# Patient Record
Sex: Female | Born: 1940 | Race: White | Hispanic: No | Marital: Married | Smoking: Former smoker
Health system: Southern US, Community
[De-identification: ages and names within clinical notes are randomized; demographics above are authoritative.]

## PROBLEM LIST (undated history)

## (undated) DIAGNOSIS — K5730 Diverticulosis of large intestine without perforation or abscess without bleeding: Secondary | ICD-10-CM

## (undated) DIAGNOSIS — K449 Diaphragmatic hernia without obstruction or gangrene: Secondary | ICD-10-CM

## (undated) DIAGNOSIS — M879 Osteonecrosis, unspecified: Secondary | ICD-10-CM

## (undated) DIAGNOSIS — R911 Solitary pulmonary nodule: Secondary | ICD-10-CM

## (undated) DIAGNOSIS — C3490 Malignant neoplasm of unspecified part of unspecified bronchus or lung: Secondary | ICD-10-CM

## (undated) DIAGNOSIS — A310 Pulmonary mycobacterial infection: Secondary | ICD-10-CM

## (undated) DIAGNOSIS — M797 Fibromyalgia: Secondary | ICD-10-CM

## (undated) DIAGNOSIS — Z8719 Personal history of other diseases of the digestive system: Secondary | ICD-10-CM

## (undated) DIAGNOSIS — K2970 Gastritis, unspecified, without bleeding: Secondary | ICD-10-CM

## (undated) DIAGNOSIS — K56609 Unspecified intestinal obstruction, unspecified as to partial versus complete obstruction: Secondary | ICD-10-CM

## (undated) DIAGNOSIS — Z800 Family history of malignant neoplasm of digestive organs: Secondary | ICD-10-CM

## (undated) DIAGNOSIS — F419 Anxiety disorder, unspecified: Secondary | ICD-10-CM

## (undated) DIAGNOSIS — M1990 Unspecified osteoarthritis, unspecified site: Secondary | ICD-10-CM

## (undated) DIAGNOSIS — I639 Cerebral infarction, unspecified: Secondary | ICD-10-CM

## (undated) DIAGNOSIS — F5104 Psychophysiologic insomnia: Secondary | ICD-10-CM

## (undated) DIAGNOSIS — K5792 Diverticulitis of intestine, part unspecified, without perforation or abscess without bleeding: Secondary | ICD-10-CM

## (undated) DIAGNOSIS — F32A Depression, unspecified: Secondary | ICD-10-CM

## (undated) DIAGNOSIS — B009 Herpesviral infection, unspecified: Secondary | ICD-10-CM

## (undated) DIAGNOSIS — K648 Other hemorrhoids: Secondary | ICD-10-CM

## (undated) DIAGNOSIS — K219 Gastro-esophageal reflux disease without esophagitis: Secondary | ICD-10-CM

## (undated) DIAGNOSIS — R002 Palpitations: Secondary | ICD-10-CM

## (undated) DIAGNOSIS — Q613 Polycystic kidney, unspecified: Secondary | ICD-10-CM

## (undated) DIAGNOSIS — M8580 Other specified disorders of bone density and structure, unspecified site: Secondary | ICD-10-CM

## (undated) DIAGNOSIS — N189 Chronic kidney disease, unspecified: Secondary | ICD-10-CM

## (undated) DIAGNOSIS — T07XXXA Unspecified multiple injuries, initial encounter: Secondary | ICD-10-CM

## (undated) DIAGNOSIS — E785 Hyperlipidemia, unspecified: Secondary | ICD-10-CM

## (undated) DIAGNOSIS — K589 Irritable bowel syndrome without diarrhea: Secondary | ICD-10-CM

## (undated) DIAGNOSIS — N3010 Interstitial cystitis (chronic) without hematuria: Secondary | ICD-10-CM

## (undated) DIAGNOSIS — M8700 Idiopathic aseptic necrosis of unspecified bone: Secondary | ICD-10-CM

## (undated) DIAGNOSIS — I10 Essential (primary) hypertension: Secondary | ICD-10-CM

## (undated) DIAGNOSIS — M810 Age-related osteoporosis without current pathological fracture: Secondary | ICD-10-CM

## (undated) HISTORY — DX: Irritable bowel syndrome, unspecified: K58.9

## (undated) HISTORY — DX: Age-related osteoporosis without current pathological fracture: M81.0

## (undated) HISTORY — DX: Fibromyalgia: M79.7

## (undated) HISTORY — DX: Interstitial cystitis (chronic) without hematuria: N30.10

## (undated) HISTORY — DX: Family history of malignant neoplasm of digestive organs: Z80.0

## (undated) HISTORY — DX: Essential (primary) hypertension: I10

## (undated) HISTORY — DX: Gastro-esophageal reflux disease without esophagitis: K21.9

## (undated) HISTORY — DX: Malignant neoplasm of unspecified part of unspecified bronchus or lung: C34.90

## (undated) HISTORY — PX: TOTAL HIP ARTHROPLASTY: SHX124

## (undated) HISTORY — DX: Unspecified intestinal obstruction, unspecified as to partial versus complete obstruction: K56.609

## (undated) HISTORY — DX: Hyperlipidemia, unspecified: E78.5

## (undated) HISTORY — DX: Other hemorrhoids: K64.8

## (undated) HISTORY — DX: Osteonecrosis, unspecified: M87.9

## (undated) HISTORY — DX: Personal history of other diseases of the digestive system: Z87.19

## (undated) HISTORY — DX: Cerebral infarction, unspecified: I63.9

## (undated) HISTORY — DX: Gastritis, unspecified, without bleeding: K29.70

## (undated) HISTORY — DX: Unspecified multiple injuries, initial encounter: T07.XXXA

## (undated) HISTORY — DX: Psychophysiologic insomnia: F51.04

## (undated) HISTORY — DX: Herpesviral infection, unspecified: B00.9

## (undated) HISTORY — DX: Diaphragmatic hernia without obstruction or gangrene: K44.9

## (undated) HISTORY — DX: Diverticulosis of large intestine without perforation or abscess without bleeding: K57.30

## (undated) HISTORY — PX: PELVIC LAPAROSCOPY: SHX162

## (undated) HISTORY — DX: Other specified disorders of bone density and structure, unspecified site: M85.80

## (undated) HISTORY — DX: Palpitations: R00.2

## (undated) HISTORY — DX: Polycystic kidney, unspecified: Q61.3

## (undated) HISTORY — PX: OTHER SURGICAL HISTORY: SHX169

## (undated) HISTORY — DX: Solitary pulmonary nodule: R91.1

## (undated) HISTORY — PX: CHOLECYSTECTOMY: SHX55

---

## 1992-08-01 HISTORY — PX: ABDOMINAL HYSTERECTOMY: SHX81

## 1992-08-01 HISTORY — PX: OOPHORECTOMY: SHX86

## 1998-12-21 ENCOUNTER — Encounter: Payer: Self-pay | Admitting: Gastroenterology

## 1998-12-21 ENCOUNTER — Other Ambulatory Visit: Admission: RE | Admit: 1998-12-21 | Discharge: 1998-12-21 | Payer: Self-pay | Admitting: Gastroenterology

## 1999-10-06 ENCOUNTER — Encounter (INDEPENDENT_AMBULATORY_CARE_PROVIDER_SITE_OTHER): Payer: Self-pay | Admitting: Specialist

## 1999-10-06 ENCOUNTER — Encounter: Payer: Self-pay | Admitting: Gastroenterology

## 1999-10-06 ENCOUNTER — Other Ambulatory Visit: Admission: RE | Admit: 1999-10-06 | Discharge: 1999-10-06 | Payer: Self-pay | Admitting: Gastroenterology

## 2004-12-01 ENCOUNTER — Ambulatory Visit: Payer: Self-pay | Admitting: Internal Medicine

## 2004-12-02 ENCOUNTER — Ambulatory Visit (HOSPITAL_COMMUNITY): Admission: RE | Admit: 2004-12-02 | Discharge: 2004-12-02 | Payer: Self-pay | Admitting: Internal Medicine

## 2005-04-12 ENCOUNTER — Ambulatory Visit: Payer: Self-pay | Admitting: Internal Medicine

## 2005-08-31 ENCOUNTER — Encounter: Payer: Self-pay | Admitting: Internal Medicine

## 2005-08-31 LAB — CONVERTED CEMR LAB

## 2005-09-29 ENCOUNTER — Ambulatory Visit: Payer: Self-pay | Admitting: Internal Medicine

## 2005-10-18 ENCOUNTER — Ambulatory Visit: Payer: Self-pay | Admitting: Internal Medicine

## 2005-10-28 ENCOUNTER — Ambulatory Visit: Payer: Self-pay | Admitting: Internal Medicine

## 2005-11-02 ENCOUNTER — Ambulatory Visit: Payer: Self-pay | Admitting: Internal Medicine

## 2005-11-26 ENCOUNTER — Ambulatory Visit: Payer: Self-pay | Admitting: Family Medicine

## 2005-12-15 ENCOUNTER — Ambulatory Visit: Payer: Self-pay | Admitting: Internal Medicine

## 2005-12-29 ENCOUNTER — Ambulatory Visit: Payer: Self-pay | Admitting: Internal Medicine

## 2005-12-29 ENCOUNTER — Encounter: Payer: Self-pay | Admitting: Gastroenterology

## 2006-01-02 ENCOUNTER — Ambulatory Visit: Payer: Self-pay | Admitting: Internal Medicine

## 2006-01-02 ENCOUNTER — Ambulatory Visit (HOSPITAL_COMMUNITY): Admission: RE | Admit: 2006-01-02 | Discharge: 2006-01-02 | Payer: Self-pay | Admitting: Internal Medicine

## 2006-02-06 ENCOUNTER — Ambulatory Visit: Payer: Self-pay | Admitting: Internal Medicine

## 2006-05-26 ENCOUNTER — Ambulatory Visit: Payer: Self-pay | Admitting: Internal Medicine

## 2006-05-26 LAB — CONVERTED CEMR LAB
ALT: 17 units/L (ref 0–40)
AST: 18 units/L (ref 0–37)
Albumin: 3.6 g/dL (ref 3.5–5.2)
Alkaline Phosphatase: 60 units/L (ref 39–117)
Bilirubin, Direct: 0.1 mg/dL (ref 0.0–0.3)
Chol/HDL Ratio, serum: 4.7
Cholesterol: 209 mg/dL (ref 0–200)
HDL: 44.8 mg/dL (ref >39.0)
LDL DIRECT: 139.9 mg/dL
Total Bilirubin: 0.8 mg/dL (ref 0.3–1.2)
Total Protein: 7 g/dL (ref 6.0–8.3)
Triglyceride fasting, serum: 103 mg/dL (ref 0–149)
VLDL: 21 mg/dL (ref 0–40)

## 2006-06-02 ENCOUNTER — Other Ambulatory Visit: Admission: RE | Admit: 2006-06-02 | Discharge: 2006-06-02 | Payer: Self-pay | Admitting: Obstetrics and Gynecology

## 2006-06-05 ENCOUNTER — Ambulatory Visit: Payer: Self-pay | Admitting: Internal Medicine

## 2006-06-11 ENCOUNTER — Ambulatory Visit: Admission: RE | Admit: 2006-06-11 | Discharge: 2006-06-11 | Payer: Self-pay | Admitting: Internal Medicine

## 2006-06-16 ENCOUNTER — Ambulatory Visit: Payer: Self-pay | Admitting: Internal Medicine

## 2006-08-01 HISTORY — PX: JOINT REPLACEMENT: SHX530

## 2006-09-26 ENCOUNTER — Ambulatory Visit: Payer: Self-pay | Admitting: Internal Medicine

## 2006-09-26 LAB — CONVERTED CEMR LAB
BUN: 15 mg/dL (ref 6–23)
CO2: 34 meq/L — ABNORMAL HIGH (ref 19–32)
Calcium: 9.5 mg/dL (ref 8.4–10.5)
Chloride: 106 meq/L (ref 96–112)
Cholesterol: 231 mg/dL (ref 0–200)
Creatinine, Ser: 1.1 mg/dL (ref 0.4–1.2)
Direct LDL: 171.2 mg/dL
GFR calc Af Amer: 64 mL/min
GFR calc non Af Amer: 53 mL/min
Glucose, Bld: 92 mg/dL (ref 70–99)
HDL: 41.4 mg/dL (ref >39.0)
Potassium: 4.4 meq/L (ref 3.5–5.1)
Sodium: 145 meq/L (ref 135–145)
Total CHOL/HDL Ratio: 5.6
Triglycerides: 123 mg/dL (ref 0–149)
VLDL: 25 mg/dL (ref 0–40)

## 2006-09-28 ENCOUNTER — Ambulatory Visit: Payer: Self-pay | Admitting: Internal Medicine

## 2006-11-03 ENCOUNTER — Ambulatory Visit: Payer: Self-pay | Admitting: Internal Medicine

## 2006-12-11 ENCOUNTER — Ambulatory Visit: Payer: Self-pay | Admitting: Internal Medicine

## 2006-12-11 LAB — CONVERTED CEMR LAB
ALT: 18 units/L (ref 0–40)
AST: 19 units/L (ref 0–37)
Albumin: 3.6 g/dL (ref 3.5–5.2)
Alkaline Phosphatase: 48 units/L (ref 39–117)
BUN: 17 mg/dL (ref 6–23)
Bilirubin, Direct: 0.1 mg/dL (ref 0.0–0.3)
CO2: 32 meq/L (ref 19–32)
Calcium: 9.1 mg/dL (ref 8.4–10.5)
Chloride: 108 meq/L (ref 96–112)
Cholesterol: 146 mg/dL (ref 0–200)
Creatinine, Ser: 1.1 mg/dL (ref 0.4–1.2)
Free T4: 0.7 ng/dL (ref 0.6–1.6)
GFR calc Af Amer: 64 mL/min
GFR calc non Af Amer: 53 mL/min
Glucose, Bld: 94 mg/dL (ref 70–99)
HDL: 41.6 mg/dL (ref >39.0)
LDL Cholesterol: 79 mg/dL (ref 0–99)
Potassium: 3.9 meq/L (ref 3.5–5.1)
Sodium: 144 meq/L (ref 135–145)
T3, Free: 3 pg/mL (ref 2.3–4.2)
TSH: 3.27 microintl units/mL
TSH: 3.27 microintl units/mL (ref 0.35–5.50)
Total Bilirubin: 0.6 mg/dL (ref 0.3–1.2)
Total CHOL/HDL Ratio: 3.5
Total Protein: 6.5 g/dL (ref 6.0–8.3)
Triglycerides: 125 mg/dL (ref 0–149)
VLDL: 25 mg/dL (ref 0–40)

## 2006-12-14 ENCOUNTER — Ambulatory Visit: Payer: Self-pay | Admitting: Internal Medicine

## 2007-02-19 ENCOUNTER — Emergency Department (HOSPITAL_COMMUNITY): Admission: EM | Admit: 2007-02-19 | Discharge: 2007-02-19 | Payer: Self-pay | Admitting: Emergency Medicine

## 2007-02-21 ENCOUNTER — Inpatient Hospital Stay (HOSPITAL_COMMUNITY): Admission: AD | Admit: 2007-02-21 | Discharge: 2007-03-01 | Payer: Self-pay | Admitting: Orthopedic Surgery

## 2007-02-22 ENCOUNTER — Encounter (INDEPENDENT_AMBULATORY_CARE_PROVIDER_SITE_OTHER): Payer: Self-pay | Admitting: Orthopedic Surgery

## 2007-02-23 ENCOUNTER — Ambulatory Visit: Payer: Self-pay | Admitting: Physical Medicine & Rehabilitation

## 2007-03-13 ENCOUNTER — Ambulatory Visit: Payer: Self-pay | Admitting: Cardiology

## 2007-03-19 ENCOUNTER — Ambulatory Visit: Payer: Self-pay | Admitting: Internal Medicine

## 2007-03-19 LAB — CONVERTED CEMR LAB
Basophils Absolute: 0 10*3/uL (ref 0.0–0.1)
Basophils Relative: 0.3 % (ref 0.0–1.0)
Eosinophils Absolute: 0.1 10*3/uL (ref 0.0–0.6)
Eosinophils Relative: 1.1 % (ref 0.0–5.0)
HCT: 35.5 % — ABNORMAL LOW (ref 36.0–46.0)
Hemoglobin: 12.1 g/dL (ref 12.0–15.0)
Lymphocytes Relative: 25.3 % (ref 12.0–46.0)
MCHC: 34.1 g/dL (ref 30.0–36.0)
MCV: 87.3 fL (ref 78.0–100.0)
Monocytes Absolute: 0.4 10*3/uL (ref 0.2–0.7)
Monocytes Relative: 6.1 % (ref 3.0–11.0)
Neutro Abs: 4.7 10*3/uL (ref 1.4–7.7)
Neutrophils Relative %: 67.2 % (ref 43.0–77.0)
Platelets: 283 10*3/uL (ref 150–400)
RBC: 4.06 M/uL (ref 3.87–5.11)
RDW: 13.8 % (ref 11.5–14.6)
WBC: 6.9 10*3/uL (ref 4.5–10.5)

## 2007-03-21 ENCOUNTER — Ambulatory Visit: Payer: Self-pay | Admitting: Internal Medicine

## 2007-03-27 ENCOUNTER — Ambulatory Visit: Payer: Self-pay | Admitting: Cardiology

## 2007-04-07 ENCOUNTER — Encounter: Payer: Self-pay | Admitting: Internal Medicine

## 2007-05-25 ENCOUNTER — Ambulatory Visit: Payer: Self-pay | Admitting: Internal Medicine

## 2007-05-25 LAB — CONVERTED CEMR LAB
Cholesterol: 154 mg/dL (ref 0–200)
HDL: 59.8 mg/dL (ref >39.0)
LDL Cholesterol: 78 mg/dL (ref 0–99)
Total CHOL/HDL Ratio: 2.6
Triglycerides: 81 mg/dL (ref 0–149)
VLDL: 16 mg/dL (ref 0–40)
Vit D, 1,25-Dihydroxy: 46 (ref 30–89)

## 2007-05-28 ENCOUNTER — Encounter: Payer: Self-pay | Admitting: Internal Medicine

## 2007-05-28 ENCOUNTER — Ambulatory Visit: Payer: Self-pay | Admitting: Internal Medicine

## 2007-05-28 DIAGNOSIS — I10 Essential (primary) hypertension: Secondary | ICD-10-CM | POA: Insufficient documentation

## 2007-05-28 DIAGNOSIS — E782 Mixed hyperlipidemia: Secondary | ICD-10-CM | POA: Insufficient documentation

## 2007-06-12 ENCOUNTER — Telehealth: Payer: Self-pay | Admitting: Internal Medicine

## 2007-07-02 DIAGNOSIS — R911 Solitary pulmonary nodule: Secondary | ICD-10-CM

## 2007-07-02 HISTORY — DX: Solitary pulmonary nodule: R91.1

## 2007-07-30 ENCOUNTER — Encounter: Payer: Self-pay | Admitting: Internal Medicine

## 2007-07-30 ENCOUNTER — Encounter: Admission: RE | Admit: 2007-07-30 | Discharge: 2007-07-30 | Payer: Self-pay | Admitting: Orthopedic Surgery

## 2007-08-06 ENCOUNTER — Encounter: Payer: Self-pay | Admitting: Internal Medicine

## 2007-08-07 ENCOUNTER — Telehealth: Payer: Self-pay | Admitting: Internal Medicine

## 2007-08-09 ENCOUNTER — Ambulatory Visit: Payer: Self-pay | Admitting: Internal Medicine

## 2007-08-09 DIAGNOSIS — R918 Other nonspecific abnormal finding of lung field: Secondary | ICD-10-CM | POA: Insufficient documentation

## 2007-08-13 ENCOUNTER — Telehealth: Payer: Self-pay | Admitting: Internal Medicine

## 2007-08-20 LAB — CONVERTED CEMR LAB: Pap Smear: NORMAL

## 2007-08-30 ENCOUNTER — Other Ambulatory Visit: Admission: RE | Admit: 2007-08-30 | Discharge: 2007-08-30 | Payer: Self-pay | Admitting: Obstetrics and Gynecology

## 2007-09-13 ENCOUNTER — Ambulatory Visit: Payer: Self-pay | Admitting: Internal Medicine

## 2007-09-13 DIAGNOSIS — K219 Gastro-esophageal reflux disease without esophagitis: Secondary | ICD-10-CM | POA: Insufficient documentation

## 2007-09-14 ENCOUNTER — Telehealth: Payer: Self-pay | Admitting: Internal Medicine

## 2007-10-11 ENCOUNTER — Encounter: Payer: Self-pay | Admitting: Internal Medicine

## 2007-10-29 ENCOUNTER — Ambulatory Visit (HOSPITAL_BASED_OUTPATIENT_CLINIC_OR_DEPARTMENT_OTHER): Admission: RE | Admit: 2007-10-29 | Discharge: 2007-10-30 | Payer: Self-pay | Admitting: Orthopedic Surgery

## 2007-11-26 ENCOUNTER — Encounter: Payer: Self-pay | Admitting: Internal Medicine

## 2007-11-28 ENCOUNTER — Encounter: Payer: Self-pay | Admitting: Internal Medicine

## 2007-11-29 ENCOUNTER — Telehealth: Payer: Self-pay | Admitting: Internal Medicine

## 2007-11-29 DIAGNOSIS — R5381 Other malaise: Secondary | ICD-10-CM | POA: Insufficient documentation

## 2007-11-29 DIAGNOSIS — R5383 Other fatigue: Secondary | ICD-10-CM

## 2007-12-04 ENCOUNTER — Ambulatory Visit: Payer: Self-pay | Admitting: Internal Medicine

## 2007-12-04 LAB — CONVERTED CEMR LAB
ALT: 16 units/L (ref 0–35)
AST: 16 units/L (ref 0–37)
BUN: 25 mg/dL — ABNORMAL HIGH (ref 6–23)
Basophils Absolute: 0.1 10*3/uL (ref 0.0–0.1)
Basophils Relative: 0.8 % (ref 0.0–1.0)
CO2: 29 meq/L (ref 19–32)
Calcium: 9 mg/dL (ref 8.4–10.5)
Chloride: 108 meq/L (ref 96–112)
Cholesterol: 135 mg/dL (ref 0–200)
Creatinine, Ser: 1.1 mg/dL (ref 0.4–1.2)
Eosinophils Absolute: 0.1 10*3/uL (ref 0.0–0.7)
Eosinophils Relative: 1.8 % (ref 0.0–5.0)
GFR calc Af Amer: 64 mL/min
GFR calc non Af Amer: 53 mL/min
Glucose, Bld: 95 mg/dL (ref 70–99)
HCT: 39.1 % (ref 36.0–46.0)
HDL: 44.7 mg/dL (ref >39.0)
Hemoglobin: 13 g/dL (ref 12.0–15.0)
LDL Cholesterol: 72 mg/dL (ref 0–99)
Lymphocytes Relative: 30.7 % (ref 12.0–46.0)
MCHC: 33.2 g/dL (ref 30.0–36.0)
MCV: 90 fL (ref 78.0–100.0)
Monocytes Absolute: 0.5 10*3/uL (ref 0.1–1.0)
Monocytes Relative: 7.3 % (ref 3.0–12.0)
Neutro Abs: 3.7 10*3/uL (ref 1.4–7.7)
Neutrophils Relative %: 59.4 % (ref 43.0–77.0)
Platelets: 186 10*3/uL (ref 150–400)
Potassium: 4.4 meq/L (ref 3.5–5.1)
RBC: 4.34 M/uL (ref 3.87–5.11)
RDW: 12.9 % (ref 11.5–14.6)
Sodium: 144 meq/L (ref 135–145)
TSH: 1.81 microintl units/mL (ref 0.35–5.50)
Total CHOL/HDL Ratio: 3
Triglycerides: 93 mg/dL (ref 0–149)
VLDL: 19 mg/dL (ref 0–40)
WBC: 6.3 10*3/uL (ref 4.5–10.5)

## 2007-12-05 ENCOUNTER — Ambulatory Visit: Payer: Self-pay | Admitting: Internal Medicine

## 2007-12-05 DIAGNOSIS — M797 Fibromyalgia: Secondary | ICD-10-CM | POA: Insufficient documentation

## 2008-01-16 ENCOUNTER — Ambulatory Visit: Payer: Self-pay | Admitting: Internal Medicine

## 2008-01-16 ENCOUNTER — Telehealth: Payer: Self-pay | Admitting: Internal Medicine

## 2008-01-16 DIAGNOSIS — R350 Frequency of micturition: Secondary | ICD-10-CM | POA: Insufficient documentation

## 2008-01-16 LAB — CONVERTED CEMR LAB
Bacteria, UA: NEGATIVE
Bilirubin Urine: NEGATIVE
Crystals: NEGATIVE
Hemoglobin, Urine: NEGATIVE
Ketones, ur: NEGATIVE mg/dL
Nitrite: NEGATIVE
RBC / HPF: NONE SEEN
Sed Rate: 9 mm/hr (ref 0–22)
Specific Gravity, Urine: 1.01 (ref 1.000–1.03)
Total CK: 61 units/L (ref 7–177)
Total Protein, Urine: NEGATIVE mg/dL
Urine Glucose: NEGATIVE mg/dL
Urobilinogen, UA: 0.2 (ref 0.0–1.0)
pH: 7.5 (ref 5.0–8.0)

## 2008-01-18 ENCOUNTER — Ambulatory Visit: Payer: Self-pay | Admitting: Internal Medicine

## 2008-01-20 ENCOUNTER — Telehealth: Payer: Self-pay | Admitting: Internal Medicine

## 2008-01-22 ENCOUNTER — Telehealth: Payer: Self-pay | Admitting: Internal Medicine

## 2008-01-23 ENCOUNTER — Telehealth: Payer: Self-pay | Admitting: Internal Medicine

## 2008-02-08 ENCOUNTER — Ambulatory Visit: Payer: Self-pay | Admitting: Internal Medicine

## 2008-02-08 DIAGNOSIS — F32A Depression, unspecified: Secondary | ICD-10-CM | POA: Insufficient documentation

## 2008-02-08 DIAGNOSIS — F329 Major depressive disorder, single episode, unspecified: Secondary | ICD-10-CM

## 2008-02-13 ENCOUNTER — Telehealth: Payer: Self-pay | Admitting: Internal Medicine

## 2008-02-22 ENCOUNTER — Telehealth (INDEPENDENT_AMBULATORY_CARE_PROVIDER_SITE_OTHER): Payer: Self-pay | Admitting: Emergency Medicine

## 2008-02-25 ENCOUNTER — Ambulatory Visit: Payer: Self-pay | Admitting: Internal Medicine

## 2008-02-25 DIAGNOSIS — R002 Palpitations: Secondary | ICD-10-CM | POA: Insufficient documentation

## 2008-03-26 ENCOUNTER — Ambulatory Visit: Payer: Self-pay | Admitting: Internal Medicine

## 2008-03-26 LAB — CONVERTED CEMR LAB
BUN: 17 mg/dL (ref 6–23)
CO2: 33 meq/L — ABNORMAL HIGH (ref 19–32)
Calcium: 9.3 mg/dL (ref 8.4–10.5)
Chloride: 110 meq/L (ref 96–112)
Creatinine, Ser: 1.2 mg/dL (ref 0.4–1.2)
GFR calc Af Amer: 58 mL/min
GFR calc non Af Amer: 48 mL/min
Glucose, Bld: 89 mg/dL (ref 70–99)
Potassium: 4.9 meq/L (ref 3.5–5.1)
Sodium: 144 meq/L (ref 135–145)
T3 Uptake Ratio: 38.1 % — ABNORMAL HIGH (ref 22.5–37.0)
T3, Free: 2.8 pg/mL (ref 2.3–4.2)
T4, Total: 6.5 ug/dL (ref 5.0–12.5)

## 2008-03-27 ENCOUNTER — Ambulatory Visit: Payer: Self-pay | Admitting: Internal Medicine

## 2008-03-27 DIAGNOSIS — J310 Chronic rhinitis: Secondary | ICD-10-CM | POA: Insufficient documentation

## 2008-03-27 LAB — CONVERTED CEMR LAB
Bilirubin Urine: NEGATIVE
Glucose, Urine, Semiquant: NEGATIVE
Ketones, urine, test strip: NEGATIVE
Nitrite: NEGATIVE
Specific Gravity, Urine: 1.015
Urobilinogen, UA: 0.2
pH: 6

## 2008-05-14 ENCOUNTER — Encounter: Payer: Self-pay | Admitting: Internal Medicine

## 2008-05-21 ENCOUNTER — Ambulatory Visit: Payer: Self-pay | Admitting: Internal Medicine

## 2008-05-21 ENCOUNTER — Encounter: Payer: Self-pay | Admitting: Internal Medicine

## 2008-05-22 ENCOUNTER — Ambulatory Visit: Payer: Self-pay

## 2008-05-22 ENCOUNTER — Encounter: Payer: Self-pay | Admitting: Internal Medicine

## 2008-07-02 ENCOUNTER — Ambulatory Visit: Payer: Self-pay | Admitting: *Deleted

## 2008-07-02 DIAGNOSIS — N3000 Acute cystitis without hematuria: Secondary | ICD-10-CM | POA: Insufficient documentation

## 2008-07-02 DIAGNOSIS — K589 Irritable bowel syndrome without diarrhea: Secondary | ICD-10-CM | POA: Insufficient documentation

## 2008-07-02 LAB — CONVERTED CEMR LAB
Bilirubin Urine: NEGATIVE
Glucose, Urine, Semiquant: NEGATIVE
Ketones, urine, test strip: NEGATIVE
Nitrite: NEGATIVE
Protein, U semiquant: NEGATIVE
Specific Gravity, Urine: 1.01
Urobilinogen, UA: 0.2
pH: 5

## 2008-07-04 ENCOUNTER — Ambulatory Visit (HOSPITAL_BASED_OUTPATIENT_CLINIC_OR_DEPARTMENT_OTHER): Admission: RE | Admit: 2008-07-04 | Discharge: 2008-07-04 | Payer: Self-pay | Admitting: *Deleted

## 2008-07-04 ENCOUNTER — Ambulatory Visit: Payer: Self-pay | Admitting: Diagnostic Radiology

## 2008-07-04 DIAGNOSIS — R319 Hematuria, unspecified: Secondary | ICD-10-CM | POA: Insufficient documentation

## 2008-07-04 DIAGNOSIS — R109 Unspecified abdominal pain: Secondary | ICD-10-CM | POA: Insufficient documentation

## 2008-07-07 ENCOUNTER — Ambulatory Visit: Payer: Self-pay | Admitting: *Deleted

## 2008-07-08 ENCOUNTER — Ambulatory Visit: Payer: Self-pay | Admitting: Cardiology

## 2008-07-09 DIAGNOSIS — D134 Benign neoplasm of liver: Secondary | ICD-10-CM | POA: Insufficient documentation

## 2008-07-09 DIAGNOSIS — N281 Cyst of kidney, acquired: Secondary | ICD-10-CM | POA: Insufficient documentation

## 2008-07-09 DIAGNOSIS — D135 Benign neoplasm of extrahepatic bile ducts: Secondary | ICD-10-CM

## 2008-07-09 LAB — CONVERTED CEMR LAB
ALT: 26 units/L (ref 0–35)
AST: 27 units/L (ref 0–37)
Albumin: 3.7 g/dL (ref 3.5–5.2)
Alkaline Phosphatase: 53 units/L (ref 39–117)
Amylase: 143 units/L — ABNORMAL HIGH (ref 27–131)
BUN: 21 mg/dL (ref 6–23)
Basophils Absolute: 0 10*3/uL (ref 0.0–0.1)
Basophils Relative: 0.8 % (ref 0.0–3.0)
CO2: 31 meq/L (ref 19–32)
Calcium: 10 mg/dL (ref 8.4–10.5)
Chloride: 104 meq/L (ref 96–112)
Creatinine, Ser: 1.2 mg/dL (ref 0.4–1.2)
Eosinophils Absolute: 0.1 10*3/uL (ref 0.0–0.7)
Eosinophils Relative: 1 % (ref 0.0–5.0)
GFR calc Af Amer: 58 mL/min
GFR calc non Af Amer: 48 mL/min
Glucose, Bld: 86 mg/dL (ref 70–99)
HCT: 38.5 % (ref 36.0–46.0)
Hemoglobin: 13.1 g/dL (ref 12.0–15.0)
Lipase: 33 units/L (ref 11.0–59.0)
Lymphocytes Relative: 28.3 % (ref 12.0–46.0)
MCHC: 33.9 g/dL (ref 30.0–36.0)
MCV: 90.1 fL (ref 78.0–100.0)
Monocytes Absolute: 0.4 10*3/uL (ref 0.1–1.0)
Monocytes Relative: 6.1 % (ref 3.0–12.0)
Neutro Abs: 3.7 10*3/uL (ref 1.4–7.7)
Neutrophils Relative %: 63.8 % (ref 43.0–77.0)
Platelets: 219 10*3/uL (ref 150–400)
Potassium: 5 meq/L (ref 3.5–5.1)
RBC: 4.27 M/uL (ref 3.87–5.11)
RDW: 12 % (ref 11.5–14.6)
Sodium: 141 meq/L (ref 135–145)
Total Bilirubin: 0.6 mg/dL (ref 0.3–1.2)
Total Protein: 7.1 g/dL (ref 6.0–8.3)
WBC: 5.9 10*3/uL (ref 4.5–10.5)

## 2008-08-15 ENCOUNTER — Encounter: Payer: Self-pay | Admitting: Internal Medicine

## 2008-08-15 ENCOUNTER — Encounter: Payer: Self-pay | Admitting: Gastroenterology

## 2008-08-16 ENCOUNTER — Encounter: Admission: RE | Admit: 2008-08-16 | Discharge: 2008-08-16 | Payer: Self-pay | Admitting: Nephrology

## 2008-09-11 ENCOUNTER — Encounter (INDEPENDENT_AMBULATORY_CARE_PROVIDER_SITE_OTHER): Payer: Self-pay | Admitting: *Deleted

## 2008-09-16 ENCOUNTER — Ambulatory Visit: Payer: Self-pay | Admitting: Internal Medicine

## 2008-09-16 DIAGNOSIS — Q613 Polycystic kidney, unspecified: Secondary | ICD-10-CM | POA: Insufficient documentation

## 2008-09-16 DIAGNOSIS — R51 Headache: Secondary | ICD-10-CM | POA: Insufficient documentation

## 2008-09-16 DIAGNOSIS — R519 Headache, unspecified: Secondary | ICD-10-CM | POA: Insufficient documentation

## 2008-09-19 ENCOUNTER — Encounter: Admission: RE | Admit: 2008-09-19 | Discharge: 2008-09-19 | Payer: Self-pay | Admitting: Internal Medicine

## 2008-09-22 ENCOUNTER — Telehealth: Payer: Self-pay | Admitting: Internal Medicine

## 2008-09-29 ENCOUNTER — Emergency Department (HOSPITAL_COMMUNITY): Admission: EM | Admit: 2008-09-29 | Discharge: 2008-09-29 | Payer: Self-pay | Admitting: Family Medicine

## 2008-10-08 DIAGNOSIS — G4700 Insomnia, unspecified: Secondary | ICD-10-CM | POA: Insufficient documentation

## 2008-10-08 DIAGNOSIS — K449 Diaphragmatic hernia without obstruction or gangrene: Secondary | ICD-10-CM

## 2008-10-08 DIAGNOSIS — D649 Anemia, unspecified: Secondary | ICD-10-CM | POA: Insufficient documentation

## 2008-10-08 DIAGNOSIS — K649 Unspecified hemorrhoids: Secondary | ICD-10-CM | POA: Insufficient documentation

## 2008-10-08 DIAGNOSIS — N3090 Cystitis, unspecified without hematuria: Secondary | ICD-10-CM | POA: Insufficient documentation

## 2008-10-08 DIAGNOSIS — K5730 Diverticulosis of large intestine without perforation or abscess without bleeding: Secondary | ICD-10-CM | POA: Insufficient documentation

## 2008-10-08 HISTORY — DX: Diaphragmatic hernia without obstruction or gangrene: K44.9

## 2008-10-09 ENCOUNTER — Ambulatory Visit: Payer: Self-pay | Admitting: Gastroenterology

## 2008-11-13 ENCOUNTER — Telehealth: Payer: Self-pay | Admitting: Internal Medicine

## 2008-11-21 ENCOUNTER — Ambulatory Visit: Payer: Self-pay | Admitting: Gastroenterology

## 2008-12-05 ENCOUNTER — Other Ambulatory Visit: Admission: RE | Admit: 2008-12-05 | Discharge: 2008-12-05 | Payer: Self-pay | Admitting: Obstetrics and Gynecology

## 2008-12-05 ENCOUNTER — Encounter: Payer: Self-pay | Admitting: Gastroenterology

## 2008-12-05 ENCOUNTER — Ambulatory Visit: Payer: Self-pay | Admitting: Gastroenterology

## 2008-12-05 ENCOUNTER — Encounter: Payer: Self-pay | Admitting: Women's Health

## 2008-12-05 ENCOUNTER — Ambulatory Visit: Payer: Self-pay | Admitting: Women's Health

## 2008-12-05 DIAGNOSIS — K2970 Gastritis, unspecified, without bleeding: Secondary | ICD-10-CM | POA: Insufficient documentation

## 2008-12-05 DIAGNOSIS — K2990 Gastroduodenitis, unspecified, without bleeding: Secondary | ICD-10-CM

## 2008-12-05 LAB — CONVERTED CEMR LAB: UREASE: NEGATIVE

## 2008-12-09 ENCOUNTER — Encounter: Payer: Self-pay | Admitting: Gastroenterology

## 2008-12-12 ENCOUNTER — Telehealth: Payer: Self-pay | Admitting: Gastroenterology

## 2008-12-23 ENCOUNTER — Ambulatory Visit: Payer: Self-pay | Admitting: Obstetrics and Gynecology

## 2008-12-23 ENCOUNTER — Encounter: Payer: Self-pay | Admitting: Internal Medicine

## 2009-01-20 LAB — CONVERTED CEMR LAB: Pap Smear: NORMAL

## 2009-02-06 ENCOUNTER — Telehealth: Payer: Self-pay | Admitting: Internal Medicine

## 2009-03-05 ENCOUNTER — Telehealth: Payer: Self-pay | Admitting: Internal Medicine

## 2009-03-10 ENCOUNTER — Ambulatory Visit: Payer: Self-pay | Admitting: Internal Medicine

## 2009-03-10 DIAGNOSIS — D696 Thrombocytopenia, unspecified: Secondary | ICD-10-CM | POA: Insufficient documentation

## 2009-03-10 LAB — CONVERTED CEMR LAB
ALT: 21 units/L (ref 0–35)
AST: 19 units/L (ref 0–37)
Albumin: 3.9 g/dL (ref 3.5–5.2)
Alkaline Phosphatase: 51 units/L (ref 39–117)
BUN: 24 mg/dL — ABNORMAL HIGH (ref 6–23)
Basophils Absolute: 0 10*3/uL (ref 0.0–0.1)
Basophils Relative: 0.7 % (ref 0.0–3.0)
Bilirubin Urine: NEGATIVE
Bilirubin, Direct: 0.1 mg/dL (ref 0.0–0.3)
CO2: 33 meq/L — ABNORMAL HIGH (ref 19–32)
Calcium: 9.5 mg/dL (ref 8.4–10.5)
Chloride: 107 meq/L (ref 96–112)
Cholesterol: 145 mg/dL (ref 0–200)
Creatinine, Ser: 1.3 mg/dL — ABNORMAL HIGH (ref 0.4–1.2)
Eosinophils Absolute: 0.1 10*3/uL (ref 0.0–0.7)
Eosinophils Relative: 1.4 % (ref 0.0–5.0)
GFR calc non Af Amer: 43.44 mL/min (ref >60)
Glucose, Bld: 89 mg/dL (ref 70–99)
HCT: 37 % (ref 36.0–46.0)
HDL: 47.6 mg/dL (ref >39.00)
Hemoglobin: 13 g/dL (ref 12.0–15.0)
Ketones, ur: NEGATIVE mg/dL
LDL Cholesterol: 76 mg/dL (ref 0–99)
Lymphocytes Relative: 31.6 % (ref 12.0–46.0)
Lymphs Abs: 1.6 10*3/uL (ref 0.7–4.0)
MCHC: 35 g/dL (ref 30.0–36.0)
MCV: 87.1 fL (ref 78.0–100.0)
Magnesium: 2.3 mg/dL (ref 1.5–2.5)
Monocytes Absolute: 0.3 10*3/uL (ref 0.1–1.0)
Monocytes Relative: 5.5 % (ref 3.0–12.0)
Neutro Abs: 3.1 10*3/uL (ref 1.4–7.7)
Neutrophils Relative %: 60.8 % (ref 43.0–77.0)
Nitrite: NEGATIVE
Phosphorus: 3.9 mg/dL (ref 2.3–4.6)
Platelets: 141 10*3/uL — ABNORMAL LOW (ref 150.0–400.0)
Potassium: 4.3 meq/L (ref 3.5–5.1)
RBC: 4.25 M/uL (ref 3.87–5.11)
RDW: 13.6 % (ref 11.5–14.6)
Sodium: 144 meq/L (ref 135–145)
Specific Gravity, Urine: 1.02 (ref 1.000–1.030)
TSH: 2.09 microintl units/mL (ref 0.35–5.50)
Total Bilirubin: 0.7 mg/dL (ref 0.3–1.2)
Total CHOL/HDL Ratio: 3
Total Protein, Urine: NEGATIVE mg/dL
Total Protein: 7.2 g/dL (ref 6.0–8.3)
Triglycerides: 107 mg/dL (ref 0.0–149.0)
Urine Glucose: NEGATIVE mg/dL
Urobilinogen, UA: 0.2 (ref 0.0–1.0)
VLDL: 21.4 mg/dL (ref 0.0–40.0)
Vit D, 1,25-Dihydroxy: 37 (ref 30–89)
Vit D, 25-Hydroxy: 42 ng/mL (ref 30–89)
WBC: 5.1 10*3/uL (ref 4.5–10.5)
pH: 6 (ref 5.0–8.0)

## 2009-03-18 ENCOUNTER — Ambulatory Visit (HOSPITAL_COMMUNITY): Admission: RE | Admit: 2009-03-18 | Discharge: 2009-03-18 | Payer: Self-pay | Admitting: Internal Medicine

## 2009-03-20 ENCOUNTER — Encounter: Payer: Self-pay | Admitting: Internal Medicine

## 2009-03-23 ENCOUNTER — Encounter: Payer: Self-pay | Admitting: Internal Medicine

## 2009-04-02 ENCOUNTER — Telehealth: Payer: Self-pay | Admitting: Internal Medicine

## 2009-04-02 ENCOUNTER — Ambulatory Visit: Payer: Self-pay | Admitting: Internal Medicine

## 2009-04-02 ENCOUNTER — Ambulatory Visit: Payer: Self-pay | Admitting: Diagnostic Radiology

## 2009-04-02 ENCOUNTER — Ambulatory Visit (HOSPITAL_BASED_OUTPATIENT_CLINIC_OR_DEPARTMENT_OTHER): Admission: RE | Admit: 2009-04-02 | Discharge: 2009-04-02 | Payer: Self-pay | Admitting: Internal Medicine

## 2009-04-02 DIAGNOSIS — M25579 Pain in unspecified ankle and joints of unspecified foot: Secondary | ICD-10-CM | POA: Insufficient documentation

## 2009-04-10 ENCOUNTER — Encounter: Payer: Self-pay | Admitting: Internal Medicine

## 2009-04-22 ENCOUNTER — Ambulatory Visit: Payer: Self-pay | Admitting: Internal Medicine

## 2009-04-22 DIAGNOSIS — N951 Menopausal and female climacteric states: Secondary | ICD-10-CM | POA: Insufficient documentation

## 2009-05-13 ENCOUNTER — Encounter: Payer: Self-pay | Admitting: Internal Medicine

## 2009-06-12 ENCOUNTER — Telehealth: Payer: Self-pay | Admitting: Internal Medicine

## 2009-08-01 HISTORY — PX: GASTROPLASTY: SHX192

## 2009-08-01 HISTORY — PX: ABDOMINAL SURGERY: SHX537

## 2009-08-01 HISTORY — PX: OTHER SURGICAL HISTORY: SHX169

## 2009-08-10 ENCOUNTER — Ambulatory Visit: Payer: Self-pay | Admitting: Internal Medicine

## 2009-08-10 DIAGNOSIS — J018 Other acute sinusitis: Secondary | ICD-10-CM | POA: Insufficient documentation

## 2009-08-12 ENCOUNTER — Telehealth: Payer: Self-pay | Admitting: Internal Medicine

## 2009-10-13 ENCOUNTER — Telehealth (INDEPENDENT_AMBULATORY_CARE_PROVIDER_SITE_OTHER): Payer: Self-pay | Admitting: *Deleted

## 2009-11-16 ENCOUNTER — Ambulatory Visit: Payer: Self-pay | Admitting: Internal Medicine

## 2009-11-16 LAB — CONVERTED CEMR LAB
BUN: 26 mg/dL — ABNORMAL HIGH (ref 6–23)
Basophils Absolute: 0 10*3/uL (ref 0.0–0.1)
Basophils Relative: 1 % (ref 0–1)
CO2: 25 meq/L (ref 19–32)
Calcium: 10.1 mg/dL (ref 8.4–10.5)
Chloride: 104 meq/L (ref 96–112)
Creatinine, Ser: 1.24 mg/dL — ABNORMAL HIGH (ref 0.40–1.20)
Eosinophils Absolute: 0.1 10*3/uL (ref 0.0–0.7)
Eosinophils Relative: 2 % (ref 0–5)
Glucose, Bld: 87 mg/dL (ref 70–99)
HCT: 40.6 % (ref 36.0–46.0)
Hemoglobin: 12.8 g/dL (ref 12.0–15.0)
Lymphocytes Relative: 38 % (ref 12–46)
Lymphs Abs: 2.4 10*3/uL (ref 0.7–4.0)
MCHC: 31.5 g/dL (ref 30.0–36.0)
MCV: 88.5 fL (ref 78.0–100.0)
Monocytes Absolute: 0.3 10*3/uL (ref 0.1–1.0)
Monocytes Relative: 5 % (ref 3–12)
Neutro Abs: 3.5 10*3/uL (ref 1.7–7.7)
Neutrophils Relative %: 55 % (ref 43–77)
Platelets: 216 10*3/uL (ref 150–400)
Potassium: 5.2 meq/L (ref 3.5–5.3)
RBC: 4.59 M/uL (ref 3.87–5.11)
RDW: 15.7 % — ABNORMAL HIGH (ref 11.5–15.5)
Sodium: 141 meq/L (ref 135–145)
WBC: 6.4 10*3/uL (ref 4.0–10.5)

## 2009-11-17 ENCOUNTER — Encounter: Payer: Self-pay | Admitting: Internal Medicine

## 2009-11-26 ENCOUNTER — Telehealth: Payer: Self-pay | Admitting: Internal Medicine

## 2009-12-21 ENCOUNTER — Ambulatory Visit: Payer: Self-pay | Admitting: Internal Medicine

## 2009-12-21 DIAGNOSIS — M810 Age-related osteoporosis without current pathological fracture: Secondary | ICD-10-CM | POA: Insufficient documentation

## 2009-12-22 LAB — CONVERTED CEMR LAB
Calcium, Total (PTH): 10.3 mg/dL (ref 8.4–10.5)
PTH: 15.5 pg/mL (ref 14.0–72.0)

## 2009-12-29 ENCOUNTER — Encounter: Payer: Self-pay | Admitting: Internal Medicine

## 2009-12-31 ENCOUNTER — Telehealth: Payer: Self-pay | Admitting: Internal Medicine

## 2010-02-10 ENCOUNTER — Encounter: Payer: Self-pay | Admitting: Internal Medicine

## 2010-02-11 ENCOUNTER — Telehealth: Payer: Self-pay | Admitting: Internal Medicine

## 2010-02-11 DIAGNOSIS — R059 Cough, unspecified: Secondary | ICD-10-CM | POA: Insufficient documentation

## 2010-02-11 DIAGNOSIS — R05 Cough: Secondary | ICD-10-CM

## 2010-02-12 ENCOUNTER — Ambulatory Visit: Payer: Self-pay | Admitting: Endocrinology

## 2010-02-12 LAB — CONVERTED CEMR LAB: TSH: 2.14 microintl units/mL (ref 0.35–5.50)

## 2010-02-24 ENCOUNTER — Telehealth: Payer: Self-pay | Admitting: Internal Medicine

## 2010-03-04 ENCOUNTER — Ambulatory Visit: Payer: Self-pay | Admitting: Internal Medicine

## 2010-03-04 DIAGNOSIS — R930 Abnormal findings on diagnostic imaging of skull and head, not elsewhere classified: Secondary | ICD-10-CM | POA: Insufficient documentation

## 2010-03-04 DIAGNOSIS — R0609 Other forms of dyspnea: Secondary | ICD-10-CM | POA: Insufficient documentation

## 2010-03-04 DIAGNOSIS — R0989 Other specified symptoms and signs involving the circulatory and respiratory systems: Secondary | ICD-10-CM | POA: Insufficient documentation

## 2010-03-05 LAB — CONVERTED CEMR LAB
BUN: 24 mg/dL — ABNORMAL HIGH (ref 6–23)
Basophils Absolute: 0 10*3/uL (ref 0.0–0.1)
Basophils Relative: 0.6 % (ref 0.0–3.0)
CO2: 32 meq/L (ref 19–32)
Calcium: 9.5 mg/dL (ref 8.4–10.5)
Chloride: 107 meq/L (ref 96–112)
Creatinine, Ser: 1.2 mg/dL (ref 0.4–1.2)
Eosinophils Absolute: 0.2 10*3/uL (ref 0.0–0.7)
Eosinophils Relative: 3.7 % (ref 0.0–5.0)
GFR calc non Af Amer: 46.61 mL/min (ref >60)
Glucose, Bld: 88 mg/dL (ref 70–99)
HCT: 35.8 % — ABNORMAL LOW (ref 36.0–46.0)
Hemoglobin: 12.2 g/dL (ref 12.0–15.0)
Lymphocytes Relative: 21.2 % (ref 12.0–46.0)
Lymphs Abs: 1.1 10*3/uL (ref 0.7–4.0)
MCHC: 34 g/dL (ref 30.0–36.0)
MCV: 88.8 fL (ref 78.0–100.0)
Monocytes Absolute: 0.4 10*3/uL (ref 0.1–1.0)
Monocytes Relative: 7.8 % (ref 3.0–12.0)
Neutro Abs: 3.5 10*3/uL (ref 1.4–7.7)
Neutrophils Relative %: 66.7 % (ref 43.0–77.0)
Platelets: 178 10*3/uL (ref 150.0–400.0)
Potassium: 4.2 meq/L (ref 3.5–5.1)
Pro B Natriuretic peptide (BNP): 40.1 pg/mL (ref 0.0–100.0)
RBC: 4.03 M/uL (ref 3.87–5.11)
RDW: 14.7 % — ABNORMAL HIGH (ref 11.5–14.6)
Sed Rate: 16 mm/hr (ref 0–22)
Sodium: 144 meq/L (ref 135–145)
WBC: 5.3 10*3/uL (ref 4.5–10.5)

## 2010-03-19 ENCOUNTER — Telehealth: Payer: Self-pay | Admitting: Internal Medicine

## 2010-03-23 ENCOUNTER — Encounter: Payer: Self-pay | Admitting: Internal Medicine

## 2010-03-24 ENCOUNTER — Encounter: Payer: Self-pay | Admitting: Internal Medicine

## 2010-03-26 ENCOUNTER — Telehealth: Payer: Self-pay | Admitting: Internal Medicine

## 2010-03-26 ENCOUNTER — Encounter: Payer: Self-pay | Admitting: Internal Medicine

## 2010-04-06 ENCOUNTER — Encounter: Payer: Self-pay | Admitting: Internal Medicine

## 2010-04-12 ENCOUNTER — Telehealth: Payer: Self-pay | Admitting: Internal Medicine

## 2010-04-15 ENCOUNTER — Ambulatory Visit: Payer: Self-pay | Admitting: Internal Medicine

## 2010-04-15 ENCOUNTER — Ambulatory Visit: Payer: Self-pay | Admitting: Cardiology

## 2010-04-15 ENCOUNTER — Encounter: Payer: Self-pay | Admitting: Internal Medicine

## 2010-04-30 ENCOUNTER — Ambulatory Visit: Payer: Self-pay | Admitting: Women's Health

## 2010-04-30 ENCOUNTER — Other Ambulatory Visit: Admission: RE | Admit: 2010-04-30 | Discharge: 2010-04-30 | Payer: Self-pay | Admitting: Obstetrics and Gynecology

## 2010-05-18 ENCOUNTER — Telehealth: Payer: Self-pay | Admitting: Internal Medicine

## 2010-05-20 ENCOUNTER — Ambulatory Visit: Payer: Self-pay | Admitting: Internal Medicine

## 2010-05-24 LAB — CONVERTED CEMR LAB
Basophils Absolute: 0 10*3/uL (ref 0.0–0.1)
Basophils Relative: 0.4 % (ref 0.0–3.0)
Eosinophils Absolute: 0.1 10*3/uL (ref 0.0–0.7)
Eosinophils Relative: 1 % (ref 0.0–5.0)
HCT: 39.8 % (ref 36.0–46.0)
Hemoglobin: 13.4 g/dL (ref 12.0–15.0)
IgE (Immunoglobulin E), Serum: 5.9 intl units/mL (ref 0.0–180.0)
Lymphocytes Relative: 28.4 % (ref 12.0–46.0)
Lymphs Abs: 1.7 10*3/uL (ref 0.7–4.0)
MCHC: 33.6 g/dL (ref 30.0–36.0)
MCV: 88.2 fL (ref 78.0–100.0)
Monocytes Absolute: 0.4 10*3/uL (ref 0.1–1.0)
Monocytes Relative: 6.6 % (ref 3.0–12.0)
Neutro Abs: 3.7 10*3/uL (ref 1.4–7.7)
Neutrophils Relative %: 63.6 % (ref 43.0–77.0)
Platelets: 176 10*3/uL (ref 150.0–400.0)
RBC: 4.51 M/uL (ref 3.87–5.11)
RDW: 14.3 % (ref 11.5–14.6)
WBC: 5.8 10*3/uL (ref 4.5–10.5)

## 2010-05-27 ENCOUNTER — Ambulatory Visit: Payer: Self-pay | Admitting: Internal Medicine

## 2010-06-09 ENCOUNTER — Ambulatory Visit: Payer: Self-pay | Admitting: Internal Medicine

## 2010-06-09 ENCOUNTER — Ambulatory Visit: Payer: Self-pay | Admitting: Gastroenterology

## 2010-06-09 ENCOUNTER — Inpatient Hospital Stay (HOSPITAL_COMMUNITY)
Admission: EM | Admit: 2010-06-09 | Discharge: 2010-06-20 | Payer: Self-pay | Source: Home / Self Care | Admitting: Emergency Medicine

## 2010-06-14 ENCOUNTER — Encounter: Payer: Self-pay | Admitting: Gastroenterology

## 2010-07-05 ENCOUNTER — Ambulatory Visit: Payer: Self-pay | Admitting: Internal Medicine

## 2010-07-05 LAB — CONVERTED CEMR LAB
ALT: 24 units/L (ref 0–35)
AST: 24 units/L (ref 0–37)
Albumin: 4 g/dL (ref 3.5–5.2)
Alkaline Phosphatase: 53 units/L (ref 39–117)
BUN: 21 mg/dL (ref 6–23)
Basophils Absolute: 0 10*3/uL (ref 0.0–0.1)
Basophils Relative: 0.8 % (ref 0.0–3.0)
Bilirubin Urine: NEGATIVE
Bilirubin, Direct: 0.1 mg/dL (ref 0.0–0.3)
CO2: 30 meq/L (ref 19–32)
Calcium: 9.3 mg/dL (ref 8.4–10.5)
Chloride: 104 meq/L (ref 96–112)
Cholesterol: 161 mg/dL (ref 0–200)
Creatinine, Ser: 1.2 mg/dL (ref 0.4–1.2)
Eosinophils Absolute: 0.1 10*3/uL (ref 0.0–0.7)
Eosinophils Relative: 1.5 % (ref 0.0–5.0)
GFR calc non Af Amer: 46.13 mL/min (ref >60)
Glucose, Bld: 88 mg/dL (ref 70–99)
HCT: 36.6 % (ref 36.0–46.0)
HDL: 46.7 mg/dL (ref >39.00)
Hemoglobin, Urine: NEGATIVE
Hemoglobin: 12.1 g/dL (ref 12.0–15.0)
Ketones, ur: NEGATIVE mg/dL
LDL Cholesterol: 91 mg/dL (ref 0–99)
Leukocytes, UA: NEGATIVE
Lymphocytes Relative: 34.8 % (ref 12.0–46.0)
Lymphs Abs: 1.9 10*3/uL (ref 0.7–4.0)
MCHC: 33.2 g/dL (ref 30.0–36.0)
MCV: 88.1 fL (ref 78.0–100.0)
Monocytes Absolute: 0.4 10*3/uL (ref 0.1–1.0)
Monocytes Relative: 7.6 % (ref 3.0–12.0)
Neutro Abs: 3 10*3/uL (ref 1.4–7.7)
Neutrophils Relative %: 55.3 % (ref 43.0–77.0)
Nitrite: NEGATIVE
Platelets: 232 10*3/uL (ref 150.0–400.0)
Potassium: 4.5 meq/L (ref 3.5–5.1)
RBC: 4.16 M/uL (ref 3.87–5.11)
RDW: 16 % — ABNORMAL HIGH (ref 11.5–14.6)
Sodium: 141 meq/L (ref 135–145)
Specific Gravity, Urine: 1.015 (ref 1.000–1.030)
TSH: 1.77 microintl units/mL (ref 0.35–5.50)
Total Bilirubin: 0.7 mg/dL (ref 0.3–1.2)
Total CHOL/HDL Ratio: 3
Total Protein, Urine: NEGATIVE mg/dL
Total Protein: 6.8 g/dL (ref 6.0–8.3)
Triglycerides: 119 mg/dL (ref 0.0–149.0)
Urine Glucose: NEGATIVE mg/dL
Urobilinogen, UA: 0.2 (ref 0.0–1.0)
VLDL: 23.8 mg/dL (ref 0.0–40.0)
WBC: 5.4 10*3/uL (ref 4.5–10.5)
pH: 7 (ref 5.0–8.0)

## 2010-07-07 ENCOUNTER — Ambulatory Visit: Payer: Self-pay | Admitting: Internal Medicine

## 2010-08-22 ENCOUNTER — Encounter: Payer: Self-pay | Admitting: Orthopedic Surgery

## 2010-08-22 ENCOUNTER — Encounter: Payer: Self-pay | Admitting: Internal Medicine

## 2010-08-29 LAB — CONVERTED CEMR LAB
Bilirubin Urine: NEGATIVE
Glucose, Urine, Semiquant: NEGATIVE
Ketones, urine, test strip: NEGATIVE
Nitrite: NEGATIVE
Protein, U semiquant: NEGATIVE
Specific Gravity, Urine: 1.015
Urobilinogen, UA: 0.2
pH: 5

## 2010-08-31 ENCOUNTER — Telehealth: Payer: Self-pay | Admitting: Internal Medicine

## 2010-09-01 ENCOUNTER — Ambulatory Visit: Payer: Self-pay | Admitting: Internal Medicine

## 2010-09-01 ENCOUNTER — Ambulatory Visit: Admit: 2010-09-01 | Payer: Self-pay | Admitting: Internal Medicine

## 2010-09-02 NOTE — Progress Notes (Signed)
Summary: LABS  Phone Note Call from Patient   Summary of Call: At last office visit pt was advised to come back in may. Pt wants to know if she needs labs prior. Pt c/o fatigue & also knows that she will be having a CT chest scheduled by Dr Artist Pais soon.  Initial call taken by: Lamar Sprinkles,  November 29, 2007 11:35 AM  Follow-up for Phone Call        yes,  arrange following labs: CBCD, TSH:   780.79 BMET 401.9 AST, ALT, FLP:  272.4  CT of chest to be scheduled July, 2009 Follow-up by: D. Thomos Lemons DO,  November 29, 2007 11:42 AM  Additional Follow-up for Phone Call Additional follow up Details #1::        Pt's husband informed Additional Follow-up by: Lamar Sprinkles,  Nov 30, 2007 2:15 PM  New Problems: FATIGUE (ICD-780.79)   New Problems: FATIGUE (ICD-780.79)

## 2010-09-02 NOTE — Assessment & Plan Note (Signed)
Summary: Pulmonary/ ext f/u cough with abn cxr, await cxr   Copy to:  Dr. Thomos Lemons Primary Provider/Referring Provider:  D. Thomos Lemons DO  CC:  6 wk followup with PFT's.  Pt states that her cough is the same- no better or worse.  She states that "there is always something in throat". Her breathing is the same- no better or worse. Marland Kitchen  History of Present Illness: 50 yowf quit smoking in 1977 with no resp problems chronically limited by right hip problems not sob.  March 04, 2010 cc paroxysms of sob not necessarily related to ex but happening nightly x 1 year indolent onset initially, not progressive, usually around 2 am minimal assoc dry cough and no diaphoreisis or  cp. No fluctuation.      has taken macrobid in past for uti's.    rec No macrobid  Zantac 150 mg one with bfast and one at bedtime Increaese cozaar to 50 mg in am  April 15, 2010 6 wk followup with PFT's.  Pt states that her cough is the same- no better or worse.  She states that "there is always something in throat". Her breathing is the same- no better or worse. constant sensation of globus, occ noct awakening with cough, remains mostly dry.  mild doe only. Pt denies any significant sore throat, dysphagia, itching, sneezing,  nasal congestion or excess secretions,  fever, chills, sweats, unintended wt loss, pleuritic or exertional cp, hempoptysis, change in activity tolerance  orthopnea pnd or leg swelling. Pt also denies any obvious fluctuation in symptoms with weather or environmental change or other alleviating or aggravating factors.       Current Medications (verified): 1)  Crestor 20 Mg Tabs (Rosuvastatin Calcium) .... One By Mouth Once Daily 2)  Oscal 500/200 D-3 500-200 Mg-Unit  Tabs (Calcium-Vitamin D) .... By Mouth Once Daily 3)  Sertraline Hcl 100 Mg Tabs (Sertraline Hcl) .... One By Mouth Qd 4)  Multivitamins   Tabs (Multiple Vitamin) .Marland Kitchen.. 1 By Mouth Once Daily 5)  Zolpidem Tartrate 10 Mg Tabs (Zolpidem  Tartrate) .... One By Mouth At Bedtime Prn 6)  Otc Acid Reducer (? Strength) .... One With Bfast and One Bedtime 7)  Gabapentin 100 Mg Caps (Gabapentin) .... One To Two Tabs By Mouth At Bedtime As Directed 8)  Cozaar 50 Mg  Tabs (Losartan Potassium) .... One Tablet By Mouth Daily 9)  Fluticasone Propionate 50 Mcg/act Susp (Fluticasone Propionate) .... 2 Sprays Each Nostril Once Daily As Needed  Allergies (verified): 1)  ! Sulfa 2)  ! Celebrex 3)  ! Hydrocodone  Past History:  Past Medical History: IBS GERD Anemia  Depression     Hyperlipiidemia Osteopenia S/P fracture of right shoulder & hip  - Dr Thurston Hole, with post fx right shoulder adhesive capsultiis hx of mild left shoulder adhesive capsulitis 2009 - no surgury Hypertension Cystitis Fibromyalgia Chronic Insomnia Pulmonary nodule - 5 mm anterior RUL - 12/08       Repeat CT of Chest 06/09 - no change         Repeat CT RUL 3 mm Triad 02/19/10 Palpitations Exertional dyspnea     - No sign desats x 185 x3 March 04, 2010  Fatigue Polycystic kidney - followed by Dr. 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 Chronic cough ...........................................................................Marland KitchenWert    - PFT's nl x for truncation of fv loop April 15, 2010     -  Sinus ct rec April 15, 2010  > nl   Vital Signs:  Patient profile:   70 year old female Weight:      141 pounds O2 Sat:      95 % on Room air Temp:     98.5 degrees F oral Pulse rate:   101 / minute BP sitting:   114 / 72  (left arm)  Vitals Entered By: Vernie Murders (April 15, 2010 10:04 AM)  O2 Flow:  Room air  Physical Exam  Additional Exam:   wt 139 > 141 March 04, 2010 > 141 April 15, 2010  amb anxious hoarse wf nad HEENT mild turbinate edema.  Oropharynx no thrush or excess pnd or cobblestoning.  No JVD or cervical adenopathy. Mild accessory  muscle hypertrophy. Trachea midline, nl thryroid. Chest was hyperinflated by percussion with diminished breath sounds and moderate increased exp time without wheeze. Hoover sign positive at mid inspiration. Regular rate and rhythm without murmur gallop or rub or increase P2 or edema.  Abd: no hsm, nl excursion. Ext warm without cyanosis or clubbing.     CXR  Procedure date:  04/15/2010  Findings:      COPD.  No active disease.  Impression & Recommendations:  Problem # 1:  COUGH, CHRONIC (ICD-786.2)   Classic Upper airway cough syndrome, so named because it's frequently impossible to sort out how much is  CR/sinusitis with freq throat clearing (which can be related to primary GERD)   vs  causing  secondary extra esophageal GERD from wide swings in gastric pressure that occur with throat clearing, promoting self use of mint and menthol lozenges that reduce the lower esophageal sphincter tone and exacerbate the problem further . These are the same pts who not infrequently have failed to tolerate ace inhibitors,  dry powder inhalers or biphosphonates or report having reflux symptoms that don't respond to standard doses of PPI   Sinus Ct is neg so most likely mech is a cyclical cough.  Of the three most common causes of chronic cough, only one (GERD) can actually cause the other two(pnds, asthma)  and perpetuate the cylce of cough inducing airway trauma, inflammation, heightened sensitivity to reflux which is prompted by the cough itself via a cyclical mechanism.  This may partially respond to steroids and look like asthma and post nasal drainage but never erradicated completely unless the cough and the secondary reflux are eliminated, preferably both at the same time.   See instructions for specific recommendations      Problem # 2:  ABNORMAL LUNG XRAY (ICD-793.1) Reviewed actual cxr on imagecast  - copd only - strongly doubt the  3 mm nodule on CT will ever be relevant or viz by plain cxr but  would do yearly cxr's only in this setting given the cost radiation exposure involved in being more aggressive  Medications Added to Medication List This Visit: 1)  Otc Acid Reducer (? Strength)  .... One with bfast and one bedtime 2)  Fluticasone Propionate 50 Mcg/act Susp (Fluticasone propionate) .... 2 sprays each nostril once daily as needed 3)  Pepcid 20 Mg Tabs (Famotidine) .... Take one by mouth at bedtime 4)  Nexium 40 Mg Cpdr (Esomeprazole magnesium) .... By mouth daily. take one half hour before eating. 5)  Tramadol Hcl 50 Mg Tabs (Tramadol hcl) .... One to two by mouth every 4-6 hours 6)  Chlor-trimeton 4 Mg Tabs (Chlorpheniramine maleate) .... One every 6 hours if needed 7)  Prednisone  10 Mg Tabs (Prednisone) .... 4 each am x 2days, 2x2days, 1x2days and stop  Other Orders: Radiology Referral (Radiology) Est. Patient Level IV (04540) T-2 View CXR (71020TC)  Patient Instructions: 1)  See Patient Care Coordinator before leaving for sinus ct 2)  Nexium 40 Take  one 30-60 min before first meal of the day  along with pepcid  20 mg at bedtime 3)  Prednisone 10 4 each am x 2days, 2x2days, 1x2days and stop 4)  for sensation of throat tickle or drainage use chlortrimeton 4 mg one  at bedtime and as needed during the day 6 hours   5)  Take delsym two tsp every 12 hours and add tramadol 50 mg up to 1-2 every 4 hours to suppress the urge to cough. Swallowing water or using ice chips/non mint and menthol containing candies (such as lifesavers or sugarless jolly ranchers) are also effective 6)  GERD (REFLUX)  is a common cause of respiratory symptoms. It commonly presents without heartburn and can be treated with medication, but also with lifestyle changes including avoidance of late meals, excessive alcohol, smoking cessation, and avoid fatty foods, chocolate, peppermint, colas, red wine, and acidic juices such as orange juice. NO MINT OR MENTHOL PRODUCTS SO NO COUGH DROPS  7)  USE SUGARLESS  CANDY INSTEAD (jolley ranchers)  8)  NO OIL BASED VITAMINS   9)  Please schedule a follow-up appointment in 4 weeks, sooner if needed  Prescriptions: PREDNISONE 10 MG  TABS (PREDNISONE) 4 each am x 2days, 2x2days, 1x2days and stop  #14 x 0   Entered and Authorized by:   Nyoka Cowden MD   Signed by:   Nyoka Cowden MD on 04/15/2010   Method used:   Electronically to        [facility]* (retail)       [address]       [place], [place]  [address]       Ph: [phone]       Fax: [phone]   RxID:   [id number] TRAMADOL HCL 50 MG  TABS (TRAMADOL HCL) One to two by mouth every 4-6 hours  #40 x 0   Entered and Authorized by:   Nyoka Cowden MD   Signed by:   Nyoka Cowden MD on 04/15/2010   Method used:   Electronically to        [facility]* (retail)       [address]       [place], [place]  [address]       Ph: [phone]       Fax: [phone]   RxID:   [id number] NEXIUM 40 MG  CPDR (ESOMEPRAZOLE MAGNESIUM) By mouth daily. Take one half hour before eating.  #34 x 3   Entered and Authorized by:   Nyoka Cowden MD   Signed by:   Nyoka Cowden MD on 04/15/2010   Method used:   Electronically to        [facility]* (retail)       [address]       [place], [place]  [address]       Ph: [phone]       Fax: [phone]   RxID:   [id number]

## 2010-09-02 NOTE — Miscellaneous (Signed)
Summary: clotest  Clinical Lists Changes  Problems: Added new problem of GASTRITIS (ICD-535.50) Orders: Added new Test order of TLB-H Pylori Screen Gastric Biopsy (83013-CLOTEST) - Signed 

## 2010-09-02 NOTE — Assessment & Plan Note (Signed)
Summary: CONSULTION/HEA   Vital Signs:  Patient Profile:   70 Years Old Female Height:     66 inches Weight:      138.50 pounds BMI:     22.44 Temp:     98.0 degrees F oral Pulse rate:   76 / minute Pulse rhythm:   regular Resp:     16 per minute BP sitting:   110 / 70  (right arm) Cuff size:   regular  Vitals Entered By: Glendell Docker CMA (September 16, 2008 9:11 AM)             Is Patient Diabetic? No     PCP:  Dondra Spry DO  Chief Complaint:  Consultation.  History of Present Illness: 70 year old white female for follow-up.  Patient is seen by Dr. Andrey Campanile for abdominal pain.  Her workup included CT scan of abdomen and pelvis.  It showed no acute process with incidental renal cysts and benign-appearing liver lesion.  There was concern for polycystic kidney disease especially with her history of microscopic hematuria.  Patient was referred to a nephrologist Dr. Hyman Hopes.  Patient has history of intermittent headaches.  Dr. Hyman Hopes concerned about possibility of brain aneurysm in polycystic kidney patient.  MRI of the brain was negative however MRA of the brain not yet obtained.  Depression/anxiety-patient's husband has a history of prostate cancer.  He was recently diagnosed with advanced metastatic prostate cancer.  She reports increase stress and anxiety.  Abdominal pain she has a history of IBS.  Her last colonoscopy was approximately 5 years ago.  She reports bloating sensation.  She has loose stools 2  to 3 times per day.    Current Allergies (reviewed today): ! SULFA ! CELEBREX ! HYDROCODONE  Past Medical History:    IBS    GERD    Anemia    Depression    Hyperlipiidemia    Osteopenia    S/P fracture of right shoulder & hip    Hypertension    Cystitis    Fibromyalgia    Chronic Insomnia    Pulmonary nodule - 5 mm anterior RUL - 12/08          Repeat CT of Chest 06/09 - no change     Past Surgical History:    EGD    Total Abdominal Hysterectomy W/ BSO  Cholecystectomy     Social History:    Occupation:  works for Restaurant manager, [employer]    Married    Former Smoker quit 35 years ago (5 pack year history)    Alcohol use-no           Risk Factors:  Caffeine use:  2 drinks per day Exercise:  no  Mammogram History:     Date of Last Mammogram:  01/15/2008    Results:  normal     Physical Exam  General:     alert, well-developed, and well-nourished.   Head:     normocephalic and atraumatic.   Eyes:     vision grossly intact, pupils equal, pupils round, and pupils reactive to light.   Neck:     supple and no masses.   Lungs:     normal respiratory effort and normal breath sounds.   Heart:     normal rate, regular rhythm, and no gallop.   Abdomen:     soft.  mild diffuse tenderness Extremities:     No lower extremity edema  Neurologic:     cranial nerves II-XII intact  and gait normal.   Psych:     good eye contact, tearful, and slightly anxious.      Impression & Recommendations:  Problem # 1:  HEADACHE (ICD-784.0) 70 y/o white female found to have bilateral small renal cysts.   Pt seen by Dr. Hyman Hopes.   She has mild polycystic kidneys.   She has hx of intermittent headaches.  I agree an MRA of brain would be helpful to r/u aneurysm.   MRI of brain 09/11/08 - negative for acute process.   Her updated medication list for this problem includes:    Bystolic 5 Mg Tabs (Nebivolol hcl) ..... One by mouth once daily  Orders: Radiology Referral (Radiology)   Problem # 2:  ABDOMINAL PAIN (ICD-789.00) Improved.   She reports bloating sensation.  She has hx of IBS.   I suggest fiber supplement and probiotic.   She can also try peppermint oil as long as it does not exacerbate her GERD.   Problem # 3:  DEPRESSION (ICD-311) Pt's husband has hx of prostate ca.   He recently found out he has metastatic disease.  She reports increased anxiety.   Increase sertraline to 100 mg.  Her updated medication list for this problem includes:     Sertraline Hcl 100 Mg Tabs (Sertraline hcl) ..... One by mouth qd   Complete Medication List: 1)  Crestor 20 Mg Tabs (Rosuvastatin calcium) .... One by mouth once daily 2)  Oscal 500/200 D-3 500-200 Mg-unit Tabs (Calcium-vitamin d) .... By mouth once daily 3)  Vivelle-dot 0.025 Mg/24hr Pttw (Estradiol) .... One twice weekly 4)  Fosamax 70 Mg Tabs (Alendronate sodium) .... One tablet by mouth once a week 5)  Cozaar 25 Mg Tabs (Losartan potassium) .... One by mouth once daily 6)  Bystolic 5 Mg Tabs (Nebivolol hcl) .... One by mouth once daily 7)  Sertraline Hcl 100 Mg Tabs (Sertraline hcl) .... One by mouth qd 8)  Fluticasone Propionate 50 Mcg/act Susp (Fluticasone propionate) .... 2 sprays each nostril once daily 9)  Zolpidem Tartrate 10 Mg Tabs (Zolpidem tartrate) .... One by mouth at bedtime prn   Patient Instructions: 1)  Please schedule a follow-up appointment in 2 months. 2)  Our office will contact you re:  MRA of Brain. 3)  Try taking metamucil or citrucelle once daily 4)  Take probiotic supplement daily (Align, Culturelle, Accuflora)   Prescriptions: SERTRALINE HCL 100 MG TABS (SERTRALINE HCL) one by mouth qd  #30 x 3   Entered and Authorized by:   D. Thomos Lemons DO   Signed by:   D. Thomos Lemons DO on 09/16/2008   Method used:   Electronically to        [facility]* (retail)       [address]       [place], [place]  [address]       Ph: [phone]       Fax: [phone]   RxID:   [id number]   Current Allergies (reviewed today): ! SULFA ! CELEBREX ! HYDROCODONE   Preventive Care Screening  Mammogram:    Date:  01/15/2008    Results:  normal

## 2010-09-02 NOTE — Progress Notes (Signed)
Summary: Test Results  Phone Note Outgoing Call   Summary of Call: call pt - MRA negative for aneurysm Initial call taken by: D. Thomos Lemons DO,  September 22, 2008 6:53 PM  Follow-up for Phone Call        Patient's husband gave me a number to call later on in the day. [phone]. Follow-up by: Darra Lis RMA,  September 23, 2008 8:11 AM  Additional Follow-up for Phone Call Additional follow up Details #1::        patients husband adivsed per Dr Artist Pais instructions.  Additional Follow-up by: Glendell Docker CMA,  September 23, 2008 8:24 AM

## 2010-09-02 NOTE — Procedures (Signed)
Summary: Spirometry / [facility]  Spirometry / [facility]   Imported By: Lennie Odor 04/20/2010 15:13:37  _____________________________________________________________________  External Attachment:    Type:   Image     Comment:   External Document

## 2010-09-02 NOTE — Assessment & Plan Note (Signed)
Summary: LOWER STOMACH PAIN W/FREQUENCY-$50-STC   Vital Signs:  Patient Profile:   70 Years Old Female Height:     66 inches Weight:      135.25 pounds BMI:     21.91 Temp:     96.8 degrees F oral Pulse rate:   77 / minute BP sitting:   136 / 87  (right arm)  Vitals Entered By: Glendell Docker (January 16, 2008 8:36 AM)                 Chief Complaint:  Multiple medical problems or concerns.  History of Present Illness: patient has mutliple concerns she has had a full sharp ache in her lower abdomen with urinary urgency and frequency for the past week, denies pain with voiding, she also complains of unresolved productive cough yellow in color, with a wheeze that she has had for the past 2 months after trying to get over a cold, chronic fatigue and sob mostly at night and the Remus Loffler is not keeping her asleep at night    Current Allergies (reviewed today): ! SULFA ! CELEBREX ! HYDROCODONE  Past Medical History:    Reviewed history from 12/05/2007 and no changes required:       GERD       Anemia       Depression       Hyperlipiidemia       Osteopenia       S/P fracture of right shoulder & hip       Hypertension       Cystitis       Fibromyalgia       Chronic Insomnia       Pulmonary nodule - 5 mm anterior RUL - 12/08   Social History:    Reviewed history from 08/09/2007 and no changes required:       Occupation:  works for [employer]       Married       Former Smoker quit 35 years ago (5 pack year history)       Alcohol use-no   Risk Factors:  PAP Smear History:     Date of Last PAP Smear:  08/20/2007    Results:  normal    Review of Systems       She has chronic aches and pains.  She also has chronic fatigue.   Physical Exam  General:     alert, well-developed, and well-nourished.   Head:     normocephalic and atraumatic.   Eyes:     vision grossly intact, pupils equal, pupils round, and pupils reactive to light.   Ears:     R ear normal  and L ear normal.   Mouth:     pharynx pink and moist and postnasal drip.   Neck:     supple and no masses.   Lungs:     normal respiratory effort, normal breath sounds, and no wheezes.   Heart:     normal rate, regular rhythm, and no gallop.   Abdomen:     soft.  mild suprapubic tenderness.  no flank tenderness    Impression & Recommendations:  Problem # 1:  URINARY FREQUENCY (ICD-788.41) If u/a +, cipro 250 mg by mouth two times a day x 5 days.  Orders: TLB-Udip w/ Micro (81001-URINE)   Problem # 2:  FIBROMYALGIA (ICD-729.1) Trial of Savella.  4 wk titration pack provided.  Pt advised to taper Zoloft within next 2 -4 wks.  Orders: TLB-CK Total Only(Creatine Kinase/CPK) (82550-CK) TLB-Sedimentation Rate (ESR) (85651-ESR)   Problem # 3:  INSOMNIA-SLEEP DISORDER-UNSPEC (ICD-307.40) Pt getting up in the middle of the night despite Ambien Cr.    Trial of Lunesta 2 mg by mouth qhs  Problem # 4:  PULMONARY NODULE, RIGHT UPPER LOBE (ICD-518.89) Repeat CT of chest to ensure stablility.  Pt has been worried about pulm nodule. Orders: Radiology Referral (Radiology) Orders: Radiology Referral (Radiology)   Problem # 5:  COUGH (ICD-786.2) I suspect symptoms related to GERD.  Omeprazole 20 mg by mouth two times a day.  Pt to raise head of bed.  Complete Medication List: 1)  Cozaar 25 Mg Tabs (Losartan potassium) .... Take 1 tablet by mouth every morning 2)  Crestor 20 Mg Tabs (Rosuvastatin calcium) .... One by mouth once daily 3)  Norvasc 2.5 Mg Tabs (Amlodipine besylate) .... Take 1 tablet by mouth once a day 4)  Oscal 500/200 D-3 500-200 Mg-unit Tabs (Calcium-vitamin d) .... By mouth once daily 5)  Vivelle-dot 0.025 Mg/24hr Pttw (Estradiol) .... One twice weekly 6)  Zoloft 50 Mg Tabs (Sertraline hcl) .... Take 1 tablet by mouth once a day 7)  Lunesta 2 Mg Tabs (Eszopiclone) .... One by mouth qhs 8)  Fosamax 70 Mg Tabs (Alendronate sodium) .... One tablet by mouth once a  week 9)  Omeprazole 20 Mg Tbec (Omeprazole) .... One by mouth two times a day ac 10)  Savella Titration Pack 12.5 & 25 & 50 Mg Misc (Milnacipran hcl) .... Take as directed 11)  Cipro 250 Mg Tabs (Ciprofloxacin hcl) .... One by mouth two times a day   Patient Instructions: 1)  Please schedule a follow-up appointment in 3 1/2  weeks.   Prescriptions: OMEPRAZOLE 20 MG  TBEC (OMEPRAZOLE) one by mouth two times a day ac  #60 x 5   Entered and Authorized by:   D. Thomos Lemons DO   Signed by:   D. Thomos Lemons DO on 01/16/2008   Method used:   Electronically sent to ...       [facility]*       [address]       [place], [place]  [address]       Ph: [phone]       Fax: [phone]   RxID:   [id number] LUNESTA 2 MG  TABS (ESZOPICLONE) one by mouth qhs  #30 x 5   Entered and Authorized by:   D. Thomos Lemons DO   Signed by:   D. Thomos Lemons DO on 01/16/2008   Method used:   Faxed to ...       [facility]*       [address]       [place], [place]  [address]       Ph: [phone]       Fax: [phone]   RxID:   [id number]  ]  Preventive Care Screening  Pap Smear:    Date:  08/20/2007    Results:  normal    Current Allergies (reviewed today): ! SULFA ! CELEBREX ! HYDROCODONE

## 2010-09-02 NOTE — Progress Notes (Signed)
Summary: Prolia  Phone Note Call from Patient   Caller: Patient Summary of Call: pt called left vm yesterday asking about Prolia.  I tried to call her today to get further details.  No answer........Marland Kitchenleft mess for pt to call back  Initial call taken by: Lanier Prude, [facility]),  May 18, 2010 12:20 PM  Follow-up for Phone Call        pt will sched inj appt after 05-25-10.  Will have Prolia ordered Follow-up by: Lanier Prude, [facility]),  May 18, 2010 4:42 PM  Additional Follow-up for Phone Call Additional follow up Details #1::        noted Additional Follow-up by: Corwin Levins MD,  May 18, 2010 5:14 PM

## 2010-09-02 NOTE — Progress Notes (Signed)
Summary: Omeprazole  Phone Note Call from Patient Call back at Work Phone [phone]   Caller: Patient Reason for Call: Refill Medication Summary of Call: patient called and left voice message requesting a refill on omeprazole. Call was returned to patient, patient was not available to speak with. Message was left to return call  Initial call taken by: Glendell Docker CMA,  March 05, 2009 4:59 PM  Follow-up for Phone Call        Per Dr Artist Pais medication for Omeprazole ok to refill patient will need to return for office visit for additional refills Follow-up by: Glendell Docker CMA,  March 05, 2009 5:01 PM    New/Updated Medications: OMEPRAZOLE 20 MG CPDR (OMEPRAZOLE) Take 1 capsule by mouth two times a day Prescriptions: OMEPRAZOLE 20 MG CPDR (OMEPRAZOLE) Take 1 capsule by mouth two times a day  #60 x 1   Entered by:   Glendell Docker CMA   Authorized by:   D. Thomos Lemons DO   Signed by:   Glendell Docker CMA on 03/05/2009   Method used:   Electronically to        [facility]* (retail)       [address]       [place], [place]  [address]       Ph: [phone]       Fax: [phone]   RxID:   [id number]

## 2010-09-02 NOTE — Assessment & Plan Note (Signed)
Summary: F/U   BP /hea   Vital Signs:  Patient profile:   70 year old female Height:      66 inches Weight:      140.75 pounds BMI:     22.80 O2 Sat:      98 % on Room air Temp:     98.0 degrees F oral Pulse rate:   80 / minute Pulse rhythm:   regular Resp:     16 per minute BP sitting:   112 / 80  (right arm) Cuff size:   regular  Vitals Entered By: Glendell Docker CMA (November 16, 2009 1:07 PM)  O2 Flow:  Room air CC: Rm 2- Follow up disease  management   Primary Care Provider:  Dondra Spry DO  CC:  Rm 2- Follow up disease  management.  History of Present Illness:  Hypertension Follow-Up      This is a 70 year old woman who presents for Hypertension follow-up.  The patient denies lightheadedness.  The patient denies the following associated symptoms: chest pain.  Compliance with medications (by patient report) has been near 100%.  The patient reports that dietary compliance has been fair.    husband - passed away from end stage prostate ca  fibromyalgia - acting up,  assoc with emotional stress family has been very supportive she is going back to work no morbid thoughts  hx of pulm nodule - no pulm symptoms  chronic insomnia - good sleep quality with ambien and gabapentin  Preventive Screening-Counseling & Management  Alcohol-Tobacco     Smoking Status: quit  Allergies: 1)  ! Sulfa 2)  ! Celebrex 3)  ! Hydrocodone  Past History:  Past Medical History: IBS GERD Anemia  Depression     Hyperlipiidemia Osteopenia S/P fracture of right shoulder & hip Hypertension Cystitis Fibromyalgia Chronic Insomnia Pulmonary nodule - 5 mm anterior RUL - 12/08       Repeat CT of Chest 06/09 - no change    Palpitations Exertional dyspnea Fatigue Polycystic kidney - followed by Dr. Hyman Hopes  Family History: Mother had MI at age  5 Father - COPD Siblings with history of nephritis Family History of Colon Cancer: grandmother      Review of Systems       more  neck and shoulder aches and pains occ notices a sweet odor,  no sinus congestion or other sinus symptoms  Physical Exam  General:  alert, well-developed, and well-nourished.   Neck:  supple and no masses.   Lungs:  normal respiratory effort and normal breath sounds.   Heart:  normal rate, regular rhythm, and no gallop.   Neurologic:  cranial nerves II-XII intact and gait normal.   Psych:  normally interactive, good eye contact, not anxious appearing, and not depressed appearing.     Impression & Recommendations:  Problem # 1:  ESSENTIAL HYPERTENSION (ICD-401.9) stable.  occ elevated readings assoc with emotional stress.  pt advised to monitor bp at home  Her updated medication list for this problem includes:    Cozaar 25 Mg Tabs (Losartan potassium) ..... One by mouth once daily  Orders: T-Basic Metabolic Panel [id number])  BP today: 112/80 Prior BP: 126/80 (08/10/2009)  Labs Reviewed: K+: 4.3 (03/10/2009) Creat: : 1.3 (03/10/2009)   Chol: 145 (03/10/2009)   HDL: 47.60 (03/10/2009)   LDL: 76 (03/10/2009)   TG: 107.0 (03/10/2009)  Problem # 2:  THROMBOCYTOPENIA (ICD-287.5) Pt with mild thrombocytopenia.  likely medication effect.  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 02/09/2010  Problem # 4:  FIBROMYALGIA (ICD-729.1) Assessment: Deteriorated worse with stress from passing of her husband.  increase dose of gabapentin at bedtime  Problem # 5:  HYPERLIPIDEMIA (ICD-272.2) she has not taken crestor x 2 months.  pt advised to restart at 1/2 dose.  Her updated medication list for this problem includes:    Crestor 20 Mg Tabs (Rosuvastatin calcium) ..... One by mouth once daily  Labs Reviewed: SGOT: 19 (03/10/2009)   SGPT: 21 (03/10/2009)   HDL:47.60 (03/10/2009), 44.7 (12/04/2007)  LDL:76 (03/10/2009), 72  (62/13/0865)  Chol:145 (03/10/2009), 135 (12/04/2007)  Trig:107.0 (03/10/2009), 93 (12/04/2007)  Complete Medication List: 1)  Crestor 20 Mg Tabs (Rosuvastatin calcium) .... One by mouth once daily 2)  Oscal 500/200 D-3 500-200 Mg-unit Tabs (Calcium-vitamin d) .... By mouth once daily 3)  Cozaar 25 Mg Tabs (Losartan potassium) .... One by mouth once daily 4)  Sertraline Hcl 100 Mg Tabs (Sertraline hcl) .... One by mouth qd 5)  Multivitamins Tabs (Multiple vitamin) .Marland Kitchen.. 1 by mouth once daily 6)  Zolpidem Tartrate 10 Mg Tabs (Zolpidem tartrate) .... One by mouth at bedtime prn 7)  Zantac 150 Mg Tabs (Ranitidine hcl) .... Take 1 tablet by mouth once a day 8)  Gabapentin 100 Mg Caps (Gabapentin) .... One to two tabs by mouth at bedtime as directed 9)  Voltaren 1 % Gel (Diclofenac sodium) .... Apply three times a day 10)  Cefuroxime Axetil 500 Mg Tabs (Cefuroxime axetil) .... One by mouth two times a day  Patient Instructions: 1)  Please schedule a follow-up appointment in 6 months. Prescriptions: ZOLPIDEM TARTRATE 10 MG TABS (ZOLPIDEM TARTRATE) one by mouth at bedtime prn  #90 x 1   Entered and Authorized by:   D. Thomos Lemons DO   Signed by:   D. Thomos Lemons DO on 11/16/2009   Method used:   Print then Give to Patient   RxID:   [id number] GABAPENTIN 100 MG CAPS (GABAPENTIN) one to two tabs by mouth at bedtime as directed  #60 x 5   Entered and Authorized by:   D. Thomos Lemons DO   Signed by:   D. Thomos Lemons DO on 11/16/2009   Method used:   Electronically to        [facility]* (retail)       [address]       [place], [place]  [address]       Ph: [phone]       Fax: [phone]   RxID:   [id number] SERTRALINE HCL 100 MG TABS (SERTRALINE HCL) one by mouth qd  #30 Each x 5   Entered and Authorized by:   D. Thomos Lemons DO   Signed by:   D. Thomos Lemons DO on 11/16/2009   Method used:   Electronically to        [facility]* (retail)       [address]       [place], [place]  [address]       Ph: [phone]       Fax: [phone]   RxID:   [id number] COZAAR 25 MG TABS (LOSARTAN POTASSIUM) one by mouth once daily  #30 x 5   Entered and Authorized by:   D. Thomos Lemons DO   Signed by:   D.  Thomos Lemons DO on 11/16/2009   Method used:   Electronically to        [facility]* (retail)       [address]       [place], [place]  [address]       Ph: [phone]       Fax: [phone]   RxID:   [id number] CRESTOR 20 MG TABS (ROSUVASTATIN CALCIUM) one by mouth once daily  #30 x 5   Entered and Authorized by:   D. Thomos Lemons DO   Signed by:   D. Thomos Lemons DO on 11/16/2009   Method used:   Electronically to        [facility]* (retail)       [address]       [place], [place]  [id number]       Ph: [phone]       Fax: [phone]   RxID:   [id number]

## 2010-09-02 NOTE — Miscellaneous (Signed)
Summary: Zolpidem  Clinical Lists Changes  Medications: Changed medication from AMBIEN CR 12.5 MG  TBCR (ZOLPIDEM TARTRATE) one by mouth at bedtime prn to AMBIEN CR 12.5 MG  TBCR (ZOLPIDEM TARTRATE) one by mouth at bedtime prn - Signed Rx of AMBIEN CR 12.5 MG  TBCR (ZOLPIDEM TARTRATE) one by mouth at bedtime prn;  #30 x 5;  Signed;  Entered by: Glendell Docker;  Authorized by: D. Thomos Lemons DO;  Method used: Telephoned to [facility]*, [address], [place], [place], [place]  [address], Ph: [phone] or [phone], Fax: [phone]    Prescriptions: AMBIEN CR 12.5 MG  TBCR (ZOLPIDEM TARTRATE) one by mouth at bedtime prn  #30 x 5   Entered by:   Glendell Docker   Authorized by:   D. Thomos Lemons DO   Signed by:   Glendell Docker on 11/26/2007   Method used:   Telephoned to ...       [facility]*       [address]       [place], [place]  [address]       Ph: [phone] or [phone]       Fax: [phone]   RxID:   [id number]

## 2010-09-02 NOTE — Consult Note (Signed)
Summary: [facility]   Imported By: Sherian Rein 06/17/2010 08:46:03  _____________________________________________________________________  External Attachment:    Type:   Image     Comment:   External Document

## 2010-09-02 NOTE — Assessment & Plan Note (Signed)
Summary: Left Ankle Pain/MHF   Vital Signs:  Patient profile:   70 year old female Weight:      143.25 pounds BMI:     23.20 Temp:     98.0 degrees F oral Pulse rate:   80 / minute Pulse rhythm:   regular Resp:     16 per minute BP sitting:   100 / 70  (left arm) Cuff size:   regular  Vitals Entered By: Glendell Docker CMA (April 02, 2009 11:22 AM)  Primary Care Provider:  Dondra Spry DO  CC:  left foot pain.  History of Present Illness: 70 y/o c/o left foot pain.  She has noticed mild swelling.   Lateral aspect and top of ankle tender.   Pain with turning foot inward.  She remembers spraining her ankle 6 months ago.   It felt better, then flared up 2-3 months ago.  She felt something pop while walking on shag carpet.  Chronic insomnia - much better with gabapentin.  She take 100mg  of gabapentin and 5 mg of ambien and sleeps through the night.     Allergies: 1)  ! Sulfa 2)  ! Celebrex 3)  ! Hydrocodone  Past History:  Past Medical History: IBS GERD Anemia Depression   Hyperlipiidemia Osteopenia S/P fracture of right shoulder & hip Hypertension Cystitis Fibromyalgia Chronic Insomnia Pulmonary nodule - 5 mm anterior RUL - 12/08       Repeat CT of Chest 06/09 - no change    Palpitations Exertional dyspnea Fatigue  Family History: Mother had MI at age  92 Father - COPD Siblings with history of nephritis Family History of Colon Cancer: grandmother   Social History: Occupation:  works for Restaurant manager, fast food union Married Former Smoker quit 35 years ago (5 pack year history) Alcohol use-no    Illicit Drug Use - no   Physical Exam  General:  alert, well-developed, and well-nourished.   Lungs:  Clear throughout to auscultation. Heart:  Regular rate and rhythm; no murmurs, rubs,  or bruits.   Foot/Ankle Exam  Ankle Exam:    Left:    Inspection:  Normal    Palpation:  Normal    Stability:  stable    Tenderness:  yes    Swelling:  yes  Erythema:  no    slight laxity of lateral collaterol ligaments.  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   Refer to Dr. Victorino Dike for further eval and treatment.  Use voltaren gel three times a day for now.  Avoid strenuous activity.  Wear supportive shoes.   Orders: Orthopedic Referral (Ortho) T-Ankle Comp Left Min 3 Views (73610TC)  Problem # 2:  INSOMNIA, CHRONIC (ICD-307.42) Assessment: Improved Good reponse to gabapentin which is indicated for fibromyalgia.  Pt will try tapering off ambien.  Complete Medication List: 1)  Crestor 20 Mg Tabs (Rosuvastatin calcium) .... One by mouth once daily 2)  Oscal 500/200 D-3 500-200 Mg-unit Tabs (Calcium-vitamin d) .... By mouth once daily 3)  Cozaar 25 Mg Tabs (Losartan potassium) .... One by mouth once daily 4)  Bystolic 5 Mg Tabs (Nebivolol hcl) .... One by mouth once daily 5)  Sertraline Hcl 100 Mg Tabs (Sertraline hcl) .... One by mouth qd 6)  Multivitamins Tabs (Multiple vitamin) .Marland Kitchen.. 1 by mouth once daily 7)  Zolpidem Tartrate 10 Mg Tabs (Zolpidem tartrate) .... One by mouth at bedtime prn 8)  Omeprazole 20 Mg Cpdr (Omeprazole) .... Take 1 capsule by mouth two times a day 9)  Gabapentin 100 Mg Caps (Gabapentin) .... One by mouth qhs 10)  Voltaren 1 % Gel (Diclofenac sodium) .... Apply three times a day  Patient Instructions: 1)  Please schedule a follow-up appointment in 4 months. Prescriptions: GABAPENTIN 100 MG CAPS (GABAPENTIN) one by mouth qhs  #30 x 5   Entered and Authorized by:   D. Thomos Lemons DO   Signed by:   D. Thomos Lemons DO on 04/02/2009   Method used:   Electronically to        [facility]* (retail)       [address]       [place], [place]  [address]       Ph: [phone]       Fax: [phone]   RxID:   [id number]   Current Allergies (reviewed today): ! SULFA ! CELEBREX ! HYDROCODONE

## 2010-09-02 NOTE — Miscellaneous (Signed)
Summary: Mammogram  Clinical Lists Changes  Observations: Added new observation of MAMMOGRAM: normal (03/20/2009 17:06)      Preventive Care Screening  Mammogram:    Date:  03/20/2009    Results:  normal

## 2010-09-02 NOTE — Assessment & Plan Note (Signed)
Summary: congestion in head and chest x 2 weeks/mhf   Vital Signs:  Patient profile:   70 year old female Height:      66 inches Weight:      145 pounds BMI:     23.49 O2 Sat:      98 % on Room air Temp:     97.7 degrees F oral Pulse rate:   68 / minute Pulse rhythm:   regular Resp:     18 per minute BP sitting:   126 / 80  (right arm) Cuff size:   regular  Vitals Entered By: Glendell Docker CMA (August 10, 2009 11:32 AM)  O2 Flow:  Room air  Primary Care Provider:  D. Thomos Lemons DO  CC:  URI symptoms.  History of Present Illness:  URI Symptoms      This is a 70 year old woman who presents with URI symptoms for the past 10 days.  The patient reports nasal congestion, clear nasal discharge, sore throat, dry cough, productive cough, earache, and sick contacts.  The patient denies fever, dyspnea, wheezing, and vomiting.  The patient also reports itchy watery eyes, itchy throat, sneezing, and headache.  The patient denies muscle aches and severe fatigue.   Alka Seltzer Plus over the counter with some relief.  Preventive Screening-Counseling & Management  Alcohol-Tobacco     Smoking Status: quit  Allergies: 1)  ! Sulfa 2)  ! Celebrex 3)  ! Hydrocodone  Past History:  Past Medical History: IBS GERD Anemia  Depression    Hyperlipiidemia Osteopenia S/P fracture of right shoulder & hip Hypertension Cystitis Fibromyalgia Chronic Insomnia Pulmonary nodule - 5 mm anterior RUL - 12/08       Repeat CT of Chest 06/09 - no change    Palpitations Exertional dyspnea Fatigue Polycystic kidney - followed by Dr. Hyman Hopes  Past Surgical History: Total Abdominal Hysterectomy W/ BSO Cholecystectomy       Family History: Mother had MI at age  36 Father - COPD Siblings with history of nephritis Family History of Colon Cancer: grandmother      Social History: Occupation:  works for Restaurant manager, fast food union Married Former Smoker quit 35 years ago (5 pack year history) Alcohol  use-no     Illicit Drug Use - no     Physical Exam  General:  alert, well-developed, and well-nourished.   Ears:  R ear normal and L ear normal.   Mouth:  pharyngeal erythema and postnasal drip.   Neck:  supple and no masses.   Lungs:  normal respiratory effort, normal breath sounds, and no wheezes.   Heart:  normal rate, regular rhythm, and no gallop.   Extremities:  No lower extremity edema    Impression & Recommendations:  Problem # 1:  RHINOSINUSITIS, ACUTE (ICD-461.8) 10 days of cough and right maxillary sinus pain/pressure.   use ceftin as directed along with nasal sinus irrigation.  Patient advised to call office if symptoms persist or worsen.  Her updated medication list for this problem includes:    Cefuroxime Axetil 500 Mg Tabs (Cefuroxime axetil) ..... One by mouth two times a day  Complete Medication List: 1)  Crestor 20 Mg Tabs (Rosuvastatin calcium) .... One by mouth once daily 2)  Oscal 500/200 D-3 500-200 Mg-unit Tabs (Calcium-vitamin d) .... By mouth once daily 3)  Cozaar 25 Mg Tabs (Losartan potassium) .... One by mouth once daily 4)  Sertraline Hcl 100 Mg Tabs (Sertraline hcl) .... One by mouth qd 5)  Multivitamins Tabs (Multiple vitamin) .Marland Kitchen.. 1 by mouth once daily 6)  Zolpidem Tartrate 10 Mg Tabs (Zolpidem tartrate) .... One by mouth at bedtime prn 7)  Zantac 150 Mg Tabs (Ranitidine hcl) .... Take 1 tablet by mouth once a day 8)  Gabapentin 100 Mg Caps (Gabapentin) .... One by mouth qhs 9)  Voltaren 1 % Gel (Diclofenac sodium) .... Apply three times a day 10)  Cefuroxime Axetil 500 Mg Tabs (Cefuroxime axetil) .... One by mouth two times a day  Patient Instructions: 1)  Use Lloyd Huger Med sinus rinse over the counter 2)  Call our office if your symptoms do not  improve or gets worse. Prescriptions: CEFUROXIME AXETIL 500 MG TABS (CEFUROXIME AXETIL) one by mouth two times a day  #20 x 0   Entered and Authorized by:   D. Thomos Lemons DO   Signed by:   D. Thomos Lemons  DO on 08/10/2009   Method used:   Electronically to        [facility]* (retail)       [address]       [place], [place]  [address]       Ph: [phone]       Fax: [phone]   RxID:   [id number]   Current Allergies (reviewed today): ! SULFA ! CELEBREX ! HYDROCODONE

## 2010-09-02 NOTE — Letter (Signed)
   [facility] [address] [place], [place]  [address]  [place] Phone: [phone]      November 17, 2009   Sandra Craig [address] [place], [place] [address]  RE:  LAB RESULTS  Dear  Ms. WHITFORD,  The following is an interpretation of your most recent lab tests.  Please take note of any instructions provided or changes to medications that have resulted from your lab work.  ELECTROLYTES:  Good - no changes needed  KIDNEY FUNCTION TESTS:  Good - no changes needed    CBC:  Good - no changes needed       Sincerely Yours,    Dr. Thomos Lemons

## 2010-09-02 NOTE — Progress Notes (Signed)
Summary: call request--lab result  Phone Note Call from Patient   Caller: Patient Summary of Call: Pt LMOM requesting a CB. I called back and LMOM for Pt to CB. Initial call taken by: Payton Spark CMA,  November 26, 2009 3:44 PM  Follow-up for Phone Call        Left message on machine to call back to office. Follow-up by: Lucious Groves,  November 27, 2009 1:58 PM  Additional Follow-up for Phone Call Additional follow up Details #1::        Spoke to pt. Pt requested to know if last platelet count was still low.  Advised pt. per 11/16/09 labs, platelet count was in normal range.  Pt requested copy of lab be mailed to her. Copy mailed.  Mervin Kung CMA  Dec 01, 2009 4:02 PM

## 2010-09-02 NOTE — Letter (Signed)
Summary: Patient Notice-Endo Biopsy Results  [facility]  [address]   [place], [place] [address]   Phone: [phone]  Fax:         Dec 09, 2008 MRN: [id number]    Sandra Craig [address] [place], [place]  [address]    Dear Ms. Laural Benes,  I am pleased to inform you that the biopsies taken during your recent endoscopic examination did not show any evidence of cancer upon pathologic examination.  Additional information/recommendations:  __No further action is needed at this time.  Please follow-up with      your primary care physician for your other healthcare needs.  __ Please call [phone] to schedule a return visit to review      your condition.  xx__ Continue with the treatment plan as outlined on the day of your      exam.  __ You should have a repeat endoscopic examination for this problem              in _ months/years.   Please call us if you are having persistent problems or have questions about your condition that have not been fully answered at this time.  Sincerely,  Mardella Layman MD [facility]  This letter has been electronically signed by your physician.  Appended Document: Patient Notice-Endo Biopsy Results Header on letter was changed to [facility] header and mailed to patient.

## 2010-09-02 NOTE — Consult Note (Signed)
Summary: [facility]  [facility]   Imported By: Lanelle Bal 05/05/2009 11:34:11  _____________________________________________________________________  External Attachment:    Type:   Image     Comment:   External Document

## 2010-09-02 NOTE — Assessment & Plan Note (Signed)
Summary: BLOOD PRESSURE TOO LOW/MHF   Vital Signs:  Patient profile:   70 year old female Weight:      142.50 pounds BMI:     23.08 Temp:     97.4 degrees F oral Pulse rate:   68 / minute Pulse rhythm:   regular Resp:     16 per minute BP sitting:   100 / 60  (right arm) Cuff size:   regular  Vitals Entered By: Glendell Docker CMA (April 22, 2009 11:22 AM)  Primary Care Provider:  Dondra Spry DO  CC:  Low Blood Pressure.  History of Present Illness: She was out Sunday and  was stooped down looking at something on a shelf and when she stood up, she almost fainted, but did not pass out. She rested for the remainder of the day. On Monday she started checking her blood pressure and did not take her blood pressure medication. Her pressure Monday night was 104/66, yesterday morning 93/66- she felt woozy and out of it at  work. This Morning 118/70, she did take her medication today after holding it for 2 days.   Allergies: 1)  ! Sulfa 2)  ! Celebrex 3)  ! Hydrocodone  Past History:  Past Medical History: IBS GERD Anemia Depression    Hyperlipiidemia Osteopenia S/P fracture of right shoulder & hip Hypertension Cystitis Fibromyalgia Chronic Insomnia Pulmonary nodule - 5 mm anterior RUL - 12/08       Repeat CT of Chest 06/09 - no change    Palpitations Exertional dyspnea Fatigue Polycystic kidney - followed by Dr. Hyman Hopes  Past Surgical History: Total Abdominal Hysterectomy W/ BSO Cholecystectomy      Family History: Mother had MI at age  77 Father - COPD Siblings with history of nephritis Family History of Colon Cancer: grandmother    Social History: Occupation:  works for Restaurant manager, fast food union Married Former Smoker quit 35 years ago (5 pack year history) Alcohol use-no     Illicit Drug Use - no   Physical Exam  General:  alert, well-developed, and well-nourished.   Neck:  Supple; no masses or thyromegaly. Lungs:  normal respiratory effort and normal  breath sounds.   Heart:  normal rate, regular rhythm, and no gallop.   Extremities:  No lower extremity edema    Impression & Recommendations:  Problem # 1:  ESSENTIAL HYPERTENSION (ICD-401.9) Pt having symptomatic low BP readings.  Taper off Bystolic.  Monitor BP at home.  The following medications were removed from the medication list:    Bystolic 5 Mg Tabs (Nebivolol hcl) ..... One by mouth once daily Her updated medication list for this problem includes:    Cozaar 25 Mg Tabs (Losartan potassium) ..... One by mouth once daily  BP today: 100/60 Prior BP: 100/70 (04/02/2009)  Labs Reviewed: K+: 4.3 (03/10/2009) Creat: : 1.3 (03/10/2009)   Chol: 145 (03/10/2009)   HDL: 47.60 (03/10/2009)   LDL: 76 (03/10/2009)   TG: 107.0 (03/10/2009)  Problem # 2:  HOT FLASHES (ICD-627.2) She is having occ hot flashes.  No debilitating.  I advised pt stay of HRT if possible.  Complete Medication List: 1)  Crestor 20 Mg Tabs (Rosuvastatin calcium) .... One by mouth once daily 2)  Oscal 500/200 D-3 500-200 Mg-unit Tabs (Calcium-vitamin d) .... By mouth once daily 3)  Cozaar 25 Mg Tabs (Losartan potassium) .... One by mouth once daily 4)  Sertraline Hcl 100 Mg Tabs (Sertraline hcl) .... One by mouth qd 5)  Multivitamins  Tabs (Multiple vitamin) .Marland Kitchen.. 1 by mouth once daily 6)  Zolpidem Tartrate 10 Mg Tabs (Zolpidem tartrate) .... One by mouth at bedtime prn 7)  Zantac 150 Mg Tabs (Ranitidine hcl) .... Take 1 tablet by mouth once a day 8)  Gabapentin 100 Mg Caps (Gabapentin) .... One by mouth qhs 9)  Voltaren 1 % Gel (Diclofenac sodium) .... Apply three times a day  Other Orders: Influenza Vaccine NON MCR ([address]) Admin of Therapeutic Inj (IM or [place]) ([address])  Patient Instructions: 1)  Taper off bystolic - take 1/2 x 1 week then stop. 2)  Monitor BP at home - once daily and keep log. 3)  Please schedule a follow-up appointment in 2 months.  Current Allergies (reviewed today): ! SULFA ! CELEBREX  ! HYDROCODONE   Immunizations Administered:  Influenza Vaccine # 1:    Vaccine Type: Fluvax Non-MCR    Site: left deltoid    Mfr: [employer]oSmith[employer]    Dose: 0.5 ml    Route: IM    Given by: Glendell Docker CMA    Exp. Date: 01/28/2010    Lot #: [id number]    VIS given: 03/10/2009  Flu Vaccine Consent Questions:    Do you have a history of severe allergic reactions to this vaccine? no    Any prior history of allergic reactions to egg and/or gelatin? no    Do you have a sensitivity to the preservative Thimersol? no    Do you have a past history of Guillan-Barre Syndrome? no    Do you currently have an acute febrile illness? no    Have you ever had a severe reaction to latex? no    Vaccine information given and explained to patient? yes    Are you currently pregnant? no

## 2010-09-02 NOTE — Assessment & Plan Note (Signed)
Summary: BP CHECKS, IRREGULAR HEART BEAT-CH   Vital Signs:  Patient Profile:   70 Years Old Female Height:     66 inches Weight:      135.25 pounds BMI:     21.91 Temp:     98.4 degrees F oral Pulse rate:   100 / minute Pulse rhythm:   regular Resp:     18 per minute BP sitting:   144 / 100  (right arm) Cuff size:   regular  Vitals Entered By: Glendell Docker CMA (February 25, 2008 12:19 PM)                 Chief Complaint:  Blood Pressure & Irregular Heart beat evaluation.  History of Present Illness: 70 year old white female for follow-up.  She was recently seen by her orthopedic physicians regarding shoulder and hip pain.  Orthopedic physician and also physician assistant noted irregular heart beat. Patient was advised to follow-up with her primary care physician.  Patient was asymptomatic.  She has monitored her blood pressure at home and notes higher blood pressure readings.  Patient has history of intermittent unexplained dizziness in the past.  Pt recently started on Savella for fibromyalgia.  Pt tapered off zoloft.  She has not experienced any withdrawal symptoms.  Pt also complains of pressure sensation over her bladder.  No fever or flank pain.    Current Allergies (reviewed today): ! SULFA ! CELEBREX ! HYDROCODONE  Past Medical History:    GERD    Anemia    Depression    Hyperlipiidemia    Osteopenia    S/P fracture of right shoulder & hip    Hypertension    Cystitis    Fibromyalgia    Chronic Insomnia    Pulmonary nodule - 5 mm anterior RUL - 12/08          Repeat CT of Chest 06/09 - no change    Social History:    Occupation:  works for Restaurant manager, fast food [employer]    Married    Former Smoker quit 35 years ago (5 pack year history)    Alcohol use-no      Review of Systems      See HPI   Physical Exam  General:     alert, well-developed, and well-nourished.   Eyes:     vision grossly intact, pupils equal, pupils round, and pupils reactive to light.    Neck:     supple and no masses.   Lungs:     normal respiratory effort and normal breath sounds.   Heart:     normal rate, regular rhythm, no murmur, and no gallop.   Abdomen:     soft and non-tender.   Extremities:     No lower extremity edema     Impression & Recommendations:  Problem # 1:  PALPITATIONS (ICD-785.1) Pt recently seen by her orthopedic physician.  They noted irregular heart beat.  She was asymptomatic but she has had elevated intermittent dizziness in the past.  Arrange cardiology follow up.  Recent TSH 05/09 normal.  Obtain Free T3 and T4.  Unclear whether palpitations related to start of Savella for fibromyalgia.  EKG shows NSR at 78 bpm.  No ST changes.  Orders: Cardiology Referral (Cardiology) EKG w/ Interpretation (93000)  Her updated medication list for this problem includes:    Bystolic 5 Mg Tabs (Nebivolol hcl) ..... One by mouth once daily   Problem # 2:  ESSENTIAL HYPERTENSION (ICD-401.9) Assessment: Improved BP worse.  Increase cozaar to 50 mg.  DC norvasc.  Add bystolic considering complaints of palpitations.  The following medications were removed from the medication list:    Cozaar 25 Mg Tabs (Losartan potassium) .Marland Kitchen... Take 1 tablet by mouth every morning    Norvasc 2.5 Mg Tabs (Amlodipine besylate) .Marland Kitchen... Take 1 tablet by mouth once a day  Her updated medication list for this problem includes:    Cozaar 50 Mg Tabs (Losartan potassium) ..... One by mouth once daily    Bystolic 5 Mg Tabs (Nebivolol hcl) ..... One by mouth once daily  BP today: 144/100 Prior BP: 118/82 (02/08/2008)  Labs Reviewed: Creat: 1.1 (12/04/2007) Chol: 135 (12/04/2007)   HDL: 44.7 (12/04/2007)   LDL: 72 (12/04/2007)   TG: 93 (12/04/2007)   Problem # 3:  URINARY FREQUENCY (ICD-788.41) Patient advised to call office if symptoms persist or worsen.  Her updated medication list for this problem includes:    Macrobid 100 Mg Caps (Nitrofurantoin monohyd macro) ..... One  by mouth bid  Orders: UA Dipstick w/o Micro (manual) (59563)   Complete Medication List: 1)  Crestor 20 Mg Tabs (Rosuvastatin calcium) .... One by mouth once daily 2)  Oscal 500/200 D-3 500-200 Mg-unit Tabs (Calcium-vitamin d) .... By mouth once daily 3)  Vivelle-dot 0.025 Mg/24hr Pttw (Estradiol) .... One twice weekly 4)  Fosamax 70 Mg Tabs (Alendronate sodium) .... One tablet by mouth once a week 5)  Omeprazole 20 Mg Tbec (Omeprazole) .... One by mouth two times a day ac 6)  Savella 50 Mg Tabs (Milnacipran hcl) .... One by mouth bid 7)  Cozaar 50 Mg Tabs (Losartan potassium) .... One by mouth once daily 8)  Bystolic 5 Mg Tabs (Nebivolol hcl) .... One by mouth once daily 9)  Macrobid 100 Mg Caps (Nitrofurantoin monohyd macro) .... One by mouth bid   Patient Instructions: 1)  Please schedule a follow-up appointment in 1 month. 2)  BMP prior to visit, ICD-9: 401.9 3)  T4, T3:  785.1   Prescriptions: MACROBID 100 MG  CAPS (NITROFURANTOIN MONOHYD MACRO) one by mouth bid  #10 x 0   Entered and Authorized by:   D. Thomos Lemons DO   Signed by:   D. Thomos Lemons DO on 02/25/2008   Method used:   Electronically sent to ...       [facility]*       [address]       [place], [place]  [id number]       Ph: [phone]       Fax: [phone]   RxID:   [id number] COZAAR 50 MG  TABS (LOSARTAN POTASSIUM) one by mouth once daily  #30 x 2   Entered and Authorized by:   D. Thomos Lemons DO   Signed by:   Glendell Docker CMA on 02/25/2008   Method used:   Electronically sent to ...       [facility]*       [address]       [place], [place]  [address]       Ph: [phone]       Fax: [phone]   RxID:   [id number]  ] Current Allergies (reviewed today): ! SULFA ! CELEBREX ! HYDROCODONE Laboratory Results   Urine Tests    Routine Urinalysis   Color: yellow Appearance: Clear Glucose: negative   (Normal Range: Negative) Bilirubin:  negative   (Normal Range: Negative) Ketone: negative   (Normal Range: Negative) Spec. Gravity:  1.015   (Normal Range: 1.003-1.035) Blood: small   (Normal Range: Negative) pH: 5.0   (Normal Range: 5.0-8.0) Protein: negative   (Normal Range: Negative) Urobilinogen: 0.2   (Normal Range: 0-1) Nitrite: negative   (Normal Range: Negative) Leukocyte Esterace: small   (Normal Range: Negative)

## 2010-09-02 NOTE — Progress Notes (Signed)
Summary: Lunesta side effects  Phone Note Call from Patient Call back at [facility] Phone [phone]   Caller: Patient Complaint: Headache Summary of Call: Patient called and states she has started the Lunesta last night and she got a terrible headache and no sleep.  She would like to know if there is something else she could take,or  will her side effects improve. She also stated that she will be leaving on vacation tommorrow. Initial call taken by: Glendell Docker,  February 13, 2008 11:39 AM  Follow-up for Phone Call        it may not improve.  DC lunesta.  Resume ambien for now.  Ok to refill previous [place] rx Follow-up by: D. Thomos Lemons DO,  February 13, 2008 12:24 PM  Additional Follow-up for Phone Call Additional follow up Details #1::        patient not available spoke with husband provided instructions per Dr Artist Pais. Phone number left to call if needed Additional Follow-up by: Glendell Docker,  February 13, 2008 5:42 PM

## 2010-09-02 NOTE — Miscellaneous (Signed)
Summary: Flu Vaccine/[facility]  Flu Vaccine/[facility]   Imported By: Lanelle Bal 05/31/2008 10:08:23  _____________________________________________________________________  External Attachment:    Type:   Image     Comment:   External Document

## 2010-09-02 NOTE — Progress Notes (Signed)
Summary: RESULTS   Phone Note Call from Patient Call back at Work Phone [phone]   Caller: Patient Call For: Missael Ferrari Reason for Call: Talk to Nurse Summary of Call: would like  biopsy results-nothing in mail today  Initial call taken by: Guadlupe [facility],  Dec 12, 2008 12:06 PM  Follow-up for Phone Call        pt aware of labs. Follow-up by: Harlow Mares CMA,  Dec 12, 2008 4:14 PM

## 2010-09-02 NOTE — Progress Notes (Signed)
Summary: Ambien CR  Phone Note Call from Patient Call back at Home Phone [phone] Call back at Work Phone [phone]   Summary of Call: Pt says that she did not get the rx for ambien CR, ok to call in to [place]? Initial call taken by: Lamar Sprinkles,  August 13, 2007 5:31 PM  Follow-up for Phone Call        yes  Follow-up by: D Thomos Lemons DO,  August 14, 2007 12:48 PM  Additional Follow-up for Phone Call Additional follow up Details #1::        rx called in  Additional Follow-up by: Lamar Sprinkles,  August 14, 2007 1:21 PM

## 2010-09-02 NOTE — Consult Note (Signed)
Summary: [facility]  [facility]   Imported By: Lanelle Bal 08/26/2008 13:53:36  _____________________________________________________________________  External Attachment:    Type:   Image     Comment:   External Document

## 2010-09-02 NOTE — Assessment & Plan Note (Signed)
Summary: 3 1/2 [facility] ROV /NWS $50   Vital Signs:  Patient Profile:   70 Years Old Female Height:     66 inches Weight:      132.50 pounds BMI:     21.46 Temp:     98.4 degrees F oral Pulse rate:   82 / minute Pulse rhythm:   regular Resp:     18 per minute BP sitting:   118 / 82  (left arm)  Vitals Entered By: Glendell Docker (February 08, 2008 9:06 AM)                 Chief Complaint:  3 WEEK FOLLOW UP .  History of Present Illness: 70 year old white female for follow-up regarding fibromyalgia.  On previous visit the patient was started on Savella titration pack.  Patient has noticed significant improvement in overall musculoskeletal pain.  Patient reports it took approximately 1 week for her to notice a difference.  She is currently taking 50 mg b.i.d.  Patient denies dizziness, nausea or changes in sleep pattern.  She has not monitored her BP at home.  We reviewed results of recent CT scan of her chest.  There was no change in small right upper lobe pulmonary nodule.    Current Allergies (reviewed today): ! SULFA ! CELEBREX ! 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   Head:     normocephalic and atraumatic.   Neck:     supple and no masses.   Lungs:     normal respiratory effort and normal breath sounds.   Heart:     normal rate, regular rhythm, and no gallop.   Abdomen:     soft and non-tender.   Extremities:     No lower extremity edema     Impression & Recommendations:  Problem # 1:  FIBROMYALGIA (ICD-729.1) Assessment: Improved Musculoskeletal complaints improved with Savella.  I advised patient to monitor her blood pressure readings 2 to 3 times per week.  I am encouraged by her good response.   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  Radiologist recommends follow-up scan and 24 months.  Problem # 3:  ESSENTIAL HYPERTENSION (ICD-401.9) Assessment: Unchanged BP stable.  BP with manual cuff 138/82.  Monitor BP at home. Pt will call office if SBP consistently < 140.  Her updated medication list for this problem includes:    Cozaar 25 Mg Tabs (Losartan potassium) .Marland Kitchen... Take 1 tablet by mouth every morning    Norvasc 2.5 Mg Tabs (Amlodipine besylate) .Marland Kitchen... Take 1 tablet by mouth once  a day  BP today: 118/82 Prior BP: 136/87 (01/16/2008)  Labs Reviewed: Creat: 1.1 (12/04/2007) Chol: 135 (12/04/2007)   HDL: 44.7 (12/04/2007)   LDL: 72 (12/04/2007)   TG: 93 (12/04/2007)   Problem # 4:  DEPRESSION (ICD-311) Pt will try tapering sertraline considering use of savella.  If any mood change, pt advised to restart sertraline.  Her updated medication list for this problem includes:    Zoloft 50 Mg Tabs (Sertraline hcl) .Marland Kitchen... Take 1 tablet by mouth once a day   Complete Medication List: 1)  Cozaar 25 Mg Tabs (Losartan potassium) .... Take 1 tablet by mouth every morning 2)  Crestor 20 Mg Tabs (Rosuvastatin calcium) .... One by mouth once daily 3)  Norvasc 2.5 Mg Tabs (Amlodipine besylate) .... Take 1 tablet by mouth once a day 4)  Oscal 500/200 D-3 500-200 Mg-unit Tabs (Calcium-vitamin d) .... By mouth once daily 5)  Vivelle-dot 0.025 Mg/24hr Pttw (Estradiol) .... One twice weekly 6)  Zoloft 50 Mg Tabs (Sertraline hcl) .... Take 1 tablet by mouth once a day 7)  Lunesta 2 Mg  Tabs (Eszopiclone) .... One by mouth qhs 8)  Fosamax 70 Mg Tabs (Alendronate sodium) .... One tablet by mouth once a week 9)  Omeprazole 20 Mg Tbec (Omeprazole) .... One by mouth two times a day ac 10)  Savella 50 Mg Tabs (Milnacipran hcl) .... One by mouth bid   Patient Instructions: 1)  Please schedule a follow-up appointment in 3 months.   Prescriptions: SAVELLA 50 MG  TABS (MILNACIPRAN HCL) one by mouth bid  #60 x 5   Entered and Authorized by:   D. Thomos Lemons DO   Signed by:   D. Thomos Lemons DO on 02/08/2008   Method used:   Electronically sent to ...       [facility]*       [address]       [place], [place]  [id number]       Ph: [phone]       Fax: [phone]   RxID:   [id number]  ] Current Allergies (reviewed today): ! SULFA ! CELEBREX ! HYDROCODONE

## 2010-09-02 NOTE — Miscellaneous (Signed)
Summary: Medication Refill  Clinical Lists Changes  Medications: Changed medication from ZOLOFT 50 MG  TABS (SERTRALINE HCL) by mouth once daily to ZOLOFT 50 MG  TABS (SERTRALINE HCL) Take 1 tablet by mouth once a day - Signed Changed medication from NORVASC 2.5 MG  TABS (AMLODIPINE BESYLATE) by mouth once daily to NORVASC 2.5 MG  TABS (AMLODIPINE BESYLATE) Take 1 tablet by mouth once a day - Signed Changed medication from COZAAR 25 MG  TABS (LOSARTAN POTASSIUM) by mouth once daily to COZAAR 25 MG  TABS (LOSARTAN POTASSIUM) Take 1 tablet by mouth every morning - Signed Rx of ZOLOFT 50 MG  TABS (SERTRALINE HCL) Take 1 tablet by mouth once a day;  #30 x 6;  Signed;  Entered by: Glendell Docker;  Authorized by: D. Thomos Lemons DO;  Method used: Electronic Rx of NORVASC 2.5 MG  TABS (AMLODIPINE BESYLATE) Take 1 tablet by mouth once a day;  #30 x 6;  Signed;  Entered by: Glendell Docker;  Authorized by: D. Thomos Lemons DO;  Method used: Electronic Rx of COZAAR 25 MG  TABS (LOSARTAN POTASSIUM) Take 1 tablet by mouth every morning;  #30 x 6;  Signed;  Entered by: Glendell Docker;  Authorized by: D. Thomos Lemons DO;  Method used: Electronic    Prescriptions: COZAAR 25 MG  TABS (LOSARTAN POTASSIUM) Take 1 tablet by mouth every morning  #30 x 6   Entered by:   Glendell Docker   Authorized by:   D. Thomos Lemons DO   Signed by:   Glendell Docker on 11/28/2007   Method used:   Electronically sent to ...       [facility]*       [address]       [place], [place]  [address]       Ph: [phone] or [phone]       Fax: [phone]   RxID:   [id number] NORVASC 2.5 MG  TABS (AMLODIPINE BESYLATE) Take 1 tablet by mouth once a day  #30 x 6   Entered by:   Glendell Docker   Authorized by:   D. Thomos Lemons DO   Signed by:   Glendell Docker on 11/28/2007   Method used:   Electronically sent to ...       [facility]*       [address]       [place], [place]  [address]       Ph: [phone] or [phone]       Fax: [phone]   RxID:   [id number] ZOLOFT 50 MG  TABS (SERTRALINE HCL) Take 1 tablet by mouth once a day  #30 x 6   Entered by:   Glendell Docker   Authorized by:   D. Thomos Lemons DO   Signed by:   Glendell Docker on 11/28/2007   Method used:   Electronically sent to ...       [facility]*       [address]       [place], [place]  [address]       Ph: [phone] or [phone]       Fax: [phone]   RxID:   [id number]

## 2010-09-02 NOTE — Progress Notes (Signed)
Summary: Xray Results  Phone Note Outgoing Call   Summary of Call: call pt - x ray of ankle negative Initial call taken by: D. Thomos Lemons DO,  April 02, 2009 5:13 PM  Follow-up for Phone Call        attempted to contact patient at [phone], no answer, voice message left advising patient per Dr Artist Pais instructions Follow-up by: Glendell Docker CMA,  April 03, 2009 8:17 AM

## 2010-09-02 NOTE — Assessment & Plan Note (Signed)
Summary: dr Artist Pais pt-stomach pain x 1week-ch   Vital Signs:  Patient Profile:   70 Years Old Female Height:     66 inches Weight:      134 pounds BMI:     21.71 O2 treatment:    Room Air Temp:     98.1 degrees F oral Pulse rate:   78 / minute Pulse rhythm:   regular Resp:     20 per minute BP sitting:   124 / 78  (right arm) Cuff size:   regular  Vitals Entered By: Darra Lis RMA (July 02, 2008 3:17 PM)                 Visit Type:  acute PCP:  Dondra Spry DO  Chief Complaint:  lower abd pain/fever and Abdominal pain.  History of Present Illness: Patient c/o lower abd pressure/pain with diarrhea and nausea.  She has had urinary symptoms  intermittently for months. Patient c/o urgency and frequency - including going to the bathroom 2 to 3 times a night.  Patient has had a low grade fever Monday evening.    She states she was given antibiotics in 01/2008 for urinary symptoms by Dr. Artist Pais - but she did not take them.  She returned 03/2008 with similar symptoms and was again Rx'd antibiotics - she took them and felt better, but symptoms never totally resolved.  Now, the symptoms have worsened for the past 3 weeks with increased frequency, urgency and lower abdominal pressure/pain.  The diarrhea and nausea has only been the last few days and she only had a low grade temp on Monday - no fever any other time.    The patient denies vomiting, constipation, melena, hematochezia, anorexia, and hematemesis.  The patient denies the following symptoms: weight loss, chest pain, jaundice, dark urine, and vaginal bleeding.  Nothing seems to make the symptoms better or worse - except the antibiotics mentioned above that did help, but not totally relieve the symptoms.    She also gives a history of IBS which could be contributing to her symptoms of diarrhea and stomach cramps.    Updated Prior Medication List: CRESTOR 20 MG TABS (ROSUVASTATIN CALCIUM) one by mouth once daily OSCAL 500/200  D-3 500-200 MG-UNIT  TABS (CALCIUM-VITAMIN D) by mouth once daily VIVELLE-DOT 0.025 MG/24HR  PTTW (ESTRADIOL) one twice weekly FOSAMAX 70 MG  TABS (ALENDRONATE SODIUM) one tablet by mouth once a week COZAAR 25 MG TABS (LOSARTAN POTASSIUM) one by mouth once daily BYSTOLIC 5 MG  TABS (NEBIVOLOL HCL) one by mouth once daily SERTRALINE HCL 50 MG TABS (SERTRALINE HCL) 1/2 by mouth once daily x 1 wk, then one by mouth qd FLUTICASONE PROPIONATE 50 MCG/ACT SUSP (FLUTICASONE PROPIONATE) 2 sprays each nostril once daily ZOLPIDEM TARTRATE 10 MG TABS (ZOLPIDEM TARTRATE) one by mouth at bedtime prn  Current Allergies (reviewed today): ! SULFA ! CELEBREX ! HYDROCODONE  Past Medical History:    Reviewed history from 03/27/2008 and no changes required:       IBS       GERD       Anemia       Depression       Hyperlipiidemia       Osteopenia       S/P fracture of right shoulder & hip       Hypertension       Cystitis       Fibromyalgia       Chronic Insomnia  Pulmonary nodule - 5 mm anterior RUL - 12/08             Repeat CT of Chest 06/09 - no change    Past Surgical History:    Reviewed history from 03/27/2008 and no changes required:       EGD       Total Abdominal Hysterectomy W/ BSO       Cholecystectomy    Family History:    Reviewed history from 08/09/2007 and no changes required:       Mother had MI at age  16       Father - COPD       Siblings with history of nephritis  Social History:    Reviewed history from 03/27/2008 and no changes required:       Occupation:  works for [employer]       Married       Former Smoker quit 35 years ago (5 pack year history)       Alcohol use-no              Review of Systems  The patient denies weight loss, weight gain, chest pain, dyspnea on exertion, and headaches.     Physical Exam  General:     alert, well-developed, and well-nourished.  no acute distress Head:     normocephalic and atraumatic.   Eyes:      vision grossly intact, conjuctiva clear Lungs:     normal respiratory effort and normal breath sounds.  no crackles and no wheezes.   Heart:     normal rate, regular rhythm, and no gallop.   Abdomen:     soft, no guarding, no rebound, some diffuse tenderness in the lower abdomen - nonfocal.  normal bowel sounds, no distention, no masses, no abdominal hernia, no hepatomegaly, and no splenomegaly.   Extremities:     No lower extremity edema  Psych:     normally interactive, good eye contact, not anxious appearing, and not depressed appearing.      Impression & Recommendations:  Problem # 1:  ACUTE CYSTITIS (ICD-595.0) Urine with trace leuk and moderate blood.  The history sounds like she has had a partially treated cystitis with intermittent symptoms for several months.  At this point, will start cipro and get a urine cx.  I have also reviewed with the patient that there are other possible etiologies for her symptoms that would require immediate medical intervention.  Reviewed "red flag" symptoms and patient agrees to seek immediate medical attention if they occur.  Patient to call if symptoms increase / change / persist or any concerns.  She is to follow up with Dr. Artist Pais next week to make sure she has full resolution of her symptoms this time. Orders: T-Urine Culture (Spectrum Order) [id number]) Specimen Handling (09811)  Her updated medication list for this problem includes:    Ciprofloxacin Hcl 250 Mg Tabs (Ciprofloxacin hcl) ..... One tab by mouth two times a day   Problem # 2:  IRRITABLE BOWEL SYNDROME (ICD-564.1) patient also gives a history of IBS which could be contributing to her overall symptoms with the diarrhea and stomach cramps.  Complete Medication List: 1)  Crestor 20 Mg Tabs (Rosuvastatin calcium) .... One by mouth once daily 2)  Oscal 500/200 D-3 500-200 Mg-unit Tabs (Calcium-vitamin d) .... By mouth once daily 3)  Vivelle-dot 0.025 Mg/24hr Pttw (Estradiol) .... One  twice weekly 4)  Fosamax 70 Mg Tabs (Alendronate sodium) .... One  tablet by mouth once a week 5)  Cozaar 25 Mg Tabs (Losartan potassium) .... One by mouth once daily 6)  Bystolic 5 Mg Tabs (Nebivolol hcl) .... One by mouth once daily 7)  Sertraline Hcl 50 Mg Tabs (Sertraline hcl) .... 1/2 by mouth once daily x 1 wk, then one by mouth qd 8)  Fluticasone Propionate 50 Mcg/act Susp (Fluticasone propionate) .... 2 sprays each nostril once daily 9)  Zolpidem Tartrate 10 Mg Tabs (Zolpidem tartrate) .... One by mouth at bedtime prn 10)  Ciprofloxacin Hcl 250 Mg Tabs (Ciprofloxacin hcl) .... One tab by mouth two times a day  Other Orders: UA Dipstick w/o Micro (manual) (16109)   Patient Instructions: 1)  Please schedule a follow-up appointment in 1 weeks with Dr. Artist Pais   Prescriptions: CIPROFLOXACIN HCL 250 MG TABS (CIPROFLOXACIN HCL) one tab by mouth two times a day  #14 x 0   Entered and Authorized by:   Paulo Fruit MD   Signed by:   Paulo Fruit MD on 07/02/2008   Method used:   Electronically to        [facility]* (retail)       [address]       [place], [place]  [id number]       Ph: [phone]       Fax: [phone]   RxID:   [id number]  ] Laboratory Results   Urine Tests  Date/Time Received: July 02, 2008 3:28 PM  Date/Time Reported: July 02, 2008 3:28 PM   Routine Urinalysis   Color: yellow Appearance: Clear Glucose: negative   (Normal Range: Negative) Bilirubin: negative   (Normal Range: Negative) Ketone: negative   (Normal Range: Negative) Spec. Gravity: 1.010   (Normal Range: 1.003-1.035) Blood: moderate   (Normal Range: Negative) pH: 5.0   (Normal Range: 5.0-8.0) Protein: negative   (Normal Range: Negative) Urobilinogen: 0.2   (Normal Range: 0-1) Nitrite: negative   (Normal Range: Negative) Leukocyte Esterace: trace   (Normal Range: Negative)    Comments: Darra Lis RMA  July 02, 2008 3:28 PM

## 2010-09-02 NOTE — Progress Notes (Signed)
Summary: Crestor Refill  Phone Note Refill Request Message from:  Fax from Pharmacy on March 19, 2010 9:38 AM  Refills Requested: Medication #1:  CRESTOR 20 MG TABS one by mouth once daily   Dosage confirmed as above?Dosage Confirmed   Brand Name Necessary? No   Supply Requested: 1 month  Method Requested: Electronic Next Appointment Scheduled: Dr Jonny Ruiz 06/14/10 Initial call taken by: Glendell Docker CMA,  March 19, 2010 9:39 AM  Follow-up for Phone Call        ok to refill x 3 I suggest she schedule FLP and LFTs before her next OV with Dr. Jonny Ruiz Follow-up by: D. Thomos Lemons DO,  March 19, 2010 1:20 PM  Additional Follow-up for Phone Call Additional follow up Details #1::        call placed to patient at [phone], no answer, detailed voice message left informing patient per Dr Artist Pais instructions Additional Follow-up by: Glendell Docker CMA,  March 22, 2010 8:11 AM    Prescriptions: CRESTOR 20 MG TABS (ROSUVASTATIN CALCIUM) one by mouth once daily  #30 x 3   Entered by:   Glendell Docker CMA   Authorized by:   D. Thomos Lemons DO   Signed by:   Glendell Docker CMA on 03/19/2010   Method used:   Electronically to        [facility]* (retail)       [address]       [place], [place]  [id number]       Ph: [phone]       Fax: [phone]   RxID:   [id number]

## 2010-09-02 NOTE — Assessment & Plan Note (Signed)
Summary: FU-DISCUSS HAVING SURGERY /NWS/STC   Vital Signs:  Patient Profile:   70 Years Old Female Height:     66 inches Weight:      136 pounds BMI:     22.03 Temp:     97.1 degrees F oral Pulse rate:   90 / minute BP sitting:   119 / 76  (right arm)  Vitals Entered By: Glendell Docker (August 09, 2007 2:57 PM)                 Chief Complaint:  Follow up appt to discuss test results and mediaction.  History of Present Illness:  Follow-Up Visit      This is a 70 year old woman who presents for Follow-up visit.  The patient denies chest pain and SOB.  Since the last visit the patient notes being seen by a specialist.  The patient reports taking meds as prescribed.  Her orthopedist performed MRI of shoulder which showed possbile lung nodule.  CT of chest was ordered which showed RUL 5 mm nodule.  It did not show any adenopathy.  She is scheduled for elective rotator cuff surgery.    Current Allergies (reviewed today): ! SULFA ! CELEBREX ! HYDROCODONE  Past Medical History:    Reviewed history from 04/07/2007 and no changes required:       GERD       Anemia       Depression       Hyperlipiidemia       Osteopenia       S/P fracture of right shoulder & hip       Hypertension       Cystitis       Fibromyalgia   Family History:    Mother had MI at age  32    Father - COPD    Siblings with history of nephritis  Social History:    Occupation:  works for Restaurant manager, [employer]    Married    Former Smoker quit 35 years ago (5 pack year history)    Alcohol use-no   Risk Factors:  Tobacco use:  quit Alcohol use:  no   Review of Systems      See HPI   Physical Exam  General:     alert, well-developed, and well-nourished.   Neck:     No deformities, masses, or tenderness noted. Lungs:     Normal respiratory effort, chest expands symmetrically. Lungs are clear to auscultation, no crackles or wheezes. Heart:     normal rate, regular rhythm, and no murmur.    Abdomen:     soft, non-tender, no hepatomegaly, and no splenomegaly.   Extremities:     No lower extremity edema  Cervical Nodes:     no anterior cervical adenopathy and no posterior cervical adenopathy.      Impression & Recommendations:  Problem # 1:  PULMONARY NODULE, RIGHT UPPER LOBE (ICD-518.89) Incidental RUL 5mm nodule.  She has remote history of tobacco use.  She quit 35 years ago.  (5 pack year history) She is asymptomatic.  No PET scan considering current size. I recommend repeat CT of Chest in 6 months.   Problem # 2:  INSOMNIA-SLEEP DISORDER-UNSPEC (ICD-307.40) She has been using generic ambien 10 mg by mouth as needed but awakens within 2-3 hours. Trial of Ambien CR 12.5 mg by mouth at bedtime as needed.  Complete Medication List: 1)  Cozaar 25 Mg Tabs (Losartan potassium) .... By mouth once  daily 2)  Crestor 20 Mg Tabs (Rosuvastatin calcium) .... One by mouth once daily 3)  Nexium 40 Mg Cpdr (Esomeprazole magnesium) .... By mouth once daily 4)  Norvasc 2.5 Mg Tabs (Amlodipine besylate) .... By mouth once daily 5)  Oscal 500/200 D-3 500-200 Mg-unit Tabs (Calcium-vitamin d) .... By mouth once daily 6)  Vivelle-dot 0.025 Mg/24hr Pttw (Estradiol) .... One twice weekly 7)  Zoloft 50 Mg Tabs (Sertraline hcl) .... By mouth once daily 8)  Colace 100 Mg Caps (Docusate sodium) .... Take 1 tablet by mouth two times a day as needed 9)  Ambien Cr 12.5 Mg Tbcr (Zolpidem tartrate) .... One by mouth at bedtime prn 10)  Eql Omeprazole 20 Mg Tbec (Omeprazole) .... 2 by mouth once daily ac     Prescriptions: AMBIEN CR 12.5 MG  TBCR (ZOLPIDEM TARTRATE) one by mouth at bedtime prn  #30 x 0   Entered and Authorized by:   Dondra Spry DO   Signed by:   Dondra Spry DO on 08/09/2007   Method used:   Print then Give to Patient   RxID:   [id number] EQL OMEPRAZOLE 20 MG  TBEC (OMEPRAZOLE) 2 by mouth once daily ac  #60 x 5   Entered and Authorized by:   Dondra Spry DO   Signed  by:   Dondra Spry DO on 08/09/2007   Method used:   Electronically sent to ...       [facility]*       [address]       [place], [place]  [address]       Ph: [phone] or [phone]       Fax: [phone]   RxID:   [id number]  ]  Preventive Care Screening  Bone Density:    Date:  08/31/2005    Results:  Done std dev

## 2010-09-02 NOTE — Assessment & Plan Note (Signed)
Summary: Pulmonary cough 98% better off nasal steroids, ok to wean H2hs   Copy to:  Dr. Thomos Lemons Primary Provider/Referring Provider:  Dr Excell Seltzer  CC:  Cough- resolved.  History of Present Illness: 2  yowf quit smoking in 1977 with no resp problems chronically limited by right hip problems not sob.  March 04, 2010 cc paroxysms of sob not necessarily related to ex but happening nightly x 1 year indolent onset initially, not progressive, usually around 2 am minimal assoc dry cough and no diaphoreisis or  cp. No fluctuation.      has taken macrobid in past for uti's.    rec No macrobid  Zantac 150 mg one with bfast and one at bedtime Increaese cozaar to 50 mg in am  April 15, 2010 6 wk followup with PFT's.  Pt states that her cough is the same- no better or worse.  She states that "there is always something in throat". Her breathing is the same- no better or worse. constant sensation of globus, occ noct awakening with cough, remains mostly dry.  mild doe only. See Patient Care Coordinator before leaving for sinus ct Nexium 40 Take  one 30-60 min before first meal of the day  along with pepcid  20 mg at bedtime Prednisone 10 4 each am x 2days, 2x2days, 1x2days and stop for sensation of throat tickle or drainage use chlortrimeton 4 mg one  at bedtime and as needed during the day 6 hours   (did not really give this a fair try) Take delsym two tsp every 12 hours and add tramadol 50 mg up to 1-2 every 4 hours  May 20, 2010  cc .cough improved p rx with prednisone but then worse, better while on fluticasone also and now  pt c/o chest congestion assoc with mild nasal congestion.  Nexium 40 Take  one 30-60 min before first meal of the day  along with pepcid  20 mg at bedtime for sensation of throat tickle or drainage use chlortrimeton 4 mg one  at bedtime and as needed during the day 6 hours   Fluticasone  plus afrin  July 07, 2010 ov  cc cough 98% better off flonase now and no sob,   sore throat, dysphagia, itching, sneezing,  nasal congestion or excess secretions,  fever, chills, sweats, unintended wt loss, pleuritic or exertional cp, hempoptysis, change in activity tolerance  orthopnea pnd or leg swelling       Current Medications (verified): 1)  Crestor 20 Mg Tabs (Rosuvastatin Calcium) .... One By Mouth Once Daily 2)  Oscal 500/200 D-3 500-200 Mg-Unit  Tabs (Calcium-Vitamin D) .... By Mouth Once Daily 3)  Sertraline Hcl 100 Mg Tabs (Sertraline Hcl) .... One By Mouth Once Daily 4)  Multivitamins   Tabs (Multiple Vitamin) .Marland Kitchen.. 1 By Mouth Once Daily 5)  Zolpidem Tartrate 10 Mg Tabs (Zolpidem Tartrate) .... One By Mouth At Bedtime Prn 6)  Gabapentin 100 Mg Caps (Gabapentin) .... One To Two Tabs By Mouth At Bedtime As Directed 7)  Cozaar 50 Mg  Tabs (Losartan Potassium) .... One Tablet By Mouth Daily 8)  Nexium 40 Mg  Cpdr (Esomeprazole Magnesium) .... By Mouth Daily. Take One Half Hour Before Eating. 9)  Tramadol Hcl 50 Mg  Tabs (Tramadol Hcl) .... One To Two By Mouth Every 4-6 Hours 10)  Chlor-Trimeton 4 Mg Tabs (Chlorpheniramine Maleate) .... One Every 6 Hours If Needed  Allergies (verified): 1)  ! Sulfa 2)  ! Celebrex  3)  ! Hydrocodone  Past History:  Past Medical History: IBS GERD Anemia  Depression     Hyperlipiidemia Osteopenia S/P fracture of right shoulder & hip  - Dr Thurston Hole, with post fx right shoulder adhesive capsultiis hx of mild left shoulder adhesive capsulitis 2009 - no surgury Hypertension Cystitis Fibromyalgia Chronic Insomnia Pulmonary nodule - 5 mm anterior RUL - 12/08       Repeat CT of Chest 06/09 - no change         Repeat CT RUL 3 mm Triad 02/19/10 Palpitations Exertional dyspnea     - No sign desats x 185 x3 March 04, 2010  Fatigue Polycystic kidney - followed by Dr. 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 Chronic cough  ...........................................................................Marland KitchenWert    - PFT's nl x for truncation of fv loop April 15, 2010     - Sinus ct rec April 15, 2010  > nl     - Allergy profile sent May 20, 2010> nl     - Trial of Nasal steroids as maint rx May 21, 2010 >>> resolved, stopped flonase s flare  Past Surgical History: Total Abdominal Hysterectomy W/ BSO - due to dysfunctional uterine bleeding and endometriosis Cholecystectomy      s/p right shoulder rotater cuff tear/adhesive capsulitis - 2002 hx of right humerus fracture 2002 s/p right hip sugury  2002  stress test neg oct 2009 SB0 > lap 06/16/10 for adhesions and small internal hernia   Vital Signs:  Patient profile:   70 year old female Weight:      131 pounds O2 Sat:      97 % on Room air Temp:     97.7 degrees F oral Pulse rate:   80 / minute BP sitting:   130 / 66  (left arm)  Vitals Entered By: Vernie Murders (July 07, 2010 3:56 PM)  O2 Flow:  Room air  Physical Exam  Additional Exam:   wt 139 > 141 March 04, 2010 > 141 April 15, 2010 > 137 May 20, 2010 > 131 July 07, 2010  amb anxious hoarse wf nad HEENT mild turbinate edema.  Oropharynx no thrush or excess pnd or cobblestoning.  No JVD or cervical adenopathy. Mild accessory muscle hypertrophy. Trachea midline, nl thryroid. Chest was hyperinflated by percussion with diminished breath sounds and moderate increased exp time without wheeze. Hoover sign positive at mid inspiration. Regular rate and rhythm without murmur gallop or rub or increase P2 or edema.  Abd: no hsm, nl excursion. Ext warm without cyanosis or clubbing.     Impression & Recommendations:  Problem # 1:  COUGH, CHRONIC (ICD-786.2)  Classic Upper airway cough syndrome, so named because it's frequently impossible to sort out how much is  CR/sinusitis with freq throat clearing (which can be related to primary GERD)   vs  causing  secondary extra esophageal  GERD from wide swings in gastric pressure that occur with throat clearing, promoting self use of mint and menthol lozenges that reduce the lower esophageal sphincter tone and exacerbate the problem further . These are the same pts who not infrequently have failed to tolerate ace inhibitors,  dry powder inhalers or biphosphonates or report having reflux symptoms that don't respond to standard doses of PPI   Sinus Ct is neg so most likely mech is post nasal drip from chronic rhinitis, perhaps exac by GERD which is  secondary to cough via a cyclical mechanism ( gerd is to cough what oxygen is to fire)    Each maintenance medication was reviewed in detail including most importantly the difference between maintenance and as needed and under what circumstances the prns are to be used. See instructions for specific recommendations      Orders: Est. Patient Level IV (32440)  Problem # 2:  ABNORMAL LUNG XRAY (ICD-793.1) Ct Chest 06/11/10 just shows mild atx in post r base with minimal effusion typical of a pt with acute abdominal issues (ultimately required lap) and now asymptomatic so no f/u studies needed  Medications Added to Medication List This Visit: 1)  Sertraline Hcl 100 Mg Tabs (Sertraline hcl) .... One by mouth once daily 2)  Pepcid 20 Mg Tabs (Famotidine) .... Take one by mouth at bedtime  Patient Instructions: 1)  Upper airway cough syndrome  (IRWIN)  2)  Try off pepcid after the holidays 3)  Please schedule a follow-up appointment in 8  weeks, sooner if needed

## 2010-09-02 NOTE — Progress Notes (Signed)
Summary: Prolia  Phone Note Other Incoming   Summary of Call: Prolia has been approved with $0 out of pocket for the patient. Called to notfiy, left message on machine to call back to office. Initial call taken by: Lucious Groves,  December 31, 2009 11:50 AM  Follow-up for Phone Call        Patient notified and will call back one week prior to when she wants to receive injection. Follow-up by: Lucious Groves,  December 31, 2009 1:19 PM

## 2010-09-02 NOTE — Progress Notes (Signed)
Summary: Results  Phone Note Call from Patient Call back at Home Phone [phone] Call back at Work Phone [phone]   Caller: Patient Summary of Call: Requesting results of U/S. Initial call taken by: Zackery Barefoot CMA,  January 22, 2008 9:30 AM  Follow-up for Phone Call        Patient aware meesage left Follow-up by: Glendell Docker,  January 23, 2008 8:41 AM

## 2010-09-02 NOTE — Assessment & Plan Note (Signed)
Summary: f/u-ch   Vital Signs:  Patient profile:   70 year old female Weight:      143.50 pounds BMI:     23.25 Temp:     98.1 degrees F oral Pulse rate:   80 / minute Resp:     18 per minute BP sitting:   112 / 72  (left arm) Cuff size:   regular  Vitals Entered By: Glendell Docker CMA (March 10, 2009 3:44 PM)  Primary Care Provider:  Dondra Spry DO   History of Present Illness: 70 y/o white female w hx of fibromyalgia, chronic insomnia and htn for follow up.  She has not been sleeping well.  Increased stress at work.  Husband has significant health issues.    GERD - fosamax irritates her stomach.  She also misses doses.    Preventive Screening-Counseling & Management  Alcohol-Tobacco     Alcohol drinks/day: 0     Alcohol Counseling: not indicated; patient does not drink     Smoking Status: quit     Year Quit: 1976     Tobacco Counseling: not to resume use of tobacco products  Caffeine-Diet-Exercise     Caffeine use/day: 1 cup coffe daily     Caffeine Counseling: not indicated; caffeine use is not excessive or problematic     Does Patient Exercise: no  Allergies: 1)  ! Sulfa 2)  ! Celebrex 3)  ! Hydrocodone  Past History:  Past Medical History: IBS GERD Anemia Depression  Hyperlipiidemia Osteopenia S/P fracture of right shoulder & hip Hypertension Cystitis Fibromyalgia Chronic Insomnia Pulmonary nodule - 5 mm anterior RUL - 12/08       Repeat CT of Chest 06/09 - no change    Palpitations Exertional dyspnea Fatigue  Social History: Caffeine use/day:  1 cup coffe daily  Physical Exam  General:  alert, well-developed, and well-nourished.   Head:  normocephalic and atraumatic.   Neck:  Supple; no masses or thyromegaly. Lungs:  Clear throughout to auscultation. Heart:  Regular rate and rhythm; no murmurs, rubs,  or bruits. Extremities:  No clubbing, cyanosis, edema or deformities noted. Neurologic:  cranial nerves II-XII intact and gait  normal.   Psych:  normally interactive, good eye contact, not anxious appearing, and not depressed appearing.     Impression & Recommendations:  Problem # 1:  INSOMNIA, CHRONIC (ICD-307.42) Pt has developed tolerance to zolpidem.  She occ takes two 10 mg tabs.   I advised against taking higher than 10 mg.   She has hx of fibromyalgia.  Trial of gabapentin at bedtime.  Problem # 2:  OSTEOPOROSIS (ICD-733.00) Pt has hx of GERD.  Fosamax irritating her GI tract.  I suggest ReClast infusions.  The following medications were removed from the medication list:    Fosamax 70 Mg Tabs (Alendronate sodium) ..... One tablet by mouth once a week  Orders: Misc. Referral (Misc. 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  Asymptomatic.  Monitor for now.  Complete Medication List: 1)  Crestor 20 Mg Tabs (Rosuvastatin calcium) .... One by mouth once daily 2)  Oscal 500/200 D-3 500-200 Mg-unit Tabs (Calcium-vitamin d) .... By mouth once daily 3)  Cozaar 25 Mg Tabs (Losartan potassium) .... One by mouth once daily 4)  Bystolic 5 Mg Tabs (Nebivolol hcl) .... One by mouth once daily 5)  Sertraline Hcl 100 Mg Tabs (Sertraline hcl) .... One by mouth qd 6)  Multivitamins Tabs (Multiple vitamin) .Marland KitchenMarland KitchenMarland Kitchen 1  by mouth once daily 7)  Zolpidem Tartrate 10 Mg Tabs (Zolpidem tartrate) .... One by mouth at bedtime prn 8)  Omeprazole 20 Mg Cpdr (Omeprazole) .... Take 1 capsule by mouth two times a day 9)  Gabapentin 100 Mg Caps (Gabapentin) .... One by mouth qhs  Patient Instructions: 1)  Please schedule a follow-up appointment in 3 months. 2)  CBC w/ Diff prior to visit, ICD-9:  287.5 3)  Please return for lab work one (1) week before your next appointment.  Prescriptions: COZAAR 25 MG TABS (LOSARTAN POTASSIUM) one by mouth once daily  #30 x 5   Entered and Authorized by:   D. Thomos Lemons DO   Signed by:   D. Thomos Lemons DO on 03/10/2009   Method used:   Electronically  to        [facility]* (retail)       [address] [place], [place]  [address]       Ph: [phone]       Fax: [phone]   RxID:   [id number] CRESTOR 20 MG TABS (ROSUVASTATIN CALCIUM) one by mouth once daily  #30 Each x 5   Entered and Authorized by:   D. Thomos Lemons DO   Signed by:   D. Thomos Lemons DO on 03/10/2009   Method used:   Electronically to        [facility]* (retail)       [address] [place], [place]  [address]       Ph: [phone]       Fax: [phone]   RxID:   [id number] ZOLPIDEM TARTRATE 10 MG TABS (ZOLPIDEM TARTRATE) one by mouth at bedtime prn  #90 x 1   Entered and Authorized by:   D. Thomos Lemons DO   Signed by:   D. Thomos Lemons DO on 03/10/2009   Method used:   Print then Give to Patient   RxID:   [id number] GABAPENTIN 100 MG CAPS (GABAPENTIN) one by mouth qhs  #30 x 2   Entered and Authorized by:   D. Thomos Lemons DO   Signed by:   D. Thomos Lemons DO on 03/10/2009   Method used:   Electronically to        [facility]* (retail)       [address]       [place], [place]  [address]       Ph: [phone]       Fax: [phone]   RxID:   [id number]    Preventive Care Screening  Pap Smear:    Date:  01/20/2009    Results:  normal    Current Allergies (reviewed today): ! SULFA ! CELEBREX ! HYDROCODONE

## 2010-09-02 NOTE — Assessment & Plan Note (Signed)
Summary: Pulmonary/ new pt eval for sob >> cough   Visit Type:  Initial Consult Copy to:  Dr. Thomos Lemons Primary Provider/Referring Provider:  D. Thomos Lemons DO  CC:  SOB.  History of Present Illness: 16 yowf quit smoking in 1977 with no resp problems chronically limited by right hip problems not sob.  March 04, 2010 cc paroxysms of sob not necessarily related to ex but happening nightly x 1 year indolent onset initially, not progressive, usually around 2 am minimal assoc dry cough and no diaphoreisis or  cp. No fluctuation.    Pt denies any significant  dysphagia, itching, sneezing,  nasal congestion or excess secretions,  fever, chills, sweats, unintended wt loss, pleuritic or exertional cp, hempoptysis, change in activity tolerance  related to sob,  or leg swelling. Pt also denies any obvious fluctuation in symptoms with weather or environmental change or other alleviating or aggravating factors.     has taken macrobid in past for uti's.  Current Medications (verified): 1)  Crestor 20 Mg Tabs (Rosuvastatin Calcium) .... One By Mouth Once Daily 2)  Oscal 500/200 D-3 500-200 Mg-Unit  Tabs (Calcium-Vitamin D) .... By Mouth Once Daily 3)  Cozaar 25 Mg Tabs (Losartan Potassium) .... One By Mouth Once Daily 4)  Sertraline Hcl 100 Mg Tabs (Sertraline Hcl) .... One By Mouth Qd 5)  Multivitamins   Tabs (Multiple Vitamin) .Marland Kitchen.. 1 By Mouth Once Daily 6)  Zolpidem Tartrate 10 Mg Tabs (Zolpidem Tartrate) .... One By Mouth At Bedtime Prn 7)  Ranitidine Hcl 300 Mg Caps (Ranitidine Hcl) .Marland Kitchen.. 1po Once Daily 8)  Gabapentin 100 Mg Caps (Gabapentin) .... One To Two Tabs By Mouth At Bedtime As Directed  Allergies (verified): 1)  ! Sulfa 2)  ! Celebrex 3)  ! Hydrocodone  Past History:  Past Medical History: IBS GERD Anemia  Depression     Hyperlipiidemia Osteopenia S/P fracture of right shoulder & hip  - Dr Thurston Hole, with post fx right shoulder adhesive capsultiis hx of mild left shoulder  adhesive capsulitis 2009 - no surgury Hypertension Cystitis Fibromyalgia Chronic Insomnia Pulmonary nodule - 5 mm anterior RUL - 12/08       Repeat CT of Chest 06/09 - no change         Repeat CT RUL 3 mm Triad 02/19/10 Palpitations Exertional dyspnea     - No sign desats x 185 x3 March 04, 2010  Fatigue Polycystic kidney - followed by Dr. 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  Family History: Mother had MI at age  44 Father - COPD Siblings with history of nephritis Family History of Colon Cancer: grandmother  sister has hypothyroidism mother had thyroidectomy (benign)    Emphysema- Mother Negative for asthma  Social History: Occupation:  works for [employer] Former Smoker quit 1977 (5 pack year history) Alcohol use-no   Illicit Drug Use - no  husband died - met prostate ca 2011/04/07Widow/Widower  Review of Systems       The patient complains of shortness of breath with activity, shortness of breath at rest, non-productive cough, chest pain, irregular heartbeats, acid heartburn, abdominal pain, sore throat, tooth/dental problems, headaches, hand/feet swelling, and joint stiffness or pain.  The patient denies productive cough, coughing up blood, indigestion, loss of appetite, weight change, difficulty swallowing, nasal congestion/difficulty breathing through nose, sneezing, itching, ear ache, anxiety, depression, rash, change in color of  mucus, and fever.    Vital Signs:  Patient profile:   70 year old female Weight:      141 pounds O2 Sat:      96 % on Room air Temp:     97.9 degrees F oral Pulse rate:   72 / minute BP sitting:   132 / 78  (left arm)  Vitals Entered By: Vernie Murders (March 04, 2010 8:38 AM)  O2 Flow:  Room air  Serial Vital Signs/Assessments:  Comments: 9:02 AM Ambulatory Pulse Oximetry  Resting; HR___85__    02 Sat__94% on room  air___  Lap1 (185 feet)   HR__96___   02 Sat__90% on room air___ Lap2 (185 feet)   HR__113___   02 Sat_89% on room air____    Lap3 (185 feet)   HR__99___   02 Sat_90% on room air____  _x__Test Completed without Difficulty ___Test Stopped due to:  By: Michel Bickers CMA    Physical Exam  Additional Exam:   wt 139 > 141 March 04, 2010  HEENT mild turbinate edema.  Oropharynx no thrush or excess pnd or cobblestoning.  No JVD or cervical adenopathy. Mild accessory muscle hypertrophy. Trachea midline, nl thryroid. Chest was hyperinflated by percussion with diminished breath sounds and moderate increased exp time without wheeze. Hoover sign positive at mid inspiration. Regular rate and rhythm without murmur gallop or rub or increase P2 or edema.  Abd: no hsm, nl excursion. Ext warm without cyanosis or clubbing.     Sodium                    144 mEq/L                   135-145   Potassium                 4.2 mEq/L                   3.5-5.1   Chloride                  107 mEq/L                   96-112   Carbon Dioxide            32 mEq/L                    19-32   Glucose                   88 mg/dL                    16-10   BUN                  [H]  24 mg/dL                    9-60   Creatinine                1.2 mg/dL                   4.5-4.0   Calcium                   9.5 mg/dL                   9.8-11.9   GFR  46.61 mL/min                >60  Tests: (2) CBC Platelet w/Diff (CBCD)   White Cell Count          5.3 K/uL                    4.5-10.5   Red Cell Count            4.03 Mil/uL                 3.87-5.11   Hemoglobin                12.2 g/dL                   11.9-14.7   Hematocrit           [L]  35.8 %                      36.0-46.0   MCV                       88.8 fl                     78.0-100.0   MCHC                      34.0 g/dL                   82.9-56.2   RDW                  [H]  14.7 %                      11.5-14.6   Platelet Count             178.0 K/uL                  150.0-400.0   Neutrophil %              66.7 %                      43.0-77.0   Lymphocyte %              21.2 %                      12.0-46.0   Monocyte %                7.8 %                       3.0-12.0   Eosinophils%              3.7 %                       0.0-5.0   Basophils %               0.6 %                       0.0-3.0   Neutrophill Absolute      3.5 K/uL                    1.4-7.7   Lymphocyte Absolute  1.1 K/uL                    0.7-4.0   Monocyte Absolute         0.4 K/uL                    0.1-1.0  Eosinophils, Absolute                             0.2 K/uL                    0.0-0.7   Basophils Absolute        0.0 K/uL                    0.0-0.1  Tests: (3) B-Type Natiuretic Peptide (BNPR)  B-Type Natriuetic Peptide                             40.1 pg/mL                  0.0-100.0  Tests: (4) Sed Rate (ESR)   Sed Rate                  16 mm/hr                    0-22  Impression & Recommendations:  Problem # 1:  DYSPNEA (ICD-786.09)  DDX of  difficult airways managment all start with A plus one B and and one C:    Adherence, Ace Inhibitors, Acid Reflux, Active Sinus Disease, Alpha 1 Antitripsin deficiency, Anxiety masquerading as Airways dz,  ABPA,  allergy(esp in young), Aspiration (esp in elderly), Adverse effects of DPI,  Active smokers, plus one B  = Beta blocker use.. or occult CHF  ? Acid reflux :  diet, max h2 first  ? Anxiety related to overuse chronically of benzo  ? Adverse drug effect, esp macrodantin > avoid in future  ? CHF related to BP - increase cozaar to 50 mg daily  See instructions for specific recommendations   Problem # 2:  HYPERTENSION (ICD-401.9) No evidence sign chf by bnp  The following medications were removed from the medication list:    Cozaar 25 Mg Tabs (Losartan potassium) ..... One by mouth once daily Her updated medication list for this problem includes:    Cozaar 50 Mg Tabs (Losartan  potassium) ..... One tablet by mouth daily  Problem # 3:  ABNORMAL LUNG XRAY (ICD-793.1) not sure what to make of abn ct 02/19/10 as not available on imagecast and would not follow with serial ct based on Radiation exposure unless symptoms warrant, esp with nl esr and bnp  Discussed in detail all the  indications, usual  risks and alternatives  relative to the benefits with patient who agrees to proceed with conservative f/u with cxr and pft's 6 weeks.   Medications Added to Medication List This Visit: 1)  Ranitidine Hcl 300 Mg Caps (Ranitidine hcl) .... One with bfast and one bedtime 2)  Cozaar 50 Mg Tabs (Losartan potassium) .... One tablet by mouth daily  Other Orders: TLB-BMP (Basic Metabolic Panel-BMET) (80048-METABOL) TLB-CBC Platelet - w/Differential (85025-CBCD) TLB-BNP (B-Natriuretic Peptide) (83880-BNPR) TLB-Sedimentation Rate (ESR) (85652-ESR) New Patient Level V ([address]) Pulse Oximetry, Ambulatory ([address])  Patient Instructions: 1)  No macrobid 2)  Please schedule a follow-up appointment in 6  weeks, sooner if needed for PFT's and CXR  3)  GERD (REFLUX)  is a common cause of respiratory symptoms. It commonly presents without heartburn and can be treated with medication, but also with lifestyle changes including avoidance of late meals, excessive alcohol, smoking cessation, and avoid fatty foods, chocolate, peppermint, colas, red wine, and acidic juices such as orange juice. NO MINT OR MENTHOL PRODUCTS SO NO COUGH DROPS  4)  USE SUGARLESS CANDY INSTEAD (jolley ranchers)  5)  NO OIL BASED VITAMINS  6)  Zantac 150 mg one with bfast and one at bedtime 7)  Increaese cozaar to 50 mg in am Prescriptions: COZAAR 50 MG  TABS (LOSARTAN POTASSIUM) One tablet by mouth daily  #34 x 11   Entered and Authorized by:   Nyoka Cowden MD   Signed by:   Nyoka Cowden MD on 03/04/2010   Method used:   Electronically to        [facility]* (retail)       [address]        [place], [place]  [address]       Ph: [phone]       Fax: [phone]   RxID:   [id number]

## 2010-09-02 NOTE — Procedures (Signed)
Summary: EGD   EGD  Procedure date:  12/05/2008  Findings:      Location: [facility]    ENDOSCOPY PROCEDURE REPORT  PATIENT:  Sandra Craig, Sandra Craig  MR#:  [id number] BIRTHDATE:   1941-05-08, 67 yrs. old   GENDER:   female  ENDOSCOPIST:   Vania Rea. Jarold Motto, MD, [facility] Referred by:   PROCEDURE DATE:  12/05/2008 PROCEDURE:  EGD with biopsy ASA CLASS:   Class II INDICATIONS: dyspepsia   MEDICATIONS:    Versed 2 mg IV, glycopyrrolate (Robinal) 0.2 IV TOPICAL ANESTHETIC:   Exactacain Spray  DESCRIPTION OF PROCEDURE:   After the risks benefits and alternatives of the procedure were thoroughly explained, informed consent was obtained.  The LB [id number]-[id number] endoscope was introduced through the mouth and advanced to the second portion of the duodenum, without limitations.  The instrument was slowly withdrawn as the mucosa was fully examined. <<PROCEDUREIMAGES>>      <<OLD IMAGES>>  Moderate gastritis was found in the body and the antrum of the stomach. CLO AND REGULAR BIOPSIES FOR H.PYLORI DONE.  Nodular mucosa was found in the bulb of the duodenum. MILD BRUNNER'S GLAND HYPERPLASUA NOTED.  other findings. SOME MOCOSAL NODULARITY IN BODY.    Retroflexed views revealed no abnormalities.    The scope was then withdrawn from the patient and the procedure completed.  COMPLICATIONS:   None  ENDOSCOPIC IMPRESSION:  1) Moderate gastritis in the body and the antrum of the stomach  2) Nodular mucosa in the bulb of duodenum  R/O H.PYLORI. RECOMMENDATIONS:  1) await biopsy results  2) continue current medications  REPEAT EXAM:   No   _______________________________ Vania Rea. Jarold Motto, MD, [facility]    CC: Thomos Lemons, DO        REPORT OF SURGICAL PATHOLOGY   Case #: [id number] Patient Name: [name], [name]. Office Chart Number:  [id number]   MRN: [id number] Pathologist: Beulah Gandy. Luisa Hart, MD DOB/Age  12-23-1940 (Age: 60)    Gender: F Date Taken:  12/05/2008 Date  Received: 12/08/2008   FINAL DIAGNOSIS   ***MICROSCOPIC EXAMINATION AND DIAGNOSIS***   STOMACH:  MILD CHRONIC GASTRITIS.  NO HELICOBACTER PYLORI, DYSPLASIA OR EVIDENCE OF MALIGNANCY IDENTIFIED.   COMMENT A Warthin-Starry stain is performed to determine the possibility of the presence of Helicobacter pylori. The Warthin-Starry stain is negative for organisms of Helicobacter pylori. The control(s) stained appropriately. (JDP:mj 12/09/08)   mj Date Reported:  12/09/2008     Beulah Gandy. Luisa Hart, MD *** Electronically Signed Out By JDP ***    Dec 09, 2008 MRN: [id number]    [facility] [address] [place], [place]  [address]    Dear Ms. Laural Benes,  I am pleased to inform you that the biopsies taken during your recent endoscopic examination did not show any evidence of cancer upon pathologic examination.  Additional information/recommendations:  __No further action is needed at this time.  Please follow-up with      your primary care physician for your other healthcare needs.  __ Please call [phone] to schedule a return visit to review      your condition.  xx__ Continue with the treatment plan as outlined on the day of your      exam.  __ You should have a repeat endoscopic examination for this problem              in _ months/years.   Please call us if you are having persistent problems or have questions about your condition  that have not been fully answered at this time.  Sincerely,  Mardella Layman MD [facility]  This letter has been electronically signed by your physician.   This report was created from the original endoscopy report, which was reviewed and signed by the above listed endoscopist.

## 2010-09-02 NOTE — Progress Notes (Signed)
Summary: Ambien  Medications Added AMBIEN 10 MG TABS (ZOLPIDEM TARTRATE) Take 1 tablet by mouth at bedtime AMLODIPINE BESYLATE 2.5 MG TABS (AMLODIPINE BESYLATE) Take 1 tablet by mouth once a day CALCARB 600 1500 MG TABS (CALCIUM CARBONATE) Take 1 tablet once a day CRESTOR 10 MG TABS (ROSUVASTATIN CALCIUM) Take 1 tablet by mouth at bedtime DIFLUCAN 150 MG TABS (FLUCONAZOLE) Take 1 tablet by mouth DOXYCYCLINE HYCLATE 100 MG CAPS (DOXYCYCLINE HYCLATE) Take 1 capsule by mouth twice a day LYRICA 100 MG CAPS (PREGABALIN) Take 1 capsule by mouth every night OMEPRAZOLE 20 MG CPDR (OMEPRAZOLE) Take 1 capsule by mouth once a day PREDNISONE 10 MG TABS (PREDNISONE) Take by mouth as directed VIVELLE-DOT 0.0375 MG/24HR PTTW (ESTRADIOL) Apply 1 patch twice a week       Phone Note Call from Patient   Summary of Call: Patient is requesting Generic Ambien called into [facility]. It was electronically [facility] but [facility] is unable to send that way. Ok to call in? Initial call taken by: Lamar Sprinkles,  June 12, 2007 1:14 PM  Follow-up for Phone Call        yes, OK to call in zolpidem Follow-up by: D Thomos Lemons DO,  June 12, 2007 1:48 PM  Additional Follow-up for Phone Call Additional follow up Details #1::        Rx Called In Additional Follow-up by: Lamar Sprinkles,  June 12, 2007 6:18 PM    New/Updated Medications: AMBIEN 10 MG TABS (ZOLPIDEM TARTRATE) Take 1 tablet by mouth at bedtime AMLODIPINE BESYLATE 2.5 MG TABS (AMLODIPINE BESYLATE) Take 1 tablet by mouth once a day CALCARB 600 1500 MG TABS (CALCIUM CARBONATE) Take 1 tablet once a day CRESTOR 10 MG TABS (ROSUVASTATIN CALCIUM) Take 1 tablet by mouth at bedtime DIFLUCAN 150 MG TABS (FLUCONAZOLE) Take 1 tablet by mouth DOXYCYCLINE HYCLATE 100 MG CAPS (DOXYCYCLINE HYCLATE) Take 1 capsule by mouth twice a day LYRICA 100 MG CAPS (PREGABALIN) Take 1 capsule by mouth every night OMEPRAZOLE 20 MG CPDR (OMEPRAZOLE) Take 1 capsule  by mouth once a day PREDNISONE 10 MG TABS (PREDNISONE) Take by mouth as directed VIVELLE-DOT 0.0375 MG/24HR PTTW (ESTRADIOL) Apply 1 patch twice a week

## 2010-09-02 NOTE — Progress Notes (Signed)
Summary: Results of scan  Phone Note Call from Patient Call back at Work Phone [phone]   Caller: Patient Summary of Call: Pt would like to know if results are avail of 7.13.11 scan, she will be at work # til 5:30 Initial call taken by: Lannette Donath,  February 11, 2010 11:51 AM  Follow-up for Phone Call        reviewed CT scan of chest with pt no change in 3mm right upper nodule we discussed hazy appearance of left upper lobe she notes chronic intermittent cough  I suggest pulm referral.  see orders Follow-up by: D. Thomos Lemons DO,  February 11, 2010 6:12 PM  Additional Follow-up for Phone Call Additional follow up Details #1::        Appt   Dr  Shelle Iron   August  4th   Additional Follow-up by: Darral Dash,  February 12, 2010 11:37 AM  New Problems: COUGH, CHRONIC (ICD-786.2)   New Problems: COUGH, CHRONIC (ICD-786.2)

## 2010-09-02 NOTE — Progress Notes (Signed)
Summary: Medication Refill  Phone Note Refill Request Call back at Home Phone [phone] Call back at [phone] Message from:  Patient on November 13, 2008 4:48 PM  Patient is requesting a refill on Zolpidem. She is requesting the Rx be sent to [facility] on [address]. Patient attempted refill, but states pharmacist informed her she is in need a  new rx   Method Requested: Telephone to Pharmacy Next Appointment Scheduled: No Future appointments on file Initial call taken by: Glendell Docker CMA,  November 13, 2008 4:50 PM  Follow-up for Phone Call        ok to refill x 5 Follow-up by: D. Thomos Lemons DO,  November 13, 2008 5:27 PM  Additional Follow-up for Phone Call Additional follow up Details #1::        Rx called to pharmacy Additional Follow-up by: Glendell Docker CMA,  November 14, 2008 8:58 AM    New/Updated Medications: AMBIEN 10 MG TABS (ZOLPIDEM TARTRATE) Take 1 tab by mouth at bedtime as needed   Prescriptions: AMBIEN 10 MG TABS (ZOLPIDEM TARTRATE) Take 1 tab by mouth at bedtime as needed  #30 x 5   Entered by:   Glendell Docker CMA   Authorized by:   D. Thomos Lemons DO   Signed by:   Glendell Docker CMA on 11/14/2008   Method used:   Telephoned to ...       [facility] [phone]* (retail)       [address] [place], [place]  [address]       Ph: [phone]       Fax: [phone]   RxID:   [id number]

## 2010-09-02 NOTE — Progress Notes (Signed)
Summary: REFILL REQUEST  Phone Note Refill Request Message from:  Fax from Pharmacy on October 13, 2009 8:27 AM  Refills Requested: Medication #1:  GABAPENTIN 100 MG CAPS one by mouth qhs   Dosage confirmed as above?Dosage Confirmed   Brand Name Necessary? No   Supply Requested: 1 month   Last Refilled: 09/15/2009 [facility] [address] [place] [phone] [phone]   Method Requested: Electronic Next Appointment Scheduled: NONE Initial call taken by: Roselle Locus,  October 13, 2009 8:29 AM  Follow-up for Phone Call        Refill sent to [facility]. Follow-up by: Mervin Kung CMA,  October 13, 2009 9:05 AM    Prescriptions: GABAPENTIN 100 MG CAPS (GABAPENTIN) one by mouth qhs  #30 x 0   Entered by:   Mervin Kung CMA   Authorized by:   D. Thomos Lemons DO   Signed by:   Mervin Kung CMA on 10/13/2009   Method used:   Electronically to        [facility]* (retail)       [address]       [place], [place]  [address]       Ph: [phone]       Fax: [phone]   RxID:   [id number]

## 2010-09-02 NOTE — Progress Notes (Signed)
Summary: results  Phone Note Call from Patient Call back at [phone]   Caller: Patient Call For: Fawn Desrocher Summary of Call: wants a copy of her last labs mailed to her Initial call taken by: Lacinda Axon,  March 26, 2010 11:23 AM  Follow-up for Phone Call        Called and confirmed pt's address. Labs sent in the mail to pt. Zackery Barefoot CMA  March 26, 2010 11:45 AM

## 2010-09-02 NOTE — Progress Notes (Signed)
Summary: Cozaar Refill  Phone Note Refill Request Message from:  Fax from Pharmacy on August 12, 2009 9:12 AM  Refills Requested: Medication #1:  COZAAR 25 MG TABS one by mouth once daily   Dosage confirmed as above?Dosage Confirmed   Brand Name Necessary? No   Supply Requested: 1 month   Last Refilled: 07/09/2009  Method Requested: Electronic Next Appointment Scheduled: No future appointment on file Initial call taken by: Glendell Docker CMA,  August 12, 2009 9:15 AM    Prescriptions: COZAAR 25 MG TABS (LOSARTAN POTASSIUM) one by mouth once daily  #30 x 3   Entered by:   Glendell Docker CMA   Authorized by:   D. Thomos Lemons DO   Signed by:   Glendell Docker CMA on 08/12/2009   Method used:   Electronically to        [facility]* (retail)       [address]       [place], [place]  [id number]       Ph: [phone]       Fax: [phone]   RxID:   [id number]

## 2010-09-02 NOTE — Letter (Signed)
Summary: [facility]  [facility]   Imported By: Maryln Gottron 05/19/2009 15:03:11  _____________________________________________________________________  External Attachment:    Type:   Image     Comment:   External Document

## 2010-09-02 NOTE — Procedures (Signed)
Summary: Gastroenterology - EGD  Gastroenterology - EGD   Imported By: Harlow Mares CMA 09/15/2008 12:45:35  _____________________________________________________________________  External Attachment:    Type:   Image     Comment:   External Document

## 2010-09-02 NOTE — Progress Notes (Signed)
Summary: Prolia status  Phone Note Outgoing Call Call back at [facility] Phone [phone]   Call placed by: Lucious Groves CMA,  February 24, 2010 11:05 AM Call placed to: Patient Summary of Call: Patient was previously notified that she was approved for Prolia inj. Due to patient not having the injection thus far, called to check on status. Left message on machine to call back to office.  Lucious Groves CMA,  February 24, 2010 11:04 AM  Follow-up for Phone Call        No return call from patient,  called again, machine beeps and was unable to leave message. Lucious Groves CMA  March 08, 2010 1:43 PM   Called patient, left message on machine to call back to office. Lucious Groves CMA  March 09, 2010 10:17 AM   Additional Follow-up for Phone Call Additional follow up Details #1::        left message on machine to call back to office. Lucious Groves CMA  March 12, 2010 4:45 PM   Still no return call from patient. Lucious Groves CMA  March 24, 2010 5:23 PM     Additional Follow-up for Phone Call Additional follow up Details #2::    Still no response from patient. Please advise. Lucious Groves CMA  March 19, 2010 2:44 PM   Additional Follow-up for Phone Call Additional follow up Details #3:: Details for Additional Follow-up Action Taken: noted Additional Follow-up by: Corwin Levins MD,  March 23, 2010 10:53 AM

## 2010-09-02 NOTE — Progress Notes (Signed)
Summary: Crestor Refill  Phone Note Refill Request Message from:  Fax from Pharmacy on June 12, 2009 8:24 AM  Refills Requested: Medication #1:  CRESTOR 20 MG TABS one by mouth once daily   Dosage confirmed as above?Dosage Confirmed   Brand Name Necessary? No   Supply Requested: 1 month   Last Refilled: 05/15/2009  Method Requested: Electronic Next Appointment Scheduled: 11-17-2:15 Dr Artist Pais  Initial call taken by: Roselle Locus,  June 12, 2009 8:24 AM    Prescriptions: CRESTOR 20 MG TABS (ROSUVASTATIN CALCIUM) one by mouth once daily  #30 x 5   Entered by:   Glendell Docker CMA   Authorized by:   D. Thomos Lemons DO   Signed by:   Glendell Docker CMA on 06/12/2009   Method used:   Electronically to        [facility]* (retail)       [address]       [place], [place]  [address]       Ph: [phone]       Fax: [phone]   RxID:   [id number]

## 2010-09-02 NOTE — Miscellaneous (Signed)
Summary: Mammogram  Clinical Lists Changes  Observations: Added new observation of MAMMOGRAM: normal (03/23/2010 10:02)      Preventive Care Screening  Mammogram:    Date:  03/23/2010    Results:  normal

## 2010-09-02 NOTE — Procedures (Signed)
Summary: Colonoscopy   Colonoscopy  Procedure date:  12/05/2008  Findings:      Location:  [facility].    Procedures Next Due Date:    Colonoscopy: 11/2018  COLONOSCOPY PROCEDURE REPORT  PATIENT:  Sandra Craig, Sandra Craig  MR#:  [id number] BIRTHDATE:   10-Oct-1940, 67 yrs. old   GENDER:   female  ENDOSCOPIST:   Vania Rea. Jarold Motto, MD, [facility] Referred by:   PROCEDURE DATE:  12/05/2008 PROCEDURE:  Colonoscopy, diagnostic ASA CLASS:   Class II INDICATIONS: abdominal pain, screening   MEDICATIONS:    Fentanyl 75 mcg IV, Versed 8 mg IV  DESCRIPTION OF PROCEDURE:   After the risks benefits and alternatives of the procedure were thoroughly explained, informed consent was obtained.  Digital rectal exam was performed and revealed no abnormalities.   The LB [id number] endoscope was introduced through the anus and advanced to the cecum, which was identified by both the appendix and ileocecal valve, without limitations.  The quality of the prep was excellent, using MoviPrep.  The instrument was then slowly withdrawn as the colon was fully examined. <<PROCEDUREIMAGES>>      <<OLD IMAGES>>  FINDINGS:  Severe diverticulosis was found sigmoid to descending  This was otherwise a normal examination of the colon. sigmoid area fixed from adhesions.   Retroflexed views in the rectum revealed no abnormalities.    The scope was then withdrawn from the patient and the procedure completed.  COMPLICATIONS:   None  ENDOSCOPIC IMPRESSION:  1) Severe diverticulosis in the sigmoid to descending  2) Otherwise normal examination RECOMMENDATIONS:  1) high fiber diet  2) You should continue follow current colorectal cancer screening guidelines for "routine risk" patients with a repeat colonoscopy in 10 years. I do not recommend other colon cancer screening prior to then (including stool tests for microscopic blood) unless new symptoms arise.      REPEAT EXAM:   No    _______________________________ Vania Rea. Jarold Motto, MD, [facility]  CC: Thomos Lemons, DO

## 2010-09-02 NOTE — Consult Note (Signed)
Summary: NP Consultation/[facility]  NP Consultation/[facility]   Imported By: Sherian Rein 10/17/2008 08:18:33  _____________________________________________________________________  External Attachment:    Type:   Image     Comment:   External Document

## 2010-09-02 NOTE — Assessment & Plan Note (Signed)
Summary: new endo/bcbs/#/thyroid/cd   Vital Signs:  Patient profile:   70 year old female Height:      66 inches (167.64 cm) Weight:      139.75 pounds (63.52 kg) BMI:     22.64 O2 Sat:      98 % on Room air Temp:     98.0 degrees F (36.67 degrees C) oral Pulse rate:   69 / minute Pulse (ortho):   142 / minute Pulse rhythm:   82 BP sitting:   142 / 82  (left arm) Cuff size:   regular  Vitals Entered By: Brenton Grills MA (February 12, 2010 3:39 PM)  O2 Flow:  Room air CC: New Endo Thyroid/pt is no longer using the Voltaren gel/aj   Primary Provider:  Dondra Spry DO  CC:  New Endo Thyroid/pt is no longer using the Voltaren gel/aj.  History of Present Illness: pt says she was dx'ed with fibromyalgia in the 1980's.  she reports many years of moderate diffuse pain, worst at the right hip, and associated fatigue.   she sees dr Thurston Hole for her hip pain.    Current Medications (verified): 1)  Crestor 20 Mg Tabs (Rosuvastatin Calcium) .... One By Mouth Once Daily 2)  Oscal 500/200 D-3 500-200 Mg-Unit  Tabs (Calcium-Vitamin D) .... By Mouth Once Daily 3)  Cozaar 25 Mg Tabs (Losartan Potassium) .... One By Mouth Once Daily 4)  Sertraline Hcl 100 Mg Tabs (Sertraline Hcl) .... One By Mouth Qd 5)  Multivitamins   Tabs (Multiple Vitamin) .Marland Kitchen.. 1 By Mouth Once Daily 6)  Zolpidem Tartrate 10 Mg Tabs (Zolpidem Tartrate) .... One By Mouth At Bedtime Prn 7)  Ranitidine Hcl 300 Mg Caps (Ranitidine Hcl) .Marland Kitchen.. 1po Once Daily 8)  Gabapentin 100 Mg Caps (Gabapentin) .... One To Two Tabs By Mouth At Bedtime As Directed 9)  Voltaren 1 % Gel (Diclofenac Sodium) .... Apply Three Times A Day  Allergies (verified): 1)  ! Sulfa 2)  ! Celebrex 3)  ! Hydrocodone  Past History:  Past Medical History: Last updated: 12/21/2009 IBS GERD Anemia  Depression     Hyperlipiidemia Osteopenia S/P fracture of right shoulder & hip  - Dr Thurston Hole, with post fx right shoulder adhesive capsultiis hx of mild left  shoulder adhesive capsulitis 2009 - no surgury Hypertension Cystitis Fibromyalgia Chronic Insomnia Pulmonary nodule - 5 mm anterior RUL - 12/08       Repeat CT of Chest 06/09 - no change    Palpitations Exertional dyspnea Fatigue Polycystic kidney - followed by Dr. 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 Mother had MI at age  43 Father - COPD Siblings with history of nephritis Family History of Colon Cancer: grandmother  sister has hypothyroidism mother had thyroidectomy (benign)     Social History: Reviewed history from 12/21/2009 and no changes required. Occupation:  works for [employer] Former Smoker quit 35 years ago (5 pack year history) Alcohol use-no   Illicit Drug Use - no  husband died - met prostate ca 13-Oct-2009 Widow/Widower  Review of Systems  The patient denies fever.         she reports insomnia, easy bruising, hoarseness, diffuse muscle weakness, nocturnal leg cramps, irritability, swelling of the periorbital areas, visual loss, dizziness, dry cough, dry fingernails, and anxiety.  she has lost a few  lbs.  denies n/v, but she has alternating constipation and diarrhea.    Physical Exam  General:  normal appearance.   Head:  head: no deformity eyes: no periorbital swelling, no proptosis external nose and ears are normal mouth: no lesion seen Neck:  Supple without thyroid enlargement or tenderness.  Lungs:  Clear to auscultation bilaterally. Normal respiratory effort.  Heart:  Regular rate and rhythm without murmurs or gallops noted. Normal S1,S2.   Msk:  muscle bulk is grossly normal.  gait is normal and steady  Extremities:  no edema no deformity there is mild cracking of the fingernails Neurologic:  cn 2-12 grossly intact.   readily moves all 4's.   sensation is intact to  touch on the feet  Skin:  normal texture and temp.  no rash.  not diaphoretic  Cervical Nodes:  No significant adenopathy.  Psych:  Alert and cooperative; normal mood and affect; normal attention span and concentration.   Additional Exam:  FastTSH                   2.14 uIU/mL    Impression & Recommendations:  Problem # 1:  FIBROMYALGIA (ICD-729.1) Assessment Unchanged  Problem # 2:  FATIGUE (ICD-780.79) Assessment: Unchanged  Problem # 3:  DEPRESSION (ICD-311) Assessment: Unchanged  Other Orders: TLB-TSH (Thyroid Stimulating Hormone) (84443-TSH) Est. Patient Level IV (04540)  Patient Instructions: 1)  blood tests are being ordered for you today.  please call [phone] to hear your test results. 2)  (update: i left message on phone-tree:  tsh is normal.  ret as needed.)

## 2010-09-02 NOTE — Progress Notes (Signed)
Summary: Rhinitis  Phone Note Call from Patient Call back at Home Phone [phone]   Caller: Patient Call For: D. Thomos Lemons DO Reason for Call: Refill Medication Summary of Call: patient called and left voice message stating she has nasal drip and would like to know if Dr Artist Pais would provide a rx to [facility]- location was not provided by patient. She states Dr Artist Pais has treated her in the past for rhinitis. Initial call taken by: Glendell Docker CMA,  April 12, 2010 4:58 PM  Follow-up for Phone Call        she can try otc fexofenadine 180 mg once daily. if she has use intranasal steroids before and they have been helpful, we can call in generic flonase 2 sprays each nostril once daily Follow-up by: D. Thomos Lemons DO,  April 13, 2010 9:19 AM  Additional Follow-up for Phone Call Additional follow up Details #1::        call returned to patient at [phone], no answer. A voice message was left for patient to return call Additional Follow-up by: Glendell Docker CMA,  April 13, 2010 10:04 AM    Additional Follow-up for Phone Call Additional follow up Details #2::    Advised pt per Dr. Olegario Messier instruction.  Pt states she had used Flonase in the past and it worked well. Would like rx sent to [facility] on [address]. Nicki Guadalajara Fergerson CMA Duncan Dull)  April 13, 2010 11:48 AM   New/Updated Medications: FLUTICASONE PROPIONATE 50 MCG/ACT SUSP (FLUTICASONE PROPIONATE) 2 sprays each nostril once daily Prescriptions: FLUTICASONE PROPIONATE 50 MCG/ACT SUSP (FLUTICASONE PROPIONATE) 2 sprays each nostril once daily  #1 x 2   Entered and Authorized by:   D. Thomos Lemons DO   Signed by:   D. Thomos Lemons DO on 04/13/2010   Method used:   Electronically to        [facility]* (retail)       [address]       [place], [place]  [address]       Ph: [phone]       Fax: [phone]   RxID:   [id number]   Appended Document: Rhinitis    Clinical Lists  Changes  Medications: Rx of FLUTICASONE PROPIONATE 50 MCG/ACT SUSP (FLUTICASONE PROPIONATE) 2 sprays each nostril once daily;  #1 x 2;  Signed;  Entered by: Mervin Kung CMA (AAMA);  Authorized by: D. Thomos Lemons DO;  Method used: Electronically to [facility]. [place] [phone]*, [address] [place], [place], [place]  [address], Ph: [phone] or [phone], Fax: [phone]    Prescriptions: FLUTICASONE PROPIONATE 50 MCG/ACT SUSP (FLUTICASONE PROPIONATE) 2 sprays each nostril once daily  #1 x 2   Entered by:   Mervin Kung CMA (AAMA)   Authorized by:   D. Thomos Lemons DO   Signed by:   Mervin Kung CMA (AAMA) on 04/13/2010   Method used:   Electronically to        [facility]. [address] [phone]* (retail)       [address]       [place], [place]  [address]       Ph: [phone] or [phone]       Fax: [phone]   RxID:   [id number]  Rx sent to [facility] was cancelled per Clydie Braun. Left message for pt to return my call. Nicki Guadalajara Fergerson CMA Duncan Dull)  April 13, 2010 1:42 PM

## 2010-09-02 NOTE — Assessment & Plan Note (Signed)
Summary: FATIGUE AND PAIN /NWS $50   Vital Signs:  Patient Profile:   70 Years Old Female Height:     66 inches Weight:      1354 pounds BMI:     219.33 Temp:     97.0 degrees F oral Pulse rate:   91 / minute BP sitting:   131 / 78  (right arm)  Vitals Entered By: Glendell Docker (Dec 05, 2007 9:13 AM)                 Chief Complaint:  Multiple medical problems or concerns.  History of Present Illness: 70 y/o white female for follow up.  She complains of intermittent fatigue.  She denies wt loss or anorexia.  She reports history of fibromyalgia.  She uses [place] daily but still has occ sleep disturbance.  We reviewed recent lab results.    Current Allergies (reviewed today): ! SULFA ! CELEBREX ! HYDROCODONE  Past Medical History:    GERD    Anemia    Depression    Hyperlipiidemia    Osteopenia    S/P fracture of right shoulder & hip    Hypertension    Cystitis    Fibromyalgia    Chronic Insomnia    Pulmonary nodule - 5 mm anterior RUL - 12/08   Social History:    Reviewed history from 08/09/2007 and no changes required:       Occupation:  works for [employer]       Married       Former Smoker quit 35 years ago (5 pack year history)       Alcohol use-no    Review of Systems      See HPI   Physical Exam  General:     alert, well-developed, and well-nourished.   Head:     normocephalic and atraumatic.   Neck:     supple and no masses.   Lungs:     normal respiratory effort and normal breath sounds.   Heart:     normal rate, regular rhythm, and no gallop.   Abdomen:     soft and non-tender.   Extremities:     No lower extremity edema  Neurologic:     alert & oriented X3 and cranial nerves II-XII intact.   Psych:     normally interactive, good eye contact, not anxious appearing, and not depressed appearing.      Impression & Recommendations:  Problem # 1:  FATIGUE (ICD-780.79) Pt with intermittent fatigue.  Feels washed out.   Reviewed labs.  Pt is not anemic and  thyroid function is normal.  Pt reports history of fibromyalgia.  She has been taking Ambien for years.  She still occasionally exp sleep disturbance.  I doubt her pulm nodule is playing a role.  Change zoloft to lexapro ( pt has samples).  Problem # 2:  HYPERLIPIDEMIA (ICD-272.2) Lipids are well controlled.  Maintain current medication regimen.  Her updated medication list for this problem includes:    Crestor 20 Mg Tabs (Rosuvastatin calcium) ..... One by mouth once daily  Labs Reviewed: Chol: 135 (12/04/2007)   HDL: 44.7 (12/04/2007)   LDL: 72 (12/04/2007)   TG: 93 (12/04/2007) SGOT: 16 (12/04/2007)   SGPT: 16 (12/04/2007)   Problem # 3:  GERD (ICD-530.81) Pt reports improvement with anti reflux measures.  She has tapered of Nexium.   I advised zantac 150 mg by mouth two times a day as needed.  The following medications were removed from the medication list:    Nexium 40 Mg Cpdr (Esomeprazole magnesium) .Marland Kitchen... Take 1 tab each morning  Her updated medication list for this problem includes:    Ranitidine Hcl 150 Mg Tabs (Ranitidine hcl) ..... One by mouth bid   Complete Medication List: 1)  Cozaar 25 Mg Tabs (Losartan potassium) .... Take 1 tablet by mouth every morning 2)  Crestor 20 Mg Tabs (Rosuvastatin calcium) .... One by mouth once daily 3)  Norvasc 2.5 Mg Tabs (Amlodipine besylate) .... Take 1 tablet by mouth once a day 4)  Oscal 500/200 D-3 500-200 Mg-unit Tabs (Calcium-vitamin d) .... By mouth once daily 5)  Vivelle-dot 0.025 Mg/24hr Pttw (Estradiol) .... One twice weekly 6)  Zoloft 50 Mg Tabs (Sertraline hcl) .... Take 1 tablet by mouth once a day 7)  Ambien Cr 12.5 Mg Tbcr (Zolpidem tartrate) .... One by mouth at bedtime prn 8)  Fosamax 70 Mg Tabs (Alendronate sodium) .... One tablet by mouth once a week 9)  Ranitidine Hcl 150 Mg Tabs (Ranitidine hcl) .... One by mouth bid   Patient Instructions: 1)  Please schedule a follow-up  appointment in 4 months.   ] Current Allergies (reviewed today): ! SULFA ! CELEBREX ! HYDROCODONE

## 2010-09-02 NOTE — Miscellaneous (Signed)
Summary: PV; Moviprep  Clinical Lists Changes  Medications: Added new medication of MOVIPREP 100 GM  SOLR (PEG-KCL-NACL-NASULF-NA ASC-C) As per prep instructions. - Signed Rx of MOVIPREP 100 GM  SOLR (PEG-KCL-NACL-NASULF-NA ASC-C) As per prep instructions.;  #1 x 0;  Signed;  Entered by: Kyra Searles RN II;  Authorized by: Mardella Layman MD [facility];  Method used: Electronically to [facility]*, [address] [place], [place]  [id number], Ph: [phone], Fax: [phone] Observations: Added new observation of ALLERGY REV: Done (11/21/2008 14:32)    Prescriptions: MOVIPREP 100 GM  SOLR (PEG-KCL-NACL-NASULF-NA ASC-C) As per prep instructions.  #1 x 0   Entered by:   Kyra Searles RN II   Authorized by:   Mardella Layman MD [facility]   Signed by:   Kyra Searles RN II on 11/21/2008   Method used:   Electronically to        [facility]* (retail)       [address]       [place], [place]  [address]       Ph: [phone]       Fax: [phone]   RxID:   [id number]

## 2010-09-02 NOTE — Assessment & Plan Note (Signed)
Summary: Pulmonary/ ext ov better p prednisone > try fluticasone bid    Copy to:  Dr. Thomos Lemons Primary Provider/Referring Provider:  D. Thomos Lemons DO  CC:  4 week follow up.cough improved. pt c/o chest congestion. Sandra Craig  History of Present Illness: 50 yowf quit smoking in 1977 with no resp problems chronically limited by right hip problems not sob.  March 04, 2010 cc paroxysms of sob not necessarily related to ex but happening nightly x 1 year indolent onset initially, not progressive, usually around 2 am minimal assoc dry cough and no diaphoreisis or  cp. No fluctuation.      has taken macrobid in past for uti's.    rec No macrobid  Zantac 150 mg one with bfast and one at bedtime Increaese cozaar to 50 mg in am  April 15, 2010 6 wk followup with PFT's.  Pt states that her cough is the same- no better or worse.  She states that "there is always something in throat". Her breathing is the same- no better or worse. constant sensation of globus, occ noct awakening with cough, remains mostly dry.  mild doe only. See Patient Care Coordinator before leaving for sinus ct Nexium 40 Take  one 30-60 min before first meal of the day  along with pepcid  20 mg at bedtime Prednisone 10 4 each am x 2days, 2x2days, 1x2days and stop for sensation of throat tickle or drainage use chlortrimeton 4 mg one  at bedtime and as needed during the day 6 hours   (did not really give this a fair try) Take delsym two tsp every 12 hours and add tramadol 50 mg up to 1-2 every 4 hours  May 20, 2010  cc .cough improved p rx with prednisone but then worse, better while on fluticasone also and now  pt c/o chest congestion assoc with mild nasal congestion.   Pt denies any significant sore throat, dysphagia, itching, sneezing, excess or purulent nasal secretions,  fever, chills, sweats, unintended wt loss, pleuritic or exertional cp, hempoptysis, change in activity tolerance  orthopnea pnd or leg swelling, Pt also denies  any obvious fluctuation in symptoms with weather or environmental change or other alleviating or aggravating factors.         Current Medications (verified): 1)  Crestor 20 Mg Tabs (Rosuvastatin Calcium) .... One By Mouth Once Daily 2)  Oscal 500/200 D-3 500-200 Mg-Unit  Tabs (Calcium-Vitamin D) .... By Mouth Once Daily 3)  Sertraline Hcl 100 Mg Tabs (Sertraline Hcl) .... One By Mouth Qd 4)  Multivitamins   Tabs (Multiple Vitamin) .Sandra Craig.. 1 By Mouth Once Daily 5)  Zolpidem Tartrate 10 Mg Tabs (Zolpidem Tartrate) .... One By Mouth At Bedtime Prn 6)  Gabapentin 100 Mg Caps (Gabapentin) .... One To Two Tabs By Mouth At Bedtime As Directed 7)  Cozaar 50 Mg  Tabs (Losartan Potassium) .... One Tablet By Mouth Daily 8)  Fluticasone Propionate 50 Mcg/act Susp (Fluticasone Propionate) .... 2 Sprays Each Nostril Once Daily As Needed 9)  Nexium 40 Mg  Cpdr (Esomeprazole Magnesium) .... By Mouth Daily. Take One Half Hour Before Eating. 10)  Tramadol Hcl 50 Mg  Tabs (Tramadol Hcl) .... One To Two By Mouth Every 4-6 Hours 11)  Chlor-Trimeton 4 Mg Tabs (Chlorpheniramine Maleate) .... One Every 6 Hours If Needed  Allergies (verified): 1)  ! Sulfa 2)  ! Celebrex 3)  ! Hydrocodone  Past History:  Past Medical History: IBS GERD Anemia  Depression  Hyperlipiidemia Osteopenia S/P fracture of right shoulder & hip  - Dr Thurston Hole, with post fx right shoulder adhesive capsultiis hx of mild left shoulder adhesive capsulitis 2009 - no surgury Hypertension Cystitis Fibromyalgia Chronic Insomnia Pulmonary nodule - 5 mm anterior RUL - 12/08       Repeat CT of Chest 06/09 - no change         Repeat CT RUL 3 mm Triad 02/19/10 Palpitations Exertional dyspnea     - No sign desats x 185 x3 March 04, 2010  Fatigue Polycystic kidney - followed by Dr. 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 Chronic cough  ...........................................................................Sandra KitchenWert    - PFT's nl x for truncation of fv loop April 15, 2010     - Sinus ct rec April 15, 2010  > nl     - Allergy profile sent May 20, 2010    - Trial of Nasal steroids as maint rx May 21, 2010 >>>  Vital Signs:  Patient profile:   70 year old female Height:      66 inches Weight:      137.13 pounds BMI:     22.21 O2 Sat:      96 % on Room air Temp:     97.9 degrees F oral Pulse rate:   75 / minute BP sitting:   112 / 76  (left arm) Cuff size:   regular  Vitals Entered By: Carver Fila (May 20, 2010 10:01 AM)  O2 Flow:  Room air CC: 4 week follow up.cough improved. pt c/o chest congestion.  Comments meds and allergies updated Phone number updated Carver Fila  May 20, 2010 10:02 AM    Physical Exam  Additional Exam:   wt 139 > 141 March 04, 2010 > 141 April 15, 2010 > 137 May 20, 2010  amb anxious hoarse wf nad HEENT mild turbinate edema.  Oropharynx no thrush or excess pnd or cobblestoning.  No JVD or cervical adenopathy. Mild accessory muscle hypertrophy. Trachea midline, nl thryroid. Chest was hyperinflated by percussion with diminished breath sounds and moderate increased exp time without wheeze. Hoover sign positive at mid inspiration. Regular rate and rhythm without murmur gallop or rub or increase P2 or edema.  Abd: no hsm, nl excursion. Ext warm without cyanosis or clubbing.     Impression & Recommendations:  Problem # 1:  COUGH, CHRONIC (ICD-786.2)    Classic Upper airway cough syndrome, so named because it's frequently impossible to sort out how much is  CR/sinusitis with freq throat clearing (which can be related to primary GERD)   vs  causing  secondary extra esophageal GERD from wide swings in gastric pressure that occur with throat clearing, promoting self use of mint and menthol lozenges that reduce the lower esophageal sphincter tone and exacerbate the  problem further . These are the same pts who not infrequently have failed to tolerate ace inhibitors,  dry powder inhalers or biphosphonates or report having reflux symptoms that don't respond to standard doses of PPI   Sinus Ct is neg so most likely mech is post nasal drip from chronic rhinitis, perhaps exac by GERD which is secondary to cough. rec trial of nasal steroids and add noct H1 and H2 per guidelines.  The standardized cough guidelines recently published in Chest are a 14 step process, not a single office visit,  and are intended  to address this problem  logically,  with an alogrithm dependent on response to each progressive step  to determine a specific diagnosis with  minimal addtional testing needed. Therefore if compliance is an issue this empiric standardized approach simply won't work.   Orders: Est. Patient Level IV ([address])  Medications Added to Medication List This Visit: 1)  Fluticasone Propionate 50 Mcg/act Susp (Fluticasone propionate) .... One to two puffs twice daily  Other Orders: TLB-CBC Platelet - w/Differential (85025-CBCD) T-Allergy Profile [place], [place], [place], , [place] [phone])  Patient Instructions: 1)  Nexium 40 Take  one 30-60 min before first meal of the day  along with pepcid  20 mg at bedtime 2)  for sensation of throat tickle or drainage use chlortrimeton 4 mg one  at bedtime and as needed during the day 6 hours   3)  Fluticasone  no immediate benefit in terms of improving symptoms.  To help them reached the target tissue, the patient should use Afrin two puffs every 12 hours applied one min before using the nasal steroids.  Afrin should be stopped after no more than 5 days.  If the symptoms worsen, Afrin can be restarted after 5 days off of therapy to prevent rebound congestion from overuse of Afrin.  I also emphasized that in no way are nasal steroids a concern in terms of "addiction". 4)   Please schedule a follow-up appointment in 6 weeks, sooner if needed    Clinical Reports Reviewed:  CXR:  04/15/2010: CXR Results:  COPD.  No active disease.    Immunization History:  Influenza Immunization History:    Influenza:  historical (05/01/2010)

## 2010-09-02 NOTE — Assessment & Plan Note (Signed)
Summary: BREATHING PROBLEMS/NML   Vital Signs:  Patient Profile:   70 Years Old Female Height:     66 inches Weight:      136.25 pounds BMI:     22.07 Temp:     97.6 degrees F oral Pulse rate:   76 / minute BP sitting:   127 / 80  (right arm)  Vitals Entered By: Glendell Docker (September 13, 2007 11:19 AM)                 Chief Complaint:  C/O INCREASE IN ACID REFLUX.  History of Present Illness: 70 year old tried omeprazole in lieu of Nexium.  She complains of worsening reflux with nocturnal symptoms.    Current Allergies (reviewed today): ! SULFA ! CELEBREX ! HYDROCODONE  Past Medical History:    GERD    Anemia    Depression    Hyperlipiidemia    Osteopenia    S/P fracture of right shoulder & hip    Hypertension    Cystitis    Fibromyalgia    Chronic Insomnia    Pulmonary nodule   Social History:    Reviewed history from 08/09/2007 and no changes required:       Occupation:  works for [employer]       Married       Former Smoker quit 35 years ago (5 pack year history)       Alcohol use-no     Physical Exam  General:     alert, well-developed, and well-nourished.   Lungs:     normal respiratory effort and normal breath sounds.   Heart:     normal rate, regular rhythm, and no murmur.   Abdomen:     soft and non-tender.   Extremities:     No lower extremity edema     Impression & Recommendations:  Problem # 1:  GASTROESOPHAGEAL REFLUX DISEASE, SEVERE (ICD-530.81) Trial of Zegerid.  Antireflux measures discussed.  Her updated medication list for this problem includes:    Zegerid 40-1100 Mg Caps (Omeprazole-sodium bicarbonate) ..... One by mouth qhs  The following medications were removed from the medication list:    Nexium 40 Mg Cpdr (Esomeprazole magnesium) ..... By mouth once daily    Eql Omeprazole 20 Mg Tbec (Omeprazole) .Marland Kitchen... 2 by mouth once daily ac  Her updated medication list for this problem includes:    Zegerid  40-1100 Mg Caps (Omeprazole-sodium bicarbonate) ..... One by mouth qhs   Complete Medication List: 1)  Cozaar 25 Mg Tabs (Losartan potassium) .... By mouth once daily 2)  Crestor 20 Mg Tabs (Rosuvastatin calcium) .... One by mouth once daily 3)  Norvasc 2.5 Mg Tabs (Amlodipine besylate) .... By mouth once daily 4)  Oscal 500/200 D-3 500-200 Mg-unit Tabs (Calcium-vitamin d) .... By mouth once daily 5)  Vivelle-dot 0.025 Mg/24hr Pttw (Estradiol) .... One twice weekly 6)  Zoloft 50 Mg Tabs (Sertraline hcl) .... By mouth once daily 7)  Ambien Cr 12.5 Mg Tbcr (Zolpidem tartrate) .... One by mouth at bedtime prn 8)  Fosamax 70 Mg Tabs (Alendronate sodium) .... One tablet by mouth once a week 9)  Zegerid 40-1100 Mg Caps (Omeprazole-sodium bicarbonate) .... One by mouth qhs   Patient Instructions: 1)  Please schedule a follow-up appointment in 4 months.     Prescriptions: ZEGERID 40-1100 MG  CAPS (OMEPRAZOLE-SODIUM BICARBONATE) one by mouth qhs  #30 x 5   Entered and Authorized by:   D. Thomos Lemons DO  Signed by:   D. Thomos Lemons DO on 09/13/2007   Method used:   Electronically sent to ...       [facility]*       [address]       [place], [place]  [address]       Ph: [phone] or [phone]       Fax: [phone]   RxID:   [id number]  ] Current Allergies (reviewed today): ! SULFA ! CELEBREX ! HYDROCODONE

## 2010-09-02 NOTE — Assessment & Plan Note (Signed)
Summary: PER STACEY PROLIA-- ZERO OUT OF POCKET-- PER LA DOUBLEBOOK-AV...   Nurse Visit   Allergies: 1)  ! Sulfa 2)  ! Celebrex 3)  ! Hydrocodone  Medication Administration  Injection # 1:    Medication: Prolia 60mg     Diagnosis: OSTEOPOROSIS (ICD-733.00)    Route: SQ    Site: L deltoid    Exp Date: 07/02/2011    Lot #: [id number]    Mfr: [employer]    Patient tolerated injection without complications    Given by: patty berrocal, sma  Orders Added: 1)  Prolia 60mg  [J3590] 2)  Admin of Therapeutic Inj  intramuscular or subcutaneous [08657]

## 2010-09-02 NOTE — Medication Information (Signed)
Summary: Paperwork/ProliaPlus  Paperwork/ProliaPlus   Imported By: Lester Carbon 01/05/2010 09:47:05  _____________________________________________________________________  External Attachment:    Type:   Image     Comment:   External Document

## 2010-09-02 NOTE — Progress Notes (Signed)
  Phone Note Outgoing Call   Summary of Call: call pt - u/a with slight infection.  I will call in Cipro.  Take for 5 days Initial call taken by: D. Thomos Lemons DO,  January 16, 2008 11:58 AM  Follow-up for Phone Call        Patient informed per Dr Artist Pais instructions Follow-up by: Glendell Docker,  January 16, 2008 1:19 PM    New/Updated Medications: CIPRO 250 MG  TABS (CIPROFLOXACIN HCL) one by mouth two times a day   Prescriptions: CIPRO 250 MG  TABS (CIPROFLOXACIN HCL) one by mouth two times a day  #10 x 0   Entered and Authorized by:   D. Thomos Lemons DO   Signed by:   D. Thomos Lemons DO on 01/16/2008   Method used:   Electronically sent to ...       [facility]*       [address]       [place], [place]  [id number]       Ph: [phone]       Fax: [phone]   RxID:   [id number]

## 2010-09-02 NOTE — Miscellaneous (Signed)
Summary: Orders Update pft charges  Clinical Lists Changes  Orders: Added new Service order of Carbon Monoxide diffusing w/capacity (94720) - Signed Added new Service order of Lung Volumes (94240) - Signed Added new Service order of Spirometry (Pre & Post) (94060) - Signed 

## 2010-09-02 NOTE — Progress Notes (Signed)
Summary: Zegerid prior auth  Phone Note From Pharmacy   Caller: [facility]* Call For: Prior Network engineer of Call: Per pharmacy, insurance will not cover Zegerid without prior auth> Preferred alt are omeprazole,pantoprazole, and nexium. Please advise  Initial call taken by: Rock Nephew CMA,  September 14, 2007 3:16 PM  Follow-up for Phone Call        call pt - she should go back to nexium first. Follow-up by: D. Thomos Lemons DO,  September 14, 2007 3:21 PM  Additional Follow-up for Phone Call Additional follow up Details #1::        Pt informed  Additional Follow-up by: Lamar Sprinkles,  September 14, 2007 5:05 PM    New/Updated Medications: NEXIUM 40 MG CPDR (ESOMEPRAZOLE MAGNESIUM) Take 1 tab each morning   Prescriptions: NEXIUM 40 MG CPDR (ESOMEPRAZOLE MAGNESIUM) Take 1 tab each morning  #30 x 6   Entered by:   Lamar Sprinkles   Authorized by:   D. Thomos Lemons DO   Signed by:   Lamar Sprinkles on 09/14/2007   Method used:   Electronically sent to ...       [facility]*       [address]       [place], [place]  [address]       Ph: [phone] or [phone]       Fax: [phone]   RxID:   [id number]

## 2010-09-02 NOTE — Assessment & Plan Note (Signed)
Summary: F/U 70MO/$50/JK   Vital Signs:  Patient Profile:   70 Years Old Female Height:     66 inches Weight:      134.75 pounds BMI:     21.83 Temp:     97.8 degrees F oral Pulse rate:   83 / minute Resp:     18 per minute BP sitting:   130 / 80  (left arm) Cuff size:   regular  Vitals Entered By: Glendell Docker CMA (March 27, 2008 9:15 AM)                 Chief Complaint:  Multiple medical problems or concerns and URI symptoms.  History of Present Illness: URI Symptoms      This is a 70 year old woman who presents with URI symptoms.  The patient reports nasal congestion and clear nasal discharge.  The patient denies fever.  The patient also reports sneezing and seasonal symptoms.  The patient denies the following risk factors for Strep sinusitis: unilateral facial pain and unilateral nasal discharge.    Since stopping Zoloft pt has noticed progressive increase in irritability and anxiety.  Patient notes persistent improvement since starting Savella.  Chronic fatigue and muscle aches have resolved.  Hypertension Follow-Up      The patient also presents for Hypertension follow-up.  The patient reports lightheadedness and fatigue.  The patient denies the following associated symptoms: chest pain.  Pt has been cutting cozaar in half.  Pt notes some increase in fatigue with Bystolic but she is getting used to medication. Her palpitations have resolved.    Current Allergies (reviewed today): ! SULFA ! CELEBREX ! HYDROCODONE  Past Medical History:    GERD    Anemia    Depression    Hyperlipiidemia    Osteopenia    S/P fracture of right shoulder & hip    Hypertension    Cystitis    Fibromyalgia    Chronic Insomnia    Pulmonary nodule - 5 mm anterior RUL - 12/08          Repeat CT of Chest 06/09 - no change    Past Surgical History:    EGD    Total Abdominal Hysterectomy W/ BSO    Cholecystectomy    Social History:    Occupation:  works for [employer]  Married    Former Smoker quit 35 years ago (5 pack year history)    Alcohol use-no           Review of Systems      See HPI   Physical Exam  General:     alert, well-developed, and well-nourished.   Head:     normocephalic and atraumatic.   Ears:     R ear normal and L ear normal.   Mouth:     pharynx pink and moist and postnasal drip.   Neck:     supple and no masses.   Lungs:     normal respiratory effort and normal breath sounds.   Heart:     normal rate, regular rhythm, and no gallop.   Abdomen:     soft and non-tender.   Extremities:     No lower extremity edema  Neurologic:     alert & oriented X3 and cranial nerves II-XII intact.      Impression & Recommendations:  Problem # 1:  DEPRESSION (ICD-311) Pt has noticed increase in irritability and anxiety since stopping zoloft.  Restart sertraline.  Her updated medication list for this problem includes:    Sertraline Hcl 50 Mg Tabs (Sertraline hcl) .Marland Kitchen... 1/2 by mouth once daily x 1 wk, then one by mouth qd   Problem # 2:  CHRONIC RHINITIS (ICD-472.0) Pt likely has allergic rhinitis.  Trial of flonase.   Problem # 3:  PALPITATIONS (ICD-785.1) Assessment: Improved Improved with B blocker.  Maintain current medication regimen.  Her updated medication list for this problem includes:    Bystolic 5 Mg Tabs (Nebivolol hcl) ..... One by mouth once daily   Problem # 4:  URINARY FREQUENCY (ICD-788.41) Pt never used previous macrobid for UTI.  I advised pt finish abx. The following medications were removed from the medication list:    Macrobid 100 Mg Caps (Nitrofurantoin monohyd macro) ..... One by mouth bid  Orders: UA Dipstick w/o Micro (manual) (56213)   Problem # 5:  ESSENTIAL HYPERTENSION (ICD-401.9) Assessment: Improved BP improved.  Maintain current medication regimen.    Her updated medication list for this problem includes:    Cozaar 25 Mg Tabs (Losartan potassium) ..... One by mouth once  daily    Bystolic 5 Mg Tabs (Nebivolol hcl) ..... One by mouth once daily  BP today: 130/80 Prior BP: 144/100 (02/25/2008)  Labs Reviewed: Creat: 1.1 (12/04/2007) Chol: 135 (12/04/2007)   HDL: 44.7 (12/04/2007)   LDL: 72 (12/04/2007)   TG: 93 (12/04/2007)   Complete Medication List: 1)  Crestor 20 Mg Tabs (Rosuvastatin calcium) .... One by mouth once daily 2)  Oscal 500/200 D-3 500-200 Mg-unit Tabs (Calcium-vitamin d) .... By mouth once daily 3)  Vivelle-dot 0.025 Mg/24hr Pttw (Estradiol) .... One twice weekly 4)  Fosamax 70 Mg Tabs (Alendronate sodium) .... One tablet by mouth once a week 5)  Savella 50 Mg Tabs (Milnacipran hcl) .... One by mouth bid 6)  Cozaar 25 Mg Tabs (Losartan potassium) .... One by mouth once daily 7)  Bystolic 5 Mg Tabs (Nebivolol hcl) .... One by mouth once daily 8)  Sertraline Hcl 50 Mg Tabs (Sertraline hcl) .... 1/2 by mouth once daily x 1 wk, then one by mouth qd 9)  Fluticasone Propionate 50 Mcg/act Susp (Fluticasone propionate) .... 2 sprays each nostril once daily 10)  Zolpidem Tartrate 10 Mg Tabs (Zolpidem tartrate) .... One by mouth at bedtime prn   Patient Instructions: 1)  Please schedule a follow-up appointment in 2 months.   Prescriptions: ZOLPIDEM TARTRATE 10 MG TABS (ZOLPIDEM TARTRATE) one by mouth at bedtime prn  #90 x 3   Entered and Authorized by:   D. Thomos Lemons DO   Signed by:   D. Thomos Lemons DO on 03/27/2008   Method used:   Print then Give to Patient   RxID:   [id number] SERTRALINE HCL 50 MG TABS (SERTRALINE HCL) 1/2 by mouth once daily x 1 wk, then one by mouth qd  #90 x 3   Entered and Authorized by:   D. Thomos Lemons DO   Signed by:   D. Thomos Lemons DO on 03/27/2008   Method used:   Electronically to        [facility]* (mail-order)             ,          Ph: [phone]       Fax: [phone]   RxID:   [id number] BYSTOLIC 5 MG  TABS (NEBIVOLOL HCL) one by mouth once daily  #90 x 3   Entered and Authorized by:  Dondra Spry DO   Signed by:   D. Thomos Lemons DO on 03/27/2008   Method used:   Electronically to        [facility]* (mail-order)             ,          Ph: [phone]       Fax: [phone]   RxID:   [id number] COZAAR 25 MG TABS (LOSARTAN POTASSIUM) one by mouth once daily  #90 x 3   Entered and Authorized by:   D. Thomos Lemons DO   Signed by:   D. Thomos Lemons DO on 03/27/2008   Method used:   Electronically to        [facility]* (mail-order)             ,          Ph: [phone]       Fax: [phone]   RxID:   [id number] SAVELLA 50 MG  TABS (MILNACIPRAN HCL) one by mouth bid  #180 x 3   Entered and Authorized by:   D. Thomos Lemons DO   Signed by:   D. Thomos Lemons DO on 03/27/2008   Method used:   Electronically to        [facility]* (mail-order)             ,          Ph: [phone]       Fax: [phone]   RxID:   [id number] FOSAMAX 70 MG  TABS (ALENDRONATE SODIUM) one tablet by mouth once a week  #12 x 3   Entered and Authorized by:   D. Thomos Lemons DO   Signed by:   D. Thomos Lemons DO on 03/27/2008   Method used:   Electronically to        [facility]* (mail-order)             ,          Ph: [phone]       Fax: [phone]   RxID:   [id number] CRESTOR 20 MG TABS (ROSUVASTATIN CALCIUM) one by mouth once daily  #90 x 3   Entered and Authorized by:   D. Thomos Lemons DO   Signed by:   D. Thomos Lemons DO on 03/27/2008   Method used:   Electronically to        [facility]* (mail-order)             ,          Ph: [phone]       Fax: [phone]   RxID:   [id number] BYSTOLIC 5 MG  TABS (NEBIVOLOL HCL) one by mouth once daily  #30 x 5   Entered and Authorized by:   D. Thomos Lemons DO   Signed by:   D. Thomos Lemons DO on 03/27/2008   Method used:   Electronically to        [facility]* (retail)       [address]       [place], [place]  [id number]       Ph: [phone]       Fax: [phone]   RxID:    [id number] FLUTICASONE PROPIONATE 50 MCG/ACT SUSP (FLUTICASONE PROPIONATE) 2 sprays each nostril once daily  #1 bottle x 2   Entered and Authorized by:   D. Thomos Lemons DO   Signed by:   D. Molly Maduro  Artist Pais DO on 03/27/2008   Method used:   Electronically to        [facility]* (retail)       [address]       [place], [place]  [id number]       Ph: [phone]       Fax: [phone]   RxID:   [id number] COZAAR 25 MG TABS (LOSARTAN POTASSIUM) one by mouth once daily  #30 x 5   Entered and Authorized by:   D. Thomos Lemons DO   Signed by:   D. Thomos Lemons DO on 03/27/2008   Method used:   Electronically to        [facility]* (retail)       [address]       [place], [place]  [address]       Ph: [phone]       Fax: [phone]   RxID:   [id number] SERTRALINE HCL 50 MG TABS (SERTRALINE HCL) 1/2 by mouth once daily x 1 wk, then one by mouth qd  #30 x 5   Entered and Authorized by:   D. Thomos Lemons DO   Signed by:   D. Thomos Lemons DO on 03/27/2008   Method used:   Electronically to        [facility]* (retail)       [address]       [place], [place]  [address]       Ph: [phone]       Fax: [phone]   RxID:   [id number]  ] Current Allergies (reviewed today): ! SULFA ! CELEBREX ! HYDROCODONE   Laboratory Results   Urine Tests    Routine Urinalysis   Color: lt. yellow Appearance: Clear Glucose: negative   (Normal Range: Negative) Bilirubin: negative   (Normal Range: Negative) Ketone: negative   (Normal Range: Negative) Spec. Gravity: 1.015   (Normal Range: 1.003-1.035) Blood: trace-intact   (Normal Range: Negative) pH: 6.0   (Normal Range: 5.0-8.0) Protein: trace   (Normal Range: Negative) Urobilinogen: 0.2   (Normal Range: 0-1) Nitrite: negative   (Normal Range: Negative) Leukocyte Esterace: trace   (Normal Range: Negative)

## 2010-09-02 NOTE — Assessment & Plan Note (Signed)
Summary: new pt /bcbs/#/lb   Vital Signs:  Patient profile:   70 year old female Height:      66 inches Weight:      137.50 pounds BMI:     22.27 O2 Sat:      94 % on Room air Temp:     98.2 degrees F oral Pulse rate:   78 / minute BP sitting:   102 / 66  (left arm) Cuff size:   regular  Vitals Entered ByZella Ball Ewing (Dec 21, 2009 9:40 AM)  O2 Flow:  Room air  CC: New Pt. BCBS/RE   Primary Care Provider:  Dondra Spry DO  CC:  New Pt. BCBS/RE.  History of Present Illness: here as new pt, no complaints except for mild breakthrough reflux symptoms without dysphagia, n/v, abd pain, bowel change or blood.  Pt denies CP, sob, doe, wheezing, orthopnea, pnd, worsening LE edema, palps, dizziness or syncope   Pt denies new neuro symptoms such as headache, facial or extremity weakness   Hx signfiicant for severe dual bone break (right arm and hip) at low level (walking with fall)- no prior PTH level check per pt, and recent dxa with osteopenia.    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) 18)  Acquired Cyst of Kidney  (ICD-593.2) 19)  Encounter For Long-term Use of Other Medications  (ICD-V58.69) 20)  Abdominal Pain  (ICD-789.00) 21)  Pelvic Pain  (ICD-789.09) 22)  Hematuria Unspecified  (ICD-599.70) 23)  Irritable Bowel Syndrome  (ICD-564.1) 24)  Acute Cystitis  (ICD-595.0) 25)  Chronic Rhinitis   (ICD-472.0) 26)  Palpitations  (ICD-785.1) 27)  Depression  (ICD-311) 28)  Urinary Frequency  (ICD-788.41) 29)  Fibromyalgia  (ICD-729.1) 30)  Fatigue  (ICD-780.79) 31)  Gerd  (ICD-530.81) 32)  Pulmonary Nodule, Right Upper Lobe  (ICD-518.89) 33)  Insomnia-sleep Disorder-unspec  (ICD-307.40) 34)  Osteoporosis  (ICD-733.00) 35)  Essential Hypertension  (ICD-401.9) 36)  Hyperlipidemia  (ICD-272.2)  Medications Prior to Update: 1)  Crestor 20 Mg Tabs (Rosuvastatin Calcium) .... One By Mouth Once Daily 2)  Oscal 500/200 D-3 500-200 Mg-Unit  Tabs (Calcium-Vitamin D) .... By Mouth Once Daily 3)  Cozaar 25 Mg Tabs (Losartan Potassium) .... One By Mouth Once Daily 4)  Sertraline Hcl 100 Mg Tabs (Sertraline Hcl) .... One By Mouth Qd 5)  Multivitamins   Tabs (Multiple Vitamin) .Marland Kitchen.. 1 By Mouth Once Daily 6)  Zolpidem Tartrate 10 Mg Tabs (Zolpidem Tartrate) .... One By Mouth At Bedtime Prn 7)  Zantac 150 Mg Tabs (Ranitidine Hcl) .... Take 1 Tablet By Mouth Once A Day 8)  Gabapentin 100 Mg Caps (Gabapentin) .... One To Two Tabs By Mouth At Bedtime As Directed 9)  Voltaren 1 % Gel (Diclofenac Sodium) .... Apply Three Times A Day 10)  Cefuroxime Axetil 500 Mg Tabs (Cefuroxime Axetil) .... One By Mouth Two Times A Day  Current Medications (verified): 1)  Crestor 20 Mg Tabs (Rosuvastatin Calcium) .... One By Mouth Once Daily 2)  Oscal 500/200 D-3 500-200 Mg-Unit  Tabs (Calcium-Vitamin D) .... By Mouth Once Daily 3)  Cozaar 25 Mg Tabs (Losartan Potassium) .... One By Mouth Once Daily 4)  Sertraline Hcl 100 Mg Tabs (Sertraline Hcl) .... One By Mouth Qd 5)  Multivitamins   Tabs (Multiple Vitamin) .Marland Kitchen.. 1 By Mouth Once Daily 6)  Zolpidem Tartrate 10 Mg Tabs (Zolpidem Tartrate) .... One By Mouth At Bedtime Prn 7)  Ranitidine Hcl 300 Mg Caps (Ranitidine Hcl) .Marland Kitchen.. 1po Once Daily 8)  Gabapentin 100 Mg Caps (Gabapentin) .... One To Two Tabs By Mouth At Bedtime As Directed 9)  Voltaren 1 % Gel (Diclofenac  Sodium) .... Apply Three Times A Day  Allergies (verified): 1)  ! Sulfa 2)  ! Celebrex 3)  ! Hydrocodone  Past History:  Family History: Last updated: 11/16/2009 Mother had MI at age  25 Father - COPD Siblings with history of nephritis Family History of Colon Cancer: grandmother      Social History: Last updated: 12/21/2009 Occupation:  works for [employer] Former Smoker quit 35 years ago (5 pack year history) Alcohol use-no   Illicit Drug Use - no  husband died - met prostate ca 11/04/2009 Widow/Widower  Risk Factors: Alcohol Use: 0 (03/10/2009) Caffeine Use: 1 cup coffe daily (03/10/2009) Exercise: no (03/10/2009)  Risk Factors: Smoking Status: quit (11/16/2009)  Past Medical History: IBS GERD Anemia  Depression     Hyperlipiidemia Osteopenia S/P fracture of right shoulder & hip  - Dr Thurston Hole, with post fx right shoulder adhesive capsultiis hx of mild left shoulder adhesive capsulitis 2009 - no surgury Hypertension Cystitis Fibromyalgia Chronic Insomnia Pulmonary nodule - 5 mm anterior RUL - 12/08       Repeat CT of Chest 06/09 - no change    Palpitations Exertional dyspnea Fatigue Polycystic kidney - followed by Dr. 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 Mother had MI at age  77 Father - COPD Siblings with history of nephritis Family History of Colon Cancer: grandmother      Social History: Reviewed history from 08/10/2009 and no changes required. Occupation:  works for [employer] Former Smoker quit 35 years ago (5 pack year history) Alcohol use-no   Illicit Drug Use - no  husband died - met prostate ca 11-04-2009 Widow/Widower  Review of Systems       all otherwise negative per pt -    Physical Exam  General:  alert and well-developed.   Head:  normocephalic and atraumatic.   Eyes:  vision grossly intact, pupils equal, and pupils round.   Ears:  R ear normal and L ear normal.   Nose:  no external deformity and no nasal discharge.   Mouth:  no gingival abnormalities and pharynx pink and moist.   Neck:  supple and no masses.   Lungs:  normal respiratory effort and normal breath sounds.   Heart:  normal rate and regular rhythm.   Extremities:  no edema, no erythema    Impression & Recommendations:  Problem # 1:  HYPERTENSION (ICD-401.9)  Her updated medication list for this problem includes:    Cozaar 25 Mg Tabs (Losartan potassium) ..... One by mouth once daily  BP today: 102/66 Prior BP: 112/80 (11/16/2009)  Labs Reviewed: K+: 5.2 (11/16/2009) Creat: : 1.24 (11/16/2009)   Chol: 145 (03/10/2009)   HDL: 47.60 (03/10/2009)   LDL: 76 (03/10/2009)   TG: 107.0 (03/10/2009) stable overall by hx and exam, ok to continue meds/tx as is   Problem # 2:  OSTEOPOROSIS (ICD-733.00) actually has osteopenia but with hx of low level fall and mutiple bone fx; to check on prolia copay - if ok , should tx with prolia; also to check PTH, ca level  Problem # 3:  ANEMIA (ICD-285.9) recent cbc normal, ok to follow  Problem # 4:  GERD (ICD-530.81)  Her updated medication list for this problem includes:    Ranitidine Hcl 300 Mg Caps (Ranitidine hcl) .Marland Kitchen... 1po once daily treat as above, f/u any worsening signs or symptoms   Complete Medication List: 1)  Crestor 20 Mg Tabs (Rosuvastatin calcium) .... One by mouth once daily 2)  Oscal 500/200 D-3 500-200 Mg-unit Tabs (Calcium-vitamin d) .... By mouth once daily 3)  Cozaar 25 Mg Tabs (Losartan potassium) ....  One by mouth once daily 4)  Sertraline Hcl 100 Mg Tabs (Sertraline hcl) .... One by mouth qd 5)  Multivitamins Tabs (Multiple vitamin) .Marland Kitchen.. 1 by mouth once daily 6)  Zolpidem Tartrate 10 Mg Tabs (Zolpidem tartrate) .... One by mouth at bedtime prn 7)  Ranitidine Hcl 300 Mg Caps (Ranitidine hcl) .Marland Kitchen.. 1po once daily 8)  Gabapentin 100 Mg Caps (Gabapentin) .... One to two tabs by mouth at bedtime as directed 9)  Voltaren 1 % Gel (Diclofenac sodium) .... Apply three times a day  Other Orders: T-Parathyroid Hormone, Intact w/ Calcium ([address]) TD Toxoids IM 7 YR + (09811) Admin 1st Vaccine (91478)  Patient Instructions: 1)  you had the tetanus shot today 2)  Please go to the Lab in the basement for your blood  tests today 3)  call the number on the blue card for the PTH level result 4)  increase the zantac to 300 mg due to the hoarseness 5)  you should be called soon about the prolia copay cost 6)  Continue all previous medications as before this visit  7)  Please schedule a follow-up appointment in 6 months with CPX labs Prescriptions: RANITIDINE HCL 300 MG CAPS (RANITIDINE HCL) 1po once daily  #90 x 3   Entered and Authorized by:   Corwin Levins MD   Signed by:   Corwin Levins MD on 12/21/2009   Method used:   Print then Give to Patient   RxID:   [id number] ZANTAC 150 MG TABS (RANITIDINE HCL) Take 1 tablet by mouth once a day  #90 x 3   Entered and Authorized by:   Corwin Levins MD   Signed by:   Corwin Levins MD on 12/21/2009   Method used:   Print then Give to Patient   RxID:   [id number]    Immunizations Administered:  Tetanus Vaccine:    Vaccine Type: Td    Site: left deltoid    Mfr: Sanofi Pasteur    Dose: 0.5 ml    Route: IM    Given by: Zella Ball Ewing    Exp. Date: 01/13    Lot #: [id number]  VIS given: 06/19/07 version given Dec 21, 2009.

## 2010-09-02 NOTE — Assessment & Plan Note (Signed)
Summary: FU / SEEING DR Sherene Sires AT 3:45 /NWS   Vital Signs:  Patient profile:   70 year old female Height:      66 inches Weight:      130.50 pounds BMI:     21.14 O2 Sat:      98 % on Room air Temp:     97.7 degrees F oral Pulse rate:   80 / minute BP sitting:   132 / 80  (left arm) Cuff size:   regular  Vitals Entered By: Zella Ball Ewing CMA Duncan Dull) (July 07, 2010 1:55 PM)  O2 Flow:  Room air  CC: Followup/RE   Primary Care Provider:  Dondra Spry DO  CC:  Followup/RE.  History of Present Illness: here for wellness and f/iu - BP has been elevated in the AM with DBP in  90 - 105 so she takes the cozaar at night and seems to help; Pt denies CP, worsening sob, doe, wheezing, orthopnea, pnd, worsening LE edema, palps, dizziness or syncope  Pt denies new neuro symptoms such as headache, facial or extremity weakness  Pt denies polydipsia, polyuria,  Overall good compliance with meds, trying to follow low chol diet, wt stable, little excercise however .  No fever, wt loss, night sweats, loss of appetite or other constitutional symptoms  Denies worsening depressive symptoms, suicidal ideation, or panic.   Overall good compliance with meds, and good tolerability.  Pt states good ability with ADL's, low fall risk, home safety reviewed and adequate, no significant change in hearing or vision, trying to follow lower chol diet, and occasionally active only with regular excercise.   Problems Prior to Update: 1)  Abnormal Lung Xray  (ICD-793.1) 2)  Dyspnea  (ICD-786.09) 3)  Cough, Chronic  (ICD-786.2) 4)  Hypertension  (ICD-401.9) 5)  Osteopenia  (ICD-733.90) 6)  Rhinosinusitis, Acute  (ICD-461.8) 7)  Hot Flashes  (ICD-627.2) 8)  Ankle Pain, Left  (ICD-719.47) 9)  Thrombocytopenia  (ICD-287.5) 10)  Gastritis  (ICD-535.50) 11)  Insomnia, Chronic  (ICD-307.42) 12)  Cystitis  (ICD-595.9) 13)  Anemia  (ICD-285.9) 14)  Adenocarcinoma, Colon, Family Hx  (ICD-V16.0) 15)  Hemorrhoids   (ICD-455.6) 16)  Diverticulosis, Colon  (ICD-562.10) 17)  Hiatal Hernia  (ICD-553.3) 18)  Headache  (ICD-784.0) 19)  Polycystic Kidney Disease  (ICD-753.12) 20)  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) 21)  Acquired Cyst of Kidney  (ICD-593.2) 22)  Encounter For Long-term Use of Other Medications  (ICD-V58.69) 23)  Abdominal Pain  (ICD-789.00) 24)  Pelvic Pain  (ICD-789.09) 25)  Hematuria Unspecified  (ICD-599.70) 26)  Irritable Bowel Syndrome  (ICD-564.1) 27)  Acute Cystitis  (ICD-595.0) 28)  Chronic Rhinitis  (ICD-472.0) 29)  Palpitations  (ICD-785.1) 30)  Depression  (ICD-311) 31)  Urinary Frequency  (ICD-788.41) 32)  Fibromyalgia  (ICD-729.1) 33)  Fatigue  (ICD-780.79) 34)  Gerd  (ICD-530.81) 35)  Pulmonary Nodule, Right Upper Lobe  (ICD-518.89) 36)  Insomnia-sleep Disorder-unspec  (ICD-307.40) 37)  Osteoporosis  (ICD-733.00) 38)  Essential Hypertension  (ICD-401.9) 39)  Hyperlipidemia  (ICD-272.2)  Medications Prior to Update: 1)  Crestor 20 Mg Tabs (Rosuvastatin Calcium) .... One By Mouth Once Daily 2)  Oscal 500/200 D-3 500-200 Mg-Unit  Tabs (Calcium-Vitamin D) .... By Mouth Once Daily 3)  Sertraline Hcl 100 Mg Tabs (Sertraline Hcl) .... One By Mouth Qd 4)  Multivitamins   Tabs (Multiple Vitamin) .Marland Kitchen.. 1 By Mouth Once Daily 5)  Zolpidem Tartrate 10 Mg Tabs (Zolpidem Tartrate) .... One  By Mouth At Bedtime Prn 6)  Gabapentin 100 Mg Caps (Gabapentin) .... One To Two Tabs By Mouth At Bedtime As Directed 7)  Cozaar 50 Mg  Tabs (Losartan Potassium) .... One Tablet By Mouth Daily 8)  Fluticasone Propionate 50 Mcg/act Susp (Fluticasone Propionate) .... One To Two Puffs Twice Daily 9)  Nexium 40 Mg  Cpdr (Esomeprazole Magnesium) .... By Mouth Daily. Take One Half Hour Before Eating. 10)  Tramadol Hcl 50 Mg  Tabs (Tramadol Hcl) .... One To Two By Mouth Every 4-6 Hours 11)  Chlor-Trimeton 4 Mg Tabs (Chlorpheniramine Maleate) .... One Every 6 Hours If  Needed  Current Medications (verified): 1)  Crestor 20 Mg Tabs (Rosuvastatin Calcium) .... One By Mouth Once Daily 2)  Oscal 500/200 D-3 500-200 Mg-Unit  Tabs (Calcium-Vitamin D) .... By Mouth Once Daily 3)  Sertraline Hcl 100 Mg Tabs (Sertraline Hcl) .... One By Mouth Qd 4)  Multivitamins   Tabs (Multiple Vitamin) .Marland Kitchen.. 1 By Mouth Once Daily 5)  Zolpidem Tartrate 10 Mg Tabs (Zolpidem Tartrate) .... One By Mouth At Bedtime Prn 6)  Gabapentin 100 Mg Caps (Gabapentin) .... One To Two Tabs By Mouth At Bedtime As Directed 7)  Cozaar 50 Mg  Tabs (Losartan Potassium) .... One Tablet By Mouth Daily 8)  Fluticasone Propionate 50 Mcg/act Susp (Fluticasone Propionate) .... One To Two Puffs Twice Daily 9)  Nexium 40 Mg  Cpdr (Esomeprazole Magnesium) .... By Mouth Daily. Take One Half Hour Before Eating. 10)  Tramadol Hcl 50 Mg  Tabs (Tramadol Hcl) .... One To Two By Mouth Every 4-6 Hours 11)  Chlor-Trimeton 4 Mg Tabs (Chlorpheniramine Maleate) .... One Every 6 Hours If Needed  Allergies (verified): 1)  ! Sulfa 2)  ! Celebrex 3)  ! Hydrocodone  Past History:  Family History: Last updated: 03/04/2010 Mother had MI at age  104 Father - COPD Siblings with history of nephritis Family History of Colon Cancer: grandmother  sister has hypothyroidism mother had thyroidectomy (benign)    Emphysema- Mother Negative for asthma  Social History: Last updated: 07/07/2010 Occupation:  works for [employer] - retired Former Smoker quit 1977 (5 pack year history) Alcohol use-no   Illicit Drug Use - no  husband died - met prostate ca 2011-03-21Widow/Widower  Risk Factors: Alcohol Use: 0 (03/10/2009) Caffeine Use: 1 cup coffe daily (03/10/2009) Exercise: no (03/10/2009)  Risk Factors: Smoking Status: quit (11/16/2009)  Past Medical History: Reviewed history from 05/20/2010 and no changes required. IBS GERD Anemia  Depression     Hyperlipiidemia Osteopenia S/P fracture of right  shoulder & hip  - Dr Thurston Hole, with post fx right shoulder adhesive capsultiis hx of mild left shoulder adhesive capsulitis 2009 - no surgury Hypertension Cystitis Fibromyalgia Chronic Insomnia Pulmonary nodule - 5 mm anterior RUL - 12/08       Repeat CT of Chest 06/09 - no change         Repeat CT RUL 3 mm Triad 02/19/10 Palpitations Exertional dyspnea     - No sign desats x 185 x3 March 04, 2010  Fatigue Polycystic kidney - followed by Dr. 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 Chronic cough ...........................................................................Marland KitchenWert    - PFT's nl x for truncation of fv loop April 15, 2010     - Sinus ct rec April 15, 2010  > nl     - Allergy profile sent May 20, 2010    -  Trial of Nasal steroids as maint rx May 21, 2010 >>>  Past Surgical History: Total Abdominal Hysterectomy W/ BSO - due to dysfunctional uterine bleeding and endometriosis Cholecystectomy      s/p right shoulder rotater cuff tear/adhesive capsulitis - 2002 hx of right humerus fracture 2002 s/p right hip sugury  2002  stress test neg oct 2009 s/p adhesiolysis Nov 2011  Social History: Reviewed history from 03/04/2010 and no changes required. Occupation:  works for [employer] - retired Former Smoker quit 1977 (5 pack year history) Alcohol use-no   Illicit Drug Use - no  husband died - met prostate ca 03/22/11Widow/Widower  Review of Systems  The patient denies anorexia, fever, vision loss, decreased hearing, hoarseness, chest pain, syncope, dyspnea on exertion, peripheral edema, prolonged cough, headaches, hemoptysis, abdominal pain, melena, hematochezia, severe indigestion/heartburn, hematuria, muscle weakness, suspicious skin lesions, transient blindness, difficulty walking, depression, unusual weight change, abnormal bleeding, enlarged lymph nodes,  and angioedema.         all otherwise negative per pt -    Physical Exam  General:  alert and well-developed.   Head:  normocephalic and atraumatic.   Eyes:  vision grossly intact, pupils equal, and pupils round.   Ears:  R ear normal and L ear normal.   Nose:  no external deformity and no nasal discharge.   Mouth:  no gingival abnormalities and pharynx pink and moist.   Neck:  supple and no masses.   Lungs:  normal respiratory effort and normal breath sounds.   Heart:  normal rate and regular rhythm.   Abdomen:  soft, non-tender, and normal bowel sounds.   Msk:  no joint tenderness and no joint swelling.   Extremities:  no edema, no erythema  Neurologic:  strength normal in all extremities, sensation intact to light touch, and gait normal.   Skin:  color normal and no rashes.   Psych:  not depressed appearing and slightly anxious.     Impression & Recommendations:  Problem # 1:  Preventive Health Care (ICD-V70.0) Overall doing well, age appropriate education and counseling updated, referral for preventive services and immunizations addressed, dietary counseling and smoking status adressed , most recent labs reviewed I have personally reviewed and have noted 1.The patient's medical and social history 2.Their use of alcohol, tobacco or illicit drugs 3.Their current medications and supplements 4. Functional ability including ADL's, fall risk, home safety risk, hearing & visual impairment  5.Diet and physical activities 6.Evidence for depression or mood disorders The patients weight, height, BMI  have been recorded in the chart I have made referrals, counseling and provided education to the patient based review of the above   Problem # 2:  HYPERTENSION (ICD-401.9)  Her updated medication list for this problem includes:    Cozaar 50 Mg Tabs (Losartan potassium) ..... One tablet by mouth daily  BP today: 132/80 Prior BP: 112/76 (05/20/2010)  Labs Reviewed: K+: 4.5  (07/05/2010) Creat: : 1.2 (07/05/2010)   Chol: 161 (07/05/2010)   HDL: 46.70 (07/05/2010)   LDL: 91 (07/05/2010)   TG: 119.0 (07/05/2010) stable overall by hx and exam, ok to continue meds/tx as is   Problem # 3:  HYPERLIPIDEMIA (ICD-272.2)  Her updated medication list for this problem includes:    Crestor 20 Mg Tabs (Rosuvastatin calcium) ..... One by mouth once daily  Labs Reviewed: SGOT: 24 (07/05/2010)   SGPT: 24 (07/05/2010)   HDL:46.70 (07/05/2010), 47.60 (03/10/2009)  LDL:91 (07/05/2010), 76 (03/10/2009)  Chol:161 (07/05/2010), 145 (  03/10/2009)  Trig:119.0 (07/05/2010), 107.0 (03/10/2009) stable overall by hx and exam, ok to continue meds/tx as is   Complete Medication List: 1)  Crestor 20 Mg Tabs (Rosuvastatin calcium) .... One by mouth once daily 2)  Oscal 500/200 D-3 500-200 Mg-unit Tabs (Calcium-vitamin d) .... By mouth once daily 3)  Sertraline Hcl 100 Mg Tabs (Sertraline hcl) .... One by mouth qd 4)  Multivitamins Tabs (Multiple vitamin) .Marland Kitchen.. 1 by mouth once daily 5)  Zolpidem Tartrate 10 Mg Tabs (Zolpidem tartrate) .... One by mouth at bedtime prn 6)  Gabapentin 100 Mg Caps (Gabapentin) .... One to two tabs by mouth at bedtime as directed 7)  Cozaar 50 Mg Tabs (Losartan potassium) .... One tablet by mouth daily 8)  Fluticasone Propionate 50 Mcg/act Susp (Fluticasone propionate) .... One to two puffs twice daily 9)  Nexium 40 Mg Cpdr (Esomeprazole magnesium) .... By mouth daily. take one half hour before eating. 10)  Tramadol Hcl 50 Mg Tabs (Tramadol hcl) .... One to two by mouth every 4-6 hours 11)  Chlor-trimeton 4 Mg Tabs (Chlorpheniramine maleate) .... One every 6 hours if needed  Patient Instructions: 1)  Continue all previous medications as before this visit  2)  Please schedule a follow-up appointment in 1 year., or sooner if needed Prescriptions: CRESTOR 20 MG TABS (ROSUVASTATIN CALCIUM) one by mouth once daily  #90 x 3   Entered and Authorized by:   Corwin Levins  MD   Signed by:   Corwin Levins MD on 07/07/2010   Method used:   Electronically to        [facility] [address] [phone]* (retail)       [address]       [place], [place]  [address]       Ph: [phone] or [phone]       Fax: [phone]   RxID:   [id number]    Orders Added: 1)  Est. Patient 65& > [[id number]]

## 2010-09-02 NOTE — Progress Notes (Signed)
Summary: CT SCAN RESULTS  Phone Note Call from Patient   Summary of Call: PT IS REQUESTING CT SCAN RESULTS--PER PT THIS IS THIRD TIME REQUESTING.  PLEASE CALL PT @ HER OFFICE:  [phone] Initial call taken by: Ivar Bury,  January 23, 2008 11:09 AM  Follow-up for Phone Call        patient informed per Dr Artist Pais instruction. She states previous messages was not received Follow-up by: Glendell Docker,  January 23, 2008 12:11 PM

## 2010-09-03 NOTE — Consult Note (Signed)
Summary: [facility]  [facility]   Imported By: Lanelle Bal 04/29/2010 12:41:24  _____________________________________________________________________  External Attachment:    Type:   Image     Comment:   External Document

## 2010-09-03 NOTE — Consult Note (Signed)
Summary: [facility]  [facility]   Imported By: Esmeralda Links D'jimraou 10/23/2007 13:34:43  _____________________________________________________________________  External Attachment:    Type:   Image     Comment:   External Document

## 2010-09-08 NOTE — Progress Notes (Signed)
Summary: Zolpidem Refill  Phone Note Refill Request Message from:  Fax from Pharmacy on August 31, 2010 3:09 PM  Refills Requested: Medication #1:  ZOLPIDEM TARTRATE 10 MG TABS one by mouth at bedtime prn   Dosage confirmed as above?Dosage Confirmed   Brand Name Necessary? No   Supply Requested: 3 months   Last Refilled: 03/26/2010 [facility] [address] [place], [place] [address] fax [phone]   Method Requested: Electronic Next Appointment Scheduled: none Initial call taken by: Elba Barman,  August 31, 2010 3:12 PM  Follow-up for Phone Call        done hardcopy to LIM side B - dahlia  Follow-up by: Corwin Levins MD,  August 31, 2010 6:00 PM  Additional Follow-up for Phone Call Additional follow up Details #1::        Rx faxed to pharmacy Additional Follow-up by: Margaret Pyle, CMA,  September 01, 2010 8:31 AM    New/Updated Medications: ZOLPIDEM TARTRATE 10 MG TABS (ZOLPIDEM TARTRATE) one by mouth at bedtime as needed Prescriptions: ZOLPIDEM TARTRATE 10 MG TABS (ZOLPIDEM TARTRATE) one by mouth at bedtime as needed  #90 x 1   Entered and Authorized by:   Corwin Levins MD   Signed by:   Corwin Levins MD on 08/31/2010   Method used:   Print then Give to Patient   RxID:   [id number]

## 2010-10-12 LAB — BLOOD GAS, ARTERIAL
Acid-Base Excess: 0.8 mmol/L (ref 0.0–2.0)
Acid-Base Excess: 1.6 mmol/L (ref 0.0–2.0)
Bicarbonate: 27.1 mEq/L — ABNORMAL HIGH (ref 20.0–24.0)
Bicarbonate: 27.5 mEq/L — ABNORMAL HIGH (ref 20.0–24.0)
Drawn by: 307971
FIO2: 0.21 %
O2 Saturation: 84.5 %
O2 Saturation: 89.5 %
Patient temperature: 98.6
Patient temperature: 98.6
TCO2: 24.9 mmol/L (ref 0–100)
TCO2: 25 mmol/L (ref 0–100)
pCO2 arterial: 51.2 mmHg — ABNORMAL HIGH (ref 35.0–45.0)
pCO2 arterial: 53.6 mmHg — ABNORMAL HIGH (ref 35.0–45.0)
pH, Arterial: 7.325 — ABNORMAL LOW (ref 7.350–7.400)
pH, Arterial: 7.349 — ABNORMAL LOW (ref 7.350–7.400)
pO2, Arterial: 49.1 mmHg — ABNORMAL LOW (ref 80.0–100.0)
pO2, Arterial: 56.7 mmHg — ABNORMAL LOW (ref 80.0–100.0)

## 2010-10-12 LAB — RENAL FUNCTION PANEL
Albumin: 3.1 g/dL — ABNORMAL LOW (ref 3.5–5.2)
BUN: 11 mg/dL (ref 6–23)
CO2: 26 mEq/L (ref 19–32)
Calcium: 8.4 mg/dL (ref 8.4–10.5)
Chloride: 104 mEq/L (ref 96–112)
Creatinine, Ser: 1.17 mg/dL (ref 0.4–1.2)
GFR calc Af Amer: 56 mL/min — ABNORMAL LOW (ref >60)
GFR calc non Af Amer: 46 mL/min — ABNORMAL LOW (ref >60)
Glucose, Bld: 81 mg/dL (ref 70–99)
Phosphorus: 2.2 mg/dL — ABNORMAL LOW (ref 2.3–4.6)
Potassium: 3.5 mEq/L (ref 3.5–5.1)
Sodium: 142 mEq/L (ref 135–145)

## 2010-10-12 LAB — COMPREHENSIVE METABOLIC PANEL
ALT: 13 U/L (ref 0–35)
ALT: 13 U/L (ref 0–35)
ALT: 14 U/L (ref 0–35)
ALT: 18 U/L (ref 0–35)
ALT: 20 U/L (ref 0–35)
ALT: 21 U/L (ref 0–35)
AST: 14 U/L (ref 0–37)
AST: 15 U/L (ref 0–37)
AST: 17 U/L (ref 0–37)
AST: 17 U/L (ref 0–37)
AST: 18 U/L (ref 0–37)
AST: 19 U/L (ref 0–37)
Albumin: 2.2 g/dL — ABNORMAL LOW (ref 3.5–5.2)
Albumin: 2.3 g/dL — ABNORMAL LOW (ref 3.5–5.2)
Albumin: 2.9 g/dL — ABNORMAL LOW (ref 3.5–5.2)
Albumin: 3.6 g/dL (ref 3.5–5.2)
Albumin: 3.6 g/dL (ref 3.5–5.2)
Albumin: 3.7 g/dL (ref 3.5–5.2)
Alkaline Phosphatase: 28 U/L — ABNORMAL LOW (ref 39–117)
Alkaline Phosphatase: 32 U/L — ABNORMAL LOW (ref 39–117)
Alkaline Phosphatase: 37 U/L — ABNORMAL LOW (ref 39–117)
Alkaline Phosphatase: 46 U/L (ref 39–117)
Alkaline Phosphatase: 51 U/L (ref 39–117)
Alkaline Phosphatase: 51 U/L (ref 39–117)
BUN: 1 mg/dL — ABNORMAL LOW (ref 6–23)
BUN: 10 mg/dL (ref 6–23)
BUN: 13 mg/dL (ref 6–23)
BUN: 2 mg/dL — ABNORMAL LOW (ref 6–23)
BUN: 21 mg/dL (ref 6–23)
BUN: 8 mg/dL (ref 6–23)
CO2: 26 mEq/L (ref 19–32)
CO2: 28 mEq/L (ref 19–32)
CO2: 29 mEq/L (ref 19–32)
CO2: 30 mEq/L (ref 19–32)
CO2: 30 mEq/L (ref 19–32)
CO2: 33 mEq/L — ABNORMAL HIGH (ref 19–32)
Calcium: 7.4 mg/dL — ABNORMAL LOW (ref 8.4–10.5)
Calcium: 7.7 mg/dL — ABNORMAL LOW (ref 8.4–10.5)
Calcium: 8.5 mg/dL (ref 8.4–10.5)
Calcium: 9.2 mg/dL (ref 8.4–10.5)
Calcium: 9.2 mg/dL (ref 8.4–10.5)
Calcium: 9.3 mg/dL (ref 8.4–10.5)
Chloride: 104 mEq/L (ref 96–112)
Chloride: 105 mEq/L (ref 96–112)
Chloride: 105 mEq/L (ref 96–112)
Chloride: 106 mEq/L (ref 96–112)
Chloride: 108 mEq/L (ref 96–112)
Chloride: 113 mEq/L — ABNORMAL HIGH (ref 96–112)
Creatinine, Ser: 0.89 mg/dL (ref 0.4–1.2)
Creatinine, Ser: 0.89 mg/dL (ref 0.4–1.2)
Creatinine, Ser: 1.28 mg/dL — ABNORMAL HIGH (ref 0.4–1.2)
Creatinine, Ser: 1.33 mg/dL — ABNORMAL HIGH (ref 0.4–1.2)
Creatinine, Ser: 1.35 mg/dL — ABNORMAL HIGH (ref 0.4–1.2)
Creatinine, Ser: 1.46 mg/dL — ABNORMAL HIGH (ref 0.4–1.2)
GFR calc Af Amer: 43 mL/min — ABNORMAL LOW (ref >60)
GFR calc Af Amer: 47 mL/min — ABNORMAL LOW (ref >60)
GFR calc Af Amer: 48 mL/min — ABNORMAL LOW (ref >60)
GFR calc Af Amer: 50 mL/min — ABNORMAL LOW (ref >60)
GFR calc Af Amer: >60 mL/min (ref >60)
GFR calc Af Amer: >60 mL/min (ref >60)
GFR calc non Af Amer: 36 mL/min — ABNORMAL LOW (ref >60)
GFR calc non Af Amer: 39 mL/min — ABNORMAL LOW (ref >60)
GFR calc non Af Amer: 40 mL/min — ABNORMAL LOW (ref >60)
GFR calc non Af Amer: 41 mL/min — ABNORMAL LOW (ref >60)
GFR calc non Af Amer: >60 mL/min (ref >60)
GFR calc non Af Amer: >60 mL/min (ref >60)
Glucose, Bld: 101 mg/dL — ABNORMAL HIGH (ref 70–99)
Glucose, Bld: 107 mg/dL — ABNORMAL HIGH (ref 70–99)
Glucose, Bld: 118 mg/dL — ABNORMAL HIGH (ref 70–99)
Glucose, Bld: 121 mg/dL — ABNORMAL HIGH (ref 70–99)
Glucose, Bld: 128 mg/dL — ABNORMAL HIGH (ref 70–99)
Glucose, Bld: 97 mg/dL (ref 70–99)
Potassium: 3.3 mEq/L — ABNORMAL LOW (ref 3.5–5.1)
Potassium: 3.3 mEq/L — ABNORMAL LOW (ref 3.5–5.1)
Potassium: 3.9 mEq/L (ref 3.5–5.1)
Potassium: 3.9 mEq/L (ref 3.5–5.1)
Potassium: 4.5 mEq/L (ref 3.5–5.1)
Potassium: 4.8 mEq/L (ref 3.5–5.1)
Sodium: 137 mEq/L (ref 135–145)
Sodium: 142 mEq/L (ref 135–145)
Sodium: 143 mEq/L (ref 135–145)
Sodium: 144 mEq/L (ref 135–145)
Sodium: 145 mEq/L (ref 135–145)
Sodium: 145 mEq/L (ref 135–145)
Total Bilirubin: 0.2 mg/dL — ABNORMAL LOW (ref 0.3–1.2)
Total Bilirubin: 0.3 mg/dL (ref 0.3–1.2)
Total Bilirubin: 0.4 mg/dL (ref 0.3–1.2)
Total Bilirubin: 0.4 mg/dL (ref 0.3–1.2)
Total Bilirubin: 0.4 mg/dL (ref 0.3–1.2)
Total Bilirubin: 0.6 mg/dL (ref 0.3–1.2)
Total Protein: 4.4 g/dL — ABNORMAL LOW (ref 6.0–8.3)
Total Protein: 4.5 g/dL — ABNORMAL LOW (ref 6.0–8.3)
Total Protein: 5.1 g/dL — ABNORMAL LOW (ref 6.0–8.3)
Total Protein: 6.2 g/dL (ref 6.0–8.3)
Total Protein: 6.7 g/dL (ref 6.0–8.3)
Total Protein: 6.8 g/dL (ref 6.0–8.3)

## 2010-10-12 LAB — BASIC METABOLIC PANEL
BUN: 12 mg/dL (ref 6–23)
BUN: 4 mg/dL — ABNORMAL LOW (ref 6–23)
BUN: 6 mg/dL (ref 6–23)
CO2: 26 mEq/L (ref 19–32)
CO2: 28 mEq/L (ref 19–32)
CO2: 34 mEq/L — ABNORMAL HIGH (ref 19–32)
Calcium: 7.2 mg/dL — ABNORMAL LOW (ref 8.4–10.5)
Calcium: 8.4 mg/dL (ref 8.4–10.5)
Calcium: 8.8 mg/dL (ref 8.4–10.5)
Chloride: 103 mEq/L (ref 96–112)
Chloride: 106 mEq/L (ref 96–112)
Chloride: 109 mEq/L (ref 96–112)
Creatinine, Ser: 0.92 mg/dL (ref 0.4–1.2)
Creatinine, Ser: 1.17 mg/dL (ref 0.4–1.2)
Creatinine, Ser: 1.35 mg/dL — ABNORMAL HIGH (ref 0.4–1.2)
GFR calc Af Amer: 47 mL/min — ABNORMAL LOW (ref >60)
GFR calc Af Amer: 56 mL/min — ABNORMAL LOW (ref >60)
GFR calc Af Amer: >60 mL/min (ref >60)
GFR calc non Af Amer: 39 mL/min — ABNORMAL LOW (ref >60)
GFR calc non Af Amer: 46 mL/min — ABNORMAL LOW (ref >60)
GFR calc non Af Amer: >60 mL/min (ref >60)
Glucose, Bld: 119 mg/dL — ABNORMAL HIGH (ref 70–99)
Glucose, Bld: 137 mg/dL — ABNORMAL HIGH (ref 70–99)
Glucose, Bld: 83 mg/dL (ref 70–99)
Potassium: 3.6 mEq/L (ref 3.5–5.1)
Potassium: 3.6 mEq/L (ref 3.5–5.1)
Potassium: 4.2 mEq/L (ref 3.5–5.1)
Sodium: 141 mEq/L (ref 135–145)
Sodium: 142 mEq/L (ref 135–145)
Sodium: 144 mEq/L (ref 135–145)

## 2010-10-12 LAB — DIFFERENTIAL
Basophils Absolute: 0 10*3/uL (ref 0.0–0.1)
Basophils Relative: 0 % (ref 0–1)
Eosinophils Absolute: 0 10*3/uL (ref 0.0–0.7)
Eosinophils Relative: 0 % (ref 0–5)
Lymphocytes Relative: 16 % (ref 12–46)
Lymphs Abs: 1.3 10*3/uL (ref 0.7–4.0)
Monocytes Absolute: 0.4 10*3/uL (ref 0.1–1.0)
Monocytes Relative: 5 % (ref 3–12)
Neutro Abs: 6.2 10*3/uL (ref 1.7–7.7)
Neutrophils Relative %: 79 % — ABNORMAL HIGH (ref 43–77)

## 2010-10-12 LAB — URINALYSIS, ROUTINE W REFLEX MICROSCOPIC
Bilirubin Urine: NEGATIVE
Glucose, UA: NEGATIVE mg/dL
Hgb urine dipstick: NEGATIVE
Ketones, ur: NEGATIVE mg/dL
Nitrite: NEGATIVE
Protein, ur: NEGATIVE mg/dL
Specific Gravity, Urine: 1.019 (ref 1.005–1.030)
Urobilinogen, UA: 0.2 mg/dL (ref 0.0–1.0)
pH: 7 (ref 5.0–8.0)

## 2010-10-12 LAB — CBC
HCT: 28.1 % — ABNORMAL LOW (ref 36.0–46.0)
HCT: 30.1 % — ABNORMAL LOW (ref 36.0–46.0)
HCT: 32.5 % — ABNORMAL LOW (ref 36.0–46.0)
HCT: 34.1 % — ABNORMAL LOW (ref 36.0–46.0)
HCT: 35.7 % — ABNORMAL LOW (ref 36.0–46.0)
HCT: 37.1 % (ref 36.0–46.0)
HCT: 38.1 % (ref 36.0–46.0)
Hemoglobin: 10 g/dL — ABNORMAL LOW (ref 12.0–15.0)
Hemoglobin: 11.1 g/dL — ABNORMAL LOW (ref 12.0–15.0)
Hemoglobin: 11.5 g/dL — ABNORMAL LOW (ref 12.0–15.0)
Hemoglobin: 12.1 g/dL (ref 12.0–15.0)
Hemoglobin: 12.4 g/dL (ref 12.0–15.0)
Hemoglobin: 12.7 g/dL (ref 12.0–15.0)
Hemoglobin: 9.5 g/dL — ABNORMAL LOW (ref 12.0–15.0)
MCH: 29.4 pg (ref 26.0–34.0)
MCH: 29.5 pg (ref 26.0–34.0)
MCH: 29.5 pg (ref 26.0–34.0)
MCH: 29.6 pg (ref 26.0–34.0)
MCH: 29.7 pg (ref 26.0–34.0)
MCH: 29.7 pg (ref 26.0–34.0)
MCH: 29.7 pg (ref 26.0–34.0)
MCHC: 33.2 g/dL (ref 30.0–36.0)
MCHC: 33.4 g/dL (ref 30.0–36.0)
MCHC: 33.5 g/dL (ref 30.0–36.0)
MCHC: 33.7 g/dL (ref 30.0–36.0)
MCHC: 33.9 g/dL (ref 30.0–36.0)
MCHC: 34 g/dL (ref 30.0–36.0)
MCHC: 34 g/dL (ref 30.0–36.0)
MCV: 87.1 fL (ref 78.0–100.0)
MCV: 87.1 fL (ref 78.0–100.0)
MCV: 87.5 fL (ref 78.0–100.0)
MCV: 87.8 fL (ref 78.0–100.0)
MCV: 88.2 fL (ref 78.0–100.0)
MCV: 88.6 fL (ref 78.0–100.0)
MCV: 89 fL (ref 78.0–100.0)
Platelets: 138 10*3/uL — ABNORMAL LOW (ref 150–400)
Platelets: 139 10*3/uL — ABNORMAL LOW (ref 150–400)
Platelets: 157 10*3/uL (ref 150–400)
Platelets: 162 10*3/uL (ref 150–400)
Platelets: 163 10*3/uL (ref 150–400)
Platelets: 164 10*3/uL (ref 150–400)
Platelets: 175 10*3/uL (ref 150–400)
RBC: 3.22 MIL/uL — ABNORMAL LOW (ref 3.87–5.11)
RBC: 3.39 MIL/uL — ABNORMAL LOW (ref 3.87–5.11)
RBC: 3.72 MIL/uL — ABNORMAL LOW (ref 3.87–5.11)
RBC: 3.86 MIL/uL — ABNORMAL LOW (ref 3.87–5.11)
RBC: 4.1 MIL/uL (ref 3.87–5.11)
RBC: 4.23 MIL/uL (ref 3.87–5.11)
RBC: 4.28 MIL/uL (ref 3.87–5.11)
RDW: 14.7 % (ref 11.5–15.5)
RDW: 14.8 % (ref 11.5–15.5)
RDW: 14.9 % (ref 11.5–15.5)
RDW: 14.9 % (ref 11.5–15.5)
RDW: 15 % (ref 11.5–15.5)
RDW: 15.3 % (ref 11.5–15.5)
RDW: 15.4 % (ref 11.5–15.5)
WBC: 4.6 10*3/uL (ref 4.0–10.5)
WBC: 5.4 10*3/uL (ref 4.0–10.5)
WBC: 5.7 10*3/uL (ref 4.0–10.5)
WBC: 5.9 10*3/uL (ref 4.0–10.5)
WBC: 6.5 10*3/uL (ref 4.0–10.5)
WBC: 7.8 10*3/uL (ref 4.0–10.5)
WBC: 8 10*3/uL (ref 4.0–10.5)

## 2010-10-12 LAB — LIPASE, BLOOD
Lipase: 28 U/L (ref 11–59)
Lipase: 41 U/L (ref 11–59)

## 2010-10-12 LAB — D-DIMER, QUANTITATIVE (NOT AT ARMC): D-Dimer, Quant: 0.37 ug/mL-FEU (ref 0.00–0.48)

## 2010-10-12 LAB — MAGNESIUM: Magnesium: 1.7 mg/dL (ref 1.5–2.5)

## 2010-10-12 LAB — SEDIMENTATION RATE: Sed Rate: 11 mm/hr (ref 0–22)

## 2010-11-26 ENCOUNTER — Telehealth: Payer: Self-pay | Admitting: *Deleted

## 2010-11-26 NOTE — Telephone Encounter (Signed)
rec summary of benefits from [employer]... Pt's OOP is $0. Pt was due today 11-26-10. I advised pt of this and she needs to check her schedule and will call back to schedule nurse visit sometime within the next 2-3 wks.

## 2010-12-02 ENCOUNTER — Inpatient Hospital Stay (INDEPENDENT_AMBULATORY_CARE_PROVIDER_SITE_OTHER)
Admission: RE | Admit: 2010-12-02 | Discharge: 2010-12-02 | Disposition: A | Discharge status: Home / Self Care | Payer: Medicare Other | Source: Ambulatory Visit | Attending: Emergency Medicine | Admitting: Emergency Medicine

## 2010-12-02 DIAGNOSIS — L02419 Cutaneous abscess of limb, unspecified: Secondary | ICD-10-CM

## 2010-12-02 DIAGNOSIS — L03119 Cellulitis of unspecified part of limb: Secondary | ICD-10-CM

## 2010-12-07 ENCOUNTER — Ambulatory Visit (INDEPENDENT_AMBULATORY_CARE_PROVIDER_SITE_OTHER): Payer: Medicare Other

## 2010-12-07 DIAGNOSIS — M810 Age-related osteoporosis without current pathological fracture: Secondary | ICD-10-CM

## 2010-12-07 MED ORDER — DENOSUMAB 60 MG/ML ~~LOC~~ SOLN
60.0000 mg | Freq: Once | SUBCUTANEOUS | Status: AC
  Administered 2010-12-07: 60 mg via SUBCUTANEOUS

## 2010-12-14 NOTE — Discharge Summary (Signed)
NAMEKEONDA, DOW              ACCOUNT NO.:  [id number]   MEDICAL RECORD NO.:  [id number]          PATIENT TYPE:  INP   LOCATION:  5017                         FACILITY:  [facility]   PHYSICIAN:  Elana Alm. Thurston Hole, M.D. DATE OF BIRTH:  [birth year]   DATE OF ADMISSION:  02/21/2007  DATE OF DISCHARGE:  03/01/2007                               DISCHARGE SUMMARY   ADMISSION DIAGNOSES:  1. Right hip femoral neck fracture.  2. Right shoulder proximal humerus fracture.  3. Hypertension.  4. High cholesterol.  5. Fibromyalgia.  6. Depression.   DISCHARGE DIAGNOSES:  1. Right hip femoral neck fracture, status post right hip      hemiarthroplasty.  2. Right shoulder proximal humerus fracture.  3. Hypertension.  4. High cholesterol.  5. Fibromyalgia.  6. Depression.  7. Postop blood loss anemia.  8. Hypokalemia.  9. Long term use of anticoagulants for deep vein thrombosis      prophylaxis.   HISTORY OF PRESENT ILLNESS:  The patient is a 70 year old white female  who fell in our office on Monday, February 19, 2007, injuring her right hip  and right shoulder.  She was seen at [facility] emergency room on Monday  night and was noted to have a right proximal humerus fracture.  No x-  rays of her pelvis or hip were done at that time.  She continued to have  persistent pain and difficulty with ambulation.  She presented to our  office on February 21, 2007, and was noted to have a displaced right hip  femoral neck fracture as well as a proximal humerus fracture.  She was  admitted emergently for surgical intervention.   HOSPITAL COURSE:  On February 22, 2007, the patient underwent a right hip  hemiarthroplasty press fit by Dr. Thurston Hole.  She tolerated the procedure  well and postoperatively was placed on Lovenox and Coumadin for DVT  prophylaxis.  Weight bearing was limited to 20%.  Physical therapy was  ordered.  Occupational therapy was ordered.  Rehab consult was ordered.  Social work consult was  ordered.   Postop day one, hemoglobin was 9.3.  T-max was 100.2.  PCA was  discontinued.  She was placed on p.o. pain medicine.   On postop day two, T-max was 100.4 with hemoglobin 8.3 and INR 1.5.  She  was metabolically stable.  Rehab consult felt that she would be an  excellent candidate for inpatient rehab as she was very compliant with  physical therapy.  She was given a Dulcolax suppository for  constipation.   Postop day three, suppository was ineffective.  She still had difficulty  with constipation.  Her hemoglobin was 8.5.  She continued to  participate well with physical therapy.   Postop day four, the patient had significant constipation relief with a  Fleet Phospho-Soda.  However, she is now hypokalemic, and her hemoglobin  is down to 8.  She is still asymptomatic with hemoglobin at 8.  Her  potassium is replenished.  She will continue on Coumadin and Lovenox.   Postop day five, the patient continues to be very compliant with  physical therapy.  She was very good at maintaining 20% weight bearing  on her right lower extremity.  She ambulated slowly with a four-prong  cane.  Her hemoglobin was 7.9.  Pulse was 106.  She was hypotensive at  94/58.  Due to her tachycardiac and hypotensive as well as hemoglobin  being 7.9, she was transfused 2 units of packed red blood cells for  symptomatic postop blood loss anemia.  She tolerated the transfusion  well.  We are awaiting rehab bed on her.  She is very compliant with  physical therapy with both upper and lower extremities.  However, she is  not safe to go home as her husband is disabled and his primary mode of  mobility is a walker or a wheelchair.   Postop day six, her pulse was 79.  Her hemoglobin was up to 11.2.  She  is metabolically stable.  Blood pressure improved to 128/76 post  transfusion.  She is now medically stable, and we are awaiting a rehab  bed for her as it is not safe for her to go home.  Her INR is   therapeutic at 2.3, so Lovenox has been discontinued.   DISCHARGE MEDICATIONS:  1. Coumadin per pharmacy protocol.  Her Coumadin dose while she had      been in the hospital on February 22, 2007 was 5 mg.  On February 23, 2007,      she received 5 mg.  On February 24, 2007, she received 5 mg.  On February 25, 2007, she received 7.5 mg.  On February 26, 2007, she received 7.5      mg.  On February 27, 2007, she received 1 mg.  On February 28, 2007, she      received 5 mg.  2. Colace 100 mg 1 tablet twice daily.  3. Crestor 10 mg 1 tablet once daily.  4. Norvasc 2.5 mg 1 tablet once daily.  5. Cozaar 25 mg 1 tablet once daily.  6. Multivitamin 1 tablet once daily.  7. Os-Cal 500 mg twice daily.  8. Nexium 40 mg daily.  9. Zoloft 50 mg daily.  10.Vivelle patch 0.025 twice weekly.  11.Dilaudid 2 mg 1-2 tablets q.4 h. p.r.n. pain.  12.Ambien 10 mg p.o. nightly p.r.n. sleep.  13.Methocarbamol 500 mg p.o. q.6 h. p.r.n. muscle spasm.  14.Tylenol 650 mg q.4 h. p.r.n. fever greater than 101 or mild pain.   DISCHARGE INSTRUCTIONS:  Per physical therapy.  She is non-weight  bearing on her right upper extremity.  She is allowed full active and  passive range of motion of her right elbow, wrist and hand.  She is 20%  weight bearing on her right lower extremity with posterior total hip  precautions.  She is allowed full range of motion actively and passively  of her knee, foot and ankle.  She has been wearing TED hose during the  day for DVT prophylaxis.  These are to be removed every night for skin  checks.  She has a slight bit of an area of sensitivity with redness on  her right heel.  We recommend that her held be floated daily while in  bed.  We recommend that she sleep with a pillow between her legs.  She  will need to follow up in Dr. Sherene Sires office in one week which is  March 08, 2007, for x-rays and staple removal.  She is on a regular diet.  She is  to call our office at [phone] to schedule an  appointment.  She is to  also call our office at [phone] if she has any drainage from her right  hip wound if she has any redness, swelling, increased pain or a  temperature greater than 101.      Kirstin Shepperson, P.A.      Robert A. Thurston Hole, M.D.  Electronically Signed    KS/MEDQ  D:  03/01/2007  T:  03/01/2007  Job:  [id number]

## 2010-12-14 NOTE — Op Note (Signed)
Sandra Craig, Sandra Craig              ACCOUNT NO.:  [id number]   MEDICAL RECORD NO.:  [id number]          PATIENT TYPE:  INP   LOCATION:  5017                         FACILITY:  [facility]   PHYSICIAN:  Elana Alm. Thurston Hole, M.D. DATE OF BIRTH:  28-Oct-1940   DATE OF PROCEDURE:  02/22/2007  DATE OF DISCHARGE:                               OPERATIVE REPORT   PREOPERATIVE DIAGNOSIS:  Right hip femoral neck fracture.   POSTOPERATIVE DIAGNOSIS:  Right hip femoral neck fracture.   PROCEDURE:  Right hip bipolar prosthesis for fracture using S-ROM Press-  Fit bipolar system with 16 x 11 mm stem, with 84F small sleeve, with 28  +3 hip ball and 48 mm bipolar head.   SURGEON:  Elana Alm. Thurston Hole, M.D.   ASSISTANT:  Julien Girt, P.A.-C.   ANESTHESIA:  General.   OPERATIVE TIME:  1 hour.   COMPLICATIONS:  None.   DESCRIPTION OF PROCEDURE:  Sandra Craig was brought to the operating room  on February 22, 2007, and placed on the operating table in a supine  position.  She received Ancef 1 gram IV preoperatively for prophylaxis.  After being placed under general anesthesia, she was turned to the right  lateral decubitus position and secured on the bed with a Mark frame.  Her right hip and leg was prepped using sterile DuraPrep and draped  using sterile technique.  Originally, through a 10 cm posterolateral  greater trochanteric incision, the initial exposure was made.  The  underlying subcutaneous tissues were incised along with the skin  incision.  The iliotibial band and gluteus maximus fascia was incised  longitudinally revealing the underlying short external rotators of the  hip and hip capsule.  The sciatic nerve was carefully protected while  the short external rotators of the hip and hip capsule were released off  their femoral neck insertion intact.  The femoral neck fracture was  easily identified.  The femoral head was removed from the acetabulum.  There were minimal degenerative changes.   The femoral head was sent for  pathologic review.  Minimal degenerative changes noted in the acetabulum  as well.   The femoral head was measured for diameter.  48 mm was found to be the  correct diameter and the 48 mm trial head was placed in the acetabulum  and found to give an excellent fit.  It was then removed.  The proximal  femur was then exposed.  Sequential reamers were used to ream up to an  11.5 mm distal reaming size and then reaming to a 16 mm proximal size.  The calcar reamer for the sleeve was then reamed to a 84F small.  The  trial stem and trial sleeve was then placed in with a 28 +3 hip ball and  48 mm bipolar trial.  The hip was reduced, taken through a full range of  motion, found to be stable and leg lengths equal.  After this was done,  the trial components were removed.   At this point then, the 16 small sleeve was hammered into position with  an excellent press fit and then  the 16 x 11 femoral stem was hammered in  through the sleeve in the appropriate manner anteversion, again with an  excellent press fit, and then the 28 +3 hip ball and the 48 mm bipolar  head was placed on the femoral neck with an excellent Morse taper fit.  The hip was reduced, again taken through a full range of motion and  found to be stable, and leg lengths equal.  At this point, it was felt  that all components were of excellent size and stability.  The wound was  further irrigated and the short external rotators of the hip and hip  capsule were reattached to the femoral neck through two drill holes in  the greater trochanter.  The iliotibial band and gluteus maximus fascia  was closed with #1 Ethilon suture.  The subcutaneous tissues were closed  with 0 and 2-0 Vicryl, the  subcuticular layer closed with 4-0 Monocryl.  Sterile dressings and a long leg splint were then applied.  The patient  turned supine, checked for leg lengths equal, rotation equal, pulses 2+  and symmetric.  She was  then awakened, extubated, and taken to recovery  in stable condition.  Needle and sponge count was correct x2 at the end  of the case.      Robert A. Thurston Hole, M.D.  Electronically Signed     RAW/MEDQ  D:  02/23/2007  T:  02/24/2007  Job:  [id number]

## 2010-12-14 NOTE — Assessment & Plan Note (Signed)
[facility]                            CARDIOLOGY OFFICE NOTE   NAME:JOHNSONSharone, Picchi                     MRN:          [id number]  DATE:05/21/2008                            DOB:          [birth year]    REFERRING PHYSICIAN:  Barbette Hair. Artist Pais, DO   REASON FOR CONSULTATION:  Shortness of breath and irregular heartbeat.   HISTORY OF PRESENT ILLNESS:  Ms. Stum is a delightful 70 year old  woman with a history of fibromyalgia as well as hypertension.  Last  year, she fell and broke her hip and shoulder.  She underwent surgical  repair.  She has been having some problems with the healing.  She went  to see her orthopedist a few months back and noticed that she has  somewhat of an irregular pulse.  She is referred back to Dr. Artist Pais who in  turn referred her to [place].  She says occasionally she feels an extra beat  at night and then feels like a rush of adrenaline.  She has had no  sustained palpitations.  No syncope or pre syncope.  She is fairly  active despite her hip problem, walks her dogs for about 15-20 minutes  twice a day, goes up and down hills without problems.  However, she does  note that when she gets up at night to go to the bathroom, she often  feels short of breath and somewhat winded.  It is hard for her to get a  deep breath.  She also notes severe fatigue which has been chronic for  her.   REVIEW OF SYSTEMS:  She denies any orthopnea or PND.  No lower extremity  edema.  No chest pressure.  Remainder review of systems is negative  expect for HPI and problem list.   PROBLEM LIST:  1. Fibromyalgia.  2. High blood pressure.  3. History of right hip and shoulder fracture secondary to a fall.  4. Hyperlipidemia.   CURRENT MEDICATIONS:  1. Multivitamin.  2. Os-Cal.  3. Vivelle.  4. Zoloft.  5. Systolic 5 a day.  6. Alendronate.  7. Crestor 20 a day.  8. Cozaar 50 a day.   ALLERGIES:  SULFA, CELEBREX and HYDROCODONE.   SOCIAL HISTORY:   She is married.  No children.  She has a history of  tobacco but quit in 1978.  Does not drink alcohol.   FAMILY HISTORY:  Mother died of myocardial infarction at age 66.   PHYSICAL EXAMINATION:  GENERAL:  She is no acute distress, ambulates  around the clinic without respiratory difficulty.  VITAL SIGNS:  Blood pressure is 114/70, heart rate 62, and weights is  136.  HEENT:  Normal.  NECK:  Supple.  No JVD.  Carotids are 2+ bilaterally without bruits.  There is no lymphadenopathy or thyromegaly.  CARDIAC:  PMI is nondisplaced.  Regular rate and rhythm.  No murmurs,  rubs, or gallops.  No clicks.  LUNGS:  Clear.  ABDOMEN:  Soft, nontender, and nondistended.  No hepatosplenomegaly.  No  bruits.  No masses.  Good bowel sounds.  EXTREMITIES:  Warm with no  cyanosis, clubbing, or edema.  No rash.  NEUROLOGIC:  Alert and oriented x3.  Cranial nerves II through XII are  intact.  Moves all 4 extremities without difficulty.  Affect is  pleasant.   EKG shows normal sinus rhythm with a rate of 62.  No ST-T wave  abnormalities.  Axis and intervals are normal.  There is no  preexcitation.   ASSESSMENT/PLAN:  1. Palpitations.  I suspect that these are PAC's or PVCs.  I will      continue to follow.  If these get worse, she can consider a      monitor.  2. Exertional dyspnea.  This is intermittent.  She has a strong family      history of coronary artery disease.  I think it is reasonable to      pursue echocardiogram and stress test.  She is unable to walk well      due to her hip for stress testing.  I will get an adenosine stress      test with low-level exercise.  3. Fatigue.  I suspect this is multifactorial.  We will see the      results of her stress test and echo show.  She denies any symptoms      consistent with sleep apnea.   DISPOSITION:  Pending the results of her studies.     Bevelyn Buckles. Bensimhon, MD  Electronically Signed    DRB/MedQ  DD: 05/21/2008  DT: 05/22/2008  Job  #: [id number]   cc:   Barbette Hair. Artist Pais, DO

## 2010-12-14 NOTE — H&P (Signed)
NAMESUHANA, WILNER              ACCOUNT NO.:  [id number]   MEDICAL RECORD NO.:  [id number]          PATIENT TYPE:  EMS   LOCATION:  MAJO                         FACILITY:  [facility]   PHYSICIAN:  Elana Alm. Thurston Hole, M.D. DATE OF BIRTH:  Dec 22, 1940   DATE OF ADMISSION:  02/19/2007  DATE OF DISCHARGE:  02/19/2007                              HISTORY & PHYSICAL   ADMITTING DIAGNOSES:  1. Right hip femoral neck fracture.  2. Right shoulder proximal humerus fracture.  3. Hypertension.  4. High cholesterol.  5. Fibromyalgia.  6. Depression.   HISTORY OF PRESENT ILLNESS:  Patient is a 70 year old white female who  fell in her office on Monday, February 19, 2007, was suffering a injury to  her right hip and right shoulder.  She was seen in the [facility]  Emergency Room on Monday night, noted to have a right proximal humerus  fracture.  No x-rays of her pelvis or hip were done at that time.  She  had persistent pain with ambulation, was unable to ambulate and followup  in our office for her persistent right hip pain, as well as her proximal  humerus fracture.  X-rays today noted that she had a right femoral neck  fracture, as well as a right proximal humerus fracture.  She is being  admitted for surgical intervention.   ALLERGIES:  1. SULFA.  2. HYDROCODONE.   CURRENT MEDICATIONS:  Cozaar, Norvasc, Crestor, multivitamin, Os-Cal,  Zoloft and Ambien.   PAST MEDICAL HISTORY:  Significant for:  1. Hypertension.  2. High cholesterol.  3. Fibromyalgia.  4. Depression.   PAST SURGICAL HISTORY:  Significant for:  1. Hysterectomy.  2. Cholecystectomy.  3. Laparoscopy.   SURGICAL COMPLICATIONS:  Hypotension during surgery.   FAMILY HISTORY:  Significant for hypertension.   REVIEW OF SYSTEMS:  Significant for a visual deficit, corrective  eyeglasses.  Negative for recent episodes of shortness of breath, chest  pain.  Negative for nausea, vomiting, diarrhea, constipation.  Negative  for  skin rashes, abrasions or lesions.   PHYSICAL EXAMINATION:  VITAL SIGNS:  Temperature is 97.7.  Pulse is 93.  Respirations are 20.  Blood pressure is 149/83.  GENERAL:  She is a well-nourished, well-developed 70 year old white  female in moderate distress.  HEENT:  She is normocephalic, atraumatic.  Extraocular movements are  intact.  Pupils are equally round and reactive to light and  accommodation.  NECK:  Supple.  CHEST:  Clear.  HEART:  Normal S1, S2 with a regular rate and rhythm without murmurs,  rubs or gallops.  ABDOMEN:  Soft, nontender, nondistended with positive bowel sounds.  EXTREMITIES:  The right leg is shortened and externally rotated with 2+  dorsalis pedis pulses.  Right arm is held in a guarded position with  proximal humerus pain.  She has 2+ radial pulses and a normal sensation.  SKIN:  Warm and dry.  She has no rashes, abrasions or opened fractures.   X-rays of her AP pelvis and lateral right hip do show a displaced  femoral neck fracture.  A proximal humerus fracture is noted on her  shoulder film.   IMPRESSION:  1. Right hip femoral neck fracture.  2. Right proximal humerus fracture.  3. Hypertension.  4. High cholesterol.  5. Fibromyalgia.  6. Depression.   PLAN:  At this point in time, she is being directly admitted to [facility] to undergo a right hip hemiarthroplasty by Dr. Thurston Hole.  Right arm will be placed in a sling for comfort over her proximal  humerus fracture.  Risks, benefits and possible complications of  surgical intervention have been discussed with the patient and her  family.  They are without question.      Kirstin Shepperson, P.A.      Robert A. Thurston Hole, M.D.  Electronically Signed    KS/MEDQ  D:  02/21/2007  T:  02/21/2007  Job:  [id number]

## 2010-12-14 NOTE — Op Note (Signed)
Sandra Craig, Sandra Craig              ACCOUNT NO.:  [id number]   MEDICAL RECORD NO.:  [id number]          PATIENT TYPE:  AMB   LOCATION:  DSC                          FACILITY:  [facility]   PHYSICIAN:  Robert A. Thurston Hole, M.D. 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  Status post right proximal humerus fracture.   POSTOPERATIVE DIAGNOSIS:  1. Right shoulder partial rotator cuff tear with partial labrum tear.  2. Right shoulder adhesive capsulitis.  3. Right shoulder impingement.  4. Status post right proximal humerus fracture.   PROCEDURE:  1. Right shoulder EUA followed by manipulation.  2. Right shoulder arthroscopic lysis of adhesions.  3. Right shoulder debridement partial rotator cuff tear and partial      labrum tear.  4. Right shoulder subacromial decompression.   SURGEON:  Elana Alm. Thurston Hole, M.D.   ASSISTANT:  Julien Girt, P.A.   ANESTHESIA:  General anesthesia.   OPERATIVE TIME:  45 minutes.   COMPLICATIONS:  None.   INDICATIONS FOR PROCEDURE:  The patient is a 70 year old woman who has  sustained a right shoulder proximal humerus fracture in a work related  accident approximately 8 months ago.  She has had persistent pain  despite the fracture healing with examination and MRI documenting  partial versus complete rotator cuff tear with adhesive capsulitis.  She  has failed conservative care and is now to undergo EUA with manipulation  and arthroscopy.   DESCRIPTION OF PROCEDURE:  The patient is brought to the operating room  on October 29, 2007, after an interscalene block was placed in the holding  room by anesthesia.  She was placed on the operating table in the supine  position.  After being placed under general anesthesia, her right  shoulder was examined.  Initial range of motion showed  forward flexion  to 150, abduction to 140, internal rotation of 50, and external rotation  of 60.  A gentle manipulation was carried out breaking up soft  adhesions, improving forward flexion to 170 and abduction to 160,  internal rotation of 70, and external rotation of 85 degrees.  The  shoulder remained stable to ligamentous examination.  She received Ancef  1 gram IV preoperatively for prophylaxis.  After being placed in the  beach chair position, her shoulder and arm was draped using sterile  DuraPrep and draped in usual sterile fashion.  Originally the  arthroscopy was performed through a posterior arthroscopic portal.  The  arthroscope with the pump attached was placed and through an anterior  portal an arthroscopic probe was placed.  On initial inspection the  articular cartilage in the glenohumeral joint was intact.  She had  partial tearing of the anterior, superior, and posterior labrum 25-30%  which was debrided.  The anterior inferior labrum and anterior inferior  glenohumeral ligament complex was intact.  Biceps tendon anchor and  biceps tendon were intact.  There were adhesions around the biceps  tendon and these were  thoroughly released.  Moderate posterior adhesions  were released as well.  The rotator cuff showed partial tearing 25-30%  of the supraspinatus, but it was otherwise well attached and this was  partial debrided.  The rest of the rotator cuff was intact.  Inferior  capsular recess showed moderate erythema consistent with adhesive  capsulitis.  This was partially debrided arthroscopically and small  bleeders cauterized.  Axillary nerve carefully protected.  Subacromial  space was entered and a lateral arthroscopic portal was made.  A large  amount of adhesions and bursitis in the subacromial space was thoroughly  resected.  The rotator cuff on the bursal surface was intact.  Impingement was noted and a subacromial decompression was carried out  revealing 6 mm  of the undersurface of the anterior, anterolateral, and  anterior medial acromion.  CA ligament release carried out as well.  The  [facility] joint was not impinging on motion, was not pathologic, and thus was  not resected.  At this point, his shoulder could be brought through a  satisfactory range of motion with no impingement on the rotator cuff.  At this point it was felt that all pathology had been satisfactorily  addressed.  The instruments were removed.  Portals were closed with 3-0  nylon sutures.  Sterile dressings were applied.  The patient was then  awakened, extubated, and taken to the recovery room in stable condition.  Needle, sponge, and instrument counts correct x2 at the end of the case.  She was taken to the operating room with her arm up over her head and  this will be performed intermittently over the next 24 hours as well as  her supraclavicular block was in place.   FOLLOW-UP CARE:  The patient will be followed overnight for observation  for frequent neurovascular checks and to work on range of motion as her  interscalene block is in effect.  She will discharged tomorrow on  Percocet, Robaxin, and Mobic with early aggressive physical therapy  required.  She will be in a sling for 1-2 weeks.  We will see her back  in the office in a week for sutures out and follow-up.      Robert A. Thurston Hole, M.D.  Electronically Signed     RAW/MEDQ  D:  10/29/2007  T:  10/29/2007  Job:  [id number]   cc:   Worker's Comp carrier

## 2011-01-21 ENCOUNTER — Telehealth: Payer: Self-pay | Admitting: Internal Medicine

## 2011-01-21 MED ORDER — SERTRALINE HCL 100 MG PO TABS: 100.0000 mg | ORAL_TABLET | Freq: Every day | ORAL | Status: DC

## 2011-01-21 NOTE — Telephone Encounter (Signed)
Refill- sertraline hcl 100mg  tab. Take one tablet by mouth daily. Qty 30. Last fill 1.30.12  Pharmacist remark: rx has expired-unused refills remain.

## 2011-04-06 ENCOUNTER — Other Ambulatory Visit: Payer: Self-pay | Admitting: Dermatology

## 2011-04-25 LAB — BASIC METABOLIC PANEL
BUN: 16
CO2: 30
Calcium: 9.1
Chloride: 105
Creatinine, Ser: 1.04
GFR calc Af Amer: >60
GFR calc non Af Amer: 53 — ABNORMAL LOW
Glucose, Bld: 96
Potassium: 4.6
Sodium: 140

## 2011-04-25 LAB — POCT HEMOGLOBIN-HEMACUE: Hemoglobin: 13.1

## 2011-05-11 ENCOUNTER — Other Ambulatory Visit: Payer: Self-pay | Admitting: Internal Medicine

## 2011-05-13 ENCOUNTER — Encounter: Payer: Self-pay | Admitting: Obstetrics and Gynecology

## 2011-05-13 ENCOUNTER — Telehealth: Payer: Self-pay | Admitting: Internal Medicine

## 2011-05-13 MED ORDER — LOSARTAN POTASSIUM 50 MG PO TABS: 50.0000 mg | ORAL_TABLET | Freq: Every day | ORAL | Status: DC

## 2011-05-13 NOTE — Telephone Encounter (Signed)
Pt is on her way out of town & would like her BP meds called into [facility] in [place] [phone].  Pt's cell number [phone]. Sandra Craig

## 2011-05-13 NOTE — Telephone Encounter (Signed)
Pt aware we will fill Losartan for #30 this time only. She will need an appt if further refills are needed from this office. She would not schedule a follow-up today because she was driving  But promised to call back to do so. RX sent.

## 2011-05-16 ENCOUNTER — Telehealth: Payer: Self-pay | Admitting: *Deleted

## 2011-05-16 LAB — CBC
HCT: 23.6 — ABNORMAL LOW
HCT: 23.7 — ABNORMAL LOW
HCT: 24.5 — ABNORMAL LOW
HCT: 25.6 — ABNORMAL LOW
HCT: 28 — ABNORMAL LOW
HCT: 31.9 — ABNORMAL LOW
HCT: 33.1 — ABNORMAL LOW
Hemoglobin: 10.7 — ABNORMAL LOW
Hemoglobin: 11.2 — ABNORMAL LOW
Hemoglobin: 7.9 — CL
Hemoglobin: 8 — ABNORMAL LOW
Hemoglobin: 8.3 — ABNORMAL LOW
Hemoglobin: 8.5 — ABNORMAL LOW
Hemoglobin: 9.3 — ABNORMAL LOW
MCHC: 33.1
MCHC: 33.1
MCHC: 33.3
MCHC: 33.6
MCHC: 33.7
MCHC: 33.8
MCHC: 34
MCV: 86.4
MCV: 86.8
MCV: 87.3
MCV: 87.6
MCV: 88.1
MCV: 88.5
MCV: 89.6
Platelets: 132 — ABNORMAL LOW
Platelets: 164
Platelets: 173
Platelets: 185
Platelets: 194
Platelets: 254
Platelets: 302
RBC: 2.69 — ABNORMAL LOW
RBC: 2.72 — ABNORMAL LOW
RBC: 2.8 — ABNORMAL LOW
RBC: 2.86 — ABNORMAL LOW
RBC: 3.16 — ABNORMAL LOW
RBC: 3.65 — ABNORMAL LOW
RBC: 3.83 — ABNORMAL LOW
RDW: 13.8
RDW: 14
RDW: 14.1 — ABNORMAL HIGH
RDW: 14.1 — ABNORMAL HIGH
RDW: 14.1 — ABNORMAL HIGH
RDW: 14.2 — ABNORMAL HIGH
RDW: 14.3 — ABNORMAL HIGH
WBC: 5.4
WBC: 5.9
WBC: 6
WBC: 6.8
WBC: 7.1
WBC: 7.4
WBC: 9

## 2011-05-16 LAB — POCT I-STAT EG7
Acid-base deficit: 3 — ABNORMAL HIGH
Bicarbonate: 22.2
Calcium, Ion: 1.19
HCT: 29 — ABNORMAL LOW
Hemoglobin: 9.9 — ABNORMAL LOW
O2 Saturation: 64
Operator id: 123971
Patient temperature: 36
Potassium: 4.1
Sodium: 142
TCO2: 23
pCO2, Ven: 39.5 — ABNORMAL LOW
pH, Ven: 7.354 — ABNORMAL HIGH
pO2, Ven: 33

## 2011-05-16 LAB — CROSSMATCH
ABO/RH(D): O NEG
ABO/RH(D): O NEG
Antibody Screen: NEGATIVE
Antibody Screen: NEGATIVE

## 2011-05-16 LAB — BASIC METABOLIC PANEL
BUN: 10
BUN: 7
BUN: 9
BUN: 9
BUN: 9
BUN: 9
CO2: 28
CO2: 30
CO2: 31
CO2: 32
CO2: 33 — ABNORMAL HIGH
CO2: 34 — ABNORMAL HIGH
Calcium: 8.2 — ABNORMAL LOW
Calcium: 8.2 — ABNORMAL LOW
Calcium: 8.3 — ABNORMAL LOW
Calcium: 8.5
Calcium: 8.8
Calcium: 8.9
Chloride: 100
Chloride: 102
Chloride: 104
Chloride: 104
Chloride: 104
Chloride: 104
Creatinine, Ser: 0.81
Creatinine, Ser: 0.89
Creatinine, Ser: 0.95
Creatinine, Ser: 0.98
Creatinine, Ser: 1.03
Creatinine, Ser: 1.04
GFR calc Af Amer: >60
GFR calc Af Amer: >60
GFR calc Af Amer: >60
GFR calc Af Amer: >60
GFR calc Af Amer: >60
GFR calc Af Amer: >60
GFR calc non Af Amer: 53 — ABNORMAL LOW
GFR calc non Af Amer: 54 — ABNORMAL LOW
GFR calc non Af Amer: 57 — ABNORMAL LOW
GFR calc non Af Amer: 59 — ABNORMAL LOW
GFR calc non Af Amer: >60
GFR calc non Af Amer: >60
Glucose, Bld: 103 — ABNORMAL HIGH
Glucose, Bld: 104 — ABNORMAL HIGH
Glucose, Bld: 104 — ABNORMAL HIGH
Glucose, Bld: 113 — ABNORMAL HIGH
Glucose, Bld: 115 — ABNORMAL HIGH
Glucose, Bld: 121 — ABNORMAL HIGH
Potassium: 2.8 — ABNORMAL LOW
Potassium: 3.9
Potassium: 4.3
Potassium: 4.3
Potassium: 4.5
Potassium: 4.7
Sodium: 137
Sodium: 139
Sodium: 139
Sodium: 139
Sodium: 141
Sodium: 142

## 2011-05-16 LAB — URINE MICROSCOPIC-ADD ON

## 2011-05-16 LAB — COMPREHENSIVE METABOLIC PANEL
ALT: 13
AST: 19
Albumin: 3.3 — ABNORMAL LOW
Alkaline Phosphatase: 52
BUN: 17
CO2: 28
Calcium: 8.7
Chloride: 105
Creatinine, Ser: 1.09
GFR calc Af Amer: >60
GFR calc non Af Amer: 51 — ABNORMAL LOW
Glucose, Bld: 100 — ABNORMAL HIGH
Potassium: 3.8
Sodium: 140
Total Bilirubin: 0.9
Total Protein: 6.2

## 2011-05-16 LAB — PROTIME-INR
INR: 1.1
INR: 1.2
INR: 1.5
INR: 1.5
INR: 1.7 — ABNORMAL HIGH
INR: 2.3 — ABNORMAL HIGH
INR: 2.4 — ABNORMAL HIGH
INR: 2.5 — ABNORMAL HIGH
Prothrombin Time: 14.4
Prothrombin Time: 15
Prothrombin Time: 18.3 — ABNORMAL HIGH
Prothrombin Time: 18.4 — ABNORMAL HIGH
Prothrombin Time: 21 — ABNORMAL HIGH
Prothrombin Time: 26.3 — ABNORMAL HIGH
Prothrombin Time: 27 — ABNORMAL HIGH
Prothrombin Time: 28 — ABNORMAL HIGH

## 2011-05-16 LAB — URINALYSIS, ROUTINE W REFLEX MICROSCOPIC
Bilirubin Urine: NEGATIVE
Glucose, UA: NEGATIVE
Ketones, ur: >80 — AB
Leukocytes, UA: NEGATIVE
Nitrite: NEGATIVE
Protein, ur: NEGATIVE
Specific Gravity, Urine: 1.018
Urobilinogen, UA: 0.2
pH: 5.5

## 2011-05-16 LAB — URINE CULTURE
Colony Count: NO GROWTH
Culture: NO GROWTH
Special Requests: NEGATIVE

## 2011-05-16 LAB — ABO/RH: ABO/RH(D): O NEG

## 2011-05-16 LAB — APTT: aPTT: 36

## 2011-05-16 NOTE — Telephone Encounter (Signed)
Left message - Sandra Craig name and number was given for counseling. Call if further questions.

## 2011-05-16 NOTE — Telephone Encounter (Signed)
Patient said if you recall she lost her husband about 18 months ago.  She has found another companion and she was wanting your recommendation on a marriage counselor with discussion on sexual relations for them.  She said her annual is coming up, you guys can talk then or if you had someone in mind we could tell her now. Thanks

## 2011-05-20 DIAGNOSIS — N3010 Interstitial cystitis (chronic) without hematuria: Secondary | ICD-10-CM | POA: Insufficient documentation

## 2011-05-20 DIAGNOSIS — E785 Hyperlipidemia, unspecified: Secondary | ICD-10-CM | POA: Insufficient documentation

## 2011-05-20 DIAGNOSIS — K589 Irritable bowel syndrome without diarrhea: Secondary | ICD-10-CM | POA: Insufficient documentation

## 2011-05-20 DIAGNOSIS — I10 Essential (primary) hypertension: Secondary | ICD-10-CM | POA: Insufficient documentation

## 2011-05-20 DIAGNOSIS — N809 Endometriosis, unspecified: Secondary | ICD-10-CM | POA: Insufficient documentation

## 2011-05-26 ENCOUNTER — Ambulatory Visit (INDEPENDENT_AMBULATORY_CARE_PROVIDER_SITE_OTHER): Payer: Medicare Other | Admitting: Women's Health

## 2011-05-26 ENCOUNTER — Encounter: Payer: Self-pay | Admitting: Women's Health

## 2011-05-26 VITALS — BP 120/70 | Ht 66.0 in | Wt 149.0 lb

## 2011-05-26 DIAGNOSIS — N952 Postmenopausal atrophic vaginitis: Secondary | ICD-10-CM

## 2011-05-26 DIAGNOSIS — M810 Age-related osteoporosis without current pathological fracture: Secondary | ICD-10-CM

## 2011-05-26 DIAGNOSIS — Q613 Polycystic kidney, unspecified: Secondary | ICD-10-CM | POA: Insufficient documentation

## 2011-05-26 NOTE — Progress Notes (Addendum)
Sandra Craig March 28, 1941 [id number]    History:    The patient presents for discussion about DEXA, numerous changes that have occurred this past year. Has been a 30 years died a year and a half ago, father died last year, which off about a year ago, and is now dating a boyfriend prior to her marriage.   Past medical history, past surgical history, family history and social history were all reviewed and documented in the EPIC chart.   ROS:  A  ROS was performed and pertinent positives and negatives are included in the history.  Exam:  Filed Vitals:   05/26/11 1044  BP: 120/70    General appearance:  Normal Head/Neck:  Normal, without cervical or supraclavicular adenopathy. Thyroid:  Symmetrical, normal in size, without palpable masses or nodularity. Respiratory  Effort:  Normal  Auscultation:  Clear without wheezing or rhonchi Cardiovascular  Auscultation:  Regular rate, without rubs, murmurs or gallops  Edema/varicosities:  Not grossly evident Abdominal  Soft,nontender, without masses, guarding or rebound.  Liver/spleen:  No organomegaly noted  Hernia:  None appreciated  Skin  Inspection:  Grossly normal  Palpation:  Grossly normal Neurologic/psychiatric  Orientation:  Normal with appropriate conversation.  Mood/affect:  Normal  Genitourinary    Breasts: Examined lying and sitting.     Right: Without masses, retractions, discharge or axillary adenopathy.     Left: Without masses, retractions, discharge or axillary adenopathy.   Inguinal/mons:  Normal without inguinal adenopathy  External genitalia:  Normal  BUS/Urethra/Skene's glands:  Normal  Bladder:  Normal  Vagina:  Normal  Cervix:   Uterus:  Adnexa/parametria:     Rt: Without masses or tenderness.   Lt: Without masses or tenderness.  Anus and perineum: Normal  Digital rectal exam: Normal sphincter tone without palpated masses or tenderness  Assessment/Plan:  70 y.o. WWF G1P0 for vaginal dryness,  osteoporosis. Hysterectomy in 89 for endometriosis. Had a small intestine blockage in November of 2011.  Had a negative colonoscopy 3 years ago, does see her primary care for cholesterol,  Hypertension, osteoporosis medication. She has been on reclast and prolia for her osteoporosis with her primary care.  (Reclast x1 dose, Prolia x2 doses)  Had a normal mammogram last week. Has had both zostovac and Pneumovax vaccine.  History of a hip and shoulder fracture in 08 from a fall.  Postmenopausal no ERT Osteoporosis/medications managed primary care Increased cholesterol/hypertension-meds or labs at primary care  Plan: DEXA will schedule, continue [facility] annual mammogram, fall prevention and home safety reviewed. Calcium rich diet and vitamin D encouraged. Encouraged increasing exercise, counseling has had many changes that occurred in the last 2 years. She is beginning to date, encouraged vaginal lubricants and condoms if becomes sexually active. Had DEXA here in the past, will schedule. Flu vaccine today   Harrington Challenger [facility], 11:53 AM 05/26/2011

## 2011-06-01 ENCOUNTER — Telehealth: Payer: Self-pay | Admitting: Internal Medicine

## 2011-06-01 NOTE — Telephone Encounter (Signed)
Ok with me 

## 2011-06-01 NOTE — Telephone Encounter (Signed)
Waiting on response from Dr Yoo.  °

## 2011-06-01 NOTE — Telephone Encounter (Signed)
Pt is req to change pcps from Dr Jonny Ruiz, back to Dr Artist Pais at [facility]. Pls advise if ok?

## 2011-06-02 NOTE — Telephone Encounter (Signed)
Called and lft vm for pt, notifying them that both doctors have agreed to change of pcp. Waiting on call back.

## 2011-06-02 NOTE — Telephone Encounter (Signed)
Pt has been notified that both doctors have approved pcp change from Dr Jonny Ruiz to Dr Artist Pais. Pt has been sch a 30 ov to re-est with Dr Artist Pais on 11/19 at 2:30 pm.

## 2011-06-06 ENCOUNTER — Telehealth: Payer: Self-pay | Admitting: Internal Medicine

## 2011-06-06 NOTE — Telephone Encounter (Signed)
Pt is sch a re-est ov with Dr Artist Pais on 06/20/11. Pt is needing to get a Prolia Injection during this ov. Pls advise if ok? Also pt is going to run out of Losartan Sodium 50 mg on 06/17/11. Pts wants to know if she'll be ok without med from 11/16 until 11/19 when she comes in for ov?

## 2011-06-06 NOTE — Telephone Encounter (Signed)
Please make sure that we can provide prolia injection during her visit. Also okay to refill losartan  x1 before her next office visit

## 2011-06-07 MED ORDER — LOSARTAN POTASSIUM 50 MG PO TABS: 50.0000 mg | ORAL_TABLET | Freq: Every day | ORAL | Status: DC

## 2011-06-07 NOTE — Telephone Encounter (Signed)
rx sent in, we have prolia in stock, pt aware

## 2011-06-20 ENCOUNTER — Encounter: Payer: Self-pay | Admitting: Internal Medicine

## 2011-06-20 ENCOUNTER — Ambulatory Visit (INDEPENDENT_AMBULATORY_CARE_PROVIDER_SITE_OTHER): Payer: Medicare Other | Admitting: Internal Medicine

## 2011-06-20 DIAGNOSIS — I10 Essential (primary) hypertension: Secondary | ICD-10-CM

## 2011-06-20 DIAGNOSIS — E782 Mixed hyperlipidemia: Secondary | ICD-10-CM

## 2011-06-20 DIAGNOSIS — F3289 Other specified depressive episodes: Secondary | ICD-10-CM

## 2011-06-20 DIAGNOSIS — R1310 Dysphagia, unspecified: Secondary | ICD-10-CM | POA: Insufficient documentation

## 2011-06-20 DIAGNOSIS — IMO0001 Reserved for inherently not codable concepts without codable children: Secondary | ICD-10-CM

## 2011-06-20 DIAGNOSIS — M810 Age-related osteoporosis without current pathological fracture: Secondary | ICD-10-CM

## 2011-06-20 DIAGNOSIS — F329 Major depressive disorder, single episode, unspecified: Secondary | ICD-10-CM

## 2011-06-20 MED ORDER — ROSUVASTATIN CALCIUM 20 MG PO TABS: 20.0000 mg | ORAL_TABLET | Freq: Every day | ORAL | Status: DC

## 2011-06-20 MED ORDER — GABAPENTIN 100 MG PO CAPS: 100.0000 mg | ORAL_CAPSULE | Freq: Every day | ORAL | Status: DC

## 2011-06-20 MED ORDER — POLYETHYLENE GLYCOL 3350 17 G PO PACK: 17.0000 g | PACK | Freq: Every day | ORAL | Status: AC

## 2011-06-20 MED ORDER — ZOLPIDEM TARTRATE 10 MG PO TABS: 10.0000 mg | ORAL_TABLET | Freq: Every evening | ORAL | Status: DC | PRN

## 2011-06-20 MED ORDER — ESOMEPRAZOLE MAGNESIUM 40 MG PO CPDR: 40.0000 mg | DELAYED_RELEASE_CAPSULE | Freq: Every day | ORAL | Status: DC

## 2011-06-20 MED ORDER — SERTRALINE HCL 100 MG PO TABS: 100.0000 mg | ORAL_TABLET | Freq: Every day | ORAL | Status: DC

## 2011-06-20 MED ORDER — DENOSUMAB 60 MG/ML ~~LOC~~ SOLN
60.0000 mg | Freq: Once | SUBCUTANEOUS | Status: AC
  Administered 2011-06-20: 60 mg via SUBCUTANEOUS

## 2011-06-20 MED ORDER — LOSARTAN POTASSIUM 50 MG PO TABS: 50.0000 mg | ORAL_TABLET | Freq: Every day | ORAL | Status: DC

## 2011-06-20 NOTE — Assessment & Plan Note (Signed)
Liver enzymes on 04/22/2011 were normal AST 20, ALT 21 Continue Crestor 10 mg once daily Arrange fasting lipid panel in December.

## 2011-06-20 NOTE — Patient Instructions (Signed)
Please complete the following lab tests within 1 month. Lipid panel:  272.4

## 2011-06-20 NOTE — Progress Notes (Signed)
Subjective:    Patient ID: Sandra Craig, female    DOB: 1940/09/21, 70 y.o.   MRN: [id number]  HPI  70 year old white female with history of hypertension, polycystic kidney disease and osteoporosis for followup. Overall patient has been doing very well. She is followed by nephrologist Dr. Hyman Hopes. Her most recent blood tests were on 04/22/2011 and creatinine stable at 1.35.  Interval history-patient was hospitalized in November of 2011 with abdominal pain and was diagnosed with small bowel obstruction. She underwent laparotomy with lysis of adhesions by Dr. Zachery Dakins.  Despite taking Nexium daily patient has persistent heartburn symptoms. Patient also complains of pill dysphagia.  Fibromyalgia - stable.  Sleep quality ok as long as she takes [place] and gabapentin.  Review of Systems Negative for weight loss,  Negative for chest pain  Past Medical History  Diagnosis Date  . IBS (irritable bowel syndrome)   . Hypertension   . Hyperlipidemia   . IC (interstitial cystitis)   . Endometriosis   . Polycystic kidney disease     History   Social History  . Marital Status: Married    Spouse Name: N/A    Number of Children: N/A  . Years of Education: N/A   Occupational History  . Not on file.   Social History Main Topics  . Smoking status: Never Smoker   . Smokeless tobacco: Never Used  . Alcohol Use: No  . Drug Use: No  . Sexually Active: No   Other Topics Concern  . Not on file   Social History Narrative  . No narrative on file    Past Surgical History  Procedure Date  . Cholecystectomy   . Pelvic laparoscopy   . Abdominal hysterectomy 1994    TAH,BSO FOR ENDOMETRIOSIS  . Oophorectomy 1994    TAH,BSO  . Total hip arthroplasty FALL OF 2008    rt. partial hip replacement  . Abdominal surgery 2011    small intestine blockage    Family History  Problem Relation Age of Onset  . Heart disease Mother   . Lymphoma Maternal Grandmother   . Cancer Paternal  Grandmother     COLON    Allergies  Allergen Reactions  . Celecoxib   . Hydrocodone   . Prednisone Other (See Comments)    Made pt feel "crazy"  . Sulfonamide Derivatives     Current Outpatient Prescriptions on File Prior to Visit  Medication Sig Dispense Refill  . Calcium Carbonate-Vitamin D (CALCIUM + D PO) Take by mouth. 1200 OF CALCIUM +D/TAKES 2 DAILY       . Multiple Vitamin (MULTIVITAMIN) capsule Take 1 capsule by mouth daily.         No current facility-administered medications on file prior to visit.    BP 122/82  Pulse 84  Temp(Src) 98.9 F (37.2 C) (Oral)  Ht 5\' 6"  (1.676 m)  Wt 148 lb (67.132 kg)  BMI 23.89 kg/m2  LMP 08/25/1992       Objective:   Physical Exam  Constitutional: She appears well-developed and well-nourished.  HENT:  Head: Normocephalic and atraumatic.  Eyes: EOM are normal. Pupils are equal, round, and reactive to light.  Neck: Normal range of motion. Neck supple.       No carotid bruit  Cardiovascular: Normal rate, regular rhythm and normal heart sounds.   No murmur heard. Pulmonary/Chest: Effort normal and breath sounds normal.  Abdominal: Soft. Bowel sounds are normal.  Musculoskeletal: She exhibits no edema.  Lymphadenopathy:  She has no cervical adenopathy.  Neurological: She is alert.  Skin: Skin is warm and dry.  Psychiatric: She has a normal mood and affect. Her behavior is normal.       Assessment & Plan:

## 2011-06-20 NOTE — Assessment & Plan Note (Signed)
Unchanged.  I suggest pt stay on gabapentin and zolpidem to maintain sleep quality.

## 2011-06-20 NOTE — Assessment & Plan Note (Signed)
Continue prolia injections q 6 months.  Pt not candidate for oral bisphosphonates due to dysphasia and GERD.

## 2011-06-20 NOTE — Assessment & Plan Note (Signed)
Patient's blood pressure is stable. Her creatinine is closely monitored by nephrologist. Last creatinine 1.35 04/22/2011. BP: 122/82 mmHg

## 2011-06-20 NOTE — Assessment & Plan Note (Signed)
Stable.  Continue current dose of sertraline.

## 2011-06-20 NOTE — Assessment & Plan Note (Signed)
70 year old female with history of GERD reports worsening symptoms and pill dysphagia. Refer to Dr. Juanda Chance for possible EGD.

## 2011-06-22 ENCOUNTER — Encounter: Payer: Self-pay | Admitting: Obstetrics and Gynecology

## 2011-06-27 ENCOUNTER — Encounter: Payer: Self-pay | Admitting: *Deleted

## 2011-06-28 ENCOUNTER — Ambulatory Visit (INDEPENDENT_AMBULATORY_CARE_PROVIDER_SITE_OTHER): Payer: Medicare Other | Admitting: Gastroenterology

## 2011-06-28 ENCOUNTER — Encounter: Payer: Self-pay | Admitting: Gastroenterology

## 2011-06-28 DIAGNOSIS — K589 Irritable bowel syndrome without diarrhea: Secondary | ICD-10-CM | POA: Insufficient documentation

## 2011-06-28 DIAGNOSIS — K5730 Diverticulosis of large intestine without perforation or abscess without bleeding: Secondary | ICD-10-CM

## 2011-06-28 DIAGNOSIS — K222 Esophageal obstruction: Secondary | ICD-10-CM

## 2011-06-28 DIAGNOSIS — Z8719 Personal history of other diseases of the digestive system: Secondary | ICD-10-CM

## 2011-06-28 DIAGNOSIS — R1310 Dysphagia, unspecified: Secondary | ICD-10-CM

## 2011-06-28 DIAGNOSIS — K219 Gastro-esophageal reflux disease without esophagitis: Secondary | ICD-10-CM | POA: Insufficient documentation

## 2011-06-28 NOTE — Patient Instructions (Signed)
Your procedure has been scheduled for 07/04/2011, please follow the seperate instructions.  Samples of Nexium given today make sure you take the Nexium everyday once a day. Propofol sedation handout given.

## 2011-06-28 NOTE — Progress Notes (Signed)
This is a light flow 70 year old Caucasian female hospitalized one year ago with small bowel obstruction requiring laparotomy with release of intestinal adhesions by Dr. Consuello Bossier. Patient currently is asymptomatic except for rather marked acid reflux symptoms and progressive solid food dysphagia in her distal substernal area. She has been on Nexium for one week with mild improvement. She has chronic constipation managed with daily MiraLax as recommended by Dr. Zachery Dakins. The patient has a long history of symptomatic diverticulosis with a very long and redundant colon. Last colonoscopy was 3 years ago. Otherwise the patient denies any general medical or GI or hepatobiliary problems. Last endoscopy was also 3 years ago.  Current Medications, Allergies, Past Medical History, Past Surgical History, Family History and Social History were reviewed in [facility] record.  Pertinent Review of Systems Negative   Physical Exam: Awake and alert in no distress. She appears much younger than her stated age. Cannot appreciate stigmata of chronic liver disease. Chest is clear and cardiac exam is unremarkable. I cannot appreciate abdominal distention, organomegaly, masses or tenderness. Bowel sounds are normal. Mental status is normal.  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 I've scheduled followup endoscopy with esophageal dilatation. We have urged daily PPI therapy with standard antireflux maneuvers. He is otherwise take her medications as listed reviewed her record including daily MiraLax. After endoscopy we will do IFOB stool card exams and determine if she needs followup colonoscopy. She has had no further bowel obstruction problem since her surgery. Please copy Dr. Consuello Bossier and Dr. Alwyn Ren Encounter Diagnosis  Name Primary?  Marland Kitchen Dysphagia Yes

## 2011-07-04 ENCOUNTER — Other Ambulatory Visit: Payer: Medicare Other | Admitting: Gastroenterology

## 2011-07-08 ENCOUNTER — Other Ambulatory Visit: Payer: Medicare Other | Admitting: Gastroenterology

## 2011-08-04 DIAGNOSIS — F39 Unspecified mood [affective] disorder: Secondary | ICD-10-CM | POA: Diagnosis not present

## 2011-08-19 ENCOUNTER — Other Ambulatory Visit: Payer: Medicare Other

## 2011-08-24 ENCOUNTER — Other Ambulatory Visit (INDEPENDENT_AMBULATORY_CARE_PROVIDER_SITE_OTHER): Payer: Medicare Other

## 2011-08-24 ENCOUNTER — Encounter: Payer: Self-pay | Admitting: Internal Medicine

## 2011-08-24 DIAGNOSIS — E785 Hyperlipidemia, unspecified: Secondary | ICD-10-CM | POA: Diagnosis not present

## 2011-08-24 LAB — LIPID PANEL
Cholesterol: 129 mg/dL (ref 0–200)
HDL: 58.1 mg/dL (ref >39.00)
LDL Cholesterol: 60 mg/dL (ref 0–99)
Total CHOL/HDL Ratio: 2
Triglycerides: 56 mg/dL (ref 0.0–149.0)
VLDL: 11.2 mg/dL (ref 0.0–40.0)

## 2011-08-30 ENCOUNTER — Ambulatory Visit (INDEPENDENT_AMBULATORY_CARE_PROVIDER_SITE_OTHER): Payer: Medicare Other

## 2011-08-30 ENCOUNTER — Other Ambulatory Visit: Payer: Self-pay | Admitting: Obstetrics and Gynecology

## 2011-08-30 DIAGNOSIS — M899 Disorder of bone, unspecified: Secondary | ICD-10-CM | POA: Diagnosis not present

## 2011-08-30 DIAGNOSIS — M949 Disorder of cartilage, unspecified: Secondary | ICD-10-CM

## 2011-09-14 ENCOUNTER — Telehealth: Payer: Self-pay | Admitting: Women's Health

## 2011-09-14 NOTE — Telephone Encounter (Signed)
Telephone call to discuss DEXA that was performed on 08/30/2011 at our office. Has had one dose of reclast, 2 doses of Prolia at her primary care doctor Thomos Lemons at [facility] at [place]. Reviewed will fax report to his office and continue care/medications there. Reviewed importance of home safety and fall prevention. Did review the results show stability and some improvement in bone density.

## 2011-10-10 ENCOUNTER — Other Ambulatory Visit: Payer: Self-pay | Admitting: *Deleted

## 2011-10-10 MED ORDER — SERTRALINE HCL 100 MG PO TABS: 100.0000 mg | ORAL_TABLET | Freq: Every day | ORAL | Status: DC

## 2011-10-10 MED ORDER — GABAPENTIN 100 MG PO CAPS: 100.0000 mg | ORAL_CAPSULE | Freq: Every day | ORAL | Status: DC

## 2011-10-18 ENCOUNTER — Telehealth: Payer: Self-pay | Admitting: Family Medicine

## 2011-10-18 NOTE — Telephone Encounter (Signed)
Pt had been getting her Miralax at [facility]. She takes it for Hx of bowel blockages. 527g powder. She now needs to have this going through [facility]. Can we please send it there, but it MUST be specified that it is the 527g powder. Thanks! Also, please do for a 90-day supply.

## 2011-10-19 MED ORDER — POLYETHYLENE GLYCOL 3350 17 G PO PACK: 17.0000 g | PACK | Freq: Every day | ORAL | Status: DC

## 2011-10-19 NOTE — Telephone Encounter (Signed)
Ok for 3 month supply to mail order with 3 refills

## 2011-10-19 NOTE — Telephone Encounter (Signed)
rx sent in electronically 

## 2011-10-26 DIAGNOSIS — H01009 Unspecified blepharitis unspecified eye, unspecified eyelid: Secondary | ICD-10-CM | POA: Diagnosis not present

## 2011-11-23 DIAGNOSIS — F39 Unspecified mood [affective] disorder: Secondary | ICD-10-CM | POA: Diagnosis not present

## 2011-12-07 DIAGNOSIS — F39 Unspecified mood [affective] disorder: Secondary | ICD-10-CM | POA: Diagnosis not present

## 2011-12-08 DIAGNOSIS — M67919 Unspecified disorder of synovium and tendon, unspecified shoulder: Secondary | ICD-10-CM | POA: Diagnosis not present

## 2011-12-08 DIAGNOSIS — M19019 Primary osteoarthritis, unspecified shoulder: Secondary | ICD-10-CM | POA: Diagnosis not present

## 2011-12-08 DIAGNOSIS — M719 Bursopathy, unspecified: Secondary | ICD-10-CM | POA: Diagnosis not present

## 2011-12-16 ENCOUNTER — Ambulatory Visit: Payer: Medicare Other | Admitting: Internal Medicine

## 2011-12-28 ENCOUNTER — Encounter: Payer: Self-pay | Admitting: Internal Medicine

## 2011-12-28 ENCOUNTER — Ambulatory Visit (INDEPENDENT_AMBULATORY_CARE_PROVIDER_SITE_OTHER): Payer: Medicare Other | Admitting: Internal Medicine

## 2011-12-28 VITALS — BP 128/76 | HR 68 | Temp 98.2°F | Ht 66.0 in | Wt 143.0 lb

## 2011-12-28 DIAGNOSIS — I10 Essential (primary) hypertension: Secondary | ICD-10-CM

## 2011-12-28 DIAGNOSIS — D696 Thrombocytopenia, unspecified: Secondary | ICD-10-CM | POA: Diagnosis not present

## 2011-12-28 DIAGNOSIS — E782 Mixed hyperlipidemia: Secondary | ICD-10-CM | POA: Diagnosis not present

## 2011-12-28 DIAGNOSIS — M899 Disorder of bone, unspecified: Secondary | ICD-10-CM

## 2011-12-28 DIAGNOSIS — K219 Gastro-esophageal reflux disease without esophagitis: Secondary | ICD-10-CM

## 2011-12-28 DIAGNOSIS — K589 Irritable bowel syndrome without diarrhea: Secondary | ICD-10-CM

## 2011-12-28 DIAGNOSIS — M949 Disorder of cartilage, unspecified: Secondary | ICD-10-CM | POA: Diagnosis not present

## 2011-12-28 LAB — CBC WITH DIFFERENTIAL/PLATELET
Basophils Absolute: 0 10*3/uL (ref 0.0–0.1)
Basophils Relative: 0.6 % (ref 0.0–3.0)
Eosinophils Absolute: 0.1 10*3/uL (ref 0.0–0.7)
Eosinophils Relative: 1.7 % (ref 0.0–5.0)
HCT: 36.8 % (ref 36.0–46.0)
Hemoglobin: 11.9 g/dL — ABNORMAL LOW (ref 12.0–15.0)
Lymphocytes Relative: 34.9 % (ref 12.0–46.0)
Lymphs Abs: 1.9 10*3/uL (ref 0.7–4.0)
MCHC: 32.4 g/dL (ref 30.0–36.0)
MCV: 83.8 fl (ref 78.0–100.0)
Monocytes Absolute: 0.3 10*3/uL (ref 0.1–1.0)
Monocytes Relative: 6.2 % (ref 3.0–12.0)
Neutro Abs: 3.2 10*3/uL (ref 1.4–7.7)
Neutrophils Relative %: 56.6 % (ref 43.0–77.0)
Platelets: 179 10*3/uL (ref 150.0–400.0)
RBC: 4.39 Mil/uL (ref 3.87–5.11)
RDW: 16.2 % — ABNORMAL HIGH (ref 11.5–14.6)
WBC: 5.6 10*3/uL (ref 4.5–10.5)

## 2011-12-28 LAB — BASIC METABOLIC PANEL
BUN: 22 mg/dL (ref 6–23)
CO2: 30 mEq/L (ref 19–32)
Calcium: 9.9 mg/dL (ref 8.4–10.5)
Chloride: 106 mEq/L (ref 96–112)
Creatinine, Ser: 1.3 mg/dL — ABNORMAL HIGH (ref 0.4–1.2)
GFR: 41.6 mL/min — ABNORMAL LOW (ref >60.00)
Glucose, Bld: 87 mg/dL (ref 70–99)
Potassium: 4.5 mEq/L (ref 3.5–5.1)
Sodium: 143 mEq/L (ref 135–145)

## 2011-12-28 LAB — HEPATIC FUNCTION PANEL
ALT: 26 U/L (ref 0–35)
AST: 25 U/L (ref 0–37)
Albumin: 4.1 g/dL (ref 3.5–5.2)
Alkaline Phosphatase: 52 U/L (ref 39–117)
Bilirubin, Direct: 0 mg/dL (ref 0.0–0.3)
Total Bilirubin: 0.5 mg/dL (ref 0.3–1.2)
Total Protein: 7.1 g/dL (ref 6.0–8.3)

## 2011-12-28 LAB — TSH: TSH: 1.46 u[IU]/mL (ref 0.35–5.50)

## 2011-12-28 MED ORDER — LOSARTAN POTASSIUM 50 MG PO TABS: 50.0000 mg | ORAL_TABLET | Freq: Every day | ORAL | Status: DC

## 2011-12-28 MED ORDER — ROSUVASTATIN CALCIUM 20 MG PO TABS: 20.0000 mg | ORAL_TABLET | Freq: Every day | ORAL | Status: DC

## 2011-12-28 NOTE — Assessment & Plan Note (Signed)
Recent DEXA scan was performed by her GYN. No significant change. Patient advised to continue taking her calcium and vitamin D supplement. Check vitamin D level.

## 2011-12-28 NOTE — Assessment & Plan Note (Signed)
Patient reports rare reflux symptoms. She discontinued Nexium. Patient advised to use over-the-counter ranitidine 150 mg twice a day as needed.

## 2011-12-28 NOTE — Assessment & Plan Note (Signed)
Well controlled.  Monitor electrolytes and kidney function. 

## 2011-12-28 NOTE — Assessment & Plan Note (Signed)
Monitor LFTs 

## 2011-12-28 NOTE — Assessment & Plan Note (Signed)
Continue MiraLax. Patient was also advised to use psyllium fiber supplement daily.

## 2011-12-28 NOTE — Progress Notes (Signed)
Subjective:    Patient ID: Sandra Craig, female    DOB: [birth year], 71 y.o.   MRN: [id number]  HPI  71 year old white female with history of hyperlipidemia, hypertension and IBS for routine followup. She denies significant interval medical history. Overall she's been doing well. She discontinued Nexium. She has rare reflux symptoms. She has been having issues with recurrent right shoulder pain secondary to previous fall. 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  DEXA scan completed by her GYN was reviewed. She has osteopenia. Her gynecologist recommended continuing her calcium and vitamin D supplement.  She still has issues with getting to sleep. No improvement since retiring.  Review of Systems Negative for chest pain or cough  Past Medical History  Diagnosis Date  . IBS (irritable bowel syndrome)   . Hypertension   . Hyperlipidemia   . IC (interstitial cystitis)   . Endometriosis   . Polycystic kidney disease   . Osteopenia   . Cystitis   . Fibromyalgia   . Palpitations   . Chronic insomnia   . Diverticulosis   . Pulmonary nodule 12/08    5 mm Anterior RUL  . Hiatal hernia   . Diverticulosis of colon (without mention of hemorrhage)   . Internal hemorrhoid   . Family history of malignant neoplasm of gastrointestinal tract     History   Social History  . Marital Status: Widowed    Spouse Name: N/A    Number of Children: N/A  . Years of Education: N/A   Occupational History  . Retired     [employer]   Social History Main Topics  . Smoking status: Former Games developer  . Smokeless tobacco: Never Used   Comment: Quit in 1977  . Alcohol Use: No  . Drug Use: No  . Sexually Active: No   Other Topics Concern  . Not on file   Social History Narrative  . No narrative on file    Past Surgical History  Procedure Date  . Cholecystectomy   . Pelvic laparoscopy   . Abdominal hysterectomy 1994    TAH,BSO FOR  ENDOMETRIOSIS  . Oophorectomy 1994    TAH,BSO  . Total hip arthroplasty FALL OF 2008    rt. partial hip replacement  . Abdominal surgery 2011    small intestine blockage  . S/p right shoulder rotater cuff 200216/2011    Tear/adhesive capsulitis  . Sbo lap 11    Adhesions and small internal hernia    Family History  Problem Relation Age of Onset  . Heart disease Mother     MI at age 46  . Lymphoma Maternal Grandmother   . Colon cancer Paternal Grandmother   . COPD Father   . Emphysema Mother   . Thyroid disease Mother     Thyroidectomy/Benign    Allergies  Allergen Reactions  . Celecoxib   . Hydrocodone   . Prednisone Other (See Comments)    Made pt feel "crazy"  . Sulfonamide Derivatives     Current Outpatient Prescriptions on File Prior to Visit  Medication Sig Dispense Refill  . Calcium Carbonate-Vitamin D (CALCIUM + D PO) Take by mouth. 1200 OF CALCIUM +D/TAKES 2 DAILY      . esomeprazole (NEXIUM) 40 MG capsule Take 1 capsule (40 mg total) by mouth daily before breakfast.  90 capsule  1  . gabapentin (NEURONTIN) 100 MG capsule Take 1 capsule (100 mg total) by mouth daily.  90 capsule  1  .  Multiple Vitamin (MULTIVITAMIN) capsule Take 1 capsule by mouth daily.        . polyethylene glycol (MIRALAX / GLYCOLAX) packet Take 17 g by mouth daily.  100 each  3  . sertraline (ZOLOFT) 100 MG tablet Take 1 tablet (100 mg total) by mouth daily.  90 tablet  1  . zolpidem (AMBIEN) 10 MG tablet Take 1 tablet (10 mg total) by mouth at bedtime as needed.  90 tablet  1  . DISCONTD: losartan (COZAAR) 50 MG tablet Take 1 tablet (50 mg total) by mouth daily.  90 tablet  1  . DISCONTD: rosuvastatin (CRESTOR) 20 MG tablet Take 1 tablet (20 mg total) by mouth daily.  90 tablet  1    BP 128/76  Pulse 68  Temp(Src) 98.2 F (36.8 C) (Oral)  Ht 5\' 6"  (1.676 m)  Wt 143 lb (64.864 kg)  BMI 23.08 kg/m2  LMP 08/25/1992       Objective:   Physical Exam  Constitutional: She is oriented  to person, place, and time. She appears well-developed and well-nourished.  Neck: Neck supple.       No carotid bruit  Cardiovascular: Normal rate, regular rhythm and normal heart sounds.   Musculoskeletal: She exhibits no edema.  Neurological: She is alert and oriented to person, place, and time. No cranial nerve deficit.  Psychiatric: She has a normal mood and affect. Her behavior is normal.          Assessment & Plan:

## 2011-12-29 LAB — VITAMIN D 25 HYDROXY (VIT D DEFICIENCY, FRACTURES): Vit D, 25-Hydroxy: 39 ng/mL (ref 30–89)

## 2012-01-03 ENCOUNTER — Encounter: Payer: Self-pay | Admitting: Internal Medicine

## 2012-01-04 DIAGNOSIS — F39 Unspecified mood [affective] disorder: Secondary | ICD-10-CM | POA: Diagnosis not present

## 2012-01-16 DIAGNOSIS — M19019 Primary osteoarthritis, unspecified shoulder: Secondary | ICD-10-CM | POA: Diagnosis not present

## 2012-01-23 DIAGNOSIS — S92309A Fracture of unspecified metatarsal bone(s), unspecified foot, initial encounter for closed fracture: Secondary | ICD-10-CM | POA: Diagnosis not present

## 2012-01-23 DIAGNOSIS — M19019 Primary osteoarthritis, unspecified shoulder: Secondary | ICD-10-CM | POA: Diagnosis not present

## 2012-01-23 DIAGNOSIS — Q740 Other congenital malformations of upper limb(s), including shoulder girdle: Secondary | ICD-10-CM | POA: Diagnosis not present

## 2012-02-06 DIAGNOSIS — Q740 Other congenital malformations of upper limb(s), including shoulder girdle: Secondary | ICD-10-CM | POA: Diagnosis not present

## 2012-02-07 ENCOUNTER — Ambulatory Visit
Admission: RE | Admit: 2012-02-07 | Discharge: 2012-02-07 | Disposition: A | Discharge status: Home / Self Care | Payer: Medicare Other | Source: Ambulatory Visit | Attending: Orthopedic Surgery | Admitting: Orthopedic Surgery

## 2012-02-07 ENCOUNTER — Other Ambulatory Visit: Payer: Self-pay | Admitting: Orthopedic Surgery

## 2012-02-07 DIAGNOSIS — R911 Solitary pulmonary nodule: Secondary | ICD-10-CM

## 2012-02-07 DIAGNOSIS — R918 Other nonspecific abnormal finding of lung field: Secondary | ICD-10-CM | POA: Diagnosis not present

## 2012-02-07 MED ORDER — IOHEXOL 300 MG/ML  SOLN
75.0000 mL | Freq: Once | INTRAMUSCULAR | Status: AC | PRN
  Administered 2012-02-07: 75 mL via INTRAVENOUS

## 2012-02-08 ENCOUNTER — Other Ambulatory Visit: Payer: Medicare Other

## 2012-02-08 ENCOUNTER — Ambulatory Visit: Payer: Medicare Other | Admitting: Internal Medicine

## 2012-02-08 ENCOUNTER — Telehealth: Payer: Self-pay | Admitting: Internal Medicine

## 2012-02-08 DIAGNOSIS — R9389 Abnormal findings on diagnostic imaging of other specified body structures: Secondary | ICD-10-CM

## 2012-02-08 DIAGNOSIS — F39 Unspecified mood [affective] disorder: Secondary | ICD-10-CM | POA: Diagnosis not present

## 2012-02-08 NOTE — Telephone Encounter (Signed)
Reviewed recent CT of chest with IV contrast results with patient in detail.  I suggest pulmonary consultation with Dr. Delford Field.

## 2012-02-09 ENCOUNTER — Encounter: Payer: Self-pay | Admitting: Critical Care Medicine

## 2012-02-09 ENCOUNTER — Ambulatory Visit (INDEPENDENT_AMBULATORY_CARE_PROVIDER_SITE_OTHER): Payer: Medicare Other | Admitting: Critical Care Medicine

## 2012-02-09 VITALS — BP 120/80 | HR 74 | Temp 98.3°F | Ht 66.0 in | Wt 143.0 lb

## 2012-02-09 DIAGNOSIS — J984 Other disorders of lung: Secondary | ICD-10-CM | POA: Diagnosis not present

## 2012-02-09 DIAGNOSIS — R911 Solitary pulmonary nodule: Secondary | ICD-10-CM | POA: Diagnosis not present

## 2012-02-09 NOTE — Assessment & Plan Note (Signed)
Chronic right upper lobe nodule stable to improved but now progression to a slight degree of left upper lobe nodule present since 2008. Prior sizing was 4 mm in diameter now 9 mm in diameter. The nodular characteristic is that of a groundglass appearance. There is no mediastinal or hilar adenopathy seen. This nodule appears to be benign in nature. This nodule does not have typical characteristics of malignancy. However due the possibility of a slow-growing bronchoalveolar carcinoma followup is warranted   Plan Repeat CT scan in 4 months. No other workup or treatment indicated at this time.

## 2012-02-09 NOTE — Progress Notes (Signed)
Subjective:    Patient ID: Sandra Craig, female    DOB: 19-May-1941, 71 y.o.   MRN: [id number]  HPI fx shoulder 2008,  2009 abn in [place] [facility].  F/u and no real issues.  Then went back for more surgery [facility] and had a scan and LUL is abn.   No real dyspnea or cough. Pt was in [place] 3months ago, had burning pain and dyspnea and if stopped went away.  Pt occ cough.  Pt does have heartburn.  No mold exposure. Now no chest pain or cough.  No f/c.  Occ sweats. No weight loss.  No joint pain or edema No real nasal drainage.   Smoked 1/2 PPD x 58yrs. 7Pack years   Past Medical History  Diagnosis Date  . IBS (irritable bowel syndrome)   . Hypertension   . Hyperlipidemia   . IC (interstitial cystitis)   . Endometriosis   . Polycystic kidney disease   . Osteopenia   . Cystitis   . Fibromyalgia   . Palpitations   . Chronic insomnia   . Diverticulosis   . Pulmonary nodule 12/08    5 mm Anterior RUL  . Hiatal hernia   . Diverticulosis of colon (without mention of hemorrhage)   . Internal hemorrhoid   . Family history of malignant neoplasm of gastrointestinal tract      Family History  Problem Relation Age of Onset  . Heart disease Mother     MI at age 55  . Lymphoma Maternal Grandmother   . Colon cancer Paternal Grandmother   . COPD Father   . Emphysema Mother   . Thyroid disease Mother     Thyroidectomy/Benign     History   Social History  . Marital Status: Widowed    Spouse Name: N/A    Number of Children: N/A  . Years of Education: N/A   Occupational History  . Retired     [employer]   Social History Main Topics  . Smoking status: Former Games developer  . Smokeless tobacco: Never Used   Comment: Quit in 1977  . Alcohol Use: No  . Drug Use: No  . Sexually Active: No   Other Topics Concern  . Not on file   Social History Narrative  . No narrative on file     Allergies  Allergen Reactions  . Celecoxib   . Hydrocodone   . Prednisone Other (See Comments)   Made pt feel "crazy"  . Sulfonamide Derivatives      Outpatient Prescriptions Prior to Visit  Medication Sig Dispense Refill  . Calcium Carbonate-Vitamin D (CALCIUM + D PO) Take by mouth. 1200 OF CALCIUM +D/TAKES 2 DAILY      . gabapentin (NEURONTIN) 100 MG capsule Take 1 capsule (100 mg total) by mouth daily.  90 capsule  1  . losartan (COZAAR) 50 MG tablet Take 1 tablet (50 mg total) by mouth daily.  90 tablet  1  . Multiple Vitamin (MULTIVITAMIN) capsule Take 1 capsule by mouth daily.        . rosuvastatin (CRESTOR) 20 MG tablet Take 1 tablet (20 mg total) by mouth daily.  90 tablet  1  . sertraline (ZOLOFT) 100 MG tablet Take 1 tablet (100 mg total) by mouth daily.  90 tablet  1  . zolpidem (AMBIEN) 10 MG tablet Take 1 tablet (10 mg total) by mouth at bedtime as needed.  90 tablet  1  . polyethylene glycol (MIRALAX / GLYCOLAX) packet Take  17 g by mouth daily.  100 each  3  . esomeprazole (NEXIUM) 40 MG capsule Take 1 capsule (40 mg total) by mouth daily before breakfast.  90 capsule  1     Review of Systems Constitutional:   No  weight loss, night sweats,  Fevers, chills, fatigue, lassitude. HEENT:   No headaches,  Difficulty swallowing,  Tooth/dental problems,  Sore throat,                No sneezing, itching, ear ache, nasal congestion, post nasal drip,   CV:  No chest pain,  Orthopnea, PND, swelling in lower extremities, anasarca, dizziness, palpitations  GI  No heartburn, indigestion, abdominal pain, nausea, vomiting, diarrhea, change in bowel habits, loss of appetite  Resp: No shortness of breath with exertion or at rest.  No excess mucus, no productive cough,  No non-productive cough,  No coughing up of blood.  No change in color of mucus.  No wheezing.  No chest wall deformity  Skin: no rash or lesions.  GU: no dysuria, change in color of urine, no urgency or frequency.  No flank pain.  MS:  No joint pain or swelling.  No decreased range of motion.  No back pain.  Psych:   No change in mood or affect. No depression or anxiety.  No memory loss.     Objective:   Physical Exam  Filed Vitals:   02/09/12 1448  BP: 120/80  Pulse: 74  Temp: 98.3 F (36.8 C)  TempSrc: Oral  Height: 5\' 6"  (1.676 m)  Weight: 143 lb (64.864 kg)  SpO2: 97%    Gen: Pleasant, well-nourished, in no distress,  normal affect  ENT: No lesions,  mouth clear,  oropharynx clear, no postnasal drip  Neck: No JVD, no TMG, no carotid bruits  Lungs: No use of accessory muscles, no dullness to percussion, clear without rales or rhonchi  Cardiovascular: RRR, heart sounds normal, no murmur or gallops, no peripheral edema  Abdomen: soft and NT, no HSM,  BS normal  Musculoskeletal: No deformities, no cyanosis or clubbing  Neuro: alert, non focal  Skin: Warm, no lesions or rashes  CT scan was reviewed from    7/9/ 13  IMPRESSION:  1. The small nodule noted anteriorly in the medial right upper  lobe is smaller and most likely postinflammatory.  2. 9 mm ground-glass opacity within the lingula does appear to  have slowly enlarged since the CT of 2008. A slow-growing low  grade adenocarcinoma therefore cannot be excluded and follow-up CT  of the chest in 1 year is recommended to assess stability. 3. 3  mm noncalcified nodule in the superior segment of the right lower  lobe of doubtful clinical significance.       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 Prior sizing was 4 mm in diameter now 9 mm in diameter. The nodular characteristic is that of a groundglass appearance. There is no mediastinal or hilar adenopathy seen. This nodule appears to be benign in nature. This nodule does not have typical characteristics of malignancy. However due the possibility of a slow-growing bronchoalveolar carcinoma followup is warranted   Plan Repeat CT scan in 4  months. No other workup or treatment indicated at this time.   Updated Medication List Outpatient Encounter Prescriptions as of 02/09/2012  Medication Sig Dispense Refill  . Calcium  Carbonate-Vitamin D (CALCIUM + D PO) Take by mouth. 1200 OF CALCIUM +D/TAKES 2 DAILY      . gabapentin (NEURONTIN) 100 MG capsule Take 1 capsule (100 mg total) by mouth daily.  90 capsule  1  . losartan (COZAAR) 50 MG tablet Take 1 tablet (50 mg total) by mouth daily.  90 tablet  1  . Multiple Vitamin (MULTIVITAMIN) capsule Take 1 capsule by mouth daily.        . polyethylene glycol (MIRALAX / GLYCOLAX) packet Take 17 g by mouth as needed.      . rosuvastatin (CRESTOR) 20 MG tablet Take 1 tablet (20 mg total) by mouth daily.  90 tablet  1  . sertraline (ZOLOFT) 100 MG tablet Take 1 tablet (100 mg total) by mouth daily.  90 tablet  1  . zolpidem (AMBIEN) 10 MG tablet Take 1 tablet (10 mg total) by mouth at bedtime as needed.  90 tablet  1  . DISCONTD: polyethylene glycol (MIRALAX / GLYCOLAX) packet Take 17 g by mouth daily.  100 each  3  . DISCONTD: esomeprazole (NEXIUM) 40 MG capsule Take 1 capsule (40 mg total) by mouth daily before breakfast.  90 capsule  1

## 2012-02-09 NOTE — Patient Instructions (Signed)
Repeat CT chest in November 2013 No other changes at this time You are clear for shoulder surgery

## 2012-02-11 ENCOUNTER — Encounter (HOSPITAL_COMMUNITY): Payer: Self-pay | Admitting: *Deleted

## 2012-02-11 ENCOUNTER — Emergency Department (HOSPITAL_COMMUNITY): Payer: Medicare Other

## 2012-02-11 ENCOUNTER — Inpatient Hospital Stay (HOSPITAL_COMMUNITY)
Admission: EM | Admit: 2012-02-11 | Discharge: 2012-02-17 | DRG: 392 | Disposition: A | Discharge status: Home / Self Care | Payer: Medicare Other | Attending: General Surgery | Admitting: General Surgery

## 2012-02-11 DIAGNOSIS — M899 Disorder of bone, unspecified: Secondary | ICD-10-CM | POA: Diagnosis present

## 2012-02-11 DIAGNOSIS — K5732 Diverticulitis of large intestine without perforation or abscess without bleeding: Secondary | ICD-10-CM

## 2012-02-11 DIAGNOSIS — IMO0001 Reserved for inherently not codable concepts without codable children: Secondary | ICD-10-CM | POA: Diagnosis present

## 2012-02-11 DIAGNOSIS — F3289 Other specified depressive episodes: Secondary | ICD-10-CM | POA: Diagnosis present

## 2012-02-11 DIAGNOSIS — M79609 Pain in unspecified limb: Secondary | ICD-10-CM | POA: Diagnosis not present

## 2012-02-11 DIAGNOSIS — K449 Diaphragmatic hernia without obstruction or gangrene: Secondary | ICD-10-CM | POA: Diagnosis present

## 2012-02-11 DIAGNOSIS — L0390 Cellulitis, unspecified: Secondary | ICD-10-CM | POA: Diagnosis not present

## 2012-02-11 DIAGNOSIS — F329 Major depressive disorder, single episode, unspecified: Secondary | ICD-10-CM | POA: Diagnosis present

## 2012-02-11 DIAGNOSIS — K589 Irritable bowel syndrome without diarrhea: Secondary | ICD-10-CM | POA: Diagnosis present

## 2012-02-11 DIAGNOSIS — I10 Essential (primary) hypertension: Secondary | ICD-10-CM | POA: Diagnosis present

## 2012-02-11 DIAGNOSIS — G4700 Insomnia, unspecified: Secondary | ICD-10-CM | POA: Diagnosis present

## 2012-02-11 DIAGNOSIS — R0789 Other chest pain: Secondary | ICD-10-CM | POA: Diagnosis not present

## 2012-02-11 DIAGNOSIS — K3580 Unspecified acute appendicitis: Secondary | ICD-10-CM | POA: Diagnosis not present

## 2012-02-11 DIAGNOSIS — R109 Unspecified abdominal pain: Secondary | ICD-10-CM | POA: Diagnosis not present

## 2012-02-11 DIAGNOSIS — Z96649 Presence of unspecified artificial hip joint: Secondary | ICD-10-CM

## 2012-02-11 DIAGNOSIS — K630 Abscess of intestine: Secondary | ICD-10-CM | POA: Diagnosis not present

## 2012-02-11 DIAGNOSIS — R1032 Left lower quadrant pain: Secondary | ICD-10-CM | POA: Diagnosis not present

## 2012-02-11 DIAGNOSIS — R079 Chest pain, unspecified: Secondary | ICD-10-CM | POA: Diagnosis not present

## 2012-02-11 DIAGNOSIS — L03319 Cellulitis of trunk, unspecified: Secondary | ICD-10-CM | POA: Diagnosis not present

## 2012-02-11 DIAGNOSIS — E785 Hyperlipidemia, unspecified: Secondary | ICD-10-CM | POA: Diagnosis present

## 2012-02-11 DIAGNOSIS — Z87891 Personal history of nicotine dependence: Secondary | ICD-10-CM

## 2012-02-11 DIAGNOSIS — L02219 Cutaneous abscess of trunk, unspecified: Secondary | ICD-10-CM | POA: Diagnosis not present

## 2012-02-11 DIAGNOSIS — K5792 Diverticulitis of intestine, part unspecified, without perforation or abscess without bleeding: Secondary | ICD-10-CM

## 2012-02-11 LAB — CBC WITH DIFFERENTIAL/PLATELET
Basophils Absolute: 0 10*3/uL (ref 0.0–0.1)
Basophils Relative: 0 % (ref 0–1)
Eosinophils Absolute: 0.1 10*3/uL (ref 0.0–0.7)
Eosinophils Relative: 1 % (ref 0–5)
HCT: 36.5 % (ref 36.0–46.0)
Hemoglobin: 11.6 g/dL — ABNORMAL LOW (ref 12.0–15.0)
Lymphocytes Relative: 15 % (ref 12–46)
Lymphs Abs: 1.3 10*3/uL (ref 0.7–4.0)
MCH: 26.7 pg (ref 26.0–34.0)
MCHC: 31.8 g/dL (ref 30.0–36.0)
MCV: 83.9 fL (ref 78.0–100.0)
Monocytes Absolute: 0.6 10*3/uL (ref 0.1–1.0)
Monocytes Relative: 7 % (ref 3–12)
Neutro Abs: 7 10*3/uL (ref 1.7–7.7)
Neutrophils Relative %: 78 % — ABNORMAL HIGH (ref 43–77)
Platelets: 220 10*3/uL (ref 150–400)
RBC: 4.35 MIL/uL (ref 3.87–5.11)
RDW: 16.7 % — ABNORMAL HIGH (ref 11.5–15.5)
WBC: 9 10*3/uL (ref 4.0–10.5)

## 2012-02-11 LAB — BASIC METABOLIC PANEL
BUN: 18 mg/dL (ref 6–23)
CO2: 27 mEq/L (ref 19–32)
Calcium: 9.7 mg/dL (ref 8.4–10.5)
Chloride: 105 mEq/L (ref 96–112)
Creatinine, Ser: 1.26 mg/dL — ABNORMAL HIGH (ref 0.50–1.10)
GFR calc Af Amer: 49 mL/min — ABNORMAL LOW (ref >90)
GFR calc non Af Amer: 42 mL/min — ABNORMAL LOW (ref >90)
Glucose, Bld: 94 mg/dL (ref 70–99)
Potassium: 3.9 mEq/L (ref 3.5–5.1)
Sodium: 142 mEq/L (ref 135–145)

## 2012-02-11 LAB — POCT I-STAT TROPONIN I: Troponin i, poc: 0 ng/mL (ref 0.00–0.08)

## 2012-02-11 MED ORDER — PANTOPRAZOLE SODIUM 40 MG IV SOLR
40.0000 mg | Freq: Every day | INTRAVENOUS | Status: DC
  Administered 2012-02-12 – 2012-02-13 (×3): 40 mg via INTRAVENOUS
  Filled 2012-02-11 (×5): qty 40

## 2012-02-11 MED ORDER — METRONIDAZOLE IN NACL 5-0.79 MG/ML-% IV SOLN
500.0000 mg | Freq: Once | INTRAVENOUS | Status: AC
Start: 2012-02-11 — End: 2012-02-11
  Administered 2012-02-11: 500 mg via INTRAVENOUS
  Filled 2012-02-11: qty 100

## 2012-02-11 MED ORDER — ZOLPIDEM TARTRATE 5 MG PO TABS
5.0000 mg | ORAL_TABLET | Freq: Every evening | ORAL | Status: DC | PRN
  Administered 2012-02-12 – 2012-02-16 (×6): 5 mg via ORAL
  Filled 2012-02-11 (×6): qty 1

## 2012-02-11 MED ORDER — IOHEXOL 300 MG/ML  SOLN
20.0000 mL | INTRAMUSCULAR | Status: AC
  Administered 2012-02-11: 20 mL via ORAL

## 2012-02-11 MED ORDER — KCL IN DEXTROSE-NACL 20-5-0.45 MEQ/L-%-% IV SOLN
INTRAVENOUS | Status: DC
  Administered 2012-02-12 – 2012-02-15 (×6): via INTRAVENOUS
  Filled 2012-02-11 (×9): qty 1000

## 2012-02-11 MED ORDER — ENOXAPARIN SODIUM 40 MG/0.4ML ~~LOC~~ SOLN
40.0000 mg | SUBCUTANEOUS | Status: DC
  Administered 2012-02-12 – 2012-02-16 (×6): 40 mg via SUBCUTANEOUS
  Filled 2012-02-11 (×8): qty 0.4

## 2012-02-11 MED ORDER — OXYCODONE-ACETAMINOPHEN 5-325 MG PO TABS: 2.0000 | ORAL_TABLET | ORAL | Status: AC | PRN

## 2012-02-11 MED ORDER — LOSARTAN POTASSIUM 50 MG PO TABS
50.0000 mg | ORAL_TABLET | Freq: Every day | ORAL | Status: DC
  Administered 2012-02-12 – 2012-02-14 (×3): 50 mg via ORAL
  Filled 2012-02-11 (×5): qty 1

## 2012-02-11 MED ORDER — SERTRALINE HCL 100 MG PO TABS
100.0000 mg | ORAL_TABLET | Freq: Every day | ORAL | Status: DC
  Administered 2012-02-12 – 2012-02-17 (×6): 100 mg via ORAL
  Filled 2012-02-11 (×7): qty 1

## 2012-02-11 MED ORDER — CIPROFLOXACIN IN D5W 400 MG/200ML IV SOLN
400.0000 mg | Freq: Two times a day (BID) | INTRAVENOUS | Status: DC
  Administered 2012-02-12 – 2012-02-14 (×6): 400 mg via INTRAVENOUS
  Filled 2012-02-11 (×8): qty 200

## 2012-02-11 MED ORDER — IOHEXOL 300 MG/ML  SOLN
65.0000 mL | Freq: Once | INTRAMUSCULAR | Status: AC | PRN
  Administered 2012-02-11: 65 mL via INTRAVENOUS

## 2012-02-11 MED ORDER — MORPHINE SULFATE 2 MG/ML IJ SOLN
2.0000 mg | INTRAMUSCULAR | Status: DC | PRN
  Administered 2012-02-11: 2 mg via INTRAVENOUS
  Administered 2012-02-12 (×2): 4 mg via INTRAVENOUS
  Administered 2012-02-12 – 2012-02-13 (×5): 2 mg via INTRAVENOUS
  Administered 2012-02-14 (×3): 4 mg via INTRAVENOUS
  Administered 2012-02-14: 2 mg via INTRAVENOUS
  Administered 2012-02-15: 4 mg via INTRAVENOUS
  Administered 2012-02-15: 2 mg via INTRAVENOUS
  Filled 2012-02-11 (×3): qty 2
  Filled 2012-02-11: qty 1
  Filled 2012-02-11: qty 2
  Filled 2012-02-11 (×8): qty 1
  Filled 2012-02-11: qty 2
  Filled 2012-02-11: qty 1

## 2012-02-11 MED ORDER — METRONIDAZOLE IN NACL 5-0.79 MG/ML-% IV SOLN
500.0000 mg | Freq: Three times a day (TID) | INTRAVENOUS | Status: DC
  Administered 2012-02-12 – 2012-02-14 (×9): 500 mg via INTRAVENOUS
  Filled 2012-02-11 (×11): qty 100

## 2012-02-11 MED ORDER — METRONIDAZOLE 500 MG PO TABS: 500.0000 mg | ORAL_TABLET | Freq: Three times a day (TID) | ORAL | Status: AC

## 2012-02-11 MED ORDER — CIPROFLOXACIN IN D5W 400 MG/200ML IV SOLN
400.0000 mg | Freq: Once | INTRAVENOUS | Status: AC
  Administered 2012-02-11: 400 mg via INTRAVENOUS
  Filled 2012-02-11: qty 200

## 2012-02-11 MED ORDER — GABAPENTIN 100 MG PO CAPS
100.0000 mg | ORAL_CAPSULE | Freq: Every day | ORAL | Status: DC
  Administered 2012-02-12 – 2012-02-16 (×5): 100 mg via ORAL
  Filled 2012-02-11 (×6): qty 1

## 2012-02-11 MED ORDER — CIPROFLOXACIN HCL 500 MG PO TABS: 500.0000 mg | ORAL_TABLET | Freq: Two times a day (BID) | ORAL | Status: AC

## 2012-02-11 MED ORDER — ONDANSETRON HCL 4 MG/2ML IJ SOLN
4.0000 mg | Freq: Four times a day (QID) | INTRAMUSCULAR | Status: DC | PRN
  Administered 2012-02-12 – 2012-02-14 (×3): 4 mg via INTRAVENOUS
  Filled 2012-02-11 (×3): qty 2

## 2012-02-11 NOTE — ED Notes (Signed)
Reports having hx of bowel obstruction and diverticulitis, started having sharp abd pains last night with nausea, today started having a burning chest pain that radiates down left arm. ekg done at triage.

## 2012-02-11 NOTE — ED Notes (Signed)
Patient transported to X-ray 

## 2012-02-11 NOTE — ED Notes (Signed)
Getting patient ready for discharge physician back in to see patient. Patient will not be discharges as of yet. He has spoke to the radiologist and patient will receive her first dose of antibiotics in the ED.

## 2012-02-11 NOTE — ED Notes (Signed)
Back from xray

## 2012-02-11 NOTE — H&P (Signed)
Sandra Craig is an 71 y.o. female.   Chief Complaint: Left lower quadrant abdominal pain HPI: Patient awoke from sleep last night with severe left lower quadrant abdominal pain. She has a known history of colonic diverticulosis. She has never had diverticulitis in the past. The pain persisted. She came to the emergency department. CT scan demonstrates sigmoid diverticulitis with intramural abscess. There is no perforation. She continues to have pain. She has no significant nausea or vomiting. She is known to our service status post lysis of adhesions for small bowel obstruction 2 years ago.  Past Medical History  Diagnosis Date  . IBS (irritable bowel syndrome)   . Hypertension   . Hyperlipidemia   . IC (interstitial cystitis)   . Endometriosis   . Polycystic kidney disease   . Osteopenia   . Cystitis   . Fibromyalgia   . Palpitations   . Chronic insomnia   . Diverticulosis   . Pulmonary nodule 12/08    5 mm Anterior RUL  . Hiatal hernia   . Diverticulosis of colon (without mention of hemorrhage)   . Internal hemorrhoid   . Family history of malignant neoplasm of gastrointestinal tract     Past Surgical History  Procedure Date  . Cholecystectomy   . Pelvic laparoscopy   . Abdominal hysterectomy 1994    TAH,BSO FOR ENDOMETRIOSIS  . Oophorectomy 1994    TAH,BSO  . Total hip arthroplasty FALL OF 2008    rt. partial hip replacement  . Abdominal surgery 2011    small intestine blockage  . S/p right shoulder rotater cuff 200216/2011    Tear/adhesive capsulitis  . Sbo lap 11    Adhesions and small internal hernia    Family History  Problem Relation Age of Onset  . Heart disease Mother     MI at age 45  . Lymphoma Maternal Grandmother   . Colon cancer Paternal Grandmother   . COPD Father   . Emphysema Mother   . Thyroid disease Mother     Thyroidectomy/Benign   Social History:  reports that she has quit smoking. She has never used smokeless tobacco. She reports that  she does not drink alcohol or use illicit drugs.  Allergies:  Allergies  Allergen Reactions  . Celecoxib   . Hydrocodone   . Prednisone Other (See Comments)    Made pt feel "crazy"  . Sulfonamide Derivatives      (Not in a hospital admission)  Results for orders placed during the hospital encounter of 02/11/12 (from the past 48 hour(s))  BASIC METABOLIC PANEL     Status: Abnormal   Collection Time   02/11/12  3:30 PM      Component Value Range Comment   Sodium 142  135 - 145 mEq/L    Potassium 3.9  3.5 - 5.1 mEq/L    Chloride 105  96 - 112 mEq/L    CO2 27  19 - 32 mEq/L    Glucose, Bld 94  70 - 99 mg/dL    BUN 18  6 - 23 mg/dL    Creatinine, Ser 9.60 (*) 0.50 - 1.10 mg/dL    Calcium 9.7  8.4 - 45.4 mg/dL    GFR calc non Af Amer 42 (*) >90 mL/min    GFR calc Af Amer 49 (*) >90 mL/min   CBC WITH DIFFERENTIAL     Status: Abnormal   Collection Time   02/11/12  3:30 PM      Component Value Range  Comment   WBC 9.0  4.0 - 10.5 K/uL    RBC 4.35  3.87 - 5.11 MIL/uL    Hemoglobin 11.6 (*) 12.0 - 15.0 g/dL    HCT 45.4  09.8 - 11.9 %    MCV 83.9  78.0 - 100.0 fL    MCH 26.7  26.0 - 34.0 pg    MCHC 31.8  30.0 - 36.0 g/dL    RDW 14.7 (*) 82.9 - 15.5 %    Platelets 220  150 - 400 K/uL    Neutrophils Relative 78 (*) 43 - 77 %    Neutro Abs 7.0  1.7 - 7.7 K/uL    Lymphocytes Relative 15  12 - 46 %    Lymphs Abs 1.3  0.7 - 4.0 K/uL    Monocytes Relative 7  3 - 12 %    Monocytes Absolute 0.6  0.1 - 1.0 K/uL    Eosinophils Relative 1  0 - 5 %    Eosinophils Absolute 0.1  0.0 - 0.7 K/uL    Basophils Relative 0  0 - 1 %    Basophils Absolute 0.0  0.0 - 0.1 K/uL   POCT I-STAT TROPONIN I     Status: Normal   Collection Time   02/11/12  4:20 PM      Component Value Range Comment   Troponin i, poc 0.00  0.00 - 0.08 ng/mL    Comment 3             Dg Chest 2 View  02/11/2012  *RADIOLOGY REPORT*  Clinical Data: Left-sided chest pain radiating to left arm. Shortness of breath.   Hypertension.  CHEST - 2 VIEW  Comparison: 06/10/2010  Findings: Heart size is normal.  Both lungs are clear.  No evidence of pleural effusion.  Mild pulmonary hyperinflation noted.  No mass or lymphadenopathy identified.  IMPRESSION: Stable exam.  No active disease.  Original Report Authenticated By: Danae Orleans, M.D.   Ct Abdomen Pelvis W Contrast  02/11/2012  *RADIOLOGY REPORT*  Clinical Data: Left lower quadrant pain.  History cholecystectomy and hysterectomy.  Inflammatory bowel disease.  History small bowel obstruction and surgery.  CT ABDOMEN AND PELVIS WITH CONTRAST  Technique:  Multidetector CT imaging of the abdomen and pelvis was performed following the standard protocol during bolus administration of intravenous contrast.  Contrast: 65mL OMNIPAQUE IOHEXOL 300 MG/ML  SOLN .  Comparison: 06/09/2010  Findings: Sigmoid diverticulitis with focal abscess within the wall of the sigmoid colon measuring up to 1.5 cm (immediately adjacent to the bladder and distorting the left posterior lateral wall of the bladder).  Small amount of fluid within the pelvis. Distal sigmoid colon and rectal wall thickening consistent  with colitis type changes.  No free intraperitoneal air.  Contrast filled appendix with slightly thickened appearance.  The patient's symptoms are on the opposite side and therefore acute appendicitis as cause for this appearance felt to be secondary consideration.  Clinical correlation recommended.  Right hip replacement causes streak artifact through the pelvis.  Basilar subsegmental atelectatic changes/scarring.  Post cholecystectomy.  Mild prominence intrahepatic biliary ducts unchanged.  Prominence of the common bile duct stable.  Cyst arising from the inferior aspect of the medial segment of the left lobe liver has increased slightly in size measuring 4.2 x 3.4 x 4.1 cm versus prior 3.9 x 3.2 x 4 cm.  Post cholecystectomy.  No focal splenic, pancreatic or adrenal lesion.  No hydronephrosis.   There are multiple bilateral renal  lesions some of which are simple cysts.  Others cannot be adequately characterized as simple cysts.  These have changed minimally since prior examination and can be further assessed with elective renal MR if clinically desired.  Atherosclerotic type changes of the aorta without aneurysmal dilation.  Small lymph nodes within the pelvis may be reactive in origin.  No bony destructive lesion.  Spinal stenosis lower lumbar region.  IMPRESSION:  Sigmoid diverticulitis with focal abscess within the wall of the sigmoid colon measuring up to 1.5 cm (immediately adjacent to the bladder and distorting the left posterior lateral wall of the bladder).  Small amount of fluid within the pelvis. Distal sigmoid colon and rectal wall thickening consistent  with colitis type changes.  Contrast filled appendix with slightly thickened appearance.  The patient's symptoms are on the opposite side and therefore acute appendicitis as cause for this appearance felt to be secondary consideration.  Clinical correlation recommended.  Please see above.  Critical Value/emergent results were called by telephone at the time of interpretation on 02/11/2012 at 6:24 p.m. to Dr. Denton Lank, who verbally acknowledged these results.  Original Report Authenticated By: Fuller Canada, M.D.    Review of Systems  Constitutional: Negative for fever and chills.  HENT: Negative.   Eyes: Negative.   Respiratory: Negative.   Cardiovascular: Negative for chest pain and leg swelling.  Gastrointestinal: Positive for abdominal pain. Negative for nausea, vomiting, constipation, blood in stool and melena.  Genitourinary: Negative.   Musculoskeletal:       Significant right shoulder pain with plan for arthroplasty next month by Dr. Dion Saucier  Skin: Negative.   Neurological: Negative.   Endo/Heme/Allergies: Negative.     Blood pressure 145/75, pulse 68, temperature 97.7 F (36.5 C), temperature source Oral, resp. rate 16,  last menstrual period 08/25/1992, SpO2 98.00%. Physical Exam  Constitutional: She is oriented to person, place, and time. She appears well-developed and well-nourished. No distress.  HENT:  Head: Normocephalic and atraumatic.  Mouth/Throat: No oropharyngeal exudate.  Eyes: EOM are normal. Pupils are equal, round, and reactive to light. No scleral icterus.  Neck: Normal range of motion. No tracheal deviation present.  Cardiovascular: Normal rate, regular rhythm, normal heart sounds and intact distal pulses.   No murmur heard. Respiratory: Effort normal and breath sounds normal. No stridor. No respiratory distress. She has no wheezes. She has no rales.  GI: Soft. Bowel sounds are normal. She exhibits mass. She exhibits no distension. There is tenderness. There is no rebound and no guarding.       Tender fullness left lower quadrant without guarding or peritoneal signs, well-healed lower midline scar without hernia  Musculoskeletal:       Limited range of motion and discomfort with movement right shoulder  Lymphadenopathy:    She has no cervical adenopathy.  Neurological: She is alert and oriented to person, place, and time.       Speech fluent  Skin: Skin is warm and dry.     Assessment/Plan Sigmoid diverticulitis with intramural colonic wall abscess. We'll admit for IV antibiotics and bowel rest. If she worsens, she may need further intervention which may include partial colectomy and colostomy. Plan of care was discussed in detail with the patient and her husband. I answered their questions.  Haillee Johann E 02/11/2012, 7:38 PM

## 2012-02-11 NOTE — ED Notes (Signed)
To x-ray for a CT scan

## 2012-02-11 NOTE — ED Provider Notes (Signed)
History     CSN: [id number]  Arrival date & time 02/11/12  1449   First MD Initiated Contact with Patient 02/11/12 1500      Chief Complaint  Patient presents with  . Chest Pain    (Consider location/radiation/quality/duration/timing/severity/associated sxs/prior treatment) Patient is a 71 y.o. female presenting with chest pain and abdominal pain. The history is provided by the patient.  Chest Pain The chest pain began yesterday. Duration of episode(s) is 2 hours. Chest pain occurs rarely. The chest pain is resolved. Associated with: nothing. The severity of the pain is mild. The quality of the pain is described as burning. The pain radiates to the left arm. Exacerbated by: nothing. Primary symptoms include abdominal pain. Pertinent negatives for primary symptoms include no fever, no fatigue, no shortness of breath, no cough, no wheezing, no palpitations, no nausea, no vomiting and no dizziness.  Pertinent negatives for associated symptoms include no diaphoresis and no numbness. She tried nothing for the symptoms. Risk factors include post-menopausal.  Her past medical history is significant for hyperlipidemia and hypertension.  Pertinent negatives for past medical history include no seizures.  Her family medical history is significant for CAD in family.  Procedure history is positive for exercise treadmill test.    Abdominal Pain The primary symptoms of the illness include abdominal pain. The primary symptoms of the illness do not include fever, fatigue, shortness of breath, nausea, vomiting, diarrhea or vaginal discharge. The current episode started yesterday. The onset of the illness was sudden. Progression since onset: waxing and waning.  The abdominal pain is located in the LLQ. The abdominal pain does not radiate. The abdominal pain is relieved by nothing. The abdominal pain is exacerbated by bowel movements.  Risk factors for an acute abdominal problem include a history of  abdominal surgery. Symptoms associated with the illness do not include chills, diaphoresis, hematuria or back pain. Significant associated medical issues include diverticulitis.    Past Medical History  Diagnosis Date  . IBS (irritable bowel syndrome)   . Hypertension   . Hyperlipidemia   . IC (interstitial cystitis)   . Endometriosis   . Polycystic kidney disease   . Osteopenia   . Cystitis   . Fibromyalgia   . Palpitations   . Chronic insomnia   . Diverticulosis   . Pulmonary nodule 12/08    5 mm Anterior RUL  . Hiatal hernia   . Diverticulosis of colon (without mention of hemorrhage)   . Internal hemorrhoid   . Family history of malignant neoplasm of gastrointestinal tract     Past Surgical History  Procedure Date  . Cholecystectomy   . Pelvic laparoscopy   . Abdominal hysterectomy 1994    TAH,BSO FOR ENDOMETRIOSIS  . Oophorectomy 1994    TAH,BSO  . Total hip arthroplasty FALL OF 2008    rt. partial hip replacement  . Abdominal surgery 2011    small intestine blockage  . S/p right shoulder rotater cuff 200216/2011    Tear/adhesive capsulitis  . Sbo lap 11    Adhesions and small internal hernia    Family History  Problem Relation Age of Onset  . Heart disease Mother     MI at age 84  . Lymphoma Maternal Grandmother   . Colon cancer Paternal Grandmother   . COPD Father   . Emphysema Mother   . Thyroid disease Mother     Thyroidectomy/Benign    History  Substance Use Topics  . Smoking status: Former Games developer  .  Smokeless tobacco: Never Used   Comment: Quit in 1977  . Alcohol Use: No    OB History    Grav Para Term Preterm Abortions TAB SAB Ect Mult Living   1 0   1  1   0      Review of Systems  Constitutional: Negative for fever, chills, diaphoresis and fatigue.  HENT: Negative for ear pain, congestion, sore throat, facial swelling, mouth sores, trouble swallowing, neck pain and neck stiffness.   Eyes: Negative.   Respiratory: Negative for  apnea, cough, chest tightness, shortness of breath and wheezing.   Cardiovascular: Positive for chest pain. Negative for palpitations and leg swelling.  Gastrointestinal: Positive for abdominal pain. Negative for nausea, vomiting, diarrhea, blood in stool, abdominal distention and anal bleeding.  Genitourinary: Negative for hematuria, flank pain, vaginal discharge, difficulty urinating and menstrual problem.  Musculoskeletal: Negative for back pain and gait problem.  Skin: Negative for rash and wound.  Neurological: Negative for dizziness, tremors, seizures, syncope, facial asymmetry, numbness and headaches.  Psychiatric/Behavioral: Negative.   All other systems reviewed and are negative.    Allergies  Celecoxib; Hydrocodone; Prednisone; and Sulfonamide derivatives  Home Medications   Current Outpatient Rx  Name Route Sig Dispense Refill  . CALCIUM + D PO Oral Take 2 tablets by mouth daily. 1200 OF CALCIUM +D    . GABAPENTIN 100 MG PO CAPS Oral Take 100 mg by mouth daily.    Marland Kitchen LOSARTAN POTASSIUM 50 MG PO TABS Oral Take 50 mg by mouth daily.    . MULTIVITAMINS PO CAPS Oral Take 1 capsule by mouth daily.      Marland Kitchen POLYETHYLENE GLYCOL 3350 PO PACK Oral Take 17 g by mouth as needed. For constipation.    Marland Kitchen ROSUVASTATIN CALCIUM 20 MG PO TABS Oral Take 20 mg by mouth daily.    . SERTRALINE HCL 100 MG PO TABS Oral Take 100 mg by mouth daily.    Marland Kitchen ZOLPIDEM TARTRATE 10 MG PO TABS Oral Take 10 mg by mouth at bedtime as needed. For sleep.      BP 127/77  Pulse 86  Temp 97.7 F (36.5 C) (Oral)  Resp 14  SpO2 95%  LMP 08/25/1992  Physical Exam  Nursing note and vitals reviewed. Constitutional: She is oriented to person, place, and time. She appears well-developed and well-nourished. No distress.  HENT:  Head: Normocephalic and atraumatic.  Right Ear: External ear normal.  Left Ear: External ear normal.  Nose: Nose normal.  Mouth/Throat: Oropharynx is clear and moist. No oropharyngeal  exudate.  Eyes: Conjunctivae and EOM are normal. Pupils are equal, round, and reactive to light. Right eye exhibits no discharge. Left eye exhibits no discharge.  Neck: Normal range of motion. Neck supple. No JVD present. No tracheal deviation present. No thyromegaly present.  Cardiovascular: Normal rate, regular rhythm, normal heart sounds and intact distal pulses.  Exam reveals no gallop and no friction rub.   No murmur heard. Pulmonary/Chest: Effort normal and breath sounds normal. No respiratory distress. She has no wheezes. She has no rales. She exhibits no tenderness.  Abdominal: Soft. Bowel sounds are normal. She exhibits no distension. There is tenderness in the left lower quadrant. There is guarding. There is no rigidity and no rebound.  Musculoskeletal: Normal range of motion.  Lymphadenopathy:    She has no cervical adenopathy.  Neurological: She is alert and oriented to person, place, and time. No cranial nerve deficit. Coordination normal.  Skin: Skin is warm. No  rash noted. She is not diaphoretic.  Psychiatric: She has a normal mood and affect. Her behavior is normal. Judgment and thought content normal.    ED Course  Procedures (including critical care time)   Labs Reviewed  BASIC METABOLIC PANEL  CBC WITH DIFFERENTIAL   No results found.   No diagnosis found.    MDM  71 year old female patient here for complaint of chest pain and abdominal pain. In regards to the chest pain patient has a history of hypertension hyperlipidemia. Patient says she started having burning aching discomfort in her left upper chest that radiated to her shoulder. 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 She says she has discomfort in her arm and shoulder today the pain in her chest started last night has since resolved. Started at rest not worse than by anything improved by anything. Seems atypical for ACS  patient Wells score is 0. Doubt pulmonary embolism without unilateral leg swelling tachypnea tachycardia or hypoxia. Patient without fevers nausea vomiting. We'll screen for emergent causes of chest pain with chest x-ray troponins EKG.   Date: 02/11/2012  Rate: 80  Rhythm: normal sinus rhythm  QRS Axis: normal  Intervals: normal  ST/T Wave abnormalities: normal  Conduction Disutrbances: none  Narrative Interpretation:   Old EKG Reviewed: No significant changes noted   In regards the abdominal pain patient says she's been having left lower quadrant pain and also started last night. She has a history of small bowel obstruction requiring surgery and diverticulosis. Patient says pain is 8/10 sharp waxing and waning. Pain worse with defecation. No blood in stool. Noted GU symptoms exam significant for left lower quadrant pain and some guarding. Given her history and her physical exam will get CAT scan to evaluate for diverticulitis and or possible abscess from the infection. As chest pain began last night and is now resolved and has been approximately 24 hours and patient with negative troponin and atypical features doubt ACS this time.  Results for orders placed during the hospital encounter of 02/11/12  BASIC METABOLIC PANEL      Component Value Range   Sodium 142  135 - 145 mEq/L   Potassium 3.9  3.5 - 5.1 mEq/L   Chloride 105  96 - 112 mEq/L   CO2 27  19 - 32 mEq/L   Glucose, Bld 94  70 - 99 mg/dL   BUN 18  6 - 23 mg/dL   Creatinine, Ser 1.61 (*) 0.50 - 1.10 mg/dL   Calcium 9.7  8.4 - 09.6 mg/dL   GFR calc non Af Amer 42 (*) >90 mL/min   GFR calc Af Amer 49 (*) >90 mL/min  CBC WITH DIFFERENTIAL      Component Value Range   WBC 9.0  4.0 - 10.5 K/uL   RBC 4.35  3.87 - 5.11 MIL/uL   Hemoglobin 11.6 (*) 12.0 - 15.0 g/dL   HCT 04.5  40.9 - 81.1 %   MCV 83.9  78.0 - 100.0 fL   MCH 26.7  26.0 - 34.0 pg   MCHC 31.8  30.0 - 36.0 g/dL   RDW 91.4 (*) 78.2 - 95.6 %   Platelets 220  150 - 400  K/uL   Neutrophils Relative 78 (*) 43 - 77 %   Neutro Abs 7.0  1.7 - 7.7 K/uL   Lymphocytes Relative 15  12 - 46 %   Lymphs Abs 1.3  0.7 -  4.0 K/uL   Monocytes Relative 7  3 - 12 %   Monocytes Absolute 0.6  0.1 - 1.0 K/uL   Eosinophils Relative 1  0 - 5 %   Eosinophils Absolute 0.1  0.0 - 0.7 K/uL   Basophils Relative 0  0 - 1 %   Basophils Absolute 0.0  0.0 - 0.1 K/uL  POCT I-STAT TROPONIN I      Component Value Range   Troponin i, poc 0.00  0.00 - 0.08 ng/mL   Comment 3            CT Abdomen Pelvis W Contrast (Final result)   Result time:02/11/12 1829    Final result by Rad Results In Interface (02/11/12 18:29:47)    Narrative:   *RADIOLOGY REPORT*  Clinical Data: Left lower quadrant pain. History cholecystectomy and hysterectomy. Inflammatory bowel disease. History small bowel obstruction and surgery.  CT ABDOMEN AND PELVIS WITH CONTRAST  Technique: Multidetector CT imaging of the abdomen and pelvis was performed following the standard protocol during bolus administration of intravenous contrast.  Contrast: 65mL OMNIPAQUE IOHEXOL 300 MG/ML SOLN .  Comparison: 06/09/2010  Findings: Sigmoid diverticulitis with focal abscess within the wall of the sigmoid colon measuring up to 1.5 cm (immediately adjacent to the bladder and distorting the left posterior lateral wall of the bladder). Small amount of fluid within the pelvis. Distal sigmoid colon and rectal wall thickening consistent with colitis type changes. No free intraperitoneal air.  Contrast filled appendix with slightly thickened appearance. The patient's symptoms are on the opposite side and therefore acute appendicitis as cause for this appearance felt to be secondary consideration. Clinical correlation recommended.  Right hip replacement causes streak artifact through the pelvis.  Basilar subsegmental atelectatic changes/scarring.  Post cholecystectomy. Mild prominence intrahepatic biliary ducts unchanged.  Prominence of the common bile duct stable. Cyst arising from the inferior aspect of the medial segment of the left lobe liver has increased slightly in size measuring 4.2 x 3.4 x 4.1 cm versus prior 3.9 x 3.2 x 4 cm.  Post cholecystectomy.  No focal splenic, pancreatic or adrenal lesion.  No hydronephrosis. There are multiple bilateral renal lesions some of which are simple cysts. Others cannot be adequately characterized as simple cysts. These have changed minimally since prior examination and can be further assessed with elective renal MR if clinically desired.  Atherosclerotic type changes of the aorta without aneurysmal dilation.  Small lymph nodes within the pelvis may be reactive in origin.  No bony destructive lesion. Spinal stenosis lower lumbar region.  IMPRESSION:  Sigmoid diverticulitis with focal abscess within the wall of the sigmoid colon measuring up to 1.5 cm (immediately adjacent to the bladder and distorting the left posterior lateral wall of the bladder). Small amount of fluid within the pelvis. Distal sigmoid colon and rectal wall thickening consistent with colitis type changes.  Contrast filled appendix with slightly thickened appearance. The patient's symptoms are on the opposite side and therefore acute appendicitis as cause for this appearance felt to be secondary consideration. Clinical correlation recommended.  Please see above.  Critical Value/emergent results were called by telephone at the time of interpretation on 02/11/2012 at 6:24 p.m. to Dr. Denton Lank, who verbally acknowledged these results.  Original Report Authenticated By: Fuller Canada, M.D.            DG Chest 2 View (Final result)   Result time:02/11/12 [phone]    Final result by Rad Results In Interface (02/11/12 16:27:57)    Narrative:   *  RADIOLOGY REPORT*  Clinical Data: Left-sided chest pain radiating to left arm. Shortness of breath. Hypertension.  CHEST - 2  VIEW  Comparison: 06/10/2010  Findings: Heart size is normal. Both lungs are clear. No evidence of pleural effusion. Mild pulmonary hyperinflation noted. No mass or lymphadenopathy identified.  IMPRESSION: Stable exam. No active disease.  Original Report Authenticated By: Danae Orleans, M.D.     Patient was evidence of diverticulitis an abscess in her sigmoid colon. Patient admitted to surgery patient given a dose of Cipro and Flagyl in the ED.  Case discussed with Dr. Anastasia Fiedler, MD 02/11/12 2146

## 2012-02-11 NOTE — ED Notes (Signed)
PT. TRANSPORTED TO [facility] , DENIES PAIN / RESPIRATIONS UNLABORED / IV SITE UNREMARKABLE.

## 2012-02-11 NOTE — ED Notes (Signed)
Patient back from x-ray 

## 2012-02-12 ENCOUNTER — Encounter (HOSPITAL_COMMUNITY): Payer: Self-pay | Admitting: *Deleted

## 2012-02-12 DIAGNOSIS — K630 Abscess of intestine: Secondary | ICD-10-CM

## 2012-02-12 DIAGNOSIS — R1032 Left lower quadrant pain: Secondary | ICD-10-CM | POA: Diagnosis not present

## 2012-02-12 DIAGNOSIS — K5732 Diverticulitis of large intestine without perforation or abscess without bleeding: Secondary | ICD-10-CM

## 2012-02-12 LAB — CBC
HCT: 33.2 % — ABNORMAL LOW (ref 36.0–46.0)
HCT: 34.5 % — ABNORMAL LOW (ref 36.0–46.0)
Hemoglobin: 10.4 g/dL — ABNORMAL LOW (ref 12.0–15.0)
Hemoglobin: 11 g/dL — ABNORMAL LOW (ref 12.0–15.0)
MCH: 26.5 pg (ref 26.0–34.0)
MCH: 26.8 pg (ref 26.0–34.0)
MCHC: 31.3 g/dL (ref 30.0–36.0)
MCHC: 31.9 g/dL (ref 30.0–36.0)
MCV: 83.9 fL (ref 78.0–100.0)
MCV: 84.5 fL (ref 78.0–100.0)
Platelets: 194 10*3/uL (ref 150–400)
Platelets: 201 10*3/uL (ref 150–400)
RBC: 3.93 MIL/uL (ref 3.87–5.11)
RBC: 4.11 MIL/uL (ref 3.87–5.11)
RDW: 16.8 % — ABNORMAL HIGH (ref 11.5–15.5)
RDW: 16.8 % — ABNORMAL HIGH (ref 11.5–15.5)
WBC: 6.3 10*3/uL (ref 4.0–10.5)
WBC: 7.8 10*3/uL (ref 4.0–10.5)

## 2012-02-12 LAB — BASIC METABOLIC PANEL
BUN: 13 mg/dL (ref 6–23)
CO2: 26 mEq/L (ref 19–32)
Calcium: 8.7 mg/dL (ref 8.4–10.5)
Chloride: 106 mEq/L (ref 96–112)
Creatinine, Ser: 1.19 mg/dL — ABNORMAL HIGH (ref 0.50–1.10)
GFR calc Af Amer: 53 mL/min — ABNORMAL LOW (ref >90)
GFR calc non Af Amer: 45 mL/min — ABNORMAL LOW (ref >90)
Glucose, Bld: 100 mg/dL — ABNORMAL HIGH (ref 70–99)
Potassium: 3.8 mEq/L (ref 3.5–5.1)
Sodium: 142 mEq/L (ref 135–145)

## 2012-02-12 NOTE — Progress Notes (Signed)
Patient ID: Sandra Craig, female   DOB: 10/15/1940, 71 y.o.   MRN: [id number]  General Surgery - [facility] - Progress Note  HD#2  Subjective: Patient with less pain.  No nausea.  No fever.  No chills.  Objective: Vital signs in last 24 hours: Temp:  [97.7 F (36.5 C)-98.5 F (36.9 C)] 98.2 F (36.8 C) (07/14 0446) Pulse Rate:  [67-86] 73  (07/14 0446) Resp:  [14-18] 18  (07/14 0446) BP: (107-145)/(60-77) 107/60 mmHg (07/14 0446) SpO2:  [95 %-99 %] 96 % (07/14 0446) Weight:  [142 lb 13.7 oz (64.8 kg)] 142 lb 13.7 oz (64.8 kg) (07/13 2220) Last BM Date: 02/10/12  Intake/Output from previous day: 07/13 0701 - 07/14 0700 In: 824 [I.V.:824] Out: 600 [Urine:600]  Exam: HEENT - clear, not icteric Neck - soft Chest - clear bilaterally Cor - RRR, no murmur Abd - soft without distension; BS present and active; minimal tenderness LLQ; no mass; no guarding Ext - no significant edema Neuro - grossly intact, no focal deficits  Lab Results:   Basename 02/12/12 0850 02/11/12 2331  WBC 6.3 7.8  HGB 10.4* 11.0*  HCT 33.2* 34.5*  PLT 201 194     Basename 02/11/12 1530  NA 142  K 3.9  CL 105  CO2 27  GLUCOSE 94  BUN 18  CREATININE 1.26*  CALCIUM 9.7    Studies/Results: Dg Chest 2 View  02/11/2012  *RADIOLOGY REPORT*  Clinical Data: Left-sided chest pain radiating to left arm. Shortness of breath.  Hypertension.  CHEST - 2 VIEW  Comparison: 06/10/2010  Findings: Heart size is normal.  Both lungs are clear.  No evidence of pleural effusion.  Mild pulmonary hyperinflation noted.  No mass or lymphadenopathy identified.  IMPRESSION: Stable exam.  No active disease.  Original Report Authenticated By: Danae Orleans, M.D.   Ct Abdomen Pelvis W Contrast  02/11/2012  *RADIOLOGY REPORT*  Clinical Data: Left lower quadrant pain.  History cholecystectomy and hysterectomy.  Inflammatory bowel disease.  History small bowel obstruction and surgery.  CT ABDOMEN AND  PELVIS WITH CONTRAST  Technique:  Multidetector CT imaging of the abdomen and pelvis was performed following the standard protocol during bolus administration of intravenous contrast.  Contrast: 65mL OMNIPAQUE IOHEXOL 300 MG/ML  SOLN .  Comparison: 06/09/2010  Findings: Sigmoid diverticulitis with focal abscess within the wall of the sigmoid colon measuring up to 1.5 cm (immediately adjacent to the bladder and distorting the left posterior lateral wall of the bladder).  Small amount of fluid within the pelvis. Distal sigmoid colon and rectal wall thickening consistent  with colitis type changes.  No free intraperitoneal air.  Contrast filled appendix with slightly thickened appearance.  The patient's symptoms are on the opposite side and therefore acute appendicitis as cause for this appearance felt to be secondary consideration.  Clinical correlation recommended.  Right hip replacement causes streak artifact through the pelvis.  Basilar subsegmental atelectatic changes/scarring.  Post cholecystectomy.  Mild prominence intrahepatic biliary ducts unchanged.  Prominence of the common bile duct stable.  Cyst arising from the inferior aspect of the medial segment of the left lobe liver has increased slightly in size measuring 4.2 x 3.4 x 4.1 cm versus prior 3.9 x 3.2 x 4 cm.  Post cholecystectomy.  No focal splenic, pancreatic or adrenal lesion.  No hydronephrosis.  There are multiple bilateral renal lesions some of which are simple cysts.  Others cannot be adequately characterized as simple cysts.  These have  changed minimally since prior examination and can be further assessed with elective renal MR if clinically desired.  Atherosclerotic type changes of the aorta without aneurysmal dilation.  Small lymph nodes within the pelvis may be reactive in origin.  No bony destructive lesion.  Spinal stenosis lower lumbar region.  IMPRESSION:  Sigmoid diverticulitis with focal abscess within the wall of the sigmoid colon  measuring up to 1.5 cm (immediately adjacent to the bladder and distorting the left posterior lateral wall of the bladder).  Small amount of fluid within the pelvis. Distal sigmoid colon and rectal wall thickening consistent  with colitis type changes.  Contrast filled appendix with slightly thickened appearance.  The patient's symptoms are on the opposite side and therefore acute appendicitis as cause for this appearance felt to be secondary consideration.  Clinical correlation recommended.  Please see above.  Critical Value/emergent results were called by telephone at the time of interpretation on 02/11/2012 at 6:24 p.m. to Dr. Denton Lank, who verbally acknowledged these results.  Original Report Authenticated By: Fuller Canada, M.D.    Assessment / Plan: 1.  Diverticular disease with intramural abscess  - IV Cipro and Flagyl  - will allow clear liquid diet  - OOB, ambulate  Velora Heckler, MD, [facility] Office: [phone]  02/12/2012

## 2012-02-13 ENCOUNTER — Telehealth: Payer: Self-pay | Admitting: Family Medicine

## 2012-02-13 DIAGNOSIS — K630 Abscess of intestine: Secondary | ICD-10-CM | POA: Diagnosis not present

## 2012-02-13 DIAGNOSIS — R1032 Left lower quadrant pain: Secondary | ICD-10-CM | POA: Diagnosis not present

## 2012-02-13 DIAGNOSIS — K5732 Diverticulitis of large intestine without perforation or abscess without bleeding: Secondary | ICD-10-CM | POA: Diagnosis not present

## 2012-02-13 NOTE — Telephone Encounter (Signed)
[facility] Triage Call Report Triage Record Num: [id number] Operator: Jacquenette Shone Patient Name: Sandra Craig Call Date & Time: 02/11/2012 11:53:12AM Patient Phone: [phone] PCP: Thomos Lemons Patient Gender: Female PCP Fax : [phone] Patient DOB: 03-29-1941 [facility] Name: [facility] Reason for Call: Caller: [place]/Patient; PCP: Allena Earing); CB#: [phone]; Call regarding Abd. pain , left sided. Onset 8pm last evening. Hx. of severe Diverticulosiss and Small bowel obstruction surg. approx. 2 yrs. ago - hx. of adhesion on left side. Reports abd. pain comes in waves approx. q 5 mins. Rated as a 10 when present. Nausea present. Denies fever. All emergent sxs. ruled out per Abdominal Pain with exception " pain described as deep, boring or tearing". Advised caller should be seen in ED and should have someone else drive her. States she was seen in past for bowel surg. at [facility] and will have someone drive her there. Protocol(s) Used: Abdominal Pain Recommended Outcome per Protocol: See ED Immediately Reason for Outcome: Pain described as deep, boring, or tearing Care Advice: ~ Do not give the patient anything to eat or drink. ~ Do not push on abdomen. ~ IMMEDIATE ACTION 07/

## 2012-02-13 NOTE — Progress Notes (Signed)
Agree with above 

## 2012-02-13 NOTE — Progress Notes (Signed)
Patient ID: Sandra Craig, female   DOB: 12/20/1940, 71 y.o.   MRN: [id number] Patient ID: Sandra Craig, female   DOB: [birth year], 71 y.o.   MRN: [id number]  General Surgery - [facility] - Progress Note  HD#3  Subjective: Pt denies any pain today.  Tolerated clear liquids well this am.  Denies fevers, chills.  Objective: Vital signs in last 24 hours: Temp:  [97.6 F (36.4 C)-99.1 F (37.3 C)] 99.1 F (37.3 C) (07/15 0549) Pulse Rate:  [63-85] 79  (07/15 0549) Resp:  [18] 18  (07/15 0549) BP: (100-109)/(50-63) 109/50 mmHg (07/15 0549) SpO2:  [93 %-97 %] 94 % (07/15 0549) Last BM Date: 02/10/12  Intake/Output from previous day: 07/14 0701 - 07/15 0700 In: 2729.6 [P.O.:420; I.V.:2309.6] Out: 900 [Urine:900]  Exam: HEENT - clear, not icteric Lungs - clear bilaterally Heart - RRR Abd - soft, non tender, +bs, non distended, no guarding  Ext - no significant edema Neuro - grossly intact, no focal deficits  Lab Results:   Basename 02/12/12 0850 02/11/12 2331  WBC 6.3 7.8  HGB 10.4* 11.0*  HCT 33.2* 34.5*  PLT 201 194     Basename 02/12/12 0850 02/11/12 1530  NA 142 142  K 3.8 3.9  CL 106 105  CO2 26 27  GLUCOSE 100* 94  BUN 13 18  CREATININE 1.19* 1.26*  CALCIUM 8.7 9.7    Studies/Results: Dg Chest 2 View  02/11/2012  *RADIOLOGY REPORT*  Clinical Data: Left-sided chest pain radiating to left arm. Shortness of breath.  Hypertension.  CHEST - 2 VIEW  Comparison: 06/10/2010  Findings: Heart size is normal.  Both lungs are clear.  No evidence of pleural effusion.  Mild pulmonary hyperinflation noted.  No mass or lymphadenopathy identified.  IMPRESSION: Stable exam.  No active disease.  Original Report Authenticated By: Danae Orleans, M.D.   Ct Abdomen Pelvis W Contrast  02/11/2012  *RADIOLOGY REPORT*  Clinical Data: Left lower quadrant pain.  History cholecystectomy and hysterectomy.  Inflammatory bowel disease.  History small bowel obstruction  and surgery.  CT ABDOMEN AND PELVIS WITH CONTRAST  Technique:  Multidetector CT imaging of the abdomen and pelvis was performed following the standard protocol during bolus administration of intravenous contrast.  Contrast: 65mL OMNIPAQUE IOHEXOL 300 MG/ML  SOLN .  Comparison: 06/09/2010  Findings: Sigmoid diverticulitis with focal abscess within the wall of the sigmoid colon measuring up to 1.5 cm (immediately adjacent to the bladder and distorting the left posterior lateral wall of the bladder).  Small amount of fluid within the pelvis. Distal sigmoid colon and rectal wall thickening consistent  with colitis type changes.  No free intraperitoneal air.  Contrast filled appendix with slightly thickened appearance.  The patient's symptoms are on the opposite side and therefore acute appendicitis as cause for this appearance felt to be secondary consideration.  Clinical correlation recommended.  Right hip replacement causes streak artifact through the pelvis.  Basilar subsegmental atelectatic changes/scarring.  Post cholecystectomy.  Mild prominence intrahepatic biliary ducts unchanged.  Prominence of the common bile duct stable.  Cyst arising from the inferior aspect of the medial segment of the left lobe liver has increased slightly in size measuring 4.2 x 3.4 x 4.1 cm versus prior 3.9 x 3.2 x 4 cm.  Post cholecystectomy.  No focal splenic, pancreatic or adrenal lesion.  No hydronephrosis.  There are multiple bilateral renal lesions some of which are simple cysts.  Others cannot be adequately characterized as simple  cysts.  These have changed minimally since prior examination and can be further assessed with elective renal MR if clinically desired.  Atherosclerotic type changes of the aorta without aneurysmal dilation.  Small lymph nodes within the pelvis may be reactive in origin.  No bony destructive lesion.  Spinal stenosis lower lumbar region.  IMPRESSION:  Sigmoid diverticulitis with focal abscess within the  wall of the sigmoid colon measuring up to 1.5 cm (immediately adjacent to the bladder and distorting the left posterior lateral wall of the bladder).  Small amount of fluid within the pelvis. Distal sigmoid colon and rectal wall thickening consistent  with colitis type changes.  Contrast filled appendix with slightly thickened appearance.  The patient's symptoms are on the opposite side and therefore acute appendicitis as cause for this appearance felt to be secondary consideration.  Clinical correlation recommended.  Please see above.  Critical Value/emergent results were called by telephone at the time of interpretation on 02/11/2012 at 6:24 p.m. to Dr. Denton Lank, who verbally acknowledged these results.  Original Report Authenticated By: Fuller Canada, M.D.    Assessment / Plan: 1.  Diverticular disease with intramural abscess  - IV Cipro and Flagyl, can possibly convert to po tomorrow  - will advance to full liquids  - OOB, ambulate  Zoeie Ritter, [facility] Office: [phone]  02/13/2012

## 2012-02-14 ENCOUNTER — Inpatient Hospital Stay (HOSPITAL_COMMUNITY): Payer: Medicare Other

## 2012-02-14 DIAGNOSIS — K5732 Diverticulitis of large intestine without perforation or abscess without bleeding: Secondary | ICD-10-CM | POA: Diagnosis not present

## 2012-02-14 DIAGNOSIS — R1032 Left lower quadrant pain: Secondary | ICD-10-CM | POA: Diagnosis not present

## 2012-02-14 DIAGNOSIS — R109 Unspecified abdominal pain: Secondary | ICD-10-CM | POA: Diagnosis not present

## 2012-02-14 DIAGNOSIS — L0291 Cutaneous abscess, unspecified: Secondary | ICD-10-CM | POA: Diagnosis not present

## 2012-02-14 DIAGNOSIS — K630 Abscess of intestine: Secondary | ICD-10-CM | POA: Diagnosis not present

## 2012-02-14 LAB — CBC
HCT: 32.1 % — ABNORMAL LOW (ref 36.0–46.0)
Hemoglobin: 9.9 g/dL — ABNORMAL LOW (ref 12.0–15.0)
MCH: 26.4 pg (ref 26.0–34.0)
MCHC: 30.8 g/dL (ref 30.0–36.0)
MCV: 85.6 fL (ref 78.0–100.0)
Platelets: 179 10*3/uL (ref 150–400)
RBC: 3.75 MIL/uL — ABNORMAL LOW (ref 3.87–5.11)
RDW: 17.1 % — ABNORMAL HIGH (ref 11.5–15.5)
WBC: 4.7 10*3/uL (ref 4.0–10.5)

## 2012-02-14 MED ORDER — IOHEXOL 300 MG/ML  SOLN
100.0000 mL | Freq: Once | INTRAMUSCULAR | Status: AC | PRN
  Administered 2012-02-14: 100 mL via INTRAVENOUS

## 2012-02-14 MED ORDER — PANTOPRAZOLE SODIUM 40 MG PO TBEC
40.0000 mg | DELAYED_RELEASE_TABLET | Freq: Every day | ORAL | Status: DC
  Administered 2012-02-14 – 2012-02-16 (×3): 40 mg via ORAL
  Filled 2012-02-14 (×3): qty 1

## 2012-02-14 MED ORDER — IOHEXOL 300 MG/ML  SOLN
20.0000 mL | INTRAMUSCULAR | Status: AC
  Administered 2012-02-14 (×2): 20 mL via ORAL

## 2012-02-14 MED ORDER — SODIUM CHLORIDE 0.9 % IV SOLN
1.0000 g | INTRAVENOUS | Status: DC
  Administered 2012-02-14 – 2012-02-15 (×2): 1 g via INTRAVENOUS
  Filled 2012-02-14 (×3): qty 1

## 2012-02-14 NOTE — Progress Notes (Signed)
She has worse pain today, llq tenderness on exam she states is same as when she came in, will repeat ct today to ensure there is nothing to drain now and consider changing abx

## 2012-02-14 NOTE — ED Provider Notes (Signed)
I saw and evaluated the patient, reviewed the resident's note and I agree with the findings and plan. Pt w mid to lower abd pain. +tenderness on exam. Ct.   Suzi Roots, MD 02/14/12 [phone]

## 2012-02-14 NOTE — Progress Notes (Signed)
CT reviewed.  Still with worse pain in llq although ct somewhat better.  Wbc normal.  Will change abx to ertapenem today to see if this helps.  I don't think this can be drained but if not better tomorrow will discuss with ir.

## 2012-02-14 NOTE — Progress Notes (Signed)
Patient ID: Sandra Craig, female   DOB: [birth year], 71 y.o.   MRN: [id number]   General Surgery - [facility] - Progress Note  HD#4  Subjective: Pt reports increased pain last night and required pain meds.  No nausea or vomiting.  Denies fever or chills.  More tender in LLQ this am but not severe just with palp.  Objective: Vital signs in last 24 hours: Temp:  [98 F (36.7 C)-98.3 F (36.8 C)] 98.2 F (36.8 C) (07/16 0543) Pulse Rate:  [67-97] 67  (07/16 0543) Resp:  [18] 18  (07/16 0543) BP: (100-115)/(52-102) 100/55 mmHg (07/16 0543) SpO2:  [95 %-97 %] 95 % (07/16 0543) Last BM Date: 02/13/12  Intake/Output from previous day: 07/15 0701 - 07/16 0700 In: 2232 [P.O.:960; I.V.:672; IV Piggyback:600] Out: -   Exam: HEENT - clear, not icteric Lungs - clear bilaterally Heart - RRR Abd - soft, very tender in LLQ with guarding +bs, ?mass in LLQ  Ext - no significant edema Neuro - grossly intact, no focal deficits  Lab Results:   [facility] 02/12/12 0850 02/11/12 2331  WBC 6.3 7.8  HGB 10.4* 11.0*  HCT 33.2* 34.5*  PLT 201 194     Basename 02/12/12 0850 02/11/12 1530  NA 142 142  K 3.8 3.9  CL 106 105  CO2 26 27  GLUCOSE 100* 94  BUN 13 18  CREATININE 1.19* 1.26*  CALCIUM 8.7 9.7    Studies/Results: No results found.  Assessment / Plan: 1.  Diverticular disease with intramural abscess  - IV Cipro and Flagyl, keep today due to increase pain and palp mass, also recheck CBC  - keep on full liquids  - OOB, ambulate  WHITE, [facility] Office: [phone]  02/14/2012

## 2012-02-15 MED ORDER — OXYCODONE-ACETAMINOPHEN 5-325 MG PO TABS
1.0000 | ORAL_TABLET | ORAL | Status: DC | PRN
Start: 2012-02-15 — End: 2012-02-17
  Administered 2012-02-15 – 2012-02-16 (×3): 2 via ORAL
  Filled 2012-02-15 (×5): qty 2

## 2012-02-15 MED ORDER — LOSARTAN POTASSIUM 50 MG PO TABS
50.0000 mg | ORAL_TABLET | Freq: Every day | ORAL | Status: DC
  Administered 2012-02-15 – 2012-02-16 (×2): 50 mg via ORAL
  Filled 2012-02-15 (×5): qty 1

## 2012-02-15 NOTE — Care Management Note (Signed)
  Page 1 of 1   02/15/2012     11:12:20 AM   CARE MANAGEMENT NOTE 02/15/2012  Patient:  Sandra Craig, Sandra Craig   Account Number:  [id number]  Date Initiated:  02/15/2012  Documentation initiated by:  Ronny Flurry  Subjective/Objective Assessment:   DX: Sigmoid diverticulitis with intramural colonic wall abscess    Invanz  another 24 hours    02-15-12 PA note : IV Cipro and Flagyl, can possibly convert to po tomorrow              - will advance to full liquids     Action/Plan:   Anticipated DC Date:  02/16/2012   Anticipated DC Plan:  HOME/SELF CARE         Choice offered to / List presented to:             Status of service:  In process, will continue to follow Medicare Important Message given?   (If response is "NO", the following Medicare IM given date fields will be blank) Date Medicare IM given:   Date Additional Medicare IM given:    Discharge Disposition:    Per UR Regulation:  Reviewed for med. necessity/level of care/duration of stay  If discussed at Long Length of Stay Meetings, dates discussed:    Comments:

## 2012-02-15 NOTE — Progress Notes (Signed)
Patient ID: Sandra Craig, female   DOB: 07/13/1941, 71 y.o.   MRN: [id number]   General Surgery - [facility] - Progress Note  HD#5  Subjective: Pt feels much better today, only slightly tender in LLQ now, tolerating full liquids well, denies fevers, chills, n/v  Objective: Vital signs in last 24 hours: Temp:  [97.3 F (36.3 C)-98.8 F (37.1 C)] 98.8 F (37.1 C) (07/17 0615) Pulse Rate:  [67-80] 80  (07/17 0615) Resp:  [16-18] 16  (07/17 0615) BP: (100-116)/(52-63) 116/52 mmHg (07/17 0615) SpO2:  [94 %-100 %] 94 % (07/17 0615) Last BM Date: 02/14/12  Intake/Output from previous day: 07/16 0701 - 07/17 0700 In: 1437.5 [P.O.:600; I.V.:837.5] Out: 3 [Urine:3]  Exam: HEENT - clear, not icteric Lungs - clear bilaterally Heart - RRR Abd - soft, only mildly tender in LLQ, +BS Ext - no significant edema Neuro - grossly intact, no focal deficits  Lab Results:   Basename 02/14/12 1042 02/12/12 0850  WBC 4.7 6.3  HGB 9.9* 10.4*  HCT 32.1* 33.2*  PLT 179 201     Basename 02/12/12 0850  NA 142  K 3.8  CL 106  CO2 26  GLUCOSE 100*  BUN 13  CREATININE 1.19*  CALCIUM 8.7    Studies/Results: Ct Abdomen Pelvis W Contrast  02/14/2012  *RADIOLOGY REPORT*  Clinical Data: Diverticulitis.  Worsening abdominal pain.  CT ABDOMEN AND PELVIS WITH CONTRAST  Technique:  Multidetector CT imaging of the abdomen and pelvis was performed following the standard protocol during bolus administration of intravenous contrast.  Contrast: OMNIPAQUE IOHEXOL 300 MG/ML  SOLN  Comparison: CT scan dated 02/11/2012  Findings: Again noted is acute diverticulitis of the mid sigmoid colon at the left posterior lateral aspect of the bladder.  There is a phlegmon without discrete abscess, which has slightly diminished in size since the prior study.  There is no free air. There is a small amount of free fluid in the pelvis which has minimally increased.  Small amount of contrast is  seen going into the phlegmon at the site of the ruptured diverticulum.  There is a benign appearance of the remainder of the abdomen. Previous cholecystectomy with slight biliary ductal dilatation, unchanged.  Benign cyst in the left lobe of the liver.  Multiple benign-appearing cysts in both kidneys.  No acute osseous abnormality.  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  Right total hip prosthesis.  IMPRESSION:  1.  Slight improvement in the acute diverticulitis and phlegmon in the pelvis. 2.  Slight increase in small amount of free fluid in the pelvis.  Original Report Authenticated By: Gwynn Burly, M.D.    Assessment / Plan: 1.  Diverticular disease with intramural abscess  - IV Invanz now, CT showed some improvement yesterday and pt better today, will advance diet slowly today, keep on IV abx today, WBC normal.  - OOB, ambulate  WHITE, [facility] Office: [phone]  02/15/2012

## 2012-02-15 NOTE — Progress Notes (Signed)
Pt admitted with abdominal pain r/t diverticular flair.   RD consult received for diet education.  Nutrition dx:  Nutrition-related knowledge deficit r/t diet therapy AEB MD/nursing request  Intervention:  Brief education;  Provided for pt and husband.  Goals of nutrition therapy discussed including transitioning from low fiber to high fiber diet and long-term management.  Understanding confirmed.  RD contact information provided.  Monitoring:  Knowledge; for questions.  Please consult RD if new questions present.  Pager:  [phone]

## 2012-02-15 NOTE — Progress Notes (Signed)
Feels better, still with some mild llq tenderness but improved, agree with advancing diet, continue invanz another 24 hours

## 2012-02-16 DIAGNOSIS — K630 Abscess of intestine: Secondary | ICD-10-CM | POA: Diagnosis not present

## 2012-02-16 DIAGNOSIS — R1032 Left lower quadrant pain: Secondary | ICD-10-CM | POA: Diagnosis not present

## 2012-02-16 DIAGNOSIS — K5732 Diverticulitis of large intestine without perforation or abscess without bleeding: Secondary | ICD-10-CM | POA: Diagnosis not present

## 2012-02-16 MED ORDER — CIPROFLOXACIN HCL 500 MG PO TABS
500.0000 mg | ORAL_TABLET | Freq: Two times a day (BID) | ORAL | Status: DC
  Administered 2012-02-16 – 2012-02-17 (×3): 500 mg via ORAL
  Filled 2012-02-16 (×5): qty 1

## 2012-02-16 MED ORDER — METRONIDAZOLE 500 MG PO TABS
500.0000 mg | ORAL_TABLET | Freq: Two times a day (BID) | ORAL | Status: DC
  Administered 2012-02-16 – 2012-02-17 (×3): 500 mg via ORAL
  Filled 2012-02-16 (×4): qty 1

## 2012-02-16 NOTE — Progress Notes (Signed)
Patient ID: Sandra Craig, female   DOB: 09/01/1940, 70 y.o.   MRN: [id number] Patient ID: Sandra Craig, female   DOB: April 27, 1941, 71 y.o.   MRN: [id number]   General Surgery - [facility] - Progress Note  HD#6  Subjective: Pt continues to feel better although is having some cramping in the lower abdomen.  Not like the other night, no severe pain.  Denies n/v, fevers or chills.  Tolerating diet well.    Objective: Vital signs in last 24 hours: Temp:  [97.4 F (36.3 C)-97.7 F (36.5 C)] 97.4 F (36.3 C) (07/18 0534) Pulse Rate:  [63-71] 63  (07/18 0534) Resp:  [16-18] 16  (07/18 0534) BP: (92-119)/(53-71) 104/57 mmHg (07/18 0534) SpO2:  [96 %-99 %] 96 % (07/18 0534) Last BM Date: 02/14/12  Intake/Output from previous day: 07/17 0701 - 07/18 0700 In: 1710.4 [P.O.:480; I.V.:1230.4] Out: -   Exam: HEENT - clear, not icteric Lungs - clear bilaterally Heart - RRR Abd - soft, only mildly tender in LLQ and this is less then yesterday, +BS Ext - no significant edema Neuro - grossly intact, no focal deficits  Lab Results:   Basename 02/14/12 1042  WBC 4.7  HGB 9.9*  HCT 32.1*  PLT 179    No results found for this basename: NA:2,K:2,CL:2,CO2:2,GLUCOSE:2,BUN:2,CREATININE:2,CALCIUM:2 in the last 72 hours  Studies/Results: Ct Abdomen Pelvis W Contrast  02/14/2012  *RADIOLOGY REPORT*  Clinical Data: Diverticulitis.  Worsening abdominal pain.  CT ABDOMEN AND PELVIS WITH CONTRAST  Technique:  Multidetector CT imaging of the abdomen and pelvis was performed following the standard protocol during bolus administration of intravenous contrast.  Contrast: OMNIPAQUE IOHEXOL 300 MG/ML  SOLN  Comparison: CT scan dated 02/11/2012  Findings: Again noted is acute diverticulitis of the mid sigmoid colon at the left posterior lateral aspect of the bladder.  There is a phlegmon without discrete abscess, which has slightly diminished in size since the prior study.  There  is no free air. There is a small amount of free fluid in the pelvis which has minimally increased.  Small amount of contrast is seen going into the phlegmon at the site of the ruptured diverticulum.  There is a benign appearance of the remainder of the abdomen. Previous cholecystectomy with slight biliary ductal dilatation, unchanged.  Benign cyst in the left lobe of the liver.  Multiple benign-appearing cysts in both kidneys.  No acute osseous abnormality.  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  Right total hip prosthesis.  IMPRESSION:  1.  Slight improvement in the acute diverticulitis and phlegmon in the pelvis. 2.  Slight increase in small amount of free fluid in the pelvis.  Original Report Authenticated By: Gwynn Burly, M.D.    Assessment / Plan: 1.  Diverticular disease with intramural abscess  - Will dc IV invanz and start PO cipro and flagyl, advance diet, she continues to improve clinically today, possibly home tomorrow on PO abx for 2 weeks if improving.  Nutrition has seen the patient and done education about her diet.  - OOB, ambulate  Emery Binz, [facility] Office: [phone]  02/16/2012

## 2012-02-16 NOTE — Progress Notes (Signed)
Agree with above 

## 2012-02-17 DIAGNOSIS — K630 Abscess of intestine: Secondary | ICD-10-CM | POA: Diagnosis not present

## 2012-02-17 DIAGNOSIS — R1032 Left lower quadrant pain: Secondary | ICD-10-CM | POA: Diagnosis not present

## 2012-02-17 DIAGNOSIS — K5732 Diverticulitis of large intestine without perforation or abscess without bleeding: Secondary | ICD-10-CM | POA: Diagnosis not present

## 2012-02-17 MED ORDER — CIPROFLOXACIN HCL 500 MG PO TABS: 500.0000 mg | ORAL_TABLET | Freq: Two times a day (BID) | ORAL | Status: AC

## 2012-02-17 MED ORDER — METRONIDAZOLE 500 MG PO TABS: 500.0000 mg | ORAL_TABLET | Freq: Three times a day (TID) | ORAL | Status: AC

## 2012-02-17 MED ORDER — CIPROFLOXACIN HCL 500 MG PO TABS: 500.0000 mg | ORAL_TABLET | Freq: Two times a day (BID) | ORAL | Status: DC

## 2012-02-17 MED ORDER — OXYCODONE-ACETAMINOPHEN 5-325 MG PO TABS: 1.0000 | ORAL_TABLET | ORAL | Status: DC | PRN

## 2012-02-17 MED ORDER — METRONIDAZOLE 500 MG PO TABS: 500.0000 mg | ORAL_TABLET | Freq: Two times a day (BID) | ORAL | Status: DC

## 2012-02-17 MED ORDER — OXYCODONE-ACETAMINOPHEN 10-325 MG PO TABS: 1.0000 | ORAL_TABLET | Freq: Four times a day (QID) | ORAL | Status: DC | PRN

## 2012-02-17 NOTE — Discharge Summary (Signed)
Physician Discharge Summary  Patient ID: Sandra Craig MRN: [id number] DOB/AGE: [birth year] 71 y.o.  Admit date: 02/11/2012 Discharge date: 02/17/2012  Admitting Diagnosis: Acute Diverticulitis  Discharge Diagnosis Patient Active Problem List   Diagnosis Date Noted  . Diverticulitis large intestine 02/11/2012  . Irritable bowel syndrome (IBS) 06/28/2011  . History of small bowel obstruction 06/28/2011  . 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 Dysphagia 06/20/2011  . Polycystic kidney disease   . IBS (irritable bowel syndrome)   . Hypertension   . Hyperlipidemia   . IC (interstitial cystitis)   . Endometriosis   . OSTEOPENIA 12/21/2009  . HOT FLASHES 04/22/2009  . ANKLE PAIN, LEFT 04/02/2009  . THROMBOCYTOPENIA 03/10/2009  . ANEMIA 10/08/2008  . INSOMNIA, CHRONIC 10/08/2008  . HEMORRHOIDS 10/08/2008  . HIATAL HERNIA 10/08/2008  . DIVERTICULOSIS, COLON 10/08/2008  . POLYCYSTIC KIDNEY DISEASE 09/16/2008  . HEADACHE 09/16/2008  . BENIGN NEOPLASM OF LIVER AND BILIARY PASSAGES 07/09/2008  . ACQUIRED CYST OF KIDNEY 07/09/2008  . HEMATURIA UNSPECIFIED 07/04/2008  . PELVIC  PAIN 07/04/2008  . IRRITABLE BOWEL SYNDROME 07/02/2008  . CHRONIC RHINITIS 03/27/2008  . DEPRESSION 02/08/2008  . FIBROMYALGIA 12/05/2007  . FATIGUE 11/29/2007  . GERD 09/13/2007  . Lung nodules 08/09/2007  . HYPERLIPIDEMIA 05/28/2007  . Unspecified Essential Hypertension 05/28/2007    Consultants None  Procedures None  Hospital Course: 71 yr old female with known history of diverticulosis but no history of diverticulitis presented to the [facility] with several day history of abdominal pain.  Evaluation showed that she had diverticulitis with an intramural abscess.  She was admitted, made NPO and started on cipro and flagyl.  She initially had significant improvement in her symptoms but then had severe pain one night.  We repeated her CT scan due to this which actually showed improvement in the abscess.  We  changed her antibiotics to invanz and slowly advanced her diet.  She overall clinically improved although she did have one more episode of pain on Thursday night, although not as severe as before.  Her clinical exam was still showing improvement overall and she was not as tender in the LLQ as before.  She was afebrile and her WBC was normal.  She had been switched to PO cipro and Flagyl.  It was felt that she could be discharged home on PO antibiotics for 2 weeks.  Nutrition saw the patient as well and gave her instructions for her diet.    Medication List  As of 02/17/2012  9:54 AM   TAKE these medications         CALCIUM + D PO   Take 2 tablets by mouth daily. 1200 OF CALCIUM +D      ciprofloxacin 500 MG tablet   Commonly known as: CIPRO   Take 1 tablet (500 mg total) by mouth 2 (two) times daily. One po bid x 7 days      ciprofloxacin 500 MG tablet   Commonly known as: CIPRO   Take 1 tablet (500 mg total) by mouth 2 (two) times daily.      gabapentin 100 MG capsule   Commonly known as: NEURONTIN   Take 100 mg by mouth daily.      losartan 50 MG tablet   Commonly known as: COZAAR   Take 50 mg by mouth daily.      metroNIDAZOLE 500 MG tablet   Commonly known as: FLAGYL   Take 1 tablet (500 mg total) by mouth 3 (three) times daily.  metroNIDAZOLE 500 MG tablet   Commonly known as: FLAGYL   Take 1 tablet (500 mg total) by mouth every 12 (twelve) hours.      multivitamin capsule   Take 1 capsule by mouth daily.      oxyCODONE-acetaminophen 5-325 MG per tablet   Commonly known as: PERCOCET/ROXICET   Take 2 tablets by mouth every 4 (four) hours as needed for pain.      oxyCODONE-acetaminophen 5-325 MG per tablet   Commonly known as: PERCOCET/ROXICET   Take 1-2 tablets by mouth every 4 (four) hours as needed.      polyethylene glycol packet   Commonly known as: MIRALAX / GLYCOLAX   Take 17 g by mouth as needed. For constipation.      rosuvastatin 20 MG tablet    Commonly known as: CRESTOR   Take 20 mg by mouth daily.      sertraline 100 MG tablet   Commonly known as: ZOLOFT   Take 100 mg by mouth daily.      zolpidem 10 MG tablet   Commonly known as: AMBIEN   Take 10 mg by mouth at bedtime as needed. For sleep.             Follow-up Information    Follow up with [facility], [place]. Call in 3 weeks. (Please call our office to schedule an appointment to see your surgeon in 3 weeks)    Contact information:   [facility] [address] [place] [address] [phone]          Signed: Denny Levy [facility] [phone]  02/17/2012, 9:54 AM

## 2012-02-17 NOTE — Progress Notes (Signed)
She is tolerating diet and having bms/passing flatus, wbc is nl, afebrile, she still has some mild residual pain but I think reasonable to attempt to treat as outpt now. We discussed signs to return for and possible failure.  If she does well will see in 2 weeks in office. She will find out when last csc was as well

## 2012-02-27 ENCOUNTER — Encounter (HOSPITAL_COMMUNITY): Payer: Self-pay | Admitting: Respiratory Therapy

## 2012-02-28 ENCOUNTER — Other Ambulatory Visit: Payer: Self-pay | Admitting: Orthopedic Surgery

## 2012-03-09 ENCOUNTER — Other Ambulatory Visit (HOSPITAL_COMMUNITY): Payer: Medicare Other

## 2012-03-12 ENCOUNTER — Inpatient Hospital Stay: Admit: 2012-03-12 | Payer: Self-pay | Admitting: Orthopedic Surgery

## 2012-03-12 ENCOUNTER — Other Ambulatory Visit: Payer: Self-pay | Admitting: *Deleted

## 2012-03-12 SURGERY — HEMIARTHROPLASTY, SHOULDER
Anesthesia: General | Laterality: Right

## 2012-03-12 MED ORDER — ZOLPIDEM TARTRATE 10 MG PO TABS: 10.0000 mg | ORAL_TABLET | Freq: Every evening | ORAL | Status: DC | PRN

## 2012-03-13 DIAGNOSIS — F39 Unspecified mood [affective] disorder: Secondary | ICD-10-CM | POA: Diagnosis not present

## 2012-03-21 ENCOUNTER — Telehealth (INDEPENDENT_AMBULATORY_CARE_PROVIDER_SITE_OTHER): Payer: Self-pay

## 2012-03-21 NOTE — Telephone Encounter (Signed)
Returned pt's call about having diagnosed herself with proctitits due to rectal bleeding x 1wk. The pt has done some research on line with her symptoms. The pt has an appt with Dr Dwain Sarna on Friday 03/23/12 to be rechecked from last month hospital visit with divierticulitis. The pt was concerned about the rectal bleeding that she called to see if she could be seen sooner with Dr Dwain Sarna and to see if there was anything she should be doning before the appt. I spoke to Dr Dwain Sarna who advised pt that he thinks this sounds like a flare up of her hemorrhoids and she could just wait to see him on Friday. If the pt feels she can't wait till Friday then we would work her into to see one of Dr Doreen Salvage partners. The pt was instructed on the advise and she feels ok with waiting to see Dr Dwain Sarna.

## 2012-03-23 ENCOUNTER — Encounter (INDEPENDENT_AMBULATORY_CARE_PROVIDER_SITE_OTHER): Payer: Self-pay | Admitting: General Surgery

## 2012-03-23 ENCOUNTER — Ambulatory Visit (INDEPENDENT_AMBULATORY_CARE_PROVIDER_SITE_OTHER): Payer: Medicare Other | Admitting: General Surgery

## 2012-03-23 VITALS — BP 120/64 | HR 70 | Resp 16 | Ht 66.0 in | Wt 136.0 lb

## 2012-03-23 DIAGNOSIS — K5730 Diverticulosis of large intestine without perforation or abscess without bleeding: Secondary | ICD-10-CM

## 2012-03-23 DIAGNOSIS — K648 Other hemorrhoids: Secondary | ICD-10-CM | POA: Diagnosis not present

## 2012-03-23 MED ORDER — HYDROCORTISONE 2.5 % RE CREA: TOPICAL_CREAM | Freq: Two times a day (BID) | RECTAL | Status: AC

## 2012-03-23 NOTE — Patient Instructions (Signed)

## 2012-03-23 NOTE — Progress Notes (Signed)
Subjective:     Patient ID: Sandra Craig, female   DOB: 10-18-1940, 71 y.o.   MRN: [id number]  HPI This is a 71 year old female who was admitted to the hospital with diverticulitis of her sigmoid colon and an intramural abscess. She eventually got better with intravenous antibiotics and was discharged home. She returns today feeling back to her normal self prior to the hospital admission. She does report she is having some of her stools. She also reports that she had some bright red blood that has been associated with some constipation. This is not happening anymore. She comes in today for followup.  Review of Systems     Objective:   Physical Exam Abdomen is nontender, soft External examination of her anus shows some small tags but otherwise has no abnormalities there is no fissure present    Assessment:     Diverticular disease of the sigmoid colon Likely internal hemorrhoids    Plan:     We discussed conservative measures for hemorrhoids and also prescribed her some steroid suppositories today. She's going to try these in call me if she has any other issues. I am also going to refer her for a repeat colonoscopy given the recent episode of diverticulitis, that her stools, and blood. I will plan on seeing her after both of these. She has previously been seen by Dr. Jarold Motto and would like to change out in touch with the gastroenterologist about that.

## 2012-03-29 ENCOUNTER — Telehealth: Payer: Self-pay | Admitting: Gastroenterology

## 2012-03-29 ENCOUNTER — Telehealth (INDEPENDENT_AMBULATORY_CARE_PROVIDER_SITE_OTHER): Payer: Self-pay

## 2012-03-29 NOTE — Telephone Encounter (Signed)
Dr Jarold Motto, ok for pt to switch to Dr Juanda Chance?

## 2012-03-29 NOTE — Telephone Encounter (Signed)
I tried making an appt for pt to see Dr Juanda Chance to get a repeat colonoscopy with endo requesting Dr Juanda Chance since the pt normally see's Dr Jarold Motto. Their front desk said they have to put a note to Dr Jarold Motto asking for him to release the pt to Dr Juanda Chance and Dr Juanda Chance has to accept the pt before we can schedule anything. Dr Regino Schultze schedule is booked already up to November so I will wait to hear from their office about making the appt. I will call the pt to let her know.

## 2012-03-30 NOTE — Telephone Encounter (Signed)
Dr Brodie, will you accept this pt? Thanks. 

## 2012-03-30 NOTE — Telephone Encounter (Signed)
Sandra Craig if she accepts

## 2012-04-02 NOTE — Telephone Encounter (Signed)
OK with me.

## 2012-04-03 NOTE — Telephone Encounter (Signed)
Please schedule when needed. Thanks.

## 2012-04-05 ENCOUNTER — Encounter: Payer: Self-pay | Admitting: Internal Medicine

## 2012-04-05 ENCOUNTER — Telehealth (INDEPENDENT_AMBULATORY_CARE_PROVIDER_SITE_OTHER): Payer: Self-pay

## 2012-04-05 DIAGNOSIS — K5792 Diverticulitis of intestine, part unspecified, without perforation or abscess without bleeding: Secondary | ICD-10-CM

## 2012-04-05 NOTE — Telephone Encounter (Signed)
Called Sandra Craig to give her the appt with Dr Juanda Chance for 9/17 arrive at 9:15 to discuss having colonoscopy/endoscopy for diverticulitis and hems. The Sandra Craig understands.

## 2012-04-06 NOTE — Telephone Encounter (Signed)
Called pt to let her know we have her scheduled to see DR Juanda Chance on 04/17/12 to discuss colonoscopy w/endo.

## 2012-04-10 DIAGNOSIS — M76899 Other specified enthesopathies of unspecified lower limb, excluding foot: Secondary | ICD-10-CM | POA: Diagnosis not present

## 2012-04-10 DIAGNOSIS — Z471 Aftercare following joint replacement surgery: Secondary | ICD-10-CM | POA: Diagnosis not present

## 2012-04-10 DIAGNOSIS — M719 Bursopathy, unspecified: Secondary | ICD-10-CM | POA: Diagnosis not present

## 2012-04-12 ENCOUNTER — Encounter: Payer: Self-pay | Admitting: *Deleted

## 2012-04-16 ENCOUNTER — Telehealth: Payer: Self-pay | Admitting: Internal Medicine

## 2012-04-16 NOTE — Telephone Encounter (Signed)
Forward 3 pages from Delbert Harness to [name] for review on 04-16-12 ym

## 2012-04-17 ENCOUNTER — Encounter: Payer: Self-pay | Admitting: Internal Medicine

## 2012-04-17 ENCOUNTER — Ambulatory Visit (INDEPENDENT_AMBULATORY_CARE_PROVIDER_SITE_OTHER): Payer: Medicare Other | Admitting: Internal Medicine

## 2012-04-17 VITALS — BP 110/74 | HR 72 | Ht 65.5 in | Wt 137.0 lb

## 2012-04-17 DIAGNOSIS — R1319 Other dysphagia: Secondary | ICD-10-CM | POA: Diagnosis not present

## 2012-04-17 DIAGNOSIS — K5732 Diverticulitis of large intestine without perforation or abscess without bleeding: Secondary | ICD-10-CM | POA: Diagnosis not present

## 2012-04-17 MED ORDER — MOVIPREP 100 G PO SOLR: ORAL | Status: DC

## 2012-04-17 NOTE — Patient Instructions (Addendum)
You have been scheduled for an endoscopy and colonoscopy. Please follow the written instructions given to you at your visit today. Please pick up your prep at the pharmacy within the next 1-3 days. If you use inhalers (even only as needed), please bring them with you on the day of your procedure. CC: Dr Artist Pais, Dr Dwain Sarna, Dr Thurston Hole

## 2012-04-17 NOTE — Progress Notes (Signed)
Sandra Craig 02-22-1941 MRN [id number]   History of Present Illness:  This is a 71 year old white female who is post hospitalization for acute diverticulitis with confined perforation. She had intramural abscess which healed with IV antibiotics and bowel rest. She had her last colonoscopy in May 2010 which showed severe diverticulosis. She also has a history of dyspepsia and had an endoscopy in May 2010 which showed mild gastritis. She was hospitalized for a small bowel obstruction about 2 years ago. The obstruction was due to adhesive disease and she underwent lysis of adhesions. She is currently awaiting right shoulder replacement but has to get clearance for her diverticulitis . She already had right shoulder surgery and right hip replacement in the past. She is interested in moving and had shoulder surgery as soon as possible because of the constant pain. She is not on any anti-inflammatory agents. She has occasional dysphagia to solids.    Past Medical History  Diagnosis Date  . IBS (irritable bowel syndrome)   . Hypertension   . Hyperlipidemia   . IC (interstitial cystitis)   . Endometriosis   . Polycystic kidney disease   . Osteopenia   . Cystitis   . Fibromyalgia   . Palpitations   . Chronic insomnia   . Pulmonary nodule 12/08    5 mm Anterior RUL  . Hiatal hernia   . Diverticulosis of colon (without mention of hemorrhage)   . Internal hemorrhoid   . Family history of malignant neoplasm of gastrointestinal tract   . Gastritis   . Small bowel obstruction   . Osteonecrosis    Past Surgical History  Procedure Date  . Cholecystectomy   . Pelvic laparoscopy   . Abdominal hysterectomy 1994    TAH,BSO FOR ENDOMETRIOSIS  . Oophorectomy 1994    TAH,BSO  . Total hip arthroplasty FALL OF 2008    rt. partial hip replacement  . Abdominal surgery 2011    small intestine blockage  . S/p right shoulder rotater cuff 200216/2011    Tear/adhesive capsulitis  . Sbo lap 11   Adhesions and small internal hernia    reports that she has quit smoking. She has never used smokeless tobacco. She reports that she does not drink alcohol or use illicit drugs. family history includes COPD in her father; Colon cancer in her paternal grandmother; Emphysema in her mother; Heart disease in her mother; Lymphoma in her maternal grandmother; and Thyroid disease in her mother. Allergies  Allergen Reactions  . Celecoxib   . Hydrocodone   . Prednisone Other (See Comments)    Made pt feel "crazy"  . Sulfonamide Derivatives         Review of Systems: Regular bowel habits while taking Metamucil. Denies fever abdominal pain  The remainder of the 10 point ROS is negative except as outlined in H&P   Physical Exam: General appearance  Well developed, in no distress. Eyes- non icteric. HEENT nontraumatic, normocephalic. Mouth no lesions, tongue papillated, no cheilosis. Neck supple without adenopathy, thyroid not enlarged, no carotid bruits, no JVD. Lungs Clear to auscultation bilaterally. Cor normal S1, normal S2, regular rhythm, no murmur,  quiet precordium. Abdomen: Soft minimal tenderness in epigastrium. Normal active bowel sounds. No distention. Left lower quadrant unremarkable. Well-healed surgical scars. Rectal: Not done. Extremities no pedal edema. Skin no lesions. Neurological alert and oriented x 3. Psychological normal mood and affect.  Assessment and Plan:  Problem #1 Patient is post first episode of diverticulitis complicated by intramural abscess which responded  to conservative measures. She has recovered and is back to her baseline. She has known severe diverticulosis of the sigmoid colon from a prior colonoscopy in 2010. Dr. Dwain Craig has discussed a possible sigmoid resection with her. She would like to have her shoulder replaced first and then consider further options for diverticular disease. Dr. Dwain Craig recommended an endoscopy and colonoscopy before her  shoulder surgery. We will go ahead and schedule it. She will continue Metamucil 1 teaspoon daily and a 20 g daily fiber diet. Problem #2 frozen shoulder- followed by Dr Sandra Craig  04/17/2012 Sandra Craig

## 2012-04-29 NOTE — Interval H&P Note (Signed)
History and Physical Interval Note:  04/29/2012 8:59 PM  Sandra Craig  has presented today for surgery, with the diagnosis of screening  The various methods of treatment have been discussed with the patient and family. After consideration of risks, benefits and other options for treatment, the patient has consented to  Procedure(s) (LRB) with comments: COLONOSCOPY, ESOPHAGOGASTRODUODENOSCOPY (EGD) AND ESOPHAGEAL DILATION (N/A) as a surgical intervention .  The patient's history has been reviewed, patient examined, no change in status, stable for surgery.  I have reviewed the patient's chart and labs.  Questions were answered to the patient's satisfaction.     [name]

## 2012-04-29 NOTE — H&P (View-Only) (Signed)
Sandra Craig 12/31/1940 MRN [id number]   History of Present Illness:  This is a 70-year-old white female who is post hospitalization for acute diverticulitis with confined perforation. She had intramural abscess which healed with IV antibiotics and bowel rest. She had her last colonoscopy in May 2010 which showed severe diverticulosis. She also has a history of dyspepsia and had an endoscopy in May 2010 which showed mild gastritis. She was hospitalized for a small bowel obstruction about 2 years ago. The obstruction was due to adhesive disease and she underwent lysis of adhesions. She is currently awaiting right shoulder replacement but has to get clearance for her diverticulitis . She already had right shoulder surgery and right hip replacement in the past. She is interested in moving and had shoulder surgery as soon as possible because of the constant pain. She is not on any anti-inflammatory agents. She has occasional dysphagia to solids.    Past Medical History  Diagnosis Date  . IBS (irritable bowel syndrome)   . Hypertension   . Hyperlipidemia   . IC (interstitial cystitis)   . Endometriosis   . Polycystic kidney disease   . Osteopenia   . Cystitis   . Fibromyalgia   . Palpitations   . Chronic insomnia   . Pulmonary nodule 12/08    5 mm Anterior RUL  . Hiatal hernia   . Diverticulosis of colon (without mention of hemorrhage)   . Internal hemorrhoid   . Family history of malignant neoplasm of gastrointestinal tract   . Gastritis   . Small bowel obstruction   . Osteonecrosis    Past Surgical History  Procedure Date  . Cholecystectomy   . Pelvic laparoscopy   . Abdominal hysterectomy 1994    TAH,BSO FOR ENDOMETRIOSIS  . Oophorectomy 1994    TAH,BSO  . Total hip arthroplasty FALL OF 2008    rt. partial hip replacement  . Abdominal surgery 2011    small intestine blockage  . S/p right shoulder rotater cuff 200216/2011    Tear/adhesive capsulitis  . Sbo lap 11   Adhesions and small internal hernia    reports that she has quit smoking. She has never used smokeless tobacco. She reports that she does not drink alcohol or use illicit drugs. family history includes COPD in her father; Colon cancer in her paternal grandmother; Emphysema in her mother; Heart disease in her mother; Lymphoma in her maternal grandmother; and Thyroid disease in her mother. Allergies  Allergen Reactions  . Celecoxib   . Hydrocodone   . Prednisone Other (See Comments)    Made pt feel "crazy"  . Sulfonamide Derivatives         Review of Systems: Regular bowel habits while taking Metamucil. Denies fever abdominal pain  The remainder of the 10 point ROS is negative except as outlined in H&P   Physical Exam: General appearance  Well developed, in no distress. Eyes- non icteric. HEENT nontraumatic, normocephalic. Mouth no lesions, tongue papillated, no cheilosis. Neck supple without adenopathy, thyroid not enlarged, no carotid bruits, no JVD. Lungs Clear to auscultation bilaterally. Cor normal S1, normal S2, regular rhythm, no murmur,  quiet precordium. Abdomen: Soft minimal tenderness in epigastrium. Normal active bowel sounds. No distention. Left lower quadrant unremarkable. Well-healed surgical scars. Rectal: Not done. Extremities no pedal edema. Skin no lesions. Neurological alert and oriented x 3. Psychological normal mood and affect.  Assessment and Plan:  Problem #1 Patient is post first episode of diverticulitis complicated by intramural abscess which responded   to conservative measures. She has recovered and is back to her baseline. She has known severe diverticulosis of the sigmoid colon from a prior colonoscopy in 2010. Dr. Wakefield has discussed a possible sigmoid resection with her. She would like to have her shoulder replaced first and then consider further options for diverticular disease. Dr. Wakefield recommended an endoscopy and colonoscopy before her  shoulder surgery. We will go ahead and schedule it. She will continue Metamucil 1 teaspoon daily and a 20 g daily fiber diet. Problem #2 frozen shoulder- followed by Dr wainer  04/17/2012 Sandra Craig  

## 2012-04-30 ENCOUNTER — Encounter (HOSPITAL_COMMUNITY): Payer: Self-pay | Admitting: *Deleted

## 2012-04-30 ENCOUNTER — Encounter (HOSPITAL_COMMUNITY)
Admission: RE | Disposition: A | Discharge status: Home / Self Care | Payer: Self-pay | Source: Ambulatory Visit | Attending: Internal Medicine

## 2012-04-30 ENCOUNTER — Ambulatory Visit (HOSPITAL_COMMUNITY)
Admission: RE | Admit: 2012-04-30 | Discharge: 2012-04-30 | Disposition: A | Discharge status: Home / Self Care | Payer: Medicare Other | Source: Ambulatory Visit | Attending: Internal Medicine | Admitting: Internal Medicine

## 2012-04-30 DIAGNOSIS — I10 Essential (primary) hypertension: Secondary | ICD-10-CM | POA: Diagnosis not present

## 2012-04-30 DIAGNOSIS — Z96649 Presence of unspecified artificial hip joint: Secondary | ICD-10-CM | POA: Diagnosis not present

## 2012-04-30 DIAGNOSIS — R1310 Dysphagia, unspecified: Secondary | ICD-10-CM | POA: Diagnosis not present

## 2012-04-30 DIAGNOSIS — R109 Unspecified abdominal pain: Secondary | ICD-10-CM | POA: Diagnosis not present

## 2012-04-30 DIAGNOSIS — D129 Benign neoplasm of anus and anal canal: Secondary | ICD-10-CM | POA: Diagnosis not present

## 2012-04-30 DIAGNOSIS — R1319 Other dysphagia: Secondary | ICD-10-CM

## 2012-04-30 DIAGNOSIS — E785 Hyperlipidemia, unspecified: Secondary | ICD-10-CM | POA: Diagnosis not present

## 2012-04-30 DIAGNOSIS — D126 Benign neoplasm of colon, unspecified: Secondary | ICD-10-CM

## 2012-04-30 DIAGNOSIS — K5730 Diverticulosis of large intestine without perforation or abscess without bleeding: Secondary | ICD-10-CM | POA: Insufficient documentation

## 2012-04-30 DIAGNOSIS — Z800 Family history of malignant neoplasm of digestive organs: Secondary | ICD-10-CM | POA: Insufficient documentation

## 2012-04-30 DIAGNOSIS — K5732 Diverticulitis of large intestine without perforation or abscess without bleeding: Secondary | ICD-10-CM | POA: Diagnosis not present

## 2012-04-30 DIAGNOSIS — D128 Benign neoplasm of rectum: Secondary | ICD-10-CM | POA: Diagnosis not present

## 2012-04-30 DIAGNOSIS — R933 Abnormal findings on diagnostic imaging of other parts of digestive tract: Secondary | ICD-10-CM

## 2012-04-30 DIAGNOSIS — D134 Benign neoplasm of liver: Secondary | ICD-10-CM

## 2012-04-30 HISTORY — DX: Diverticulitis of intestine, part unspecified, without perforation or abscess without bleeding: K57.92

## 2012-04-30 SURGERY — COLONOSCOPY, ESOPHAGOGASTRODUODENOSCOPY (EGD) AND ESOPHAGEAL DILATION (ED)
Anesthesia: Moderate Sedation

## 2012-04-30 MED ORDER — CEFAZOLIN SODIUM-DEXTROSE 2-3 GM-% IV SOLR
2.0000 g | INTRAVENOUS | Status: DC
  Filled 2012-04-30: qty 50

## 2012-04-30 MED ORDER — SODIUM CHLORIDE 0.9 % IV SOLN: INTRAVENOUS | Status: DC

## 2012-04-30 MED ORDER — MIDAZOLAM HCL 10 MG/2ML IJ SOLN
INTRAMUSCULAR | Status: AC
  Filled 2012-04-30: qty 4

## 2012-04-30 MED ORDER — MIDAZOLAM HCL 10 MG/2ML IJ SOLN
INTRAMUSCULAR | Status: DC | PRN
  Administered 2012-04-30 (×5): 2 mg via INTRAVENOUS

## 2012-04-30 MED ORDER — FENTANYL CITRATE 0.05 MG/ML IJ SOLN
INTRAMUSCULAR | Status: DC | PRN
  Administered 2012-04-30 (×3): 25 ug via INTRAVENOUS
  Administered 2012-04-30: 15 ug via INTRAVENOUS
  Administered 2012-04-30: 10 ug via INTRAVENOUS

## 2012-04-30 MED ORDER — BUTAMBEN-TETRACAINE-BENZOCAINE 2-2-14 % EX AERO
INHALATION_SPRAY | CUTANEOUS | Status: DC | PRN
  Administered 2012-04-30: 2 via TOPICAL

## 2012-04-30 MED ORDER — FENTANYL CITRATE 0.05 MG/ML IJ SOLN
INTRAMUSCULAR | Status: AC
  Filled 2012-04-30: qty 4

## 2012-04-30 NOTE — Interval H&P Note (Signed)
History and Physical Interval Note:  04/30/2012 10:37 AM  Sandra Craig  has presented today for surgery, with the diagnosis of screening  The various methods of treatment have been discussed with the patient and family. After consideration of risks, benefits and other options for treatment, the patient has consented to  Procedure(s) (LRB) with comments: COLONOSCOPY, ESOPHAGOGASTRODUODENOSCOPY (EGD) AND ESOPHAGEAL DILATION (N/A) as a surgical intervention .  The patient's history has been reviewed, patient examined, no change in status, stable for surgery.  I have reviewed the patient's chart and labs.  Questions were answered to the patient's satisfaction.     [name]

## 2012-04-30 NOTE — Op Note (Signed)
[facility] [address] [place], [address]   ENDOSCOPY PROCEDURE REPORT  PATIENT: Sandra Craig, Sandra Craig  MR#: [id number] BIRTHDATE: 1940/11/10 , 70  yrs. old GENDER: Female ENDOSCOPIST: Hart Carwin, MD REFERRED BY:  Salvatore Marvel, M.D.  Emelia Loron, M.D. PROCEDURE DATE:  04/30/2012 PROCEDURE:  EGD, diagnostic and Maloney dilation of esophagus ASA CLASS:     Class II INDICATIONS:  dysphagia.   hx of gastritis 2010 EGD,,intermittent dysphagia. MEDICATIONS: These medications were titrated to patient response per physician's verbal order, Versed-Detailed 6 mg IV, and Fentanyl-Detailed 8 mg IV TOPICAL ANESTHETIC: Cetacaine Spray  DESCRIPTION OF PROCEDURE: After the risks benefits and alternatives of the procedure were thoroughly explained, informed consent was obtained.  The endoscope [id number] endoscope was introduced through the mouth and advanced to the second portion of the duodenum. Without limitations.  The instrument was slowly withdrawn as the mucosa was fully examined.    The upper, middle and distal third of the esophagus were carefully inspected and no abnormalities were noted.  The z-line was well seen at the GEJ. There was no stricture. Maloney dilator 50 F passed witout excessive resistance. The endoscope was pushed into the fundus which was normal including a retroflexed view.  The antrum, gastric body, first and second part of the duodenum were unremarkable.  Retroflexed views revealed no abnormalities.     The scope was then withdrawn from the patient and the procedure completed.  COMPLICATIONS: There were no complications. ENDOSCOPIC IMPRESSION: Normal EGD Passage of 50 F Maloney dilator, no evidence of stricture  RECOMMENDATIONS: dysphagia likely functional  REPEAT EXAM: no recall indicated  eSigned:  Hart Carwin, MD 04/30/2012 11:26 AM   CC:

## 2012-04-30 NOTE — Op Note (Signed)
[facility] [address] [place], [address]   COLONOSCOPY PROCEDURE REPORT  PATIENT: Analicia, Skibinski  MR#: [id number] BIRTHDATE: Mar 15, 1941 , 70  yrs. old GENDER: Female ENDOSCOPIST: Hart Carwin, MD REFERRED BY:  Emelia Loron, M.D.  Salvatore Marvel, M.D. PROCEDURE DATE:  04/30/2012 PROCEDURE:   Colonoscopy with snare polypectomy and Colonoscopy with cold biopsy polypectomy ASA CLASS: INDICATIONS:recent hospitalization for diverticulitis with intramural abcess, last colo 2010- diverticulosis, family hx of colon cancer in [facility]. MEDICATIONS: These medications were titrated to patient response per physician's verbal order, Versed-Detailed 2 mg IV, and Fentanyl-Detailed 20 mcg IV  DESCRIPTION OF PROCEDURE:   After the risks and benefits and of the procedure were explained, informed consent was obtained.  A digital rectal exam revealed no abnormalities of the rectum.    The Colonoscope [id number]  endoscope was introduced through the anus and advanced to the cecum, which was identified by both the appendix and ileocecal valve .  The quality of the prep was excellent, using MoviPrep .  The instrument was then slowly withdrawn as the colon was fully examined.     COLON FINDINGS: A smooth sessile polyp measuring 9 mm in size was found at the cecum.  A polypectomy was performed with a cold snare and with cold forceps.  The resection was complete and the polyp tissue was partially retrieved.   There was mild diverticulosis noted in the sigmoid colon with associated angulation and petechiae.at 30 cm, the lumen did nopt appear to be narrowed, thyere were multiple diverticuli in the i area but none of them showed inflammatory changes except for the small patch on erythema and edma at 30 cm  which might  represent area of prior divericulitis. No bleeding was noted from the diverticulosis. Retroflexed views revealed no abnormalities.     The scope was then withdrawn  from the patient and the procedure completed.  COMPLICATIONS: There were no complications. ENDOSCOPIC IMPRESSION: 1.   Sessile polyp measuring 9 mm in size was found at the cecum; polypectomy was performed with a cold snare and with cold forceps ,only part of the polyp recovered 2.   There was mild diverticulosis noted in the sigmoid colon , most prevalent at 30 cm, where a small mpatch of erythema and mucosal petechiae were observed but over all the sigmoid colon was travresed without undue pressure  RECOMMENDATIONS: High fiber diet I think it may be acceptable to wait and see how she does without immediate sigmoid resection surgery. She has a scheduled shoulder surgery  and it would be OK from GI standpoint to go ahead with that. Metamucil 1 tsp daily. Reassess  her GI symptmos after the shoulder surgery   REPEAT EXAM:  cc:  _______________________________ eSignedHart Carwin, MD 04/30/2012 11:45 AM     PATIENT NAME:  Amaira, Safley MR#: [id number]

## 2012-05-01 ENCOUNTER — Encounter (HOSPITAL_COMMUNITY): Payer: Self-pay

## 2012-05-01 ENCOUNTER — Other Ambulatory Visit (HOSPITAL_COMMUNITY): Payer: Medicare Other

## 2012-05-02 ENCOUNTER — Encounter: Payer: Self-pay | Admitting: Internal Medicine

## 2012-05-07 ENCOUNTER — Telehealth (INDEPENDENT_AMBULATORY_CARE_PROVIDER_SITE_OTHER): Payer: Self-pay | Admitting: General Surgery

## 2012-05-07 ENCOUNTER — Telehealth: Payer: Self-pay | Admitting: Internal Medicine

## 2012-05-07 ENCOUNTER — Other Ambulatory Visit: Payer: Self-pay | Admitting: *Deleted

## 2012-05-07 MED ORDER — GABAPENTIN 100 MG PO CAPS: 100.0000 mg | ORAL_CAPSULE | Freq: Every day | ORAL | Status: DC

## 2012-05-07 NOTE — Telephone Encounter (Signed)
Message copied by Littie Deeds on Mon May 07, 2012  1:51 PM ------      Message from: Marnette Burgess      Created: Mon May 07, 2012  1:08 PM      Contact: [phone]       Patient called is needing to come in to see Dr. Mauri Reading sooner, she is currently scheduled to see him on 05/21/12, please call.

## 2012-05-07 NOTE — Telephone Encounter (Signed)
Patient wants to be sure she is cleared for her shoulder surgery. Read patient the procedure report that Dr. Juanda Chance states she is ok for shoulder surgery per GI standpoint.

## 2012-05-07 NOTE — Telephone Encounter (Signed)
Spoke with pt and moved her appt up to 10/15 at 4:40.  She was fine with this.

## 2012-05-07 NOTE — Telephone Encounter (Signed)
Left a message for patient to call me. 

## 2012-05-08 ENCOUNTER — Other Ambulatory Visit: Payer: Self-pay | Admitting: *Deleted

## 2012-05-10 DIAGNOSIS — M25519 Pain in unspecified shoulder: Secondary | ICD-10-CM | POA: Diagnosis not present

## 2012-05-13 DIAGNOSIS — Z23 Encounter for immunization: Secondary | ICD-10-CM | POA: Diagnosis not present

## 2012-05-15 ENCOUNTER — Ambulatory Visit (INDEPENDENT_AMBULATORY_CARE_PROVIDER_SITE_OTHER): Payer: Medicare Other | Admitting: General Surgery

## 2012-05-15 ENCOUNTER — Encounter (INDEPENDENT_AMBULATORY_CARE_PROVIDER_SITE_OTHER): Payer: Self-pay | Admitting: General Surgery

## 2012-05-15 VITALS — BP 128/80 | HR 76 | Temp 97.8°F | Resp 16 | Ht 66.0 in | Wt 135.2 lb

## 2012-05-15 DIAGNOSIS — K5732 Diverticulitis of large intestine without perforation or abscess without bleeding: Secondary | ICD-10-CM | POA: Diagnosis not present

## 2012-05-15 DIAGNOSIS — Z1231 Encounter for screening mammogram for malignant neoplasm of breast: Secondary | ICD-10-CM | POA: Diagnosis not present

## 2012-05-16 ENCOUNTER — Encounter: Payer: Self-pay | Admitting: Obstetrics and Gynecology

## 2012-05-16 NOTE — Progress Notes (Signed)
Subjective:     Patient ID: Sandra Craig, female   DOB: 08/22/1940, 71 y.o.   MRN: [id number]  HPI 24 yof admitted to hospital for diverticulitis with intramural abscess that resolved conservatively with antibiotics only.  I did switch her from c/f to invanz during hospitalization.  Since then she is well and she does not describe any more abdominal pain.  She has no real complaints.  She has undergone a colonoscopy with Dr. Juanda Chance that shows diverticuli and a couple small polyps but no evidence of any stricture.  She has no more brb that appeared to be from hemorrhoids before either.  Review of Systems     Objective:   Physical Exam    low midline well healed, abdomen soft, nontender, nondistended Assessment:     History diverticulitis    Plan:     We discussed all her options from observation to surgery.  I think there is certainly possibility of another episode and that episode could require emergent surgery.  But given her colonoscopy and her current state I think reasonable to follow her.  She is ok with that also.  She knows to call asap if she develops more symptoms.  I think it is fine to get her shoulder operation now with Dr. Dion Saucier also. I will see back as needed

## 2012-05-21 ENCOUNTER — Encounter (INDEPENDENT_AMBULATORY_CARE_PROVIDER_SITE_OTHER): Payer: Medicare Other | Admitting: General Surgery

## 2012-05-23 ENCOUNTER — Encounter (HOSPITAL_COMMUNITY): Payer: Self-pay | Admitting: Pharmacy Technician

## 2012-05-30 ENCOUNTER — Other Ambulatory Visit: Payer: Self-pay | Admitting: Orthopedic Surgery

## 2012-05-30 ENCOUNTER — Encounter (HOSPITAL_BASED_OUTPATIENT_CLINIC_OR_DEPARTMENT_OTHER): Payer: Self-pay

## 2012-05-30 ENCOUNTER — Encounter (HOSPITAL_COMMUNITY): Payer: Self-pay

## 2012-05-30 ENCOUNTER — Ambulatory Visit (HOSPITAL_BASED_OUTPATIENT_CLINIC_OR_DEPARTMENT_OTHER): Admit: 2012-05-30 | Payer: Medicare Other | Admitting: Orthopedic Surgery

## 2012-05-30 ENCOUNTER — Encounter (HOSPITAL_COMMUNITY)
Admission: RE | Admit: 2012-05-30 | Discharge: 2012-05-30 | Disposition: A | Discharge status: Home / Self Care | Payer: Medicare Other | Source: Ambulatory Visit | Attending: Orthopedic Surgery | Admitting: Orthopedic Surgery

## 2012-05-30 LAB — URINALYSIS, ROUTINE W REFLEX MICROSCOPIC
Bilirubin Urine: NEGATIVE
Glucose, UA: NEGATIVE mg/dL
Hgb urine dipstick: NEGATIVE
Ketones, ur: NEGATIVE mg/dL
Leukocytes, UA: NEGATIVE
Nitrite: NEGATIVE
Protein, ur: NEGATIVE mg/dL
Specific Gravity, Urine: 1.016 (ref 1.005–1.030)
Urobilinogen, UA: 0.2 mg/dL (ref 0.0–1.0)
pH: 5.5 (ref 5.0–8.0)

## 2012-05-30 LAB — BASIC METABOLIC PANEL
BUN: 16 mg/dL (ref 6–23)
CO2: 30 mEq/L (ref 19–32)
Calcium: 10 mg/dL (ref 8.4–10.5)
Chloride: 108 mEq/L (ref 96–112)
Creatinine, Ser: 1.23 mg/dL — ABNORMAL HIGH (ref 0.50–1.10)
GFR calc Af Amer: 50 mL/min — ABNORMAL LOW (ref >90)
GFR calc non Af Amer: 43 mL/min — ABNORMAL LOW (ref >90)
Glucose, Bld: 85 mg/dL (ref 70–99)
Potassium: 4.3 mEq/L (ref 3.5–5.1)
Sodium: 145 mEq/L (ref 135–145)

## 2012-05-30 LAB — TYPE AND SCREEN
ABO/RH(D): O NEG
Antibody Screen: NEGATIVE

## 2012-05-30 LAB — CBC
HCT: 40.3 % (ref 36.0–46.0)
Hemoglobin: 12.8 g/dL (ref 12.0–15.0)
MCH: 28.4 pg (ref 26.0–34.0)
MCHC: 31.8 g/dL (ref 30.0–36.0)
MCV: 89.4 fL (ref 78.0–100.0)
Platelets: 174 10*3/uL (ref 150–400)
RBC: 4.51 MIL/uL (ref 3.87–5.11)
RDW: 15.5 % (ref 11.5–15.5)
WBC: 6.5 10*3/uL (ref 4.0–10.5)

## 2012-05-30 LAB — PROTIME-INR
INR: 1.07 (ref 0.00–1.49)
Prothrombin Time: 13.8 seconds (ref 11.6–15.2)

## 2012-05-30 LAB — SURGICAL PCR SCREEN
MRSA, PCR: NEGATIVE
Staphylococcus aureus: NEGATIVE

## 2012-05-30 SURGERY — ARTHROPLASTY, SHOULDER, TOTAL
Anesthesia: General | Laterality: Right

## 2012-05-30 NOTE — Progress Notes (Addendum)
Pt is concerned about falling .  She has had hip replacement in the past and is afraid now she will not have good balance post operatively .  Also spoke with Darcie at Dr Shelba Flake office... Regarding no orders at PAT visit and  Pt's concern about falling ,

## 2012-05-30 NOTE — Pre-Procedure Instructions (Signed)
21 Sandra Craig  05/30/2012   Your procedure is scheduled on: November 05 Tuesday 2013  Report to [facility] at 0900 AM.  Call this number if you have problems the morning of surgery: [phone]   Remember:   Do not eat food or drink liquids:After Midnight.   Take these medicines the morning of surgery with A SIP OF WATER:   zoloft   Do not wear jewelry, make-up or nail polish.  Do not wear lotions, powders, or perfumes. You may wear deodorant.  Do not shave 48 hours prior to surgery. Men may shave face and neck.  Do not bring valuables to the hospital.  Contacts, dentures or bridgework may not be worn into surgery.  Leave suitcase in the car. After surgery it may be brought to your room.  For patients admitted to the hospital, checkout time is 11:00 AM the day of discharge.   Patients discharged the day of surgery will not be allowed to drive home.  Name and phone number of your driver:   Special Instructions: Shower using CHG 2 nights before surgery and the night before surgery.  If you shower the day of surgery use CHG.  Use special wash - you have one bottle of CHG for all showers.  You should use approximately 1/3 of the bottle for each shower.   Please read over the following fact sheets that you were given: Pain Booklet, Coughing and Deep Breathing, Blood Transfusion Information, Lab Information, Total Joint Packet, MRSA Information and Surgical Site Infection Prevention

## 2012-06-04 ENCOUNTER — Encounter: Payer: Self-pay | Admitting: Internal Medicine

## 2012-06-04 ENCOUNTER — Ambulatory Visit
Admission: RE | Admit: 2012-06-04 | Discharge: 2012-06-04 | Disposition: A | Discharge status: Home / Self Care | Payer: Medicare Other | Source: Ambulatory Visit | Attending: Critical Care Medicine | Admitting: Critical Care Medicine

## 2012-06-04 DIAGNOSIS — R911 Solitary pulmonary nodule: Secondary | ICD-10-CM | POA: Diagnosis not present

## 2012-06-04 MED ORDER — CEFAZOLIN SODIUM-DEXTROSE 2-3 GM-% IV SOLR
2.0000 g | INTRAVENOUS | Status: DC
  Filled 2012-06-04: qty 50

## 2012-06-05 ENCOUNTER — Encounter (HOSPITAL_COMMUNITY): Payer: Self-pay | Admitting: Anesthesiology

## 2012-06-05 ENCOUNTER — Inpatient Hospital Stay (HOSPITAL_COMMUNITY)
Admission: RE | Admit: 2012-06-05 | Discharge: 2012-06-08 | DRG: 483 | Disposition: A | Discharge status: Home Health | Payer: Medicare Other | Source: Ambulatory Visit | Attending: Orthopedic Surgery | Admitting: Orthopedic Surgery

## 2012-06-05 ENCOUNTER — Ambulatory Visit (HOSPITAL_COMMUNITY): Payer: Medicare Other | Admitting: Anesthesiology

## 2012-06-05 ENCOUNTER — Encounter (HOSPITAL_COMMUNITY): Payer: Self-pay | Admitting: Surgery

## 2012-06-05 ENCOUNTER — Inpatient Hospital Stay (HOSPITAL_COMMUNITY): Payer: Medicare Other

## 2012-06-05 ENCOUNTER — Encounter (HOSPITAL_COMMUNITY): Payer: Self-pay | Admitting: Orthopedic Surgery

## 2012-06-05 ENCOUNTER — Encounter (HOSPITAL_COMMUNITY)
Admission: RE | Disposition: A | Discharge status: Home Health | Payer: Self-pay | Source: Ambulatory Visit | Attending: Orthopedic Surgery

## 2012-06-05 ENCOUNTER — Inpatient Hospital Stay: Admit: 2012-06-05 | Payer: Self-pay | Admitting: Orthopedic Surgery

## 2012-06-05 DIAGNOSIS — Z800 Family history of malignant neoplasm of digestive organs: Secondary | ICD-10-CM

## 2012-06-05 DIAGNOSIS — Z96649 Presence of unspecified artificial hip joint: Secondary | ICD-10-CM | POA: Diagnosis not present

## 2012-06-05 DIAGNOSIS — Z23 Encounter for immunization: Secondary | ICD-10-CM | POA: Diagnosis not present

## 2012-06-05 DIAGNOSIS — Z01812 Encounter for preprocedural laboratory examination: Secondary | ICD-10-CM | POA: Diagnosis not present

## 2012-06-05 DIAGNOSIS — Z79899 Other long term (current) drug therapy: Secondary | ICD-10-CM | POA: Diagnosis not present

## 2012-06-05 DIAGNOSIS — M8700 Idiopathic aseptic necrosis of unspecified bone: Secondary | ICD-10-CM | POA: Diagnosis present

## 2012-06-05 DIAGNOSIS — G4700 Insomnia, unspecified: Secondary | ICD-10-CM | POA: Diagnosis present

## 2012-06-05 DIAGNOSIS — F329 Major depressive disorder, single episode, unspecified: Secondary | ICD-10-CM | POA: Diagnosis present

## 2012-06-05 DIAGNOSIS — M949 Disorder of cartilage, unspecified: Secondary | ICD-10-CM | POA: Diagnosis present

## 2012-06-05 DIAGNOSIS — Q613 Polycystic kidney, unspecified: Secondary | ICD-10-CM | POA: Diagnosis not present

## 2012-06-05 DIAGNOSIS — Z87891 Personal history of nicotine dependence: Secondary | ICD-10-CM

## 2012-06-05 DIAGNOSIS — M8708 Idiopathic aseptic necrosis of bone, other site: Secondary | ICD-10-CM | POA: Diagnosis not present

## 2012-06-05 DIAGNOSIS — E785 Hyperlipidemia, unspecified: Secondary | ICD-10-CM | POA: Diagnosis present

## 2012-06-05 DIAGNOSIS — G8918 Other acute postprocedural pain: Secondary | ICD-10-CM | POA: Diagnosis not present

## 2012-06-05 DIAGNOSIS — I10 Essential (primary) hypertension: Secondary | ICD-10-CM | POA: Diagnosis not present

## 2012-06-05 DIAGNOSIS — M899 Disorder of bone, unspecified: Secondary | ICD-10-CM | POA: Diagnosis present

## 2012-06-05 DIAGNOSIS — M25519 Pain in unspecified shoulder: Secondary | ICD-10-CM | POA: Diagnosis not present

## 2012-06-05 DIAGNOSIS — IMO0001 Reserved for inherently not codable concepts without codable children: Secondary | ICD-10-CM | POA: Diagnosis present

## 2012-06-05 DIAGNOSIS — F3289 Other specified depressive episodes: Secondary | ICD-10-CM | POA: Diagnosis present

## 2012-06-05 DIAGNOSIS — M87029 Idiopathic aseptic necrosis of unspecified humerus: Secondary | ICD-10-CM | POA: Diagnosis not present

## 2012-06-05 DIAGNOSIS — M19019 Primary osteoarthritis, unspecified shoulder: Secondary | ICD-10-CM | POA: Diagnosis not present

## 2012-06-05 HISTORY — DX: Idiopathic aseptic necrosis of unspecified bone: M87.00

## 2012-06-05 HISTORY — PX: TOTAL SHOULDER ARTHROPLASTY: SHX126

## 2012-06-05 HISTORY — PX: SHOULDER HEMI-ARTHROPLASTY: SHX5049

## 2012-06-05 SURGERY — ARTHROPLASTY, SHOULDER, TOTAL
Anesthesia: General | Laterality: Right

## 2012-06-05 SURGERY — ARTHROPLASTY, SHOULDER, TOTAL
Anesthesia: General | Site: Shoulder | Laterality: Right | Wound class: Clean

## 2012-06-05 MED ORDER — MENTHOL 3 MG MT LOZG: 1.0000 | LOZENGE | OROMUCOSAL | Status: DC | PRN

## 2012-06-05 MED ORDER — POLYETHYLENE GLYCOL 3350 17 G PO PACK: 17.0000 g | PACK | Freq: Every day | ORAL | Status: DC | PRN

## 2012-06-05 MED ORDER — DIPHENHYDRAMINE HCL 12.5 MG/5ML PO ELIX: 12.5000 mg | ORAL_SOLUTION | ORAL | Status: DC | PRN

## 2012-06-05 MED ORDER — ATORVASTATIN CALCIUM 40 MG PO TABS
40.0000 mg | ORAL_TABLET | Freq: Every day | ORAL | Status: DC
  Administered 2012-06-05 – 2012-06-07 (×3): 40 mg via ORAL
  Filled 2012-06-05 (×4): qty 1

## 2012-06-05 MED ORDER — ACETAMINOPHEN 325 MG PO TABS: 650.0000 mg | ORAL_TABLET | Freq: Four times a day (QID) | ORAL | Status: DC | PRN

## 2012-06-05 MED ORDER — ONDANSETRON HCL 4 MG/2ML IJ SOLN
4.0000 mg | Freq: Four times a day (QID) | INTRAMUSCULAR | Status: DC | PRN
  Administered 2012-06-05: 4 mg via INTRAVENOUS

## 2012-06-05 MED ORDER — MIDAZOLAM HCL 2 MG/2ML IJ SOLN
INTRAMUSCULAR | Status: AC
  Filled 2012-06-05: qty 2

## 2012-06-05 MED ORDER — FENTANYL CITRATE 0.05 MG/ML IJ SOLN
INTRAMUSCULAR | Status: DC | PRN
  Administered 2012-06-05: 100 ug via INTRAVENOUS

## 2012-06-05 MED ORDER — PHENYLEPHRINE HCL 10 MG/ML IJ SOLN
INTRAMUSCULAR | Status: DC | PRN
  Administered 2012-06-05 (×2): 120 ug via INTRAVENOUS

## 2012-06-05 MED ORDER — LACTATED RINGERS IV SOLN
INTRAVENOUS | Status: DC
  Administered 2012-06-05: 11:00:00 via INTRAVENOUS

## 2012-06-05 MED ORDER — HYDROMORPHONE HCL PF 1 MG/ML IJ SOLN
INTRAMUSCULAR | Status: AC
  Administered 2012-06-05: 0.5 mg via INTRAVENOUS
  Filled 2012-06-05: qty 1

## 2012-06-05 MED ORDER — METOCLOPRAMIDE HCL 10 MG PO TABS: 5.0000 mg | ORAL_TABLET | Freq: Three times a day (TID) | ORAL | Status: DC | PRN

## 2012-06-05 MED ORDER — EPHEDRINE SULFATE 50 MG/ML IJ SOLN
INTRAMUSCULAR | Status: DC | PRN
  Administered 2012-06-05: 15 mg via INTRAVENOUS

## 2012-06-05 MED ORDER — ENOXAPARIN SODIUM 40 MG/0.4ML ~~LOC~~ SOLN
40.0000 mg | SUBCUTANEOUS | Status: DC
  Administered 2012-06-06 – 2012-06-07 (×2): 40 mg via SUBCUTANEOUS
  Filled 2012-06-05 (×4): qty 0.4

## 2012-06-05 MED ORDER — PHENOL 1.4 % MT LIQD: 1.0000 | OROMUCOSAL | Status: DC | PRN

## 2012-06-05 MED ORDER — MULTIVITAMINS PO CAPS
1.0000 | ORAL_CAPSULE | Freq: Every day | ORAL | Status: DC
  Filled 2012-06-05 (×2): qty 1

## 2012-06-05 MED ORDER — ALUM & MAG HYDROXIDE-SIMETH 200-200-20 MG/5ML PO SUSP: 30.0000 mL | ORAL | Status: DC | PRN

## 2012-06-05 MED ORDER — GLYCOPYRROLATE 0.2 MG/ML IJ SOLN
INTRAMUSCULAR | Status: DC | PRN
  Administered 2012-06-05: 0.3 mg via INTRAVENOUS

## 2012-06-05 MED ORDER — PROMETHAZINE HCL 25 MG/ML IJ SOLN
6.2500 mg | INTRAMUSCULAR | Status: DC | PRN
  Administered 2012-06-05: 6.25 mg via INTRAVENOUS

## 2012-06-05 MED ORDER — ONDANSETRON HCL 4 MG PO TABS: 4.0000 mg | ORAL_TABLET | Freq: Four times a day (QID) | ORAL | Status: DC | PRN

## 2012-06-05 MED ORDER — METHOCARBAMOL 500 MG PO TABS
500.0000 mg | ORAL_TABLET | Freq: Four times a day (QID) | ORAL | Status: DC | PRN
  Administered 2012-06-06 – 2012-06-07 (×6): 500 mg via ORAL
  Filled 2012-06-05 (×6): qty 1

## 2012-06-05 MED ORDER — ONDANSETRON HCL 4 MG/2ML IJ SOLN
INTRAMUSCULAR | Status: DC | PRN
  Administered 2012-06-05: 4 mg via INTRAVENOUS

## 2012-06-05 MED ORDER — LACTATED RINGERS IV SOLN
INTRAVENOUS | Status: DC | PRN
  Administered 2012-06-05 (×2): via INTRAVENOUS

## 2012-06-05 MED ORDER — FENTANYL CITRATE 0.05 MG/ML IJ SOLN
INTRAMUSCULAR | Status: AC
  Filled 2012-06-05: qty 2

## 2012-06-05 MED ORDER — ZOLPIDEM TARTRATE 10 MG PO TABS: 10.0000 mg | ORAL_TABLET | Freq: Every evening | ORAL | Status: DC | PRN

## 2012-06-05 MED ORDER — FENTANYL CITRATE 0.05 MG/ML IJ SOLN
50.0000 ug | INTRAMUSCULAR | Status: DC | PRN
  Administered 2012-06-05: 100 ug via INTRAVENOUS

## 2012-06-05 MED ORDER — LOSARTAN POTASSIUM 50 MG PO TABS
50.0000 mg | ORAL_TABLET | Freq: Every day | ORAL | Status: DC
  Administered 2012-06-05 – 2012-06-08 (×3): 50 mg via ORAL
  Filled 2012-06-05 (×4): qty 1

## 2012-06-05 MED ORDER — HYDROMORPHONE HCL PF 1 MG/ML IJ SOLN
0.2500 mg | INTRAMUSCULAR | Status: DC | PRN
  Administered 2012-06-05 (×2): 0.5 mg via INTRAVENOUS

## 2012-06-05 MED ORDER — MIDAZOLAM HCL 2 MG/2ML IJ SOLN
1.0000 mg | INTRAMUSCULAR | Status: DC | PRN
  Administered 2012-06-05: 2 mg via INTRAVENOUS

## 2012-06-05 MED ORDER — HYDROMORPHONE HCL PF 1 MG/ML IJ SOLN
0.5000 mg | INTRAMUSCULAR | Status: DC | PRN
  Administered 2012-06-06 – 2012-06-07 (×3): 1 mg via INTRAVENOUS
  Filled 2012-06-05 (×3): qty 1

## 2012-06-05 MED ORDER — ONDANSETRON HCL 4 MG/2ML IJ SOLN
INTRAMUSCULAR | Status: AC
  Administered 2012-06-05: 4 mg via INTRAVENOUS
  Filled 2012-06-05: qty 2

## 2012-06-05 MED ORDER — POTASSIUM CHLORIDE IN NACL 20-0.45 MEQ/L-% IV SOLN
INTRAVENOUS | Status: DC
  Administered 2012-06-05 – 2012-06-07 (×3): via INTRAVENOUS
  Filled 2012-06-05 (×6): qty 1000

## 2012-06-05 MED ORDER — SORBITOL 70 % SOLN
30.0000 mL | Freq: Every day | Status: DC | PRN
  Administered 2012-06-07: 30 mL via ORAL
  Filled 2012-06-05: qty 30

## 2012-06-05 MED ORDER — SENNA 8.6 MG PO TABS
1.0000 | ORAL_TABLET | Freq: Two times a day (BID) | ORAL | Status: DC
  Administered 2012-06-05 – 2012-06-08 (×5): 8.6 mg via ORAL
  Filled 2012-06-05 (×7): qty 1

## 2012-06-05 MED ORDER — GABAPENTIN 100 MG PO CAPS
100.0000 mg | ORAL_CAPSULE | Freq: Every day | ORAL | Status: DC
  Administered 2012-06-05 – 2012-06-08 (×4): 100 mg via ORAL
  Filled 2012-06-05 (×4): qty 1

## 2012-06-05 MED ORDER — CEFAZOLIN SODIUM-DEXTROSE 2-3 GM-% IV SOLR
2.0000 g | Freq: Four times a day (QID) | INTRAVENOUS | Status: AC
  Administered 2012-06-05 – 2012-06-06 (×3): 2 g via INTRAVENOUS
  Filled 2012-06-05 (×3): qty 50

## 2012-06-05 MED ORDER — 0.9 % SODIUM CHLORIDE (POUR BTL) OPTIME
TOPICAL | Status: DC | PRN
  Administered 2012-06-05: 1000 mL

## 2012-06-05 MED ORDER — PROPOFOL 10 MG/ML IV BOLUS
INTRAVENOUS | Status: DC | PRN
  Administered 2012-06-05: 160 mg via INTRAVENOUS

## 2012-06-05 MED ORDER — ZOLPIDEM TARTRATE 5 MG PO TABS: 5.0000 mg | ORAL_TABLET | Freq: Every evening | ORAL | Status: DC | PRN

## 2012-06-05 MED ORDER — ACETAMINOPHEN 650 MG RE SUPP: 650.0000 mg | Freq: Four times a day (QID) | RECTAL | Status: DC | PRN

## 2012-06-05 MED ORDER — METHOCARBAMOL 100 MG/ML IJ SOLN
500.0000 mg | Freq: Four times a day (QID) | INTRAMUSCULAR | Status: DC | PRN
  Filled 2012-06-05: qty 5

## 2012-06-05 MED ORDER — PHENYLEPHRINE HCL 10 MG/ML IJ SOLN
10.0000 mg | INTRAVENOUS | Status: DC | PRN
  Administered 2012-06-05: 25 ug/min via INTRAVENOUS

## 2012-06-05 MED ORDER — DOCUSATE SODIUM 100 MG PO CAPS
100.0000 mg | ORAL_CAPSULE | Freq: Two times a day (BID) | ORAL | Status: DC
  Administered 2012-06-05 – 2012-06-08 (×5): 100 mg via ORAL
  Filled 2012-06-05 (×7): qty 1

## 2012-06-05 MED ORDER — PROMETHAZINE HCL 25 MG/ML IJ SOLN
INTRAMUSCULAR | Status: AC
  Administered 2012-06-05: 6.25 mg via INTRAVENOUS
  Filled 2012-06-05: qty 1

## 2012-06-05 MED ORDER — OXYCODONE HCL 5 MG PO TABS
5.0000 mg | ORAL_TABLET | ORAL | Status: DC | PRN
  Administered 2012-06-06: 10 mg via ORAL
  Administered 2012-06-07 (×3): 5 mg via ORAL
  Administered 2012-06-07: 10 mg via ORAL
  Administered 2012-06-08: 5 mg via ORAL
  Filled 2012-06-05 (×2): qty 1
  Filled 2012-06-05: qty 2
  Filled 2012-06-05 (×2): qty 1
  Filled 2012-06-05: qty 2

## 2012-06-05 MED ORDER — OXYCODONE-ACETAMINOPHEN 5-325 MG PO TABS
1.0000 | ORAL_TABLET | ORAL | Status: DC | PRN
  Administered 2012-06-06: 2 via ORAL
  Administered 2012-06-06: 1 via ORAL
  Administered 2012-06-06 – 2012-06-07 (×4): 2 via ORAL
  Administered 2012-06-07 (×2): 1 via ORAL
  Administered 2012-06-07 – 2012-06-08 (×3): 2 via ORAL
  Filled 2012-06-05 (×6): qty 2
  Filled 2012-06-05: qty 1
  Filled 2012-06-05 (×2): qty 2
  Filled 2012-06-05 (×2): qty 1

## 2012-06-05 MED ORDER — NEOSTIGMINE METHYLSULFATE 1 MG/ML IJ SOLN
INTRAMUSCULAR | Status: DC | PRN
  Administered 2012-06-05: 2 mg via INTRAVENOUS

## 2012-06-05 MED ORDER — SERTRALINE HCL 100 MG PO TABS
100.0000 mg | ORAL_TABLET | Freq: Every day | ORAL | Status: DC
  Administered 2012-06-05 – 2012-06-08 (×4): 100 mg via ORAL
  Filled 2012-06-05 (×4): qty 1

## 2012-06-05 MED ORDER — ARTIFICIAL TEARS OP OINT
TOPICAL_OINTMENT | OPHTHALMIC | Status: DC | PRN
  Administered 2012-06-05: 1 via OPHTHALMIC

## 2012-06-05 MED ORDER — ROCURONIUM BROMIDE 100 MG/10ML IV SOLN
INTRAVENOUS | Status: DC | PRN
  Administered 2012-06-05: 40 mg via INTRAVENOUS

## 2012-06-05 MED ORDER — METOCLOPRAMIDE HCL 5 MG/ML IJ SOLN: 5.0000 mg | Freq: Three times a day (TID) | INTRAMUSCULAR | Status: DC | PRN

## 2012-06-05 SURGICAL SUPPLY — 71 items
BENZOIN TINCTURE PRP APPL 2/3 (GAUZE/BANDAGES/DRESSINGS) IMPLANT
BLADE SAW SAG 29X58X.64 (BLADE) IMPLANT
BOOTCOVER CLEANROOM LRG (PROTECTIVE WEAR) IMPLANT
BOWL SMART MIX CTS (DISPOSABLE) IMPLANT
BRUSH FEMORAL CANAL (MISCELLANEOUS) IMPLANT
CATH FOLEY 2WAY SLVR  5CC 14FR (CATHETERS) ×1
CATH FOLEY 2WAY SLVR 5CC 14FR (CATHETERS) ×1 IMPLANT
CLOTH BEACON ORANGE TIMEOUT ST (SAFETY) ×2 IMPLANT
CLSR STERI-STRIP ANTIMIC 1/2X4 (GAUZE/BANDAGES/DRESSINGS) ×2 IMPLANT
COVER SURGICAL LIGHT HANDLE (MISCELLANEOUS) ×2 IMPLANT
COVER TABLE BACK 60X90 (DRAPES) ×2 IMPLANT
DRAPE C-ARM 42X72 X-RAY (DRAPES) IMPLANT
DRAPE INCISE IOBAN 66X45 STRL (DRAPES) ×2 IMPLANT
DRAPE U-SHAPE 47X51 STRL (DRAPES) ×2 IMPLANT
DRSG MEPILEX BORDER 4X8 (GAUZE/BANDAGES/DRESSINGS) ×4 IMPLANT
DRSG PAD ABDOMINAL 8X10 ST (GAUZE/BANDAGES/DRESSINGS) ×2 IMPLANT
DURAPREP 26ML APPLICATOR (WOUND CARE) ×2 IMPLANT
ELECT BLADE 6.5 EXT (BLADE) ×2 IMPLANT
ELECT NEEDLE TIP 2.8 STRL (NEEDLE) ×2 IMPLANT
ELECT REM PT RETURN 9FT ADLT (ELECTROSURGICAL) ×2
ELECTRODE REM PT RTRN 9FT ADLT (ELECTROSURGICAL) ×1 IMPLANT
EVACUATOR 1/8 PVC DRAIN (DRAIN) IMPLANT
FACESHIELD LNG OPTICON STERILE (SAFETY) ×2 IMPLANT
GLOVE BIOGEL PI IND STRL 8 (GLOVE) ×2 IMPLANT
GLOVE BIOGEL PI INDICATOR 8 (GLOVE) ×2
GLOVE ORTHO TXT STRL SZ7.5 (GLOVE) ×2 IMPLANT
GLOVE SURG ORTHO 8.0 STRL STRW (GLOVE) ×4 IMPLANT
GOWN PREVENTION PLUS XXLARGE (GOWN DISPOSABLE) ×2 IMPLANT
GOWN STRL NON-REIN LRG LVL3 (GOWN DISPOSABLE) IMPLANT
GOWN STRL REIN XL XLG (GOWN DISPOSABLE) IMPLANT
HANDPIECE INTERPULSE COAX TIP (DISPOSABLE)
HOOD PEEL AWAY FACE SHEILD DIS (HOOD) ×4 IMPLANT
KIT BASIN OR (CUSTOM PROCEDURE TRAY) ×2 IMPLANT
KIT ROOM TURNOVER OR (KITS) ×2 IMPLANT
MANIFOLD NEPTUNE II (INSTRUMENTS) ×2 IMPLANT
NEEDLE 1/2 CIR CATGUT .05X1.09 (NEEDLE) ×2 IMPLANT
NEEDLE HYPO 25GX1X1/2 BEV (NEEDLE) ×2 IMPLANT
NS IRRIG 1000ML POUR BTL (IV SOLUTION) ×2 IMPLANT
PACK SHOULDER (CUSTOM PROCEDURE TRAY) ×2 IMPLANT
PAD ARMBOARD 7.5X6 YLW CONV (MISCELLANEOUS) ×4 IMPLANT
PIN STEINMANN THREADED TIP (PIN) ×2 IMPLANT
RETRIEVER SUT HEWSON (MISCELLANEOUS) IMPLANT
SET HNDPC FAN SPRY TIP SCT (DISPOSABLE) IMPLANT
SLING ARM IMMOBILIZER LRG (SOFTGOODS) ×2 IMPLANT
SLING ARM IMMOBILIZER MED (SOFTGOODS) IMPLANT
SMARTMIX MINI TOWER (MISCELLANEOUS)
SPONGE GAUZE 4X4 12PLY (GAUZE/BANDAGES/DRESSINGS) ×2 IMPLANT
SPONGE LAP 18X18 X RAY DECT (DISPOSABLE) ×2 IMPLANT
STRIP CLOSURE SKIN 1/2X4 (GAUZE/BANDAGES/DRESSINGS) ×2 IMPLANT
SUCTION FRAZIER TIP 10 FR DISP (SUCTIONS) ×2 IMPLANT
SUPPORT WRAP ARM LG (MISCELLANEOUS) ×2 IMPLANT
SUT ETHIBOND 2 0 SH (SUTURE)
SUT ETHIBOND 2 0 SH 36X2 (SUTURE) IMPLANT
SUT ETHIBOND 2 OS 4 DA (SUTURE) IMPLANT
SUT FIBERWIRE #2 38 REV NDL BL (SUTURE) ×6
SUT FIBERWIRE #2 38 T-5 BLUE (SUTURE) ×12
SUT MNCRL AB 4-0 PS2 18 (SUTURE) ×2 IMPLANT
SUT VIC AB 0 CT1 27 (SUTURE) ×1
SUT VIC AB 0 CT1 27XBRD ANBCTR (SUTURE) ×1 IMPLANT
SUT VIC AB 2-0 CT1 27 (SUTURE) ×1
SUT VIC AB 2-0 CT1 TAPERPNT 27 (SUTURE) ×1 IMPLANT
SUT VIC AB 3-0 SH 18 (SUTURE) ×2 IMPLANT
SUTURE FIBERWR #2 38 T-5 BLUE (SUTURE) ×6 IMPLANT
SUTURE FIBERWR#2 38 REV NDL BL (SUTURE) ×3 IMPLANT
SYR CONTROL 10ML LL (SYRINGE) ×2 IMPLANT
TOWEL OR 17X24 6PK STRL BLUE (TOWEL DISPOSABLE) ×2 IMPLANT
TOWEL OR 17X26 10 PK STRL BLUE (TOWEL DISPOSABLE) ×2 IMPLANT
TOWER SMARTMIX MINI (MISCELLANEOUS) IMPLANT
TRAY FOLEY CATH 14FR (SET/KITS/TRAYS/PACK) ×2 IMPLANT
TUBE SUCT ARGYLE STRL (TUBING) ×2 IMPLANT
WATER STERILE IRR 1000ML POUR (IV SOLUTION) ×2 IMPLANT

## 2012-06-05 NOTE — Preoperative (Signed)
Beta Blockers   Reason not to administer Beta Blockers:Not Applicable 

## 2012-06-05 NOTE — Progress Notes (Signed)
Quick Note:  Jun 20, 2012 appt with PW cancelled. Pt aware. ______

## 2012-06-05 NOTE — Anesthesia Procedure Notes (Addendum)
Anesthesia Regional Block:  Interscalene brachial plexus block  Pre-Anesthetic Checklist: ,, timeout performed, Correct Patient, Correct Site, Correct Laterality, Correct Procedure, Correct Position, site marked, Risks and benefits discussed,  Surgical consent,  Pre-op evaluation,  At surgeon's request and post-op pain management  Laterality: Right  Prep: chloraprep       Needles:  Injection technique: Single-shot  Needle Type: Echogenic Stimulator Needle     Needle Length: 5cm 5 cm Needle Gauge: 22 and 22 G    Additional Needles:  Procedures: ultrasound guided (picture in chart) and nerve stimulator Interscalene brachial plexus block  Nerve Stimulator or Paresthesia:  Response: bicep contraction, 0.45 mA,   Additional Responses:   Narrative:  Start time: 06/05/2012 11:40 AM End time: 06/05/2012 11:50 AM Injection made incrementally with aspirations every 5 mL.  Performed by: Personally  Anesthesiologist: J. Adonis Huguenin, MD  Additional Notes: Functioning IV was confirmed and monitors applied.  A 50mm 22ga echogenic arrow stimulator was used. Sterile prep and drape,hand hygiene and sterile gloves were used.Ultrasound guidance: relevant anatomy identified, needle position confirmed, local anesthetic spread visualized around nerve(s)., vascular puncture avoided.  Image printed for medical record.  Negative aspiration and negative test dose prior to incremental administration of local anesthetic. The patient tolerated the procedure well.  Interscalene brachial plexus block Procedure Name: Intubation Date/Time: 06/05/2012 1:24 PM Performed by: Jerilee Hoh Pre-anesthesia Checklist: Patient identified, Emergency Drugs available, Suction available and Patient being monitored Patient Re-evaluated:Patient Re-evaluated prior to inductionOxygen Delivery Method: Circle system utilized Preoxygenation: Pre-oxygenation with 100% oxygen Intubation Type: IV induction Ventilation: Mask  ventilation without difficulty Grade View: Grade I Tube type: Oral Tube size: 7.5 mm Number of attempts: 1 Airway Equipment and Method: Stylet Placement Confirmation: ETT inserted through vocal cords under direct vision,  positive ETCO2 and breath sounds checked- equal and bilateral Secured at: 21 cm Tube secured with: Tape Dental Injury: Teeth and Oropharynx as per pre-operative assessment

## 2012-06-05 NOTE — Progress Notes (Signed)
Orthopedic Tech Progress Note Patient Details:  Sandra Craig Sep 24, 1940 [id number]  Patient ID: Sandra Craig, female   DOB: Aug 11, 1940, 71 y.o.   MRN: [id number] Trapeze bar patient helper  Nikki Dom 06/05/2012, 6:47 PM

## 2012-06-05 NOTE — Op Note (Signed)
06/05/2012  3:09 PM  PATIENT:  Sandra Craig    PRE-OPERATIVE DIAGNOSIS:  RIGHT AVASCULAR NECROSIS OF PROXIMAL HUMERUS  POST-OPERATIVE DIAGNOSIS:  Same  PROCEDURE:  Right SHOULDER HEMI-ARTHROPLASTY  SURGEON:  Eulas Post, MD  PHYSICIAN ASSISTANT: Janace Litten, OPA-C, present and scrubbed throughout the case, critical for completion in a timely fashion, and for retraction, instrumentation, and closure.  ANESTHESIA:   General  PREOPERATIVE INDICATIONS:  Sandra Craig is a  71 y.o. female who had chronic shoulder pain after avascular necrosis. She also appeared to be a malunion from previous fracture.  The risks benefits and alternatives were discussed with the patient preoperatively including but not limited to the risks of infection, bleeding, nerve injury, cardiopulmonary complications, the need for revision surgery, dislocation, loosening, incomplete relief of pain, among others, and the patient was willing to proceed. we also discussed the risks of incomplete relief of pain, infection particularly in light of her diverticular disease, among others.   OPERATIVE IMPLANTS: Biomet size  8 mini press-fit humeral stem, size 42 x 18 mm  Versa-dial humeral head, set in the E position with increased coverage posteriorly.  OPERATIVE FINDINGS: Advanced  avascular necrosis of the humeral head with malunion, primarily posterior with apex anterior angulation, no apparent involvement of the glenoid.  There was significant osteophyte formation around the humeral head.    OPERATIVE PROCEDURE: The patient was brought to the operating room and placed in the supine position. General anesthesia was administered. IV antibiotics were given.  The upper extremity was prepped and draped in usual sterile fashion. The patient was in a beachchair position with all bony prominences padded.   Time out was performed and a deltopectoral approach was carried out. The biceps tendon was tenodesed to the  pectoralis tendon. The subscapularis was released, tagging it with a #2 FiberWire, leaving a cuff of tendon for repair.   The inferior osteophyte was removed, and release of the capsule off of the humeral side was completed. The head was dislocated, and I reamed sequentially. I placed the humeral cutting guide at 30 of retroversion, and then pinned this into place, and made my humeral neck cut. This is at the appropriate level.   I then placed deep retractors and exposed the glenoid. I trimmed my biceps incision, and closely evaluated the glenoid which was in good condition.    because the glenoid was in good shape, I returned to the humerus, and I sequentially broached, up to the selected size, with the broach set at 30 of retroversion. I then placed the real stem. I trialed with multiple heads, and the above-named component was selected. Increased posterior coverage improved the coverage. The soft tissue tension was appropriate.   I then impacted the real humeral head into place, reduced the head, and irrigated copiously. Excellent stability and range of motion was achieved. I repaired the subscapularis with 4 #2 FiberWire, as well as the rotator interval, and irrigated copiously once more. The subcutaneous tissue was closed with Vicryl including the deltopectoral fascia.   The skin was closed with Steri-Strips and sterile gauze was applied. She had a preoperative nerve block. She tolerated the procedure well and there were no complications.

## 2012-06-05 NOTE — Anesthesia Postprocedure Evaluation (Signed)
Anesthesia Post Note  Patient: Sandra Craig  Procedure(s) Performed: Procedure(s) (LRB): TOTAL SHOULDER ARTHROPLASTY (Right) SHOULDER HEMI-ARTHROPLASTY (Right)  Anesthesia type: general  Patient location: PACU  Post pain: Pain level controlled  Post assessment: Patient's Cardiovascular Status Stable  Last Vitals:  Filed Vitals:   06/05/12 1615  BP: 137/62  Pulse: 60  Temp:   Resp: 9    Post vital signs: Reviewed and stable  Level of consciousness: sedated  Complications: No apparent anesthesia complications

## 2012-06-05 NOTE — Anesthesia Preprocedure Evaluation (Addendum)
Anesthesia Evaluation  Patient identified by MRN, date of birth, ID band Patient awake    Reviewed: Allergy & Precautions, H&P , NPO status , Patient's Chart, lab work & pertinent test results, reviewed documented beta blocker date and time   History of Anesthesia Complications Negative for: history of anesthetic complications  Airway Mallampati: II TM Distance: >3 FB Neck ROM: Full    Dental  (+) Teeth Intact and Dental Advisory Given   Pulmonary neg pulmonary ROS,    Pulmonary exam normal       Cardiovascular hypertension, Pt. on medications Rhythm:Regular Rate:Normal     Neuro/Psych  Headaches, PSYCHIATRIC DISORDERS Depression  Neuromuscular disease    GI/Hepatic Neg liver ROS, hiatal hernia, GERD-  ,  Endo/Other  negative endocrine ROS  Renal/GU Renal InsufficiencyRenal disease     Musculoskeletal  (+) Fibromyalgia -  Abdominal   Peds  Hematology   Anesthesia Other Findings   Reproductive/Obstetrics                         Anesthesia Physical Anesthesia Plan  ASA: III  Anesthesia Plan: General   Post-op Pain Management:    Induction: Intravenous  Airway Management Planned: Oral ETT  Additional Equipment:   Intra-op Plan:   Post-operative Plan: Extubation in OR  Informed Consent: I have reviewed the patients History and Physical, chart, labs and discussed the procedure including the risks, benefits and alternatives for the proposed anesthesia with the patient or authorized representative who has indicated his/her understanding and acceptance.   Dental advisory given  Plan Discussed with: CRNA, Anesthesiologist and Surgeon  Anesthesia Plan Comments:        Anesthesia Quick Evaluation

## 2012-06-05 NOTE — H&P (Signed)
PREOPERATIVE H&P  Chief Complaint: RIGHT SHOULDER DEGENERATIVE ARTHRITIS, SHOULDER-PRIMARY DIAGNOSIS 715.11, 715.11  HPI: Sandra Craig is a 71 y.o. female who presents for preoperative history and physical with a diagnosis of RIGHT SHOULDER DEGENERATIVE ARTHRITIS, SHOULDER-PRIMARY DIAGNOSIS 715.11, 715.11. Symptoms are rated as moderate to severe, and have been worsening.  This is significantly impairing activities of daily living.  She has elected for surgical management. She has failed injections, activity modifications, exercises, anti-inflammatories, among others. She has avascular necrosis of the humeral head.  Past Medical History  Diagnosis Date  . IBS (irritable bowel syndrome)   . Hypertension   . Hyperlipidemia   . IC (interstitial cystitis)   . Endometriosis   . Polycystic kidney disease   . Osteopenia   . Cystitis   . Fibromyalgia   . Palpitations   . Chronic insomnia   . Pulmonary nodule 12/08    5 mm Anterior RUL  . Hiatal hernia   . Diverticulosis of colon (without mention of hemorrhage)   . Internal hemorrhoid   . Family history of malignant neoplasm of gastrointestinal tract   . Gastritis   . Small bowel obstruction   . Osteonecrosis   . History of gallstones   . Diverticulitis of intestine without perforation or abscess without bleeding     Patient did have abscess but noperforation   Past Surgical History  Procedure Date  . Cholecystectomy   . Pelvic laparoscopy   . Abdominal hysterectomy 1994    TAH,BSO FOR ENDOMETRIOSIS  . Oophorectomy 1994    TAH,BSO  . Total hip arthroplasty FALL OF 2008    rt. partial hip replacement  . Abdominal surgery 2011    small intestine blockage  . S/p right shoulder rotater cuff 200216/2011    Tear/adhesive capsulitis  . Sbo lap 11    Adhesions and small internal hernia  . Joint replacement 2008  . Gastroplasty 2011    small bowel resection -open   History   Social History  . Marital Status: Widowed   Spouse Name: N/A    Number of Children: N/A  . Years of Education: N/A   Occupational History  . Retired     [employer]   Social History Main Topics  . Smoking status: Former Games developer  . Smokeless tobacco: Never Used     Comment: Quit in 1977  . Alcohol Use: No  . Drug Use: No  . Sexually Active: No   Other Topics Concern  . Not on file   Social History Narrative  . No narrative on file   Family History  Problem Relation Age of Onset  . Heart disease Mother     MI at age 9  . Lymphoma Maternal Grandmother   . Colon cancer Paternal Grandmother   . COPD Father   . Emphysema Mother   . Thyroid disease Mother     Thyroidectomy/Benign   Allergies  Allergen Reactions  . Celecoxib   . Hydrocodone   . Prednisone Other (See Comments)    Made pt feel "crazy"  . Sulfonamide Derivatives    Prior to Admission medications   Medication Sig Start Date End Date Taking? Authorizing Provider  Calcium Carbonate-Vitamin D (CALCIUM + D PO) Take 2 tablets by mouth daily. 1200 OF CALCIUM +D   Yes Historical Provider, MD  gabapentin (NEURONTIN) 100 MG capsule Take 1 capsule (100 mg total) by mouth daily. 05/07/12  Yes Doe-Hyun R Artist Pais, DO  losartan (COZAAR) 50 MG tablet Take 50 mg  by mouth daily.   Yes Historical Provider, MD  Multiple Vitamin (MULTIVITAMIN) capsule Take 1 capsule by mouth daily.     Yes Historical Provider, MD  rosuvastatin (CRESTOR) 20 MG tablet Take 20 mg by mouth daily.   Yes Historical Provider, MD  sertraline (ZOLOFT) 100 MG tablet Take 100 mg by mouth daily.   Yes Historical Provider, MD  zolpidem (AMBIEN) 10 MG tablet Take 1 tablet (10 mg total) by mouth at bedtime as needed. For sleep. 03/12/12  Yes Doe-Hyun Sherran Needs, DO     Positive ROS: All other systems have been reviewed and were otherwise negative with the exception of those mentioned in the HPI and as above.  Physical Exam: General: Alert, no acute distress Cardiovascular: No pedal edema Respiratory:  No cyanosis, no use of accessory musculature GI: No organomegaly, abdomen is soft and non-tender Skin: No lesions in the area of chief complaint Neurologic: Sensation intact distally Psychiatric: Patient is competent for consent with normal mood and affect Lymphatic: No axillary or cervical lymphadenopathy  MUSCULOSKELETAL: Right shoulder active motion is 0-100. Rotator cuff strength is fair, but she is fairly stiff with external rotation. She is positive crepitance.  Assessment: Right shoulder avascular necrosis  Plan: Plan for Procedure(s): Right SHOULDER HEMI-ARTHROPLASTY versus total shoulder replacement. I think that her glenoid is normal, and so likely this will be a hemiarthroplasty. Her disease is primarily on the humeral side.  The risks benefits and alternatives were discussed with the patient including but not limited to the risks of nonoperative treatment, versus surgical intervention including infection, bleeding, nerve injury,  blood clots, cardiopulmonary complications, morbidity, mortality, among others, and they were willing to proceed. We have also discussed the risks of dislocation, subscapularis rupture, the need for revision surgery, incomplete relief of pain, among others.  Talon Witting P, MD Cell [phone] Pager [phone]  06/05/2012 9:45 AM

## 2012-06-05 NOTE — Transfer of Care (Signed)
Immediate Anesthesia Transfer of Care Note  Patient: Sandra Craig  Procedure(s) Performed: Procedure(s) (LRB) with comments: TOTAL SHOULDER ARTHROPLASTY (Right) - RIGHT SHOULDER TOTAL ARTHROPLASTY, HEMIARTHROPLASTY, SHOULDER, FOR ARTHRITIS SHOULDER HEMI-ARTHROPLASTY (Right) - FOR ARTHRITIS  Patient Location: PACU  Anesthesia Type:General  Level of Consciousness: awake, alert  and oriented  Airway & Oxygen Therapy: Patient Spontanous Breathing and Patient connected to nasal cannula oxygen  Post-op Assessment: Report given to PACU RN, Post -op Vital signs reviewed and stable and Patient moving all extremities X 4  Post vital signs: Reviewed and stable  Complications: No apparent anesthesia complications

## 2012-06-06 ENCOUNTER — Encounter (HOSPITAL_COMMUNITY): Payer: Self-pay | Admitting: Orthopedic Surgery

## 2012-06-06 LAB — BASIC METABOLIC PANEL
BUN: 17 mg/dL (ref 6–23)
CO2: 28 mEq/L (ref 19–32)
Calcium: 8.8 mg/dL (ref 8.4–10.5)
Chloride: 105 mEq/L (ref 96–112)
Creatinine, Ser: 1.18 mg/dL — ABNORMAL HIGH (ref 0.50–1.10)
GFR calc Af Amer: 53 mL/min — ABNORMAL LOW (ref >90)
GFR calc non Af Amer: 46 mL/min — ABNORMAL LOW (ref >90)
Glucose, Bld: 144 mg/dL — ABNORMAL HIGH (ref 70–99)
Potassium: 4.3 mEq/L (ref 3.5–5.1)
Sodium: 140 mEq/L (ref 135–145)

## 2012-06-06 LAB — CBC
HCT: 33.9 % — ABNORMAL LOW (ref 36.0–46.0)
Hemoglobin: 10.8 g/dL — ABNORMAL LOW (ref 12.0–15.0)
MCH: 28.6 pg (ref 26.0–34.0)
MCHC: 31.9 g/dL (ref 30.0–36.0)
MCV: 89.9 fL (ref 78.0–100.0)
Platelets: 154 10*3/uL (ref 150–400)
RBC: 3.77 MIL/uL — ABNORMAL LOW (ref 3.87–5.11)
RDW: 15.5 % (ref 11.5–15.5)
WBC: 9.5 10*3/uL (ref 4.0–10.5)

## 2012-06-06 MED ORDER — PNEUMOCOCCAL VAC POLYVALENT 25 MCG/0.5ML IJ INJ
0.5000 mL | INJECTION | INTRAMUSCULAR | Status: AC
  Administered 2012-06-07: 0.5 mL via INTRAMUSCULAR
  Filled 2012-06-06: qty 0.5

## 2012-06-06 MED ORDER — ADULT MULTIVITAMIN W/MINERALS CH
1.0000 | ORAL_TABLET | Freq: Every day | ORAL | Status: DC
  Administered 2012-06-06 – 2012-06-08 (×4): 1 via ORAL
  Filled 2012-06-06 (×4): qty 1

## 2012-06-06 MED ORDER — OXYCODONE-ACETAMINOPHEN 10-325 MG PO TABS: 1.0000 | ORAL_TABLET | Freq: Four times a day (QID) | ORAL | Status: DC | PRN

## 2012-06-06 MED ORDER — PROMETHAZINE HCL 25 MG PO TABS: 25.0000 mg | ORAL_TABLET | Freq: Four times a day (QID) | ORAL | Status: DC | PRN

## 2012-06-06 MED ORDER — METHOCARBAMOL 500 MG PO TABS: 500.0000 mg | ORAL_TABLET | Freq: Four times a day (QID) | ORAL | Status: DC

## 2012-06-06 NOTE — Progress Notes (Signed)
Subjective:  Patient reports pain as mild.  Doing well, considering going home today.  Objective:   VITALS:   Filed Vitals:   06/05/12 1740 06/05/12 2049 06/06/12 0016 06/06/12 0501  BP: 111/66 115/62 106/57 105/60  Pulse: 78 87 85 85  Temp: 97.4 F (36.3 C) 97.5 F (36.4 C) 97.9 F (36.6 C) 97.9 F (36.6 C)  TempSrc: Oral Tympanic Oral Oral  Resp: 16 18 18 16   Height:      Weight:      SpO2: 97% 100% 95% 96%    Radial nerve is functioning, she has some paresthesias from a persistent nerve block, but these are wearing off. Wounds are clean, with intact dressing.  LABS  Results for orders placed during the hospital encounter of 06/05/12 (from the past 24 hour(s))  CBC     Status: Abnormal   Collection Time   06/06/12  5:00 AM      Component Value Range   WBC 9.5  4.0 - 10.5 K/uL   RBC 3.77 (*) 3.87 - 5.11 MIL/uL   Hemoglobin 10.8 (*) 12.0 - 15.0 g/dL   HCT 65.7 (*) 84.6 - 96.2 %   MCV 89.9  78.0 - 100.0 fL   MCH 28.6  26.0 - 34.0 pg   MCHC 31.9  30.0 - 36.0 g/dL   RDW 95.2  84.1 - 32.4 %   Platelets 154  150 - 400 K/uL  BASIC METABOLIC PANEL     Status: Abnormal   Collection Time   06/06/12  5:00 AM      Component Value Range   Sodium 140  135 - 145 mEq/L   Potassium 4.3  3.5 - 5.1 mEq/L   Chloride 105  96 - 112 mEq/L   CO2 28  19 - 32 mEq/L   Glucose, Bld 144 (*) 70 - 99 mg/dL   BUN 17  6 - 23 mg/dL   Creatinine, Ser 4.01 (*) 0.50 - 1.10 mg/dL   Calcium 8.8  8.4 - 02.7 mg/dL   GFR calc non Af Amer 46 (*) >90 mL/min   GFR calc Af Amer 53 (*) >90 mL/min    Ct Chest Wo Contrast  06/04/2012  *RADIOLOGY REPORT*  Clinical Data: Infiltrate in the lingula.  CT CHEST WITHOUT CONTRAST  Technique:  Multidetector CT imaging of the chest was performed following the standard protocol without IV contrast.  Comparison: CT scans dated 02/07/2012 and 06/11/2010  Findings: A small area of inflammatory disease in the lingula of the left lung is more cavitary than on the  prior exam but overall size is unchanged.  There is a small inflammatory cavitary lesion in the right lower lobe medially adjacent to the spine, unchanged. Previously demonstrated inflammatory changes in the superior segment of the right lower lobe have almost resolved with residual slight pleural thickening posteriorly.  Slight chronic atelectasis or scarring at both lung bases posteriorly.  Heart size and vascularity are normal.  The patient has avascular necrosis of the right humeral head with some fragmentation of the humeral head.  This was previously demonstrated on a shoulder MRI dated 01/16/2012.  IMPRESSION:  1.  Post inflammatory changes in the lingula of the left lung and in the right lower lobe and at both lung bases. 2.  None of the findings worrisome for malignancy.   Original Report Authenticated By: Francene Boyers, M.D.    Dg Shoulder Right Port  06/05/2012  *RADIOLOGY REPORT*  Clinical Data: Avascular necrosis of  the right humeral head  PORTABLE RIGHT SHOULDER - 2+ VIEW  Comparison: MRI 01/16/2012  Findings: Portable semi erect AP view of the shoulder demonstrates satisfactory appearance status post right shoulder hemiarthroplasty. No adverse features.  IMPRESSION: Satisfactory postoperative appearance.   Original Report Authenticated By: Davonna Belling, M.D.     Assessment/Plan: 1 Day Post-Op   Principal Problem:  *AVN (avascular necrosis of bone), shoulder   Possible discharge home today, depending on therapy and pain control.   Sandra Craig 06/06/2012, 8:55 AM   Teryl Lucy, MD Cell [phone] Pager [phone]

## 2012-06-06 NOTE — Discharge Summary (Signed)
Physician Discharge Summary  Patient ID: Sandra Craig MRN: [id number] DOB/AGE: 70-26-1942 70 y.o.  Admit date: 06/05/2012 Discharge date: 06/06/2012  Admission Diagnoses:  AVN (avascular necrosis of bone)  Discharge Diagnoses:  Principal Problem:  *AVN (avascular necrosis of bone), shoulder   Past Medical History  Diagnosis Date  . IBS (irritable bowel syndrome)   . Hypertension   . Hyperlipidemia   . IC (interstitial cystitis)   . Endometriosis   . Polycystic kidney disease   . Osteopenia   . Cystitis   . Fibromyalgia   . Palpitations   . Chronic insomnia   . Pulmonary nodule 12/08    5 mm Anterior RUL  . Hiatal hernia   . Diverticulosis of colon (without mention of hemorrhage)   . Internal hemorrhoid   . Family history of malignant neoplasm of gastrointestinal tract   . Gastritis   . Small bowel obstruction   . Osteonecrosis   . History of gallstones   . Diverticulitis of intestine without perforation or abscess without bleeding     Patient did have abscess but noperforation  . AVN (avascular necrosis of bone), 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    Surgeries: Procedure(s): TOTAL SHOULDER ARTHROPLASTY SHOULDER HEMI-ARTHROPLASTY on 06/05/2012   Consultants (if any):    Discharged Condition: Improved  Hospital Course: Sandra Craig is an 71 y.o. female who was admitted 06/05/2012 with a diagnosis of AVN (avascular necrosis of bone) and went to the operating room on 06/05/2012 and underwent the above named procedures.    She was given perioperative antibiotics:  Anti-infectives     Start     Dose/Rate Route Frequency Ordered Stop   06/05/12 1900   ceFAZolin (ANCEF) IVPB 2 g/50 mL premix        2 g 100 mL/hr over 30 Minutes Intravenous Every 6 hours 06/05/12 1752 06/06/12 0658   06/05/12 0600   ceFAZolin (ANCEF) IVPB 2 g/50 mL premix  Status:  Discontinued        2 g 100 mL/hr over 30 Minutes Intravenous 60 min pre-op 06/04/12 1418 06/05/12 1741        .  She was  given sequential compression devices, early ambulation, and lovenox for DVT prophylaxis.  She benefited maximally from the hospital stay and there were no complications.    Recent vital signs:  Filed Vitals:   06/06/12 0501  BP: 105/60  Pulse: 85  Temp: 97.9 F (36.6 C)  Resp: 16    Recent laboratory studies:  Lab Results  Component Value Date   HGB 10.8* 06/06/2012   HGB 12.8 05/30/2012   HGB 9.9* 02/14/2012   Lab Results  Component Value Date   WBC 9.5 06/06/2012   PLT 154 06/06/2012   Lab Results  Component Value Date   INR 1.07 05/30/2012   Lab Results  Component Value Date   NA 140 06/06/2012   K 4.3 06/06/2012   CL 105 06/06/2012   CO2 28 06/06/2012   BUN 17 06/06/2012   CREATININE 1.18* 06/06/2012   GLUCOSE 144* 06/06/2012    Discharge Medications:     Medication List     As of 06/06/2012  8:54 AM    TAKE these medications         CALCIUM + D PO   Take 2 tablets by mouth daily. 1200 OF CALCIUM +D      gabapentin 100 MG capsule   Commonly known as: NEURONTIN   Take 1 capsule (100 mg total) by mouth daily.  losartan 50 MG tablet   Commonly known as: COZAAR   Take 50 mg by mouth daily.      methocarbamol 500 MG tablet   Commonly known as: ROBAXIN   Take 1 tablet (500 mg total) by mouth 4 (four) times daily.      multivitamin capsule   Take 1 capsule by mouth daily.      oxyCODONE-acetaminophen 10-325 MG per tablet   Commonly known as: PERCOCET   Take 1-2 tablets by mouth every 6 (six) hours as needed for pain. MAXIMUM TOTAL ACETAMINOPHEN DOSE IS 4000 MG PER DAY      promethazine 25 MG tablet   Commonly known as: PHENERGAN   Take 1 tablet (25 mg total) by mouth every 6 (six) hours as needed for nausea.      rosuvastatin 20 MG tablet   Commonly known as: CRESTOR   Take 20 mg by mouth daily.      sertraline 100 MG tablet   Commonly known as: ZOLOFT   Take 100 mg by mouth daily.      zolpidem 10 MG tablet   Commonly known as: AMBIEN   Take  1 tablet (10 mg total) by mouth at bedtime as needed. For sleep.        Diagnostic Studies: Ct Chest Wo Contrast  06/04/2012  *RADIOLOGY REPORT*  Clinical Data: Infiltrate in the lingula.  CT CHEST WITHOUT CONTRAST  Technique:  Multidetector CT imaging of the chest was performed following the standard protocol without IV contrast.  Comparison: CT scans dated 02/07/2012 and 06/11/2010  Findings: A small area of inflammatory disease in the lingula of the left lung is more cavitary than on the prior exam but overall size is unchanged.  There is a small inflammatory cavitary lesion in the right lower lobe medially adjacent to the spine, unchanged. Previously demonstrated inflammatory changes in the superior segment of the right lower lobe have almost resolved with residual slight pleural thickening posteriorly.  Slight chronic atelectasis or scarring at both lung bases posteriorly.  Heart size and vascularity are normal.  The patient has avascular necrosis of the right humeral head with some fragmentation of the humeral head.  This was previously demonstrated on a shoulder MRI dated 01/16/2012.  IMPRESSION:  1.  Post inflammatory changes in the lingula of the left lung and in the right lower lobe and at both lung bases. 2.  None of the findings worrisome for malignancy.   Original Report Authenticated By: Francene Boyers, M.D.    Dg Shoulder Right Port  06/05/2012  *RADIOLOGY REPORT*  Clinical Data: Avascular necrosis of the right humeral head  PORTABLE RIGHT SHOULDER - 2+ VIEW  Comparison: MRI 01/16/2012  Findings: Portable semi erect AP view of the shoulder demonstrates satisfactory appearance status post right shoulder hemiarthroplasty. No adverse features.  IMPRESSION: Satisfactory postoperative appearance.   Original Report Authenticated By: Davonna Belling, M.D.     Disposition: 01-Home or Self Care      Discharge Orders    Future Orders Please Complete By Expires   Diet general      Call MD / Call  911      Comments:   If you experience chest pain or shortness of breath, CALL 911 and be transported to the hospital emergency room.  If you develope a fever above 101 F, pus (white drainage) or increased drainage or redness at the wound, or calf pain, call your surgeon's office.   Discharge instructions  Comments:   Change dressing in 3 days and reapply fresh dressing, unless you have a splint (half cast).  If you have a splint/cast, just leave in place until your follow-up appointment.    Keep wounds dry for 3 weeks.  Leave steri-strips in place on skin.  Do not apply lotion or anything to the wound.   Constipation Prevention      Comments:   Drink plenty of fluids.  Prune juice may be helpful.  You may use a stool softener, such as Colace (over the counter) 100 mg twice a day.  Use MiraLax (over the counter) for constipation as needed.      Follow-up Information    Follow up with Eulas Post, MD. Schedule an appointment as soon as possible for a visit in 2 weeks.   Contact information:   [address] [place] [address] [phone]           Signed: Eulas Post 06/06/2012, 8:54 AM

## 2012-06-06 NOTE — Evaluation (Signed)
Occupational Therapy Evaluation Patient Details Name: Sandra Craig MRN: [id number] DOB: 05-12-1941 Today's Date: 06/06/2012 Time: 4098-1191 OT Time Calculation (min): 26 min  OT Assessment / Plan / Recommendation Clinical Impression  Pt 71 yo female s/p right TSA. Pt is doing very well and is supervision for most ADL's. Pt has great support system and feel she is safe to d/c home at this level. OT signing off.    OT Assessment  Progress rehab of shoulder as ordered by MD at follow-up appointment    Follow Up Recommendations  No OT follow up    Barriers to Discharge      Equipment Recommendations  None recommended by OT    Recommendations for Other Services    Frequency       Precautions / Restrictions Restrictions Weight Bearing Restrictions: Yes RUE Weight Bearing: Non weight bearing   Pertinent Vitals/Pain 2/10 pain     ADL  Eating/Feeding: Performed;Set up Where Assessed - Eating/Feeding: Edge of bed Upper Body Bathing: Performed;Minimal assistance;Right arm Where Assessed - Upper Body Bathing: Unsupported sitting Upper Body Dressing: Performed;Supervision/safety Where Assessed - Upper Body Dressing: Unsupported sitting Toilet Transfer: Simulated;Modified independent Toilet Transfer Method: Sit to Barista: Other (comment) (bed ) Tub/Shower Transfer: Simulated;Modified independent Tub/Shower Transfer Method: Science writer: Walk in shower Equipment Used: Gait belt Transfers/Ambulation Related to ADLs: mod independent for tarnsfers and ambulation. ADL Comments: Educated pt on sling wear, UE positioning, correct technique for UE dressing and elbow, wrist and hand exercises. Pt is independent in completing exercises. Requires min (A) from family member to bathe under RUE. Also able to don/doff brace and dress RUE with supervision.     OT Diagnosis:    OT Problem List:   OT Treatment Interventions:     OT Goals      Visit Information  Last OT Received On: 06/06/12 Assistance Needed: +1    Subjective Data  Subjective: They tell me when the nerve block wears off I am going to be in some pain  Patient Stated Goal: To go home    Prior Functioning     Home Living Lives With: Alone Available Help at Discharge: Family Type of Home: House Home Access: Stairs to enter [employer] of Steps: 2 (has access to ramp if going in through garage ) Home Layout: One level Bathroom Shower/Tub: Health visitor: Standard Home Adaptive Equipment: Tub transfer bench Prior Function Level of Independence: Independent Able to Take Stairs?: Yes Driving: Yes Vocation: Retired Musician: No difficulties Dominant Hand: Right         Vision/Perception     Cognition  Overall Cognitive Status: Appears within functional limits for tasks assessed/performed Arousal/Alertness: Awake/alert Orientation Level: Oriented X4 / Intact Behavior During Session: [facility] for tasks performed    Extremity/Trunk Assessment Trunk Assessment Trunk Assessment: Normal     Mobility Bed Mobility Bed Mobility: Supine to Sit;Sitting - Scoot to Edge of Bed Supine to Sit: 6: Modified independent (Device/Increase time);HOB elevated Sitting - Scoot to Edge of Bed: 7: Independent Details for Bed Mobility Assistance: requires no physical (A)  Transfers Transfers: Sit to Stand;Stand to Sit Sit to Stand: 6: Modified independent (Device/Increase time);From bed;With upper extremity assist Stand to Sit: 6: Modified independent (Device/Increase time);With upper extremity assist;To bed     Shoulder Instructions Donning/doffing shirt without moving shoulder: Supervision/safety Method for sponge bathing under operated UE: Minimal assistance Donning/doffing sling/immobilizer: Supervision/safety Correct positioning of sling/immobilizer: Independent ROM for elbow, wrist and  digits of operated UE:  Independent Sling wearing schedule (on at all times/off for ADL's): Independent Proper positioning of operated UE when showering: Independent Positioning of UE while sleeping: Independent   Exercise Shoulder Exercises Elbow Flexion: AROM;Right Elbow Extension: AROM;Right Wrist Flexion: AROM;Right Wrist Extension: AROM;Right Digit Composite Flexion: AROM;Right Composite Extension: AROM;Right Neck Flexion: AROM Neck Extension: AROM Neck Lateral Flexion - Right: AROM Neck Lateral Flexion - Left: AROM   Balance Balance Balance Assessed: Yes Standardized Balance Assessment Standardized Balance Assessment: Dynamic Gait Index Dynamic Gait Index Level Surface: Normal Change in Gait Speed: Normal Gait with Horizontal Head Turns: Normal Gait with Vertical Head Turns: Normal Gait and Pivot Turn: Normal Step Over Obstacle: Normal Step Around Obstacles: Normal Steps: Normal Total Score: 24  High Level Balance High Level Balance Activites: Backward walking;Direction changes;Turns;Sudden stops;Head turns;Side stepping;Braiding   End of Session OT - End of Session Equipment Utilized During Treatment: Gait belt Activity Tolerance: Patient tolerated treatment well Patient left: in bed;with call bell/phone within reach;with family/visitor present Nurse Communication: Mobility status  GO     Sandra Craig 06/06/2012, 10:22 AM

## 2012-06-06 NOTE — Progress Notes (Signed)
I agree with the following treatment note after reviewing documentation.   Johnston, Ramiz Turpin Brynn   OTR/L Pager: [phone] Office: [phone] .   

## 2012-06-06 NOTE — Progress Notes (Signed)
UR COMPLETED  

## 2012-06-06 NOTE — Evaluation (Signed)
I agree with the following treatment note after reviewing documentation.   Johnston, Tashaun Obey Brynn   OTR/L Pager: [phone] Office: [phone] .   

## 2012-06-06 NOTE — Progress Notes (Signed)
Occupational Therapy Discharge Patient Details Name: ZOII FLORER MRN: [id number] DOB: Jul 16, 1941 Today's Date: 06/06/2012 Time: 7829-5621 OT Time Calculation (min): 26 min  Patient discharged from OT services secondary to Pt educated on shoulder protocol and able to perform most ADL's at supervision level. Has great support system, feel pt is at safe level to d/c home at this time. No further acute OT needs at this time.     Please see latest therapy progress note for current level of functioning and progress toward goals.    Progress and discharge plan discussed with patient and/or caregiver: Patient/Caregiver agrees with plan  GO     [name] 06/06/2012, 10:23 AM

## 2012-06-07 MED ORDER — PSYLLIUM 95 % PO PACK
1.0000 | PACK | Freq: Every day | ORAL | Status: DC
  Administered 2012-06-07 – 2012-06-08 (×2): 1 via ORAL
  Filled 2012-06-07 (×4): qty 1

## 2012-06-07 NOTE — Progress Notes (Signed)
CARE MANAGEMENT NOTE 06/07/2012  Patient:  Sandra Craig, Sandra Craig   Account Number:  [id number]  Date Initiated:  06/07/2012  Documentation initiated by:  Vance Peper  Subjective/Objective Assessment:   71 yr old female s/p right shoulder Hemiarthroplasty     Action/Plan:   Patient preoperatively setup with [facility], no changes.   Anticipated DC Date:  06/07/2012   Anticipated DC Plan:  HOME W HOME HEALTH SERVICES      DC Planning Services  CM consult      [facility] Choice  HOME HEALTH   Choice offered to / List presented to:          [facility] arranged  HH-2 PT      Status of service:  Completed, signed off Medicare Important Message given?   (If response is "NO", the following Medicare IM given date fields will be blank) Date Medicare IM given:   Date Additional Medicare IM given:    Discharge Disposition:  HOME W HOME HEALTH SERVICES  Per UR Regulation:    If discussed at Long Length of Stay Meetings, dates discussed:    Comments:

## 2012-06-07 NOTE — Progress Notes (Signed)
Patient ID: Sandra Craig, female   DOB: 09-May-1941, 71 y.o.   MRN: [id number]     Subjective:  Patient reports pain as mild to moderate.  She is very worried about DC.  Pain was worse yesterday and is only somewhat better today.  Objective:   VITALS:   Filed Vitals:   06/06/12 1308 06/06/12 2314 06/07/12 0309 06/07/12 0646  BP: 97/52 98/82 119/60 98/75  Pulse: 82 85 100 100  Temp: 97.5 F (36.4 C) 97.1 F (36.2 C)  98.6 F (37 C)  TempSrc:      Resp: 18 18  18   Height:      Weight:      SpO2: 97% 95%  95%    ABD soft Sensation intact distally Dorsiflexion/Plantar flexion intact Incision: dressing C/D/I and no drainage Pain controlled by PO meds.  LABS  No results found for this or any previous visit (from the past 24 hour(s)).  Dg Shoulder Right Port  06/05/2012  *RADIOLOGY REPORT*  Clinical Data: Avascular necrosis of the right humeral head  PORTABLE RIGHT SHOULDER - 2+ VIEW  Comparison: MRI 01/16/2012  Findings: Portable semi erect AP view of the shoulder demonstrates satisfactory appearance status post right shoulder hemiarthroplasty. No adverse features.  IMPRESSION: Satisfactory postoperative appearance.   Original Report Authenticated By: Davonna Belling, M.D.     Assessment/Plan: 2 Days Post-Op   Principal Problem:  *AVN (avascular necrosis of bone), shoulder   Advance diet Up with therapy Plan for discharge tomorrow    Haskel Khan 06/07/2012, 9:52 AM   Teryl Lucy, MD Cell [phone] Pager [phone]

## 2012-06-08 NOTE — Discharge Summary (Signed)
Physician Discharge Summary  Patient ID: Sandra Craig MRN: [id number] DOB/AGE: 70/19/1942 70 y.o.  Admit date: 06/05/2012 Discharge date: 06/08/2012  Admission Diagnoses:  AVN (avascular necrosis of bone)  Discharge Diagnoses:  Principal Problem:  *AVN (avascular necrosis of bone), shoulder   Past Medical History  Diagnosis Date  . IBS (irritable bowel syndrome)   . Hypertension   . Hyperlipidemia   . IC (interstitial cystitis)   . Endometriosis   . Polycystic kidney disease   . Osteopenia   . Cystitis   . Fibromyalgia   . Palpitations   . Chronic insomnia   . Pulmonary nodule 12/08    5 mm Anterior RUL  . Hiatal hernia   . Diverticulosis of colon (without mention of hemorrhage)   . Internal hemorrhoid   . Family history of malignant neoplasm of gastrointestinal tract   . Gastritis   . Small bowel obstruction   . Osteonecrosis   . History of gallstones   . Diverticulitis of intestine without perforation or abscess without bleeding     Patient did have abscess but noperforation  . AVN (avascular necrosis of bone), 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    Surgeries: Procedure(s): TOTAL SHOULDER ARTHROPLASTY SHOULDER HEMI-ARTHROPLASTY on 06/05/2012   Consultants (if any):    Discharged Condition: Improved  Hospital Course: Sandra Craig is an 71 y.o. female who was admitted 06/05/2012 with a diagnosis of AVN (avascular necrosis of bone) and went to the operating room on 06/05/2012 and underwent the above named procedures.    She was given perioperative antibiotics:  Anti-infectives     Start     Dose/Rate Route Frequency Ordered Stop   06/05/12 1900   ceFAZolin (ANCEF) IVPB 2 g/50 mL premix        2 g 100 mL/hr over 30 Minutes Intravenous Every 6 hours 06/05/12 1752 06/06/12 0658   06/05/12 0600   ceFAZolin (ANCEF) IVPB 2 g/50 mL premix  Status:  Discontinued        2 g 100 mL/hr over 30 Minutes Intravenous 60 min pre-op 06/04/12 1418 06/05/12 1741        .  She was  given sequential compression devices, early ambulation, and lovenox for DVT prophylaxis.  She benefited maximally from the hospital stay and there were no complications.    Recent vital signs:  Filed Vitals:   06/08/12 0553  BP: 101/43  Pulse: 81  Temp: 98.4 F (36.9 C)  Resp: 18    Recent laboratory studies:  Lab Results  Component Value Date   HGB 10.8* 06/06/2012   HGB 12.8 05/30/2012   HGB 9.9* 02/14/2012   Lab Results  Component Value Date   WBC 9.5 06/06/2012   PLT 154 06/06/2012   Lab Results  Component Value Date   INR 1.07 05/30/2012   Lab Results  Component Value Date   NA 140 06/06/2012   K 4.3 06/06/2012   CL 105 06/06/2012   CO2 28 06/06/2012   BUN 17 06/06/2012   CREATININE 1.18* 06/06/2012   GLUCOSE 144* 06/06/2012    Discharge Medications:     Medication List     As of 06/08/2012  7:37 AM    TAKE these medications         CALCIUM + D PO   Take 2 tablets by mouth daily. 1200 OF CALCIUM +D      gabapentin 100 MG capsule   Commonly known as: NEURONTIN   Take 1 capsule (100 mg total) by mouth daily.  losartan 50 MG tablet   Commonly known as: COZAAR   Take 50 mg by mouth daily.      methocarbamol 500 MG tablet   Commonly known as: ROBAXIN   Take 1 tablet (500 mg total) by mouth 4 (four) times daily.      multivitamin capsule   Take 1 capsule by mouth daily.      oxyCODONE-acetaminophen 10-325 MG per tablet   Commonly known as: PERCOCET   Take 1-2 tablets by mouth every 6 (six) hours as needed for pain. MAXIMUM TOTAL ACETAMINOPHEN DOSE IS 4000 MG PER DAY      promethazine 25 MG tablet   Commonly known as: PHENERGAN   Take 1 tablet (25 mg total) by mouth every 6 (six) hours as needed for nausea.      rosuvastatin 20 MG tablet   Commonly known as: CRESTOR   Take 20 mg by mouth daily.      sertraline 100 MG tablet   Commonly known as: ZOLOFT   Take 100 mg by mouth daily.      zolpidem 10 MG tablet   Commonly known as: AMBIEN   Take  1 tablet (10 mg total) by mouth at bedtime as needed. For sleep.        Diagnostic Studies: Ct Chest Wo Contrast  06/04/2012  *RADIOLOGY REPORT*  Clinical Data: Infiltrate in the lingula.  CT CHEST WITHOUT CONTRAST  Technique:  Multidetector CT imaging of the chest was performed following the standard protocol without IV contrast.  Comparison: CT scans dated 02/07/2012 and 06/11/2010  Findings: A small area of inflammatory disease in the lingula of the left lung is more cavitary than on the prior exam but overall size is unchanged.  There is a small inflammatory cavitary lesion in the right lower lobe medially adjacent to the spine, unchanged. Previously demonstrated inflammatory changes in the superior segment of the right lower lobe have almost resolved with residual slight pleural thickening posteriorly.  Slight chronic atelectasis or scarring at both lung bases posteriorly.  Heart size and vascularity are normal.  The patient has avascular necrosis of the right humeral head with some fragmentation of the humeral head.  This was previously demonstrated on a shoulder MRI dated 01/16/2012.  IMPRESSION:  1.  Post inflammatory changes in the lingula of the left lung and in the right lower lobe and at both lung bases. 2.  None of the findings worrisome for malignancy.   Original Report Authenticated By: Francene Boyers, M.D.    Dg Shoulder Right Port  06/05/2012  *RADIOLOGY REPORT*  Clinical Data: Avascular necrosis of the right humeral head  PORTABLE RIGHT SHOULDER - 2+ VIEW  Comparison: MRI 01/16/2012  Findings: Portable semi erect AP view of the shoulder demonstrates satisfactory appearance status post right shoulder hemiarthroplasty. No adverse features.  IMPRESSION: Satisfactory postoperative appearance.   Original Report Authenticated By: Davonna Belling, M.D.     Disposition: 01-Home or Self Care      Discharge Orders    Future Orders Please Complete By Expires   Diet general      Call MD / Call  [phone]      Comments:   If you experience chest pain or shortness of breath, CALL 911 and be transported to the hospital emergency room.  If you develope a fever above 101 F, pus (white drainage) or increased drainage or redness at the wound, or calf pain, call your surgeon's office.   Discharge instructions  Comments:   Change dressing in 3 days and reapply fresh dressing, unless you have a splint (half cast).  If you have a splint/cast, just leave in place until your follow-up appointment.    Keep wounds dry for 3 weeks.  Leave steri-strips in place on skin.  Do not apply lotion or anything to the wound.   Constipation Prevention      Comments:   Drink plenty of fluids.  Prune juice may be helpful.  You may use a stool softener, such as Colace (over the counter) 100 mg twice a day.  Use MiraLax (over the counter) for constipation as needed.      Follow-up Information    Follow up with Eulas Post, MD. Schedule an appointment as soon as possible for a visit in 2 weeks.   Contact information:   [address] [place] [address] [phone]           Signed: Eulas Post 06/08/2012, 7:37 AM

## 2012-06-08 NOTE — Progress Notes (Signed)
Patient ID: Sandra Craig, female   DOB: [birth year], 71 y.o.   MRN: [id number]     Subjective:  Patient reports pain as mild to moderate.  Patient states that she is ready to go home.  Objective:   VITALS:   Filed Vitals:   06/07/12 0646 06/07/12 1400 06/07/12 2234 06/08/12 0553  BP: 98/75 118/63 104/61 101/43  Pulse: 100 93 88 81  Temp: 98.6 F (37 C) 98.2 F (36.8 C) 98.6 F (37 C) 98.4 F (36.9 C)  TempSrc:      Resp: 18 16 18 18   Height:      Weight:      SpO2: 95% 96% 96% 94%    ABD soft Sensation intact distally Dorsiflexion/Plantar flexion intact Incision: dressing C/D/I and no drainage  LABS  No results found for this or any previous visit (from the past 24 hour(s)).  No results found.  Assessment/Plan: 3 Days Post-Op   Principal Problem:  *AVN (avascular necrosis of bone), shoulder   [facility] diet Up with therapy Discharge home with home health DC papers are ready and RX are in the chart   Haskel Khan 06/08/2012, 7:13 AM   Teryl Lucy, MD Cell [phone] Pager [phone]

## 2012-06-08 NOTE — Progress Notes (Signed)
OT NOTE  OT requested to return to room and address patient questions regarding d/c home today. Pt fully clothed and sitting on couch in room on arrival. Pt states "I had OT yesterday and today Dr Dion Saucier says to see him in two weeks What am I really suppose to do?" Pt educated on MD Landau order for OT immediately after surgery to cover all precautions, sling, adl, balance and provided handouts of education. Pt provided reeducation on hand wrist and digit ROM, Nwb Rt UE and sling positioning. Pt found to have sling slightly laterally shifted with wrist extending out of sling. Pt states "no one has fixed it like this". Pt educated that OT will not visit patient again while in acute care phase of the hospital, pt will follow up with MD Dion Saucier at scheduled appointment and RN will address these appointment needs more detail at discharge. MD Dion Saucier will recommend follow up therapy in two weeks if he feels it is appropriate but it could be up to 5 weeks before MD Landau allows increased activity that it is MD Landuau decision and it will be individualized to the patient's healing during this second weeks. Pt given opportunity to ask questions, OT answering all questions and at end of session Pt with no questions.  Time spent : 12 minutes   Lucile Shutters   OTR/L Pager: [phone] Office: [phone] .

## 2012-06-11 DIAGNOSIS — Z4789 Encounter for other orthopedic aftercare: Secondary | ICD-10-CM | POA: Diagnosis not present

## 2012-06-20 ENCOUNTER — Ambulatory Visit: Payer: Medicare Other | Admitting: Critical Care Medicine

## 2012-06-26 DIAGNOSIS — N2581 Secondary hyperparathyroidism of renal origin: Secondary | ICD-10-CM | POA: Diagnosis not present

## 2012-06-26 DIAGNOSIS — I10 Essential (primary) hypertension: Secondary | ICD-10-CM | POA: Diagnosis not present

## 2012-06-27 ENCOUNTER — Ambulatory Visit (INDEPENDENT_AMBULATORY_CARE_PROVIDER_SITE_OTHER): Payer: Medicare Other | Admitting: Internal Medicine

## 2012-06-27 ENCOUNTER — Ambulatory Visit: Payer: Medicare Other | Admitting: Internal Medicine

## 2012-06-27 VITALS — BP 118/70 | HR 76 | Temp 97.6°F | Resp 16 | Ht 66.0 in | Wt 140.0 lb

## 2012-06-27 DIAGNOSIS — R918 Other nonspecific abnormal finding of lung field: Secondary | ICD-10-CM

## 2012-06-27 DIAGNOSIS — E785 Hyperlipidemia, unspecified: Secondary | ICD-10-CM

## 2012-06-27 DIAGNOSIS — R7309 Other abnormal glucose: Secondary | ICD-10-CM

## 2012-06-27 DIAGNOSIS — I10 Essential (primary) hypertension: Secondary | ICD-10-CM | POA: Diagnosis not present

## 2012-06-27 DIAGNOSIS — M810 Age-related osteoporosis without current pathological fracture: Secondary | ICD-10-CM | POA: Diagnosis not present

## 2012-06-27 DIAGNOSIS — Q613 Polycystic kidney, unspecified: Secondary | ICD-10-CM

## 2012-06-27 DIAGNOSIS — R739 Hyperglycemia, unspecified: Secondary | ICD-10-CM

## 2012-06-27 DIAGNOSIS — M8700 Idiopathic aseptic necrosis of unspecified bone: Secondary | ICD-10-CM

## 2012-06-27 MED ORDER — LOSARTAN POTASSIUM 50 MG PO TABS: 50.0000 mg | ORAL_TABLET | Freq: Every day | ORAL | Status: DC

## 2012-06-27 MED ORDER — ROSUVASTATIN CALCIUM 20 MG PO TABS: 20.0000 mg | ORAL_TABLET | Freq: Every day | ORAL | Status: DC

## 2012-06-27 MED ORDER — GABAPENTIN 100 MG PO CAPS: 100.0000 mg | ORAL_CAPSULE | Freq: Every day | ORAL | Status: DC

## 2012-06-27 NOTE — Assessment & Plan Note (Signed)
Patient reports hyperglycemia during stressful episodes especially when she was admitted for diverticulitis during the summer of 2013. Screen for type 2 diabetes. Check A1c.

## 2012-06-27 NOTE — Progress Notes (Signed)
Subjective:    Patient ID: Sandra Craig, female    DOB: 25-Dec-1940, 71 y.o.   MRN: [id number]  HPI  71 year old white female with history of hypertension, hyperlipidemia and avascular necrosis of right shoulder for followup.   Interval medical history-patient underwent right shoulder surgery. She is still limited to wearing a sling. She has followup with orthopedic physician on December 11. He'll make a decision at that time whether she will start physical therapy.  Patient had followup CT scan as per Dr. Delford Field regarding pulmonary nodules. There were no worrisome findings. She was told by Dr. Delford Field that she will not need anny further surveillance CT scans of her chest.  During summer of 2013, patient had a bout of diverticulitis with abscess. Patient reports her blood sugars were somewhat labile during her hospitalization. She is concerned about possibility of prediabetes/diabetes.  Review of Systems Negative for fever or chills  Past Medical History  Diagnosis Date  . IBS (irritable bowel syndrome)   . Hypertension   . Hyperlipidemia   . IC (interstitial cystitis)   . Endometriosis   . Polycystic kidney disease   . Osteopenia   . Cystitis   . Fibromyalgia   . Palpitations   . Chronic insomnia   . Pulmonary nodule 12/08    5 mm Anterior RUL  . Hiatal hernia   . Diverticulosis of colon (without mention of hemorrhage)   . Internal hemorrhoid   . Family history of malignant neoplasm of gastrointestinal tract   . Gastritis   . Small bowel obstruction   . Osteonecrosis   . History of gallstones   . Diverticulitis of intestine without perforation or abscess without bleeding     Patient did have abscess but noperforation  . 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 Marital Status: Widowed    Spouse Name: N/A    Number of Children: N/A  . Years of Education: N/A   Occupational History  . Retired     [employer]    Social History Main Topics  . Smoking status: Former Games developer  . Smokeless tobacco: Never Used     Comment: Quit in 1977  . Alcohol Use: No  . Drug Use: No  . Sexually Active: No   Other Topics Concern  . Not on file   Social History Narrative  . No narrative on file    Past Surgical History  Procedure Date  . Cholecystectomy   . Pelvic laparoscopy   . Abdominal hysterectomy 1994    TAH,BSO FOR ENDOMETRIOSIS  . Oophorectomy 1994    TAH,BSO  . Total hip arthroplasty FALL OF 2008    rt. partial hip replacement  . Abdominal surgery 2011    small intestine blockage  . S/p right shoulder rotater cuff 200216/2011    Tear/adhesive capsulitis  . Sbo lap 11    Adhesions and small internal hernia  . Joint replacement 2008  . Gastroplasty 2011    small bowel resection -open  . Total shoulder arthroplasty 06/05/2012    Procedure: TOTAL SHOULDER ARTHROPLASTY;  Surgeon: Eulas Post, MD;  Location: [facility] OR;  Service: Orthopedics;  Laterality: Right;  RIGHT SHOULDER TOTAL ARTHROPLASTY, HEMIARTHROPLASTY, SHOULDER, FOR ARTHRITIS  . Shoulder hemi-arthroplasty 06/05/2012    Procedure: SHOULDER HEMI-ARTHROPLASTY;  Surgeon: Eulas Post, MD;  Location: [facility] OR;  Service: Orthopedics;  Laterality: Right;  FOR ARTHRITIS    Family History  Problem Relation Age of  Onset  . Heart disease Mother     MI at age 58  . Lymphoma Maternal Grandmother   . Colon cancer Paternal Grandmother   . COPD Father   . Emphysema Mother   . Thyroid disease Mother     Thyroidectomy/Benign    Allergies  Allergen Reactions  . Celecoxib   . Hydrocodone     Itching--has taken Percocet with no problem   . Prednisone Other (See Comments)    Made pt feel "crazy"  . Sulfonamide Derivatives     Current Outpatient Prescriptions on File Prior to Visit  Medication Sig Dispense Refill  . Calcium Carbonate-Vitamin D (CALCIUM + D PO) Take 2 tablets by mouth daily. 1200 OF CALCIUM +D      . methocarbamol  (ROBAXIN) 500 MG tablet Take 1 tablet (500 mg total) by mouth 4 (four) times daily.  75 tablet  1  . Multiple Vitamin (MULTIVITAMIN) capsule Take 1 capsule by mouth daily.        Marland Kitchen oxyCODONE-acetaminophen (PERCOCET) 10-325 MG per tablet Take 1-2 tablets by mouth every 6 (six) hours as needed for pain. MAXIMUM TOTAL ACETAMINOPHEN DOSE IS 4000 MG PER DAY  75 tablet  0  . sertraline (ZOLOFT) 100 MG tablet Take 100 mg by mouth daily.      Marland Kitchen zolpidem (AMBIEN) 10 MG tablet Take 1 tablet (10 mg total) by mouth at bedtime as needed. For sleep.  90 tablet  1    BP 140/76  Pulse 76  Temp 97.6 F (36.4 C)  Resp 16  Ht 5\' 6"  (1.676 m)  Wt 140 lb (63.504 kg)  BMI 22.60 kg/m2  LMP 08/25/1992       Objective:   Physical Exam  Constitutional: She is oriented to person, place, and time. She appears well-developed and well-nourished.  HENT:  Head: Normocephalic and atraumatic.  Eyes: EOM are normal. Pupils are equal, round, and reactive to light.  Cardiovascular: Normal rate, regular rhythm and normal heart sounds.   Pulmonary/Chest: Effort normal and breath sounds normal. She has no wheezes.  Abdominal: Bowel sounds are normal. There is no tenderness.  Neurological: She is oriented to person, place, and time. No cranial nerve deficit.  Skin: Skin is warm and dry.  Psychiatric: She has a normal mood and affect. Her behavior is normal.          Assessment & Plan:

## 2012-06-27 NOTE — Assessment & Plan Note (Signed)
Patient taking periodic Prolia injections.  She is due for next injection.  Nursing staff to clarify insurance coverage.

## 2012-06-27 NOTE — Assessment & Plan Note (Signed)
Followup CT of chest was unremarkable. Patient reports she was told by her pulmonary specialist that no further surveillance scans needed.

## 2012-06-27 NOTE — Patient Instructions (Addendum)
Please complete the following lab tests before your next follow up appointment: BMET - 401.9 CBCD - 285.9 A1c - 790.29

## 2012-06-27 NOTE — Assessment & Plan Note (Signed)
Followed by nephrologist 

## 2012-06-27 NOTE — Assessment & Plan Note (Signed)
Well controlled.  No change in medication.

## 2012-06-27 NOTE — Assessment & Plan Note (Signed)
Refilled Crestor.  Monitor LFTs yearly.

## 2012-06-27 NOTE — Assessment & Plan Note (Signed)
Management as per her orthopedic physician.

## 2012-06-29 ENCOUNTER — Ambulatory Visit (INDEPENDENT_AMBULATORY_CARE_PROVIDER_SITE_OTHER): Payer: Medicare Other | Admitting: Internal Medicine

## 2012-06-29 DIAGNOSIS — M810 Age-related osteoporosis without current pathological fracture: Secondary | ICD-10-CM

## 2012-06-29 MED ORDER — DENOSUMAB 60 MG/ML ~~LOC~~ SOLN
60.0000 mg | Freq: Once | SUBCUTANEOUS | Status: AC
  Administered 2012-06-29: 60 mg via SUBCUTANEOUS

## 2012-07-05 ENCOUNTER — Ambulatory Visit: Payer: Medicare Other | Admitting: Internal Medicine

## 2012-07-12 DIAGNOSIS — M6281 Muscle weakness (generalized): Secondary | ICD-10-CM | POA: Diagnosis not present

## 2012-07-12 DIAGNOSIS — Z96619 Presence of unspecified artificial shoulder joint: Secondary | ICD-10-CM | POA: Diagnosis not present

## 2012-07-12 DIAGNOSIS — M87029 Idiopathic aseptic necrosis of unspecified humerus: Secondary | ICD-10-CM | POA: Diagnosis not present

## 2012-07-12 DIAGNOSIS — M25519 Pain in unspecified shoulder: Secondary | ICD-10-CM | POA: Diagnosis not present

## 2012-07-16 DIAGNOSIS — Z96619 Presence of unspecified artificial shoulder joint: Secondary | ICD-10-CM | POA: Diagnosis not present

## 2012-07-16 DIAGNOSIS — M25519 Pain in unspecified shoulder: Secondary | ICD-10-CM | POA: Diagnosis not present

## 2012-07-16 DIAGNOSIS — M87029 Idiopathic aseptic necrosis of unspecified humerus: Secondary | ICD-10-CM | POA: Diagnosis not present

## 2012-07-16 DIAGNOSIS — M6281 Muscle weakness (generalized): Secondary | ICD-10-CM | POA: Diagnosis not present

## 2012-07-17 DIAGNOSIS — M6281 Muscle weakness (generalized): Secondary | ICD-10-CM | POA: Diagnosis not present

## 2012-07-17 DIAGNOSIS — M87029 Idiopathic aseptic necrosis of unspecified humerus: Secondary | ICD-10-CM | POA: Diagnosis not present

## 2012-07-17 DIAGNOSIS — M25519 Pain in unspecified shoulder: Secondary | ICD-10-CM | POA: Diagnosis not present

## 2012-07-17 DIAGNOSIS — Z96619 Presence of unspecified artificial shoulder joint: Secondary | ICD-10-CM | POA: Diagnosis not present

## 2012-07-19 DIAGNOSIS — Z96619 Presence of unspecified artificial shoulder joint: Secondary | ICD-10-CM | POA: Diagnosis not present

## 2012-07-19 DIAGNOSIS — M6281 Muscle weakness (generalized): Secondary | ICD-10-CM | POA: Diagnosis not present

## 2012-07-19 DIAGNOSIS — M87029 Idiopathic aseptic necrosis of unspecified humerus: Secondary | ICD-10-CM | POA: Diagnosis not present

## 2012-07-19 DIAGNOSIS — M25519 Pain in unspecified shoulder: Secondary | ICD-10-CM | POA: Diagnosis not present

## 2012-07-27 DIAGNOSIS — M6281 Muscle weakness (generalized): Secondary | ICD-10-CM | POA: Diagnosis not present

## 2012-07-27 DIAGNOSIS — Z96619 Presence of unspecified artificial shoulder joint: Secondary | ICD-10-CM | POA: Diagnosis not present

## 2012-07-27 DIAGNOSIS — M87029 Idiopathic aseptic necrosis of unspecified humerus: Secondary | ICD-10-CM | POA: Diagnosis not present

## 2012-07-27 DIAGNOSIS — M25519 Pain in unspecified shoulder: Secondary | ICD-10-CM | POA: Diagnosis not present

## 2012-08-09 DIAGNOSIS — Z96619 Presence of unspecified artificial shoulder joint: Secondary | ICD-10-CM | POA: Diagnosis not present

## 2012-08-09 DIAGNOSIS — M25519 Pain in unspecified shoulder: Secondary | ICD-10-CM | POA: Diagnosis not present

## 2012-08-09 DIAGNOSIS — M87029 Idiopathic aseptic necrosis of unspecified humerus: Secondary | ICD-10-CM | POA: Diagnosis not present

## 2012-08-09 DIAGNOSIS — M6281 Muscle weakness (generalized): Secondary | ICD-10-CM | POA: Diagnosis not present

## 2012-08-15 DIAGNOSIS — M87029 Idiopathic aseptic necrosis of unspecified humerus: Secondary | ICD-10-CM | POA: Diagnosis not present

## 2012-08-15 DIAGNOSIS — M25519 Pain in unspecified shoulder: Secondary | ICD-10-CM | POA: Diagnosis not present

## 2012-08-15 DIAGNOSIS — M6281 Muscle weakness (generalized): Secondary | ICD-10-CM | POA: Diagnosis not present

## 2012-08-15 DIAGNOSIS — Z96619 Presence of unspecified artificial shoulder joint: Secondary | ICD-10-CM | POA: Diagnosis not present

## 2012-08-17 DIAGNOSIS — M87029 Idiopathic aseptic necrosis of unspecified humerus: Secondary | ICD-10-CM | POA: Diagnosis not present

## 2012-08-17 DIAGNOSIS — M25519 Pain in unspecified shoulder: Secondary | ICD-10-CM | POA: Diagnosis not present

## 2012-08-17 DIAGNOSIS — Z96619 Presence of unspecified artificial shoulder joint: Secondary | ICD-10-CM | POA: Diagnosis not present

## 2012-08-17 DIAGNOSIS — M6281 Muscle weakness (generalized): Secondary | ICD-10-CM | POA: Diagnosis not present

## 2012-08-20 ENCOUNTER — Other Ambulatory Visit: Payer: Medicare Other

## 2012-08-22 ENCOUNTER — Other Ambulatory Visit: Payer: Medicare Other

## 2012-08-22 ENCOUNTER — Other Ambulatory Visit (INDEPENDENT_AMBULATORY_CARE_PROVIDER_SITE_OTHER): Payer: Medicare Other

## 2012-08-22 DIAGNOSIS — M6281 Muscle weakness (generalized): Secondary | ICD-10-CM | POA: Diagnosis not present

## 2012-08-22 DIAGNOSIS — M25519 Pain in unspecified shoulder: Secondary | ICD-10-CM | POA: Diagnosis not present

## 2012-08-22 DIAGNOSIS — Z96619 Presence of unspecified artificial shoulder joint: Secondary | ICD-10-CM | POA: Diagnosis not present

## 2012-08-22 DIAGNOSIS — M87029 Idiopathic aseptic necrosis of unspecified humerus: Secondary | ICD-10-CM | POA: Diagnosis not present

## 2012-08-22 DIAGNOSIS — R7309 Other abnormal glucose: Secondary | ICD-10-CM | POA: Diagnosis not present

## 2012-08-22 DIAGNOSIS — I10 Essential (primary) hypertension: Secondary | ICD-10-CM

## 2012-08-22 DIAGNOSIS — D649 Anemia, unspecified: Secondary | ICD-10-CM | POA: Diagnosis not present

## 2012-08-22 LAB — CBC WITH DIFFERENTIAL/PLATELET
Basophils Absolute: 0 10*3/uL (ref 0.0–0.1)
Basophils Relative: 0.5 % (ref 0.0–3.0)
Eosinophils Absolute: 0.1 10*3/uL (ref 0.0–0.7)
Eosinophils Relative: 2.4 % (ref 0.0–5.0)
HCT: 41.4 % (ref 36.0–46.0)
Hemoglobin: 13.6 g/dL (ref 12.0–15.0)
Lymphocytes Relative: 37.7 % (ref 12.0–46.0)
Lymphs Abs: 2.1 10*3/uL (ref 0.7–4.0)
MCHC: 32.9 g/dL (ref 30.0–36.0)
MCV: 88 fl (ref 78.0–100.0)
Monocytes Absolute: 0.3 10*3/uL (ref 0.1–1.0)
Monocytes Relative: 5.9 % (ref 3.0–12.0)
Neutro Abs: 2.9 10*3/uL (ref 1.4–7.7)
Neutrophils Relative %: 53.5 % (ref 43.0–77.0)
Platelets: 186 10*3/uL (ref 150.0–400.0)
RBC: 4.7 Mil/uL (ref 3.87–5.11)
RDW: 14.7 % — ABNORMAL HIGH (ref 11.5–14.6)
WBC: 5.4 10*3/uL (ref 4.5–10.5)

## 2012-08-22 LAB — BASIC METABOLIC PANEL
BUN: 20 mg/dL (ref 6–23)
CO2: 30 mEq/L (ref 19–32)
Calcium: 9.8 mg/dL (ref 8.4–10.5)
Chloride: 106 mEq/L (ref 96–112)
Creatinine, Ser: 1.3 mg/dL — ABNORMAL HIGH (ref 0.4–1.2)
GFR: 43 mL/min — ABNORMAL LOW (ref >60.00)
Glucose, Bld: 87 mg/dL (ref 70–99)
Potassium: 4.7 mEq/L (ref 3.5–5.1)
Sodium: 143 mEq/L (ref 135–145)

## 2012-08-22 LAB — HEMOGLOBIN A1C: Hgb A1c MFr Bld: 5.7 % (ref 4.6–6.5)

## 2012-08-23 ENCOUNTER — Other Ambulatory Visit: Payer: Medicare Other

## 2012-08-24 DIAGNOSIS — M25519 Pain in unspecified shoulder: Secondary | ICD-10-CM | POA: Diagnosis not present

## 2012-08-24 DIAGNOSIS — M6281 Muscle weakness (generalized): Secondary | ICD-10-CM | POA: Diagnosis not present

## 2012-08-24 DIAGNOSIS — Z96619 Presence of unspecified artificial shoulder joint: Secondary | ICD-10-CM | POA: Diagnosis not present

## 2012-08-24 DIAGNOSIS — M87029 Idiopathic aseptic necrosis of unspecified humerus: Secondary | ICD-10-CM | POA: Diagnosis not present

## 2012-08-27 ENCOUNTER — Ambulatory Visit: Payer: Medicare Other | Admitting: Internal Medicine

## 2012-08-28 ENCOUNTER — Encounter: Payer: Self-pay | Admitting: Internal Medicine

## 2012-08-28 ENCOUNTER — Ambulatory Visit (INDEPENDENT_AMBULATORY_CARE_PROVIDER_SITE_OTHER): Payer: Medicare Other | Admitting: Internal Medicine

## 2012-08-28 VITALS — BP 110/80 | HR 92 | Temp 97.6°F | Wt 139.0 lb

## 2012-08-28 DIAGNOSIS — G4700 Insomnia, unspecified: Secondary | ICD-10-CM | POA: Diagnosis not present

## 2012-08-28 DIAGNOSIS — I10 Essential (primary) hypertension: Secondary | ICD-10-CM

## 2012-08-28 DIAGNOSIS — M8700 Idiopathic aseptic necrosis of unspecified bone: Secondary | ICD-10-CM

## 2012-08-28 DIAGNOSIS — J069 Acute upper respiratory infection, unspecified: Secondary | ICD-10-CM | POA: Diagnosis not present

## 2012-08-28 MED ORDER — ZOLPIDEM TARTRATE 10 MG PO TABS: 10.0000 mg | ORAL_TABLET | Freq: Every evening | ORAL | Status: DC | PRN

## 2012-08-28 NOTE — Assessment & Plan Note (Signed)
Blood pressure is well controlled. Continue Cozaar 50 mg once daily. BP: 110/80 mmHg  Lab Results  Component Value Date   CREATININE 1.3* 08/22/2012

## 2012-08-28 NOTE — Progress Notes (Signed)
Subjective:    Patient ID: Sandra Craig, female    DOB: [birth year], 72 y.o.   MRN: [id number]  HPI  72 year old white female with history of hypertension, hyperlipidemia and polycystic kidney disease (chronic renal insufficiency) for routine followup. Overall patient has been doing well. She still struggling with right shoulder pain after her recent shoulder surgery. There is question of possible nerve entrapment. She has followup appointment with Dr. Dion Saucier.  Hypertension-blood pressure stable at home. She has occasional times when her systolic blood pressures in the 140s to 150s. Other times her systolic blood pressure can be 100-110 systolic.  Patient taking OxyIR occasionally for shoulder pain.  Polycystic kidney disease-she is followed by Dr. Hyman Hopes. Her serum creatinine is stable at 1.3.  Patient also complains of upper respiratory symptoms. For the last 5 days, she has experienced sneezing, sore throat and nonproductive cough. She denies fever or chills. She denies shortness of breath.   Review of Systems See HPI  Past Medical History  Diagnosis Date  . IBS (irritable bowel syndrome)   . Hypertension   . Hyperlipidemia   . IC (interstitial cystitis)   . Endometriosis   . Polycystic kidney disease   . Osteopenia   . Cystitis   . Fibromyalgia   . Palpitations   . Chronic insomnia   . Pulmonary nodule 12/08    5 mm Anterior RUL  . Hiatal hernia   . Diverticulosis of colon (without mention of hemorrhage)   . Internal hemorrhoid   . Family history of malignant neoplasm of gastrointestinal tract   . Gastritis   . Small bowel obstruction   . Osteonecrosis   . History of gallstones   . Diverticulitis of intestine without perforation or abscess without bleeding     Patient did have abscess but noperforation  . 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 Marital Status: Widowed    Spouse Name: N/A    Number of Children: N/A  .  Years of Education: N/A   Occupational History  . Retired     [employer]   Social History Main Topics  . Smoking status: Former Games developer  . Smokeless tobacco: Never Used     Comment: Quit in 1977  . Alcohol Use: No  . Drug Use: No  . Sexually Active: No   Other Topics Concern  . Not on file   Social History Narrative  . No narrative on file    Past Surgical History  Procedure Date  . Cholecystectomy   . Pelvic laparoscopy   . Abdominal hysterectomy 1994    TAH,BSO FOR ENDOMETRIOSIS  . Oophorectomy 1994    TAH,BSO  . Total hip arthroplasty FALL OF 2008    rt. partial hip replacement  . Abdominal surgery 2011    small intestine blockage  . S/p right shoulder rotater cuff 200216/2011    Tear/adhesive capsulitis  . Sbo lap 11    Adhesions and small internal hernia  . Joint replacement 2008  . Gastroplasty 2011    small bowel resection -open  . Total shoulder arthroplasty 06/05/2012    Procedure: TOTAL SHOULDER ARTHROPLASTY;  Surgeon: Eulas Post, MD;  Location: [facility] OR;  Service: Orthopedics;  Laterality: Right;  RIGHT SHOULDER TOTAL ARTHROPLASTY, HEMIARTHROPLASTY, SHOULDER, FOR ARTHRITIS  . Shoulder hemi-arthroplasty 06/05/2012    Procedure: SHOULDER HEMI-ARTHROPLASTY;  Surgeon: Eulas Post, MD;  Location: [facility] OR;  Service: Orthopedics;  Laterality: Right;  FOR  ARTHRITIS    Family History  Problem Relation Age of Onset  . Heart disease Mother     MI at age 67  . Lymphoma Maternal Grandmother   . Colon cancer Paternal Grandmother   . COPD Father   . Emphysema Mother   . Thyroid disease Mother     Thyroidectomy/Benign    Allergies  Allergen Reactions  . Celecoxib   . Hydrocodone     Itching--has taken Percocet with no problem   . Prednisone Other (See Comments)    Made pt feel "crazy"  . Sulfonamide Derivatives     Current Outpatient Prescriptions on File Prior to Visit  Medication Sig Dispense Refill  . Calcium Carbonate-Vitamin D (CALCIUM +  D PO) Take 2 tablets by mouth daily. 1200 OF CALCIUM +D      . gabapentin (NEURONTIN) 100 MG capsule Take 1 capsule (100 mg total) by mouth daily.  90 capsule  1  . losartan (COZAAR) 50 MG tablet Take 1 tablet (50 mg total) by mouth daily.  90 tablet  1  . Multiple Vitamin (MULTIVITAMIN) capsule Take 1 capsule by mouth daily.        . rosuvastatin (CRESTOR) 20 MG tablet Take 1 tablet (20 mg total) by mouth daily.  90 tablet  1  . sertraline (ZOLOFT) 100 MG tablet Take 100 mg by mouth daily.        BP 110/80  Pulse 92  Temp 97.6 F (36.4 C) (Oral)  Wt 139 lb (63.05 kg)  LMP 08/25/1992         Objective:   Physical Exam  Constitutional: She is oriented to person, place, and time. She appears well-developed and well-nourished.  HENT:  Head: Normocephalic and atraumatic.  Right Ear: External ear normal.  Left Ear: External ear normal.       Oropharyngeal erythema, postnasal drip  Neck: Neck supple.  Cardiovascular: Normal rate, regular rhythm and normal heart sounds.   No murmur heard. Pulmonary/Chest: Effort normal and breath sounds normal. She has no wheezes. She has no rales.  Lymphadenopathy:    She has no cervical adenopathy.  Neurological: She is alert and oriented to person, place, and time.  Psychiatric: She has a normal mood and affect. Her behavior is normal.          Assessment & Plan:

## 2012-08-28 NOTE — Assessment & Plan Note (Signed)
Patient experiencing pain in her right shoulder. There is question of possible nerve entrapment after her recent shoulder surgery. She has followup with her orthopedic specialist.

## 2012-08-28 NOTE — Assessment & Plan Note (Signed)
No change.  Continue ambien 10 mg as needed.

## 2012-08-28 NOTE — Assessment & Plan Note (Signed)
71 year old white female with signs and symptoms of viral URI. I recommended symptomatic treatment.  Patient advised to call office if symptoms persist or worsen.

## 2012-08-28 NOTE — Patient Instructions (Addendum)
Please complete the following lab tests before your next follow up appointment: BMET - 401.9 FLP, LFTs, TSH - 272.4 Gargle with warm salt water 2-3 times per day

## 2012-08-29 DIAGNOSIS — M6281 Muscle weakness (generalized): Secondary | ICD-10-CM | POA: Diagnosis not present

## 2012-08-29 DIAGNOSIS — M25519 Pain in unspecified shoulder: Secondary | ICD-10-CM | POA: Diagnosis not present

## 2012-08-29 DIAGNOSIS — M87029 Idiopathic aseptic necrosis of unspecified humerus: Secondary | ICD-10-CM | POA: Diagnosis not present

## 2012-08-29 DIAGNOSIS — Z96619 Presence of unspecified artificial shoulder joint: Secondary | ICD-10-CM | POA: Diagnosis not present

## 2012-08-30 ENCOUNTER — Ambulatory Visit: Payer: Medicare Other | Admitting: Internal Medicine

## 2012-08-31 DIAGNOSIS — M6281 Muscle weakness (generalized): Secondary | ICD-10-CM | POA: Diagnosis not present

## 2012-08-31 DIAGNOSIS — M25519 Pain in unspecified shoulder: Secondary | ICD-10-CM | POA: Diagnosis not present

## 2012-08-31 DIAGNOSIS — Z96619 Presence of unspecified artificial shoulder joint: Secondary | ICD-10-CM | POA: Diagnosis not present

## 2012-08-31 DIAGNOSIS — M87029 Idiopathic aseptic necrosis of unspecified humerus: Secondary | ICD-10-CM | POA: Diagnosis not present

## 2012-09-05 DIAGNOSIS — M6281 Muscle weakness (generalized): Secondary | ICD-10-CM | POA: Diagnosis not present

## 2012-09-05 DIAGNOSIS — M25519 Pain in unspecified shoulder: Secondary | ICD-10-CM | POA: Diagnosis not present

## 2012-09-05 DIAGNOSIS — M87029 Idiopathic aseptic necrosis of unspecified humerus: Secondary | ICD-10-CM | POA: Diagnosis not present

## 2012-09-05 DIAGNOSIS — Z96619 Presence of unspecified artificial shoulder joint: Secondary | ICD-10-CM | POA: Diagnosis not present

## 2012-09-07 DIAGNOSIS — M25519 Pain in unspecified shoulder: Secondary | ICD-10-CM | POA: Diagnosis not present

## 2012-09-07 DIAGNOSIS — M87029 Idiopathic aseptic necrosis of unspecified humerus: Secondary | ICD-10-CM | POA: Diagnosis not present

## 2012-09-07 DIAGNOSIS — Z96619 Presence of unspecified artificial shoulder joint: Secondary | ICD-10-CM | POA: Diagnosis not present

## 2012-09-07 DIAGNOSIS — M6281 Muscle weakness (generalized): Secondary | ICD-10-CM | POA: Diagnosis not present

## 2012-09-11 DIAGNOSIS — M25519 Pain in unspecified shoulder: Secondary | ICD-10-CM | POA: Diagnosis not present

## 2012-09-11 DIAGNOSIS — M6281 Muscle weakness (generalized): Secondary | ICD-10-CM | POA: Diagnosis not present

## 2012-09-19 DIAGNOSIS — M25519 Pain in unspecified shoulder: Secondary | ICD-10-CM | POA: Diagnosis not present

## 2012-09-19 DIAGNOSIS — M87029 Idiopathic aseptic necrosis of unspecified humerus: Secondary | ICD-10-CM | POA: Diagnosis not present

## 2012-09-19 DIAGNOSIS — Z96619 Presence of unspecified artificial shoulder joint: Secondary | ICD-10-CM | POA: Diagnosis not present

## 2012-09-19 DIAGNOSIS — M6281 Muscle weakness (generalized): Secondary | ICD-10-CM | POA: Diagnosis not present

## 2012-10-01 DIAGNOSIS — H25099 Other age-related incipient cataract, unspecified eye: Secondary | ICD-10-CM | POA: Diagnosis not present

## 2012-10-01 DIAGNOSIS — M25519 Pain in unspecified shoulder: Secondary | ICD-10-CM | POA: Diagnosis not present

## 2012-10-02 DIAGNOSIS — M25519 Pain in unspecified shoulder: Secondary | ICD-10-CM | POA: Diagnosis not present

## 2012-10-02 DIAGNOSIS — M6281 Muscle weakness (generalized): Secondary | ICD-10-CM | POA: Diagnosis not present

## 2012-10-02 DIAGNOSIS — Z96619 Presence of unspecified artificial shoulder joint: Secondary | ICD-10-CM | POA: Diagnosis not present

## 2012-10-02 DIAGNOSIS — M87029 Idiopathic aseptic necrosis of unspecified humerus: Secondary | ICD-10-CM | POA: Diagnosis not present

## 2012-10-04 DIAGNOSIS — M25519 Pain in unspecified shoulder: Secondary | ICD-10-CM | POA: Diagnosis not present

## 2012-10-04 DIAGNOSIS — Z96619 Presence of unspecified artificial shoulder joint: Secondary | ICD-10-CM | POA: Diagnosis not present

## 2012-10-04 DIAGNOSIS — M87029 Idiopathic aseptic necrosis of unspecified humerus: Secondary | ICD-10-CM | POA: Diagnosis not present

## 2012-10-04 DIAGNOSIS — M6281 Muscle weakness (generalized): Secondary | ICD-10-CM | POA: Diagnosis not present

## 2012-10-09 DIAGNOSIS — M87029 Idiopathic aseptic necrosis of unspecified humerus: Secondary | ICD-10-CM | POA: Diagnosis not present

## 2012-10-09 DIAGNOSIS — M25519 Pain in unspecified shoulder: Secondary | ICD-10-CM | POA: Diagnosis not present

## 2012-10-09 DIAGNOSIS — M6281 Muscle weakness (generalized): Secondary | ICD-10-CM | POA: Diagnosis not present

## 2012-10-09 DIAGNOSIS — Z96619 Presence of unspecified artificial shoulder joint: Secondary | ICD-10-CM | POA: Diagnosis not present

## 2012-10-11 DIAGNOSIS — M25519 Pain in unspecified shoulder: Secondary | ICD-10-CM | POA: Diagnosis not present

## 2012-10-11 DIAGNOSIS — M6281 Muscle weakness (generalized): Secondary | ICD-10-CM | POA: Diagnosis not present

## 2012-10-16 DIAGNOSIS — M25519 Pain in unspecified shoulder: Secondary | ICD-10-CM | POA: Diagnosis not present

## 2012-10-16 DIAGNOSIS — M87029 Idiopathic aseptic necrosis of unspecified humerus: Secondary | ICD-10-CM | POA: Diagnosis not present

## 2012-10-16 DIAGNOSIS — M6281 Muscle weakness (generalized): Secondary | ICD-10-CM | POA: Diagnosis not present

## 2012-10-16 DIAGNOSIS — Z96619 Presence of unspecified artificial shoulder joint: Secondary | ICD-10-CM | POA: Diagnosis not present

## 2012-10-29 DIAGNOSIS — M25519 Pain in unspecified shoulder: Secondary | ICD-10-CM | POA: Diagnosis not present

## 2012-10-30 DIAGNOSIS — M87029 Idiopathic aseptic necrosis of unspecified humerus: Secondary | ICD-10-CM | POA: Diagnosis not present

## 2012-10-30 DIAGNOSIS — M25519 Pain in unspecified shoulder: Secondary | ICD-10-CM | POA: Diagnosis not present

## 2012-10-30 DIAGNOSIS — M6281 Muscle weakness (generalized): Secondary | ICD-10-CM | POA: Diagnosis not present

## 2012-11-06 ENCOUNTER — Encounter: Payer: Self-pay | Admitting: Internal Medicine

## 2012-11-06 ENCOUNTER — Telehealth: Payer: Self-pay | Admitting: Internal Medicine

## 2012-11-06 ENCOUNTER — Ambulatory Visit (INDEPENDENT_AMBULATORY_CARE_PROVIDER_SITE_OTHER): Payer: Medicare Other | Admitting: Internal Medicine

## 2012-11-06 VITALS — BP 122/74 | Temp 98.0°F | Wt 138.0 lb

## 2012-11-06 DIAGNOSIS — R109 Unspecified abdominal pain: Secondary | ICD-10-CM

## 2012-11-06 DIAGNOSIS — E785 Hyperlipidemia, unspecified: Secondary | ICD-10-CM | POA: Diagnosis not present

## 2012-11-06 DIAGNOSIS — R1031 Right lower quadrant pain: Secondary | ICD-10-CM | POA: Insufficient documentation

## 2012-11-06 DIAGNOSIS — Q613 Polycystic kidney, unspecified: Secondary | ICD-10-CM | POA: Diagnosis not present

## 2012-11-06 LAB — POCT URINALYSIS DIPSTICK
Bilirubin, UA: NEGATIVE
Glucose, UA: NEGATIVE
Ketones, UA: NEGATIVE
Leukocytes, UA: NEGATIVE
Nitrite, UA: NEGATIVE
Protein, UA: NEGATIVE
Spec Grav, UA: <=1.005
Urobilinogen, UA: 0.2
pH, UA: 5.5

## 2012-11-06 MED ORDER — TRAMADOL HCL 50 MG PO TABS: 50.0000 mg | ORAL_TABLET | Freq: Three times a day (TID) | ORAL | Status: DC | PRN

## 2012-11-06 NOTE — Telephone Encounter (Signed)
Patient would like her tramadol sent to [facility]/ [address]. Please assist.

## 2012-11-06 NOTE — Progress Notes (Signed)
Subjective:    Patient ID: Sandra Craig, female    DOB: 23-Feb-1941, 72 y.o.   MRN: [id number]  HPI  72 year old white female with history of polycystic kidney disease, hypertension and hyperlipidemia complains of intermittent right lower quadrant pain. Her symptoms are progressively getting worse over the last one month. Patient describes right lower quadrant pain that is cramping in sensation. Occasionally her abdominal pain radiates to right side of her back. She denies fever or chills. It is not related to food intake or bowel movements. When patient lays down her symptoms seem to improve. No worsening with twisting or bending of her spine.   Review of Systems She has chronic urinary issues due to history of into cystitis, she's had previous cholecystectomy  Past Medical History  Diagnosis Date  . IBS (irritable bowel syndrome)   . Hypertension   . Hyperlipidemia   . IC (interstitial cystitis)   . Endometriosis   . Polycystic kidney disease   . Osteopenia   . Cystitis   . Fibromyalgia   . Palpitations   . Chronic insomnia   . Pulmonary nodule 12/08    5 mm Anterior RUL  . Hiatal hernia   . Diverticulosis of colon (without mention of hemorrhage)   . Internal hemorrhoid   . Family history of malignant neoplasm of gastrointestinal tract   . Gastritis   . Small bowel obstruction   . Osteonecrosis   . History of gallstones   . Diverticulitis of intestine without perforation or abscess without bleeding     Patient did have abscess but noperforation  . 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 Marital Status: Widowed    Spouse Name: N/A    Number of Children: N/A  . Years of Education: N/A   Occupational History  . Retired     [employer]   Social History Main Topics  . Smoking status: Former Games developer  . Smokeless tobacco: Never Used     Comment: Quit in 1977  . Alcohol Use: No  . Drug Use: No  . Sexually Active:  No   Other Topics Concern  . Not on file   Social History Narrative  . No narrative on file    Past Surgical History  Procedure Laterality Date  . Cholecystectomy    . Pelvic laparoscopy    . Abdominal hysterectomy  1994    TAH,BSO FOR ENDOMETRIOSIS  . Oophorectomy  1994    TAH,BSO  . Total hip arthroplasty  FALL OF 2008    rt. partial hip replacement  . Abdominal surgery  2011    small intestine blockage  . S/p right shoulder rotater cuff  200216/2011    Tear/adhesive capsulitis  . Sbo lap  11    Adhesions and small internal hernia  . Joint replacement  2008  . Gastroplasty  2011    small bowel resection -open  . Total shoulder arthroplasty  06/05/2012    Procedure: TOTAL SHOULDER ARTHROPLASTY;  Surgeon: Eulas Post, MD;  Location: [facility] OR;  Service: Orthopedics;  Laterality: Right;  RIGHT SHOULDER TOTAL ARTHROPLASTY, HEMIARTHROPLASTY, SHOULDER, FOR ARTHRITIS  . Shoulder hemi-arthroplasty  06/05/2012    Procedure: SHOULDER HEMI-ARTHROPLASTY;  Surgeon: Eulas Post, MD;  Location: [facility] OR;  Service: Orthopedics;  Laterality: Right;  FOR ARTHRITIS    Family History  Problem Relation Age of Onset  . Heart disease Mother     MI at age 28  .  Lymphoma Maternal Grandmother   . Colon cancer Paternal Grandmother   . COPD Father   . Emphysema Mother   . Thyroid disease Mother     Thyroidectomy/Benign    Allergies  Allergen Reactions  . Celecoxib   . Hydrocodone     Itching--has taken Percocet with no problem   . Prednisone Other (See Comments)    Made pt feel "crazy"  . Sulfonamide Derivatives     Current Outpatient Prescriptions on File Prior to Visit  Medication Sig Dispense Refill  . Calcium Carbonate-Vitamin D (CALCIUM + D PO) Take 2 tablets by mouth daily. 1200 OF CALCIUM +D      . gabapentin (NEURONTIN) 100 MG capsule Take 1 capsule (100 mg total) by mouth daily.  90 capsule  1  . losartan (COZAAR) 50 MG tablet Take 1 tablet (50 mg total) by mouth daily.  90  tablet  1  . Multiple Vitamin (MULTIVITAMIN) capsule Take 1 capsule by mouth daily.        . rosuvastatin (CRESTOR) 20 MG tablet Take 1 tablet (20 mg total) by mouth daily.  90 tablet  1  . sertraline (ZOLOFT) 100 MG tablet Take 100 mg by mouth daily.      Marland Kitchen zolpidem (AMBIEN) 10 MG tablet Take 1 tablet (10 mg total) by mouth at bedtime as needed. For sleep.  90 tablet  1   No current facility-administered medications on file prior to visit.    BP 122/74  Temp(Src) 98 F (36.7 C) (Oral)  Wt 138 lb (62.596 kg)  BMI 22.28 kg/m2  LMP 08/25/1992       Objective:   Physical Exam  Constitutional: She is oriented to person, place, and time. She appears well-developed and well-nourished.  HENT:  Head: Normocephalic and atraumatic.  Cardiovascular: Normal rate, regular rhythm and normal heart sounds.   No murmur heard. Pulmonary/Chest: Effort normal and breath sounds normal. She has no wheezes.  Abdominal: Soft. She exhibits no mass. There is no rebound and no guarding.  Right lower quadrant tenderness Negative for flank tenderness  Musculoskeletal: She exhibits no edema.  Neurological: She is alert and oriented to person, place, and time. No cranial nerve deficit.  Psychiatric: She has a normal mood and affect. Her behavior is normal.          Assessment & Plan:

## 2012-11-06 NOTE — Assessment & Plan Note (Signed)
72 year old white female with intermittent right lower quadrant pain. I doubt her symptoms are secondary to appendicitis. Her symptoms radiate to right side of her back. She has history of polycystic kidney disease. Her symptoms may be secondary to enlarging cyst in her right kidney. Check CBCD, BMET and sedimentation rate. Repeat renal ultrasound. Use tramadol for now. Patient advised to followup with her nephrologist - Dr. Hyman Hopes.

## 2012-11-06 NOTE — Telephone Encounter (Signed)
rx sent in electronically 

## 2012-11-06 NOTE — Patient Instructions (Signed)
Please contact our office if your symptoms do not improve or gets worse.  

## 2012-11-07 ENCOUNTER — Other Ambulatory Visit: Payer: Medicare Other

## 2012-11-07 LAB — LIPID PANEL
Cholesterol: 144 mg/dL (ref 0–200)
HDL: 60.4 mg/dL (ref >39.00)
LDL Cholesterol: 69 mg/dL (ref 0–99)
Total CHOL/HDL Ratio: 2
Triglycerides: 71 mg/dL (ref 0.0–149.0)
VLDL: 14.2 mg/dL (ref 0.0–40.0)

## 2012-11-07 LAB — TSH: TSH: 0.75 u[IU]/mL (ref 0.35–5.50)

## 2012-11-07 LAB — HEPATIC FUNCTION PANEL
ALT: 46 U/L — ABNORMAL HIGH (ref 0–35)
AST: 36 U/L (ref 0–37)
Albumin: 4 g/dL (ref 3.5–5.2)
Alkaline Phosphatase: 51 U/L (ref 39–117)
Bilirubin, Direct: 0.1 mg/dL (ref 0.0–0.3)
Total Bilirubin: 0.7 mg/dL (ref 0.3–1.2)
Total Protein: 7 g/dL (ref 6.0–8.3)

## 2012-11-07 LAB — CBC WITH DIFFERENTIAL/PLATELET
Basophils Absolute: 0 10*3/uL (ref 0.0–0.1)
Basophils Relative: 0.6 % (ref 0.0–3.0)
Eosinophils Absolute: 0.1 10*3/uL (ref 0.0–0.7)
Eosinophils Relative: 1.1 % (ref 0.0–5.0)
HCT: 38.7 % (ref 36.0–46.0)
Hemoglobin: 12.8 g/dL (ref 12.0–15.0)
Lymphocytes Relative: 34 % (ref 12.0–46.0)
Lymphs Abs: 1.9 10*3/uL (ref 0.7–4.0)
MCHC: 33.1 g/dL (ref 30.0–36.0)
MCV: 87.1 fl (ref 78.0–100.0)
Monocytes Absolute: 0.2 10*3/uL (ref 0.1–1.0)
Monocytes Relative: 4.4 % (ref 3.0–12.0)
Neutro Abs: 3.3 10*3/uL (ref 1.4–7.7)
Neutrophils Relative %: 59.9 % (ref 43.0–77.0)
Platelets: 167 10*3/uL (ref 150.0–400.0)
RBC: 4.45 Mil/uL (ref 3.87–5.11)
RDW: 15.3 % — ABNORMAL HIGH (ref 11.5–14.6)
WBC: 5.6 10*3/uL (ref 4.5–10.5)

## 2012-11-07 LAB — BASIC METABOLIC PANEL
BUN: 23 mg/dL (ref 6–23)
CO2: 28 mEq/L (ref 19–32)
Calcium: 9.6 mg/dL (ref 8.4–10.5)
Chloride: 103 mEq/L (ref 96–112)
Creatinine, Ser: 1.4 mg/dL — ABNORMAL HIGH (ref 0.4–1.2)
GFR: 41.14 mL/min — ABNORMAL LOW (ref >60.00)
Glucose, Bld: 85 mg/dL (ref 70–99)
Potassium: 4.6 mEq/L (ref 3.5–5.1)
Sodium: 140 mEq/L (ref 135–145)

## 2012-11-07 LAB — SEDIMENTATION RATE: Sed Rate: 10 mm/hr (ref 0–22)

## 2012-11-08 ENCOUNTER — Ambulatory Visit
Admission: RE | Admit: 2012-11-08 | Discharge: 2012-11-08 | Disposition: A | Discharge status: Home / Self Care | Payer: Medicare Other | Source: Ambulatory Visit | Attending: Internal Medicine | Admitting: Internal Medicine

## 2012-11-08 DIAGNOSIS — R109 Unspecified abdominal pain: Secondary | ICD-10-CM

## 2012-11-08 DIAGNOSIS — N281 Cyst of kidney, acquired: Secondary | ICD-10-CM | POA: Diagnosis not present

## 2012-11-08 DIAGNOSIS — Q613 Polycystic kidney, unspecified: Secondary | ICD-10-CM

## 2012-11-09 DIAGNOSIS — M25519 Pain in unspecified shoulder: Secondary | ICD-10-CM | POA: Diagnosis not present

## 2012-11-09 DIAGNOSIS — M87029 Idiopathic aseptic necrosis of unspecified humerus: Secondary | ICD-10-CM | POA: Diagnosis not present

## 2012-11-09 DIAGNOSIS — M6281 Muscle weakness (generalized): Secondary | ICD-10-CM | POA: Diagnosis not present

## 2012-11-21 DIAGNOSIS — M25519 Pain in unspecified shoulder: Secondary | ICD-10-CM | POA: Diagnosis not present

## 2012-11-21 DIAGNOSIS — Z96619 Presence of unspecified artificial shoulder joint: Secondary | ICD-10-CM | POA: Diagnosis not present

## 2012-11-21 DIAGNOSIS — M87029 Idiopathic aseptic necrosis of unspecified humerus: Secondary | ICD-10-CM | POA: Diagnosis not present

## 2012-11-21 DIAGNOSIS — M6281 Muscle weakness (generalized): Secondary | ICD-10-CM | POA: Diagnosis not present

## 2012-12-12 DIAGNOSIS — M6281 Muscle weakness (generalized): Secondary | ICD-10-CM | POA: Diagnosis not present

## 2012-12-12 DIAGNOSIS — M87029 Idiopathic aseptic necrosis of unspecified humerus: Secondary | ICD-10-CM | POA: Diagnosis not present

## 2013-01-04 ENCOUNTER — Other Ambulatory Visit: Payer: Self-pay | Admitting: *Deleted

## 2013-01-04 MED ORDER — ROSUVASTATIN CALCIUM 20 MG PO TABS: 20.0000 mg | ORAL_TABLET | Freq: Every day | ORAL | Status: DC

## 2013-01-04 MED ORDER — GABAPENTIN 100 MG PO CAPS: 100.0000 mg | ORAL_CAPSULE | Freq: Every day | ORAL | Status: DC

## 2013-01-04 MED ORDER — LOSARTAN POTASSIUM 50 MG PO TABS: 50.0000 mg | ORAL_TABLET | Freq: Every day | ORAL | Status: DC

## 2013-03-11 ENCOUNTER — Other Ambulatory Visit: Payer: Self-pay | Admitting: *Deleted

## 2013-03-11 MED ORDER — ZOLPIDEM TARTRATE 10 MG PO TABS: 10.0000 mg | ORAL_TABLET | Freq: Every evening | ORAL | Status: DC | PRN

## 2013-04-02 ENCOUNTER — Ambulatory Visit: Payer: Medicare Other | Admitting: Internal Medicine

## 2013-05-08 ENCOUNTER — Ambulatory Visit: Payer: Medicare Other

## 2013-05-09 ENCOUNTER — Other Ambulatory Visit: Payer: Self-pay | Admitting: Dermatology

## 2013-05-09 DIAGNOSIS — D0460 Carcinoma in situ of skin of unspecified upper limb, including shoulder: Secondary | ICD-10-CM | POA: Diagnosis not present

## 2013-05-09 DIAGNOSIS — L820 Inflamed seborrheic keratosis: Secondary | ICD-10-CM | POA: Diagnosis not present

## 2013-05-09 DIAGNOSIS — L821 Other seborrheic keratosis: Secondary | ICD-10-CM | POA: Diagnosis not present

## 2013-05-09 DIAGNOSIS — L739 Follicular disorder, unspecified: Secondary | ICD-10-CM | POA: Diagnosis not present

## 2013-05-09 DIAGNOSIS — D1801 Hemangioma of skin and subcutaneous tissue: Secondary | ICD-10-CM | POA: Diagnosis not present

## 2013-05-10 ENCOUNTER — Other Ambulatory Visit: Payer: Self-pay | Admitting: *Deleted

## 2013-05-10 MED ORDER — HYDROCODONE-ACETAMINOPHEN 5-325 MG PO TABS: 1.0000 | ORAL_TABLET | Freq: Four times a day (QID) | ORAL | Status: DC | PRN

## 2013-05-10 NOTE — Telephone Encounter (Signed)
Patient was a walk-in.  Patient states that she "just had a root canal.  Orthodontist suggested to call physician for pain.  She can not take Advil or tyenol due to possible liver issues".  Hydrocodone per Dr Kirtland Bouchard.

## 2013-05-16 DIAGNOSIS — Z1231 Encounter for screening mammogram for malignant neoplasm of breast: Secondary | ICD-10-CM | POA: Diagnosis not present

## 2013-05-17 ENCOUNTER — Encounter: Payer: Self-pay | Admitting: Women's Health

## 2013-05-19 DIAGNOSIS — Z23 Encounter for immunization: Secondary | ICD-10-CM | POA: Diagnosis not present

## 2013-05-20 ENCOUNTER — Telehealth: Payer: Self-pay | Admitting: *Deleted

## 2013-05-20 NOTE — Telephone Encounter (Signed)
Pt requested her next prolia to be at the time of her annual. Benefits checked with primary and secondary insurance, her secondary picks up what her primary doesn't cover and there is no prior auth needed. AEX 10/24 with [employer]

## 2013-05-24 ENCOUNTER — Ambulatory Visit (INDEPENDENT_AMBULATORY_CARE_PROVIDER_SITE_OTHER): Payer: Medicare Other | Admitting: Women's Health

## 2013-05-24 ENCOUNTER — Encounter: Payer: Self-pay | Admitting: Women's Health

## 2013-05-24 VITALS — BP 114/70 | Ht 65.75 in | Wt 139.0 lb

## 2013-05-24 DIAGNOSIS — M810 Age-related osteoporosis without current pathological fracture: Secondary | ICD-10-CM

## 2013-05-24 DIAGNOSIS — F3289 Other specified depressive episodes: Secondary | ICD-10-CM

## 2013-05-24 DIAGNOSIS — F32A Depression, unspecified: Secondary | ICD-10-CM

## 2013-05-24 DIAGNOSIS — F329 Major depressive disorder, single episode, unspecified: Secondary | ICD-10-CM | POA: Diagnosis not present

## 2013-05-24 DIAGNOSIS — N952 Postmenopausal atrophic vaginitis: Secondary | ICD-10-CM | POA: Diagnosis not present

## 2013-05-24 MED ORDER — ESTRADIOL 10 MCG VA TABS: 1.0000 | ORAL_TABLET | VAGINAL | Status: DC

## 2013-05-24 MED ORDER — SERTRALINE HCL 100 MG PO TABS: 100.0000 mg | ORAL_TABLET | Freq: Every day | ORAL | Status: DC

## 2013-05-24 MED ORDER — DENOSUMAB 60 MG/ML ~~LOC~~ SOLN
60.0000 mg | Freq: Once | SUBCUTANEOUS | Status: AC
  Administered 2013-05-24: 60 mg via SUBCUTANEOUS

## 2013-05-24 NOTE — Progress Notes (Signed)
Sandra Craig May 26, 1941 [id number]    History:    The patient presents for breast and pelvic exam. TAH with BSO in 1994 for endometriosis. DEXA 1/20132 T score AP spine -2, left femoral neck -1.8 on Prolia. Reclast [id number], Prolia since 2013. History of right hip fracture and right shoulder fracture tramatic fall. Normal Pap and mammogram history. Colonoscopy 2013,  diverticulosis with 1 benign polyp.  Squamous cell carcinoma left elbow 2014. IBS/hypertension/IC/hypercholesterolemia primary care manages. Had been on Zoloft 100 and weaned off but states felt better when she was on and would like to resume.  Past medical history, past surgical history, family history and social history were all reviewed and documented in the EPIC chart. Small bowel obstruction 2012.   Exam:  Filed Vitals:   05/24/13 1111  BP: 114/70    General appearance:  Normal Head/Neck:  Normal, without cervical or supraclavicular adenopathy. Thyroid:  Symmetrical, normal in size, without palpable masses or nodularity. Respiratory  Effort:  Normal  Auscultation:  Clear without wheezing or rhonchi Cardiovascular  Auscultation:  Regular rate, without rubs, murmurs or gallops  Edema/varicosities:  Not grossly evident Abdominal  Soft,nontender, without masses, guarding or rebound.  Liver/spleen:  No organomegaly noted  Hernia:  None appreciated  Skin  Inspection:  Grossly normal  Palpation:  Grossly normal Neurologic/psychiatric  Orientation:  Normal with appropriate conversation.  Mood/affect:  Normal  Genitourinary    Breasts: Examined lying and sitting.     Right: Without masses, retractions, discharge or axillary adenopathy.     Left: Without masses, retractions, discharge or axillary adenopathy.   Inguinal/mons:  Normal without inguinal adenopathy  External genitalia:  Normal  BUS/Urethra/Skene's glands:  Normal  Bladder:  Normal  Vagina:  Atrophic vaginitis Cervix:  Absent  Uterus:   Absent Adnexa/parametria:     Rt: Without masses or tenderness.   Lt: Without masses or tenderness.  Anus and perineum: Normal  Digital rectal exam: Normal sphincter tone without palpated masses or tenderness  Assessment/Plan:  72 y.o.WWF G1P0  for breast and pelvic exam with complaint of vaginal dryness and requesting to start back on Zoloft.  Atrophic vaginitis Depression Osteoporosis on Prolia Hypertension/IBS/IC/hypercholesterolemia-primary care manages labs and meds  Plan: Prolia IM every 6 months. Received dose today. SBE's, continue annual mammogram, calcium rich diet, vitamin D 2000 daily encouraged. Zoloft 100 we'll start with half dose daily, prescription, proper use given and reviewed. Denies need for counseling, reports always low-grade depression. Structure to call if no relief of symptoms. Reviewed importance of home safety, fall prevention and need for regular exercise. Vaginal dryness/dyspareunia reviewed, Vagifem one applicator at bedtime x2 weeks and then twice weekly thereafter, prescription, sample, risk for blood clots, strokes, breast cancer reviewed. Instructed to call if no relief, continue lubricant.   Harrington Challenger [facility], 12:58 PM 05/24/2013

## 2013-05-24 NOTE — Patient Instructions (Signed)
Health Recommendations for Postmenopausal Women Respected and ongoing research has looked at the most common causes of death, disability, and poor quality of life in postmenopausal women. The causes include heart disease, diseases of blood vessels, diabetes, depression, cancer, and bone loss (osteoporosis). Many things can be done to help lower the chances of developing these and other common problems: CARDIOVASCULAR DISEASE Heart Disease: A heart attack is a medical emergency. Know the signs and symptoms of a heart attack. Below are things women can do to reduce their risk for heart disease.   Do not smoke. If you smoke, quit.  Aim for a healthy weight. Being overweight causes many preventable deaths. Eat a healthy and balanced diet and drink an adequate amount of liquids.  Get moving. Make a commitment to be more physically active. Aim for 30 minutes of activity on most, if not all days of the week.  Eat for heart health. Choose a diet that is low in saturated fat and cholesterol and eliminate trans fat. Include whole grains, vegetables, and fruits. Read and understand the labels on food containers before buying.  Know your numbers. Ask your caregiver to check your blood pressure, cholesterol (total, HDL, LDL, triglycerides) and blood glucose. Work with your caregiver on improving your entire clinical picture.  High blood pressure. Limit or stop your table salt intake (try salt substitute and food seasonings). Avoid salty foods and drinks. Read labels on food containers before buying. Eating well and exercising can help control high blood pressure. STROKE  Stroke is a medical emergency. Stroke may be the result of a blood clot in a blood vessel in the brain or by a brain hemorrhage (bleeding). Know the signs and symptoms of a stroke. To lower the risk of developing a stroke:  Avoid fatty foods.  Quit smoking.  Control your diabetes, blood pressure, and irregular heart rate. THROMBOPHLEBITIS  (BLOOD CLOT) OF THE LEG  Becoming overweight and leading a stationary lifestyle may also contribute to developing blood clots. Controlling your diet and exercising will help lower the risk of developing blood clots. CANCER SCREENING  Breast Cancer: Take steps to reduce your risk of breast cancer.  You should practice "breast self-awareness." This means understanding the normal appearance and feel of your breasts and should include breast self-examination. Any changes detected, no matter how small, should be reported to your caregiver.  After age 40, you should have a clinical breast exam (CBE) every year.  Starting at age 40, you should consider having a mammogram (breast X-ray) every year.  If you have a family history of breast cancer, talk to your caregiver about genetic screening.  If you are at high risk for breast cancer, talk to your caregiver about having an MRI and a mammogram every year.  Intestinal or Stomach Cancer: Tests to consider are a rectal exam, fecal occult blood, sigmoidoscopy, and colonoscopy. Women who are high risk may need to be screened at an earlier age and more often.  Cervical Cancer:  Beginning at age 30, you should have a Pap test every 3 years as long as the past 3 Pap tests have been normal.  If you have had past treatment for cervical cancer or a condition that could lead to cancer, you need Pap tests and screening for cancer for at least 20 years after your treatment.  If you had a hysterectomy for a problem that was not cancer or a condition that could lead to cancer, then you no longer need Pap tests.    If you are between ages 65 and 70, and you have had normal Pap tests going back 10 years, you no longer need Pap tests.  If Pap tests have been discontinued, risk factors (such as a new sexual partner) need to be reassessed to determine if screening should be resumed.  Some medical problems can increase the chance of getting cervical cancer. In these  cases, your caregiver may recommend more frequent screening and Pap tests.  Uterine Cancer: If you have vaginal bleeding after reaching menopause, you should notify your caregiver.  Ovarian cancer: Other than yearly pelvic exams, there are no reliable tests available to screen for ovarian cancer at this time except for yearly pelvic exams.  Lung Cancer: Yearly chest X-rays can detect lung cancer and should be done on high risk women, such as cigarette smokers and women with chronic lung disease (emphysema).  Skin Cancer: A complete body skin exam should be done at your yearly examination. Avoid overexposure to the sun and ultraviolet light lamps. Use a strong sun block cream when in the sun. All of these things are important in lowering the risk of skin cancer. MENOPAUSE Menopause Symptoms: Hormone therapy products are effective for treating symptoms associated with menopause:  Moderate to severe hot flashes.  Night sweats.  Mood swings.  Headaches.  Tiredness.  Loss of sex drive.  Insomnia.  Other symptoms. Hormone replacement carries certain risks, especially in older women. Women who use or are thinking about using estrogen or estrogen with progestin treatments should discuss that with their caregiver. Your caregiver will help you understand the benefits and risks. The ideal dose of hormone replacement therapy is not known. The Food and Drug Administration (FDA) has concluded that hormone therapy should be used only at the lowest doses and for the shortest amount of time to reach treatment goals.  OSTEOPOROSIS Protecting Against Bone Loss and Preventing Fracture: If you use hormone therapy for prevention of bone loss (osteoporosis), the risks for bone loss must outweigh the risk of the therapy. Ask your caregiver about other medications known to be safe and effective for preventing bone loss and fractures. To guard against bone loss or fractures, the following is recommended:  If  you are less than age 50, take 1000 mg of calcium and at least 600 mg of Vitamin D per day.  If you are greater than age 50 but less than age 70, take 1200 mg of calcium and at least 600 mg of Vitamin D per day.  If you are greater than age 70, take 1200 mg of calcium and at least 800 mg of Vitamin D per day. Smoking and excessive alcohol intake increases the risk of osteoporosis. Eat foods rich in calcium and vitamin D and do weight bearing exercises several times a week as your caregiver suggests. DIABETES Diabetes Melitus: If you have Type I or Type 2 diabetes, you should keep your blood sugar under control with diet, exercise and recommended medication. Avoid too many sweets, starchy and fatty foods. Being overweight can make control more difficult. COGNITION AND MEMORY Cognition and Memory: Menopausal hormone therapy is not recommended for the prevention of cognitive disorders such as Alzheimer's disease or memory loss.  DEPRESSION  Depression may occur at any age, but is common in elderly women. The reasons may be because of physical, medical, social (loneliness), or financial problems and needs. If you are experiencing depression because of medical problems and control of symptoms, talk to your caregiver about this. Physical activity and   exercise may help with mood and sleep. Community and volunteer involvement may help your sense of value and worth. If you have depression and you feel that the problem is getting worse or becoming severe, talk to your caregiver about treatment options that are best for you. ACCIDENTS  Accidents are common and can be serious in the elderly woman. Prepare your house to prevent accidents. Eliminate throw rugs, place hand bars in the bath, shower and toilet areas. Avoid wearing high heeled shoes or walking on wet, snowy, and icy areas. Limit or stop driving if you have vision or hearing problems, or you feel you are unsteady with you movements and  reflexes. HEPATITIS C Hepatitis C is a type of viral infection affecting the liver. It is spread mainly through contact with blood from an infected person. It can be treated, but if left untreated, it can lead to severe liver damage over years. Many people who are infected do not know that the virus is in their blood. If you are a "baby-boomer", it is recommended that you have one screening test for Hepatitis C. IMMUNIZATIONS  Several immunizations are important to consider having during your senior years, including:   Tetanus, diptheria, and pertussis booster shot.  Influenza every year before the flu season begins.  Pneumonia vaccine.  Shingles vaccine.  Others as indicated based on your specific needs. Talk to your caregiver about these. Document Released: 09/09/2005 Document Revised: 07/04/2012 Document Reviewed: 05/05/2008 [employer] Patient Information 2014 [employer].  

## 2013-06-24 DIAGNOSIS — N2581 Secondary hyperparathyroidism of renal origin: Secondary | ICD-10-CM | POA: Diagnosis not present

## 2013-06-24 DIAGNOSIS — D631 Anemia in chronic kidney disease: Secondary | ICD-10-CM | POA: Diagnosis not present

## 2013-07-29 ENCOUNTER — Telehealth: Payer: Self-pay

## 2013-07-29 NOTE — Telephone Encounter (Signed)
Pt states that she spoke with [facility] on Saturday concerning deep, wet cough, however there is not phone note.  Pt c/o deep, wet cough x 4 days. Pt states that she is able to make cough productive sometimes and it is thick, green mucus.  Appointment scheduled

## 2013-07-30 ENCOUNTER — Ambulatory Visit (INDEPENDENT_AMBULATORY_CARE_PROVIDER_SITE_OTHER): Payer: Medicare Other | Admitting: Family Medicine

## 2013-07-30 ENCOUNTER — Encounter: Payer: Self-pay | Admitting: Family Medicine

## 2013-07-30 VITALS — BP 102/68 | HR 84 | Temp 98.0°F | Wt 132.0 lb

## 2013-07-30 DIAGNOSIS — J111 Influenza due to unidentified influenza virus with other respiratory manifestations: Secondary | ICD-10-CM | POA: Diagnosis not present

## 2013-07-30 MED ORDER — AZITHROMYCIN 250 MG PO TABS: ORAL_TABLET | ORAL | Status: DC

## 2013-07-30 NOTE — Progress Notes (Signed)
Pre visit review using our clinic review tool, if applicable. No additional management support is needed unless otherwise documented below in the visit note. 

## 2013-07-30 NOTE — Progress Notes (Signed)
Chief Complaint  Patient presents with  . Cough    congestion, SOB, fever, chills, started on Thursday     HPI:  -started: 5 days ago -symptoms:nasal congestion, sore throat, cough, mild SOB with coughing, chills, fever of 102 which resolved 2 days ago - since has had worsening cough and mild SOB, body aches -denies: NVD, tooth pain, hemoptesis -has tried: nothing -sick contacts/travel/risks: denies flu exposure or Ebola risks  ROS: See pertinent positives and negatives per HPI.  Past Medical History  Diagnosis Date  . IBS (irritable bowel syndrome)   . Hypertension   . Hyperlipidemia   . IC (interstitial cystitis)   . Endometriosis   . Polycystic kidney disease   . Osteopenia   . Cystitis   . Fibromyalgia   . Palpitations   . Chronic insomnia   . Pulmonary nodule 12/08    5 mm Anterior RUL  . Hiatal hernia   . Diverticulosis of colon (without mention of hemorrhage)   . Internal hemorrhoid   . Family history of malignant neoplasm of gastrointestinal tract   . Gastritis   . Small bowel obstruction   . Osteonecrosis   . History of gallstones   . Diverticulitis of intestine without perforation or abscess without bleeding     Patient did have abscess but noperforation  . 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 Osteoporosis     Past Surgical History  Procedure Laterality Date  . Cholecystectomy    . Pelvic laparoscopy    . Abdominal hysterectomy  1994    TAH,BSO FOR ENDOMETRIOSIS  . Oophorectomy  1994    TAH,BSO  . Total hip arthroplasty  FALL OF 2008    rt. partial hip replacement  . Abdominal surgery  2011    small intestine blockage  . S/p right shoulder rotater cuff  200216/2011    Tear/adhesive capsulitis  . Sbo lap  11    Adhesions and small internal hernia  . Joint replacement  2008  . Gastroplasty  2011    small bowel resection -open  . Total shoulder arthroplasty  06/05/2012    Procedure: TOTAL SHOULDER ARTHROPLASTY;  Surgeon: Eulas Post, MD;  Location: [facility] OR;  Service: Orthopedics;  Laterality: Right;  RIGHT SHOULDER TOTAL ARTHROPLASTY, HEMIARTHROPLASTY, SHOULDER, FOR ARTHRITIS  . Shoulder hemi-arthroplasty  06/05/2012    Procedure: SHOULDER HEMI-ARTHROPLASTY;  Surgeon: Eulas Post, MD;  Location: [facility];  Service: Orthopedics;  Laterality: Right;  FOR ARTHRITIS    Family History  Problem Relation Age of Onset  . Heart disease Mother     MI at age 48  . Lymphoma Maternal Grandmother   . Colon cancer Paternal Grandmother   . COPD Father   . Emphysema Mother   . Thyroid disease Mother     Thyroidectomy/Benign    History   Social History  . Marital Status: Widowed    Spouse Name: N/A    Number of Children: N/A  . Years of Education: N/A   Occupational History  . Retired     [employer]   Social History Main Topics  . Smoking status: Former Games developer  . Smokeless tobacco: Never Used     Comment: Quit in 1977  . Alcohol Use: No  . Drug Use: No  . Sexual Activity: No   Other Topics Concern  . None   Social History Narrative  . None    Current outpatient prescriptions:Calcium Carbonate-Vitamin D (CALCIUM + D PO), Take 2  tablets by mouth daily. 1200 OF CALCIUM +D, Disp: , Rfl: ;  co-enzyme Q-10 30 MG capsule, Take 30 mg by mouth 3 (three) times daily., Disp: , Rfl: ;  Estradiol 10 MCG TABS vaginal tablet, Place 1 tablet (10 mcg total) vaginally 2 (two) times a week., Disp: 8 tablet, Rfl: 11 gabapentin (NEURONTIN) 100 MG capsule, Take 1 capsule (100 mg total) by mouth daily., Disp: 90 capsule, Rfl: 1;  losartan (COZAAR) 50 MG tablet, Take 1 tablet (50 mg total) by mouth daily., Disp: 90 tablet, Rfl: 1;  Multiple Vitamin (MULTIVITAMIN) capsule, Take 1 capsule by mouth daily.  , Disp: , Rfl: ;  rosuvastatin (CRESTOR) 20 MG tablet, Take 1 tablet (20 mg total) by mouth daily., Disp: 90 tablet, Rfl: 1 sertraline (ZOLOFT) 100 MG tablet, Take 1 tablet (100 mg total) by mouth daily., Disp: 90 tablet, Rfl:  4;  zolpidem (AMBIEN) 10 MG tablet, Take 1 tablet (10 mg total) by mouth at bedtime as needed. For sleep., Disp: 90 tablet, Rfl: 1;  azithromycin (ZITHROMAX) 250 MG tablet, 2 tabs on first day then 1 tab daily for 4 days, Disp: 6 tablet, Rfl: 0  EXAM:  Filed Vitals:   07/30/13 1556  BP: 102/68  Pulse: 84  Temp: 98 F (36.7 C)    Body mass index is 21.47 kg/(m^2).  GENERAL: vitals reviewed and listed above, alert, oriented, appears well hydrated and in no acute distress  HEENT: atraumatic, conjunttiva clear, no obvious abnormalities on inspection of external nose and ears, normal appearance of ear canals and TMs, clear nasal congestion, mild post oropharyngeal erythema with PND, no tonsillar edema or exudate, no sinus TTP  NECK: no obvious masses on inspection  LUNGS: diffuse wheezing, good air movement  CV: HRRR, no peripheral edema  MS: moves all extremities without noticeable abnormality  PSYCH: pleasant and cooperative, no obvious depression or anxiety  ASSESSMENT AND PLAN:  Discussed the following assessment and plan:  Influenza with respiratory manifestations - Plan: azithromycin (ZITHROMAX) 250 MG tablet  -given HPI and exam findings today influenza is likely with possible developing mild pneumonia. We discussed potential etiologies, treatment side effects, likely course, antibiotic use/misuse, transmission, and signs of developing a serious illness. -she is out of treatment window from likely benefit from tamiflu and fever is resolved, however given lung exam and SOB will tx with macrolide in case developing pna -of course, we advised to return or notify a doctor immediately if symptoms worsen or persist or new concerns arise.    Patient Instructions  Influenza, Adult Influenza ("the flu") is a viral infection of the respiratory tract. It occurs more often in winter months because people spend more time in close contact with one another. Influenza can make you feel  very sick. Influenza easily spreads from person to person (contagious). CAUSES  Influenza is caused by a virus that infects the respiratory tract. You can catch the virus by breathing in droplets from an infected person's cough or sneeze. You can also catch the virus by touching something that was recently contaminated with the virus and then touching your mouth, nose, or eyes. SYMPTOMS  Symptoms typically last 4 to 10 days and may include:  Fever.  Chills.  Headache, body aches, and muscle aches.  Sore throat.  Chest discomfort and cough.  Poor appetite.  Weakness or feeling tired.  Dizziness.  Nausea or vomiting. DIAGNOSIS  Diagnosis of influenza is often made based on your history and a physical exam. A nose or throat  swab test can be done to confirm the diagnosis. RISKS AND COMPLICATIONS You may be at risk for a more severe case of influenza if you smoke cigarettes, have diabetes, have chronic heart disease (such as heart failure) or lung disease (such as asthma), or if you have a weakened immune system. Elderly people and pregnant women are also at risk for more serious infections. The most common complication of influenza is a lung infection (pneumonia). Sometimes, this complication can require emergency medical care and may be life-threatening. PREVENTION  An annual influenza vaccination (flu shot) is the best way to avoid getting influenza. An annual flu shot is now routinely recommended for all adults in the [place] TREATMENT  In mild cases, influenza goes away on its own. Treatment is directed at relieving symptoms. For more severe cases, your caregiver may prescribe antiviral medicines to shorten the sickness. Antibiotic medicines are not effective, because the infection is caused by a virus, not by bacteria. HOME CARE INSTRUCTIONS  Only take over-the-counter or prescription medicines for pain, discomfort, or fever as directed by your caregiver.  Use a cool mist humidifier  to make breathing easier.  Get plenty of rest until your temperature returns to normal. This usually takes 3 to 4 days.  Drink enough fluids to keep your urine clear or pale yellow.  Cover your mouth and nose when coughing or sneezing, and wash your hands well to avoid spreading the virus.  Stay home from work or school until your fever has been gone for at least 1 full day. SEEK MEDICAL CARE IF:   You have chest pain or a deep cough that worsens or produces more mucus.  You have nausea, vomiting, or diarrhea. SEEK IMMEDIATE MEDICAL CARE IF:   You have difficulty breathing, shortness of breath, or your skin or nails turn bluish.  You have severe neck pain or stiffness.  You have a severe headache, facial pain, or earache.  You have a worsening or recurring fever.  You have nausea or vomiting that cannot be controlled. MAKE SURE YOU:  Understand these instructions.  Will watch your condition.  Will get help right away if you are not doing well or get worse. Document Released: 07/15/2000 Document Revised: 01/17/2012 Document Reviewed: 10/17/2011 [facility] Patient Information 2014 [place], Lona Kettle, Dahlia Client R.

## 2013-07-30 NOTE — Patient Instructions (Signed)

## 2013-08-09 ENCOUNTER — Encounter: Payer: Self-pay | Admitting: Internal Medicine

## 2013-08-09 ENCOUNTER — Ambulatory Visit (INDEPENDENT_AMBULATORY_CARE_PROVIDER_SITE_OTHER): Payer: Medicare Other | Admitting: Internal Medicine

## 2013-08-09 VITALS — BP 124/80 | Temp 98.0°F | Ht 65.75 in | Wt 134.0 lb

## 2013-08-09 DIAGNOSIS — R1011 Right upper quadrant pain: Secondary | ICD-10-CM | POA: Diagnosis not present

## 2013-08-09 DIAGNOSIS — J40 Bronchitis, not specified as acute or chronic: Secondary | ICD-10-CM

## 2013-08-09 DIAGNOSIS — D696 Thrombocytopenia, unspecified: Secondary | ICD-10-CM | POA: Diagnosis not present

## 2013-08-09 DIAGNOSIS — I10 Essential (primary) hypertension: Secondary | ICD-10-CM

## 2013-08-09 DIAGNOSIS — D649 Anemia, unspecified: Secondary | ICD-10-CM | POA: Diagnosis not present

## 2013-08-09 LAB — CBC WITH DIFFERENTIAL/PLATELET
Basophils Absolute: 0 10*3/uL (ref 0.0–0.1)
Basophils Relative: 0 % (ref 0–1)
Eosinophils Absolute: 0.1 10*3/uL (ref 0.0–0.7)
Eosinophils Relative: 1 % (ref 0–5)
HCT: 38.3 % (ref 36.0–46.0)
Hemoglobin: 12.5 g/dL (ref 12.0–15.0)
Lymphocytes Relative: 26 % (ref 12–46)
Lymphs Abs: 1.5 10*3/uL (ref 0.7–4.0)
MCH: 28.6 pg (ref 26.0–34.0)
MCHC: 32.6 g/dL (ref 30.0–36.0)
MCV: 87.6 fL (ref 78.0–100.0)
Monocytes Absolute: 0.4 10*3/uL (ref 0.1–1.0)
Monocytes Relative: 7 % (ref 3–12)
Neutro Abs: 3.8 10*3/uL (ref 1.7–7.7)
Neutrophils Relative %: 66 % (ref 43–77)
Platelets: 229 10*3/uL (ref 150–400)
RBC: 4.37 MIL/uL (ref 3.87–5.11)
RDW: 14.3 % (ref 11.5–15.5)
WBC: 5.9 10*3/uL (ref 4.0–10.5)

## 2013-08-09 LAB — HEPATIC FUNCTION PANEL
ALT: 16 U/L (ref 0–35)
AST: 21 U/L (ref 0–37)
Albumin: 4 g/dL (ref 3.5–5.2)
Alkaline Phosphatase: 59 U/L (ref 39–117)
Bilirubin, Direct: 0.1 mg/dL (ref 0.0–0.3)
Indirect Bilirubin: 0.3 mg/dL (ref 0.0–0.9)
Total Bilirubin: 0.4 mg/dL (ref 0.3–1.2)
Total Protein: 6.4 g/dL (ref 6.0–8.3)

## 2013-08-09 LAB — BASIC METABOLIC PANEL
BUN: 20 mg/dL (ref 6–23)
CO2: 31 mEq/L (ref 19–32)
Calcium: 9.4 mg/dL (ref 8.4–10.5)
Chloride: 108 mEq/L (ref 96–112)
Creat: 1.54 mg/dL — ABNORMAL HIGH (ref 0.50–1.10)
Glucose, Bld: 89 mg/dL (ref 70–99)
Potassium: 4.8 mEq/L (ref 3.5–5.3)
Sodium: 143 mEq/L (ref 135–145)

## 2013-08-09 MED ORDER — LOSARTAN POTASSIUM 50 MG PO TABS: 50.0000 mg | ORAL_TABLET | Freq: Every day | ORAL | Status: DC

## 2013-08-09 MED ORDER — BUDESONIDE-FORMOTEROL FUMARATE 160-4.5 MCG/ACT IN AERO: 2.0000 | INHALATION_SPRAY | Freq: Two times a day (BID) | RESPIRATORY_TRACT | Status: DC

## 2013-08-09 MED ORDER — ZOLPIDEM TARTRATE 10 MG PO TABS: 10.0000 mg | ORAL_TABLET | Freq: Every evening | ORAL | Status: DC | PRN

## 2013-08-09 MED ORDER — ROSUVASTATIN CALCIUM 20 MG PO TABS: 20.0000 mg | ORAL_TABLET | Freq: Every day | ORAL | Status: DC

## 2013-08-09 MED ORDER — GABAPENTIN 100 MG PO CAPS: 100.0000 mg | ORAL_CAPSULE | Freq: Every day | ORAL | Status: DC

## 2013-08-09 MED ORDER — DOXYCYCLINE HYCLATE 100 MG PO TABS: 100.0000 mg | ORAL_TABLET | Freq: Two times a day (BID) | ORAL | Status: DC

## 2013-08-09 NOTE — Assessment & Plan Note (Signed)
Stable.  No change in medication.  Monitor electrolytes and kidney function.

## 2013-08-09 NOTE — Assessment & Plan Note (Signed)
73 year old white female reports suffering from possible influenza a week before Christmas in 2014. Patient was treated with azithromycin for 5 days. She has persistent cough. On exam she has scattered wheezing. Obtain chest x-ray to rule out pneumonia. Treat with doxycycline 100 mg twice daily for 10 days. Use Symbicort 160/4.5-2 puffs twice daily. Reassess in 2 weeks. Patient advised to call office if symptoms persist or worsen.

## 2013-08-09 NOTE — Assessment & Plan Note (Signed)
Patient has intermittent persistent unexplained right upper quadrant abdominal pain. She has history of polycystic kidney disease and status post cholecystectomy. Patient is not having any associated gastrointestinal symptoms. Obtain CBC with differential, basic metabolic panel and LFTs. If unremarkable continue to monitor for now. She has had normal colonoscopy and endoscopy in 2013. If persistent symptoms, consider repeat CT of abdomen and pelvis vs abdominal ultrasound

## 2013-08-09 NOTE — Progress Notes (Signed)
Subjective:    Patient ID: Sandra Craig, female    DOB: 1941-01-04, 73 y.o.   MRN: [id number]  HPI  73 year old white female with history of hypertension, hyperlipidemia and polycystic kidney disease for followup. Patient was seen in late December of 2014 secondary to possible bronchitis. Patient reports she thinks she had the flu and then later developed secondary bronchitis. She was prescribed azithromycin for 5 days. Patient reports minimal improvement in cough. Her cough is nonproductive. She denies any persistent fever. She denies any shortness of breath.  Patient seen in April of 2014 for right upper quadrant/right-sided flank pain. Renal ultrasound showed bilateral tiny renal cysts. She denies any associated gastrointestinal symptoms such as nausea, vomiting, constipation or diarrhea.  Patient completed endoscopy and colonoscopy in September of 2013.  (Dr. Olevia Perches)   Review of Systems Negative for fever chills, nonproductive cough, no gastrointestinal symptoms    Past Medical History  Diagnosis Date  . IBS (irritable bowel syndrome)   . Hypertension   . Hyperlipidemia   . IC (interstitial cystitis)   . Endometriosis   . Polycystic kidney disease   . Osteopenia   . Cystitis   . Fibromyalgia   . Palpitations   . Chronic insomnia   . Pulmonary nodule 12/08    5 mm Anterior RUL  . Hiatal hernia   . Diverticulosis of colon (without mention of hemorrhage)   . Internal hemorrhoid   . Family history of malignant neoplasm of gastrointestinal tract   . Gastritis   . Small bowel obstruction   . Osteonecrosis   . History of gallstones   . Diverticulitis of intestine without perforation or abscess without bleeding     Patient did have abscess but noperforation  . 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 Osteoporosis     History   Social History  . Marital Status: Widowed    Spouse Name: N/A    Number of Children: N/A  . Years of Education: N/A    Occupational History  . Retired     [employer]   Social History Main Topics  . Smoking status: Former Research scientist (life sciences)  . Smokeless tobacco: Never Used     Comment: Quit in 1977  . Alcohol Use: No  . Drug Use: No  . Sexual Activity: No   Other Topics Concern  . Not on file   Social History Narrative  . No narrative on file    Past Surgical History  Procedure Laterality Date  . Cholecystectomy    . Pelvic laparoscopy    . Abdominal hysterectomy  1994    TAH,BSO FOR ENDOMETRIOSIS  . Oophorectomy  1994    TAH,BSO  . Total hip arthroplasty  FALL OF 2008    rt. partial hip replacement  . Abdominal surgery  2011    small intestine blockage  . S/p right shoulder rotater cuff  200216/2011    Tear/adhesive capsulitis  . Sbo lap  11    Adhesions and small internal hernia  . Joint replacement  2008  . Gastroplasty  2011    small bowel resection -open  . Total shoulder arthroplasty  06/05/2012    Procedure: TOTAL SHOULDER ARTHROPLASTY;  Surgeon: Johnny Bridge, MD;  Location: [facility];  Service: Orthopedics;  Laterality: Right;  RIGHT SHOULDER TOTAL ARTHROPLASTY, HEMIARTHROPLASTY, SHOULDER, FOR ARTHRITIS  . Shoulder hemi-arthroplasty  06/05/2012    Procedure: SHOULDER HEMI-ARTHROPLASTY;  Surgeon: Johnny Bridge, MD;  Location: [facility];  Service: Orthopedics;  Laterality: Right;  FOR ARTHRITIS    Family History  Problem Relation Age of Onset  . Heart disease Mother     MI at age 37  . Lymphoma Maternal Grandmother   . Colon cancer Paternal Grandmother   . COPD Father   . Emphysema Mother   . Thyroid disease Mother     Thyroidectomy/Benign    Allergies  Allergen Reactions  . Celecoxib   . Hydrocodone     Itching--has taken Percocet with no problem   . Prednisone Other (See Comments)    Made pt feel "crazy"  . Sulfonamide Derivatives     Current Outpatient Prescriptions on File Prior to Visit  Medication Sig Dispense Refill  . Calcium Carbonate-Vitamin D (CALCIUM + D  PO) Take 2 tablets by mouth daily. 1200 OF CALCIUM +D      . co-enzyme Q-10 30 MG capsule Take 30 mg by mouth 3 (three) times daily.      . sertraline (ZOLOFT) 100 MG tablet Take 1 tablet (100 mg total) by mouth daily.  90 tablet  4   No current facility-administered medications on file prior to visit.    BP 124/80  Temp(Src) 98 F (36.7 C) (Oral)  Ht 5' 5.75" (1.67 m)  Wt 134 lb (60.782 kg)  BMI 21.79 kg/m2  LMP 08/25/1992    Objective:   Physical Exam  Constitutional: She is oriented to person, place, and time. She appears well-developed and well-nourished. No distress.  HENT:  Head: Normocephalic and atraumatic.  Right Ear: External ear normal.  Left Ear: External ear normal.  Oropharyngeal erythema with signs of postnasal drip  Neck: Neck supple.  Negative for neck tenderness  Cardiovascular: Normal rate, regular rhythm and normal heart sounds.   No murmur heard. Pulmonary/Chest: Effort normal. She has no rales.  Scattered expiratory wheezing  Abdominal: Soft. She exhibits no mass. There is no rebound and no guarding.  Minimal right upper quadrant tenderness  Musculoskeletal: She exhibits no edema.  Lymphadenopathy:    She has no cervical adenopathy.  Neurological: She is alert and oriented to person, place, and time. No cranial nerve deficit.  Skin: Skin is warm and dry.  Psychiatric: She has a normal mood and affect. Her behavior is normal.          Assessment & Plan:

## 2013-08-09 NOTE — Progress Notes (Signed)
Pre visit review using our clinic review tool, if applicable. No additional management support is needed unless otherwise documented below in the visit note. 

## 2013-08-12 ENCOUNTER — Ambulatory Visit (INDEPENDENT_AMBULATORY_CARE_PROVIDER_SITE_OTHER)
Admission: RE | Admit: 2013-08-12 | Discharge: 2013-08-12 | Disposition: A | Discharge status: Home / Self Care | Payer: Medicare Other | Source: Ambulatory Visit | Attending: Internal Medicine | Admitting: Internal Medicine

## 2013-08-12 DIAGNOSIS — J40 Bronchitis, not specified as acute or chronic: Secondary | ICD-10-CM

## 2013-08-26 ENCOUNTER — Encounter: Payer: Self-pay | Admitting: Internal Medicine

## 2013-08-26 ENCOUNTER — Ambulatory Visit (INDEPENDENT_AMBULATORY_CARE_PROVIDER_SITE_OTHER): Payer: Medicare Other | Admitting: Internal Medicine

## 2013-08-26 VITALS — BP 130/82 | HR 88 | Temp 98.1°F | Ht 67.5 in | Wt 137.0 lb

## 2013-08-26 DIAGNOSIS — N289 Disorder of kidney and ureter, unspecified: Secondary | ICD-10-CM

## 2013-08-26 DIAGNOSIS — Q613 Polycystic kidney, unspecified: Secondary | ICD-10-CM

## 2013-08-26 DIAGNOSIS — R1011 Right upper quadrant pain: Secondary | ICD-10-CM | POA: Diagnosis not present

## 2013-08-26 LAB — URINALYSIS, ROUTINE W REFLEX MICROSCOPIC
Ketones, ur: NEGATIVE
Leukocytes, UA: NEGATIVE
Nitrite: NEGATIVE
Specific Gravity, Urine: >=1.03 — AB (ref 1.000–1.030)
Urine Glucose: NEGATIVE
Urobilinogen, UA: 0.2 (ref 0.0–1.0)
pH: 5.5 (ref 5.0–8.0)

## 2013-08-26 NOTE — Assessment & Plan Note (Signed)
Patient experiencing unexplained rise in serum creatinine. Followup with her nephrologist-Dr. Justin Mend. Patient has not been using any over-the-counter NSAIDs. She has been taking her blood pressure medication at bedtime which I advised patient to discontinue. Reduce losartan dose 25 mg and take in the morning.

## 2013-08-26 NOTE — Progress Notes (Signed)
Pre visit review using our clinic review tool, if applicable. No additional management support is needed unless otherwise documented below in the visit note. 

## 2013-08-26 NOTE — Progress Notes (Signed)
Subjective:    Patient ID: Sandra Craig, female    DOB: 1941-03-11, 73 y.o.   MRN: [id number]  HPI  73 year old white female with history of polycystic kidney disease, mild renal insufficiency and hypertension for followup.  Patient accompanied by her close friend [place].  Patient's recent serum creatinine elevated at 1.54.  This is significant change to when she was seen by her nephrologist in December of 2014 worsening creatinine was 1.14.  Her previous renal ultrasound in April 2014 was unremarkable.  She denies any use of over-the-counter NSAIDs. She denies any dizziness but takes her blood pressure medication at bedtime.   Review of Systems Still has unexplained intermittent mild RUQ pain, negative for GU symptoms.    Past Medical History  Diagnosis Date  . IBS (irritable bowel syndrome)   . Hypertension   . Hyperlipidemia   . IC (interstitial cystitis)   . Endometriosis   . Polycystic kidney disease   . Osteopenia   . Cystitis   . Fibromyalgia   . Palpitations   . Chronic insomnia   . Pulmonary nodule 12/08    5 mm Anterior RUL  . Hiatal hernia   . Diverticulosis of colon (without mention of hemorrhage)   . Internal hemorrhoid   . Family history of malignant neoplasm of gastrointestinal tract   . Gastritis   . Small bowel obstruction   . Osteonecrosis   . History of gallstones   . Diverticulitis of intestine without perforation or abscess without bleeding     Patient did have abscess but noperforation  . 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 Osteoporosis     History   Social History  . Marital Status: Widowed    Spouse Name: N/A    Number of Children: N/A  . Years of Education: N/A   Occupational History  . Retired     [employer]   Social History Main Topics  . Smoking status: Former Research scientist (life sciences)  . Smokeless tobacco: Never Used     Comment: Quit in 1977  . Alcohol Use: No  . Drug Use: No  . Sexual Activity: No   Other  Topics Concern  . Not on file   Social History Narrative  . No narrative on file    Past Surgical History  Procedure Laterality Date  . Cholecystectomy    . Pelvic laparoscopy    . Abdominal hysterectomy  1994    TAH,BSO FOR ENDOMETRIOSIS  . Oophorectomy  1994    TAH,BSO  . Total hip arthroplasty  FALL OF 2008    rt. partial hip replacement  . Abdominal surgery  2011    small intestine blockage  . S/p right shoulder rotater cuff  200216/2011    Tear/adhesive capsulitis  . Sbo lap  11    Adhesions and small internal hernia  . Joint replacement  2008  . Gastroplasty  2011    small bowel resection -open  . Total shoulder arthroplasty  06/05/2012    Procedure: TOTAL SHOULDER ARTHROPLASTY;  Surgeon: Johnny Bridge, MD;  Location: [facility];  Service: Orthopedics;  Laterality: Right;  RIGHT SHOULDER TOTAL ARTHROPLASTY, HEMIARTHROPLASTY, SHOULDER, FOR ARTHRITIS  . Shoulder hemi-arthroplasty  06/05/2012    Procedure: SHOULDER HEMI-ARTHROPLASTY;  Surgeon: Johnny Bridge, MD;  Location: [facility];  Service: Orthopedics;  Laterality: Right;  FOR ARTHRITIS    Family History  Problem Relation Age of Onset  . Heart disease Mother     MI at age  57  . Lymphoma Maternal Grandmother   . Colon cancer Paternal Grandmother   . COPD Father   . Emphysema Mother   . Thyroid disease Mother     Thyroidectomy/Benign    Allergies  Allergen Reactions  . Celecoxib   . Hydrocodone     Itching--has taken Percocet with no problem   . Prednisone Other (See Comments)    Made pt feel "crazy"  . Sulfonamide Derivatives     Current Outpatient Prescriptions on File Prior to Visit  Medication Sig Dispense Refill  . budesonide-formoterol (SYMBICORT) 160-4.5 MCG/ACT inhaler Inhale 2 puffs into the lungs 2 (two) times daily.  1 Inhaler  0  . co-enzyme Q-10 30 MG capsule Take 30 mg by mouth 3 (three) times daily.      Marland Kitchen gabapentin (NEURONTIN) 100 MG capsule Take 1 capsule (100 mg total) by mouth daily.  90  capsule  1  . rosuvastatin (CRESTOR) 20 MG tablet Take 1 tablet (20 mg total) by mouth daily.  90 tablet  1  . sertraline (ZOLOFT) 100 MG tablet Take 1 tablet (100 mg total) by mouth daily.  90 tablet  4  . zolpidem (AMBIEN) 10 MG tablet Take 1 tablet (10 mg total) by mouth at bedtime as needed. For sleep.  90 tablet  1   No current facility-administered medications on file prior to visit.    BP 130/82  Pulse 88  Temp(Src) 98.1 F (36.7 C) (Oral)  Ht 5' 7.5" (1.715 m)  Wt 137 lb (62.143 kg)  BMI 21.13 kg/m2  LMP 08/25/1992    Objective:   Physical Exam  Constitutional: She is oriented to person, place, and time. She appears well-developed and well-nourished. No distress.  HENT:  Head: Normocephalic and atraumatic.  Neck: Neck supple.  Cardiovascular: Normal rate, regular rhythm and normal heart sounds.   No murmur heard. Pulmonary/Chest: Effort normal. She has no wheezes.  Musculoskeletal: She exhibits no edema.  Neurological: She is alert and oriented to person, place, and time. No cranial nerve deficit.  Psychiatric: She has a normal mood and affect. Her behavior is normal.          Assessment & Plan:

## 2013-08-26 NOTE — Assessment & Plan Note (Addendum)
Patient still having intermittent unexplained RUQ pain.  Previous renal u/s was unremarkable.  Obtain CT of abdomin and pelvis with oral contrast only.  EGD 2013 - Dr. Olevia Perches was normal.

## 2013-08-30 ENCOUNTER — Encounter: Payer: Self-pay | Admitting: Internal Medicine

## 2013-09-03 ENCOUNTER — Inpatient Hospital Stay: Admission: RE | Admit: 2013-09-03 | Payer: Medicare Other | Source: Ambulatory Visit

## 2013-09-03 ENCOUNTER — Ambulatory Visit (HOSPITAL_COMMUNITY)
Admission: RE | Admit: 2013-09-03 | Discharge: 2013-09-03 | Disposition: A | Discharge status: Home / Self Care | Payer: Medicare Other | Source: Ambulatory Visit | Attending: Internal Medicine | Admitting: Internal Medicine

## 2013-09-03 DIAGNOSIS — K7689 Other specified diseases of liver: Secondary | ICD-10-CM | POA: Diagnosis not present

## 2013-09-03 DIAGNOSIS — Q613 Polycystic kidney, unspecified: Secondary | ICD-10-CM | POA: Insufficient documentation

## 2013-09-03 DIAGNOSIS — N289 Disorder of kidney and ureter, unspecified: Secondary | ICD-10-CM | POA: Diagnosis not present

## 2013-09-03 DIAGNOSIS — R1011 Right upper quadrant pain: Secondary | ICD-10-CM | POA: Insufficient documentation

## 2013-09-03 DIAGNOSIS — Z9049 Acquired absence of other specified parts of digestive tract: Secondary | ICD-10-CM | POA: Insufficient documentation

## 2013-09-03 DIAGNOSIS — Z90710 Acquired absence of both cervix and uterus: Secondary | ICD-10-CM | POA: Diagnosis not present

## 2013-09-03 DIAGNOSIS — K838 Other specified diseases of biliary tract: Secondary | ICD-10-CM | POA: Insufficient documentation

## 2013-09-05 ENCOUNTER — Encounter: Payer: Self-pay | Admitting: Internal Medicine

## 2013-10-09 ENCOUNTER — Ambulatory Visit (INDEPENDENT_AMBULATORY_CARE_PROVIDER_SITE_OTHER): Payer: Medicare Other | Admitting: Internal Medicine

## 2013-10-09 ENCOUNTER — Encounter: Payer: Self-pay | Admitting: Internal Medicine

## 2013-10-09 VITALS — BP 118/76 | Temp 98.2°F | Ht 67.5 in | Wt 138.0 lb

## 2013-10-09 DIAGNOSIS — I10 Essential (primary) hypertension: Secondary | ICD-10-CM

## 2013-10-09 DIAGNOSIS — R251 Tremor, unspecified: Secondary | ICD-10-CM

## 2013-10-09 DIAGNOSIS — G250 Essential tremor: Secondary | ICD-10-CM

## 2013-10-09 DIAGNOSIS — R259 Unspecified abnormal involuntary movements: Secondary | ICD-10-CM | POA: Diagnosis not present

## 2013-10-09 DIAGNOSIS — G252 Other specified forms of tremor: Secondary | ICD-10-CM

## 2013-10-09 MED ORDER — METOPROLOL TARTRATE 25 MG PO TABS
12.5000 mg | ORAL_TABLET | Freq: Two times a day (BID) | ORAL | Status: DC
Start: 2013-10-09 — End: 2013-12-20

## 2013-10-09 NOTE — Progress Notes (Signed)
Pre visit review using our clinic review tool, if applicable. No additional management support is needed unless otherwise documented below in the visit note. 

## 2013-10-09 NOTE — Assessment & Plan Note (Signed)
Patient has signs and symptoms of benign tremor. Trial of low-dose beta blocker metoprolol 25 mg one half tablet twice daily. Rule out hyperthyroidism-obtain thyroid function studies.

## 2013-10-09 NOTE — Assessment & Plan Note (Signed)
Patient resumed taking losartan 50 mg once daily. Continue to monitor blood pressure at home. BP: 118/76 mmHg  Lab Results  Component Value Date   CREATININE 1.54* 08/09/2013

## 2013-10-09 NOTE — Progress Notes (Signed)
Subjective:    Patient ID: Sandra Craig, female    DOB: September 14, 1940, 73 y.o.   MRN: [id number]  HPI  73 year old white female with history of polycystic kidney disease, hypertension hyperlipidemia complains of resting tremor. Patient reports her symptoms started after possible viral infection around Christmas of 2014. Patient notices trembling when she tries to right or left being cup of coffee.  She denies any gait abnormalities.  Patient reports "it feels like everything is shaking inside". She also describes sensation of "adrenaline rush".  Hypertension - Patient reports monitoring her blood pressure at home. When she took lower dose of losartan (25 mg) her blood pressure trended upper. She has resumed taking losartan 50 mg once daily.  Review of Systems No weight loss, no gait or balance issues    Past Medical History  Diagnosis Date  . IBS (irritable bowel syndrome)   . Hypertension   . Hyperlipidemia   . IC (interstitial cystitis)   . Endometriosis   . Polycystic kidney disease   . Osteopenia   . Cystitis   . Fibromyalgia   . Palpitations   . Chronic insomnia   . Pulmonary nodule 12/08    5 mm Anterior RUL  . Hiatal hernia   . Diverticulosis of colon (without mention of hemorrhage)   . Internal hemorrhoid   . Family history of malignant neoplasm of gastrointestinal tract   . Gastritis   . Small bowel obstruction   . Osteonecrosis   . History of gallstones   . Diverticulitis of intestine without perforation or abscess without bleeding     Patient did have abscess but noperforation  . 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 Osteoporosis     History   Social History  . Marital Status: Widowed    Spouse Name: N/A    Number of Children: N/A  . Years of Education: N/A   Occupational History  . Retired     [employer]   Social History Main Topics  . Smoking status: Former Research scientist (life sciences)  . Smokeless tobacco: Never Used     Comment: Quit in  1977  . Alcohol Use: No  . Drug Use: No  . Sexual Activity: No   Other Topics Concern  . Not on file   Social History Narrative  . No narrative on file    Past Surgical History  Procedure Laterality Date  . Cholecystectomy    . Pelvic laparoscopy    . Abdominal hysterectomy  1994    TAH,BSO FOR ENDOMETRIOSIS  . Oophorectomy  1994    TAH,BSO  . Total hip arthroplasty  FALL OF 2008    rt. partial hip replacement  . Abdominal surgery  2011    small intestine blockage  . S/p right shoulder rotater cuff  200216/2011    Tear/adhesive capsulitis  . Sbo lap  11    Adhesions and small internal hernia  . Joint replacement  2008  . Gastroplasty  2011    small bowel resection -open  . Total shoulder arthroplasty  06/05/2012    Procedure: TOTAL SHOULDER ARTHROPLASTY;  Surgeon: Johnny Bridge, MD;  Location: [facility];  Service: Orthopedics;  Laterality: Right;  RIGHT SHOULDER TOTAL ARTHROPLASTY, HEMIARTHROPLASTY, SHOULDER, FOR ARTHRITIS  . Shoulder hemi-arthroplasty  06/05/2012    Procedure: SHOULDER HEMI-ARTHROPLASTY;  Surgeon: Johnny Bridge, MD;  Location: [facility];  Service: Orthopedics;  Laterality: Right;  FOR ARTHRITIS    Family History  Problem Relation Age of Onset  .  Heart disease Mother     MI at age 81  . Lymphoma Maternal Grandmother   . Colon cancer Paternal Grandmother   . COPD Father   . Emphysema Mother   . Thyroid disease Mother     Thyroidectomy/Benign    Allergies  Allergen Reactions  . Celecoxib   . Hydrocodone     Itching--has taken Percocet with no problem   . Prednisone Other (See Comments)    Made pt feel "crazy"  . Sulfonamide Derivatives     Current Outpatient Prescriptions on File Prior to Visit  Medication Sig Dispense Refill  . co-enzyme Q-10 30 MG capsule Take 30 mg by mouth 3 (three) times daily.      Marland Kitchen esomeprazole (NEXIUM) 40 MG capsule Take 1 capsule (40 mg total) by mouth daily.  30 capsule  3  . gabapentin (NEURONTIN) 100 MG capsule Take  1 capsule (100 mg total) by mouth daily.  90 capsule  1  . losartan (COZAAR) 50 MG tablet Take 0.5 tablets (25 mg total) by mouth daily.  90 tablet  1  . rosuvastatin (CRESTOR) 20 MG tablet Take 1 tablet (20 mg total) by mouth daily.  90 tablet  1  . sertraline (ZOLOFT) 100 MG tablet Take 1 tablet (100 mg total) by mouth daily.  90 tablet  4  . zolpidem (AMBIEN) 10 MG tablet Take 1 tablet (10 mg total) by mouth at bedtime as needed. For sleep.  90 tablet  1   No current facility-administered medications on file prior to visit.    BP 118/76  Temp(Src) 98.2 F (36.8 C) (Oral)  Ht 5' 7.5" (1.715 m)  Wt 138 lb (62.596 kg)  BMI 21.28 kg/m2  LMP 08/25/1992    Objective:   Physical Exam  Constitutional: She is oriented to person, place, and time. She appears well-developed and well-nourished. No distress.  HENT:  Head: Normocephalic and atraumatic.  Neck: Neck supple. No thyromegaly present.  Cardiovascular: Normal rate, regular rhythm and normal heart sounds.   No murmur heard. Pulmonary/Chest: Effort normal and breath sounds normal. She has no wheezes.  Lymphadenopathy:    She has no cervical adenopathy.  Neurological: She is oriented to person, place, and time. She has normal reflexes. She exhibits normal muscle tone.  Mild resting tremor, no cogwheel rigidity Normal gait.  No dysmetria.  Skin: Skin is warm and dry.  Psychiatric: She has a normal mood and affect. Her behavior is normal.          Assessment & Plan:

## 2013-10-10 LAB — TSH: TSH: 0.48 u[IU]/mL (ref 0.35–5.50)

## 2013-10-10 LAB — T4, FREE: Free T4: 0.64 ng/dL (ref 0.60–1.60)

## 2013-11-04 ENCOUNTER — Telehealth: Payer: Self-pay | Admitting: *Deleted

## 2013-11-04 NOTE — Telephone Encounter (Signed)
Pt was left a message that she is due for her Prolia injection after 11/22/13. Insurance covers it $0 out pocket - secondary insurance picks up what Medicare doesn't pay.  Prolia marked for her. KW CMA

## 2013-11-13 ENCOUNTER — Encounter: Payer: Self-pay | Admitting: Internal Medicine

## 2013-11-14 MED ORDER — GABAPENTIN 100 MG PO CAPS: 100.0000 mg | ORAL_CAPSULE | Freq: Every day | ORAL | Status: DC

## 2013-12-02 ENCOUNTER — Ambulatory Visit (INDEPENDENT_AMBULATORY_CARE_PROVIDER_SITE_OTHER): Payer: Medicare Other | Admitting: Anesthesiology

## 2013-12-02 DIAGNOSIS — M810 Age-related osteoporosis without current pathological fracture: Secondary | ICD-10-CM

## 2013-12-02 MED ORDER — DENOSUMAB 60 MG/ML ~~LOC~~ SOLN
60.0000 mg | Freq: Once | SUBCUTANEOUS | Status: AC
  Administered 2013-12-02: 60 mg via SUBCUTANEOUS

## 2013-12-20 ENCOUNTER — Ambulatory Visit (INDEPENDENT_AMBULATORY_CARE_PROVIDER_SITE_OTHER): Payer: Medicare Other | Admitting: Internal Medicine

## 2013-12-20 ENCOUNTER — Encounter: Payer: Self-pay | Admitting: Internal Medicine

## 2013-12-20 VITALS — BP 112/82 | HR 80 | Temp 98.0°F | Ht 67.5 in | Wt 139.0 lb

## 2013-12-20 DIAGNOSIS — F329 Major depressive disorder, single episode, unspecified: Secondary | ICD-10-CM

## 2013-12-20 DIAGNOSIS — M545 Low back pain, unspecified: Secondary | ICD-10-CM | POA: Diagnosis not present

## 2013-12-20 DIAGNOSIS — F3289 Other specified depressive episodes: Secondary | ICD-10-CM

## 2013-12-20 DIAGNOSIS — G8929 Other chronic pain: Secondary | ICD-10-CM | POA: Insufficient documentation

## 2013-12-20 DIAGNOSIS — F32A Depression, unspecified: Secondary | ICD-10-CM

## 2013-12-20 MED ORDER — SERTRALINE HCL 100 MG PO TABS: 100.0000 mg | ORAL_TABLET | Freq: Every day | ORAL | Status: DC

## 2013-12-20 MED ORDER — OXYCODONE-ACETAMINOPHEN 5-325 MG PO TABS: ORAL_TABLET | ORAL | Status: DC

## 2013-12-20 MED ORDER — METHOCARBAMOL 500 MG PO TABS: 500.0000 mg | ORAL_TABLET | Freq: Three times a day (TID) | ORAL | Status: DC | PRN

## 2013-12-20 MED ORDER — GABAPENTIN 300 MG PO CAPS: 300.0000 mg | ORAL_CAPSULE | Freq: Two times a day (BID) | ORAL | Status: DC

## 2013-12-20 NOTE — Progress Notes (Signed)
Subjective:    Patient ID: Sandra Craig, female    DOB: May 23, 1941, 73 y.o.   MRN: [id number]  HPI  73 year old white female with history of hypertension and polycystic kidney disease complains of chronic low back pain. Her symptoms have been ongoing for over a year but over the last couple weeks patient reports significant worsening of her pain. She describes aching sensation that's usually a 4/10 but at times pain can be 8/10. Her pain worse with prolonged sitting. She feels better with standing. Pain radiates to bilateral buttocks and her right foot felt numb at times.  She denies any history of recent trauma or injury.   Review of Systems No bowel or bladder incontinence    Past Medical History  Diagnosis Date  . IBS (irritable bowel syndrome)   . Hypertension   . Hyperlipidemia   . IC (interstitial cystitis)   . Endometriosis   . Polycystic kidney disease   . Osteopenia   . Cystitis   . Fibromyalgia   . Palpitations   . Chronic insomnia   . Pulmonary nodule 12/08    5 mm Anterior RUL  . Hiatal hernia   . Diverticulosis of colon (without mention of hemorrhage)   . Internal hemorrhoid   . Family history of malignant neoplasm of gastrointestinal tract   . Gastritis   . Small bowel obstruction   . Osteonecrosis   . History of gallstones   . Diverticulitis of intestine without perforation or abscess without bleeding     Patient did have abscess but noperforation  . 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 Osteoporosis     History   Social History  . Marital Status: Widowed    Spouse Name: N/A    Number of Children: N/A  . Years of Education: N/A   Occupational History  . Retired     [employer]   Social History Main Topics  . Smoking status: Former Research scientist (life sciences)  . Smokeless tobacco: Never Used     Comment: Quit in 1977  . Alcohol Use: No  . Drug Use: No  . Sexual Activity: No   Other Topics Concern  . Not on file   Social History  Narrative  . No narrative on file    Past Surgical History  Procedure Laterality Date  . Cholecystectomy    . Pelvic laparoscopy    . Abdominal hysterectomy  1994    TAH,BSO FOR ENDOMETRIOSIS  . Oophorectomy  1994    TAH,BSO  . Total hip arthroplasty  FALL OF 2008    rt. partial hip replacement  . Abdominal surgery  2011    small intestine blockage  . S/p right shoulder rotater cuff  200216/2011    Tear/adhesive capsulitis  . Sbo lap  11    Adhesions and small internal hernia  . Joint replacement  2008  . Gastroplasty  2011    small bowel resection -open  . Total shoulder arthroplasty  06/05/2012    Procedure: TOTAL SHOULDER ARTHROPLASTY;  Surgeon: Johnny Bridge, MD;  Location: [facility];  Service: Orthopedics;  Laterality: Right;  RIGHT SHOULDER TOTAL ARTHROPLASTY, HEMIARTHROPLASTY, SHOULDER, FOR ARTHRITIS  . Shoulder hemi-arthroplasty  06/05/2012    Procedure: SHOULDER HEMI-ARTHROPLASTY;  Surgeon: Johnny Bridge, MD;  Location: [facility];  Service: Orthopedics;  Laterality: Right;  FOR ARTHRITIS    Family History  Problem Relation Age of Onset  . Heart disease Mother     MI at age 25  .  Lymphoma Maternal Grandmother   . Colon cancer Paternal Grandmother   . COPD Father   . Emphysema Mother   . Thyroid disease Mother     Thyroidectomy/Benign    Allergies  Allergen Reactions  . Celecoxib   . Hydrocodone     Itching--has taken Percocet with no problem   . Prednisone Other (See Comments)    Made pt feel "crazy"  . Sulfonamide Derivatives     Current Outpatient Prescriptions on File Prior to Visit  Medication Sig Dispense Refill  . co-enzyme Q-10 30 MG capsule Take 30 mg by mouth 3 (three) times daily.      Marland Kitchen esomeprazole (NEXIUM) 40 MG capsule Take 1 capsule (40 mg total) by mouth daily.  30 capsule  3  . losartan (COZAAR) 50 MG tablet Take 1 tablet (50 mg total) by mouth daily.  90 tablet  1  . rosuvastatin (CRESTOR) 20 MG tablet Take 1 tablet (20 mg total) by mouth  daily.  90 tablet  1  . zolpidem (AMBIEN) 10 MG tablet Take 1 tablet (10 mg total) by mouth at bedtime as needed. For sleep.  90 tablet  1   No current facility-administered medications on file prior to visit.    BP 112/82  Pulse 80  Temp(Src) 98 F (36.7 C) (Oral)  Ht 5' 7.5" (1.715 m)  Wt 139 lb (63.05 kg)  BMI 21.44 kg/m2  LMP 08/25/1992    Objective:   Physical Exam  Constitutional: She is oriented to person, place, and time. She appears well-developed and well-nourished.  HENT:  Head: Normocephalic and atraumatic.  Cardiovascular: Normal rate, regular rhythm and normal heart sounds.   Pulmonary/Chest: Effort normal and breath sounds normal. She has no wheezes.  Musculoskeletal: She exhibits no edema.  Discomfort with lumbar flexion, extension and leftward sidebending  Neurological: She is alert and oriented to person, place, and time. No cranial nerve deficit.  Right patellar reflex +3, left patellar reflex +2 Patient able to toe and heel walk without difficulty Patient struggles with getting up from squatting position  Skin: Skin is warm and dry.  Psychiatric: She has a normal mood and affect. Her behavior is normal.          Assessment & Plan:

## 2013-12-20 NOTE — Assessment & Plan Note (Signed)
73 year old white female presents with acute on chronic low back pain. She has difficulty with lumbar flexion, extension and leftward side bending. She has radicular symptoms to bilateral buttocks and right foot. Patient may have lumbar radiculopathy versus multilevel spondylosis. Obtain MRI of lumbar spine. Patient advised to avoid using NSAIDs considering history of polycystic kidney disease and mild renal insufficiency. Treat with oxycodone 5/325 mg one half to one tablet twice a day as needed. Also use muscle relaxer Robaxin 500 mg 3 times a day as needed.  Increase gabapentin to 300 mg twice daily. Patient instructed how to titrate over the next couple of weeks.

## 2013-12-20 NOTE — Patient Instructions (Signed)
Take gabapentin 300 mg at bedtime for 1 week then increase to twice daily Decrease your ambien dose to 5 mg when taking higher dose of gabapentin

## 2013-12-20 NOTE — Progress Notes (Signed)
Pre visit review using our clinic review tool, if applicable. No additional management support is needed unless otherwise documented below in the visit note. 

## 2013-12-25 ENCOUNTER — Telehealth: Payer: Self-pay | Admitting: Internal Medicine

## 2013-12-25 MED ORDER — LOSARTAN POTASSIUM 50 MG PO TABS: 50.0000 mg | ORAL_TABLET | Freq: Every day | ORAL | Status: DC

## 2013-12-25 MED ORDER — ROSUVASTATIN CALCIUM 20 MG PO TABS: 20.0000 mg | ORAL_TABLET | Freq: Every day | ORAL | Status: DC

## 2013-12-25 NOTE — Telephone Encounter (Signed)
rx sent in electronically 

## 2013-12-25 NOTE — Telephone Encounter (Signed)
[facility] (MAIL [facility] - [place], [place] is requesting 90 day re-fills on the following: losartan (COZAAR) 50 MG tablet rosuvastatin (CRESTOR) 20 MG tablet

## 2013-12-29 ENCOUNTER — Other Ambulatory Visit: Payer: Medicare Other

## 2014-01-06 ENCOUNTER — Ambulatory Visit
Admission: RE | Admit: 2014-01-06 | Discharge: 2014-01-06 | Disposition: A | Discharge status: Home / Self Care | Payer: Medicare Other | Source: Ambulatory Visit | Attending: Internal Medicine | Admitting: Internal Medicine

## 2014-01-06 DIAGNOSIS — M5126 Other intervertebral disc displacement, lumbar region: Secondary | ICD-10-CM | POA: Diagnosis not present

## 2014-01-06 DIAGNOSIS — M545 Low back pain, unspecified: Secondary | ICD-10-CM

## 2014-01-06 DIAGNOSIS — M4310 Spondylolisthesis, site unspecified: Secondary | ICD-10-CM | POA: Diagnosis not present

## 2014-01-06 DIAGNOSIS — M47817 Spondylosis without myelopathy or radiculopathy, lumbosacral region: Secondary | ICD-10-CM | POA: Diagnosis not present

## 2014-01-07 ENCOUNTER — Telehealth: Payer: Self-pay | Admitting: Internal Medicine

## 2014-01-07 DIAGNOSIS — M5136 Other intervertebral disc degeneration, lumbar region: Secondary | ICD-10-CM

## 2014-01-07 DIAGNOSIS — M5126 Other intervertebral disc displacement, lumbar region: Secondary | ICD-10-CM

## 2014-01-07 NOTE — Telephone Encounter (Signed)
Seen MRI report where Dr Shawna Orleans states to refer to Neurosurgery, referral order placed

## 2014-01-07 NOTE — Telephone Encounter (Signed)
Pt was seen on 5-22 for back pain and is waiting for a referral to neurosurgeon. Please advise

## 2014-01-22 DIAGNOSIS — M4310 Spondylolisthesis, site unspecified: Secondary | ICD-10-CM | POA: Diagnosis not present

## 2014-01-29 DIAGNOSIS — IMO0002 Reserved for concepts with insufficient information to code with codable children: Secondary | ICD-10-CM | POA: Diagnosis not present

## 2014-02-05 DIAGNOSIS — M25519 Pain in unspecified shoulder: Secondary | ICD-10-CM | POA: Diagnosis not present

## 2014-02-05 DIAGNOSIS — M25559 Pain in unspecified hip: Secondary | ICD-10-CM | POA: Diagnosis not present

## 2014-02-17 DIAGNOSIS — M545 Low back pain, unspecified: Secondary | ICD-10-CM | POA: Diagnosis not present

## 2014-02-19 DIAGNOSIS — M545 Low back pain, unspecified: Secondary | ICD-10-CM | POA: Diagnosis not present

## 2014-02-20 DIAGNOSIS — IMO0002 Reserved for concepts with insufficient information to code with codable children: Secondary | ICD-10-CM | POA: Diagnosis not present

## 2014-02-21 ENCOUNTER — Telehealth: Payer: Self-pay | Admitting: Internal Medicine

## 2014-02-21 NOTE — Telephone Encounter (Signed)
Pt needs a new rx sent to [facility] zolpidem 10 mg #90 with refills

## 2014-02-21 NOTE — Telephone Encounter (Signed)
Pt is leaving for [place] on sun and [facility] is not open on sun

## 2014-02-24 NOTE — Telephone Encounter (Signed)
[place] for [employer]

## 2014-02-25 NOTE — Telephone Encounter (Signed)
Pt has already for [place].  Unable to reach

## 2014-03-04 DIAGNOSIS — M545 Low back pain, unspecified: Secondary | ICD-10-CM | POA: Diagnosis not present

## 2014-03-05 DIAGNOSIS — M25519 Pain in unspecified shoulder: Secondary | ICD-10-CM | POA: Diagnosis not present

## 2014-03-06 ENCOUNTER — Encounter: Payer: Self-pay | Admitting: Internal Medicine

## 2014-03-07 ENCOUNTER — Encounter: Payer: Self-pay | Admitting: Internal Medicine

## 2014-03-07 ENCOUNTER — Ambulatory Visit (INDEPENDENT_AMBULATORY_CARE_PROVIDER_SITE_OTHER): Payer: Medicare Other | Admitting: Internal Medicine

## 2014-03-07 VITALS — BP 142/82 | Temp 98.5°F | Ht 67.5 in | Wt 142.0 lb

## 2014-03-07 DIAGNOSIS — IMO0001 Reserved for inherently not codable concepts without codable children: Secondary | ICD-10-CM

## 2014-03-07 DIAGNOSIS — M545 Low back pain, unspecified: Secondary | ICD-10-CM | POA: Diagnosis not present

## 2014-03-07 DIAGNOSIS — G4700 Insomnia, unspecified: Secondary | ICD-10-CM | POA: Diagnosis not present

## 2014-03-07 DIAGNOSIS — M797 Fibromyalgia: Secondary | ICD-10-CM

## 2014-03-07 DIAGNOSIS — I10 Essential (primary) hypertension: Secondary | ICD-10-CM

## 2014-03-07 DIAGNOSIS — G8929 Other chronic pain: Secondary | ICD-10-CM

## 2014-03-07 DIAGNOSIS — M25511 Pain in right shoulder: Secondary | ICD-10-CM

## 2014-03-07 DIAGNOSIS — M25519 Pain in unspecified shoulder: Secondary | ICD-10-CM

## 2014-03-07 IMAGING — CT CT ABD-PELV W/ CM
2 of 5 series · 17 of 46 positions shown, 19 images · IV contrast (APPLIED)
Comparison: CT scan dated 02/11/2012

CLINICAL DATA: Diverticulitis.  Worsening abdominal pain.

CT ABDOMEN AND PELVIS WITH CONTRAST
TECHNIQUE: Multidetector CT imaging of the abdomen and pelvis was
performed following the standard protocol during bolus
administration of intravenous contrast.
Contrast: 100mL OMNIPAQUE IOHEXOL 300 MG/ML  SOLN

[Series 2: abd/pelv with 5.0 b31f st · axial · 0.87mm/px · z∈[-619,-209]mm · 14 of 92 slices shown, 16 images]
[im 5/92  soft-tissue]
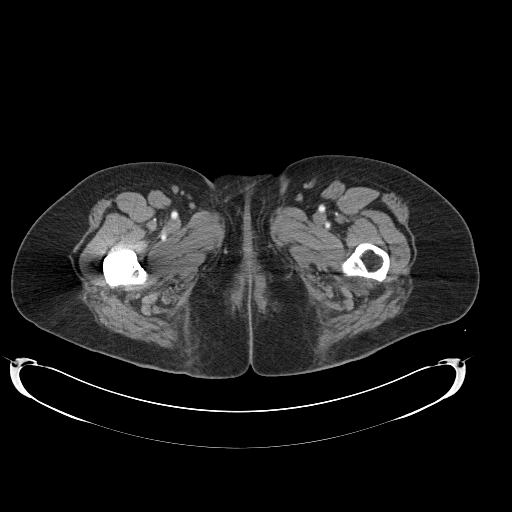
[im 5/92  bone]
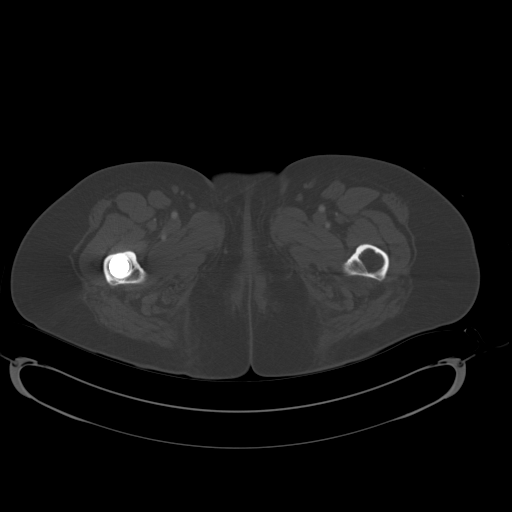
[im 10/92  soft-tissue]
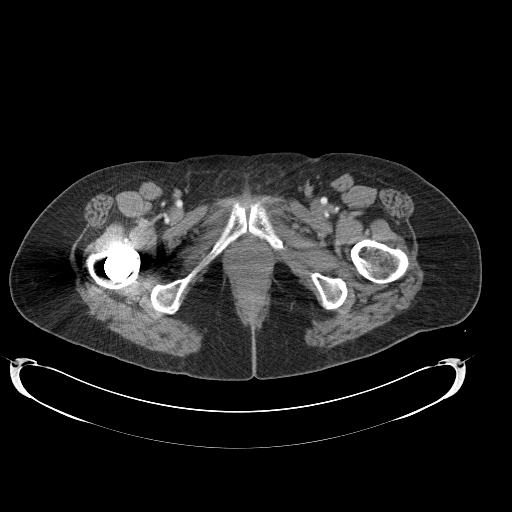
[im 20/92  soft-tissue]
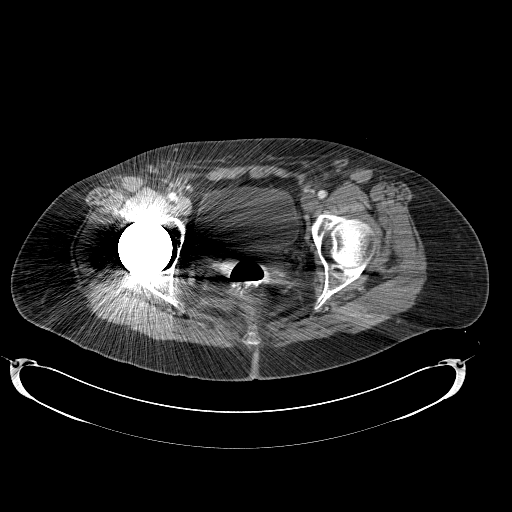
[im 24/92  soft-tissue]
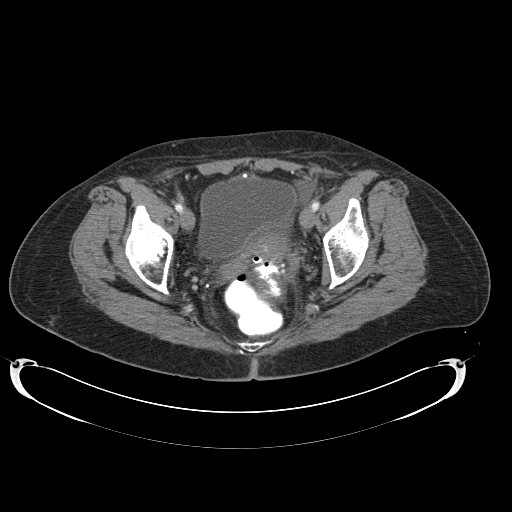
[im 29/92  soft-tissue]
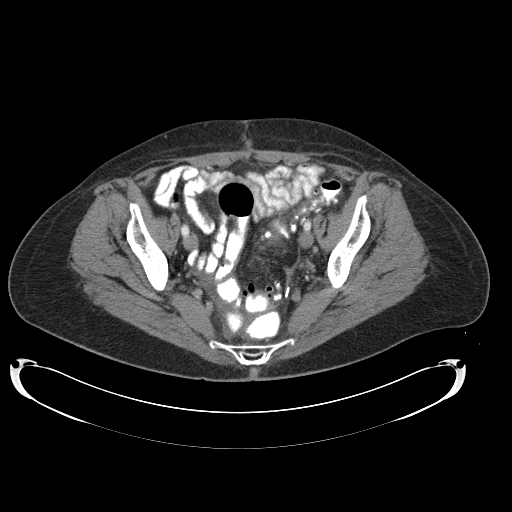
[im 39/92  soft-tissue]
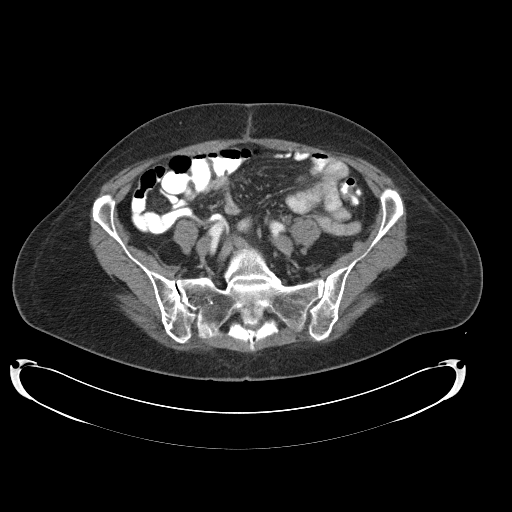
[im 44/92  soft-tissue]
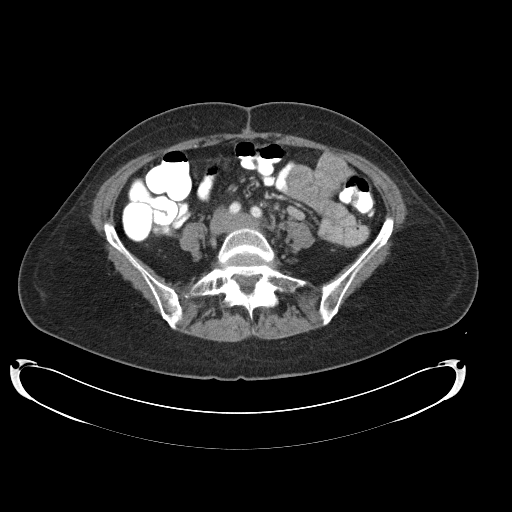
[im 48/92  soft-tissue]
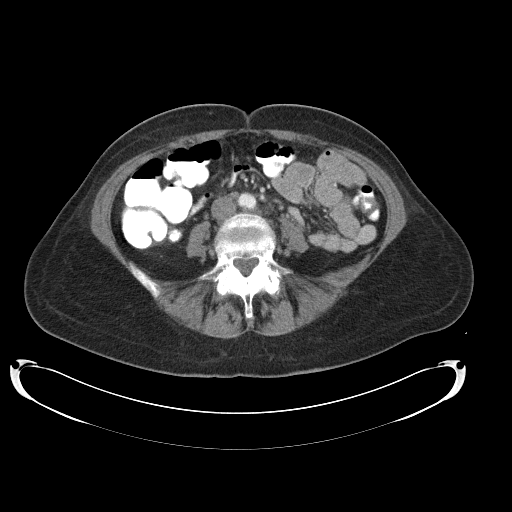
[im 53/92  soft-tissue]
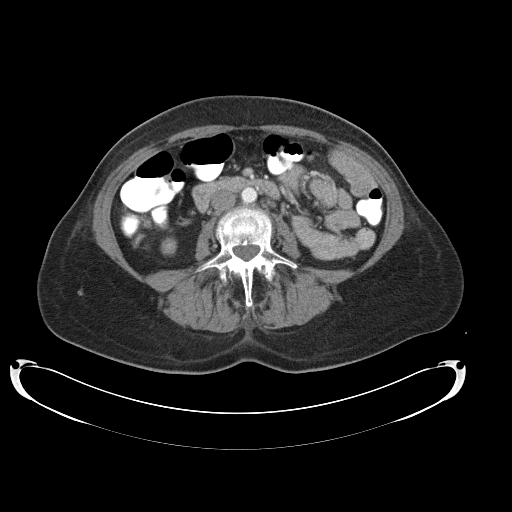
[im 53/92  bone]
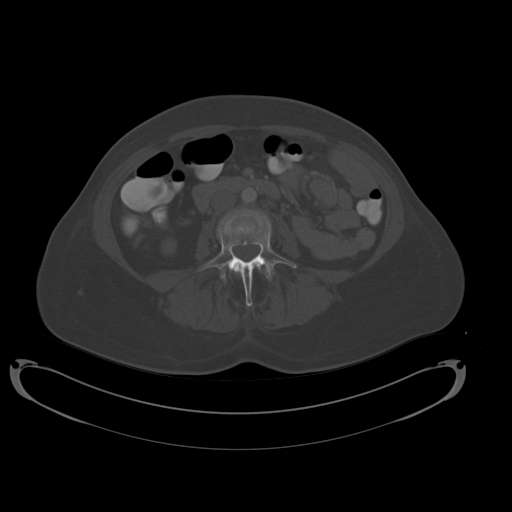
[im 63/92  soft-tissue]
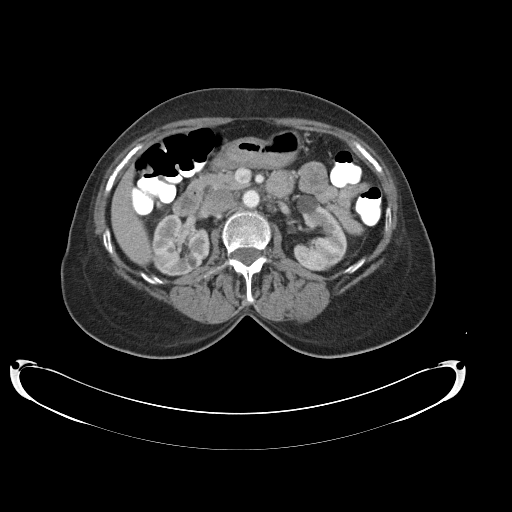
[im 68/92  soft-tissue]
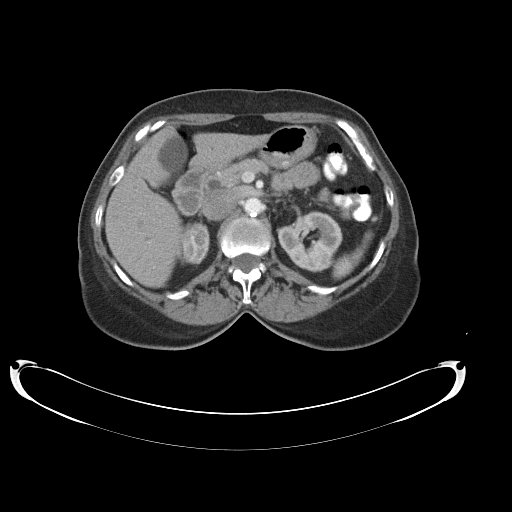
[im 72/92  soft-tissue]
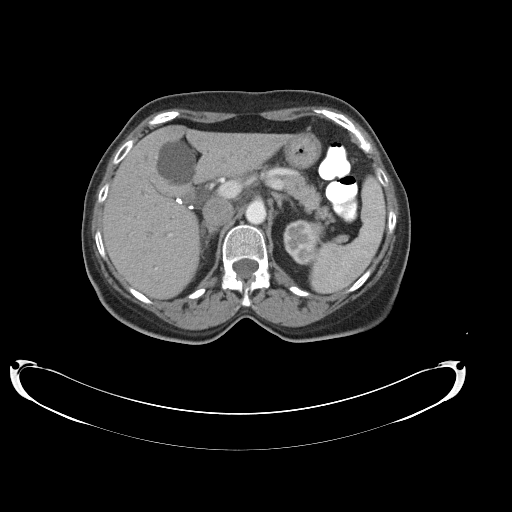
[im 82/92  soft-tissue]
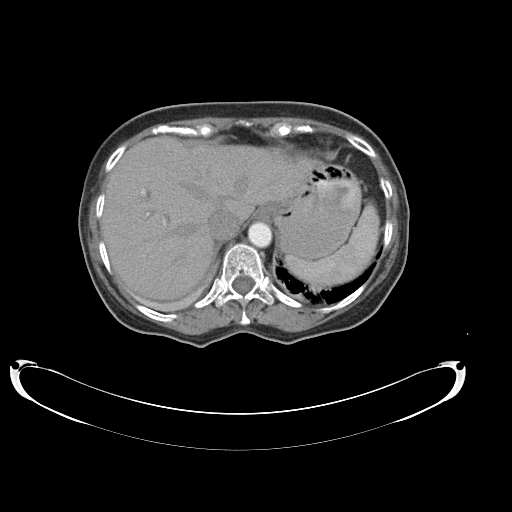
[im 87/92  soft-tissue]
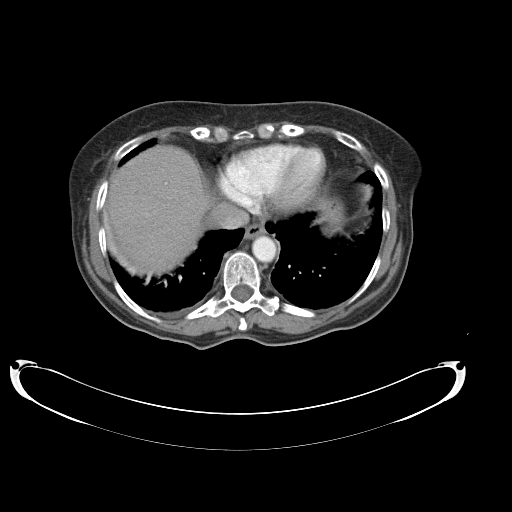

[Series 6: coronals · coronal · 0.90mm/px · 3 of 67 slices shown]
[im 23/67  soft-tissue]
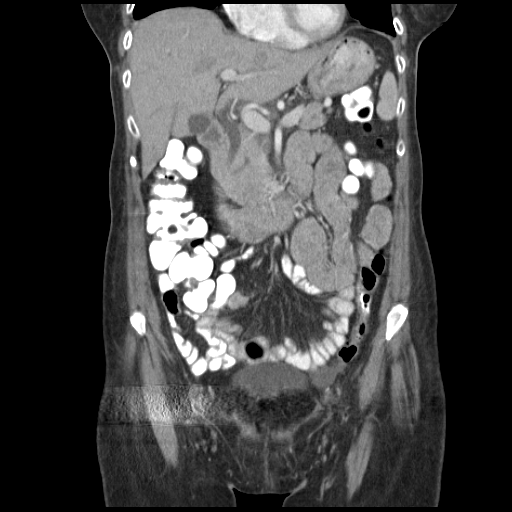
[im 30/67  soft-tissue]
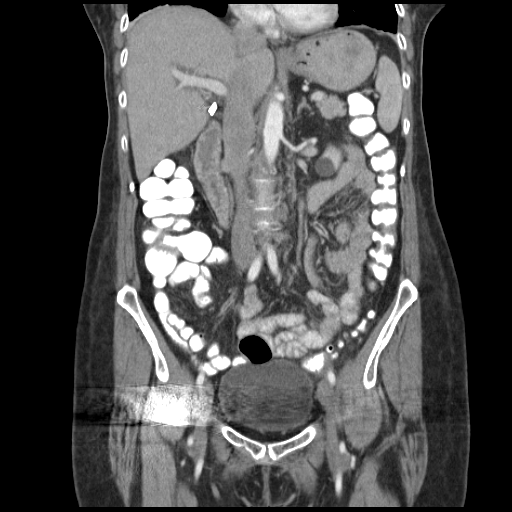
[im 37/67  soft-tissue]
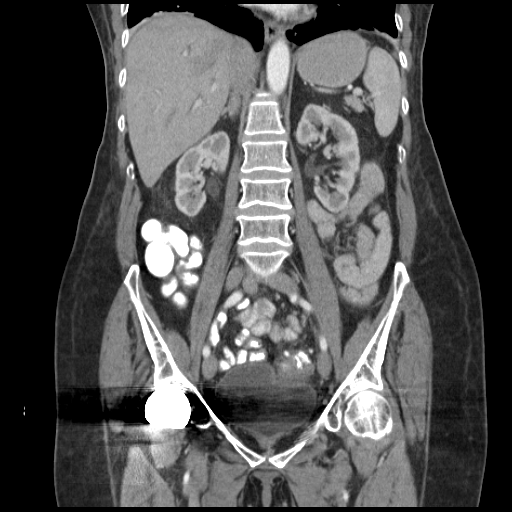

[17 of 46 positions shown; findings below may reference images not displayed]

FINDINGS: Again noted is acute diverticulitis of the mid sigmoid
colon at the left posterior lateral aspect of the bladder.  There
is a phlegmon without discrete abscess, which has slightly
diminished in size since the prior study.  There is no free air.
There is a small amount of free fluid in the pelvis which has
minimally increased.  Small amount of contrast is seen going into
the phlegmon at the site of the ruptured diverticulum.

There is a benign appearance of the remainder of the abdomen.
Previous cholecystectomy with slight biliary ductal dilatation,
unchanged.  Benign cyst in the left lobe of the liver.

Multiple benign-appearing cysts in both kidneys.

No acute osseous abnormality.  Degenerative facet arthritis in the
lower lumbar spine.  Right total hip prosthesis.
IMPRESSION: 1.  Slight improvement in the acute diverticulitis and phlegmon in
the pelvis.
2.  Slight increase in small amount of free fluid in the pelvis.

## 2014-03-07 MED ORDER — ZOLPIDEM TARTRATE 10 MG PO TABS: 10.0000 mg | ORAL_TABLET | Freq: Every evening | ORAL | Status: DC | PRN

## 2014-03-07 NOTE — Assessment & Plan Note (Signed)
BP occasionally exacerbated by pain response.  No change in medication.  Continue to monitor BP at home. BP: 142/82 mmHg

## 2014-03-07 NOTE — Progress Notes (Signed)
Subjective:    Patient ID: Sandra Craig, female    DOB: March 19, 1941, 73 y.o.   MRN: [id number]  HPI  73 year old white female with history of chronic low back pain, hypertension and right shoulder pain for followup. Interval history patient seen by her orthopedic specialist. He is not sure why she continues to have persistent right shoulder pain after surgery. She declined further exploratory surgery. She is considering other physical therapy options (dry needling).  Patient reports her low back pain is getting better after back injections.   Hypertension-her home blood pressure readings are normal with systolic blood pressures in the 409W and diastolic blood pressures in the 70s. Patient reports she experiences higher blood pressures due to pain reaction.  Review of Systems Negative for headache.  Insomnia is stable.  She takes same dose of zolpidem    Past Medical History  Diagnosis Date  . IBS (irritable bowel syndrome)   . Hypertension   . Hyperlipidemia   . IC (interstitial cystitis)   . Endometriosis   . Polycystic kidney disease   . Osteopenia   . Cystitis   . Fibromyalgia   . Palpitations   . Chronic insomnia   . Pulmonary nodule 12/08    5 mm Anterior RUL  . Hiatal hernia   . Diverticulosis of colon (without mention of hemorrhage)   . Internal hemorrhoid   . Family history of malignant neoplasm of gastrointestinal tract   . Gastritis   . Small bowel obstruction   . Osteonecrosis   . History of gallstones   . Diverticulitis of intestine without perforation or abscess without bleeding     Patient did have abscess but noperforation  . 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 Osteoporosis     History   Social History  . Marital Status: Widowed    Spouse Name: N/A    Number of Children: N/A  . Years of Education: N/A   Occupational History  . Retired     [employer]   Social History Main Topics  . Smoking status: Former Research scientist (life sciences)    . Smokeless tobacco: Never Used     Comment: Quit in 1977  . Alcohol Use: No  . Drug Use: No  . Sexual Activity: No   Other Topics Concern  . Not on file   Social History Narrative  . No narrative on file    Past Surgical History  Procedure Laterality Date  . Cholecystectomy    . Pelvic laparoscopy    . Abdominal hysterectomy  1994    TAH,BSO FOR ENDOMETRIOSIS  . Oophorectomy  1994    TAH,BSO  . Total hip arthroplasty  FALL OF 2008    rt. partial hip replacement  . Abdominal surgery  2011    small intestine blockage  . S/p right shoulder rotater cuff  200216/2011    Tear/adhesive capsulitis  . Sbo lap  11    Adhesions and small internal hernia  . Joint replacement  2008  . Gastroplasty  2011    small bowel resection -open  . Total shoulder arthroplasty  06/05/2012    Procedure: TOTAL SHOULDER ARTHROPLASTY;  Surgeon: Johnny Bridge, MD;  Location: [place];  Service: Orthopedics;  Laterality: Right;  RIGHT SHOULDER TOTAL ARTHROPLASTY, HEMIARTHROPLASTY, SHOULDER, FOR ARTHRITIS  . Shoulder hemi-arthroplasty  06/05/2012    Procedure: SHOULDER HEMI-ARTHROPLASTY;  Surgeon: Johnny Bridge, MD;  Location: [place];  Service: Orthopedics;  Laterality: Right;  FOR ARTHRITIS  Family History  Problem Relation Age of Onset  . Heart disease Mother     MI at age 36  . Lymphoma Maternal Grandmother   . Colon cancer Paternal Grandmother   . COPD Father   . Emphysema Mother   . Thyroid disease Mother     Thyroidectomy/Benign    Allergies  Allergen Reactions  . Celecoxib   . Hydrocodone     Itching--has taken Percocet with no problem   . Prednisone Other (See Comments)    Made pt feel "crazy"  . Sulfonamide Derivatives     Current Outpatient Prescriptions on File Prior to Visit  Medication Sig Dispense Refill  . Calcium Carbonate-Vit D-Min (CALCIUM 1200 PO) Take 1 capsule by mouth daily.      Marland Kitchen co-enzyme Q-10 30 MG capsule Take 30 mg by mouth 3 (three) times daily.      Marland Kitchen  esomeprazole (NEXIUM) 40 MG capsule Take 1 capsule (40 mg total) by mouth daily.  30 capsule  3  . gabapentin (NEURONTIN) 300 MG capsule Take 1 capsule (300 mg total) by mouth 2 (two) times daily.  180 capsule  1  . losartan (COZAAR) 50 MG tablet Take 1 tablet (50 mg total) by mouth daily.  90 tablet  1  . rosuvastatin (CRESTOR) 20 MG tablet Take 1 tablet (20 mg total) by mouth daily.  90 tablet  1  . sertraline (ZOLOFT) 100 MG tablet Take 1 tablet (100 mg total) by mouth daily.  90 tablet  3   No current facility-administered medications on file prior to visit.    BP 142/82  Temp(Src) 98.5 F (36.9 C) (Oral)  Ht 5' 7.5" (1.715 m)  Wt 142 lb (64.411 kg)  BMI 21.90 kg/m2  LMP 08/25/1992    Objective:   Physical Exam  Constitutional: She is oriented to person, place, and time. She appears well-developed and well-nourished.  HENT:  Head: Normocephalic and atraumatic.  Cardiovascular: Normal rate, regular rhythm and normal heart sounds.   No murmur heard. Pulmonary/Chest: Effort normal and breath sounds normal. She has no wheezes.  Musculoskeletal: She exhibits no edema.  Multiple trigger points on back,  Limited ROM right shoulder  Neurological: She is alert and oriented to person, place, and time. No cranial nerve deficit.  Skin: Skin is warm and dry.  Psychiatric: She has a normal mood and affect. Her behavior is normal.          Assessment & Plan:

## 2014-03-07 NOTE — Assessment & Plan Note (Signed)
Patient experiencing persistent right shoulder pain after surgery. She defers further exploratory surgery. She wants to explore other physical therapy modalities including dry needling and/or acupuncture.

## 2014-03-07 NOTE — Assessment & Plan Note (Signed)
No change.  Continue same dose of zolpidem as needed.

## 2014-03-07 NOTE — Progress Notes (Signed)
Pre visit review using our clinic review tool, if applicable. No additional management support is needed unless otherwise documented below in the visit note. 

## 2014-03-10 DIAGNOSIS — M545 Low back pain, unspecified: Secondary | ICD-10-CM | POA: Diagnosis not present

## 2014-03-12 DIAGNOSIS — M545 Low back pain, unspecified: Secondary | ICD-10-CM | POA: Diagnosis not present

## 2014-03-18 DIAGNOSIS — M545 Low back pain, unspecified: Secondary | ICD-10-CM | POA: Diagnosis not present

## 2014-03-20 DIAGNOSIS — M545 Low back pain, unspecified: Secondary | ICD-10-CM | POA: Diagnosis not present

## 2014-03-26 DIAGNOSIS — M545 Low back pain, unspecified: Secondary | ICD-10-CM | POA: Diagnosis not present

## 2014-03-26 DIAGNOSIS — Z23 Encounter for immunization: Secondary | ICD-10-CM | POA: Diagnosis not present

## 2014-04-22 DIAGNOSIS — H02839 Dermatochalasis of unspecified eye, unspecified eyelid: Secondary | ICD-10-CM | POA: Diagnosis not present

## 2014-04-22 DIAGNOSIS — I10 Essential (primary) hypertension: Secondary | ICD-10-CM | POA: Diagnosis not present

## 2014-04-22 DIAGNOSIS — H2510 Age-related nuclear cataract, unspecified eye: Secondary | ICD-10-CM | POA: Diagnosis not present

## 2014-05-19 DIAGNOSIS — H2511 Age-related nuclear cataract, right eye: Secondary | ICD-10-CM | POA: Diagnosis not present

## 2014-05-19 DIAGNOSIS — H269 Unspecified cataract: Secondary | ICD-10-CM | POA: Diagnosis not present

## 2014-05-20 DIAGNOSIS — H2512 Age-related nuclear cataract, left eye: Secondary | ICD-10-CM | POA: Diagnosis not present

## 2014-06-02 ENCOUNTER — Encounter: Payer: Self-pay | Admitting: Internal Medicine

## 2014-06-16 ENCOUNTER — Telehealth: Payer: Self-pay | Admitting: Internal Medicine

## 2014-06-16 DIAGNOSIS — H269 Unspecified cataract: Secondary | ICD-10-CM | POA: Diagnosis not present

## 2014-06-16 DIAGNOSIS — H2512 Age-related nuclear cataract, left eye: Secondary | ICD-10-CM | POA: Diagnosis not present

## 2014-06-16 MED ORDER — ROSUVASTATIN CALCIUM 20 MG PO TABS: 20.0000 mg | ORAL_TABLET | Freq: Every day | ORAL | Status: DC

## 2014-06-16 MED ORDER — GABAPENTIN 300 MG PO CAPS: 300.0000 mg | ORAL_CAPSULE | Freq: Two times a day (BID) | ORAL | Status: DC

## 2014-06-16 MED ORDER — LOSARTAN POTASSIUM 50 MG PO TABS: 50.0000 mg | ORAL_TABLET | Freq: Every day | ORAL | Status: DC

## 2014-06-16 NOTE — Telephone Encounter (Signed)
rx sent electronically. 

## 2014-06-16 NOTE — Telephone Encounter (Signed)
[facility] (MAIL ORDER) [facility] - [place], [place] is requesting re-fills on gabapentin (NEURONTIN) 300 MG capsule, losartan (COZAAR) 50 MG tablet, rosuvastatin (CRESTOR) 20 MG tablet

## 2014-06-24 ENCOUNTER — Encounter: Payer: Self-pay | Admitting: Gynecology

## 2014-06-24 NOTE — Progress Notes (Unsigned)
Patient ID: Sandra Craig, female   DOB: [birth year], 73 y.o.   MRN: [id number] Patient due for Prolia after 06/04/2014, overdue for AEX , had appointment desk call patient to schedule . Advised patient she could also receive Prolia injection the same day as appointment for AEX. Patient insurance covers Prolia 100 % after deductible has been met. Appointment desk informed me that she has appointment with [name] on 07/02/2014. PFalls

## 2014-07-02 ENCOUNTER — Encounter: Payer: Medicare Other | Admitting: Women's Health

## 2014-07-02 DIAGNOSIS — Z1231 Encounter for screening mammogram for malignant neoplasm of breast: Secondary | ICD-10-CM | POA: Diagnosis not present

## 2014-07-02 LAB — HM MAMMOGRAPHY: HM Mammogram: NEGATIVE

## 2014-07-03 ENCOUNTER — Encounter: Payer: Self-pay | Admitting: Women's Health

## 2014-07-04 ENCOUNTER — Telehealth: Payer: Self-pay | Admitting: Gynecology

## 2014-07-04 NOTE — Telephone Encounter (Signed)
Talked with Sandra Craig regarding her Complete exam with [name] and Prolia injection. Explained that Prolia has been approved and that her OOP has been met and she has a $ 0  Deductible which will be thru 07/31/2014. She has an appointment with [name] on 07/17/2014 for the Prolia and Complete with [name].

## 2014-07-08 DIAGNOSIS — N183 Chronic kidney disease, stage 3 (moderate): Secondary | ICD-10-CM | POA: Diagnosis not present

## 2014-07-08 DIAGNOSIS — N189 Chronic kidney disease, unspecified: Secondary | ICD-10-CM | POA: Diagnosis not present

## 2014-07-09 ENCOUNTER — Encounter: Payer: Self-pay | Admitting: Internal Medicine

## 2014-07-14 ENCOUNTER — Ambulatory Visit (INDEPENDENT_AMBULATORY_CARE_PROVIDER_SITE_OTHER): Payer: Medicare Other | Admitting: Family

## 2014-07-14 ENCOUNTER — Encounter: Payer: Self-pay | Admitting: Family

## 2014-07-14 VITALS — BP 122/84 | HR 103 | Temp 99.4°F | Wt 145.8 lb

## 2014-07-14 DIAGNOSIS — J209 Acute bronchitis, unspecified: Secondary | ICD-10-CM | POA: Diagnosis not present

## 2014-07-14 DIAGNOSIS — R059 Cough, unspecified: Secondary | ICD-10-CM

## 2014-07-14 DIAGNOSIS — R05 Cough: Secondary | ICD-10-CM | POA: Diagnosis not present

## 2014-07-14 MED ORDER — GUAIFENESIN-CODEINE 100-10 MG/5ML PO SYRP: 5.0000 mL | ORAL_SOLUTION | Freq: Three times a day (TID) | ORAL | Status: DC | PRN

## 2014-07-14 MED ORDER — METHYLPREDNISOLONE 4 MG PO KIT: PACK | ORAL | Status: AC

## 2014-07-14 NOTE — Progress Notes (Signed)
Pre visit review using our clinic review tool, if applicable. No additional management support is needed unless otherwise documented below in the visit note. 

## 2014-07-14 NOTE — Patient Instructions (Signed)

## 2014-07-15 ENCOUNTER — Telehealth: Payer: Self-pay | Admitting: Gynecology

## 2014-07-15 DIAGNOSIS — D631 Anemia in chronic kidney disease: Secondary | ICD-10-CM | POA: Diagnosis not present

## 2014-07-15 DIAGNOSIS — E785 Hyperlipidemia, unspecified: Secondary | ICD-10-CM | POA: Diagnosis not present

## 2014-07-15 DIAGNOSIS — N2581 Secondary hyperparathyroidism of renal origin: Secondary | ICD-10-CM | POA: Diagnosis not present

## 2014-07-15 DIAGNOSIS — N183 Chronic kidney disease, stage 3 (moderate): Secondary | ICD-10-CM | POA: Diagnosis not present

## 2014-07-15 DIAGNOSIS — Q613 Polycystic kidney, unspecified: Secondary | ICD-10-CM | POA: Diagnosis not present

## 2014-07-15 NOTE — Telephone Encounter (Signed)
Phone call from patient to office. Sandra Craig stated that patient does not want to continue the Prolia injection this year.

## 2014-07-15 NOTE — Progress Notes (Signed)
Subjective:    Patient ID: Sandra Craig, female    DOB: 06-06-1941, 73 y.o.   MRN: [id number]  HPI  73 year old white female, nonsmoker is in today with complaints of cough, congestion, ear pain and fever 1 day. Denies any nausea vomiting headache or sore throat. Has not been taking any medication over-the-counter.  Review of Systems  Constitutional: Negative.   HENT: Positive for congestion.   Respiratory: Positive for cough.   Cardiovascular: Negative.   Gastrointestinal: Negative.   Endocrine: Negative.   Genitourinary: Negative.   Musculoskeletal: Negative.   Skin: Negative.   Allergic/Immunologic: Negative.   Neurological: Negative.   Psychiatric/Behavioral: Negative.    Past Medical History  Diagnosis Date  . IBS (irritable bowel syndrome)   . Hypertension   . Hyperlipidemia   . IC (interstitial cystitis)   . Endometriosis   . Polycystic kidney disease   . Osteopenia   . Cystitis   . Fibromyalgia   . Palpitations   . Chronic insomnia   . Pulmonary nodule 12/08    5 mm Anterior RUL  . Hiatal hernia   . Diverticulosis of colon (without mention of hemorrhage)   . Internal hemorrhoid   . Family history of malignant neoplasm of gastrointestinal tract   . Gastritis   . Small bowel obstruction   . Osteonecrosis   . History of gallstones   . Diverticulitis of intestine without perforation or abscess without bleeding     73 year old white female noperforation  . 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 Osteoporosis     History   Social History  . Marital Status: Widowed    Spouse Name: N/A    Number of Children: N/A  . Years of Education: N/A   Occupational History  . Retired     [employer]   Social History Main Topics  . Smoking status: Former Research scientist (life sciences)  . Smokeless tobacco: Never Used     Comment: Quit in 1977  . Alcohol Use: No  . Drug Use: No  . Sexual Activity: No   Other Topics Concern  . Not on file   Social  History Narrative    Past Surgical History  Procedure Laterality Date  . Cholecystectomy    . Pelvic laparoscopy    . Abdominal hysterectomy  1994    TAH,BSO FOR ENDOMETRIOSIS  . Oophorectomy  1994    TAH,BSO  . Total hip arthroplasty  FALL OF 2008    rt. partial hip replacement  . Abdominal surgery  2011    small intestine blockage  . S/p right shoulder rotater cuff  200216/2011    Tear/adhesive capsulitis  . Sbo lap  11    Adhesions and small internal hernia  . Joint replacement  2008  . Gastroplasty  2011    small bowel resection -open  . Total shoulder arthroplasty  06/05/2012    Procedure: TOTAL SHOULDER ARTHROPLASTY;  Surgeon: Johnny Bridge, MD;  Location: [place];  Service: Orthopedics;  Laterality: Right;  RIGHT SHOULDER TOTAL ARTHROPLASTY, HEMIARTHROPLASTY, SHOULDER, FOR ARTHRITIS  . Shoulder hemi-arthroplasty  06/05/2012    Procedure: SHOULDER HEMI-ARTHROPLASTY;  Surgeon: Johnny Bridge, MD;  Location: [place];  Service: Orthopedics;  Laterality: Right;  FOR ARTHRITIS    Family History  Problem Relation Age of Onset  . Heart disease Mother     MI at age 30  . Lymphoma Maternal Grandmother   . Colon cancer Paternal Grandmother   . COPD  Father   . Emphysema Mother   . Thyroid disease Mother     Thyroidectomy/Benign    Allergies  Allergen Reactions  . Celecoxib   . Hydrocodone     Itching--has taken Percocet with no problem   . Prednisone Other (See Comments)    Made pt feel "crazy"  . Sulfonamide Derivatives     Current Outpatient Prescriptions on File Prior to Visit  Medication Sig Dispense Refill  . Calcium Carbonate-Vit D-Min (CALCIUM 1200 PO) Take 1 capsule by mouth daily.    Marland Kitchen co-enzyme Q-10 30 MG capsule Take 30 mg by mouth 3 (three) times daily.    Marland Kitchen esomeprazole (NEXIUM) 40 MG capsule Take 1 capsule (40 mg total) by mouth daily. 30 capsule 3  . gabapentin (NEURONTIN) 300 MG capsule Take 1 capsule (300 mg total) by mouth 2 (two) times daily. 180  capsule 1  . losartan (COZAAR) 50 MG tablet Take 1 tablet (50 mg total) by mouth daily. 90 tablet 1  . rosuvastatin (CRESTOR) 20 MG tablet Take 1 tablet (20 mg total) by mouth daily. 90 tablet 1  . sertraline (ZOLOFT) 100 MG tablet Take 1 tablet (100 mg total) by mouth daily. 90 tablet 3  . zolpidem (AMBIEN) 10 MG tablet Take 1 tablet (10 mg total) by mouth at bedtime as needed. For sleep. 90 tablet 1   No current facility-administered medications on file prior to visit.    BP 122/84 mmHg  Pulse 103  Temp(Src) 99.4 F (37.4 C) (Oral)  Wt 145 lb 12.8 oz (66.134 kg)  SpO2 93%  LMP 01/25/1994chart    Objective:   Physical Exam  Constitutional: She is oriented to person, place, and time. She appears well-developed and well-nourished.  HENT:  Right Ear: External ear normal.  Left Ear: External ear normal.  Nose: Nose normal.  Mouth/Throat: Oropharynx is clear and moist.  Neck: Normal range of motion. Neck supple. No thyromegaly present.  Cardiovascular: Normal rate, regular rhythm and normal heart sounds.   Pulmonary/Chest: Effort normal and breath sounds normal.  Abdominal: Soft. Bowel sounds are normal.  Musculoskeletal: Normal range of motion.  Neurological: She is alert and oriented to person, place, and time.  Skin: Skin is warm and dry.  Psychiatric: She has a normal mood and affect.          Assessment & Plan:  Sandra Craig was seen today for cough.  Diagnoses and associated orders for this visit:  Acute bronchitis, unspecified organism  Cough  Other Orders - methylPREDNISolone (MEDROL DOSEPAK) 4 MG tablet; follow package directions - guaiFENesin-codeine (CHERATUSSIN AC) 100-10 MG/5ML syrup; Take 5 mLs by mouth 3 (three) times daily as needed.    Call the office with any questions or concern. Recheck as scheduled and as needed.

## 2014-07-15 NOTE — Telephone Encounter (Signed)
Telephone call to patient, reviewed overdue for annual, 2015 last year for Prolia, was on reclast 2010, 2011 and Prolia since. Reviewed treatment time is usually 5 years has completed 5 years. Questions answered.

## 2014-07-17 ENCOUNTER — Encounter: Payer: Medicare Other | Admitting: Women's Health

## 2014-07-17 ENCOUNTER — Encounter: Payer: Self-pay | Admitting: Internal Medicine

## 2014-08-06 ENCOUNTER — Ambulatory Visit: Payer: Medicare Other | Admitting: Internal Medicine

## 2014-08-15 ENCOUNTER — Ambulatory Visit: Payer: Medicare Other | Admitting: Internal Medicine

## 2014-08-25 DIAGNOSIS — M545 Low back pain: Secondary | ICD-10-CM | POA: Diagnosis not present

## 2014-08-25 DIAGNOSIS — M5416 Radiculopathy, lumbar region: Secondary | ICD-10-CM | POA: Diagnosis not present

## 2014-08-25 DIAGNOSIS — M4316 Spondylolisthesis, lumbar region: Secondary | ICD-10-CM | POA: Diagnosis not present

## 2014-08-29 ENCOUNTER — Encounter: Payer: Self-pay | Admitting: Internal Medicine

## 2014-08-29 ENCOUNTER — Ambulatory Visit: Payer: Medicare Other | Admitting: Internal Medicine

## 2014-08-29 ENCOUNTER — Ambulatory Visit (INDEPENDENT_AMBULATORY_CARE_PROVIDER_SITE_OTHER): Payer: Medicare Other | Admitting: Internal Medicine

## 2014-08-29 VITALS — BP 110/76 | HR 73 | Temp 98.7°F | Resp 18 | Ht 67.5 in | Wt 143.0 lb

## 2014-08-29 DIAGNOSIS — M545 Low back pain: Secondary | ICD-10-CM | POA: Diagnosis not present

## 2014-08-29 DIAGNOSIS — I10 Essential (primary) hypertension: Secondary | ICD-10-CM

## 2014-08-29 MED ORDER — TRAMADOL HCL 50 MG PO TABS: 50.0000 mg | ORAL_TABLET | Freq: Three times a day (TID) | ORAL | Status: DC | PRN

## 2014-08-29 NOTE — Progress Notes (Signed)
Pre visit review using our clinic review tool, if applicable. No additional management support is needed unless otherwise documented below in the visit note. 

## 2014-08-29 NOTE — Progress Notes (Signed)
Subjective:    Patient ID: Sandra Craig, female    DOB: 1940/10/18, 74 y.o.   MRN: [id number]  HPI  74 year old patient who is seen today for follow-up.  4 days ago, he received her second of 3 planned epidurals for chronic low back pain.  She is requesting a refill on tramadol, which was prescribed by orthopedics which has been of some benefit. She has essential hypertension which has been stable.  Other than her back pain, she seems to doing fairly well She is also concerned about a cyst involving her left mid back area  She has a history of dyslipidemia and remains on Crestor.  She continues to tolerate this well.  Past Medical History  Diagnosis Date  . IBS (irritable bowel syndrome)   . Hypertension   . Hyperlipidemia   . IC (interstitial cystitis)   . Endometriosis   . Polycystic kidney disease   . Osteopenia   . Cystitis   . Fibromyalgia   . Palpitations   . Chronic insomnia   . Pulmonary nodule 12/08    5 mm Anterior RUL  . Hiatal hernia   . Diverticulosis of colon (without mention of hemorrhage)   . Internal hemorrhoid   . Family history of malignant neoplasm of gastrointestinal tract   . Gastritis   . Small bowel obstruction   . Osteonecrosis   . History of gallstones   . Diverticulitis of intestine without perforation or abscess without bleeding     Patient did have abscess but noperforation  . 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 Osteoporosis     History   Social History  . Marital Status: Widowed    Spouse Name: N/A    Number of Children: N/A  . Years of Education: N/A   Occupational History  . Retired     [employer]   Social History Main Topics  . Smoking status: Former Research scientist (life sciences)  . Smokeless tobacco: Never Used     Comment: Quit in 1977  . Alcohol Use: No  . Drug Use: No  . Sexual Activity: No   Other Topics Concern  . Not on file   Social History Narrative    Past Surgical History  Procedure Laterality  Date  . Cholecystectomy    . Pelvic laparoscopy    . Abdominal hysterectomy  1994    TAH,BSO FOR ENDOMETRIOSIS  . Oophorectomy  1994    TAH,BSO  . Total hip arthroplasty  FALL OF 2008    rt. partial hip replacement  . Abdominal surgery  2011    small intestine blockage  . S/p right shoulder rotater cuff  200216/2011    Tear/adhesive capsulitis  . Sbo lap  11    Adhesions and small internal hernia  . Joint replacement  2008  . Gastroplasty  2011    small bowel resection -open  . Total shoulder arthroplasty  06/05/2012    Procedure: TOTAL SHOULDER ARTHROPLASTY;  Surgeon: Johnny Bridge, MD;  Location: [place];  Service: Orthopedics;  Laterality: Right;  RIGHT SHOULDER TOTAL ARTHROPLASTY, HEMIARTHROPLASTY, SHOULDER, FOR ARTHRITIS  . Shoulder hemi-arthroplasty  06/05/2012    Procedure: SHOULDER HEMI-ARTHROPLASTY;  Surgeon: Johnny Bridge, MD;  Location: [place];  Service: Orthopedics;  Laterality: Right;  FOR ARTHRITIS    Family History  Problem Relation Age of Onset  . Heart disease Mother     MI at age 84  . Lymphoma Maternal Grandmother   . Colon cancer Paternal  Grandmother   . COPD Father   . Emphysema Mother   . Thyroid disease Mother     Thyroidectomy/Benign    Allergies  Allergen Reactions  . Celecoxib   . Hydrocodone     Itching--has taken Percocet with no problem   . Prednisone Other (See Comments)    Made pt feel "crazy"  . Sulfonamide Derivatives     Current Outpatient Prescriptions on File Prior to Visit  Medication Sig Dispense Refill  . Calcium Carbonate-Vit D-Min (CALCIUM 1200 PO) Take 1 capsule by mouth daily.    Marland Kitchen esomeprazole (NEXIUM) 40 MG capsule Take 1 capsule (40 mg total) by mouth daily. 30 capsule 3  . gabapentin (NEURONTIN) 300 MG capsule Take 1 capsule (300 mg total) by mouth 2 (two) times daily. 180 capsule 1  . losartan (COZAAR) 50 MG tablet Take 1 tablet (50 mg total) by mouth daily. 90 tablet 1  . rosuvastatin (CRESTOR) 20 MG tablet Take 1  tablet (20 mg total) by mouth daily. 90 tablet 1  . sertraline (ZOLOFT) 100 MG tablet Take 1 tablet (100 mg total) by mouth daily. 90 tablet 3  . zolpidem (AMBIEN) 10 MG tablet Take 1 tablet (10 mg total) by mouth at bedtime as needed. For sleep. 90 tablet 1   No current facility-administered medications on file prior to visit.    BP 110/76 mmHg  Pulse 73  Temp(Src) 98.7 F (37.1 C) (Oral)  Resp 18  Ht 5' 7.5" (1.715 m)  Wt 143 lb (64.864 kg)  BMI 22.05 kg/m2  SpO2 96%  LMP 08/25/1992     Review of Systems  Constitutional: Negative.   HENT: Negative for congestion, dental problem, hearing loss, rhinorrhea, sinus pressure, sore throat and tinnitus.   Eyes: Negative for pain, discharge and visual disturbance.  Respiratory: Negative for cough and shortness of breath.   Cardiovascular: Negative for chest pain, palpitations and leg swelling.  Gastrointestinal: Negative for nausea, vomiting, abdominal pain, diarrhea, constipation, blood in stool and abdominal distention.  Genitourinary: Negative for dysuria, urgency, frequency, hematuria, flank pain, vaginal bleeding, vaginal discharge, difficulty urinating, vaginal pain and pelvic pain.  Musculoskeletal: Positive for back pain. Negative for joint swelling, arthralgias and gait problem.  Skin: Negative for rash.  Neurological: Negative for dizziness, syncope, speech difficulty, weakness, numbness and headaches.  Hematological: Negative for adenopathy.  Psychiatric/Behavioral: Negative for behavioral problems, dysphoric mood and agitation. The patient is not nervous/anxious.        Objective:   Physical Exam  Constitutional: She is oriented to person, place, and time. She appears well-developed and well-nourished.  Blood pressure 110/72  HENT:  Head: Normocephalic.  Right Ear: External ear normal.  Left Ear: External ear normal.  Mouth/Throat: Oropharynx is clear and moist.  Eyes: Conjunctivae and EOM are normal. Pupils are  equal, round, and reactive to light.  Neck: Normal range of motion. Neck supple. No thyromegaly present.  Cardiovascular: Normal rate, regular rhythm, normal heart sounds and intact distal pulses.   Pulmonary/Chest: Effort normal and breath sounds normal.  Approximate 3 cm freely movable subcutaneous nodule involving the left upper mid back area.  No inflammatory changes.  This appeared to be more consistent with a small lipoma  Abdominal: Soft. Bowel sounds are normal. She exhibits no mass. There is no tenderness.  Musculoskeletal: Normal range of motion.  Lymphadenopathy:    She has no cervical adenopathy.  Neurological: She is alert and oriented to person, place, and time.  Skin: Skin is warm and  dry. No rash noted.  Psychiatric: She has a normal mood and affect. Her behavior is normal.          Assessment & Plan:  Hypertension, well-controlled Chronic low back pain.  Tramadol refilled Dyslipidemia.  Continue Crestor  Follow-up PCP 6 months or as needed

## 2014-08-29 NOTE — Patient Instructions (Signed)
Limit your sodium (Salt) intake  Return in 3 months for follow-up

## 2014-09-08 ENCOUNTER — Telehealth: Payer: Self-pay

## 2014-09-08 NOTE — Telephone Encounter (Signed)
Request for ZOLPIDEM 10MG  TABLET #90.  LAST FILLED 06/18/2014 FOR #90  [facility]

## 2014-09-08 NOTE — Telephone Encounter (Signed)
[facility] for #90 with 1 RF

## 2014-09-09 MED ORDER — ZOLPIDEM TARTRATE 10 MG PO TABS: 10.0000 mg | ORAL_TABLET | Freq: Every evening | ORAL | Status: DC | PRN

## 2014-09-09 NOTE — Telephone Encounter (Signed)
rx called in to [facility]

## 2014-10-02 ENCOUNTER — Telehealth: Payer: Self-pay | Admitting: Gastroenterology

## 2014-10-02 MED ORDER — METRONIDAZOLE 250 MG PO TABS: 250.0000 mg | ORAL_TABLET | Freq: Three times a day (TID) | ORAL | Status: DC

## 2014-10-02 MED ORDER — CIPROFLOXACIN HCL 500 MG PO TABS: 500.0000 mg | ORAL_TABLET | Freq: Two times a day (BID) | ORAL | Status: DC

## 2014-10-02 NOTE — Telephone Encounter (Signed)
Thanks Thurmon Fair, can you ,please call her and see if I need to see her on Friday afternoon, I am not sure if all my patients will show up but  Kirk Ruths may not ( Dr Joya Gaskins told her she cannot have surgery for hiatal hernia). Also, I have 2 cancellations for procedures in am so I can start my office earlier. [facility]

## 2014-10-02 NOTE — Telephone Encounter (Signed)
She called tonight.  Has had gradually worsening abdominal pain over past 2-3 days.  Chills but no fevers. Bowels have changed (harder to move).  A lot of cramping, mainly in RLQ. She is not in serious distress but she believes this is similar to symptoms when she presented with complicated diverticulitis 2-3 years ago. I'm calling her in cipro/flagyl prescriptions and she is going to call our office tomorrow to update on her symptoms.

## 2014-10-03 ENCOUNTER — Telehealth: Payer: Self-pay | Admitting: Internal Medicine

## 2014-10-03 ENCOUNTER — Other Ambulatory Visit (INDEPENDENT_AMBULATORY_CARE_PROVIDER_SITE_OTHER): Payer: Medicare Other

## 2014-10-03 ENCOUNTER — Ambulatory Visit (INDEPENDENT_AMBULATORY_CARE_PROVIDER_SITE_OTHER): Payer: Medicare Other | Admitting: Internal Medicine

## 2014-10-03 ENCOUNTER — Encounter: Payer: Self-pay | Admitting: Internal Medicine

## 2014-10-03 VITALS — BP 106/70 | HR 76 | Ht 65.25 in | Wt 145.0 lb

## 2014-10-03 DIAGNOSIS — Z87898 Personal history of other specified conditions: Secondary | ICD-10-CM

## 2014-10-03 DIAGNOSIS — Z8709 Personal history of other diseases of the respiratory system: Secondary | ICD-10-CM | POA: Diagnosis not present

## 2014-10-03 DIAGNOSIS — K5790 Diverticulosis of intestine, part unspecified, without perforation or abscess without bleeding: Secondary | ICD-10-CM | POA: Diagnosis not present

## 2014-10-03 DIAGNOSIS — R918 Other nonspecific abnormal finding of lung field: Secondary | ICD-10-CM

## 2014-10-03 LAB — BUN: BUN: 25 mg/dL — ABNORMAL HIGH (ref 6–23)

## 2014-10-03 LAB — CREATININE, SERUM: Creatinine, Ser: 1.49 mg/dL — ABNORMAL HIGH (ref 0.40–1.20)

## 2014-10-03 NOTE — Progress Notes (Signed)
Sandra Craig 02-23-1941 [id number]  Note: This dictation was prepared with Dragon digital system. Any transcriptional errors that result from this procedure are unintentional.   History of Present Illness: This is a 74 year old white female with history of diverticulitis with intramural abscess in September 2013, seen  today as an acute work in because of 3 week hx of left lower quadrant abdominal pain. And pain and across the suprapubic area. She was visiting her brother who has cancer of the lung  for past 3 weeks at did not have opportunity to go to ED. She called Dr Ardis Hughs  last night when she returned and was started on Cipro and Flagyl., She has not actually started the medication yet. Hx of diverticulitis/ intramural  sigmoid abscess requiring hospitalization in September 2013. Prior colonoscopy in May 2010 confirmed severe diverticulosis She had a right shoulder surgery following the hospitalization and then she was supposed to follow up with Dr.Wakefield for consideration of sigmoid resection but never did. There  is a positive family history of colon cancer in her paternal grandmother.  She had cholecystectomy ,  mild dilatation of the common bile duct on CT scan of the abdomen in February 2015 when she had another attack of abdominal pain, CBD size  thought to be physiologic. She also has 4.5 cm liver cyst  and polycystic kidney disease with chronic renal insufficiency. She has been constipated aching prunes and MiraLAX. He is actually starting to feel better History of pulmonary nodule followed by CT scan of the chest. She is due for follow-up CT scan.. She is concerned because her brother was just diagnosed with lung cancer.    Past Medical History  Diagnosis Date  . IBS (irritable bowel syndrome)   . Hypertension   . Hyperlipidemia   . IC (interstitial cystitis)   . Endometriosis   . Polycystic kidney disease   . Osteopenia   . Cystitis   . Fibromyalgia   . Palpitations   .  Chronic insomnia   . Pulmonary nodule 12/08    5 mm Anterior RUL  . Hiatal hernia   . Diverticulosis of colon (without mention of hemorrhage)   . Internal hemorrhoid   . Family history of malignant neoplasm of gastrointestinal tract   . Gastritis   . Small bowel obstruction   . Osteonecrosis   . History of gallstones   . Diverticulitis of intestine without perforation or abscess without bleeding     Patient did have abscess but noperforation  . 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 Osteoporosis     Past Surgical History  Procedure Laterality Date  . Cholecystectomy    . Pelvic laparoscopy    . Abdominal hysterectomy  1994    TAH,BSO FOR ENDOMETRIOSIS  . Oophorectomy  1994    TAH,BSO  . Total hip arthroplasty  FALL OF 2008    rt. partial hip replacement  . Abdominal surgery  2011    small intestine blockage  . S/p right shoulder rotater cuff  200216/2011    Tear/adhesive capsulitis  . Sbo lap  11    Adhesions and small internal hernia  . Joint replacement  2008  . Gastroplasty  2011    small bowel resection -open  . Total shoulder arthroplasty  06/05/2012    Procedure: TOTAL SHOULDER ARTHROPLASTY;  Surgeon: Johnny Bridge, MD;  Location: [place];  Service: Orthopedics;  Laterality: Right;  RIGHT SHOULDER TOTAL ARTHROPLASTY, HEMIARTHROPLASTY, SHOULDER, FOR ARTHRITIS  . Shoulder hemi-arthroplasty  06/05/2012    Procedure: SHOULDER HEMI-ARTHROPLASTY;  Surgeon: Johnny Bridge, MD;  Location: [place];  Service: Orthopedics;  Laterality: Right;  FOR ARTHRITIS    Allergies  Allergen Reactions  . Celecoxib   . Hydrocodone     Itching--has taken Percocet with no problem   . Prednisone Other (See Comments)    Made pt feel "crazy"  . Sulfonamide Derivatives     Family history and social history have been reviewed.  Review of Systems:   The remainder of the 10 point ROS is negative except as outlined in the H&P  Physical Exam: General Appearance Well  developed, in no distress Eyes  Non icteric  HEENT  Non traumatic, normocephalic  Mouth No lesion, tongue papillated, no cheilosis Neck Supple without adenopathy, thyroid not enlarged, no carotid bruits, no JVD Lungs Clear to auscultation bilaterally COR Normal S1, normal S2, regular rhythm, no murmur, quiet precordium Abdomen active bowl sounds, soft, tender LLQ, no rebound Rectal  Heme negative. Extremities  No pedal edema Skin No lesions Neurological Alert and oriented x 3 Psychological Normal mood and affect  Assessment and Plan:  74 yo WF with third episode of diverticulitis, will start Cipro /Flagyl. Also we need to document it with CT scan of the abdomen. No IV contrast because of polycystic kidney disease. Full liquid diet. Consider sigmoid resection depending on CT scan results.  Pulmonary nodule, needs follow up. Recent diagnosis of lung cancer in patient's brother. Will add chest CT scan to already ordered abdominal CT scan.  Delfin Edis 10/03/2014

## 2014-10-03 NOTE — Telephone Encounter (Signed)
I will see her today at 1.45

## 2014-10-03 NOTE — Telephone Encounter (Signed)
Pt called back and requests to be seen now. Pt scheduled to see Dr. Olevia Perches today at 1:45pm. Pt aware.

## 2014-10-03 NOTE — Patient Instructions (Addendum)
You have been scheduled for a CT scan of the abdomen and pelvis at [facility] ([address]---this is in the same building as [facility]).   You are scheduled on 10-06-14 at 3:00pm. You should arrive 15 minutes prior to your appointment time for registration. Please follow the written instructions below on the day of your exam:  WARNING: IF YOU ARE ALLERGIC TO IODINE/X-RAY DYE, PLEASE NOTIFY RADIOLOGY IMMEDIATELY AT [phone]! YOU WILL BE GIVEN A 13 HOUR PREMEDICATION PREP.  1) Do not eat or drink anything after (4 hours prior to your test) 2) You have been given 2 bottles of oral contrast to drink. The solution may taste               better if refrigerated, but do NOT add ice or any other liquid to this solution. Shake             well before drinking.    Drink 1 bottle of contrast @ 1:00pm (2 hours prior to your exam)  Drink 1 bottle of contrast @ 2:00pm (1 hour prior to your exam)  You may take any medications as prescribed with a small amount of water except for the following: Metformin, Glucophage, Glucovance, Avandamet, Riomet, Fortamet, Actoplus Met, Janumet, Glumetza or Metaglip. The above medications must be held the day of the exam AND 48 hours after the exam.  The purpose of you drinking the oral contrast is to aid in the visualization of your intestinal tract. The contrast solution may cause some diarrhea. Before your exam is started, you will be given a small amount of fluid to drink. Depending on your individual set of symptoms, you may also receive an intravenous injection of x-ray contrast/dye. Plan on being at [facility] for 30 minutes or long, depending on the type of exam you are having performed.  This test typically takes 30-45 minutes to complete.  If you have any questions regarding your exam or if you need to reschedule, you may call the CT department at [phone] between the hours of 8:00 am and 5:00 pm,  Monday-Friday.  ________________________________________________________________________  Your physician has requested that you go to the basement for the following lab work before leaving today: BUN/Creatain  Dr Shawna Orleans

## 2014-10-03 NOTE — Telephone Encounter (Signed)
Called Pt and sch'd her today (10/03/14) for 1:45p.m. per Dr. Olevia Perches.

## 2014-10-03 NOTE — Telephone Encounter (Signed)
Spoke with pt and she states the problem was not what she thought it was last night. States she is feeling much better and she is fine. Pt states she does not need to be seen.

## 2014-10-04 ENCOUNTER — Encounter: Payer: Self-pay | Admitting: Internal Medicine

## 2014-10-06 ENCOUNTER — Ambulatory Visit (INDEPENDENT_AMBULATORY_CARE_PROVIDER_SITE_OTHER)
Admission: RE | Admit: 2014-10-06 | Discharge: 2014-10-06 | Disposition: A | Discharge status: Home / Self Care | Payer: Medicare Other | Source: Ambulatory Visit | Attending: Internal Medicine | Admitting: Internal Medicine

## 2014-10-06 ENCOUNTER — Telehealth: Payer: Self-pay | Admitting: Internal Medicine

## 2014-10-06 DIAGNOSIS — K5790 Diverticulosis of intestine, part unspecified, without perforation or abscess without bleeding: Secondary | ICD-10-CM | POA: Diagnosis not present

## 2014-10-06 DIAGNOSIS — R1032 Left lower quadrant pain: Secondary | ICD-10-CM | POA: Diagnosis not present

## 2014-10-06 DIAGNOSIS — R918 Other nonspecific abnormal finding of lung field: Secondary | ICD-10-CM

## 2014-10-06 DIAGNOSIS — J9811 Atelectasis: Secondary | ICD-10-CM | POA: Diagnosis not present

## 2014-10-06 DIAGNOSIS — R911 Solitary pulmonary nodule: Secondary | ICD-10-CM | POA: Diagnosis not present

## 2014-10-06 NOTE — Telephone Encounter (Signed)
Spoke with patient and reassured her that CT can see her results. Told her MD has not reviewed them yet.

## 2014-10-07 ENCOUNTER — Telehealth: Payer: Self-pay | Admitting: Internal Medicine

## 2014-10-07 DIAGNOSIS — R918 Other nonspecific abnormal finding of lung field: Secondary | ICD-10-CM

## 2014-10-07 NOTE — Telephone Encounter (Signed)
Pt had chest xray yesterday, to fu on previous lung nodule.  The nodule that has been there has grown and now there are several more even larger ones. Pt would like you to review these findings in her chart.  Pt would like to know if you think she she go straight to her pulmonologist, dr Joya Gaskins, or fu w/ you first on this issue.  pls advise.

## 2014-10-08 NOTE — Telephone Encounter (Signed)
Inform pt I reviewed CT of Chest w/o contrast.  Radiologist suggests new nodules may be infectious in etiology. I agree with proceeding with pulmonary referral - Dr. Joya Gaskins.

## 2014-10-09 ENCOUNTER — Encounter: Payer: Self-pay | Admitting: Critical Care Medicine

## 2014-10-09 ENCOUNTER — Ambulatory Visit (INDEPENDENT_AMBULATORY_CARE_PROVIDER_SITE_OTHER): Payer: Medicare Other | Admitting: Critical Care Medicine

## 2014-10-09 VITALS — BP 100/64 | HR 90 | Temp 97.0°F | Ht 65.5 in | Wt 143.0 lb

## 2014-10-09 DIAGNOSIS — R918 Other nonspecific abnormal finding of lung field: Secondary | ICD-10-CM | POA: Diagnosis not present

## 2014-10-09 NOTE — Patient Instructions (Signed)
Follow up CT Chest in 3 months in [place] No change in medications

## 2014-10-09 NOTE — Telephone Encounter (Signed)
Pt aware.

## 2014-10-10 DIAGNOSIS — A310 Pulmonary mycobacterial infection: Secondary | ICD-10-CM | POA: Insufficient documentation

## 2014-10-10 NOTE — Assessment & Plan Note (Signed)
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

## 2014-10-10 NOTE — Progress Notes (Signed)
Subjective:    Patient ID: Sandra Craig, female    DOB: [birth year], 74 y.o.   MRN: [id number]  HPI 10/10/2014 Chief Complaint  Patient presents with  . Follow-up    review CT results  This patient was last seen for the first and only time in July 2013 for evaluation of right upper lobe nodules. At that time the feeling was these were benign in the consequence. a follow-up ct scan in november 2013 showed no change. the patient then did not require further follow-up. another ct scan of the abdomen was done recently because of diverticular disease in this showed concern for changes in the chest therefore complete ct scan of the chest was recently done revealing some progression right upper lobe nodules with tree-in-bud pattern  Note this patient has had no symptom complex at this time. There is no cough fever chills sweats shortness of breath hemoptysis mucous production or any other respiratory tract symptoms. All of her recent symptoms have been related to her abdominal process with diverticulosis and diverticulitis Pt denies any significant sore throat, nasal congestion or excess secretions, fever, chills, sweats, unintended weight loss, pleurtic or exertional chest pain, orthopnea PND, or leg swelling Pt denies any increase in rescue therapy over baseline, denies waking up needing it or having any early am or nocturnal exacerbations of coughing/wheezing/or dyspnea. Pt also denies any obvious fluctuation in symptoms with  weather or environmental change or other alleviating or aggravating factors      Review of Systems Constitutional:   No  weight loss, night sweats,  Fevers, chills, fatigue, lassitude. HEENT:   No headaches,  Difficulty swallowing,  Tooth/dental problems,  Sore throat,                No sneezing, itching, ear ache, nasal congestion, post nasal drip,   CV:  No chest pain,  Orthopnea, PND, swelling in lower extremities, anasarca, dizziness, palpitations  GI  No  heartburn, indigestion, ++abdominal pain, nausea, vomiting, diarrhea, change in bowel habits, loss of appetite  Resp: No shortness of breath with exertion or at rest.  No excess mucus, no productive cough,  No non-productive cough,  No coughing up of blood.  No change in color of mucus.  No wheezing.  No chest wall deformity  Skin: no rash or lesions.  GU: no dysuria, change in color of urine, no urgency or frequency.  No flank pain.  MS:  No joint pain or swelling.  No decreased range of motion.  No back pain.  Psych:  No change in mood or affect. No depression or anxiety.  No memory loss.     Objective:   Physical Exam Filed Vitals:   10/09/14 1211 10/09/14 1214  BP:  100/64  Pulse:  90  Temp:  97 F (36.1 C)  TempSrc:  Oral  Height:  5' 5.5" (1.664 m)  Weight: 143 lb (64.864 kg)   SpO2:  94%    Gen: Pleasant, well-nourished, in no distress,  normal affect  ENT: No lesions,  mouth clear,  oropharynx clear, no postnasal drip  Neck: No JVD, no TMG, no carotid bruits  Lungs: No use of accessory muscles, no dullness to percussion, clear without rales or rhonchi  Cardiovascular: RRR, heart sounds normal, no murmur or gallops, no peripheral edema  Abdomen: soft and NT, no HSM,  BS normal  Musculoskeletal: No deformities, no cyanosis or clubbing  Neuro: alert, non focal  Skin: Warm, no lesions or rashes  No  results found.  CT scan of chest is reviewed and is compared to prior CT scans of the chest in 2013 Lungs/Pleura: Tree in bud opacity with some minimal bronchiectasis in the central right upper lobe has progressed slightly in the interval. There is a new 6 mm nodule in the superior segment of the right lower lobe, immediately posterior the major fissure. There is some tree in bud opacity just peripheral to this 6 mm nodule. Trace progression of a cluster of tiny nodules in the left upper lobe (image 19 series 3). Compressive atelectasis is seen in the dependent  lower lobes bilaterally. No pulmonary edema. No pleural effusion.       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 Calcium Carbonate-Vit D-Min (CALCIUM 1200 PO) Take 1 capsule by mouth daily.  . ciprofloxacin (CIPRO) 500 MG tablet Take 1 tablet (500 mg total) by mouth 2 (two) times daily.  Marland Kitchen esomeprazole (NEXIUM) 40 MG capsule Take 1 capsule (40 mg total) by mouth daily.  Marland Kitchen gabapentin (NEURONTIN) 300 MG capsule Take 1 capsule (300 mg total) by mouth 2 (two) times daily.  Marland Kitchen losartan (COZAAR) 50 MG tablet Take 1 tablet (50 mg total) by mouth daily.  . metroNIDAZOLE (FLAGYL) 250 MG tablet Take 1 tablet (250 mg total) by mouth 3 (three) times daily.  . rosuvastatin (CRESTOR) 20 MG tablet Take 1 tablet (20 mg total) by mouth daily.  . sertraline (ZOLOFT) 100 MG tablet Take 1 tablet (100 mg total) by mouth daily.  Marland Kitchen zolpidem (AMBIEN) 10 MG tablet Take 1 tablet (10 mg total) by mouth at bedtime as needed. For sleep.  . [DISCONTINUED] traMADol (ULTRAM) 50 MG tablet Take 1 tablet (50 mg total) by mouth every 8 (eight) hours as needed. (Patient not taking: Reported on 10/09/2014)

## 2014-10-13 ENCOUNTER — Ambulatory Visit: Payer: Medicare Other | Admitting: Critical Care Medicine

## 2014-11-26 ENCOUNTER — Other Ambulatory Visit: Payer: Self-pay | Admitting: Internal Medicine

## 2014-12-01 ENCOUNTER — Telehealth: Payer: Self-pay | Admitting: Critical Care Medicine

## 2014-12-01 NOTE — Telephone Encounter (Signed)
Please advise PCC;s thanks 

## 2014-12-04 DIAGNOSIS — M5416 Radiculopathy, lumbar region: Secondary | ICD-10-CM | POA: Diagnosis not present

## 2014-12-04 DIAGNOSIS — M4310 Spondylolisthesis, site unspecified: Secondary | ICD-10-CM | POA: Diagnosis not present

## 2014-12-04 NOTE — Telephone Encounter (Signed)
Cancelled appt in [place] ans rescheduled for [facility]care office on [address] 01/12/15'@1am'$  pt is aware Sandra Craig

## 2014-12-24 ENCOUNTER — Ambulatory Visit: Payer: Medicare Other | Admitting: Critical Care Medicine

## 2014-12-27 ENCOUNTER — Ambulatory Visit: Payer: Medicare Other | Admitting: Family Medicine

## 2014-12-27 ENCOUNTER — Encounter: Payer: Self-pay | Admitting: Family Medicine

## 2014-12-27 ENCOUNTER — Ambulatory Visit (INDEPENDENT_AMBULATORY_CARE_PROVIDER_SITE_OTHER): Payer: Medicare Other | Admitting: Family Medicine

## 2014-12-27 VITALS — BP 148/90 | Temp 98.3°F | Wt 143.0 lb

## 2014-12-27 DIAGNOSIS — J0100 Acute maxillary sinusitis, unspecified: Secondary | ICD-10-CM

## 2014-12-27 DIAGNOSIS — J019 Acute sinusitis, unspecified: Secondary | ICD-10-CM | POA: Insufficient documentation

## 2014-12-27 MED ORDER — AMOXICILLIN-POT CLAVULANATE 875-125 MG PO TABS: 1.0000 | ORAL_TABLET | Freq: Two times a day (BID) | ORAL | Status: DC

## 2014-12-27 NOTE — Assessment & Plan Note (Signed)
Likely, nontoxic.  Prev HA from epidural resolved and likely incidental.  F/u prn.  Start augmentin today.  Rest and fluids o/w.  She agrees.

## 2014-12-27 NOTE — Progress Notes (Signed)
She had epidural done 12/11/14.  She had HA after that but got better with caffeine.  Then her sx changed likely from a separate issue, worse in the meantime, over the last >1 week.  Facial HA, bloody rhinorrhea.  No fevers.  R>L ear pain.  No cough.  Some throat clearing.  No wheeze.    Back pain is better now in the meantime.    Meds, vitals, and allergies reviewed.   ROS: See HPI.  Otherwise, noncontributory.  GEN: nad, alert and oriented HEENT: mucous membranes moist, tm w/o erythema, nasal exam w/o erythema but adherent small clot noted in L nostril, clear discharge noted,  OP with cobblestoning, max sinuses ttp B NECK: supple w/o LA CV: rrr.   PULM: ctab, no inc wob EXT: no edema

## 2014-12-27 NOTE — Patient Instructions (Signed)
Gently blow your nose.  You may have more nose bleeds.   Start augmentin today.  Take care.  Glad to see you.

## 2014-12-30 ENCOUNTER — Ambulatory Visit: Payer: Medicare Other | Admitting: Family Medicine

## 2015-01-02 DIAGNOSIS — M545 Low back pain: Secondary | ICD-10-CM | POA: Diagnosis not present

## 2015-01-02 DIAGNOSIS — M4310 Spondylolisthesis, site unspecified: Secondary | ICD-10-CM | POA: Diagnosis not present

## 2015-01-02 DIAGNOSIS — R030 Elevated blood-pressure reading, without diagnosis of hypertension: Secondary | ICD-10-CM | POA: Diagnosis not present

## 2015-01-02 DIAGNOSIS — M5416 Radiculopathy, lumbar region: Secondary | ICD-10-CM | POA: Diagnosis not present

## 2015-01-05 DIAGNOSIS — M5416 Radiculopathy, lumbar region: Secondary | ICD-10-CM | POA: Diagnosis not present

## 2015-01-05 DIAGNOSIS — M545 Low back pain: Secondary | ICD-10-CM | POA: Diagnosis not present

## 2015-01-08 ENCOUNTER — Ambulatory Visit (INDEPENDENT_AMBULATORY_CARE_PROVIDER_SITE_OTHER)
Admission: RE | Admit: 2015-01-08 | Discharge: 2015-01-08 | Disposition: A | Discharge status: Home / Self Care | Payer: Medicare Other | Source: Ambulatory Visit | Attending: Critical Care Medicine | Admitting: Critical Care Medicine

## 2015-01-08 DIAGNOSIS — J9811 Atelectasis: Secondary | ICD-10-CM | POA: Diagnosis not present

## 2015-01-08 DIAGNOSIS — R918 Other nonspecific abnormal finding of lung field: Secondary | ICD-10-CM | POA: Diagnosis not present

## 2015-01-08 DIAGNOSIS — J479 Bronchiectasis, uncomplicated: Secondary | ICD-10-CM | POA: Diagnosis not present

## 2015-01-09 ENCOUNTER — Telehealth: Payer: Self-pay | Admitting: Critical Care Medicine

## 2015-01-09 NOTE — Progress Notes (Signed)
Quick Note:  Spoke with pt. Discussed results and recs per Dr. Joya Gaskins. She verbalized understanding.  Pt states symptoms are unchanged from last OV in March 2016. She still has nonprod cough, hoarseness, DOE, and a "smoothering feeling" mostly qhs. Pt aware I will send msg to PW as FYI regarding symptoms and will call her back if any changes are needed. She verbalized understanding. ______

## 2015-01-09 NOTE — Progress Notes (Signed)
Quick Note:  Notify the patient that the Xray is stable and no pneumonia Ask her if she is having any symptoms, areas of nodules/inflammation are stable No change in medications are recommended. Continue current meds as prescribed at last office visit ______

## 2015-01-09 NOTE — Telephone Encounter (Signed)
Spoke with pt. She had already spoken with Crystal. Nothing further needed at this time.

## 2015-01-12 ENCOUNTER — Inpatient Hospital Stay: Admission: RE | Admit: 2015-01-12 | Payer: Medicare Other | Source: Ambulatory Visit

## 2015-01-12 ENCOUNTER — Other Ambulatory Visit (HOSPITAL_BASED_OUTPATIENT_CLINIC_OR_DEPARTMENT_OTHER): Payer: Medicare Other

## 2015-01-12 NOTE — Progress Notes (Signed)
Quick Note:  Spoke with Sandra Craig . Offered appt tomorrow morning with PW - Sandra Craig unable to make appt for tomorrow. Offered appt later in the week with another provider. Sandra Craig requesting to see Dr. Joya Gaskins. Scheduled appt to see PW on June 22 at 8:45 am . Sandra Craig confirmed appt. She is to call office back if symptoms worsen in the meantime. Sandra Craig in agreement with plan and voiced no further questions or concerns at this time. ______

## 2015-01-15 DIAGNOSIS — M5416 Radiculopathy, lumbar region: Secondary | ICD-10-CM | POA: Diagnosis not present

## 2015-01-15 DIAGNOSIS — M545 Low back pain: Secondary | ICD-10-CM | POA: Diagnosis not present

## 2015-01-21 ENCOUNTER — Encounter: Payer: Self-pay | Admitting: Gastroenterology

## 2015-01-21 ENCOUNTER — Ambulatory Visit (INDEPENDENT_AMBULATORY_CARE_PROVIDER_SITE_OTHER): Payer: Medicare Other | Admitting: Critical Care Medicine

## 2015-01-21 ENCOUNTER — Encounter: Payer: Self-pay | Admitting: Critical Care Medicine

## 2015-01-21 VITALS — BP 122/80 | HR 89 | Temp 98.3°F | Ht 65.0 in | Wt 142.8 lb

## 2015-01-21 DIAGNOSIS — R918 Other nonspecific abnormal finding of lung field: Secondary | ICD-10-CM | POA: Diagnosis not present

## 2015-01-21 NOTE — Patient Instructions (Signed)
A bronchoscopy will be obtained with Dr Lamonte Sakai. No medication changes

## 2015-01-21 NOTE — Progress Notes (Signed)
Subjective:    Patient ID: Sandra Craig, female    DOB: October 29, 1940, 74 y.o.   MRN: [id number]  HPI 01/21/2015 Chief Complaint  Patient presents with  . Follow-up    Last OV 10/09/14. Pt c/o increased SOB with exertion and lying down at night, and a non-productive cough over the past year.     Pt noting more symptoms, diff coughing and cant get up mucus.  No real chest pain  Pain with walking and dyspnea and chest hurts.  No fever.  Notes sweats at night, not drenching.  Pt is noting more dyspnea.  CT Chest repeated with progression noted.   Pt denies any significant sore throat, nasal congestion or excess secretions, fever, chills, unintended weight loss, pleurtic or exertional chest pain, orthopnea PND, or leg swelling Pt denies any increase in rescue therapy over baseline, denies waking up needing it or having any early am or nocturnal exacerbations of coughing/wheezing/or dyspnea. Pt also denies any obvious fluctuation in symptoms with  weather or environmental change or other alleviating or aggravating factors   Current Medications, Allergies, Complete Past Medical History, Past Surgical History, Family History, and Social History were reviewed in [facility] record per todays encounter:  01/21/2015   Review of Systems  Constitutional: Negative.   HENT: Negative.  Negative for ear pain, postnasal drip, rhinorrhea, sinus pressure, sore throat, trouble swallowing and voice change.   Eyes: Negative.   Respiratory: Positive for cough, chest tightness, shortness of breath and wheezing. Negative for apnea, choking and stridor.   Cardiovascular: Negative.  Negative for chest pain, palpitations and leg swelling.  Gastrointestinal: Negative.  Negative for nausea, vomiting, abdominal pain and abdominal distention.  Genitourinary: Negative.   Musculoskeletal: Negative.  Negative for myalgias and arthralgias.  Skin: Negative.  Negative for rash.    Allergic/Immunologic: Negative.  Negative for environmental allergies and food allergies.  Neurological: Negative.  Negative for dizziness, syncope, weakness and headaches.  Hematological: Negative.  Negative for adenopathy. Does not bruise/bleed easily.  Psychiatric/Behavioral: Negative.  Negative for sleep disturbance and agitation. The patient is not nervous/anxious.        Objective:   Physical Exam Filed Vitals:   01/21/15 0858  BP: 122/80  Pulse: 89  Temp: 98.3 F (36.8 C)  TempSrc: Oral  Height: '5\' 5"'$  (1.651 m)  Weight: 142 lb 12.8 oz (64.774 kg)  SpO2: 97%    Gen: Pleasant, well-nourished, in no distress,  normal affect  ENT: No lesions,  mouth clear,  oropharynx clear, no postnasal drip  Neck: No JVD, no TMG, no carotid bruits  Lungs: No use of accessory muscles, no dullness to percussion, clear without rales or rhonchi  Cardiovascular: RRR, heart sounds normal, no murmur or gallops, no peripheral edema  Abdomen: soft and NT, no HSM,  BS normal  Musculoskeletal: No deformities, no cyanosis or clubbing  Neuro: alert, non focal  Skin: Warm, no lesions or rashes  No results found. Recent CT of chest personally reviewed, progression in RuL nodules /infiltrate noted       Assessment & Plan:  I personally reviewed all images and lab data in the [facility] system as well as any outside material available during this office visit and agree with the  radiology impressions.   Multiple nodules of lung Progressive tree in bud nodular perivascular airway infiltrate with associated progression compared to 09/2014 study.  Cannot rule out bronchoalveolar carcinoma but favor mycobacterial granulomatous lung disease As I no longer am doing  bronchoscopy, I have asked my partner Londell Moh to do fluoro guided Fob with RUL focus on 01/28/15.    Plan FOB 01/28/15.  Pt informed and agreeable to this plan    Sandra Craig was seen today for follow-up.  Diagnoses and all orders for this  visit:  Multiple nodules of lung    I had an extended discussion with the patient and or family lasting 10 minutes of a 25 minute visit including:  Diagnostic options, need for Fob . I answered all the pts ?s and told her about FOB

## 2015-01-22 DIAGNOSIS — M546 Pain in thoracic spine: Secondary | ICD-10-CM | POA: Diagnosis not present

## 2015-01-22 DIAGNOSIS — M545 Low back pain: Secondary | ICD-10-CM | POA: Diagnosis not present

## 2015-01-22 NOTE — Assessment & Plan Note (Signed)
Progressive tree in bud nodular perivascular airway infiltrate with associated progression compared to 09/2014 study.  Cannot rule out bronchoalveolar carcinoma but favor mycobacterial granulomatous lung disease As I no longer am doing bronchoscopy, I have asked my partner Londell Moh to do fluoro guided Fob with RUL focus on 01/28/15.    Plan FOB 01/28/15.  Pt informed and agreeable to this plan

## 2015-01-27 ENCOUNTER — Encounter (HOSPITAL_COMMUNITY): Payer: Self-pay

## 2015-01-28 ENCOUNTER — Ambulatory Visit (HOSPITAL_COMMUNITY): Payer: Medicare Other

## 2015-01-28 ENCOUNTER — Ambulatory Visit (HOSPITAL_COMMUNITY)
Admission: RE | Admit: 2015-01-28 | Discharge: 2015-01-28 | Disposition: A | Discharge status: Home / Self Care | Payer: Medicare Other | Source: Ambulatory Visit | Attending: Emergency Medicine | Admitting: Emergency Medicine

## 2015-01-28 ENCOUNTER — Encounter (HOSPITAL_COMMUNITY)
Admission: RE | Disposition: A | Discharge status: Home / Self Care | Payer: Self-pay | Source: Ambulatory Visit | Attending: Emergency Medicine

## 2015-01-28 ENCOUNTER — Encounter (HOSPITAL_COMMUNITY): Payer: Self-pay | Admitting: Respiratory Therapy

## 2015-01-28 DIAGNOSIS — R918 Other nonspecific abnormal finding of lung field: Secondary | ICD-10-CM | POA: Diagnosis not present

## 2015-01-28 DIAGNOSIS — Z9889 Other specified postprocedural states: Secondary | ICD-10-CM | POA: Diagnosis not present

## 2015-01-28 DIAGNOSIS — D1431 Benign neoplasm of right bronchus and lung: Secondary | ICD-10-CM | POA: Insufficient documentation

## 2015-01-28 DIAGNOSIS — J9811 Atelectasis: Secondary | ICD-10-CM | POA: Diagnosis not present

## 2015-01-28 HISTORY — PX: VIDEO BRONCHOSCOPY: SHX5072

## 2015-01-28 LAB — BODY FLUID CELL COUNT WITH DIFFERENTIAL
Eos, Fluid: 4 %
Lymphs, Fluid: 48 %
Monocyte-Macrophage-Serous Fluid: 4 % — ABNORMAL LOW (ref 50–90)
Neutrophil Count, Fluid: 44 % — ABNORMAL HIGH (ref 0–25)
Total Nucleated Cell Count, Fluid: 233 cu mm (ref 0–1000)

## 2015-01-28 SURGERY — BRONCHOSCOPY, WITH FLUOROSCOPY
Anesthesia: Moderate Sedation | Laterality: Bilateral

## 2015-01-28 MED ORDER — FENTANYL CITRATE (PF) 100 MCG/2ML IJ SOLN
INTRAMUSCULAR | Status: DC | PRN
  Administered 2015-01-28 (×2): 50 ug via INTRAVENOUS

## 2015-01-28 MED ORDER — MIDAZOLAM HCL 5 MG/ML IJ SOLN
INTRAMUSCULAR | Status: AC
  Filled 2015-01-28: qty 2

## 2015-01-28 MED ORDER — MIDAZOLAM HCL 10 MG/2ML IJ SOLN
INTRAMUSCULAR | Status: DC | PRN
  Administered 2015-01-28: 1 mg via INTRAVENOUS
  Administered 2015-01-28: 2 mg via INTRAVENOUS
  Administered 2015-01-28: 3 mg via INTRAVENOUS

## 2015-01-28 MED ORDER — PHENYLEPHRINE HCL 0.25 % NA SOLN
NASAL | Status: DC | PRN
  Administered 2015-01-28: 2 via NASAL

## 2015-01-28 MED ORDER — FENTANYL CITRATE (PF) 100 MCG/2ML IJ SOLN
INTRAMUSCULAR | Status: AC
  Filled 2015-01-28: qty 4

## 2015-01-28 MED ORDER — LIDOCAINE HCL 2 % EX GEL: 1.0000 "application " | Freq: Once | CUTANEOUS | Status: DC

## 2015-01-28 MED ORDER — LIDOCAINE HCL 2 % EX GEL
CUTANEOUS | Status: DC | PRN
  Administered 2015-01-28: 1

## 2015-01-28 MED ORDER — SODIUM CHLORIDE 0.9 % IV SOLN
INTRAVENOUS | Status: DC
  Administered 2015-01-28: 11:00:00 via INTRAVENOUS

## 2015-01-28 MED ORDER — LIDOCAINE HCL (PF) 1 % IJ SOLN
INTRAMUSCULAR | Status: DC | PRN
  Administered 2015-01-28: 6 mL

## 2015-01-28 MED ORDER — PHENYLEPHRINE HCL 0.25 % NA SOLN: 1.0000 | Freq: Four times a day (QID) | NASAL | Status: DC | PRN

## 2015-01-28 NOTE — Progress Notes (Signed)
Video Bronchoscopy performed  Intervention Bronchial Brushing, Biopsy and Washing  Patieint Tolerated Well

## 2015-01-28 NOTE — Discharge Instructions (Signed)
Flexible Bronchoscopy, Care After These instructions give you information on caring for yourself after your procedure. Your doctor may also give you more specific instructions. Call your doctor if you have any problems or questions after your procedure. HOME CARE  Do not eat or drink anything for 2 hours after your procedure. If you try to eat or drink before the medicine wears off, food or drink could go into your lungs. You could also burn yourself.  After 2 hours have passed and when you can cough and gag normally, you may eat soft food and drink liquids slowly.  The day after the test, you may eat your normal diet.  You may do your normal activities.  Keep all doctor visits. GET HELP RIGHT AWAY IF:  You get more and more short of breath.  You get light-headed.  You feel like you are going to pass out (faint).  You have chest pain.  You have new problems that worry you.  You cough up more than a little blood.  You cough up more blood than before. MAKE SURE YOU:  Understand these instructions.  Will watch your condition.  Will get help right away if you are not doing well or get worse. Document Released: 05/15/2009 Document Revised: 07/23/2013 Document Reviewed: 03/22/2013 [facility] Patient Information 2015 [place], [place]. This information is not intended to replace advice given to you by your health care provider. Make sure you discuss any questions you have with your health care provider.    NO EATING OR DRINKING UNTIL  3:00pm    01/28/2015  Please call our office for any questions or concerns.  [phone]  Follow with Dr Joya Gaskins as planned.

## 2015-01-28 NOTE — H&P (View-Only) (Signed)
Subjective:    Patient ID: Sandra Craig, female    DOB: Feb 12, 1941, 74 y.o.   MRN: [id number]  HPI 01/21/2015 Chief Complaint  Patient presents with  . Follow-up    Last OV 10/09/14. Pt c/o increased SOB with exertion and lying down at night, and a non-productive cough over the past year.     Pt noting more symptoms, diff coughing and cant get up mucus.  No real chest pain  Pain with walking and dyspnea and chest hurts.  No fever.  Notes sweats at night, not drenching.  Pt is noting more dyspnea.  CT Chest repeated with progression noted.   Pt denies any significant sore throat, nasal congestion or excess secretions, fever, chills, unintended weight loss, pleurtic or exertional chest pain, orthopnea PND, or leg swelling Pt denies any increase in rescue therapy over baseline, denies waking up needing it or having any early am or nocturnal exacerbations of coughing/wheezing/or dyspnea. Pt also denies any obvious fluctuation in symptoms with  weather or environmental change or other alleviating or aggravating factors   Current Medications, Allergies, Complete Past Medical History, Past Surgical History, Family History, and Social History were reviewed in [facility] record per todays encounter:  01/21/2015   Review of Systems  Constitutional: Negative.   HENT: Negative.  Negative for ear pain, postnasal drip, rhinorrhea, sinus pressure, sore throat, trouble swallowing and voice change.   Eyes: Negative.   Respiratory: Positive for cough, chest tightness, shortness of breath and wheezing. Negative for apnea, choking and stridor.   Cardiovascular: Negative.  Negative for chest pain, palpitations and leg swelling.  Gastrointestinal: Negative.  Negative for nausea, vomiting, abdominal pain and abdominal distention.  Genitourinary: Negative.   Musculoskeletal: Negative.  Negative for myalgias and arthralgias.  Skin: Negative.  Negative for rash.    Allergic/Immunologic: Negative.  Negative for environmental allergies and food allergies.  Neurological: Negative.  Negative for dizziness, syncope, weakness and headaches.  Hematological: Negative.  Negative for adenopathy. Does not bruise/bleed easily.  Psychiatric/Behavioral: Negative.  Negative for sleep disturbance and agitation. The patient is not nervous/anxious.        Objective:   Physical Exam Filed Vitals:   01/21/15 0858  BP: 122/80  Pulse: 89  Temp: 98.3 F (36.8 C)  TempSrc: Oral  Height: '5\' 5"'$  (1.651 m)  Weight: 142 lb 12.8 oz (64.774 kg)  SpO2: 97%    Gen: Pleasant, well-nourished, in no distress,  normal affect  ENT: No lesions,  mouth clear,  oropharynx clear, no postnasal drip  Neck: No JVD, no TMG, no carotid bruits  Lungs: No use of accessory muscles, no dullness to percussion, clear without rales or rhonchi  Cardiovascular: RRR, heart sounds normal, no murmur or gallops, no peripheral edema  Abdomen: soft and NT, no HSM,  BS normal  Musculoskeletal: No deformities, no cyanosis or clubbing  Neuro: alert, non focal  Skin: Warm, no lesions or rashes  No results found. Recent CT of chest personally reviewed, progression in RuL nodules /infiltrate noted       Assessment & Plan:  I personally reviewed all images and lab data in the [facility] system as well as any outside material available during this office visit and agree with the  radiology impressions.   Multiple nodules of lung Progressive tree in bud nodular perivascular airway infiltrate with associated progression compared to 09/2014 study.  Cannot rule out bronchoalveolar carcinoma but favor mycobacterial granulomatous lung disease As I no longer am doing  bronchoscopy, I have asked my partner Sandra Craig to do fluoro guided Fob with RUL focus on 01/28/15.    Plan FOB 01/28/15.  Pt informed and agreeable to this plan    Sandra Craig was seen today for follow-up.  Diagnoses and all orders for this  visit:  Multiple nodules of lung    I had an extended discussion with the patient and or family lasting 10 minutes of a 25 minute visit including:  Diagnostic options, need for Fob . I answered all the pts ?s and told her about FOB

## 2015-01-28 NOTE — Interval H&P Note (Signed)
PCCM Interval Note  Pt presents today for further eval of micronodular disease on CT scan chest.  She has no new issues or complaints today.   Filed Vitals:   01/28/15 1145 01/28/15 1150 01/28/15 1152 01/28/15 1155  BP: 130/71 137/53 137/53   Pulse: 71 71 71 72  Temp:      TempSrc:      Resp: '14 10 13 17  '$ Height:      Weight:      SpO2: 96% 96% 96% 100%   Plans:  FOB with RUL apical posterior segmental brushings, washings, possibly bx's Pt understands the procedure and elects to proceed  Baltazar Apo, MD, PhD 01/28/2015, 12:26 PM [facility] [phone] or if no answer [phone]

## 2015-01-28 NOTE — Op Note (Signed)
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  Estimated Blood Loss:  285BM Complications: none noted  Indications and History: JANGELYN OSTERBERGis 74y.o. with history of micronodular peribronchovascular infiltrate on CT chest, most notable in the right upper lobe.  Recommendation was to perform video fiberoptic bronchoscopy with BAL, biopsies. The risks, benefits, complications, treatment options and expected outcomes were discussed with the patient.  The possibilities of pneumothorax, pneumonia, reaction to medication, pulmonary aspiration, perforation of a viscus, bleeding, failure to diagnose a condition and creating a complication requiring transfusion or operation were discussed with the patient who freely signed the consent.    Description of Procedure: The patient was seen in the Preoperative Area, was examined and was deemed appropriate to proceed.  [facility]ileand the procedure verified as Flexible Video Fiberoptic Bronchoscopy.  A Time Out was held and the above information confirmed.   Conscious sedation was initiated as indicated above. The video fiberoptic bronchoscope was introduced via the right nare and a general inspection was performed which showed normal cords, normal trachea, normal main carina. The R sided airways were inspected and showed normal RUL, BI, RML and RLL. The L side was then inspected. The LLL, Lingular and LUL airways were normal.  There were no endobronchial lesions or abnormal secretions.   Under fluoroscopic guidance transbronchial brushings and  transbronchial biopsies were performed in the posterior segment of the right upper lobe. There was some initial moderate bleeding that stopped quickly. Finally bronchoalveolar lavage was performed in the right upper lobe to be sent for cytology. The patient tolerated the procedure well. The bronchoscope was removed. There were no obvious complications.   Samples: 1. Transbronchial brushings from right upper lobe 2. Transbronchial forceps biopsies from right upper lobe 3. Bronchioalveolar lavage from the right upper lobe  Plans:  We will review the cytology, pathology and microbiology results with the patient when they become available.  Outpatient followup will be with Dr WJoya Gaskins    RBaltazar Apo MD, PhD 01/28/2015, 1:03 PM [facility] [phone] if no answer [phone]

## 2015-01-29 ENCOUNTER — Telehealth: Payer: Self-pay | Admitting: *Deleted

## 2015-01-29 ENCOUNTER — Encounter (HOSPITAL_COMMUNITY): Payer: Self-pay | Admitting: Emergency Medicine

## 2015-01-29 NOTE — Telephone Encounter (Signed)
Transition Care Management Follow-up Telephone Call  How have you been since you were released from the hospital? Fine but has a cough   Do you understand why you were in the hospital? yes   Do you understand the discharge instrcutions? yes, given to sister  Items Reviewed:  Medications reviewed: yes  Allergies reviewed: yes  Dietary changes reviewed: yes  Referrals reviewed: yes   Functional Questionnaire:   Activities of Daily Living (ADLs):   She states they are independent in the following: ambulation, bathing and hygiene and feeding States they require assistance with the following: none   Any transportation issues/concerns?: no   Any patient concerns? Yes - back and left hip pain   Confirmed importance and date/time of follow-up visits scheduled: yes   Confirmed with patient if condition begins to worsen call PCP or go to the ER.  Patient was given the Call-a-Nurse line [phone]: yes

## 2015-01-30 ENCOUNTER — Telehealth: Payer: Self-pay | Admitting: Emergency Medicine

## 2015-01-30 MED ORDER — FLUTICASONE PROPIONATE 50 MCG/ACT NA SUSP: 2.0000 | Freq: Every day | NASAL | Status: DC

## 2015-01-30 NOTE — Telephone Encounter (Signed)
Please have her try fluticasone nasal spray, 2 sprays each side 1-2 x a day  Also please tell her that her cultures and biopsies from the bronchoscopy are all negative. She will need to follow with dr Joya Gaskins as planned.

## 2015-01-30 NOTE — Telephone Encounter (Signed)
Spoke with pt, states she had bronch 2 days ago and is now having L sided PND, runny nose, sneezing.  Denies chest congestion, fever.   Pt is not taking anything at this time to help with these s/s.  Is requesting recs from provider.  [facility] [address]    Sending to doc of day as RB is out of office.  Dr Melvyn Novas please advise.  Thanks!

## 2015-01-30 NOTE — Telephone Encounter (Signed)
Spoke with pt, she is aware of below results/recs.  rx called in to preferred pharmacy.  Nothing further needed.

## 2015-01-30 NOTE — Telephone Encounter (Signed)
lmtcb x1 for pt. 

## 2015-01-31 LAB — CULTURE, BAL-QUANTITATIVE

## 2015-01-31 LAB — CULTURE, BAL-QUANTITATIVE W GRAM STAIN: Colony Count: >=100000

## 2015-02-03 ENCOUNTER — Ambulatory Visit (INDEPENDENT_AMBULATORY_CARE_PROVIDER_SITE_OTHER): Payer: Medicare Other | Admitting: Internal Medicine

## 2015-02-03 ENCOUNTER — Encounter: Payer: Self-pay | Admitting: Internal Medicine

## 2015-02-03 VITALS — BP 100/68 | HR 76 | Temp 98.3°F | Resp 18 | Ht 65.0 in | Wt 142.0 lb

## 2015-02-03 DIAGNOSIS — Z23 Encounter for immunization: Secondary | ICD-10-CM

## 2015-02-03 DIAGNOSIS — E785 Hyperlipidemia, unspecified: Secondary | ICD-10-CM

## 2015-02-03 DIAGNOSIS — I10 Essential (primary) hypertension: Secondary | ICD-10-CM

## 2015-02-03 DIAGNOSIS — R918 Other nonspecific abnormal finding of lung field: Secondary | ICD-10-CM | POA: Diagnosis not present

## 2015-02-03 NOTE — Patient Instructions (Signed)
Limit your sodium (Salt) intake  Please check your blood pressure on a regular basis.  If it is consistently greater than 150/90, please make an office appointment.  Return in 6 months for follow-up  Pulmonary followup as scheduled

## 2015-02-03 NOTE — Progress Notes (Signed)
Pre visit review using our clinic review tool, if applicable. No additional management support is needed unless otherwise documented below in the visit note. 

## 2015-02-03 NOTE — Progress Notes (Signed)
Subjective:    Patient ID: Sandra Craig, female    DOB: April 30, 1941, 74 y.o.   MRN: [id number]  HPI  Date of Operation: 01/28/2015  Pre-op Diagnosis: Micronodular Infiltrate on CT scan of chest   Indications and History: Sandra Craig is 74 y.o. with history of micronodular peribronchovascular infiltrate on CT chest, most notable in the right upper lobe. Recommendation was to perform video fiberoptic bronchoscopy with BAL, biopsies. The risks, benefits, complications, treatment options and expected outcomes were discussed with the patient. The possibilities of pneumothorax, pneumonia, reaction to medication, pulmonary aspiration, perforation of a viscus, bleeding, failure to diagnose a condition and creating a complication requiring transfusion or operation were discussed with the patient who freely signed the consent.   Description of Procedure: The patient was seen in the Preoperative Area, was examined and was deemed appropriate to proceed. The patient was taken to [facility], identified as Sandra Craig and the procedure verified as Flexible Video Fiberoptic Bronchoscopy. A Time Out was held and the above information confirmed.   Conscious sedation was initiated as indicated above. The video fiberoptic bronchoscope was introduced via the right nare and a general inspection was performed which showed normal cords, normal trachea, normal main carina. The R sided airways were inspected and showed normal RUL, BI, RML and RLL. The L side was then inspected. The LLL, Lingular and LUL airways were normal. There were no endobronchial lesions or abnormal secretions.   Under fluoroscopic guidance transbronchial brushings and transbronchial biopsies were performed in the posterior segment of the right upper lobe. There was some initial moderate bleeding that stopped quickly. Finally bronchoalveolar lavage was performed in the right upper lobe to be sent for cytology. The patient  tolerated the procedure well. The bronchoscope was removed. There were no obvious complications.    74 year old patient who was seen today following a recent FOB with BAL  With biopsies and brushings.  Pathology date has been benign.  She is scheduled for pulmonary follow-up soon  She is doing well except for orthopedic issues.  She continues to have low back and left hip discomfort. She continues to receive active physical therapy  She has a history of essential hypertension which has been stable. She does monitor home blood pressure readings.  She has dyslipidemia and remains on Crestor.  Past Medical History  Diagnosis Date  . IBS (irritable bowel syndrome)   . Hypertension   . Hyperlipidemia   . IC (interstitial cystitis)   . Endometriosis   . Polycystic kidney disease   . Osteopenia   . Cystitis   . Fibromyalgia   . Palpitations   . Chronic insomnia   . Pulmonary nodule 12/08    5 mm Anterior RUL  . Hiatal hernia   . Diverticulosis of colon (without mention of hemorrhage)   . Internal hemorrhoid   . Family history of malignant neoplasm of gastrointestinal tract   . Gastritis   . Small bowel obstruction   . Osteonecrosis   . History of gallstones   . Diverticulitis of intestine without perforation or abscess without bleeding     Patient did have abscess but noperforation  . 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 Osteoporosis     History   Social History  . Marital Status: Widowed    Spouse Name: N/A  . Number of Children: N/A  . Years of Education: N/A   Occupational History  . Retired     [employer]  Social History Main Topics  . Smoking status: Former Research scientist (life sciences)  . Smokeless tobacco: Never Used     Comment: Quit in 1977  . Alcohol Use: No  . Drug Use: No  . Sexual Activity: No   Other Topics Concern  . Not on file   Social History Narrative    Past Surgical History  Procedure Laterality Date  . Cholecystectomy    . Pelvic  laparoscopy    . Abdominal hysterectomy  1994    TAH,BSO FOR ENDOMETRIOSIS  . Oophorectomy  1994    TAH,BSO  . Total hip arthroplasty  FALL OF 2008    rt. partial hip replacement  . Abdominal surgery  2011    small intestine blockage  . S/p right shoulder rotater cuff  200216/2011    Tear/adhesive capsulitis  . Sbo lap  11    Adhesions and small internal hernia  . Joint replacement  2008  . Gastroplasty  2011    small bowel resection -open  . Total shoulder arthroplasty  06/05/2012    Procedure: TOTAL SHOULDER ARTHROPLASTY;  Surgeon: Johnny Bridge, MD;  Location: [facility];  Service: Orthopedics;  Laterality: Right;  RIGHT SHOULDER TOTAL ARTHROPLASTY, HEMIARTHROPLASTY, SHOULDER, FOR ARTHRITIS  . Shoulder hemi-arthroplasty  06/05/2012    Procedure: SHOULDER HEMI-ARTHROPLASTY;  Surgeon: Johnny Bridge, MD;  Location: [facility];  Service: Orthopedics;  Laterality: Right;  FOR ARTHRITIS  . Video bronchoscopy Bilateral 01/28/2015    Procedure: VIDEO BRONCHOSCOPY WITH FLUORO;  Surgeon: Collene Gobble, MD;  Location: [facility];  Service: Cardiopulmonary;  Laterality: Bilateral;    Family History  Problem Relation Age of Onset  . Heart disease Mother     MI at age 11  . Lymphoma Maternal Grandmother   . Colon cancer Paternal Grandmother   . COPD Father   . Emphysema Mother   . Thyroid disease Mother     Thyroidectomy/Benign  . Cancer Brother     adenocarcinoma right lung    Allergies  Allergen Reactions  . Celecoxib   . Hydrocodone     Itching--has taken Percocet with no problem   . Prednisone Other (See Comments)    Made pt feel "crazy"  . Sulfonamide Derivatives     Current Outpatient Prescriptions on File Prior to Visit  Medication Sig Dispense Refill  . Calcium Carbonate-Vit D-Min (CALCIUM 1200 PO) Take 1 capsule by mouth daily.    . CRESTOR 20 MG tablet TAKE 1 BY MOUTH DAILY 90 tablet 0  . esomeprazole (NEXIUM) 40 MG capsule Take 1 capsule (40 mg total) by mouth daily. 30  capsule 3  . fluticasone (FLONASE) 50 MCG/ACT nasal spray Place 2 sprays into both nostrils daily. 16 g 2  . gabapentin (NEURONTIN) 300 MG capsule TAKE 1 BY MOUTH TWICE DAILY 180 capsule 0  . losartan (COZAAR) 50 MG tablet TAKE 1 BY MOUTH DAILY 90 tablet 0  . sertraline (ZOLOFT) 100 MG tablet TAKE 1 BY MOUTH DAILY 90 tablet 0  . zolpidem (AMBIEN) 10 MG tablet Take 1 tablet (10 mg total) by mouth at bedtime as needed. For sleep. 90 tablet 1   No current facility-administered medications on file prior to visit.    BP 100/68 mmHg  Pulse 76  Temp(Src) 98.3 F (36.8 C) (Oral)  Resp 18  Ht '5\' 5"'$  (1.651 m)  Wt 142 lb (64.411 kg)  BMI 23.63 kg/m2  SpO2 96%  LMP 08/25/1992       Review of Systems  Constitutional: Negative.  HENT: Negative for congestion, dental problem, hearing loss, rhinorrhea, sinus pressure, sore throat and tinnitus.   Eyes: Negative for pain, discharge and visual disturbance.  Respiratory: Negative for cough and shortness of breath.   Cardiovascular: Negative for chest pain, palpitations and leg swelling.  Gastrointestinal: Negative for nausea, vomiting, abdominal pain, diarrhea, constipation, blood in stool and abdominal distention.  Genitourinary: Negative for dysuria, urgency, frequency, hematuria, flank pain, vaginal bleeding, vaginal discharge, difficulty urinating, vaginal pain and pelvic pain.  Musculoskeletal: Positive for back pain, arthralgias and gait problem. Negative for joint swelling.  Skin: Negative for rash.  Neurological: Negative for dizziness, syncope, speech difficulty, weakness, numbness and headaches.  Hematological: Negative for adenopathy.  Psychiatric/Behavioral: Negative for behavioral problems, dysphoric mood and agitation. The patient is not nervous/anxious.        Objective:   Physical Exam  Constitutional: She is oriented to person, place, and time. She appears well-developed and well-nourished.  Blood pressure 110/70  HENT:    Head: Normocephalic.  Right Ear: External ear normal.  Left Ear: External ear normal.  Mouth/Throat: Oropharynx is clear and moist.  Eyes: Conjunctivae and EOM are normal. Pupils are equal, round, and reactive to light.  Neck: Normal range of motion. Neck supple. No thyromegaly present.  Cardiovascular: Normal rate, regular rhythm, normal heart sounds and intact distal pulses.   Pulmonary/Chest: Effort normal and breath sounds normal. No respiratory distress. She has no wheezes. She has no rales.  Abdominal: Soft. Bowel sounds are normal. She exhibits no mass. There is no tenderness.  Musculoskeletal: Normal range of motion.  Lymphadenopathy:    She has no cervical adenopathy.  Neurological: She is alert and oriented to person, place, and time.  Skin: Skin is warm and dry. No rash noted.  Psychiatric: She has a normal mood and affect. Her behavior is normal.          Assessment & Plan:   Hypertension, well-controlled.  No change in therapy.  We'll continue home blood pressure monitoring Dyslipidemia.  Continue statin therapy Status post FOB/BAL-follow-up pulmonary later this month  Chronic low back and left hip pain.  Continue physical therapy.  Orthopedic follow-up  Schedule CPX with PCP in 6 months

## 2015-02-06 DIAGNOSIS — M545 Low back pain: Secondary | ICD-10-CM | POA: Diagnosis not present

## 2015-02-06 DIAGNOSIS — M5416 Radiculopathy, lumbar region: Secondary | ICD-10-CM | POA: Diagnosis not present

## 2015-02-10 ENCOUNTER — Ambulatory Visit (INDEPENDENT_AMBULATORY_CARE_PROVIDER_SITE_OTHER): Payer: Medicare Other | Admitting: Critical Care Medicine

## 2015-02-10 ENCOUNTER — Encounter: Payer: Self-pay | Admitting: Critical Care Medicine

## 2015-02-10 VITALS — BP 102/72 | HR 83 | Temp 98.0°F | Ht 65.5 in | Wt 141.4 lb

## 2015-02-10 DIAGNOSIS — M545 Low back pain: Secondary | ICD-10-CM | POA: Diagnosis not present

## 2015-02-10 DIAGNOSIS — R918 Other nonspecific abnormal finding of lung field: Secondary | ICD-10-CM

## 2015-02-10 DIAGNOSIS — M5416 Radiculopathy, lumbar region: Secondary | ICD-10-CM | POA: Diagnosis not present

## 2015-02-10 NOTE — Patient Instructions (Signed)
We will call if cultures are positive No other changes Return as needed

## 2015-02-11 NOTE — Progress Notes (Signed)
   Subjective:    Patient ID: Sandra Craig, female    DOB: 05/15/1941, 74 y.o.   MRN: [id number]  HPI 02/11/2015 Chief Complaint  Patient presents with  . Follow-up    breathing doing same.  discuss cough.  no other concerns.    Doing well. No complaints. S/p fob 6/29, here to f/u results Pt denies any significant sore throat, nasal congestion or excess secretions, fever, chills, sweats, unintended weight loss, pleurtic or exertional chest pain, orthopnea PND, or leg swelling Pt denies any increase in rescue therapy over baseline, denies waking up needing it or having any early am or nocturnal exacerbations of coughing/wheezing/or dyspnea. Pt also denies any obvious fluctuation in symptoms with  weather or environmental change or other alleviating or aggravating factors   Current Medications, Allergies, Complete Past Medical History, Past Surgical History, Family History, and Social History were reviewed in [place] [facility] record per todays encounter:  02/11/2015     Review of Systems  Constitutional: Negative.   HENT: Negative.  Negative for ear pain, postnasal drip, rhinorrhea, sinus pressure, sore throat, trouble swallowing and voice change.   Eyes: Negative.   Respiratory: Negative.  Negative for apnea, cough, choking, chest tightness, shortness of breath, wheezing and stridor.   Cardiovascular: Negative.  Negative for chest pain, palpitations and leg swelling.  Gastrointestinal: Negative.  Negative for nausea, vomiting, abdominal pain and abdominal distention.  Genitourinary: Negative.   Musculoskeletal: Negative.  Negative for myalgias and arthralgias.  Skin: Negative.  Negative for rash.  Allergic/Immunologic: Negative.  Negative for environmental allergies and food allergies.  Neurological: Negative.  Negative for dizziness, syncope, weakness and headaches.  Hematological: Negative.  Negative for adenopathy. Does not bruise/bleed easily.    Psychiatric/Behavioral: Negative.  Negative for sleep disturbance and agitation. The patient is not nervous/anxious.        Objective:   Physical Exam Filed Vitals:   02/10/15 0910  BP: 102/72  Pulse: 83  Temp: 98 F (36.7 C)  TempSrc: Oral  Height: 5' 5.5" (1.664 m)  Weight: 141 lb 6.4 oz (64.139 kg)  SpO2: 98%    Gen: Pleasant, well-nourished, in no distress,  normal affect  ENT: No lesions,  mouth clear,  oropharynx clear, no postnasal drip  Neck: No JVD, no TMG, no carotid bruits  Lungs: No use of accessory muscles, no dullness to percussion, clear without rales or rhonchi  Cardiovascular: RRR, heart sounds normal, no murmur or gallops, no peripheral edema  Abdomen: soft and NT, no HSM,  BS normal  Musculoskeletal: No deformities, no cyanosis or clubbing  Neuro: alert, non focal  Skin: Warm, no lesions or rashes  No results found.        Assessment & Plan:  I personally reviewed all images and lab data in the [facility] system as well as any outside material available during this office visit and agree with the  radiology impressions.   Multiple nodules of lung Recent Fob for RUL tree in bud infiltrate peribronchial nodular  ??NTM??.  Pt relatively asymptomatic Fob neg for pathogens Plan Observation only No Rx needed Pt understands and agree with plan Return pulm prn    Kateleen was seen today for follow-up.  Diagnoses and all orders for this visit:  Multiple nodules of lung    I had an extended discussion with the patient and or family lasting 10 minutes of a 25 minute visit including:  Diagnosis, no need to treat

## 2015-02-11 NOTE — Assessment & Plan Note (Signed)
Recent Fob for RUL tree in bud infiltrate peribronchial nodular  ??NTM??.  Pt relatively asymptomatic Fob neg for pathogens Plan Observation only No Rx needed Pt understands and agree with plan Return pulm prn

## 2015-02-17 DIAGNOSIS — M5416 Radiculopathy, lumbar region: Secondary | ICD-10-CM | POA: Diagnosis not present

## 2015-02-17 DIAGNOSIS — M545 Low back pain: Secondary | ICD-10-CM | POA: Diagnosis not present

## 2015-02-18 DIAGNOSIS — M5416 Radiculopathy, lumbar region: Secondary | ICD-10-CM | POA: Diagnosis not present

## 2015-02-18 DIAGNOSIS — M545 Low back pain: Secondary | ICD-10-CM | POA: Diagnosis not present

## 2015-02-20 ENCOUNTER — Telehealth: Payer: Self-pay | Admitting: Emergency Medicine

## 2015-02-20 NOTE — Telephone Encounter (Signed)
BRONCHIAL ALVEOLAR LAVAGE    Special Requests RIGHT UPPER LOBE   Acid Fast Smear NO ACID FAST BACILLI SEEN  Performed at [facility]       Culture ACID FAST BACILLI ISOLATED

## 2015-02-23 NOTE — Telephone Encounter (Signed)
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 I called the patient to let her know about this new information but was unable to reach her. I left a message for her to calls back or that we would try to contact her again.

## 2015-02-24 DIAGNOSIS — M4310 Spondylolisthesis, site unspecified: Secondary | ICD-10-CM | POA: Diagnosis not present

## 2015-02-25 NOTE — Telephone Encounter (Signed)
lmtcb

## 2015-02-25 NOTE — Telephone Encounter (Signed)
Reviewed preliminary data with pt. Shows AFB, not yet speciated, almost certainly atypical mycobacteria.   Need to make an OV with Dr Joya Gaskins to discuss whether she should be treated.

## 2015-02-26 LAB — FUNGUS CULTURE W SMEAR: Fungal Smear: NONE SEEN

## 2015-02-26 NOTE — Telephone Encounter (Signed)
Pt cb, [phone] sched her a appt for 03/02/15 @ 11:45am

## 2015-03-02 ENCOUNTER — Telehealth: Payer: Self-pay | Admitting: Critical Care Medicine

## 2015-03-02 ENCOUNTER — Ambulatory Visit: Payer: Medicare Other | Admitting: Critical Care Medicine

## 2015-03-02 NOTE — Telephone Encounter (Signed)
Crystal please advise where pt can be scheduled. Can she see TP? Thanks.

## 2015-03-03 NOTE — Telephone Encounter (Signed)
Spoke with pt.  Pt states breathing is stable at this time.  Would like to schedule follow up with PW to discuss possible back surgery.  This has not yet been scheduled and will not be until at least the end of August per pt.  OV scheduled with PW for Aug 15 at 8:45 am.  Pt confirmed appt and voiced no further questions or concerns at this time.  She is to call back if needed prior to appt.

## 2015-03-04 ENCOUNTER — Encounter: Payer: Self-pay | Admitting: Internal Medicine

## 2015-03-04 ENCOUNTER — Ambulatory Visit (INDEPENDENT_AMBULATORY_CARE_PROVIDER_SITE_OTHER): Payer: Medicare Other | Admitting: Internal Medicine

## 2015-03-04 ENCOUNTER — Telehealth: Payer: Self-pay | Admitting: Critical Care Medicine

## 2015-03-04 VITALS — BP 108/70 | HR 74 | Temp 98.2°F | Ht 66.25 in | Wt 140.0 lb

## 2015-03-04 DIAGNOSIS — I10 Essential (primary) hypertension: Secondary | ICD-10-CM | POA: Diagnosis not present

## 2015-03-04 DIAGNOSIS — E782 Mixed hyperlipidemia: Secondary | ICD-10-CM | POA: Diagnosis not present

## 2015-03-04 DIAGNOSIS — R918 Other nonspecific abnormal finding of lung field: Secondary | ICD-10-CM

## 2015-03-04 DIAGNOSIS — M545 Low back pain: Secondary | ICD-10-CM

## 2015-03-04 DIAGNOSIS — F32A Depression, unspecified: Secondary | ICD-10-CM

## 2015-03-04 DIAGNOSIS — Z0000 Encounter for general adult medical examination without abnormal findings: Secondary | ICD-10-CM | POA: Diagnosis not present

## 2015-03-04 DIAGNOSIS — F329 Major depressive disorder, single episode, unspecified: Secondary | ICD-10-CM

## 2015-03-04 DIAGNOSIS — M810 Age-related osteoporosis without current pathological fracture: Secondary | ICD-10-CM

## 2015-03-04 LAB — CBC WITH DIFFERENTIAL/PLATELET
Basophils Absolute: 0 10*3/uL (ref 0.0–0.1)
Basophils Relative: 0.3 % (ref 0.0–3.0)
Eosinophils Absolute: 0.1 10*3/uL (ref 0.0–0.7)
Eosinophils Relative: 1.3 % (ref 0.0–5.0)
HCT: 38.8 % (ref 36.0–46.0)
Hemoglobin: 12.9 g/dL (ref 12.0–15.0)
Lymphocytes Relative: 22 % (ref 12.0–46.0)
Lymphs Abs: 1.6 10*3/uL (ref 0.7–4.0)
MCHC: 33.3 g/dL (ref 30.0–36.0)
MCV: 88.3 fl (ref 78.0–100.0)
Monocytes Absolute: 0.5 10*3/uL (ref 0.1–1.0)
Monocytes Relative: 6.3 % (ref 3.0–12.0)
Neutro Abs: 5 10*3/uL (ref 1.4–7.7)
Neutrophils Relative %: 70.1 % (ref 43.0–77.0)
Platelets: 187 10*3/uL (ref 150.0–400.0)
RBC: 4.4 Mil/uL (ref 3.87–5.11)
RDW: 15 % (ref 11.5–15.5)
WBC: 7.1 10*3/uL (ref 4.0–10.5)

## 2015-03-04 LAB — LIPID PANEL
Cholesterol: 154 mg/dL (ref 0–200)
HDL: 48.4 mg/dL (ref >39.00)
LDL Cholesterol: 73 mg/dL (ref 0–99)
NonHDL: 105.54
Total CHOL/HDL Ratio: 3
Triglycerides: 163 mg/dL — ABNORMAL HIGH (ref 0.0–149.0)
VLDL: 32.6 mg/dL (ref 0.0–40.0)

## 2015-03-04 LAB — HEPATIC FUNCTION PANEL
ALT: 25 U/L (ref 0–35)
AST: 31 U/L (ref 0–37)
Albumin: 4.2 g/dL (ref 3.5–5.2)
Alkaline Phosphatase: 60 U/L (ref 39–117)
Bilirubin, Direct: 0.1 mg/dL (ref 0.0–0.3)
Total Bilirubin: 0.5 mg/dL (ref 0.2–1.2)
Total Protein: 7.1 g/dL (ref 6.0–8.3)

## 2015-03-04 LAB — BASIC METABOLIC PANEL
BUN: 25 mg/dL — ABNORMAL HIGH (ref 6–23)
CO2: 31 mEq/L (ref 19–32)
Calcium: 9.9 mg/dL (ref 8.4–10.5)
Chloride: 105 mEq/L (ref 96–112)
Creatinine, Ser: 1.42 mg/dL — ABNORMAL HIGH (ref 0.40–1.20)
GFR: 38.56 mL/min — ABNORMAL LOW (ref >60.00)
Glucose, Bld: 88 mg/dL (ref 70–99)
Potassium: 5 mEq/L (ref 3.5–5.1)
Sodium: 141 mEq/L (ref 135–145)

## 2015-03-04 LAB — TSH: TSH: 1.89 u[IU]/mL (ref 0.35–4.50)

## 2015-03-04 MED ORDER — ZOLPIDEM TARTRATE 10 MG PO TABS: 10.0000 mg | ORAL_TABLET | Freq: Every evening | ORAL | Status: DC | PRN

## 2015-03-04 NOTE — Progress Notes (Signed)
Subjective:    Patient ID: Sandra Craig, female    DOB: [birth year], 74 y.o.   MRN: [id number]  HPI  74 year old white female with history of hypertension, hyperlipidemia, right upper lobe nodules and polycystic kidney disease presents for Medicare wellness exam.  Interval medical history-patient seen by pulmonologist for slight enlargement of right upper lobe nodule. She underwent bronchoscopy. Bronchial washings negative for malignancy but positive for Mycobacterium avium complex.  Depression - patient taking sertraline 100 mg once daily. She admits to worsening depressive symptoms which is likely secondary to her chronic issues with low back pain.  Polycystic kidney disease-her serum creatinine is stable.   Review of Systems  Constitutional: Negative for activity change, appetite change and unexpected weight change.  Eyes: Negative for visual disturbance.  Respiratory: Negative for cough, she experiences respiratory issues at night Cardiovascular: Negative for chest pain.  Genitourinary: Negative for difficulty urinating.  Neurological: Negative for headaches.  Gastrointestinal: Negative for abdominal pain, heartburn melena or hematochezia Psych: Positive for depression Endo:  No polyuria or polydypsia        Past Medical History  Diagnosis Date  . IBS (irritable bowel syndrome)   . Hypertension   . Hyperlipidemia   . IC (interstitial cystitis)   . Endometriosis   . Polycystic kidney disease   . Osteopenia   . Cystitis   . Fibromyalgia   . Palpitations   . Chronic insomnia   . Pulmonary nodule 12/08    5 mm Anterior RUL  . Hiatal hernia   . Diverticulosis of colon (without mention of hemorrhage)   . Internal hemorrhoid   . Family history of malignant neoplasm of gastrointestinal tract   . Gastritis   . Small bowel obstruction   . Osteonecrosis   . History of gallstones   . Diverticulitis of intestine without perforation or abscess without bleeding    Patient did have abscess but noperforation  . 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 Osteoporosis     History   Social History  . Marital Status: Widowed    Spouse Name: N/A  . Number of Children: N/A  . Years of Education: N/A   Occupational History  . Retired     [employer]   Social History Main Topics  . Smoking status: Former Research scientist (life sciences)  . Smokeless tobacco: Never Used     Comment: Quit in 1977  . Alcohol Use: No  . Drug Use: No  . Sexual Activity: No   Other Topics Concern  . Not on file   Social History Narrative    Past Surgical History  Procedure Laterality Date  . Cholecystectomy    . Pelvic laparoscopy    . Abdominal hysterectomy  1994    TAH,BSO FOR ENDOMETRIOSIS  . Oophorectomy  1994    TAH,BSO  . Total hip arthroplasty  FALL OF 2008    rt. partial hip replacement  . Abdominal surgery  2011    small intestine blockage  . S/p right shoulder rotater cuff  200216/2011    Tear/adhesive capsulitis  . Sbo lap  11    Adhesions and small internal hernia  . Joint replacement  2008  . Gastroplasty  2011    small bowel resection -open  . Total shoulder arthroplasty  06/05/2012    Procedure: TOTAL SHOULDER ARTHROPLASTY;  Surgeon: Johnny Bridge, MD;  Location: [place];  Service: Orthopedics;  Laterality: Right;  RIGHT SHOULDER TOTAL ARTHROPLASTY, HEMIARTHROPLASTY, SHOULDER, FOR ARTHRITIS  .  Shoulder hemi-arthroplasty  06/05/2012    Procedure: SHOULDER HEMI-ARTHROPLASTY;  Surgeon: Johnny Bridge, MD;  Location: [place];  Service: Orthopedics;  Laterality: Right;  FOR ARTHRITIS  . Video bronchoscopy Bilateral 01/28/2015    Procedure: VIDEO BRONCHOSCOPY WITH FLUORO;  Surgeon: Collene Gobble, MD;  Location: [place];  Service: Cardiopulmonary;  Laterality: Bilateral;    Family History  Problem Relation Age of Onset  . Heart disease Mother     MI at age 80  . Lymphoma Maternal Grandmother   . Colon cancer Paternal Grandmother   . COPD  Father   . Emphysema Mother   . Thyroid disease Mother     Thyroidectomy/Benign  . Cancer Brother     adenocarcinoma right lung    Allergies  Allergen Reactions  . Celecoxib   . Hydrocodone     Itching--has taken Percocet with no problem   . Prednisone Other (See Comments)    Made pt feel "crazy"  . Sulfonamide Derivatives     Current Outpatient Prescriptions on File Prior to Visit  Medication Sig Dispense Refill  . Calcium Carbonate-Vit D-Min (CALCIUM 1200 PO) Take 1 capsule by mouth daily.    . CRESTOR 20 MG tablet TAKE 1 BY MOUTH DAILY 90 tablet 0  . fluticasone (FLONASE) 50 MCG/ACT nasal spray Place 2 sprays into both nostrils daily. 16 g 2  . gabapentin (NEURONTIN) 300 MG capsule TAKE 1 BY MOUTH TWICE DAILY 180 capsule 0  . losartan (COZAAR) 50 MG tablet TAKE 1 BY MOUTH DAILY 90 tablet 0  . sertraline (ZOLOFT) 100 MG tablet TAKE 1 BY MOUTH DAILY 90 tablet 0  . zolpidem (AMBIEN) 10 MG tablet Take 1 tablet (10 mg total) by mouth at bedtime as needed. For sleep. 90 tablet 1   No current facility-administered medications on file prior to visit.    BP 108/70 mmHg  Pulse 74  Temp(Src) 98.2 F (36.8 C) (Oral)  Ht 5' 6.25" (1.683 m)  Wt 140 lb (63.504 kg)  BMI 22.42 kg/m2  LMP 08/25/1992    Objective:   Physical Exam  Constitutional: She is oriented to person, place, and time. She appears well-developed and well-nourished. No distress.  HENT:  Head: Normocephalic and atraumatic.  Right Ear: External ear normal.  Left Ear: External ear normal.  Diminished hearing right ear  Eyes: Conjunctivae and EOM are normal. Pupils are equal, round, and reactive to light.  Neck: Neck supple.  Cardiovascular: Normal rate, regular rhythm and normal heart sounds.   No murmur heard. Pulmonary/Chest: Effort normal and breath sounds normal. She has no wheezes.  Abdominal: Soft. Bowel sounds are normal. She exhibits no distension and no mass.  Musculoskeletal: She exhibits no edema.    Lymphadenopathy:    She has no cervical adenopathy.  Neurological: She is alert and oriented to person, place, and time.  Skin: Skin is warm and dry. No erythema.  Psychiatric: She has a normal mood and affect.        Assessment & Plan:   1. Preventative health care 2. Chronic low back pain 3. RUL nodule 4. Polycystic kidney disease 5. Hypertension 6. Hyperlipidemia  Medicare wellness questionnaire reviewed in detail with patient. Screening for alcohol and tobacco use negative. She currently requires assistance with daily living due to issues with low back pain. No specific issues with gait instability. Patient has difficulty hearing finger rub in right ear. Patient advised to follow-up with audiology for full hearing test. She has chronic history of depression  that seems to be worse due to chronic pain. Arrange referral to behavioral health.  Patient followed by neurosurgeon for chronic low back pain. She has tried and failed physical therapy and multiple epidural steroidal injections.  Patient advised to try water aerobic therapy before proceeding with surgical intervention.  Patient followed by Dr. Joya Gaskins and Dr. Lamonte Sakai. She recently underwent bronchoscopy.  Culture shows mycobacterium avium intracelluare complex.  She will follow up with pulmonary regarding possible antibiotic therapy.  Continue same dose of losartan and crestor.  Monitor BMET, FLP and LFTs.

## 2015-03-04 NOTE — Telephone Encounter (Signed)
Spoke with Anne Ng at [place] for a critical lab.  Bronchoalveolar lavage positive for mycobacterium avium intracellulare   Sending to Dr. Joya Gaskins as well as doc of the day to see if anything is immediately needed. Dr. Elsworth Soho please advise.  Thanks!

## 2015-03-04 NOTE — Progress Notes (Signed)
Pre visit review using our clinic review tool, if applicable. No additional management support is needed unless otherwise documented below in the visit note. 

## 2015-03-04 NOTE — Patient Instructions (Signed)
Obtain hearing test by audiologist as directed

## 2015-03-04 NOTE — Telephone Encounter (Signed)
Can wait for PW to answer

## 2015-03-05 NOTE — Telephone Encounter (Signed)
We already have this information

## 2015-03-15 LAB — AFB CULTURE WITH SMEAR (NOT AT ARMC): Acid Fast Smear: NONE SEEN

## 2015-03-16 ENCOUNTER — Other Ambulatory Visit: Payer: Self-pay | Admitting: Internal Medicine

## 2015-03-16 ENCOUNTER — Encounter: Payer: Self-pay | Admitting: Critical Care Medicine

## 2015-03-16 ENCOUNTER — Ambulatory Visit (INDEPENDENT_AMBULATORY_CARE_PROVIDER_SITE_OTHER): Payer: Medicare Other | Admitting: Critical Care Medicine

## 2015-03-16 VITALS — BP 112/76 | HR 77 | Temp 98.2°F | Ht 66.0 in | Wt 140.8 lb

## 2015-03-16 DIAGNOSIS — A310 Pulmonary mycobacterial infection: Secondary | ICD-10-CM

## 2015-03-16 MED ORDER — ETHAMBUTOL HCL 400 MG PO TABS: 1200.0000 mg | ORAL_TABLET | ORAL | Status: DC

## 2015-03-16 MED ORDER — RIFAMPIN 300 MG PO CAPS: 600.0000 mg | ORAL_CAPSULE | ORAL | Status: DC

## 2015-03-16 MED ORDER — AZITHROMYCIN 500 MG PO TABS: 500.0000 mg | ORAL_TABLET | ORAL | Status: DC

## 2015-03-16 MED ORDER — ETHAMBUTOL HCL 400 MG PO TABS: 800.0000 mg | ORAL_TABLET | Freq: Every day | ORAL | Status: DC

## 2015-03-16 NOTE — Progress Notes (Signed)
Subjective:    Patient ID: Sandra Craig, female    DOB: [birth year], 74 y.o.   MRN: [id number]  HPI 03/16/2015 Chief Complaint  Patient presents with  . Follow-up    to discuss further tx and possible back surgery.  SOB when walking is unchanged.  Cough unchanged.  Chest fullness.     Bal growing MAI Still dry cough, if eats will cough after eating.  Stil with DOE.  No fever. No soaking night sweats.  Notes some qhs cough at night.  Weight is down.  Pt needs back surgery.   Lumbar surgery needed : Dr Trenton Gammon.  Pins in brace in back.    Current Medications, Allergies, Complete Past Medical History, Past Surgical History, Family History, and Social History were reviewed in [facility] record per todays encounter:  03/16/2015     Review of Systems  Constitutional: Positive for fatigue.  HENT: Negative.  Negative for ear pain, postnasal drip, rhinorrhea, sinus pressure, sore throat, trouble swallowing and voice change.   Eyes: Negative.   Respiratory: Positive for cough, shortness of breath and wheezing. Negative for apnea, choking, chest tightness and stridor.   Cardiovascular: Negative.  Negative for chest pain, palpitations and leg swelling.  Gastrointestinal: Negative.  Negative for nausea, vomiting, abdominal pain and abdominal distention.  Genitourinary: Negative.   Musculoskeletal: Negative.  Negative for myalgias and arthralgias.  Skin: Negative.  Negative for rash.  Allergic/Immunologic: Negative.  Negative for environmental allergies and food allergies.  Neurological: Negative.  Negative for dizziness, syncope, weakness and headaches.  Hematological: Negative.  Negative for adenopathy. Does not bruise/bleed easily.  Psychiatric/Behavioral: Negative.  Negative for sleep disturbance and agitation. The patient is not nervous/anxious.        Objective:   Physical Exam  Filed Vitals:   03/16/15 0903  BP: 112/76  Pulse: 77  Temp: 98.2 F (36.8 C)    TempSrc: Oral  Height: '5\' 6"'$  (1.676 m)  Weight: 140 lb 12.8 oz (63.866 kg)  SpO2: 93%    Gen: Pleasant, well-nourished, in no distress,  normal affect  ENT: No lesions,  mouth clear,  oropharynx clear, no postnasal drip  Neck: No JVD, no TMG, no carotid bruits  Lungs: No use of accessory muscles, no dullness to percussion,exp squeaks RUL   Cardiovascular: RRR, heart sounds normal, no murmur or gallops, no peripheral edema  Abdomen: soft and NT, no HSM,  BS normal  Musculoskeletal: No deformities, no cyanosis or clubbing  Neuro: alert, non focal  Skin: Warm, no lesions or rashes  No results found. 6/29 AFB BAL: pos MAI  IMPRESSION: Previously identified tree in bud opacities in the RIGHT upper lobe have slightly increased with a number of the small nodular foci appearing larger and slightly more well defined; these remain segmental in distribution with associated mild bronchiectasis and could be potentially postinflammatory in origin but requiring continued short-term followup assessment to determine significance.    Assessment & Plan:  I personally reviewed all images and lab data in the [facility] system as well as any outside material available during this office visit and agree with the  radiology impressions.   MAI (mycobacterium avium-intracellulare) infection Fob bal pos for MAI on culture .  Nodules on lung d/t MAI in RUL Pt is symptomatic Plan Start azithromycin '500mg'$  three times weekly Start rifampin '300mg'$  two three times weekly  Start myambutol '400mg'$  3 three times weekly Return one month with liver function tests     Sandra Craig was  seen today for follow-up.  Diagnoses and all orders for this visit:  MAI (mycobacterium avium-intracellulare) infection  Other orders -     Discontinue: ethambutol (MYAMBUTOL) 400 MG tablet; Take 2 tablets (800 mg total) by mouth daily. -     rifampin (RIFADIN) 300 MG capsule; Take 2 capsules (600 mg total) by mouth 3 (three) times  a week. -     azithromycin (ZITHROMAX) 500 MG tablet; Take 1 tablet (500 mg total) by mouth 3 (three) times a week. -     ethambutol (MYAMBUTOL) 400 MG tablet; Take 3 tablets (1,200 mg total) by mouth 3 (three) times a week.    I had an extended discussion with the patient and or family lasting 10 minutes of a 25 minute visit including:  Dx, tx options

## 2015-03-16 NOTE — Assessment & Plan Note (Signed)
Fob bal pos for MAI on culture .  Nodules on lung d/t MAI in RUL Pt is symptomatic Plan Start azithromycin '500mg'$  three times weekly Start rifampin '300mg'$  two three times weekly  Start myambutol '400mg'$  3 three times weekly Return one month with liver function tests

## 2015-03-16 NOTE — Patient Instructions (Addendum)
Start azithromycin '500mg'$  three times weekly Start rifampin '300mg'$  two three times weekly  Start myambutol '400mg'$  3 three times weekly Return one month with liver function tests

## 2015-03-17 ENCOUNTER — Encounter: Payer: Self-pay | Admitting: Critical Care Medicine

## 2015-03-17 MED ORDER — GABAPENTIN 300 MG PO CAPS: 300.0000 mg | ORAL_CAPSULE | Freq: Two times a day (BID) | ORAL | Status: DC

## 2015-03-17 MED ORDER — LOSARTAN POTASSIUM 50 MG PO TABS: 50.0000 mg | ORAL_TABLET | Freq: Every day | ORAL | Status: DC

## 2015-03-17 MED ORDER — ROSUVASTATIN CALCIUM 20 MG PO TABS: 20.0000 mg | ORAL_TABLET | Freq: Every day | ORAL | Status: DC

## 2015-03-17 MED ORDER — SERTRALINE HCL 100 MG PO TABS: 100.0000 mg | ORAL_TABLET | Freq: Every day | ORAL | Status: DC

## 2015-03-17 NOTE — Telephone Encounter (Signed)
Per pt email sent in today: Dr. Joya Gaskins, should I take all three meds at the same time each day or better to space them out? Not that it matters at this point, but did current situation develop from the first nodule detected eight years ago?  Thank you for all you do --  Please advise Dr. Joya Gaskins thanks

## 2015-03-17 NOTE — Addendum Note (Signed)
Addended by: Westley Hummer B on: 03/17/2015 09:54 AM   Modules accepted: Orders

## 2015-03-19 ENCOUNTER — Encounter: Payer: Self-pay | Admitting: Internal Medicine

## 2015-03-19 DIAGNOSIS — H539 Unspecified visual disturbance: Secondary | ICD-10-CM

## 2015-03-19 DIAGNOSIS — H9190 Unspecified hearing loss, unspecified ear: Secondary | ICD-10-CM

## 2015-03-20 ENCOUNTER — Other Ambulatory Visit: Payer: Self-pay | Admitting: Internal Medicine

## 2015-03-20 ENCOUNTER — Encounter: Payer: Self-pay | Admitting: *Deleted

## 2015-03-20 MED ORDER — SERTRALINE HCL 100 MG PO TABS: 150.0000 mg | ORAL_TABLET | Freq: Every day | ORAL | Status: DC

## 2015-03-23 DIAGNOSIS — Z961 Presence of intraocular lens: Secondary | ICD-10-CM | POA: Diagnosis not present

## 2015-03-23 DIAGNOSIS — H524 Presbyopia: Secondary | ICD-10-CM | POA: Diagnosis not present

## 2015-03-25 ENCOUNTER — Encounter: Payer: Self-pay | Admitting: Critical Care Medicine

## 2015-03-25 NOTE — Telephone Encounter (Signed)
Per pt email sent in today: received antibiotics by mail this past Monday (03/23/15) and started as prescribed.  Question: Next appointment for liver function test is 04/15/15. Will I have been taking medicine long enough for the labs to be done? ---  Please advise Dr. Joya Gaskins thanks

## 2015-03-26 DIAGNOSIS — M545 Low back pain: Secondary | ICD-10-CM | POA: Diagnosis not present

## 2015-03-26 DIAGNOSIS — M62830 Muscle spasm of back: Secondary | ICD-10-CM | POA: Diagnosis not present

## 2015-03-26 DIAGNOSIS — M4316 Spondylolisthesis, lumbar region: Secondary | ICD-10-CM | POA: Diagnosis not present

## 2015-03-26 DIAGNOSIS — M5432 Sciatica, left side: Secondary | ICD-10-CM | POA: Diagnosis not present

## 2015-03-26 DIAGNOSIS — M9903 Segmental and somatic dysfunction of lumbar region: Secondary | ICD-10-CM | POA: Diagnosis not present

## 2015-03-30 ENCOUNTER — Encounter: Payer: Self-pay | Admitting: Adult Health

## 2015-03-30 ENCOUNTER — Ambulatory Visit (INDEPENDENT_AMBULATORY_CARE_PROVIDER_SITE_OTHER): Payer: Medicare Other | Admitting: Adult Health

## 2015-03-30 VITALS — BP 126/80 | Temp 98.8°F | Ht 66.0 in | Wt 138.8 lb

## 2015-03-30 DIAGNOSIS — M545 Low back pain, unspecified: Secondary | ICD-10-CM

## 2015-03-30 DIAGNOSIS — M9903 Segmental and somatic dysfunction of lumbar region: Secondary | ICD-10-CM | POA: Diagnosis not present

## 2015-03-30 DIAGNOSIS — M4316 Spondylolisthesis, lumbar region: Secondary | ICD-10-CM | POA: Diagnosis not present

## 2015-03-30 DIAGNOSIS — M62830 Muscle spasm of back: Secondary | ICD-10-CM | POA: Diagnosis not present

## 2015-03-30 DIAGNOSIS — M5432 Sciatica, left side: Secondary | ICD-10-CM | POA: Diagnosis not present

## 2015-03-30 NOTE — Progress Notes (Signed)
Subjective:    Patient ID: Sandra Craig, female    DOB: 1940/09/13, 74 y.o.   MRN: [id number]  HPI  Pleasant 74 year old female, patient of Dr. use, who I'm seeing in his absence. Presents to the office today multiple complaints. 1) low back pain. This has been an ongoing issue with her most recently had an MRI done at 06 2015 which showed  IMPRESSION: 1. Mild lateral recess narrowing bilaterally at L3-4 and L5-S1 is worse on the left. There broad-based disc protrusions at both levels. 2. 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 Mild to moderate lateral recess narrowing is present bilaterally. 3. The anterolisthesis scratch the given the degree of facet arthropathy, the anterolisthesis at L4-5 may be dynamic. Flexion and extension views of the lumbar spine would be useful for further evaluation. 4. Mild lateral recess scratch the mild foraminal narrowing bilaterally at L4-5. 5. Transitional anatomy at S1.  -She had been talking to neurosurgery Dr. Annette Stable, then had a bronchoscopy done which was positive for  Myco bacterium avium complex.She is currently being treated with: Start azithromycin '500mg'$  three times weekly Start rifampin '300mg'$  two three times weekly  Start myambutol '400mg'$  3 three times weekly  Her back surgery was canceled until this therapy was completed. She continues to have lower back pain, would like a new prescription for physical therapy.  2) she would like to know what medication she can cut out of her medication regimen. She would like to know if she can take tumeric act due to her chiropractor wanting her to take this supplement for inflammation. Review of Systems  Constitutional: Positive for fatigue.  Respiratory: Positive for cough and shortness of breath (on exertion).   Cardiovascular: Negative.   Genitourinary: Negative.   Musculoskeletal: Positive for myalgias, back pain and arthralgias.  Neurological: Negative.     Psychiatric/Behavioral: Negative.   All other systems reviewed and are negative.  Past Medical History  Diagnosis Date  . IBS (irritable bowel syndrome)   . Hypertension   . Hyperlipidemia   . IC (interstitial cystitis)   . Endometriosis   . Polycystic kidney disease   . Osteopenia   . Cystitis   . Fibromyalgia   . Palpitations   . Chronic insomnia   . Pulmonary nodule 12/08    5 mm Anterior RUL  . Hiatal hernia   . Diverticulosis of colon (without mention of hemorrhage)   . Internal hemorrhoid   . Family history of malignant neoplasm of gastrointestinal tract   . Gastritis   . Small bowel obstruction   . Osteonecrosis   . History of gallstones   . Diverticulitis of intestine without perforation or abscess without bleeding     Patient did have abscess but noperforation  . 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 Osteoporosis     Social History   Social History  . Marital Status: Widowed    Spouse Name: N/A  . Number of Children: N/A  . Years of Education: N/A   Occupational History  . Retired     [employer]   Social History Main Topics  . Smoking status: Former Smoker -- 0.75 packs/day for 8 years    Types: Cigarettes    Quit date: 08/01/1976  . Smokeless tobacco: Never Used     Comment: Quit in 1978  . Alcohol Use: No  . Drug Use: No  . Sexual Activity: No   Other Topics Concern  . Not on file  Social History Narrative    Past Surgical History  Procedure Laterality Date  . Cholecystectomy    . Pelvic laparoscopy    . Abdominal hysterectomy  1994    TAH,BSO FOR ENDOMETRIOSIS  . Oophorectomy  1994    TAH,BSO  . Total hip arthroplasty  FALL OF 2008    rt. partial hip replacement  . Abdominal surgery  2011    small intestine blockage  . S/p right shoulder rotater cuff  200216/2011    Tear/adhesive capsulitis  . Sbo lap  11    Adhesions and small internal hernia  . Joint replacement  2008  . Gastroplasty  2011     small bowel resection -open  . Total shoulder arthroplasty  06/05/2012    Procedure: TOTAL SHOULDER ARTHROPLASTY;  Surgeon: Johnny Bridge, MD;  Location: [place];  Service: Orthopedics;  Laterality: Right;  RIGHT SHOULDER TOTAL ARTHROPLASTY, HEMIARTHROPLASTY, SHOULDER, FOR ARTHRITIS  . Shoulder hemi-arthroplasty  06/05/2012    Procedure: SHOULDER HEMI-ARTHROPLASTY;  Surgeon: Johnny Bridge, MD;  Location: [place];  Service: Orthopedics;  Laterality: Right;  FOR ARTHRITIS  . Video bronchoscopy Bilateral 01/28/2015    Procedure: VIDEO BRONCHOSCOPY WITH FLUORO;  Surgeon: Collene Gobble, MD;  Location: [place];  Service: Cardiopulmonary;  Laterality: Bilateral;    Family History  Problem Relation Age of Onset  . Heart disease Mother     MI at age 67  . Lymphoma Maternal Grandmother   . Colon cancer Paternal Grandmother   . COPD Father   . Emphysema Mother   . Thyroid disease Mother     Thyroidectomy/Benign  . Cancer Brother     adenocarcinoma right lung    Allergies  Allergen Reactions  . Celecoxib   . Hydrocodone     Itching--has taken Percocet with no problem   . Prednisone Other (See Comments)    Made pt feel "crazy"  . Sulfonamide Derivatives     Current Outpatient Prescriptions on File Prior to Visit  Medication Sig Dispense Refill  . azithromycin (ZITHROMAX) 500 MG tablet Take 1 tablet (500 mg total) by mouth 3 (three) times a week. 40 tablet 6  . Calcium Carbonate-Vit D-Min (CALCIUM 1200 PO) Take 1 capsule by mouth daily.    Marland Kitchen ethambutol (MYAMBUTOL) 400 MG tablet Take 3 tablets (1,200 mg total) by mouth 3 (three) times a week. 90 tablet 5  . gabapentin (NEURONTIN) 300 MG capsule Take 1 capsule (300 mg total) by mouth 2 (two) times daily. 180 capsule 1  . losartan (COZAAR) 50 MG tablet Take 1 tablet (50 mg total) by mouth daily. 90 tablet 1  . ranitidine (ZANTAC) 150 MG capsule Take 150 mg by mouth daily.    . rifampin (RIFADIN) 300 MG capsule Take 2 capsules (600 mg  total) by mouth 3 (three) times a week. 60 capsule 6  . rosuvastatin (CRESTOR) 20 MG tablet Take 1 tablet (20 mg total) by mouth daily. 90 tablet 1  . sertraline (ZOLOFT) 100 MG tablet Take 1.5 tablets (150 mg total) by mouth daily. 135 tablet 1  . zolpidem (AMBIEN) 10 MG tablet Take 1 tablet (10 mg total) by mouth at bedtime as needed. For sleep. 90 tablet 0   No current facility-administered medications on file prior to visit.    BP 126/80 mmHg  Temp(Src) 98.8 F (37.1 C) (Oral)  Ht '5\' 6"'$  (1.676 m)  Wt 138 lb 12.8 oz (62.959 kg)  BMI 22.41 kg/m2  LMP 08/25/1992  Objective:   Physical Exam  Constitutional: She is oriented to person, place, and time. She appears well-developed and well-nourished. No distress.  Cardiovascular: Normal rate, regular rhythm, normal heart sounds and intact distal pulses.  Exam reveals no gallop and no friction rub.   No murmur heard. Pulmonary/Chest: Effort normal and breath sounds normal. No respiratory distress. She has no wheezes. She has no rales. She exhibits no tenderness.  Musculoskeletal: Normal range of motion. She exhibits no edema or tenderness.  Lymphadenopathy:    She has no cervical adenopathy.  Neurological: She is alert and oriented to person, place, and time.  Skin: Skin is warm and dry. No rash noted. She is not diaphoretic. No erythema. No pallor.  Psychiatric: She has a normal mood and affect. Her behavior is normal. Judgment and thought content normal.  Nursing note and vitals reviewed.     Assessment & Plan:  .1. Bilateral low back pain without sciatica - Ambulatory referral to Physical Therapy  Medication Reconciliation  - I am not familiar with this patient, therefore I recommend changing no medications. - We'll research tumor usage and her current prescription medications and get back to the patient.

## 2015-03-30 NOTE — Patient Instructions (Signed)
It was great meeting you today!  I will place an order for physical therapy and they will call you to schedule your appointments.

## 2015-03-30 NOTE — Progress Notes (Signed)
Pre visit review using our clinic review tool, if applicable. No additional management support is needed unless otherwise documented below in the visit note. 

## 2015-03-31 DIAGNOSIS — H9113 Presbycusis, bilateral: Secondary | ICD-10-CM | POA: Diagnosis not present

## 2015-03-31 DIAGNOSIS — H9313 Tinnitus, bilateral: Secondary | ICD-10-CM | POA: Diagnosis not present

## 2015-03-31 DIAGNOSIS — H903 Sensorineural hearing loss, bilateral: Secondary | ICD-10-CM | POA: Diagnosis not present

## 2015-04-01 ENCOUNTER — Ambulatory Visit (INDEPENDENT_AMBULATORY_CARE_PROVIDER_SITE_OTHER): Payer: Medicare Other | Admitting: Psychology

## 2015-04-01 DIAGNOSIS — M545 Low back pain: Secondary | ICD-10-CM | POA: Diagnosis not present

## 2015-04-01 DIAGNOSIS — M4316 Spondylolisthesis, lumbar region: Secondary | ICD-10-CM | POA: Diagnosis not present

## 2015-04-01 DIAGNOSIS — F4322 Adjustment disorder with anxiety: Secondary | ICD-10-CM

## 2015-04-01 DIAGNOSIS — M5432 Sciatica, left side: Secondary | ICD-10-CM | POA: Diagnosis not present

## 2015-04-01 DIAGNOSIS — F320 Major depressive disorder, single episode, mild: Secondary | ICD-10-CM

## 2015-04-01 DIAGNOSIS — M62830 Muscle spasm of back: Secondary | ICD-10-CM | POA: Diagnosis not present

## 2015-04-01 DIAGNOSIS — M9903 Segmental and somatic dysfunction of lumbar region: Secondary | ICD-10-CM | POA: Diagnosis not present

## 2015-04-02 DIAGNOSIS — M62830 Muscle spasm of back: Secondary | ICD-10-CM | POA: Diagnosis not present

## 2015-04-02 DIAGNOSIS — M545 Low back pain: Secondary | ICD-10-CM | POA: Diagnosis not present

## 2015-04-02 DIAGNOSIS — M4316 Spondylolisthesis, lumbar region: Secondary | ICD-10-CM | POA: Diagnosis not present

## 2015-04-02 DIAGNOSIS — M5432 Sciatica, left side: Secondary | ICD-10-CM | POA: Diagnosis not present

## 2015-04-02 DIAGNOSIS — M9903 Segmental and somatic dysfunction of lumbar region: Secondary | ICD-10-CM | POA: Diagnosis not present

## 2015-04-03 ENCOUNTER — Telehealth: Payer: Self-pay | Admitting: Critical Care Medicine

## 2015-04-03 NOTE — Telephone Encounter (Signed)
Spoke with pt, aware of recs.  Nothing further needed at this time.

## 2015-04-03 NOTE — Telephone Encounter (Signed)
Spoke with pt, states she has taken her abx three times weekly since last week, noticed since this morning having itchy red hives on legs, torso, neck since this morning.   Pt taking azithromycin '500mg'$ , myambutol '1200mg'$ , and rifampin '500mg'$  on Monday, Wednesday, and Friday.  Pt has not taken today's dose.  No other new meds.    Pt uses [facility] on [address].    PW please advise on recs.  Thanks!

## 2015-04-03 NOTE — Telephone Encounter (Signed)
Stop ALL antibiotics for now. Can take benadryl OTC likely either rifampin or azithromycin

## 2015-04-15 ENCOUNTER — Ambulatory Visit (INDEPENDENT_AMBULATORY_CARE_PROVIDER_SITE_OTHER): Payer: Medicare Other | Admitting: Critical Care Medicine

## 2015-04-15 ENCOUNTER — Ambulatory Visit (INDEPENDENT_AMBULATORY_CARE_PROVIDER_SITE_OTHER): Payer: Medicare Other | Admitting: Psychology

## 2015-04-15 ENCOUNTER — Encounter: Payer: Self-pay | Admitting: Critical Care Medicine

## 2015-04-15 VITALS — BP 110/62 | HR 78 | Ht 66.0 in | Wt 138.0 lb

## 2015-04-15 DIAGNOSIS — Z23 Encounter for immunization: Secondary | ICD-10-CM | POA: Diagnosis not present

## 2015-04-15 DIAGNOSIS — F4322 Adjustment disorder with anxiety: Secondary | ICD-10-CM | POA: Diagnosis not present

## 2015-04-15 DIAGNOSIS — F320 Major depressive disorder, single episode, mild: Secondary | ICD-10-CM | POA: Diagnosis not present

## 2015-04-15 DIAGNOSIS — A310 Pulmonary mycobacterial infection: Secondary | ICD-10-CM | POA: Diagnosis not present

## 2015-04-15 MED ORDER — CLARITHROMYCIN 500 MG PO TABS: ORAL_TABLET | ORAL | Status: DC

## 2015-04-15 NOTE — Progress Notes (Signed)
Subjective:    Patient ID: Sandra Craig, female    DOB: 02-02-1941, 74 y.o.   MRN: [id number]  HPI 04/15/2015 Chief Complaint  Patient presents with  . Follow-up    pt states she is doing well. pt states she had to stop ABX because of rash and itching. pt states her breathing is still ragged and her voice has gotten a little more hoarse.  pt needs  your advise on her back surgery.  Pt developed trunkal, leg neck rash, itching.    Upon further questioning the patient does reveal she had a rash to a Z-Pak several years ago  Pt is still symptomatic.  Pt needs to have back surgery.  The patient is still coughing. Pt denies any significant sore throat, nasal congestion or excess secretions, fever, chills, sweats, unintended weight loss, pleurtic or exertional chest pain, orthopnea PND, or leg swelling Pt denies any increase in rescue therapy over baseline, denies waking up needing it or having any early am or nocturnal exacerbations of coughing/wheezing/or dyspnea. Pt also denies any obvious fluctuation in symptoms with  weather or environmental change or other alleviating or aggravating factors  Current Medications, Allergies, Complete Past Medical History, Past Surgical History, Family History, and Social History were reviewed in [facility] record per todays encounter:  04/15/2015  Review of Systems  Constitutional: Negative.   HENT: Negative.  Negative for ear pain, postnasal drip, rhinorrhea, sinus pressure, sore throat, trouble swallowing and voice change.   Eyes: Negative.   Respiratory: Positive for cough, shortness of breath and wheezing. Negative for apnea, choking, chest tightness and stridor.   Cardiovascular: Negative.  Negative for chest pain, palpitations and leg swelling.  Gastrointestinal: Negative.  Negative for nausea, vomiting, abdominal pain and abdominal distention.  Genitourinary: Negative.   Musculoskeletal: Negative.  Negative for myalgias and  arthralgias.  Skin: Positive for rash.  Allergic/Immunologic: Negative.  Negative for environmental allergies and food allergies.  Neurological: Negative.  Negative for dizziness, syncope, weakness and headaches.  Hematological: Negative.  Negative for adenopathy. Does not bruise/bleed easily.  Psychiatric/Behavioral: Negative.  Negative for sleep disturbance and agitation. The patient is not nervous/anxious.        Objective:   Physical Exam Filed Vitals:   04/15/15 1106  BP: 110/62  Pulse: 78  Height: '5\' 6"'$  (1.676 m)  Weight: 138 lb (62.596 kg)  SpO2: 95%    Gen: Pleasant, well-nourished, in no distress,  normal affect  ENT: No lesions,  mouth clear,  oropharynx clear, no postnasal drip  Neck: No JVD, no TMG, no carotid bruits  Lungs: No use of accessory muscles, no dullness to percussion, few expiratory squeaks right upper lobe   Cardiovascular: RRR, heart sounds normal, no murmur or gallops, no peripheral edema  Abdomen: soft and NT, no HSM,  BS normal  Musculoskeletal: No deformities, no cyanosis or clubbing  Neuro: alert, non focal  Skin: Warm, no lesions or rashes  No results found.        Assessment & Plan:  I personally reviewed all images and lab data in the [facility] system as well as any outside material available during this office visit and agree with the  radiology impressions.   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 The patient had positive bronchial washing cultures for nontuberculous Mycobacterium. The patient started rifampin, ethambutol, azithromycin thrice weekly 03/16/2015. The patient then noted 2 weeks  later a diffuse pruritic rash. She now reveals to me that she had a rash with azithromycin in the past but did not reveal this wafer started this therapy. Therefore it appears azithromycin is the culprit. The patient is still  symptomatic with Mycobacterium infection in lung. Plan Discontinue further azithromycin and placed this is a high level allergy on the patient's allergy list Replace azithromycin with Biaxin 500 mg thrice weekly and resume ethambutol and rifampin thrice weekly The patient will return in October in order to establish with Dr. Roselie Awkward for further pulmonary follow-up. I did describe to the patient there was an opportunity for a research trial should she not tolerate this current medication profile   Sandra Craig was seen today for follow-up.  Diagnoses and all orders for this visit:  MAI (mycobacterium avium-intracellulare)  Encounter for immunization  MAI (mycobacterium avium-intracellulare) infection  Other orders -     clarithromycin (BIAXIN) 500 MG tablet; One three times weekly -     Flu Vaccine QUAD 36+ mos IM    I had an extended discussion with the patient and or family lasting 10 minutes of a 25 minute visit including:  Indication and need to stop azithromycin because of adverse reaction

## 2015-04-15 NOTE — Patient Instructions (Signed)
Stop azithromycin  Start biaxin '500mg'$  three times weekly  Resume rifampin '600mg'$  three times weekly Resume ethambutol '400mg'$  : 3 three times weekly Flu vaccine was given Return end October with Dr Lake Bells

## 2015-04-17 NOTE — Assessment & Plan Note (Signed)
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 The patient had positive bronchial washing cultures for nontuberculous Mycobacterium. The patient started rifampin, ethambutol, azithromycin thrice weekly 03/16/2015. The patient then noted 2 weeks later a diffuse pruritic rash. She now reveals to me that she had a rash with azithromycin in the past but did not reveal this wafer started this therapy. Therefore it appears azithromycin is the culprit. The patient is still symptomatic with Mycobacterium infection in lung. Plan Discontinue further azithromycin and placed this is a high level allergy on the patient's allergy list Replace azithromycin with Biaxin 500 mg thrice weekly and resume ethambutol and rifampin thrice weekly The patient will return in October in order to establish with Dr. Roselie Awkward for further pulmonary follow-up. I did describe to the patient there was an opportunity for a research trial should she not tolerate this current medication profile

## 2015-04-21 DIAGNOSIS — M545 Low back pain: Secondary | ICD-10-CM | POA: Diagnosis not present

## 2015-04-21 DIAGNOSIS — M4310 Spondylolisthesis, site unspecified: Secondary | ICD-10-CM | POA: Diagnosis not present

## 2015-04-22 ENCOUNTER — Ambulatory Visit (INDEPENDENT_AMBULATORY_CARE_PROVIDER_SITE_OTHER): Payer: Medicare Other | Admitting: Psychology

## 2015-04-22 DIAGNOSIS — F320 Major depressive disorder, single episode, mild: Secondary | ICD-10-CM | POA: Diagnosis not present

## 2015-04-22 DIAGNOSIS — F4322 Adjustment disorder with anxiety: Secondary | ICD-10-CM | POA: Diagnosis not present

## 2015-04-23 DIAGNOSIS — M545 Low back pain: Secondary | ICD-10-CM | POA: Diagnosis not present

## 2015-04-23 DIAGNOSIS — M4310 Spondylolisthesis, site unspecified: Secondary | ICD-10-CM | POA: Diagnosis not present

## 2015-04-28 DIAGNOSIS — M545 Low back pain: Secondary | ICD-10-CM | POA: Diagnosis not present

## 2015-04-28 DIAGNOSIS — M4310 Spondylolisthesis, site unspecified: Secondary | ICD-10-CM | POA: Diagnosis not present

## 2015-04-29 ENCOUNTER — Ambulatory Visit (INDEPENDENT_AMBULATORY_CARE_PROVIDER_SITE_OTHER): Payer: Medicare Other | Admitting: Psychology

## 2015-04-29 DIAGNOSIS — F4322 Adjustment disorder with anxiety: Secondary | ICD-10-CM | POA: Diagnosis not present

## 2015-04-29 DIAGNOSIS — F320 Major depressive disorder, single episode, mild: Secondary | ICD-10-CM | POA: Diagnosis not present

## 2015-04-30 DIAGNOSIS — M4310 Spondylolisthesis, site unspecified: Secondary | ICD-10-CM | POA: Diagnosis not present

## 2015-04-30 DIAGNOSIS — M545 Low back pain: Secondary | ICD-10-CM | POA: Diagnosis not present

## 2015-05-07 DIAGNOSIS — M4310 Spondylolisthesis, site unspecified: Secondary | ICD-10-CM | POA: Diagnosis not present

## 2015-05-07 DIAGNOSIS — M545 Low back pain: Secondary | ICD-10-CM | POA: Diagnosis not present

## 2015-05-12 DIAGNOSIS — H10503 Unspecified blepharoconjunctivitis, bilateral: Secondary | ICD-10-CM | POA: Diagnosis not present

## 2015-05-13 ENCOUNTER — Ambulatory Visit (INDEPENDENT_AMBULATORY_CARE_PROVIDER_SITE_OTHER): Payer: Medicare Other | Admitting: Psychology

## 2015-05-13 DIAGNOSIS — F320 Major depressive disorder, single episode, mild: Secondary | ICD-10-CM

## 2015-05-13 DIAGNOSIS — F4322 Adjustment disorder with anxiety: Secondary | ICD-10-CM

## 2015-05-18 ENCOUNTER — Telehealth: Payer: Self-pay | Admitting: Pulmonary Disease

## 2015-05-18 DIAGNOSIS — M5416 Radiculopathy, lumbar region: Secondary | ICD-10-CM | POA: Diagnosis not present

## 2015-05-18 DIAGNOSIS — M25552 Pain in left hip: Secondary | ICD-10-CM | POA: Diagnosis not present

## 2015-05-18 DIAGNOSIS — M545 Low back pain: Secondary | ICD-10-CM | POA: Diagnosis not present

## 2015-05-18 NOTE — Telephone Encounter (Signed)
I called made pt aware. Nothing further needed 

## 2015-05-18 NOTE — Telephone Encounter (Signed)
Called spoke w/ pt. Her ortho doc wants to give her steroid injection in her hip on Monday but was told needed to make sure Dr. Lake Bells was okay with this since she is on ABX for [facility]. Pt did not know the name of the injection. Please advise thanks

## 2015-05-18 NOTE — Telephone Encounter (Signed)
Yes, that is fine. 

## 2015-05-22 ENCOUNTER — Telehealth: Payer: Self-pay | Admitting: Internal Medicine

## 2015-05-22 MED ORDER — ZOLPIDEM TARTRATE 10 MG PO TABS: 10.0000 mg | ORAL_TABLET | Freq: Every evening | ORAL | Status: DC | PRN

## 2015-05-22 NOTE — Telephone Encounter (Signed)
Patient needs her refill of Ambien sent to [facility]. She has a follow up appt with Tommi Rumps on 11/09 since Dr Shawna Orleans is out of the office, Pt says she is out of Ambien and [facility] has sent 2 refill requests. She is currently driving back from [place] to get her Rx filled this afternoon. Please send in today.

## 2015-05-22 NOTE — Telephone Encounter (Signed)
Refill called in. 

## 2015-05-25 ENCOUNTER — Telehealth: Payer: Self-pay | Admitting: Internal Medicine

## 2015-05-25 DIAGNOSIS — M25552 Pain in left hip: Secondary | ICD-10-CM | POA: Diagnosis not present

## 2015-05-25 NOTE — Telephone Encounter (Signed)
[facility] Night - Client TELEPHONE [place] [facility] Patient Name: Sandra Craig Gender: Female DOB: 12/18/1940 Age: 74 Y 19 D Return Phone Number: [phone] (Primary) Address: City/State/Zip: [place] Client [facility] Night - Client Client Site [place] - Night Physician Shawna Orleans, Doe Kingsley Plan "Rob" Contact Type Call Call Type Triage / Clinical Caller Name [name] Relationship To Patient Self Return Phone Number [phone] (Primary) Chief Complaint Prescription Refill or Medication Request (non symptomatic) Initial Comment Caller Sandra Craig 07-Jun-1941 she called earlier today. She is out of Ambien office said she would call [facility] to fill it early. Nurse Assessment Nurse: Denyse Amass, RN, Benjamine Mola Date/Time (Eastern Time): 05/22/2015 7:13:15 PM Please select the assessment type ---Refill Additional Documentation ---Patient states she called the office this afternoon for a refill on her Ambien and the nurse said she would call in the Rx to [facility]. Patient checked with [facility] and they said it was too early for her to get a refill. [facility] said they can not refill the Rx until June 03, 2015 Does the patient have enough medication to last until the office opens? ---Unable to obtain loaner dose from Pharmacy Does the client directives allow for assistance with medications after hours? ---No Additional Documentation ---Patient instructed to call the office on Monday. Guidelines Guideline Title Affirmed Question Affirmed Notes Nurse Date/Time (Eastern Time) Disp. Time Eilene Ghazi Time) Disposition Final User 05/22/2015 7:18:57 PM Clinical Call Yes [place], RN, Benjamine Mola After Care Instructions Given Call Event Type User Date / Time Description

## 2015-05-25 NOTE — Telephone Encounter (Signed)
Pt said [facility] said the office need to call and say that she can pick up her Ambien

## 2015-05-25 NOTE — Telephone Encounter (Signed)
Pt needs early refill not due until 06-02-15

## 2015-05-26 MED ORDER — ZOLPIDEM TARTRATE 10 MG PO TABS: 10.0000 mg | ORAL_TABLET | Freq: Every evening | ORAL | Status: DC | PRN

## 2015-05-26 NOTE — Telephone Encounter (Signed)
Letter faxed and patient is aware 

## 2015-05-26 NOTE — Telephone Encounter (Signed)
Pharmacy called and said the letter that was sent has Ambien 165m, but the prescription is for 162m so they will need a new letter requesting the Ambien 1069mMarland Kitchen

## 2015-05-26 NOTE — Telephone Encounter (Signed)
re-faxed

## 2015-05-26 NOTE — Telephone Encounter (Signed)
Per pharmacist at [facility] patient states that her prescription was stolen.  They requested a police report and the patient declined. Okay Per Dr Shawna Orleans to fill.

## 2015-05-28 ENCOUNTER — Encounter: Payer: Self-pay | Admitting: Pulmonary Disease

## 2015-05-28 ENCOUNTER — Other Ambulatory Visit (INDEPENDENT_AMBULATORY_CARE_PROVIDER_SITE_OTHER): Payer: Medicare Other

## 2015-05-28 ENCOUNTER — Ambulatory Visit (INDEPENDENT_AMBULATORY_CARE_PROVIDER_SITE_OTHER): Payer: Medicare Other | Admitting: Pulmonary Disease

## 2015-05-28 VITALS — BP 122/82 | HR 68 | Ht 66.0 in | Wt 137.0 lb

## 2015-05-28 DIAGNOSIS — A310 Pulmonary mycobacterial infection: Secondary | ICD-10-CM | POA: Diagnosis not present

## 2015-05-28 DIAGNOSIS — Z5181 Encounter for therapeutic drug level monitoring: Secondary | ICD-10-CM

## 2015-05-28 LAB — HEPATIC FUNCTION PANEL
ALT: 14 U/L (ref 0–35)
AST: 18 U/L (ref 0–37)
Albumin: 4.3 g/dL (ref 3.5–5.2)
Alkaline Phosphatase: 56 U/L (ref 39–117)
Bilirubin, Direct: 0.1 mg/dL (ref 0.0–0.3)
Total Bilirubin: 0.4 mg/dL (ref 0.2–1.2)
Total Protein: 7.4 g/dL (ref 6.0–8.3)

## 2015-05-28 NOTE — Patient Instructions (Signed)
We will call you with the results of today's blood work Keep taking the 3 antibiotics as you're doing We will see you back in 3 months or sooner if needed

## 2015-05-28 NOTE — Assessment & Plan Note (Signed)
She has been doing well with 3 drug therapy (clarithromycin is being used instead of azithromycin).   Plan: Continue 3 drug therapy At this point her symptoms are minimal when she is not producing mucus. Traditionally I would sample her mucus now to see if she is still growing out the organism, but that's not going to be possible considering her lack of mucus production. So we will plan on treating her through August 2017.  We will also sample her blood today to look for evidence of liver function abnormalities in the setting of 3 drug therapy.  Follow-up 3 months

## 2015-05-28 NOTE — Progress Notes (Signed)
Subjective:    Patient ID: Sandra Craig, female    DOB: October 08, 1940, 74 y.o.   MRN: [id number]  Synopsis: Former patient of Dr. 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  She was initially followed for a pulmonary nodule in 2008. She used to smoke and quit in 1977 after ten years.  < 1 ppd.  She was diagnosed with a bronchoscopy in June 2016. A CT scan in 2016 showed characteristic tree in bud abnormalities which were worrisome for Mycobacterium avium intercellular. She started 3 drug therapy in August 2016, but had to stop azithromycin after 2 weeks because of a rash. Started clarithomycin.   HPI Chief Complaint  Patient presents with  . Follow-up    Former PW pt here to establish care. Pt c/o occasional cough with clear mucus. Pt denies wheeze/SOB/CP/tightness.    Sandra Craig was initially seen by our office for a pulmonary nodule in 2008.  However in 2016 she ended up having a CXR when she was sick with a diverticular problem.  This CXR showed increasing nodularity.  At the time she had a cough, "smothering", and a feeling of chest congestion.  No weight loss, fevers no chills.  She did have night sweats as well.   She has been doing well lately.  She is having a lot of back and joint pain.  No trouble breathing lately.  Cough is gone, no dyspnea. Night sweats are still present.  She used to smoke and quit in 1977 after ten years.  < 1 ppd.   Past Medical History  Diagnosis Date  . IBS (irritable bowel syndrome)   . Hypertension   . Hyperlipidemia   . IC (interstitial cystitis)   . Endometriosis   . Polycystic kidney disease   . Osteopenia   . Cystitis   . Fibromyalgia   . Palpitations   . Chronic insomnia   . Pulmonary nodule 12/08    5 mm Anterior RUL  . Hiatal hernia   . Diverticulosis of colon (without mention of hemorrhage)   . Internal hemorrhoid   . Family history of malignant neoplasm of gastrointestinal tract   . Gastritis   . Small bowel obstruction ([place])     . Osteonecrosis ([place])   . History of gallstones   . Diverticulitis of intestine without perforation or abscess without bleeding     Patient did have abscess but noperforation  . 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 Osteoporosis       Review of Systems     Objective:   Physical Exam Filed Vitals:   05/28/15 1134  BP: 122/82  Pulse: 68  Height: '5\' 6"'$  (1.676 m)  Weight: 137 lb (62.143 kg)  SpO2: 97%    RA  Gen: well appearing HENT: OP clear, TM's clear, neck supple PULM: CTA B, normal percussion CV: RRR, no mgr, trace edema GI: BS+, soft, nontender Derm: no cyanosis or rash Psyche: normal mood and affect       Assessment & Plan:  MAI (mycobacterium avium-intracellulare) infection (HCC) She has been doing well with 3 drug therapy (clarithromycin is being used instead of azithromycin).   Plan: Continue 3 drug therapy At this point her symptoms are minimal when she is not producing mucus. Traditionally I would sample her mucus now to see if she is still growing out the organism, but that's not going to be possible considering her lack of mucus production. So we will plan on treating her through August 2017.  We will also sample her blood today to look for evidence of liver function abnormalities in the setting of 3 drug therapy.  Follow-up 3 months     Current outpatient prescriptions:  .  Calcium Carbonate-Vit D-Min (CALCIUM 1200 PO), Take 1 capsule by mouth daily., Disp: , Rfl:  .  clarithromycin (BIAXIN) 500 MG tablet, One three times weekly, Disp: 40 tablet, Rfl: 4 .  ethambutol (MYAMBUTOL) 400 MG tablet, Take 3 tablets (1,200 mg total) by mouth 3 (three) times a week., Disp: 90 tablet, Rfl: 5 .  gabapentin (NEURONTIN) 300 MG capsule, Take 1 capsule (300 mg total) by mouth 2 (two) times daily., Disp: 180 capsule, Rfl: 1 .  HYDROcodone-acetaminophen (NORCO/VICODIN) 5-325 MG per tablet, TK 1 T PO EVERY 8 HOURS AS NEEDED FOR PAIN, Disp: , Rfl:  0 .  losartan (COZAAR) 50 MG tablet, Take 1 tablet (50 mg total) by mouth daily., Disp: 90 tablet, Rfl: 1 .  ranitidine (ZANTAC) 150 MG capsule, Take 150 mg by mouth daily., Disp: , Rfl:  .  rifampin (RIFADIN) 300 MG capsule, Take 2 capsules (600 mg total) by mouth 3 (three) times a week., Disp: 60 capsule, Rfl: 6 .  rosuvastatin (CRESTOR) 20 MG tablet, Take 1 tablet (20 mg total) by mouth daily., Disp: 90 tablet, Rfl: 1 .  sertraline (ZOLOFT) 100 MG tablet, Take 1.5 tablets (150 mg total) by mouth daily., Disp: 135 tablet, Rfl: 1 .  zolpidem (AMBIEN) 10 MG tablet, Take 1 tablet (10 mg total) by mouth at bedtime as needed. For sleep., Disp: 90 tablet, Rfl: 0

## 2015-05-29 NOTE — Progress Notes (Signed)
Quick Note:  Contacted pt with lab results per DQ Pt expressed understanding, nothing further needed. ______

## 2015-06-03 ENCOUNTER — Encounter: Payer: Self-pay | Admitting: Pulmonary Disease

## 2015-06-03 ENCOUNTER — Ambulatory Visit: Payer: Self-pay | Admitting: Internal Medicine

## 2015-06-03 ENCOUNTER — Ambulatory Visit: Payer: Medicare Other | Admitting: Psychology

## 2015-06-03 NOTE — Telephone Encounter (Signed)
11.2.16 mychart message from patient: Message     Thank you for your time last Thursday. It was a pleasure meeting you and I look forward to your being my pulmonologist. We discussed some other issues i have that Dr Shawna Orleans has followed for many years. I so hope he will recover completely and return soon. In the meantime, please tell me again the internist you suggested that I begin seeing. I had a three month follow up with Dr Shawna Orleans that's been rescheduled with his NP for 11/9.   Pt last seen 10.27.16 by BQ to establish care from PW: Patient Instructions     We will call you with the results of today's blood work Keep taking the 3 antibiotics as you're doing We will see you back in 3 months or sooner if needed   No mention in note about recommended Internist BQ is in the office tomorrow 11/3 Please advise your recs, thank you.

## 2015-06-04 NOTE — Telephone Encounter (Signed)
Per BQ: Message     Dr. Garret Reddish   Email sent to patient

## 2015-06-10 ENCOUNTER — Encounter: Payer: Self-pay | Admitting: Adult Health

## 2015-06-10 ENCOUNTER — Ambulatory Visit (INDEPENDENT_AMBULATORY_CARE_PROVIDER_SITE_OTHER): Payer: Medicare Other | Admitting: Adult Health

## 2015-06-10 VITALS — BP 130/80 | HR 82 | Temp 97.7°F | Wt 137.0 lb

## 2015-06-10 DIAGNOSIS — Q613 Polycystic kidney, unspecified: Secondary | ICD-10-CM | POA: Diagnosis not present

## 2015-06-10 DIAGNOSIS — Z09 Encounter for follow-up examination after completed treatment for conditions other than malignant neoplasm: Secondary | ICD-10-CM

## 2015-06-10 LAB — POCT URINALYSIS DIPSTICK
Bilirubin, UA: NEGATIVE
Glucose, UA: NEGATIVE
Ketones, UA: NEGATIVE
Leukocytes, UA: NEGATIVE
Nitrite, UA: NEGATIVE
Protein, UA: NEGATIVE
Spec Grav, UA: 1.01
Urobilinogen, UA: 0.2
pH, UA: 5.5

## 2015-06-10 NOTE — Progress Notes (Signed)
Subjective:    Patient ID: Sandra Craig, female    DOB: 03/09/1941, 74 y.o.   MRN: [id number]  HPI  74 year old female, patient of Dr. Shawna Orleans, who is in for three month check up. She is being seen by Pulmonology for  Mycobacterium avium intracellular and continues to be on three drug therapy. Her next follow up appointment is in January.   She is worried that the antibiotics she is on are hurting her kidneys. She has a history of Polycystic Kidney Disease. Her last GFR in August of 2015 was 38.56 , Cr 1.42 and BUN 25. She does endorse that she feels like she is urinating less often.   Overall she knows that she is doing well, but states " these antibiotics make me feel run down."  Per patient she is hoping that she only has to take them for another three months.   Review of Systems  Constitutional: Negative.   Genitourinary: Positive for decreased urine volume. Negative for urgency, frequency and flank pain.   Past Medical History  Diagnosis Date  . IBS (irritable bowel syndrome)   . Hypertension   . Hyperlipidemia   . IC (interstitial cystitis)   . Endometriosis   . Polycystic kidney disease   . Osteopenia   . Cystitis   . Fibromyalgia   . Palpitations   . Chronic insomnia   . Pulmonary nodule 12/08    5 mm Anterior RUL  . Hiatal hernia   . Diverticulosis of colon (without mention of hemorrhage)   . Internal hemorrhoid   . Family history of malignant neoplasm of gastrointestinal tract   . Gastritis   . Small bowel obstruction ([place])   . Osteonecrosis ([place])   . History of gallstones   . Diverticulitis of intestine without perforation or abscess without bleeding     Patient did have abscess but noperforation  . 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 Osteoporosis     Social History   Social History  . Marital Status: Widowed    Spouse Name: N/A  . Number of Children: N/A  . Years of Education: N/A   Occupational History  . Retired     [employer]   Social History Main Topics  . Smoking status: Former Smoker -- 0.75 packs/day for 8 years    Types: Cigarettes    Quit date: 08/01/1976  . Smokeless tobacco: Never Used     Comment: Quit in 1978  . Alcohol Use: No  . Drug Use: No  . Sexual Activity: No   Other Topics Concern  . Not on file   Social History Narrative    Past Surgical History  Procedure Laterality Date  . Cholecystectomy    . Pelvic laparoscopy    . Abdominal hysterectomy  1994    TAH,BSO FOR ENDOMETRIOSIS  . Oophorectomy  1994    TAH,BSO  . Total hip arthroplasty  FALL OF 2008    rt. partial hip replacement  . Abdominal surgery  2011    small intestine blockage  . S/p right shoulder rotater cuff  200216/2011    Tear/adhesive capsulitis  . Sbo lap  11    Adhesions and small internal hernia  . Joint replacement  2008  . Gastroplasty  2011    small bowel resection -open  . Total shoulder arthroplasty  06/05/2012    Procedure: TOTAL SHOULDER ARTHROPLASTY;  Surgeon: Johnny Bridge, MD;  Location: [place];  Service: Orthopedics;  Laterality: Right;  RIGHT SHOULDER TOTAL ARTHROPLASTY, HEMIARTHROPLASTY, SHOULDER, FOR ARTHRITIS  . Shoulder hemi-arthroplasty  06/05/2012    Procedure: SHOULDER HEMI-ARTHROPLASTY;  Surgeon: Johnny Bridge, MD;  Location: [place];  Service: Orthopedics;  Laterality: Right;  FOR ARTHRITIS  . Video bronchoscopy Bilateral 01/28/2015    Procedure: VIDEO BRONCHOSCOPY WITH FLUORO;  Surgeon: Collene Gobble, MD;  Location: [place];  Service: Cardiopulmonary;  Laterality: Bilateral;    Family History  Problem Relation Age of Onset  . Heart disease Mother     MI at age 77  . Lymphoma Maternal Grandmother   . Colon cancer Paternal Grandmother   . COPD Father   . Emphysema Mother   . Thyroid disease Mother     Thyroidectomy/Benign  . Cancer Brother     adenocarcinoma right lung    Allergies  Allergen Reactions  . Azithromycin Rash    Pruritic rash diffuse   .  Celecoxib   . Prednisone Other (See Comments)    Made pt feel "crazy"  . Sulfonamide Derivatives     Current Outpatient Prescriptions on File Prior to Visit  Medication Sig Dispense Refill  . Calcium Carbonate-Vit D-Min (CALCIUM 1200 PO) Take 1 capsule by mouth daily.    . clarithromycin (BIAXIN) 500 MG tablet One three times weekly 40 tablet 4  . ethambutol (MYAMBUTOL) 400 MG tablet Take 3 tablets (1,200 mg total) by mouth 3 (three) times a week. 90 tablet 5  . gabapentin (NEURONTIN) 300 MG capsule Take 1 capsule (300 mg total) by mouth 2 (two) times daily. 180 capsule 1  . HYDROcodone-acetaminophen (NORCO/VICODIN) 5-325 MG per tablet TK 1 T PO EVERY 8 HOURS AS NEEDED FOR PAIN  0  . losartan (COZAAR) 50 MG tablet Take 1 tablet (50 mg total) by mouth daily. 90 tablet 1  . ranitidine (ZANTAC) 150 MG capsule Take 150 mg by mouth daily.    . rifampin (RIFADIN) 300 MG capsule Take 2 capsules (600 mg total) by mouth 3 (three) times a week. 60 capsule 6  . rosuvastatin (CRESTOR) 20 MG tablet Take 1 tablet (20 mg total) by mouth daily. 90 tablet 1  . sertraline (ZOLOFT) 100 MG tablet Take 1.5 tablets (150 mg total) by mouth daily. 135 tablet 1  . zolpidem (AMBIEN) 10 MG tablet Take 1 tablet (10 mg total) by mouth at bedtime as needed. For sleep. 90 tablet 0   No current facility-administered medications on file prior to visit.    BP 130/80 mmHg  Pulse 82  Temp(Src) 97.7 F (36.5 C) (Oral)  Wt 137 lb (62.143 kg)  LMP 08/25/1992       Objective:   Physical Exam  Constitutional: She is oriented to person, place, and time. She appears well-developed and well-nourished. No distress.  Cardiovascular: Normal rate, regular rhythm, normal heart sounds and intact distal pulses.  Exam reveals no gallop and no friction rub.   No murmur heard. Pulmonary/Chest: Effort normal and breath sounds normal. No respiratory distress. She has no wheezes. She has no rales. She exhibits no tenderness.    Abdominal: Soft. Bowel sounds are normal. She exhibits no distension and no mass. There is tenderness (chronic from surgery related to bowel obstruction). There is no rebound and no guarding.  Neurological: She is alert and oriented to person, place, and time. She has normal reflexes.  Skin: Skin is warm and dry. No rash noted. She is not diaphoretic. No erythema. No pallor.  Psychiatric: She has a normal mood and  affect. Her behavior is normal. Thought content normal.  Nursing note and vitals reviewed.      Assessment & Plan:  1. Follow up - BMP with eGFR - POC Urinalysis Dipstick - Consider referral to Nephrology - Follow up as needed

## 2015-06-10 NOTE — Progress Notes (Signed)
Pre visit review using our clinic review tool, if applicable. No additional management support is needed unless otherwise documented below in the visit note. 

## 2015-06-10 NOTE — Patient Instructions (Signed)
It was great seeing you again!  I will follow up with you regarding your labs.   If you need anything, please let me know.

## 2015-06-11 LAB — BASIC METABOLIC PANEL WITH GFR
BUN: 26 mg/dL — ABNORMAL HIGH (ref 7–25)
CO2: 28 mmol/L (ref 20–31)
Calcium: 9.3 mg/dL (ref 8.6–10.4)
Chloride: 108 mmol/L (ref 98–110)
Creat: 1.33 mg/dL — ABNORMAL HIGH (ref 0.60–0.93)
GFR, Est African American: 46 mL/min — ABNORMAL LOW (ref >=60)
GFR, Est Non African American: 40 mL/min — ABNORMAL LOW (ref >=60)
Glucose, Bld: 87 mg/dL (ref 65–99)
Potassium: 4.6 mmol/L (ref 3.5–5.3)
Sodium: 143 mmol/L (ref 135–146)

## 2015-06-16 DIAGNOSIS — M5416 Radiculopathy, lumbar region: Secondary | ICD-10-CM | POA: Diagnosis not present

## 2015-06-16 DIAGNOSIS — M545 Low back pain: Secondary | ICD-10-CM | POA: Diagnosis not present

## 2015-06-17 ENCOUNTER — Ambulatory Visit (INDEPENDENT_AMBULATORY_CARE_PROVIDER_SITE_OTHER): Payer: Medicare Other | Admitting: Psychology

## 2015-06-17 DIAGNOSIS — F4322 Adjustment disorder with anxiety: Secondary | ICD-10-CM

## 2015-06-17 DIAGNOSIS — F320 Major depressive disorder, single episode, mild: Secondary | ICD-10-CM

## 2015-06-19 DIAGNOSIS — Z79899 Other long term (current) drug therapy: Secondary | ICD-10-CM | POA: Diagnosis not present

## 2015-06-24 DIAGNOSIS — M545 Low back pain: Secondary | ICD-10-CM | POA: Diagnosis not present

## 2015-06-24 DIAGNOSIS — M5416 Radiculopathy, lumbar region: Secondary | ICD-10-CM | POA: Diagnosis not present

## 2015-07-09 ENCOUNTER — Ambulatory Visit (INDEPENDENT_AMBULATORY_CARE_PROVIDER_SITE_OTHER): Payer: Medicare Other | Admitting: Women's Health

## 2015-07-09 ENCOUNTER — Encounter: Payer: Self-pay | Admitting: Women's Health

## 2015-07-09 VITALS — BP 118/80 | Ht 66.0 in | Wt 139.0 lb

## 2015-07-09 DIAGNOSIS — M545 Low back pain, unspecified: Secondary | ICD-10-CM

## 2015-07-09 DIAGNOSIS — M810 Age-related osteoporosis without current pathological fracture: Secondary | ICD-10-CM | POA: Diagnosis not present

## 2015-07-09 DIAGNOSIS — Z01419 Encounter for gynecological examination (general) (routine) without abnormal findings: Secondary | ICD-10-CM

## 2015-07-09 MED ORDER — HYDROCODONE-ACETAMINOPHEN 5-325 MG PO TABS: ORAL_TABLET | ORAL | Status: DC

## 2015-07-09 NOTE — Patient Instructions (Signed)
Back Pain, Adult °Back pain is very common in adults. The cause of back pain is rarely dangerous and the pain often gets better over time. The cause of your back pain may not be known. Some common causes of back pain include: °· Strain of the muscles or ligaments supporting the spine. °· Wear and tear (degeneration) of the spinal disks. °· Arthritis. °· Direct injury to the back. °For many people, back pain may return. Since back pain is rarely dangerous, most people can learn to manage this condition on their own. °HOME CARE INSTRUCTIONS °Watch your back pain for any changes. The following actions may help to lessen any discomfort you are feeling: °· Remain active. It is stressful on your back to sit or stand in one place for long periods of time. Do not sit, drive, or stand in one place for more than 30 minutes at a time. Take short walks on even surfaces as soon as you are able. Try to increase the length of time you walk each day. °· Exercise regularly as directed by your health care provider. Exercise helps your back heal faster. It also helps avoid future injury by keeping your muscles strong and flexible. °· Do not stay in bed. Resting more than 1-2 days can delay your recovery. °· Pay attention to your body when you bend and lift. The most comfortable positions are those that put less stress on your recovering back. Always use proper lifting techniques, including: °· Bending your knees. °· Keeping the load close to your body. °· Avoiding twisting. °· Find a comfortable position to sleep. Use a firm mattress and lie on your side with your knees slightly bent. If you lie on your back, put a pillow under your knees. °· Avoid feeling anxious or stressed. Stress increases muscle tension and can worsen back pain. It is important to recognize when you are anxious or stressed and learn ways to manage it, such as with exercise. °· Take medicines only as directed by your health care provider. Over-the-counter  medicines to reduce pain and inflammation are often the most helpful. Your health care provider may prescribe muscle relaxant drugs. These medicines help dull your pain so you can more quickly return to your normal activities and healthy exercise. °· Apply ice to the injured area: °· Put ice in a plastic bag. °· Place a towel between your skin and the bag. °· Leave the ice on for 20 minutes, 2-3 times a day for the first 2-3 days. After that, ice and heat may be alternated to reduce pain and spasms. °· Maintain a healthy weight. Excess weight puts extra stress on your back and makes it difficult to maintain good posture. °SEEK MEDICAL CARE IF: °· You have pain that is not relieved with rest or medicine. °· You have increasing pain going down into the legs or buttocks. °· You have pain that does not improve in one week. °· You have night pain. °· You lose weight. °· You have a fever or chills. °SEEK IMMEDIATE MEDICAL CARE IF:  °· You develop new bowel or bladder control problems. °· You have unusual weakness or numbness in your arms or legs. °· You develop nausea or vomiting. °· You develop abdominal pain. °· You feel faint. °  °This information is not intended to replace advice given to you by your health care provider. Make sure you discuss any questions you have with your health care provider. °  °Document Released: 07/18/2005 Document Revised: 08/08/2014 Document Reviewed: 11/19/2013 °Elsevier Interactive Patient Education ©2016 Else[employer]   Inc. Menopause is a normal process in which your reproductive ability comes to an end. This process happens gradually over a span of months to years, usually between the ages of 32 and 80. Menopause is complete when you have missed 12 consecutive menstrual periods. It is important to talk with your health care provider about some of the most common conditions that affect postmenopausal women, such as heart disease, cancer, and bone loss (osteoporosis). Adopting a healthy  lifestyle and getting preventive care can help to promote your health and wellness. Those actions can also lower your chances of developing some of these common conditions. WHAT SHOULD I KNOW ABOUT MENOPAUSE? During menopause, you may experience a number of symptoms, such as:  Moderate-to-severe hot flashes.  Night sweats.  Decrease in sex drive.  Mood swings.  Headaches.  Tiredness.  Irritability.  Memory problems.  Insomnia. Choosing to treat or not to treat menopausal changes is an individual decision that you make with your health care provider. WHAT SHOULD I KNOW ABOUT HORMONE REPLACEMENT THERAPY AND SUPPLEMENTS? Hormone therapy products are effective for treating symptoms that are associated with menopause, such as hot flashes and night sweats. Hormone replacement carries certain risks, especially as you become older. If you are thinking about using estrogen or estrogen with progestin treatments, discuss the benefits and risks with your health care provider. WHAT SHOULD I KNOW ABOUT HEART DISEASE AND STROKE? Heart disease, heart attack, and stroke become more likely as you age. This may be due, in part, to the hormonal changes that your body experiences during menopause. These can affect how your body processes dietary fats, triglycerides, and cholesterol. Heart attack and stroke are both medical emergencies. There are many things that you can do to help prevent heart disease and stroke:  Have your blood pressure checked at least every 1-2 years. High blood pressure causes heart disease and increases the risk of stroke.  If you are 72-106 years old, ask your health care provider if you should take aspirin to prevent a heart attack or a stroke.  Do not use any tobacco products, including cigarettes, chewing tobacco, or electronic cigarettes. If you need help quitting, ask your health care provider.  It is important to eat a healthy diet and maintain a healthy weight.  Be sure  to include plenty of vegetables, fruits, low-fat dairy products, and lean protein.  Avoid eating foods that are high in solid fats, added sugars, or salt (sodium).  Get regular exercise. This is one of the most important things that you can do for your health.  Try to exercise for at least 150 minutes each week. The type of exercise that you do should increase your heart rate and make you sweat. This is known as moderate-intensity exercise.  Try to do strengthening exercises at least twice each week. Do these in addition to the moderate-intensity exercise.  Know your numbers.Ask your health care provider to check your cholesterol and your blood glucose. Continue to have your blood tested as directed by your health care provider. WHAT SHOULD I KNOW ABOUT CANCER SCREENING? There are several types of cancer. Take the following steps to reduce your risk and to catch any cancer development as early as possible. Breast Cancer  Practice breast self-awareness.  This means understanding how your breasts normally appear and feel.  It also means doing regular breast self-exams. Let your health care provider know about any changes, no matter how small.  If you are 25 or older, have a clinician  do a breast exam (clinical breast exam or CBE) every year. Depending on your age, family history, and medical history, it may be recommended that you also have a yearly breast X-ray (mammogram).  If you have a family history of breast cancer, talk with your health care provider about genetic screening.  If you are at high risk for breast cancer, talk with your health care provider about having an MRI and a mammogram every year.  Breast cancer (BRCA) gene test is recommended for women who have family members with BRCA-related cancers. Results of the assessment will determine the need for genetic counseling and BRCA1 and for BRCA2 testing. BRCA-related cancers include these types:  Breast. This occurs in males  or females.  Ovarian.  Tubal. This may also be called fallopian tube cancer.  Cancer of the abdominal or pelvic lining (peritoneal cancer).  Prostate.  Pancreatic. Cervical, Uterine, and Ovarian Cancer Your health care provider may recommend that you be screened regularly for cancer of the pelvic organs. These include your ovaries, uterus, and vagina. This screening involves a pelvic exam, which includes checking for microscopic changes to the surface of your cervix (Pap test).  For women ages 21-65, health care providers may recommend a pelvic exam and a Pap test every three years. For women ages 92-65, they may recommend the Pap test and pelvic exam, combined with testing for human papilloma virus (HPV), every five years. Some types of HPV increase your risk of cervical cancer. Testing for HPV may also be done on women of any age who have unclear Pap test results.  Other health care providers may not recommend any screening for nonpregnant women who are considered low risk for pelvic cancer and have no symptoms. Ask your health care provider if a screening pelvic exam is right for you.  If you have had past treatment for cervical cancer or a condition that could lead to cancer, you need Pap tests and screening for cancer for at least 20 years after your treatment. If Pap tests have been discontinued for you, your risk factors (such as having a new sexual partner) need to be reassessed to determine if you should start having screenings again. Some women have medical problems that increase the chance of getting cervical cancer. In these cases, your health care provider may recommend that you have screening and Pap tests more often.  If you have a family history of uterine cancer or ovarian cancer, talk with your health care provider about genetic screening.  If you have vaginal bleeding after reaching menopause, tell your health care provider.  There are currently no reliable tests available  to screen for ovarian cancer. Lung Cancer Lung cancer screening is recommended for adults 70-92 years old who are at high risk for lung cancer because of a history of smoking. A yearly low-dose CT scan of the lungs is recommended if you:  Currently smoke.  Have a history of at least 30 pack-years of smoking and you currently smoke or have quit within the past 15 years. A pack-year is smoking an average of one pack of cigarettes per day for one year. Yearly screening should:  Continue until it has been 15 years since you quit.  Stop if you develop a health problem that would prevent you from having lung cancer treatment. Colorectal Cancer  This type of cancer can be detected and can often be prevented.  Routine colorectal cancer screening usually begins at age 63 and continues through age 61.  If  you have risk factors for colon cancer, your health care provider may recommend that you be screened at an earlier age.  If you have a family history of colorectal cancer, talk with your health care provider about genetic screening.  Your health care provider may also recommend using home test kits to check for hidden blood in your stool.  A small camera at the end of a tube can be used to examine your colon directly (sigmoidoscopy or colonoscopy). This is done to check for the earliest forms of colorectal cancer.  Direct examination of the colon should be repeated every 5-10 years until age 58. However, if early forms of precancerous polyps or small growths are found or if you have a family history or genetic risk for colorectal cancer, you may need to be screened more often. Skin Cancer  Check your skin from head to toe regularly.  Monitor any moles. Be sure to tell your health care provider:  About any new moles or changes in moles, especially if there is a change in a mole's shape or color.  If you have a mole that is larger than the size of a pencil eraser.  If any of your family  members has a history of skin cancer, especially at a [place] age, talk with your health care provider about genetic screening.  Always use sunscreen. Apply sunscreen liberally and repeatedly throughout the day.  Whenever you are outside, protect yourself by wearing long sleeves, pants, a wide-brimmed hat, and sunglasses. WHAT SHOULD I KNOW ABOUT OSTEOPOROSIS? Osteoporosis is a condition in which bone destruction happens more quickly than new bone creation. After menopause, you may be at an increased risk for osteoporosis. To help prevent osteoporosis or the bone fractures that can happen because of osteoporosis, the following is recommended:  If you are 65-42 years old, get at least 1,000 mg of calcium and at least 600 mg of vitamin D per day.  If you are older than age 62 but younger than age 10, get at least 1,200 mg of calcium and at least 600 mg of vitamin D per day.  If you are older than age 75, get at least 1,200 mg of calcium and at least 800 mg of vitamin D per day. Smoking and excessive alcohol intake increase the risk of osteoporosis. Eat foods that are rich in calcium and vitamin D, and do weight-bearing exercises several times each week as directed by your health care provider. WHAT SHOULD I KNOW ABOUT HOW MENOPAUSE AFFECTS [place]? Depression may occur at any age, but it is more common as you become older. Common symptoms of depression include:  Low or sad mood.  Changes in sleep patterns.  Changes in appetite or eating patterns.  Feeling an overall lack of motivation or enjoyment of activities that you previously enjoyed.  Frequent crying spells. Talk with your health care provider if you think that you are experiencing depression. WHAT SHOULD I KNOW ABOUT IMMUNIZATIONS? It is important that you get and maintain your immunizations. These include:  Tetanus, diphtheria, and pertussis (Tdap) booster vaccine.  Influenza every year before the flu season  begins.  Pneumonia vaccine.  Shingles vaccine. Your health care provider may also recommend other immunizations.   This information is not intended to replace advice given to you by your health care provider. Make sure you discuss any questions you have with your health care provider.   Document Released: 09/09/2005 Document Revised: 08/08/2014 Document Reviewed: 03/20/2014 Elsevier Interactive Patient Education  2016 [employer].

## 2015-07-09 NOTE — Progress Notes (Signed)
Sandra Craig November 08, 1940 [id number]    History:    Presents for breast and pelvic exam. Normal Pap and mammogram history. 1994 TVH with BSO for endometriosis. History of right hip and right shoulder fracture from traumatic fall. Reclast 2011, 2012, Prolia 2013, 14 and 15. Primary care manages hypertension/hypercholesterolemia/anxiety and depression -primary care physician having health problems in the process of being transferred to partner in the practice. Having numerous back/disc problems in therapy, requested refill of Vicodin, has used one prescription of 30 since July. Has follow-up scheduled. Squamous cell cancer history annual skin checks. Negative lung biopsy 12/2014 currently being treated for MAI/nontuberculin infection. 2013 negative polyp. Current on vaccinations.  Past medical history, past surgical history, family history and social history were all reviewed and documented in the EPIC chart. Retired from [employer].  ROS:  A ROS was performed and pertinent positives and negatives are included.  Exam:  Filed Vitals:   07/09/15 1408  BP: 118/80    General appearance:  Normal Thyroid:  Symmetrical, normal in size, without palpable masses or nodularity. Respiratory  Auscultation:  Clear without wheezing or rhonchi Cardiovascular  Auscultation:  Regular rate, without rubs, murmurs or gallops  Edema/varicosities:  Not grossly evident Abdominal  Soft,nontender, without masses, guarding or rebound.  Liver/spleen:  No organomegaly noted  Hernia:  None appreciated  Skin  Inspection:  Grossly normal   Breasts: Examined lying and sitting.     Right: Without masses, retractions, discharge or axillary adenopathy.     Left: Without masses, retractions, discharge or axillary adenopathy. Gentitourinary   Inguinal/mons:  Normal without inguinal adenopathy  External genitalia:  Normal  BUS/Urethra/Skene's glands:  Normal  Vagina:  Normal  Cervix: And uterus  absent  Adnexa/parametria:     Rt: Without masses or tenderness.   Lt: Without masses or tenderness.  Anus and perineum: Normal  Digital rectal exam: Normal sphincter tone without palpated masses or tenderness  Assessment/Plan:  74 y.o.  WWF G1 P0  for breast and pelvic exam.  TVH with BSO on no HRT Low back pain/disc problem in therapy IBS/hypertension/hypercholesterolemia/anxiety and depression - primary care manages labs and meds Osteoporosis completed 5 years of therapy History of squamous cell skin cancer-annual skin checks  Plan: Repeat DEXA will schedule. Reviewed UTI importance of home safety, fall prevention and weight bearing exercise. Vitamin D 2000 daily calcium rich diet encouraged. SBE's, continue annual screening mammogram, due this month will schedule. Continue annual skin checks.  Prescription of Vicodin every 8 hours when necessary #30 with no refills given, aware of addictive properties, to use sparingly and can cause constipation.    Huel Cote [facility], 5:09 PM 07/09/2015

## 2015-07-15 DIAGNOSIS — M25552 Pain in left hip: Secondary | ICD-10-CM | POA: Diagnosis not present

## 2015-07-15 DIAGNOSIS — M19012 Primary osteoarthritis, left shoulder: Secondary | ICD-10-CM | POA: Diagnosis not present

## 2015-07-17 DIAGNOSIS — Z79899 Other long term (current) drug therapy: Secondary | ICD-10-CM | POA: Diagnosis not present

## 2015-07-21 ENCOUNTER — Ambulatory Visit (INDEPENDENT_AMBULATORY_CARE_PROVIDER_SITE_OTHER): Payer: Medicare Other

## 2015-07-21 DIAGNOSIS — M25552 Pain in left hip: Secondary | ICD-10-CM | POA: Diagnosis not present

## 2015-07-21 DIAGNOSIS — M810 Age-related osteoporosis without current pathological fracture: Secondary | ICD-10-CM

## 2015-07-23 DIAGNOSIS — M25552 Pain in left hip: Secondary | ICD-10-CM | POA: Diagnosis not present

## 2015-07-23 DIAGNOSIS — M545 Low back pain: Secondary | ICD-10-CM | POA: Diagnosis not present

## 2015-07-29 ENCOUNTER — Other Ambulatory Visit: Payer: Self-pay | Admitting: Emergency Medicine

## 2015-07-29 ENCOUNTER — Encounter: Payer: Self-pay | Admitting: Women's Health

## 2015-08-14 ENCOUNTER — Telehealth: Payer: Self-pay | Admitting: Pulmonary Disease

## 2015-08-14 DIAGNOSIS — Z5181 Encounter for therapeutic drug level monitoring: Secondary | ICD-10-CM

## 2015-08-14 NOTE — Telephone Encounter (Signed)
Needs office visit, really not clear Please arrange CBC and lft and bmet  Not sure if this is related to her mac treatment. I'm glad she saw an eye doctor.

## 2015-08-14 NOTE — Telephone Encounter (Signed)
Spoke with pt, states she is having visual changes (eye dr said this was due to dry eyes and blepheritis), increased dizziness, stomach pain, headaches, and uncontrollable hand jerking X1 month.   Pt has received a couple of steroid injections in back and hips, and has started baclofen for muscle spasms since last ov-states she does not take baclofen often d/t "making pt feel funny" since her last ov with BQ in 05/2015, but states she had no med changes in the past 1-2 mos.  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  Pt has never had the s/s before.    Pt uses [facility] on [address].    BQ please advise on recs.  Thanks!

## 2015-08-14 NOTE — Telephone Encounter (Signed)
Pt returning call.Sandra Craig ° °

## 2015-08-14 NOTE — Telephone Encounter (Signed)
Spoke with pt, scheduled Monday morning at 9:00 with TP. Labs ordered-pt having these drawn before ov on Monday.  Nothing further needed at this time.

## 2015-08-14 NOTE — Telephone Encounter (Signed)
lmtcb X1 for pt  

## 2015-08-17 ENCOUNTER — Ambulatory Visit (INDEPENDENT_AMBULATORY_CARE_PROVIDER_SITE_OTHER): Payer: Medicare Other | Admitting: Adult Health

## 2015-08-17 ENCOUNTER — Encounter: Payer: Self-pay | Admitting: Adult Health

## 2015-08-17 ENCOUNTER — Other Ambulatory Visit (INDEPENDENT_AMBULATORY_CARE_PROVIDER_SITE_OTHER): Payer: Medicare Other

## 2015-08-17 VITALS — BP 108/78 | HR 74 | Temp 97.7°F | Ht 65.0 in | Wt 138.0 lb

## 2015-08-17 DIAGNOSIS — Z5181 Encounter for therapeutic drug level monitoring: Secondary | ICD-10-CM | POA: Diagnosis not present

## 2015-08-17 DIAGNOSIS — A310 Pulmonary mycobacterial infection: Secondary | ICD-10-CM

## 2015-08-17 DIAGNOSIS — R110 Nausea: Secondary | ICD-10-CM | POA: Insufficient documentation

## 2015-08-17 LAB — CBC WITH DIFFERENTIAL/PLATELET
Basophils Absolute: 0 10*3/uL (ref 0.0–0.1)
Basophils Relative: 0.6 % (ref 0.0–3.0)
Eosinophils Absolute: 0.1 10*3/uL (ref 0.0–0.7)
Eosinophils Relative: 2.4 % (ref 0.0–5.0)
HCT: 39 % (ref 36.0–46.0)
Hemoglobin: 12.8 g/dL (ref 12.0–15.0)
Lymphocytes Relative: 36.5 % (ref 12.0–46.0)
Lymphs Abs: 1.7 10*3/uL (ref 0.7–4.0)
MCHC: 32.9 g/dL (ref 30.0–36.0)
MCV: 90.5 fl (ref 78.0–100.0)
Monocytes Absolute: 0.4 10*3/uL (ref 0.1–1.0)
Monocytes Relative: 7.5 % (ref 3.0–12.0)
Neutro Abs: 2.5 10*3/uL (ref 1.4–7.7)
Neutrophils Relative %: 53 % (ref 43.0–77.0)
Platelets: 168 10*3/uL (ref 150.0–400.0)
RBC: 4.32 Mil/uL (ref 3.87–5.11)
RDW: 14.5 % (ref 11.5–15.5)
WBC: 4.7 10*3/uL (ref 4.0–10.5)

## 2015-08-17 LAB — BASIC METABOLIC PANEL
BUN: 26 mg/dL — ABNORMAL HIGH (ref 6–23)
CO2: 25 mEq/L (ref 19–32)
Calcium: 9.6 mg/dL (ref 8.4–10.5)
Chloride: 107 mEq/L (ref 96–112)
Creatinine, Ser: 1.42 mg/dL — ABNORMAL HIGH (ref 0.40–1.20)
GFR: 38.51 mL/min — ABNORMAL LOW (ref >60.00)
Glucose, Bld: 87 mg/dL (ref 70–99)
Potassium: 4.2 mEq/L (ref 3.5–5.1)
Sodium: 146 mEq/L — ABNORMAL HIGH (ref 135–145)

## 2015-08-17 LAB — HEPATIC FUNCTION PANEL
ALT: 17 U/L (ref 0–35)
AST: 23 U/L (ref 0–37)
Albumin: 3.8 g/dL (ref 3.5–5.2)
Alkaline Phosphatase: 59 U/L (ref 39–117)
Bilirubin, Direct: 0 mg/dL (ref 0.0–0.3)
Total Bilirubin: 0.3 mg/dL (ref 0.2–1.2)
Total Protein: 6.7 g/dL (ref 6.0–8.3)

## 2015-08-17 NOTE — Assessment & Plan Note (Signed)
For now continue on triple combo regimen , ? Drug intolerance vs isolated episode ? Viral infection  Has resolved for now . She advised to take rx w/ food . Try probiotic  Plan Change Biaxin and Ethambutol to dinner time with food May continue Rifampin At bedtime  .  Push fluids  Continue with eye doctor follow up  I will call with labs today .  Follow up Dr. Lake Bells in 2 weeks and As needed

## 2015-08-17 NOTE — Progress Notes (Signed)
I have seen and examined the patient with Rexene Edison and I agree with this plan of care.  Continue MAI treatment, adjust schedule.

## 2015-08-17 NOTE — Progress Notes (Signed)
Subjective:    Patient ID: Sandra Craig, female    DOB: [birth year], 75 y.o.   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  Complains that 3 days ago had dizziness, abdominal pain, sinus drainage and nausea.  She also had some involuntary jerking of hands . This lasted about 24 hr and resolved.  She is feeling back to baseline with normal appetite .  Denies any chest tightness/congestion, sinus pressure, wheezing, fever or vomiting.  She has had some dry eyes and visual changes for last few months. She is seeing a eye doctor with dx of blepharitis. She is being referred to specialist.  She denies vision loss, speech changes or arm weakness. No syncope or palpitations. No severe headaches.  She is taking triple abx regimen on empty stomach at bedtime  Has been on therapy for last 4 months, with no known issues.  Labs done this am , pending.  No flare of cough or congestion . No fever.   Review of Systems Constitutional:   No  weight loss, night sweats,  Fevers, chills, fatigue, or  lassitude.  HEENT:   No headaches,  Difficulty swallowing,  Tooth/dental problems, or  Sore throat,                No sneezing, itching, ear ache, nasal congestion, post nasal drip,   CV:  No chest pain,  Orthopnea, PND, swelling in lower extremities, anasarca, dizziness, palpitations, syncope.   GI  No heartburn, indigestion, abdominal pain, nausea, vomiting, diarrhea, change in bowel habits, loss of appetite, bloody stools.   Resp:   No excess mucus, no productive cough,  No non-productive cough,  No coughing up of blood.  No change in color of mucus.  No wheezing.  No chest wall deformity  Skin: no rash or lesions.  GU: no dysuria, change in color of urine, no urgency or frequency.  No flank pain, no hematuria   MS:  No joint pain or swelling.  No decreased  range of motion.  No back pain.  Psych:  No change in mood or affect. No depression or anxiety.  No memory loss.         Objective:   Physical Exam Filed Vitals:   08/17/15 0906  BP: 108/78  Pulse: 74  Temp: 97.7 F (36.5 C)  TempSrc: Oral  Height: '5\' 5"'$  (1.651 m)  Weight: 138 lb (62.596 kg)  SpO2: 95%   GEN: A/Ox3; pleasant , NAD, well nourished   HEENT:  [place]/AT,  EACs-clear, TMs-wnl, NOSE-clear, THROAT-clear, no lesions, no postnasal drip or exudate noted.   NECK:  Supple w/ fair ROM; no JVD; normal carotid impulses w/o bruits; no thyromegaly or nodules palpated; no lymphadenopathy.  RESP  Clear  P & A; w/o, wheezes/ rales/ or rhonchi.no accessory muscle use, no dullness to percussion  CARD:  RRR, no m/r/g  , no peripheral edema, pulses intact, no cyanosis or clubbing.  GI:   Soft & nt; nml bowel sounds; no organomegaly or masses detected. No guarding or rebound   Musco: Warm bil, no deformities or joint swelling noted.   Neuro: alert, no focal deficits noted.  CN 2-12 intact, nml gait, no abn movements or tremor noted.  nml grips. MAEX x 4 with equal strength.   Skin: Warm, no lesions  or rashes         Assessment & Plan:

## 2015-08-17 NOTE — Patient Instructions (Addendum)
Change Biaxin and Ethambutol to dinner time with food May continue Rifampin At bedtime  .  Push fluids  Continue with eye doctor follow up  I will call with labs today .  Follow up Dr. Lake Bells in 2 weeks and As needed   Please contact office for sooner follow up if symptoms do not improve or worsen or seek emergency care

## 2015-08-17 NOTE — Assessment & Plan Note (Signed)
Nausea -resolved ? Drug intolerance  Labs pending  Try abx with food  Plan  Change Biaxin and Ethambutol to dinner time with food May continue Rifampin At bedtime  .  Push fluids  Continue with eye doctor follow up  I will call with labs today .  Follow up Dr. Lake Bells in 2 weeks and As needed

## 2015-08-18 ENCOUNTER — Telehealth: Payer: Self-pay | Admitting: Pulmonary Disease

## 2015-08-18 NOTE — Telephone Encounter (Signed)
Spoke with pt, states she is returning a call regarding her labwork.  I see no documentation where this has been interpreted by BQ yet.   BQ please advise on pt's labwork .  Thanks!

## 2015-08-18 NOTE — Telephone Encounter (Signed)
lmtcb x1 for pt. I do not see where we attempted to call her.

## 2015-08-19 ENCOUNTER — Ambulatory Visit (INDEPENDENT_AMBULATORY_CARE_PROVIDER_SITE_OTHER): Payer: Medicare Other | Admitting: Internal Medicine

## 2015-08-19 ENCOUNTER — Encounter: Payer: Self-pay | Admitting: Internal Medicine

## 2015-08-19 VITALS — BP 110/70 | HR 70 | Temp 97.9°F | Wt 138.5 lb

## 2015-08-19 DIAGNOSIS — M545 Low back pain, unspecified: Secondary | ICD-10-CM

## 2015-08-19 DIAGNOSIS — G4700 Insomnia, unspecified: Secondary | ICD-10-CM | POA: Diagnosis not present

## 2015-08-19 DIAGNOSIS — Z79899 Other long term (current) drug therapy: Secondary | ICD-10-CM

## 2015-08-19 MED ORDER — GABAPENTIN 300 MG PO CAPS: 300.0000 mg | ORAL_CAPSULE | Freq: Two times a day (BID) | ORAL | Status: DC

## 2015-08-19 MED ORDER — HYDROCODONE-ACETAMINOPHEN 5-325 MG PO TABS
ORAL_TABLET | ORAL | Status: DC
Start: 2015-08-19 — End: 2015-10-15

## 2015-08-19 MED ORDER — LOSARTAN POTASSIUM 50 MG PO TABS: 50.0000 mg | ORAL_TABLET | Freq: Every day | ORAL | Status: DC

## 2015-08-19 MED ORDER — ROSUVASTATIN CALCIUM 20 MG PO TABS: 20.0000 mg | ORAL_TABLET | Freq: Every day | ORAL | Status: DC

## 2015-08-19 MED ORDER — ZOLPIDEM TARTRATE 10 MG PO TABS: 10.0000 mg | ORAL_TABLET | Freq: Every evening | ORAL | Status: DC | PRN

## 2015-08-19 MED ORDER — SERTRALINE HCL 100 MG PO TABS: 150.0000 mg | ORAL_TABLET | Freq: Every day | ORAL | Status: DC

## 2015-08-19 NOTE — Telephone Encounter (Signed)
Spoke with pt, aware of results/recs.  Nothing further needed.  

## 2015-08-19 NOTE — Progress Notes (Signed)
Pre visit review using our clinic review tool, if applicable. No additional management support is needed unless otherwise documented below in the visit note. 

## 2015-08-19 NOTE — Telephone Encounter (Signed)
Her kidney function is not normal, but it has been this way for a while as I see it from the notes and it is not any worse.  Otherwise the labs were unremarkable.

## 2015-08-19 NOTE — Progress Notes (Signed)
Subjective:    Patient ID: Sandra Craig, female    DOB: 1941-02-13, 75 y.o.   MRN: [id number]  HPI  75 year old white female with history of chronic low back pain, polycystic kidney disease and chronic osteoarthritis of left shoulder for routine follow up.  Patient reports she was seen by Dr. Pearletha Alfred. She has had minimal improvement in low back pain despite epidural steroid injections. She does not want to consider low back surgery. Patient has also tried multiple sessions of physical therapy with minimal improvement. Low back pain is constant. It is usually left-sided but she has intermittent right-sided symptoms as well.  Chronic insomnia-she reports poor sleep quality which she attributes to pain issues.  Interval medical history - followed by pulmonologist for MAC.  She is on long-term chewable antibiotic therapy. She is having difficult time tolerating antibiotics.   Review of Systems Negative for weakness.  Occasionally feels dizzy and unsteady.  Medications reviewed.    Past Medical History  Diagnosis Date  . IBS (irritable bowel syndrome)   . Hypertension   . Hyperlipidemia   . IC (interstitial cystitis)   . Endometriosis   . Polycystic kidney disease   . Osteopenia   . Cystitis   . Fibromyalgia   . Palpitations   . Chronic insomnia   . Pulmonary nodule 12/08    5 mm Anterior RUL  . Hiatal hernia   . Diverticulosis of colon (without mention of hemorrhage)   . Internal hemorrhoid   . Family history of malignant neoplasm of gastrointestinal tract   . Gastritis   . Small bowel obstruction ([place])   . Osteonecrosis ([place])   . History of gallstones   . Diverticulitis of intestine without perforation or abscess without bleeding     Patient did have abscess but noperforation  . 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 Osteoporosis     Social History   Social History  . Marital Status: Widowed    Spouse Name: N/A  . Number of Children:  N/A  . Years of Education: N/A   Occupational History  . Retired     [employer]   Social History Main Topics  . Smoking status: Former Smoker -- 0.75 packs/day for 8 years    Types: Cigarettes    Quit date: 08/01/1976  . Smokeless tobacco: Never Used     Comment: Quit in 1978  . Alcohol Use: No  . Drug Use: No  . Sexual Activity: No   Other Topics Concern  . Not on file   Social History Narrative    Past Surgical History  Procedure Laterality Date  . Cholecystectomy    . Pelvic laparoscopy    . Abdominal hysterectomy  1994    TAH,BSO FOR ENDOMETRIOSIS  . Oophorectomy  1994    TAH,BSO  . Total hip arthroplasty  FALL OF 2008    rt. partial hip replacement  . Abdominal surgery  2011    small intestine blockage  . S/p right shoulder rotater cuff  200216/2011    Tear/adhesive capsulitis  . Sbo lap  11    Adhesions and small internal hernia  . Joint replacement  2008  . Gastroplasty  2011    small bowel resection -open  . Total shoulder arthroplasty  06/05/2012    Procedure: TOTAL SHOULDER ARTHROPLASTY;  Surgeon: Johnny Bridge, MD;  Location: [facility];  Service: Orthopedics;  Laterality: Right;  RIGHT SHOULDER TOTAL ARTHROPLASTY, HEMIARTHROPLASTY, SHOULDER, FOR ARTHRITIS  .  Shoulder hemi-arthroplasty  06/05/2012    Procedure: SHOULDER HEMI-ARTHROPLASTY;  Surgeon: Johnny Bridge, MD;  Location: [facility];  Service: Orthopedics;  Laterality: Right;  FOR ARTHRITIS  . Video bronchoscopy Bilateral 01/28/2015    Procedure: VIDEO BRONCHOSCOPY WITH FLUORO;  Surgeon: Collene Gobble, MD;  Location: [facility];  Service: Cardiopulmonary;  Laterality: Bilateral;    Family History  Problem Relation Age of Onset  . Heart disease Mother     MI at age 55  . Lymphoma Maternal Grandmother   . Colon cancer Paternal Grandmother   . COPD Father   . Emphysema Mother   . Thyroid disease Mother     Thyroidectomy/Benign  . Cancer Brother     adenocarcinoma right lung    Allergies    Allergen Reactions  . Azithromycin Rash    Pruritic rash diffuse   . Celecoxib   . Prednisone Other (See Comments)    Made pt feel "crazy"  . Sulfonamide Derivatives     Current Outpatient Prescriptions on File Prior to Visit  Medication Sig Dispense Refill  . Calcium Carbonate-Vit D-Min (CALCIUM 1200 PO) Take 2 capsules by mouth daily.     . clarithromycin (BIAXIN) 500 MG tablet One three times weekly 40 tablet 4  . ethambutol (MYAMBUTOL) 400 MG tablet Take 3 tablets (1,200 mg total) by mouth 3 (three) times a week. 90 tablet 5  . fluticasone (FLONASE) 50 MCG/ACT nasal spray SHAKE WELL AND USE 2 SPRAYS IN EACH NOSTRIL DAILY 48 g 0  . gabapentin (NEURONTIN) 300 MG capsule Take 1 capsule (300 mg total) by mouth 2 (two) times daily. 180 capsule 1  . HYDROcodone-acetaminophen (NORCO/VICODIN) 5-325 MG tablet TK 1 T PO EVERY 8 HOURS AS NEEDED FOR PAIN 30 tablet 0  . losartan (COZAAR) 50 MG tablet Take 1 tablet (50 mg total) by mouth daily. 90 tablet 1  . ranitidine (ZANTAC) 150 MG capsule Take 150 mg by mouth daily.    . rifampin (RIFADIN) 300 MG capsule Take 2 capsules (600 mg total) by mouth 3 (three) times a week. 60 capsule 6  . rosuvastatin (CRESTOR) 20 MG tablet Take 1 tablet (20 mg total) by mouth daily. 90 tablet 1  . sertraline (ZOLOFT) 100 MG tablet Take 1.5 tablets (150 mg total) by mouth daily. 135 tablet 1  . zolpidem (AMBIEN) 10 MG tablet Take 1 tablet (10 mg total) by mouth at bedtime as needed. For sleep. 90 tablet 0   No current facility-administered medications on file prior to visit.    BP 110/70 mmHg  Pulse 70  Temp(Src) 97.9 F (36.6 C) (Oral)  Wt 138 lb 8 oz (62.823 kg)  LMP 08/25/1992    Objective:   Physical Exam  Constitutional: She is oriented to person, place, and time. She appears well-developed and well-nourished.  Cardiovascular: Normal rate, regular rhythm and normal heart sounds.   No murmur heard. Pulmonary/Chest: Effort normal and breath sounds  normal. She has no wheezes.  Musculoskeletal: She exhibits no edema.  Neurological: She is alert and oriented to person, place, and time. No cranial nerve deficit.  Muscle strength 5 out of 5 lower ext bilaterally.  Able to toe and heal walk  Skin: Skin is warm and dry.  Psychiatric: She has a normal mood and affect. Her behavior is normal.        Assessment & Plan:    1.  Chronic low back pain 2.  Chronic insomnia 3.  Polypharmacy  Patient has had minimal  improvement with epidural steroid injections of lumbar spine, and physical therapy. I would like to avoid long-term necrotic use. Trial of massage therapy, aqua therapy and acupuncture.  Use vicodin for severe pain only.  Continue same dose of ambien.  She has used medication long term and has developed tolerance  We discussed staggering dose of medication to avoid side effects.

## 2015-08-20 ENCOUNTER — Encounter: Payer: Self-pay | Admitting: Adult Health

## 2015-08-31 ENCOUNTER — Encounter: Payer: Self-pay | Admitting: Pulmonary Disease

## 2015-08-31 ENCOUNTER — Ambulatory Visit (INDEPENDENT_AMBULATORY_CARE_PROVIDER_SITE_OTHER): Payer: Medicare Other | Admitting: Pulmonary Disease

## 2015-08-31 VITALS — BP 110/58 | HR 75 | Ht 66.0 in | Wt 138.2 lb

## 2015-08-31 DIAGNOSIS — A310 Pulmonary mycobacterial infection: Secondary | ICD-10-CM

## 2015-08-31 NOTE — Assessment & Plan Note (Signed)
Sandra Craig has been doing well from a respiratory standpoint. Her dizziness has resolved fortunately. However, I am concerned about the visual acuity changes in the right eye. Because of this, I'm going to have her stop the ethambutol and continue rifampin and clarithromycin through August 2017.  Plan: As above Repeat liver function tests in 3 months when she follows up

## 2015-08-31 NOTE — Patient Instructions (Signed)
Stop taking ethambutol Keep taking rifampin and Biaxin as you're doing We will see you back in 3 months and get liver function tests at that point

## 2015-08-31 NOTE — Progress Notes (Signed)
Subjective:    Patient ID: Sandra Craig, female    DOB: 31-Jul-1941, 75 y.o.   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  She was initially followed for a pulmonary nodule in 2008. She used to smoke and quit in 1977 after ten years.  < 1 ppd.  She was diagnosed with a bronchoscopy in June 2016. A CT scan in 2016 showed characteristic tree in bud abnormalities which were worrisome for Mycobacterium avium intercellular. She started 3 drug therapy in August 2016, but had to stop azithromycin after 2 weeks because of a rash. Started clarithomycin.   HPI Chief Complaint  Patient presents with  . Follow-up    Pt c/o increased prod cough with clear mucus.  States her mucus production has increased since starting 3 abx therapy.    Sandra Craig had some dizziness recently and came in to our office for further evaluation.  She change the timing of her ethambutol and biaxin at night. She is now feeling better.  She has no dizziness.   She has some dry eyes, and mild visual changes which persist.  She is following with Dr. Einar Gip with opthalmology.  Past Medical History  Diagnosis Date  . IBS (irritable bowel syndrome)   . Hypertension   . Hyperlipidemia   . IC (interstitial cystitis)   . Endometriosis   . Polycystic kidney disease   . Osteopenia   . Cystitis   . Fibromyalgia   . Palpitations   . Chronic insomnia   . Pulmonary nodule 12/08    5 mm Anterior RUL  . Hiatal hernia   . Diverticulosis of colon (without mention of hemorrhage)   . Internal hemorrhoid   . Family history of malignant neoplasm of gastrointestinal tract   . Gastritis   . Small bowel obstruction ([place])   . Osteonecrosis ([place])   . History of gallstones   . Diverticulitis of intestine without perforation or abscess without bleeding     Patient did have abscess but noperforation  . 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 Osteoporosis        Review of Systems     Objective:   Physical Exam Filed Vitals:   08/31/15 1446  BP: 110/58  Pulse: 75  Height: '5\' 6"'$  (1.676 m)  Weight: 138 lb 3.2 oz (62.687 kg)  SpO2: 96%    RA  Gen: well appearing HENT: OP clear, TM's clear, neck supple PULM: CTA B, normal percussion CV: RRR, no mgr, trace edema GI: BS+, soft, nontender Derm: no cyanosis or rash Psyche: normal mood and affect       Assessment & Plan:  MAI (mycobacterium avium-intracellulare) infection ([facility]) Sandra Craig has been doing well from a respiratory standpoint. Her dizziness has resolved fortunately. However, I am concerned about the visual acuity changes in the right eye. Because of this, I'm going to have her stop the ethambutol and continue rifampin and clarithromycin through August 2017.  Plan: As above Repeat liver function tests in 3 months when she follows up     Current outpatient prescriptions:  .  Calcium Carbonate-Vit D-Min (CALCIUM 1200 PO), Take 2 capsules by mouth daily. , Disp: , Rfl:  .  clarithromycin (BIAXIN) 500 MG tablet, One three times weekly, Disp: 40 tablet, Rfl: 4 .  fluticasone (FLONASE) 50 MCG/ACT nasal spray, SHAKE WELL AND USE 2 SPRAYS IN EACH NOSTRIL DAILY, Disp: 48 g, Rfl: 0 .  gabapentin (NEURONTIN) 300 MG capsule,  Take 1 capsule (300 mg total) by mouth 2 (two) times daily., Disp: 180 capsule, Rfl: 1 .  HYDROcodone-acetaminophen (NORCO/VICODIN) 5-325 MG tablet, Take 1 Tab PO EVERY 12 HOURS AS NEEDED FOR PAIN, Disp: 60 tablet, Rfl: 0 .  losartan (COZAAR) 50 MG tablet, Take 1 tablet (50 mg total) by mouth daily., Disp: 90 tablet, Rfl: 1 .  ranitidine (ZANTAC) 150 MG capsule, Take 150 mg by mouth daily., Disp: , Rfl:  .  rifampin (RIFADIN) 300 MG capsule, Take 2 capsules (600 mg total) by mouth 3 (three) times a week., Disp: 60 capsule, Rfl: 6 .  rosuvastatin (CRESTOR) 20 MG tablet, Take 1 tablet (20 mg total) by mouth daily., Disp: 90 tablet, Rfl: 1 .  sertraline (ZOLOFT)  100 MG tablet, Take 1.5 tablets (150 mg total) by mouth daily., Disp: 135 tablet, Rfl: 1 .  zolpidem (AMBIEN) 10 MG tablet, Take 1 tablet (10 mg total) by mouth at bedtime as needed. For sleep., Disp: 90 tablet, Rfl: 1

## 2015-09-16 DIAGNOSIS — D2272 Melanocytic nevi of left lower limb, including hip: Secondary | ICD-10-CM | POA: Diagnosis not present

## 2015-09-16 DIAGNOSIS — L820 Inflamed seborrheic keratosis: Secondary | ICD-10-CM | POA: Diagnosis not present

## 2015-09-16 DIAGNOSIS — D1801 Hemangioma of skin and subcutaneous tissue: Secondary | ICD-10-CM | POA: Diagnosis not present

## 2015-09-16 DIAGNOSIS — D225 Melanocytic nevi of trunk: Secondary | ICD-10-CM | POA: Diagnosis not present

## 2015-09-16 DIAGNOSIS — Z85828 Personal history of other malignant neoplasm of skin: Secondary | ICD-10-CM | POA: Diagnosis not present

## 2015-09-16 DIAGNOSIS — L821 Other seborrheic keratosis: Secondary | ICD-10-CM | POA: Diagnosis not present

## 2015-09-16 DIAGNOSIS — L814 Other melanin hyperpigmentation: Secondary | ICD-10-CM | POA: Diagnosis not present

## 2015-09-16 DIAGNOSIS — D2262 Melanocytic nevi of left upper limb, including shoulder: Secondary | ICD-10-CM | POA: Diagnosis not present

## 2015-09-17 DIAGNOSIS — D631 Anemia in chronic kidney disease: Secondary | ICD-10-CM | POA: Diagnosis not present

## 2015-09-17 DIAGNOSIS — N2581 Secondary hyperparathyroidism of renal origin: Secondary | ICD-10-CM | POA: Diagnosis not present

## 2015-09-17 DIAGNOSIS — N183 Chronic kidney disease, stage 3 (moderate): Secondary | ICD-10-CM | POA: Diagnosis not present

## 2015-09-17 DIAGNOSIS — Q613 Polycystic kidney, unspecified: Secondary | ICD-10-CM | POA: Diagnosis not present

## 2015-09-17 DIAGNOSIS — E785 Hyperlipidemia, unspecified: Secondary | ICD-10-CM | POA: Diagnosis not present

## 2015-10-08 DIAGNOSIS — H524 Presbyopia: Secondary | ICD-10-CM | POA: Diagnosis not present

## 2015-10-08 DIAGNOSIS — H04123 Dry eye syndrome of bilateral lacrimal glands: Secondary | ICD-10-CM | POA: Diagnosis not present

## 2015-10-08 DIAGNOSIS — H52221 Regular astigmatism, right eye: Secondary | ICD-10-CM | POA: Diagnosis not present

## 2015-10-08 DIAGNOSIS — H5202 Hypermetropia, left eye: Secondary | ICD-10-CM | POA: Diagnosis not present

## 2015-10-14 ENCOUNTER — Ambulatory Visit: Payer: Self-pay | Admitting: Internal Medicine

## 2015-10-15 ENCOUNTER — Encounter: Payer: Self-pay | Admitting: Family Medicine

## 2015-10-15 ENCOUNTER — Ambulatory Visit (INDEPENDENT_AMBULATORY_CARE_PROVIDER_SITE_OTHER): Payer: Medicare Other | Admitting: Family Medicine

## 2015-10-15 VITALS — BP 138/80 | HR 66 | Temp 97.9°F | Wt 138.0 lb

## 2015-10-15 DIAGNOSIS — E785 Hyperlipidemia, unspecified: Secondary | ICD-10-CM | POA: Diagnosis not present

## 2015-10-15 DIAGNOSIS — M545 Low back pain, unspecified: Secondary | ICD-10-CM

## 2015-10-15 DIAGNOSIS — I10 Essential (primary) hypertension: Secondary | ICD-10-CM

## 2015-10-15 DIAGNOSIS — G8929 Other chronic pain: Secondary | ICD-10-CM

## 2015-10-15 MED ORDER — HYDROCODONE-ACETAMINOPHEN 5-325 MG PO TABS: ORAL_TABLET | ORAL | Status: DC

## 2015-10-15 NOTE — Progress Notes (Signed)
Garret Reddish, MD  Subjective:  Sandra Craig is a 75 y.o. year old very pleasant female patient who presents for/with See problem oriented charting ROS- No chest pain or shortness of breath. No headache or blurry vision. Chronic low back pain with radiculopathy into left  Past Medical History-  Patient Active Problem List   Diagnosis Date Noted  . MAI (mycobacterium avium-intracellulare) infection ([place]) 10/10/2014    Priority: High  . Chronic low back pain 12/20/2013    Priority: High  . Hyperglycemia 06/27/2012    Priority: Medium  . History of small bowel obstruction 06/28/2011    Priority: Medium  . GERD with stricture 06/28/2011    Priority: Medium  . Polycystic kidney disease     Priority: Medium  . Hypertension     Priority: Medium  . Hyperlipidemia     Priority: Medium  . Osteoporosis 12/21/2009    Priority: Medium  . INSOMNIA, CHRONIC 10/08/2008    Priority: Medium  . Nausea 08/17/2015    Priority: Low  . Right lower quadrant pain 11/06/2012    Priority: Low  . Irritable bowel syndrome (IBS) 06/28/2011    Priority: Low  . IC (interstitial cystitis)     Priority: Low  . Hemorrhoids 10/08/2008    Priority: Low  . Lung nodules 08/09/2007    Priority: Low  . Chronic right shoulder pain 03/07/2014  . Benign essential tremor 10/09/2013  . 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 Diverticulitis of colon (without mention of hemorrhage) 04/30/2012  . Benign neoplasm of colon 04/30/2012  . Diverticulitis large intestine 02/11/2012  . Dysphagia 06/20/2011  . Endometriosis   . ANEMIA 10/08/2008  . DIVERTICULOSIS, COLON 10/08/2008  . BENIGN NEOPLASM OF LIVER AND BILIARY PASSAGES 07/09/2008  . HEMATURIA UNSPECIFIED 07/04/2008  . CHRONIC RHINITIS 03/27/2008  . Depression 02/08/2008  . FIBROMYALGIA 12/05/2007  . FATIGUE 11/29/2007  . Essential hypertension 05/28/2007    Medications- reviewed and updated Current Outpatient Prescriptions   Medication Sig Dispense Refill  . Calcium Carbonate-Vit D-Min (CALCIUM 1200 PO) Take 2 capsules by mouth daily.     . clarithromycin (BIAXIN) 500 MG tablet One three times weekly 40 tablet 4  . fluticasone (FLONASE) 50 MCG/ACT nasal spray SHAKE WELL AND USE 2 SPRAYS IN EACH NOSTRIL DAILY 48 g 0  . gabapentin (NEURONTIN) 300 MG capsule Take 1 capsule (300 mg total) by mouth 2 (two) times daily. 180 capsule 1  . losartan (COZAAR) 50 MG tablet Take 1 tablet (50 mg total) by mouth daily. 90 tablet 1  . ranitidine (ZANTAC) 150 MG capsule Take 150 mg by mouth daily.    . rifampin (RIFADIN) 300 MG capsule Take 2 capsules (600 mg total) by mouth 3 (three) times a week. 60 capsule 6  . rosuvastatin (CRESTOR) 20 MG tablet Take 1 tablet (20 mg total) by mouth daily. 90 tablet 1  . sertraline (ZOLOFT) 100 MG tablet Take 1.5 tablets (150 mg total) by mouth daily. 135 tablet 1  . HYDROcodone-acetaminophen (NORCO/VICODIN) 5-325 MG tablet Take 1 Tab PO EVERY 12 HOURS AS NEEDED FOR PAIN 60 tablet 0  . zolpidem (AMBIEN) 10 MG tablet Take 1 tablet (10 mg total) by mouth at bedtime as needed. For sleep. (Patient not taking: Reported on 10/15/2015) 90 tablet 1   No current facility-administered medications for this visit.    Objective: BP 138/80 mmHg  Pulse 66  Temp(Src) 97.9 F (36.6 C)  Wt 138 lb (62.596 kg)  LMP 08/25/1992 Gen:  NAD, resting comfortably  Assessment/Plan:  Hypertension S: controlled on losartan '100mg'$ - upper end of normal BP Readings from Last 3 Encounters:  10/15/15 138/80  08/31/15 110/58  08/19/15 110/70  A/P:Continue current medications   Hyperlipidemia S: well controlled on crestor '20mg'$ . No myalgias.  Lab Results  Component Value Date   CHOL 154 03/04/2015   HDL 48.40 03/04/2015   LDLCALC 73 03/04/2015   LDLDIRECT 171.2 09/26/2006   TRIG 163.0* 03/04/2015   CHOLHDL 3 03/04/2015   A/P: reasonable control with last LDL 73- continue current medication    Chronic low  back pain S: Follows with Dr. Trenton Gammon- they have discussed surgical intervention she has refused. PT, epidural injections have not helped. Massage minimal relief. Has not done acupuncture or aquatherapy previously mentioned. She is not sure gabapentin is helping. Uses about 1 hydrocodone a day. She has such severe pain at night wakes every 2 hours despite ambien '10mg'$ . Bilateral low back pain but worst on left and has left sided radiculopathy A/P: will trial just once a night gabapentin (may increase to BID if pain worsens). Refilled #60 hydrocodone and discussed no increase and in fact less frequent use may help with its long term benefit. Encouraged aqua therapy, acupuncture, consider hypnosis for pain management as well in future. Follow up in 2 months.     Return in about 2 months (around 12/15/2015). Return precautions advised.   Meds ordered this encounter  Medications  . HYDROcodone-acetaminophen (NORCO/VICODIN) 5-325 MG tablet    Sig: Take 1 Tab PO EVERY 12 HOURS AS NEEDED FOR PAIN    Dispense:  60 tablet    Refill:  0   The duration of face-to-face time during this visit was 30 minutes. Greater than 50% of this time was spent in counseling, explanation of diagnosis, planning of further management, and/or coordination of care.

## 2015-10-15 NOTE — Patient Instructions (Signed)
Refilled Hydrocodone #60. Max 1 per day. Taking a day off may make medicine work better in long run.   Gabapentin- take 1 pill in the evening. If your pain worsens on this- may resume morning dose.   Consider water therapy- walking in the water or aerobics  Consider acupuncture. Hypnotherapy for pain management (no insurance coverage unfortunately)

## 2015-10-15 NOTE — Assessment & Plan Note (Signed)
S: Follows with Dr. Trenton Gammon- they have discussed surgical intervention she has refused. PT, epidural injections have not helped. Massage minimal relief. Has not done acupuncture or aquatherapy previously mentioned. She is not sure gabapentin is helping. Uses about 1 hydrocodone a day. She has such severe pain at night wakes every 2 hours despite ambien '10mg'$ . Bilateral low back pain but worst on left and has left sided radiculopathy A/P: will trial just once a night gabapentin (may increase to BID if pain worsens). Refilled #60 hydrocodone and discussed no increase and in fact less frequent use may help with its long term benefit. Encouraged aqua therapy, acupuncture, consider hypnosis for pain management as well in future. Follow up in 2 months.

## 2015-10-15 NOTE — Assessment & Plan Note (Signed)
S: controlled on losartan '100mg'$ - upper end of normal BP Readings from Last 3 Encounters:  10/15/15 138/80  08/31/15 110/58  08/19/15 110/70  A/P:Continue current medications

## 2015-10-15 NOTE — Assessment & Plan Note (Signed)
S: well controlled on crestor '20mg'$ . No myalgias.  Lab Results  Component Value Date   CHOL 154 03/04/2015   HDL 48.40 03/04/2015   LDLCALC 73 03/04/2015   LDLDIRECT 171.2 09/26/2006   TRIG 163.0* 03/04/2015   CHOLHDL 3 03/04/2015   A/P: reasonable control with last LDL 73- continue current medication

## 2015-11-06 ENCOUNTER — Ambulatory Visit: Payer: Medicare Other | Admitting: Family Medicine

## 2015-11-06 ENCOUNTER — Emergency Department (HOSPITAL_COMMUNITY)
Admission: EM | Admit: 2015-11-06 | Discharge: 2015-11-06 | Disposition: A | Discharge status: Home / Self Care | Payer: Medicare Other | Attending: Emergency Medicine | Admitting: Emergency Medicine

## 2015-11-06 ENCOUNTER — Telehealth: Payer: Self-pay | Admitting: Family Medicine

## 2015-11-06 ENCOUNTER — Encounter (HOSPITAL_COMMUNITY): Payer: Self-pay | Admitting: Emergency Medicine

## 2015-11-06 ENCOUNTER — Emergency Department (HOSPITAL_COMMUNITY): Payer: Medicare Other

## 2015-11-06 DIAGNOSIS — Z87448 Personal history of other diseases of urinary system: Secondary | ICD-10-CM | POA: Diagnosis not present

## 2015-11-06 DIAGNOSIS — G4700 Insomnia, unspecified: Secondary | ICD-10-CM | POA: Diagnosis not present

## 2015-11-06 DIAGNOSIS — M797 Fibromyalgia: Secondary | ICD-10-CM | POA: Diagnosis not present

## 2015-11-06 DIAGNOSIS — I10 Essential (primary) hypertension: Secondary | ICD-10-CM | POA: Insufficient documentation

## 2015-11-06 DIAGNOSIS — E785 Hyperlipidemia, unspecified: Secondary | ICD-10-CM | POA: Diagnosis not present

## 2015-11-06 DIAGNOSIS — Q613 Polycystic kidney, unspecified: Secondary | ICD-10-CM | POA: Insufficient documentation

## 2015-11-06 DIAGNOSIS — R0789 Other chest pain: Secondary | ICD-10-CM | POA: Insufficient documentation

## 2015-11-06 DIAGNOSIS — Z87891 Personal history of nicotine dependence: Secondary | ICD-10-CM | POA: Insufficient documentation

## 2015-11-06 DIAGNOSIS — R079 Chest pain, unspecified: Secondary | ICD-10-CM | POA: Diagnosis present

## 2015-11-06 DIAGNOSIS — Z8742 Personal history of other diseases of the female genital tract: Secondary | ICD-10-CM | POA: Insufficient documentation

## 2015-11-06 DIAGNOSIS — M810 Age-related osteoporosis without current pathological fracture: Secondary | ICD-10-CM | POA: Diagnosis not present

## 2015-11-06 DIAGNOSIS — Z79899 Other long term (current) drug therapy: Secondary | ICD-10-CM | POA: Insufficient documentation

## 2015-11-06 DIAGNOSIS — M8580 Other specified disorders of bone density and structure, unspecified site: Secondary | ICD-10-CM | POA: Insufficient documentation

## 2015-11-06 DIAGNOSIS — Z7951 Long term (current) use of inhaled steroids: Secondary | ICD-10-CM | POA: Diagnosis not present

## 2015-11-06 DIAGNOSIS — Z8719 Personal history of other diseases of the digestive system: Secondary | ICD-10-CM | POA: Insufficient documentation

## 2015-11-06 DIAGNOSIS — R0602 Shortness of breath: Secondary | ICD-10-CM | POA: Diagnosis not present

## 2015-11-06 LAB — BASIC METABOLIC PANEL
Anion gap: 13 (ref 5–15)
BUN: 22 mg/dL — ABNORMAL HIGH (ref 6–20)
CO2: 25 mmol/L (ref 22–32)
Calcium: 9.8 mg/dL (ref 8.9–10.3)
Chloride: 105 mmol/L (ref 101–111)
Creatinine, Ser: 1.48 mg/dL — ABNORMAL HIGH (ref 0.44–1.00)
GFR calc Af Amer: 39 mL/min — ABNORMAL LOW (ref >60)
GFR calc non Af Amer: 34 mL/min — ABNORMAL LOW (ref >60)
Glucose, Bld: 91 mg/dL (ref 65–99)
Potassium: 4.1 mmol/L (ref 3.5–5.1)
Sodium: 143 mmol/L (ref 135–145)

## 2015-11-06 LAB — CBC
HCT: 40.2 % (ref 36.0–46.0)
Hemoglobin: 12.9 g/dL (ref 12.0–15.0)
MCH: 29.1 pg (ref 26.0–34.0)
MCHC: 32.1 g/dL (ref 30.0–36.0)
MCV: 90.5 fL (ref 78.0–100.0)
Platelets: 165 10*3/uL (ref 150–400)
RBC: 4.44 MIL/uL (ref 3.87–5.11)
RDW: 13.4 % (ref 11.5–15.5)
WBC: 5.3 10*3/uL (ref 4.0–10.5)

## 2015-11-06 LAB — I-STAT TROPONIN, ED
Troponin i, poc: 0 ng/mL (ref 0.00–0.08)
Troponin i, poc: 0 ng/mL (ref 0.00–0.08)

## 2015-11-06 MED ORDER — ASPIRIN 81 MG PO CHEW
324.0000 mg | CHEWABLE_TABLET | Freq: Once | ORAL | Status: AC
  Administered 2015-11-06: 324 mg via ORAL
  Filled 2015-11-06: qty 4

## 2015-11-06 MED ORDER — ALUM & MAG HYDROXIDE-SIMETH 200-200-20 MG/5ML PO SUSP
30.0000 mL | Freq: Once | ORAL | Status: AC
  Administered 2015-11-06: 30 mL via ORAL
  Filled 2015-11-06: qty 30

## 2015-11-06 NOTE — ED Notes (Signed)
Pt left cell phone and eye glass case; Items placed in a bag with sticker and sitting at RN station for pt return to pick up

## 2015-11-06 NOTE — ED Notes (Signed)
V/M left for patient and sister to inform that items were left

## 2015-11-06 NOTE — Telephone Encounter (Signed)
Patient Name: Sandra Craig Gender: Female DOB: 1941/03/19 Age: 75 Y 86 M 5 D Return Phone Number: [phone] (Primary) Address: [address] [place] City/State/Zip: [place] Client [facility] Night - Client Client Site [facility] - Night Physician Shawna Orleans, Doe Kingsley Plan "Rob" Contact Type Call Who Is Calling Patient / Member / Family / Caregiver Call Type Triage / Clinical Relationship To Patient Self Return Phone Number [phone] (Primary) Chief Complaint CHEST PAIN (>=21 years) - pain, pressure, heaviness or tightness Reason for Call Symptomatic / Request for Health Information Initial Comment Caller states shakes, chills, headache, nausea, feels like skin is burning, chest feels heavy, BP 158/88 p. 77, took nighttime meds around 10 pm (calcium w/D, Zoloft, Ambien, and 2 others for lungs), unable to sleep PreDisposition Go to ED Translation No Nurse Assessment Nurse: Koleen Nimrod, RN, [name] Date/Time ([place] Time): 11/06/2015 12:51:17 AM Confirm and document reason for call. If symptomatic, describe symptoms. You must click the next button to save text entered. ---took medication at 2200. now shaking, have a headache, chest is burning, skin feels hot. feet and legs and chest feels like they are burning. diarrhea this am. feels nauseous now. Cant find a thermometer. does not think she has a fever. no vomiting. Has the patient traveled out of the country within the last 30 days? ---No Does the patient have any new or worsening symptoms? ---Yes Will a triage be completed? ---Yes Related visit to physician within the last 2 weeks? ---No Does the PT have any chronic conditions? (i.e. diabetes, asthma, etc.) ---Yes List chronic conditions. ---polycystic kidney disease. hypertension. high cholesterol. Is this a behavioral health or substance abuse call? ---No Guidelines Guideline Title Affirmed Question Affirmed Notes  Nurse Date/Time (Eastern Time) Chest Pain [1] Chest pain lasts > 5 minutes AND [2] described as crushing, pressure-like, or heavy Koleen Nimrod, RN, [name] 11/06/2015 12:55:51 AM PLEASE NOTE: All timestamps contained within this report are represented as [place] Standard Time. CONFIDENTIALTY NOTICE: This fax transmission is intended only for the addressee. It contains information that is legally privileged, confidential or otherwise protected from use or disclosure. If you are not the intended recipient, you are strictly prohibited from reviewing, disclosing, copying using or disseminating any of this information or taking any action in reliance on or regarding this information. If you have received this fax in error, please notify us immediately by telephone so that we can arrange for its return to [place]. Phone: [phone], Toll-Free: [phone], Fax: [phone] Page: 2 of 2 Call Id: [id number] [facility]. Time Eilene Ghazi Time) Disposition Final User 11/06/2015 12:49:41 AM Send to Urgent Jola Baptist 11/06/2015 1:00:32 AM Call EMS 911 Now Koleen Nimrod, RN, [facility] 11/06/2015 1:09:40 AM 911 Outcome Documentation Yes Koleen Nimrod, RN, [name] Reason: pt is still deciding on how to go to the emergency room. encouraged to go asap. states understanding Caller Understands: Yes Disagree/Comply: Comply Care Advice Given Per Guideline CALL EMS 911 NOW: Immediate medical attention is needed. You need to hang up and call 911 (or an ambulance). Psychologist, forensic Discretion: I'll call you back in a few minutes to be sure you were able to reach them.) CARE ADVICE given per Chest Pain (Adult) guideline. Referrals GO TO FACILITY UNDECIDED

## 2015-11-06 NOTE — ED Notes (Signed)
Pt reports she was trying to go to sleep last night when she began having substernal chest pain, radiating to the back and down the left arm. Pt reports SOB and nausea with the pain. Pt reports again having chest pains this AM then decided to come to ER. VSS.

## 2015-11-06 NOTE — Discharge Instructions (Signed)
Nonspecific Chest Pain  °Chest pain can be caused by many different conditions. There is always a chance that your pain could be related to something serious, such as a heart attack or a blood clot in your lungs. Chest pain can also be caused by conditions that are not life-threatening. If you have chest pain, it is very important to follow up with your health care provider. °CAUSES  °Chest pain can be caused by: °· Heartburn. °· Pneumonia or bronchitis. °· Anxiety or stress. °· Inflammation around your heart (pericarditis) or lung (pleuritis or pleurisy). °· A blood clot in your lung. °· A collapsed lung (pneumothorax). It can develop suddenly on its own (spontaneous pneumothorax) or from trauma to the chest. °· Shingles infection (varicella-zoster virus). °· Heart attack. °· Damage to the bones, muscles, and cartilage that make up your chest wall. This can include: °¨ Bruised bones due to injury. °¨ Strained muscles or cartilage due to frequent or repeated coughing or overwork. °¨ Fracture to one or more ribs. °¨ Sore cartilage due to inflammation (costochondritis). °RISK FACTORS  °Risk factors for chest pain may include: °· Activities that increase your risk for trauma or injury to your chest. °· Respiratory infections or conditions that cause frequent coughing. °· Medical conditions or overeating that can cause heartburn. °· Heart disease or family history of heart disease. °· Conditions or health behaviors that increase your risk of developing a blood clot. °· Having had chicken pox (varicella zoster). °SIGNS AND SYMPTOMS °Chest pain can feel like: °· Burning or tingling on the surface of your chest or deep in your chest. °· Crushing, pressure, aching, or squeezing pain. °· Dull or sharp pain that is worse when you move, cough, or take a deep breath. °· Pain that is also felt in your back, neck, shoulder, or arm, or pain that spreads to any of these areas. °Your chest pain may come and go, or it may stay  constant. °DIAGNOSIS °Lab tests or other studies may be needed to find the cause of your pain. Your health care provider may have you take a test called an ambulatory ECG (electrocardiogram). An ECG records your heartbeat patterns at the time the test is performed. You may also have other tests, such as: °· Transthoracic echocardiogram (TTE). During echocardiography, sound waves are used to create a picture of all of the heart structures and to look at how blood flows through your heart. °· Transesophageal echocardiogram (TEE). This is a more advanced imaging test that obtains images from inside your body. It allows your health care provider to see your heart in finer detail. °· Cardiac monitoring. This allows your health care provider to monitor your heart rate and rhythm in real time. °· Holter monitor. This is a portable device that records your heartbeat and can help to diagnose abnormal heartbeats. It allows your health care provider to track your heart activity for several days, if needed. °· Stress tests. These can be done through exercise or by taking medicine that makes your heart beat more quickly. °· Blood tests. °· Imaging tests. °TREATMENT  °Your treatment depends on what is causing your chest pain. Treatment may include: °· Medicines. These may include: °¨ Acid blockers for heartburn. °¨ Anti-inflammatory medicine. °¨ Pain medicine for inflammatory conditions. °¨ Antibiotic medicine, if an infection is present. °¨ Medicines to dissolve blood clots. °¨ Medicines to treat coronary artery disease. °· Supportive care for conditions that do not require medicines. This may include: °¨ Resting. °¨ Applying heat   or cold packs to injured areas. °¨ Limiting activities until pain decreases. °HOME CARE INSTRUCTIONS °· If you were prescribed an antibiotic medicine, finish it all even if you start to feel better. °· Avoid any activities that bring on chest pain. °· Do not use any tobacco products, including  cigarettes, chewing tobacco, or electronic cigarettes. If you need help quitting, ask your health care provider. °· Do not drink alcohol. °· Take medicines only as directed by your health care provider. °· Keep all follow-up visits as directed by your health care provider. This is important. This includes any further testing if your chest pain does not go away. °· If heartburn is the cause for your chest pain, you may be told to keep your head raised (elevated) while sleeping. This reduces the chance that acid will go from your stomach into your esophagus. °· Make lifestyle changes as directed by your health care provider. These may include: °¨ Getting regular exercise. Ask your health care provider to suggest some activities that are safe for you. °¨ Eating a heart-healthy diet. A registered dietitian can help you to learn healthy eating options. °¨ Maintaining a healthy weight. °¨ Managing diabetes, if necessary. °¨ Reducing stress. °SEEK MEDICAL CARE IF: °· Your chest pain does not go away after treatment. °· You have a rash with blisters on your chest. °· You have a fever. °SEEK IMMEDIATE MEDICAL CARE IF:  °· Your chest pain is worse. °· You have an increasing cough, or you cough up blood. °· You have severe abdominal pain. °· You have severe weakness. °· You faint. °· You have chills. °· You have sudden, unexplained chest discomfort. °· You have sudden, unexplained discomfort in your arms, back, neck, or jaw. °· You have shortness of breath at any time. °· You suddenly start to sweat, or your skin gets clammy. °· You feel nauseous or you vomit. °· You suddenly feel light-headed or dizzy. °· Your heart begins to beat quickly, or it feels like it is skipping beats. °These symptoms may represent a serious problem that is an emergency. Do not wait to see if the symptoms will go away. Get medical help right away. Call your local emergency services (911 in the [place] Do not drive yourself to the hospital. °  °This  information is not intended to replace advice given to you by your health care provider. Make sure you discuss any questions you have with your health care provider. °  °Document Released: 04/27/2005 Document Revised: 08/08/2014 Document Reviewed: 02/21/2014 °Elsevier Interactive Patient Education ©2016 [employer] ° °

## 2015-11-06 NOTE — ED Provider Notes (Signed)
CSN: [id number]     Arrival date & time 11/06/15  1441 History   First MD Initiated Contact with Patient 11/06/15 1645     Chief Complaint  Patient presents with  . Chest Pain     (Consider location/radiation/quality/duration/timing/severity/associated sxs/prior Treatment) HPI  Patient is a fairly healthy 75 year old female that presents complaining of chest pain. She had one episode of chest pain last night that she described as heaviness in her left upper chest with radiation to her left arm. It lasted about an hour and resolve spontaneously. She said she felt fine all morning until several hours ago when she had another episode of chest pain that felt like heaviness in the left side of her chest, without any radiation, no associated diaphoresis or nausea. She has no history of coronary artery disease, and reports that she had  A stress test a few years ago that was normal.  She has not had similar episodes in the past. She has no cough, no hemoptysis, no shortness of breath. She has had no unilateral leg swelling, or any prolonged immobilzation.  Past Medical History  Diagnosis Date  . IBS (irritable bowel syndrome)   . Hypertension   . Hyperlipidemia   . IC (interstitial cystitis)   . Endometriosis   . Polycystic kidney disease   . Osteopenia   . Cystitis   . Fibromyalgia   . Palpitations   . Chronic insomnia   . Pulmonary nodule 12/08    5 mm Anterior RUL  . Hiatal hernia   . Diverticulosis of colon (without mention of hemorrhage)   . Internal hemorrhoid   . Family history of malignant neoplasm of gastrointestinal tract   . Gastritis   . Small bowel obstruction ([place])   . Osteonecrosis ([place])   . History of gallstones   . Diverticulitis of intestine without perforation or abscess without bleeding     Patient did have abscess but noperforation  . 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 Osteoporosis   . HIATAL HERNIA 10/08/2008    Qualifier: Diagnosis of  By:  Nils Pyle CMA Deborra Medina), Mearl Latin     Past Surgical History  Procedure Laterality Date  . Cholecystectomy    . Pelvic laparoscopy    . Abdominal hysterectomy  1994    TAH,BSO FOR ENDOMETRIOSIS  . Oophorectomy  1994    TAH,BSO  . Total hip arthroplasty  FALL OF 2008    rt. partial hip replacement  . Abdominal surgery  2011    small intestine blockage  . S/p right shoulder rotater cuff  200216/2011    Tear/adhesive capsulitis  . Sbo lap  11    Adhesions and small internal hernia  . Joint replacement  2008  . Gastroplasty  2011    small bowel resection -open  . Total shoulder arthroplasty  06/05/2012    Procedure: TOTAL SHOULDER ARTHROPLASTY;  Surgeon: Johnny Bridge, MD;  Location: [facility];  Service: Orthopedics;  Laterality: Right;  RIGHT SHOULDER TOTAL ARTHROPLASTY, HEMIARTHROPLASTY, SHOULDER, FOR ARTHRITIS  . Shoulder hemi-arthroplasty  06/05/2012    Procedure: SHOULDER HEMI-ARTHROPLASTY;  Surgeon: Johnny Bridge, MD;  Location: [facility];  Service: Orthopedics;  Laterality: Right;  FOR ARTHRITIS  . Video bronchoscopy Bilateral 01/28/2015    Procedure: VIDEO BRONCHOSCOPY WITH FLUORO;  Surgeon: Collene Gobble, MD;  Location: [facility];  Service: Cardiopulmonary;  Laterality: Bilateral;   Family History  Problem Relation Age of Onset  . Heart disease Mother     MI  at age 63  . Lymphoma Maternal Grandmother   . Colon cancer Paternal Grandmother   . COPD Father   . Emphysema Mother   . Thyroid disease Mother     Thyroidectomy/Benign  . Cancer Brother     adenocarcinoma right lung   Social History  Substance Use Topics  . Smoking status: Former Smoker -- 0.75 packs/day for 8 years    Types: Cigarettes    Quit date: 08/01/1976  . Smokeless tobacco: Never Used     Comment: Quit in 1978  . Alcohol Use: No   OB History    Gravida Para Term Preterm AB TAB SAB Ectopic Multiple Living   1 0   1  1   0     Review of Systems  Constitutional: Negative for fever and fatigue.   Respiratory: Negative for cough, chest tightness and shortness of breath.   Cardiovascular: Positive for chest pain. Negative for palpitations and leg swelling.  Gastrointestinal: Negative for nausea and vomiting.  All other systems reviewed and are negative.     Allergies  Azithromycin; Celecoxib; Prednisone; and Sulfonamide derivatives  Home Medications   Prior to Admission medications   Medication Sig Start Date End Date Taking? Authorizing Provider  Calcium Carbonate-Vit D-Min (CALCIUM 1200 PO) Take 2 capsules by mouth daily.    Yes Historical Provider, MD  clarithromycin (BIAXIN) 500 MG tablet One three times weekly Patient taking differently: Take 500 mg by mouth. One three times weekly 04/15/15  Yes Elsie Stain, MD  fluticasone [facility]) 50 MCG/ACT nasal spray SHAKE WELL AND USE 2 SPRAYS IN EACH NOSTRIL DAILY 07/29/15  Yes Juanito Doom, MD  gabapentin (NEURONTIN) 300 MG capsule Take 1 capsule (300 mg total) by mouth 2 (two) times daily. 08/19/15  Yes Doe-Hyun R Shawna Orleans, DO  losartan (COZAAR) 50 MG tablet Take 1 tablet (50 mg total) by mouth daily. 08/19/15  Yes Doe-Hyun R Shawna Orleans, DO  ranitidine (ZANTAC) 150 MG capsule Take 150 mg by mouth daily.   Yes Historical Provider, MD  rifampin (RIFADIN) 300 MG capsule Take 2 capsules (600 mg total) by mouth 3 (three) times a week. Patient taking differently: Take 600 mg by mouth 3 (three) times a week. Week 03/16/15  Yes Elsie Stain, MD  rosuvastatin (CRESTOR) 20 MG tablet Take 1 tablet (20 mg total) by mouth daily. 08/19/15  Yes Doe-Hyun R Shawna Orleans, DO  sertraline (ZOLOFT) 100 MG tablet Take 1.5 tablets (150 mg total) by mouth daily. 08/19/15  Yes Doe-Hyun R Shawna Orleans, DO  zolpidem (AMBIEN) 10 MG tablet Take 1 tablet (10 mg total) by mouth at bedtime as needed. For sleep. 08/19/15  Yes Doe-Hyun Kyra Searles, DO  HYDROcodone-acetaminophen (NORCO/VICODIN) 5-325 MG tablet Take 1 Tab PO EVERY 12 HOURS AS NEEDED FOR PAIN Patient not taking: Reported on  11/06/2015 10/15/15   [name], MD   BP 132/76 mmHg  Pulse 89  Temp(Src) 99 F (37.2 C) (Oral)  Resp 18  SpO2 98%  LMP 08/25/1992 Physical Exam  Constitutional: She is oriented to person, place, and time. She appears well-developed and well-nourished. No distress.  HENT:  Head: Normocephalic and atraumatic.  Eyes: Pupils are equal, round, and reactive to light.  Neck: Normal range of motion. Neck supple. No JVD present.  Cardiovascular: Normal rate, regular rhythm and normal heart sounds.   Pulmonary/Chest: Effort normal and breath sounds normal. She has no wheezes.  Abdominal: Soft. She exhibits no mass. There is no tenderness.  Musculoskeletal: Normal  range of motion. She exhibits no edema.  Neurological: She is alert and oriented to person, place, and time.  Skin: Skin is warm and dry.  Psychiatric: She has a normal mood and affect.  Nursing note and vitals reviewed.   ED Course  Procedures (including critical care time) Labs Review Labs Reviewed  BASIC METABOLIC PANEL - Abnormal; Notable for the following:    BUN 22 (*)    Creatinine, Ser 1.48 (*)    GFR calc non Af Amer 34 (*)    GFR calc Af Amer 39 (*)    All other components within normal limits  CBC  I-STAT TROPOININ, ED    Imaging Review Dg Chest 2 View  11/06/2015  CLINICAL DATA:  Chest pain since yesterday traveling down LEFT arm, slight shortness of breath last night, hypertension, former smoker EXAM: CHEST  2 VIEW COMPARISON:  01/28/2015 FINDINGS: Normal heart size, mediastinal contours, and pulmonary vascularity. Lungs emphysematous but clear. No infiltrate, pleural effusion or pneumothorax. Bones demineralized. Post RIGHT shoulder arthroplasty. IMPRESSION: COPD changes. No acute abnormalities. Electronically Signed   By: Lavonia Dana M.D.   On: 11/06/2015 15:25   I have personally reviewed and evaluated these images and lab results as part of my medical decision-making.   EKG  Interpretation   Date/Time:  Friday November 06 2015 14:48:38 EDT Ventricular Rate:  86 PR Interval:  148 QRS Duration: 74 QT Interval:  372 QTC Calculation: 445 R Axis:   8 Text Interpretation:  Normal sinus rhythm Nonspecific ST and T wave  abnormality Abnormal ECG No acute changes from prior  Confirmed by LIU MD,  DANA ([id number]) on 11/06/2015 4:50:44 PM      MDM   Final diagnoses:  Other chest pain     Patient presents for evaluation of chest pain. She appears well, is in no distress on arrival. She has normal bilateral upper extremity pulses, and chest x-ray does not show any evidence of widened mediastinum, and she does not describe the pain as tearing, or radiating into her back to concern me for aortic dissection. EKG shows no signs of acute ischemia. HEENT are score 3, she is at low risk for major adverse cardiac events in the next 30 days, and has had 2 undetectable troponins here in the emergency department.  EKG was repeated and also was unchanged from previous. She has no abdominal pain, nausea or vomiting, and a normal abdominal exam.  Chest x-ray was obtained and does not show any signs of pneumothorax, rib fracture, or other emergency. Follow-up was discussed with her, and return precautions were discussed as well. She'll follow up with her PCP.    Leata Mouse, MD 11/07/15 0370  Forde Dandy, MD 11/07/15 [phone]

## 2015-11-06 NOTE — Telephone Encounter (Signed)
Spoke to the pt.  She continues to have the burning pain in her chest along with nausea and diarrhea.  Shaking and skin burning has resolved.  I had her check bp while on the phone.  She used a wrist cuff and bp was 126/79.  HR of 82.  Overall she is feeling much better and believes she "has a bug."  Requests to see Dr. Yong Channel since Dr. Shawna Orleans is unavailable.  Will be in [place] after 2 PM today.  Appt scheduled at 3:45 with Dr. Yong Channel.

## 2015-12-07 ENCOUNTER — Other Ambulatory Visit (INDEPENDENT_AMBULATORY_CARE_PROVIDER_SITE_OTHER): Payer: Medicare Other

## 2015-12-07 ENCOUNTER — Ambulatory Visit: Payer: Self-pay | Admitting: Pulmonary Disease

## 2015-12-07 ENCOUNTER — Ambulatory Visit (INDEPENDENT_AMBULATORY_CARE_PROVIDER_SITE_OTHER): Payer: Medicare Other | Admitting: Pulmonary Disease

## 2015-12-07 ENCOUNTER — Encounter: Payer: Self-pay | Admitting: Pulmonary Disease

## 2015-12-07 VITALS — BP 126/68 | HR 66 | Ht 66.0 in | Wt 133.0 lb

## 2015-12-07 DIAGNOSIS — Z5181 Encounter for therapeutic drug level monitoring: Secondary | ICD-10-CM | POA: Diagnosis not present

## 2015-12-07 DIAGNOSIS — A310 Pulmonary mycobacterial infection: Secondary | ICD-10-CM

## 2015-12-07 DIAGNOSIS — R918 Other nonspecific abnormal finding of lung field: Secondary | ICD-10-CM

## 2015-12-07 LAB — HEPATIC FUNCTION PANEL
ALT: 11 U/L (ref 0–35)
AST: 16 U/L (ref 0–37)
Albumin: 4.2 g/dL (ref 3.5–5.2)
Alkaline Phosphatase: 52 U/L (ref 39–117)
Bilirubin, Direct: 0.1 mg/dL (ref 0.0–0.3)
Total Bilirubin: 0.4 mg/dL (ref 0.2–1.2)
Total Protein: 6.8 g/dL (ref 6.0–8.3)

## 2015-12-07 NOTE — Patient Instructions (Addendum)
It was great meeting you today. Continue taking your clarithromycin and rifampin  as prescribed. We will do Liver function labs today. We will call you the results. We will schedule a CT Chest without contrast for June 2017. Follow up with Dr. Lake Bells in 3 months.  Please contact office for sooner follow up if symptoms do not improve or worsen or seek emergency care

## 2015-12-07 NOTE — Progress Notes (Signed)
Subjective:    Patient ID: Sandra Craig, female    DOB: 1941-01-02, 75 y.o.   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 She was initially followed for a pulmonary nodule in 2008. She used to smoke and quit in 1977 after ten years. < 1 ppd.  She was diagnosed with a bronchoscopy in June 2016. A CT scan in 2016 showed characteristic tree in bud abnormalities which were worrisome for Mycobacterium avium intercellular. She started 3 drug therapy in August 2016, but had to stop azithromycin after 2 weeks because of a rash. Started clarithomycin.   12/07/2015 Follow up appointment for MAC: Pt. Is here for follow up for MAC treatment.She is doing well. No further problems with her medication regimen. We discussed that she will complete treatment in August 2017. She asked great questions about follow up after treatment.We will get a repeat CT chest no contrast as nodule follow up. She denies chest pain, fever,orthopnea, worsening cough or purulent sputum.   Current outpatient prescriptions:  .  Calcium Carbonate-Vit D-Min (CALCIUM 1200 PO), Take 2 capsules by mouth daily. , Disp: , Rfl:  .  clarithromycin (BIAXIN) 500 MG tablet, One three times weekly (Patient taking differently: Take 500 mg by mouth. One three times weekly), Disp: 40 tablet, Rfl: 4 .  fluticasone (FLONASE) 50 MCG/ACT nasal spray, SHAKE WELL AND USE 2 SPRAYS IN EACH NOSTRIL DAILY, Disp: 48 g, Rfl: 0 .  gabapentin (NEURONTIN) 300 MG capsule, Take 1 capsule (300 mg total) by mouth 2 (two) times daily., Disp: 180 capsule, Rfl: 1 .  losartan (COZAAR) 50 MG tablet, Take 1 tablet (50 mg total) by mouth daily., Disp: 90 tablet, Rfl: 1 .  ranitidine (ZANTAC) 150 MG capsule, Take 150 mg by mouth daily., Disp: , Rfl:  .  rifampin (RIFADIN) 300 MG capsule, Take 2 capsules (600 mg total) by mouth 3 (three) times a week. (Patient taking differently: Take 600 mg by mouth 3 (three)  times a week. Week), Disp: 60 capsule, Rfl: 6 .  rosuvastatin (CRESTOR) 20 MG tablet, Take 1 tablet (20 mg total) by mouth daily., Disp: 90 tablet, Rfl: 1 .  sertraline (ZOLOFT) 100 MG tablet, Take 1.5 tablets (150 mg total) by mouth daily., Disp: 135 tablet, Rfl: 1 .  zolpidem (AMBIEN) 10 MG tablet, Take 1 tablet (10 mg total) by mouth at bedtime as needed. For sleep., Disp: 90 tablet, Rfl: 1   Past Medical History  Diagnosis Date  . IBS (irritable bowel syndrome)   . Hypertension   . Hyperlipidemia   . IC (interstitial cystitis)   . Endometriosis   . Polycystic kidney disease   . Osteopenia   . Cystitis   . Fibromyalgia   . Palpitations   . Chronic insomnia   . Pulmonary nodule 12/08    5 mm Anterior RUL  . Hiatal hernia   . Diverticulosis of colon (without mention of hemorrhage)   . Internal hemorrhoid   . Family history of malignant neoplasm of gastrointestinal tract   . Gastritis   . Small bowel obstruction ([place])   . Osteonecrosis ([place])   . History of gallstones   . Diverticulitis of intestine without perforation or abscess without bleeding     Patient did have abscess but noperforation  . 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 Osteoporosis   . HIATAL HERNIA 10/08/2008    Qualifier: Diagnosis of  By: Nils Pyle CMA (AAMA), Mearl Latin  Allergies  Allergen Reactions  . Azithromycin Rash    Pruritic rash diffuse   . Celecoxib   . Prednisone Other (See Comments)    Made pt feel "crazy"  . Sulfonamide Derivatives       Review of Systems Constitutional:   No  weight loss, night sweats,  Fevers, chills, fatigue, or  lassitude.  HEENT:   No headaches,  Difficulty swallowing,  Tooth/dental problems, or  Sore throat,                No sneezing, itching, ear ache, nasal congestion, post nasal drip,   CV:  No chest pain,  Orthopnea, PND, swelling in lower extremities, anasarca, dizziness, palpitations, syncope.   GI  No heartburn, indigestion, abdominal  pain, nausea, vomiting, diarrhea, change in bowel habits, loss of appetite, bloody stools.   Resp: No shortness of breath with exertion or at rest.  No excess mucus, no productive cough,  No non-productive cough,  No coughing up of blood.  No change in color of mucus.  No wheezing.  No chest wall deformity  Skin: no rash or lesions.  GU: no dysuria, change in color of urine, no urgency or frequency.  No flank pain, no hematuria   MS:  No joint pain or swelling.  No decreased range of motion.  No back pain.  Psych:  No change in mood or affect. No depression or anxiety.  No memory loss.        Objective:   Physical Exam BP 126/68 mmHg  Pulse 66  Ht '5\' 6"'$  (1.676 m)  Wt 133 lb (60.328 kg)  BMI 21.48 kg/m2  SpO2 98%  LMP 08/25/1992   Physical Exam:  General- No distress,  A&Ox3, pleasant ENT: No sinus tenderness, TM clear, pale nasal mucosa, no oral exudate,no post nasal drip, no LAN Cardiac: S1, S2, regular rate and rhythm, no murmur Chest: No wheeze/ rales/ dullness; no accessory muscle use, no nasal flaring, no sternal retractions Abd.: Soft Non-tender Ext: No clubbing cyanosis, edema Neuro:  normal strength Skin: No rashes, warm and dry Psych: normal mood and behavior  Magdalen Spatz, AGACNP-BC [facility] 12/07/2015      Assessment & Plan:  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 We will do Liver function labs today. We will call you the results. We will schedule a CT Chest without contrast for June 2017. Follow up with Dr. Lake Bells in 3 months.  Please contact office for sooner follow up if symptoms do not improve or worsen or seek emergency care     Attending:  I have seen and Examined the patient with Judson Roch gross and I agree with the findings from her note  Sandra Craig is doing extremely well No respiratory complaints We  plan to continue treatment through August 2017 We will plan for a CT chest in June  Roselie Awkward, MD [facility] Pager: [phone] Cell: [phone] After 3pm or if no response, call [phone]    Current outpatient prescriptions:  .  Calcium Carbonate-Vit D-Min (CALCIUM 1200 PO), Take 2 capsules by mouth daily. , Disp: , Rfl:  .  clarithromycin (BIAXIN) 500 MG tablet, One three times weekly (Patient taking differently: Take 500 mg by mouth. One three times weekly), Disp: 40 tablet, Rfl: 4 .  fluticasone (FLONASE) 50 MCG/ACT nasal spray, SHAKE WELL AND USE 2 SPRAYS IN EACH NOSTRIL DAILY, Disp: 48 g, Rfl: 0 .  gabapentin (NEURONTIN) 300 MG capsule, Take 1 capsule (300 mg total) by mouth 2 (two) times daily., Disp: 180 capsule, Rfl: 1 .  losartan (COZAAR) 50 MG tablet, Take 1 tablet (50 mg total) by mouth daily., Disp: 90 tablet, Rfl: 1 .  ranitidine (ZANTAC) 150 MG capsule, Take 150 mg by mouth daily., Disp: , Rfl:  .  rifampin (RIFADIN) 300 MG capsule, Take 2 capsules (600 mg total) by mouth 3 (three) times a week. (Patient taking differently: Take 600 mg by mouth 3 (three) times a week. Week), Disp: 60 capsule, Rfl: 6 .  rosuvastatin (CRESTOR) 20 MG tablet, Take 1 tablet (20 mg total) by mouth daily., Disp: 90 tablet, Rfl: 1 .  sertraline (ZOLOFT) 100 MG tablet, Take 1.5 tablets (150 mg total) by mouth daily., Disp: 135 tablet, Rfl: 1 .  zolpidem (AMBIEN) 10 MG tablet, Take 1 tablet (10 mg total) by mouth at bedtime as needed. For sleep., Disp: 90 tablet, Rfl: 1

## 2015-12-07 NOTE — Assessment & Plan Note (Signed)
Follow Up MAI Plan: Continue taking your clarithromycin and rifampin  as prescribed. We will do Liver function labs today. We will call you the results. We will schedule a CT Chest without contrast for June 2017. Follow up with Dr. Lake Bells in 3 months.  Please contact office for sooner follow up if symptoms do not improve or worsen or seek emergency care

## 2015-12-08 ENCOUNTER — Ambulatory Visit (INDEPENDENT_AMBULATORY_CARE_PROVIDER_SITE_OTHER): Payer: Medicare Other | Admitting: Family Medicine

## 2015-12-08 ENCOUNTER — Encounter: Payer: Self-pay | Admitting: Family Medicine

## 2015-12-08 VITALS — BP 110/60 | HR 83 | Temp 98.3°F | Wt 134.0 lb

## 2015-12-08 DIAGNOSIS — I10 Essential (primary) hypertension: Secondary | ICD-10-CM

## 2015-12-08 DIAGNOSIS — E785 Hyperlipidemia, unspecified: Secondary | ICD-10-CM

## 2015-12-08 DIAGNOSIS — R0789 Other chest pain: Secondary | ICD-10-CM

## 2015-12-08 DIAGNOSIS — M545 Low back pain: Secondary | ICD-10-CM | POA: Diagnosis not present

## 2015-12-08 DIAGNOSIS — G4700 Insomnia, unspecified: Secondary | ICD-10-CM

## 2015-12-08 DIAGNOSIS — G8929 Other chronic pain: Secondary | ICD-10-CM

## 2015-12-08 MED ORDER — GABAPENTIN 300 MG PO CAPS: 300.0000 mg | ORAL_CAPSULE | Freq: Every day | ORAL | Status: DC

## 2015-12-08 NOTE — Assessment & Plan Note (Signed)
S: Patient is interested in reducing her dose of medication. We discussed that she is above Max dose recommended for age and gender. Even with this sometimes she struggles to sleep at night A/P: We are going to try 5 mg of Ambien by taking half tablet-if this is effective we will change to 5 mg prescription in the future. Could also discuss options such as trazodone or doxepin.

## 2015-12-08 NOTE — Assessment & Plan Note (Signed)
S: Last visit we decided to trial gabapentin once a night instead of twice a day. The patient got home she realized she had only been taking it once a day. We refilled hydrocodone and encouraged the idea that less frequent use may actually be more beneficial. She tried it every other day and noticed significant improvement at times that she did take. She is trying to wean herself further and has not had a single hydrocodone since April 24. She's been taking up to 5 Tylenol a day which has been beneficial to her. She is no longer taking baclofen. Continues to have bilateral low back pain worse on left side with some mild radiculopathy. See prior note for previous interventions. She has not tried aqua therapy, acupuncture, hypnosis for pain management but has been doing better so did not more strongly advocate. See prior notes for prior interventions. A/P: Very pleased with patient's progress. Continue 1 gabapentin at night. Continue hydrocodone sparingly. I'm willing to refill it using once a day or less but obviously the less the better

## 2015-12-08 NOTE — Patient Instructions (Addendum)
So glad you are doing better despite not taking as much hydrocodone. Ok to use sparingly- as you have noticed the less you use it the more effective it is. Ok to use tylenol as needed before taking hydrocodone. Glad no baclofen needed  Continue with gabapentin at night only  Trial 1/2 ambien- we can change to '5mg'$  if this works  If you have recurrence of chest pain- you agreed to following up emergently then planning on cardiology consult after that. We held off due to issues with contrast and your kidneys for now

## 2015-12-08 NOTE — Progress Notes (Signed)
Subjective:  Sandra Craig is a 75 y.o. year old very pleasant female patient who presents for/with See problem oriented charting ROS- No chest pain (since early last month)or shortness of breath. No headache or blurry vision. .see any ROS included in HPI as well.   Past Medical History-  Patient Active Problem List   Diagnosis Date Noted  . 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 Chronic low back pain 12/20/2013    Priority: High  . Fibromyalgia 12/05/2007    Priority: High  . History of small bowel obstruction 06/28/2011    Priority: Medium  . GERD with stricture 06/28/2011    Priority: Medium  . Polycystic kidney disease     Priority: Medium  . Hypertension     Priority: Medium  . Hyperlipidemia     Priority: Medium  . Osteoporosis 12/21/2009    Priority: Medium  . INSOMNIA, CHRONIC 10/08/2008    Priority: Medium  . Depression 02/08/2008    Priority: Medium  . Chronic right shoulder pain 03/07/2014    Priority: Low  . Benign essential tremor 10/09/2013    Priority: Low  . Hyperglycemia 06/27/2012    Priority: Low  . Diverticulitis large intestine 02/11/2012    Priority: Low  . Irritable bowel syndrome (IBS) 06/28/2011    Priority: Low  . IC (interstitial cystitis)     Priority: Low  . Hemorrhoids 10/08/2008    Priority: Low  . 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 HEMATURIA UNSPECIFIED 07/04/2008    Priority: Low  . Lung nodules 08/09/2007    Priority: Low    Medications- reviewed and updated Current Outpatient Prescriptions  Medication Sig Dispense Refill  . Calcium Carbonate-Vit D-Min (CALCIUM 1200 PO) Take 2 capsules by mouth daily.     . clarithromycin (BIAXIN) 500 MG tablet One three times weekly (Patient taking differently: Take 500 mg by mouth. One three times weekly) 40 tablet 4  . fluticasone (FLONASE) 50 MCG/ACT nasal spray SHAKE WELL AND USE 2 SPRAYS IN EACH  NOSTRIL DAILY 48 g 0  . gabapentin (NEURONTIN) 300 MG capsule Take 1 capsule (300 mg total) by mouth at bedtime. 90 capsule 3  . losartan (COZAAR) 50 MG tablet Take 1 tablet (50 mg total) by mouth daily. 90 tablet 1  . ranitidine (ZANTAC) 150 MG capsule Take 150 mg by mouth daily.    . rifampin (RIFADIN) 300 MG capsule Take 2 capsules (600 mg total) by mouth 3 (three) times a week. (Patient taking differently: Take 600 mg by mouth 3 (three) times a week. Week) 60 capsule 6  . rosuvastatin (CRESTOR) 20 MG tablet Take 1 tablet (20 mg total) by mouth daily. 90 tablet 1  . sertraline (ZOLOFT) 100 MG tablet Take 1.5 tablets (150 mg total) by mouth daily. 135 tablet 1  . zolpidem (AMBIEN) 10 MG tablet Take 1 tablet (10 mg total) by mouth at bedtime as needed. For sleep. (Patient not taking: Reported on 12/08/2015) 90 tablet 1   No current facility-administered medications for this visit.    Objective: BP 110/60 mmHg  Pulse 83  Temp(Src) 98.3 F (36.8 C)  Wt 134 lb (60.782 kg)  LMP 08/25/1992 Gen: NAD, resting comfortably CV: RRR no murmurs rubs or gallops Lungs: CTAB no crackles, wheeze, rhonchi Abdomen: soft/nontender/nondistended/normal bowel sounds. No rebound or guarding.  Ext: no edema Skin: warm, dry Neuro: grossly normal, moves all extremities, nonantalgic gait  Assessment/Plan:  Hypertension S: controlled. On losartan '50mg'$   BP Readings from Last 3 Encounters:  12/08/15 110/60  12/07/15 126/68  11/06/15 133/72  A/P:Continue current meds:  Well controlled  Hyperlipidemia S: well controlled on crestor '20mg'$ . No myalgias.  Lab Results  Component Value Date   CHOL 154 03/04/2015   HDL 48.40 03/04/2015   LDLCALC 73 03/04/2015   LDLDIRECT 171.2 09/26/2006   TRIG 163.0* 03/04/2015   CHOLHDL 3 03/04/2015   A/P: continue current prescription, doing well  INSOMNIA, CHRONIC S: Patient is interested in reducing her dose of medication. We discussed that she is above Max dose  recommended for age and gender. Even with this sometimes she struggles to sleep at night A/P: We are going to try 5 mg of Ambien by taking half tablet-if this is effective we will change to 5 mg prescription in the future. Could also discuss options such as trazodone or doxepin.   Chronic low back pain S: Last visit we decided to trial gabapentin once a night instead of twice a day. The patient got home she realized she had only been taking it once a day. We refilled hydrocodone and encouraged the idea that less frequent use may actually be more beneficial. She tried it every other day and noticed significant improvement at times that she did take. She is trying to wean herself further and has not had a single hydrocodone since April 24. She's been taking up to 5 Tylenol a day which has been beneficial to her. She is no longer taking baclofen. Continues to have bilateral low back pain worse on left side with some mild radiculopathy. See prior note for previous interventions. She has not tried aqua therapy, acupuncture, hypnosis for pain management but has been doing better so did not more strongly advocate. See prior notes for prior interventions. A/P: Very pleased with patient's progress. Continue 1 gabapentin at night. Continue hydrocodone sparingly. I'm willing to refill it using once a day or less but obviously the less the better  atypical chest pain S: Patient was seen in emergency room about a month ago. She had chest pain awoke her from sleep. No exertional component. She had 2 negative troponins and reassuring EKG. She tells me today that her pain resolved with GI cocktail. She's had no further pain. She denied any shortness of breath, left arm or jaw pain at that time. No diaphoresis. No dizziness. A/P: We discussed stress testing but with her polycystic kidney disease contrast is not ideal. Would want to refer to cardiology for discussion of stress test type. With her back issues not sure she  can tolerate a treadmill. She declined cardiology referral. She is aware of risks but she does agree that she has recurrent issues to seek care immediately and cardiology referral at that time. Most likely this was GI in origin  Return in about 3 months (around 03/09/2016). Return precautions advised.   Meds ordered this encounter  Medications  . gabapentin (NEURONTIN) 300 MG capsule    Sig: Take 1 capsule (300 mg total) by mouth at bedtime.    Dispense:  90 capsule    Refill:  3    Garret Reddish, MD

## 2015-12-08 NOTE — Assessment & Plan Note (Signed)
S: well controlled on crestor '20mg'$ . No myalgias.  Lab Results  Component Value Date   CHOL 154 03/04/2015   HDL 48.40 03/04/2015   LDLCALC 73 03/04/2015   LDLDIRECT 171.2 09/26/2006   TRIG 163.0* 03/04/2015   CHOLHDL 3 03/04/2015   A/P: continue current prescription, doing well

## 2015-12-08 NOTE — Assessment & Plan Note (Signed)
S: controlled. On losartan '50mg'$   BP Readings from Last 3 Encounters:  12/08/15 110/60  12/07/15 126/68  11/06/15 133/72  A/P:Continue current meds:  Well controlled

## 2015-12-25 DIAGNOSIS — Z1231 Encounter for screening mammogram for malignant neoplasm of breast: Secondary | ICD-10-CM | POA: Diagnosis not present

## 2015-12-25 LAB — HM MAMMOGRAPHY

## 2015-12-29 ENCOUNTER — Encounter: Payer: Self-pay | Admitting: Family Medicine

## 2015-12-29 ENCOUNTER — Ambulatory Visit (INDEPENDENT_AMBULATORY_CARE_PROVIDER_SITE_OTHER): Payer: Medicare Other | Admitting: Family Medicine

## 2015-12-29 VITALS — BP 94/60 | HR 89 | Temp 98.3°F | Ht 66.0 in | Wt 133.0 lb

## 2015-12-29 DIAGNOSIS — J209 Acute bronchitis, unspecified: Secondary | ICD-10-CM

## 2015-12-29 MED ORDER — AMOXICILLIN-POT CLAVULANATE 875-125 MG PO TABS: 1.0000 | ORAL_TABLET | Freq: Two times a day (BID) | ORAL | Status: DC

## 2015-12-29 NOTE — Progress Notes (Signed)
Subjective:  Sandra Craig is a 75 y.o. year old very pleasant female patient who presents for/with See problem oriented charting ROS- no fever or chills. Does have baseline shortness of breath. Chest pain protocol has resolved at this point.see any ROS included in HPI as well.   Past Medical History-  Patient Active Problem List   Diagnosis Date Noted  . 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 Chronic low back pain 12/20/2013    Priority: High  . Fibromyalgia 12/05/2007    Priority: High  . History of small bowel obstruction 06/28/2011    Priority: Medium  . GERD with stricture 06/28/2011    Priority: Medium  . Polycystic kidney disease     Priority: Medium  . Hypertension     Priority: Medium  . Hyperlipidemia     Priority: Medium  . Osteoporosis 12/21/2009    Priority: Medium  . INSOMNIA, CHRONIC 10/08/2008    Priority: Medium  . Depression 02/08/2008    Priority: Medium  . Chronic right shoulder pain 03/07/2014    Priority: Low  . Benign essential tremor 10/09/2013    Priority: Low  . Hyperglycemia 06/27/2012    Priority: Low  . Diverticulitis large intestine 02/11/2012    Priority: Low  . Irritable bowel syndrome (IBS) 06/28/2011    Priority: Low  . IC (interstitial cystitis)     Priority: Low  . Hemorrhoids 10/08/2008    Priority: Low  . 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 HEMATURIA UNSPECIFIED 07/04/2008    Priority: Low  . Lung nodules 08/09/2007    Priority: Low    Medications- reviewed and updated Current Outpatient Prescriptions  Medication Sig Dispense Refill  . Calcium Carbonate-Vit D-Min (CALCIUM 1200 PO) Take 2 capsules by mouth daily.     . clarithromycin (BIAXIN) 500 MG tablet One three times weekly (Patient taking differently: Take 500 mg by mouth. One three times weekly) 40 tablet 4  . fluticasone (FLONASE) 50 MCG/ACT nasal spray SHAKE WELL AND USE 2  SPRAYS IN EACH NOSTRIL DAILY 48 g 0  . gabapentin (NEURONTIN) 300 MG capsule Take 1 capsule (300 mg total) by mouth at bedtime. 90 capsule 3  . losartan (COZAAR) 50 MG tablet Take 1 tablet (50 mg total) by mouth daily. 90 tablet 1  . ranitidine (ZANTAC) 150 MG capsule Take 150 mg by mouth daily.    . rifampin (RIFADIN) 300 MG capsule Take 2 capsules (600 mg total) by mouth 3 (three) times a week. (Patient taking differently: Take 600 mg by mouth 3 (three) times a week. Week) 60 capsule 6  . rosuvastatin (CRESTOR) 20 MG tablet Take 1 tablet (20 mg total) by mouth daily. 90 tablet 1  . sertraline (ZOLOFT) 100 MG tablet Take 1.5 tablets (150 mg total) by mouth daily. 135 tablet 1  . zolpidem (AMBIEN) 10 MG tablet Take 1 tablet (10 mg total) by mouth at bedtime as needed. For sleep. 90 tablet 1  . amoxicillin-clavulanate (AUGMENTIN) 875-125 MG tablet Take 1 tablet by mouth 2 (two) times daily. 14 tablet 0   No current facility-administered medications for this visit.    Objective: BP 94/60 mmHg  Pulse 89  Temp(Src) 98.3 F (36.8 C) (Oral)  Ht '5\' 6"'$  (1.676 m)  Wt 133 lb (60.328 kg)  BMI 21.48 kg/m2  SpO2 95%  LMP 08/25/1992 Gen: NAD, resting comfortably, Appears fatigued Oropharynx normal, nares normal, tympanic membranes  normal CV: RRR no murmurs rubs or gallops Lungs: CTAB no crackles, wheeze, rhonchi Ext: no edema Skin: warm, dry Neuro: grossly normal, moves all extremities  Assessment/Plan:  3 weeks of cough concerning for Bronchitis  S: 3 weeks of cough. First had some tightness in chest and pain with coughing now resolved. Minimal wheeze. Did have some chest rattling. Continues to cough up yellow sputum. History of Mycobacterium avium intracellulare. ON clarithromycin 3x a week and rifampin 3x a week. Mild shortness of breath same as baseline. No fevers. Does not really have allergic symptoms such as sneezing, watery itchy eyes. Does not have sinus infection symptoms such as nasal  drainage or sinus pressure A/P: I'm concerned about bronchitis potentially. She does seem at least to be stable to slightly improving. We discussed most cases of bronchitis are viral and typically in normal patients with take 40 patients being treated with antibiotics to prevent 1 pneumonia. On the other hand she does have Mycobacterium avium intracellulare and with his baseline lung disease we ultimately decided to treat with Augmentin. Will reach out to pulmonology to make sure this does not interfere with their assessment and treatment plan of her MAI.   Return precautions advised.   Meds ordered this encounter  Medications  . amoxicillin-clavulanate (AUGMENTIN) 875-125 MG tablet    Sig: Take 1 tablet by mouth 2 (two) times daily.    Dispense:  14 tablet    Refill:  0  New acute issue. Medication management. High risk patient with MAI  Garret Reddish, MD

## 2015-12-29 NOTE — Patient Instructions (Signed)
Given your baseline lung disease I am going to be more aggressive than usual for potential bronchitis. Cover with augmentin for 7 days but I will also reach out to Dr. Lake Bells to make sure no overlap with his treatments or follow up he would need

## 2015-12-30 NOTE — Progress Notes (Signed)
Sounds like a good plan to me, thanks for keeping me in the loop

## 2016-01-01 ENCOUNTER — Encounter: Payer: Self-pay | Admitting: Family Medicine

## 2016-01-04 ENCOUNTER — Encounter: Payer: Self-pay | Admitting: Family Medicine

## 2016-01-08 ENCOUNTER — Ambulatory Visit (INDEPENDENT_AMBULATORY_CARE_PROVIDER_SITE_OTHER)
Admission: RE | Admit: 2016-01-08 | Discharge: 2016-01-08 | Disposition: A | Discharge status: Home / Self Care | Payer: Medicare Other | Source: Ambulatory Visit | Attending: Pulmonary Disease | Admitting: Pulmonary Disease

## 2016-01-08 DIAGNOSIS — A310 Pulmonary mycobacterial infection: Secondary | ICD-10-CM

## 2016-01-08 DIAGNOSIS — R918 Other nonspecific abnormal finding of lung field: Secondary | ICD-10-CM | POA: Diagnosis not present

## 2016-01-08 DIAGNOSIS — R911 Solitary pulmonary nodule: Secondary | ICD-10-CM | POA: Diagnosis not present

## 2016-01-11 ENCOUNTER — Other Ambulatory Visit: Payer: Self-pay

## 2016-01-28 ENCOUNTER — Ambulatory Visit: Payer: Self-pay | Admitting: Gastroenterology

## 2016-02-16 ENCOUNTER — Other Ambulatory Visit: Payer: Self-pay | Admitting: Internal Medicine

## 2016-02-18 MED ORDER — ZOLPIDEM TARTRATE 5 MG PO TABS: 5.0000 mg | ORAL_TABLET | Freq: Every evening | ORAL | Status: DC | PRN

## 2016-02-18 NOTE — Telephone Encounter (Signed)
Yes thanks, but age and gender restricted to '5mg'$ - please reduce dose to '5mg'$ 

## 2016-02-29 ENCOUNTER — Ambulatory Visit: Payer: Self-pay | Admitting: Gastroenterology

## 2016-02-29 ENCOUNTER — Telehealth: Payer: Self-pay | Admitting: Gastroenterology

## 2016-02-29 NOTE — Telephone Encounter (Signed)
Would you like to charge the no show fee?

## 2016-03-01 NOTE — Telephone Encounter (Signed)
No, thanks

## 2016-03-10 ENCOUNTER — Other Ambulatory Visit: Payer: Self-pay | Admitting: Internal Medicine

## 2016-03-10 NOTE — Telephone Encounter (Signed)
Rx refill sent to pharmacy. 

## 2016-03-21 ENCOUNTER — Encounter: Payer: Self-pay | Admitting: Pulmonary Disease

## 2016-03-21 ENCOUNTER — Ambulatory Visit (INDEPENDENT_AMBULATORY_CARE_PROVIDER_SITE_OTHER): Payer: Medicare Other | Admitting: Pulmonary Disease

## 2016-03-21 DIAGNOSIS — A310 Pulmonary mycobacterial infection: Secondary | ICD-10-CM | POA: Diagnosis not present

## 2016-03-21 NOTE — Progress Notes (Signed)
History of Present Illness Sandra Craig is a 75 y.o. female with MAC , who has completed treatment with Clarithromycin and Rifampin followed by Dr. Lake Bells.   03/21/2016 Follow up OV for MAC: Pt. Presents to the office for follow up of MAC. She completed her treatment with Clarithromycin and Rifampin afew weeks ago ( August 2017) She is doing well. She does have a residual nagging cough which she suspects is reflux.She takes Zantac for this as needed. She was re scanned in June 2017. She is no longer taking her Flonase. She is otherwise doing well. She does want to know if we will be following her pulmonary nodules. She denies chest pain, fever, orthopnea or hemoptysis. She states that if she has any secretions with cough, they are clear. Weight has been stable.  Tests 01/08/2016  CT Chest  IMPRESSION: 1. Minimal residual wall tree-in-bud pattern in the right upper lobe likely chronic inflammation. 2. No worrisome pulmonary lesions or acute pulmonary findings. 3. Scattered mediastinal and hilar lymph nodes but no mass or overt adenopathy  Past medical hx Past Medical History:  Diagnosis Date  . 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 Chronic insomnia   . Cystitis   . Diverticulitis of intestine without perforation or abscess without bleeding    Patient did have abscess but noperforation  . Diverticulosis of colon (without mention of hemorrhage)   . Endometriosis   . Family history of malignant neoplasm of gastrointestinal tract   . Fibromyalgia   . Gastritis   . Hiatal hernia   . HIATAL HERNIA 10/08/2008   Qualifier: Diagnosis of  By: Nils Pyle CMA ([place]), Mearl Latin    . History of gallstones   . Hyperlipidemia   . Hypertension   . IBS (irritable bowel syndrome)   . IC (interstitial cystitis)   . Internal hemorrhoid   . Osteonecrosis ([place])   . Osteopenia   . Osteoporosis   . Palpitations   . Polycystic kidney disease   . Pulmonary nodule 12/08   5 mm  Anterior RUL  . Small bowel obstruction ([facility])      Past surgical hx, Family hx, Social hx all reviewed.  Current Outpatient Prescriptions on File Prior to Visit  Medication Sig  . Calcium Carbonate-Vit D-Min (CALCIUM 1200 PO) Take 2 capsules by mouth daily.   . fluticasone (FLONASE) 50 MCG/ACT nasal spray SHAKE WELL AND USE 2 SPRAYS IN EACH NOSTRIL DAILY  . gabapentin (NEURONTIN) 300 MG capsule Take 1 capsule (300 mg total) by mouth at bedtime.  Marland Kitchen losartan (COZAAR) 50 MG tablet Take 1 tablet (50 mg total) by mouth daily.  . ranitidine (ZANTAC) 150 MG capsule Take 150 mg by mouth daily.  . rosuvastatin (CRESTOR) 20 MG tablet TAKE 1 TABLET(20 MG) BY MOUTH DAILY  . sertraline (ZOLOFT) 100 MG tablet Take 1.5 tablets (150 mg total) by mouth daily.  Marland Kitchen zolpidem (AMBIEN) 5 MG tablet Take 1 tablet (5 mg total) by mouth at bedtime as needed for sleep.   No current facility-administered medications on file prior to visit.      Allergies  Allergen Reactions  . Azithromycin Rash    Pruritic rash diffuse   . Celecoxib   . Prednisone Other (See Comments)    Made pt feel "crazy"  . Sulfonamide Derivatives     Review Of Systems:  Constitutional:   No  weight loss, night sweats,  Fevers, chills, fatigue, or  lassitude.  HEENT:   No headaches,  Difficulty  swallowing,  Tooth/dental problems, or  Sore throat,                No sneezing, itching, ear ache, nasal congestion, post nasal drip,   CV:  No chest pain,  Orthopnea, PND, swelling in lower extremities, anasarca, dizziness, palpitations, syncope.   GI  No heartburn, +indigestion, abdominal pain, nausea, vomiting, diarrhea, change in bowel habits, loss of appetite, bloody stools.   Resp: No shortness of breath with exertion or at rest.  No excess mucus, no productive cough,  No non-productive cough,  No coughing up of blood.  No change in color of mucus.  No wheezing.  No chest wall deformity  Skin: no rash or lesions.  GU: no dysuria,  change in color of urine, no urgency or frequency.  No flank pain, no hematuria   MS:  No joint pain or swelling.  No decreased range of motion.  No back pain.  Psych:  No change in mood or affect. No depression or anxiety.  No memory loss.   Vital Signs BP 126/68 (BP Location: Left Arm, Cuff Size: Normal)   Pulse 71   Ht '5\' 6"'$  (1.676 m)   Wt 130 lb (59 kg)   LMP 08/25/1992   SpO2 96%   BMI 20.98 kg/m    Physical Exam:  General- No distress,  A&Ox3, thin female ENT: No sinus tenderness, TM clear, pale nasal mucosa, no oral exudate,no post nasal drip, no LAN Cardiac: S1, S2, regular rate and rhythm, no murmur Chest: No wheeze/ rales/ dullness; no accessory muscle use, no nasal flaring, no sternal retractions Abd.: Soft Non-tender Ext: No clubbing cyanosis, edema Neuro:  normal strength Skin: No rashes, warm and dry Psych: normal mood and behavior   Assessment/Plan  MAI (mycobacterium avium-intracellulare) infection ([facility])   MAC with completion of treatment  03/2016 with  Clarithromycin and Rifampin  We are glad you are doing well. Try taking your zantac before bed to see if it helps with your GERD. Follow up with Dr. Lake Bells in 6 months. Please contact office for sooner follow up if symptoms do not improve or worsen or seek emergency care                                                                                                                                                                                                                   Magdalen Spatz, NP 03/21/2016  2:12 PM

## 2016-03-21 NOTE — Patient Instructions (Signed)
It is good to see you today. We are glad you are doing well. Try taking your zantac before bed to see if it helps with your GERD. Follow up with Dr. Lake Bells in 6 months. Please contact office for sooner follow up if symptoms do not improve or worsen or seek emergency care

## 2016-03-21 NOTE — Assessment & Plan Note (Signed)
   MAC with completion of treatment  03/2016 with  Clarithromycin and Rifampin  We are glad you are doing well. Try taking your zantac before bed to see if it helps with your GERD. Follow up with Dr. Lake Bells in 6 months. Please contact office for sooner follow up if symptoms do not improve or worsen or seek emergency care

## 2016-03-21 NOTE — Progress Notes (Signed)
Attending:  I have seen and examined the patient today with Judson Roch gross in a group the findings from her note.  She's been doing well recently, minimal cough in the evenings, no mucus production, she notes an 8 pound weight loss in the last year but no night sweats or fevers or chills  Lungs clear to auscultation bilaterally, normal effort  MAI: She has been treated for 12 months, we will hold further treatment. Results of her CT scan were reviewed again today in clinic. No findings worrisome for malignancy. She does not meet criteria for low-dose lung cancer screening. I advised today that MAI is a chronic infection and signs of recurrence would include mucus production for several weeks, weight loss, fevers, chills. We'll see her again in 6 months but if she has any of these symptoms I want to see her sooner than that.  Roselie Awkward, MD [facility] Pager: [phone] Cell: [phone] After 3pm or if no response, call [phone]

## 2016-04-18 ENCOUNTER — Encounter: Payer: Self-pay | Admitting: Family Medicine

## 2016-04-18 ENCOUNTER — Ambulatory Visit (INDEPENDENT_AMBULATORY_CARE_PROVIDER_SITE_OTHER): Payer: Medicare Other | Admitting: Family Medicine

## 2016-04-18 DIAGNOSIS — K222 Esophageal obstruction: Secondary | ICD-10-CM | POA: Diagnosis not present

## 2016-04-18 DIAGNOSIS — G4700 Insomnia, unspecified: Secondary | ICD-10-CM | POA: Diagnosis not present

## 2016-04-18 DIAGNOSIS — K219 Gastro-esophageal reflux disease without esophagitis: Secondary | ICD-10-CM

## 2016-04-18 MED ORDER — ZOLPIDEM TARTRATE 5 MG PO TABS: 5.0000 mg | ORAL_TABLET | Freq: Every evening | ORAL | 0 refills | Status: DC | PRN

## 2016-04-18 MED ORDER — TRAZODONE HCL 50 MG PO TABS: 25.0000 mg | ORAL_TABLET | Freq: Every evening | ORAL | 3 refills | Status: DC | PRN

## 2016-04-18 NOTE — Assessment & Plan Note (Signed)
S: ambien '10mg'$  long term and could fall asleep but not stay asleep. Has not tried trazodone or doxepin. On 5 mg ambien does not even fall asleep A/P: will try combo of trazodone '50mg'$  and ambien '5mg'$ . If that fails consider doxepin. If that fails, go back to '10mg'$  ambien likely.

## 2016-04-18 NOTE — Assessment & Plan Note (Signed)
S: epigastric abdominal pain during but also after meals then slowly resolves- seems to be worse after big meals or at night. Was going to see Dr. Loletha Carrow of GI but cancelled because comes and go. Ranitidine taking just once a day. Used to be on nexium and never had similar issues. No exertional epigastric pain or SOB with this A/P: Poor control of GERD- change zantac to BID- apparently she had fracture on PPI and is concerned about risks. We discussed she may have to go back to that treatment if she does not improve. 3 week zantac trial BID. 3 week PPI treatment after that. Return to GI if symptoms persist at that point- may need EGD with history of stricture but no dysphagia at present

## 2016-04-18 NOTE — Progress Notes (Signed)
Pre visit review using our clinic review tool, if applicable. No additional management support is needed unless otherwise documented below in the visit note. 

## 2016-04-18 NOTE — Patient Instructions (Signed)
Ranitidine- change to twice a day for 3 weeks. If still having symptoms, change to OTC nexium or prilosec for another 3 weeks.   If worsening symptoms or any exertional symptoms see me immediatley  Trial trazodone along with ambien for 2-3 weeks. Doxepin would be next step to trial

## 2016-04-18 NOTE — Progress Notes (Signed)
Subjective:  Sandra Craig is a 75 y.o. year old very pleasant female patient who presents for/with See problem oriented charting ROS- No chest pain or shortness of breath. No headache or blurry vision.see any ROS included in HPI as well.   Past Medical History-  Patient Active Problem List   Diagnosis Date Noted  . 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 Chronic low back pain 12/20/2013    Priority: High  . Fibromyalgia 12/05/2007    Priority: High  . History of small bowel obstruction 06/28/2011    Priority: Medium  . GERD with stricture 06/28/2011    Priority: Medium  . Polycystic kidney disease     Priority: Medium  . Hypertension     Priority: Medium  . Hyperlipidemia     Priority: Medium  . Osteoporosis 12/21/2009    Priority: Medium  . INSOMNIA, CHRONIC 10/08/2008    Priority: Medium  . Depression 02/08/2008    Priority: Medium  . Chronic right shoulder pain 03/07/2014    Priority: Low  . Benign essential tremor 10/09/2013    Priority: Low  . Hyperglycemia 06/27/2012    Priority: Low  . Diverticulitis large intestine 02/11/2012    Priority: Low  . Irritable bowel syndrome (IBS) 06/28/2011    Priority: Low  . IC (interstitial cystitis)     Priority: Low  . Hemorrhoids 10/08/2008    Priority: Low  . 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 HEMATURIA UNSPECIFIED 07/04/2008    Priority: Low  . Lung nodules 08/09/2007    Priority: Low    Medications- reviewed and updated Current Outpatient Prescriptions  Medication Sig Dispense Refill  . Calcium Carbonate-Vit D-Min (CALCIUM 1200 PO) Take 2 capsules by mouth daily.     Marland Kitchen gabapentin (NEURONTIN) 300 MG capsule Take 1 capsule (300 mg total) by mouth at bedtime. 90 capsule 3  . losartan (COZAAR) 50 MG tablet Take 1 tablet (50 mg total) by mouth daily. 90 tablet 1  . ranitidine (ZANTAC) 150 MG capsule Take 150 mg by mouth  daily.    . rosuvastatin (CRESTOR) 20 MG tablet TAKE 1 TABLET(20 MG) BY MOUTH DAILY 90 tablet 0  . sertraline (ZOLOFT) 100 MG tablet Take 1.5 tablets (150 mg total) by mouth daily. 135 tablet 1  . zolpidem (AMBIEN) 5 MG tablet Take 1 tablet (5 mg total) by mouth at bedtime as needed for sleep. 90 tablet 0  . traZODone (DESYREL) 50 MG tablet Take 0.5-1 tablets (25-50 mg total) by mouth at bedtime as needed for sleep. 30 tablet 3     Objective: BP 132/82 (BP Location: Left Arm, Patient Position: Sitting, Cuff Size: Normal)   Pulse 69   Temp 98 F (36.7 C) (Oral)   Wt 128 lb 3.2 oz (58.2 kg)   LMP 08/25/1992   SpO2 96%   BMI 20.69 kg/m  Gen: NAD, resting comfortably CV: RRR no murmurs rubs or gallops Lungs: CTAB no crackles, wheeze, rhonchi Abdomen: soft/nontender except for mild in LLQ wher eprior bowel surgery was/nondistended/normal bowel sounds. No rebound or guarding.  Ext: no edema Skin: warm, dry Neuro: grossly normal, moves all extremities  Assessment/Plan:  Will note does have history of small intestine blockage requiring resection but she is passing stool and gas at present. No red flags  GERD with stricture S: epigastric abdominal pain during but also after meals then slowly resolves- seems to be worse after  big meals or at night. Was going to see Dr. Loletha Carrow of GI but cancelled because comes and go. Ranitidine taking just once a day. Used to be on nexium and never had similar issues. No exertional epigastric pain or SOB with this A/P: Poor control of GERD- change zantac to BID- apparently she had fracture on PPI and is concerned about risks. We discussed she may have to go back to that treatment if she does not improve. 3 week zantac trial BID. 3 week PPI treatment after that. Return to GI if symptoms persist at that point- may need EGD with history of stricture but no dysphagia at present  INSOMNIA, CHRONIC S: ambien '10mg'$  long term and could fall asleep but not stay asleep.  Has not tried trazodone or doxepin. On 5 mg ambien does not even fall asleep A/P: will try combo of trazodone '50mg'$  and ambien '5mg'$ . If that fails consider doxepin. If that fails, go back to '10mg'$  ambien likely.   Asked for update at 3 and 6 weeks with sooner Return precautions advised.   Meds ordered this encounter  Medications  . traZODone (DESYREL) 50 MG tablet    Sig: Take 0.5-1 tablets (25-50 mg total) by mouth at bedtime as needed for sleep.    Dispense:  30 tablet    Refill:  3  . zolpidem (AMBIEN) 5 MG tablet    Sig: Take 1 tablet (5 mg total) by mouth at bedtime as needed for sleep.    Dispense:  90 tablet    Refill:  0   Garret Reddish, MD

## 2016-04-20 NOTE — Telephone Encounter (Signed)
Sandra Craig can you call pharmacy and tell them ok to fill early- she was taking her prior higher dose at times- we are going to try trazodone and ambien together in hopes to avoid the '10mg'$  dose of [name]

## 2016-04-21 ENCOUNTER — Ambulatory Visit (INDEPENDENT_AMBULATORY_CARE_PROVIDER_SITE_OTHER): Payer: Medicare Other

## 2016-04-21 DIAGNOSIS — Z23 Encounter for immunization: Secondary | ICD-10-CM

## 2016-04-26 ENCOUNTER — Other Ambulatory Visit: Payer: Self-pay | Admitting: Family Medicine

## 2016-04-27 ENCOUNTER — Other Ambulatory Visit: Payer: Self-pay | Admitting: Internal Medicine

## 2016-04-27 DIAGNOSIS — M545 Low back pain: Secondary | ICD-10-CM | POA: Diagnosis not present

## 2016-04-27 DIAGNOSIS — M4806 Spinal stenosis, lumbar region: Secondary | ICD-10-CM | POA: Diagnosis not present

## 2016-04-27 DIAGNOSIS — M25552 Pain in left hip: Secondary | ICD-10-CM | POA: Diagnosis not present

## 2016-04-29 ENCOUNTER — Telehealth: Payer: Self-pay | Admitting: Family Medicine

## 2016-04-29 NOTE — Telephone Encounter (Signed)
Pt would like jaime to return her call concerning zolipidem . Pt is going out of town today

## 2016-04-29 NOTE — Telephone Encounter (Signed)
Yes thanks, '10mg'$  #10 ambien with no refills then resume '5mg'$  dosage

## 2016-05-02 ENCOUNTER — Other Ambulatory Visit: Payer: Self-pay

## 2016-05-02 MED ORDER — ZOLPIDEM TARTRATE 10 MG PO TABS: 10.0000 mg | ORAL_TABLET | Freq: Every evening | ORAL | 0 refills | Status: DC | PRN

## 2016-05-02 NOTE — Telephone Encounter (Signed)
Spoke with patient and made her aware that prescription was faxed over this afternoon. Verbalized understanding.

## 2016-05-02 NOTE — Telephone Encounter (Signed)
Tried to call patient to notify her. Cell number is giving a busy signal. Will try again here shortly

## 2016-05-02 NOTE — Telephone Encounter (Signed)
Prescription sent to pharmacy.

## 2016-05-03 ENCOUNTER — Encounter: Payer: Self-pay | Admitting: Family Medicine

## 2016-05-03 ENCOUNTER — Ambulatory Visit (INDEPENDENT_AMBULATORY_CARE_PROVIDER_SITE_OTHER): Payer: Medicare Other | Admitting: Family Medicine

## 2016-05-03 VITALS — BP 138/78 | HR 63 | Temp 98.4°F | Wt 127.4 lb

## 2016-05-03 DIAGNOSIS — G4700 Insomnia, unspecified: Secondary | ICD-10-CM

## 2016-05-03 MED ORDER — MIRTAZAPINE 7.5 MG PO TABS: 7.5000 mg | ORAL_TABLET | Freq: Every day | ORAL | 3 refills | Status: DC

## 2016-05-03 MED ORDER — SERTRALINE HCL 100 MG PO TABS
100.0000 mg | ORAL_TABLET | Freq: Every day | ORAL | 3 refills | Status: DC
Start: 2016-05-03 — End: 2016-11-18

## 2016-05-03 NOTE — Patient Instructions (Addendum)
Stop trazodone  Start remeron 7.'5mg'$  nightly. Can use '5mg'$  of ambien along with this. Update me in 2 weeks by mychart.   Continue antidepressant and gabapentin at bedtime  Glad the stomach is better- continue zantac twice a day

## 2016-05-03 NOTE — Progress Notes (Signed)
Pre visit review using our clinic review tool, if applicable. No additional management support is needed unless otherwise documented below in the visit note. 

## 2016-05-03 NOTE — Progress Notes (Signed)
Subjective:  Sandra Craig is a 75 y.o. year old very pleasant female patient who presents for/with See problem oriented charting ROS- no sleepwalking, no trouble driving in AM. Did have some staggering and lingering feelings of fatigue in AM after trazodone.see any ROS included in HPI as well.   Past Medical History-  Patient Active Problem List   Diagnosis Date Noted  . 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 Chronic low back pain 12/20/2013    Priority: High  . Fibromyalgia 12/05/2007    Priority: High  . History of small bowel obstruction 06/28/2011    Priority: Medium  . GERD with stricture 06/28/2011    Priority: Medium  . Polycystic kidney disease     Priority: Medium  . Hypertension     Priority: Medium  . Hyperlipidemia     Priority: Medium  . Osteoporosis 12/21/2009    Priority: Medium  . INSOMNIA, CHRONIC 10/08/2008    Priority: Medium  . Depression 02/08/2008    Priority: Medium  . Chronic right shoulder pain 03/07/2014    Priority: Low  . Benign essential tremor 10/09/2013    Priority: Low  . Hyperglycemia 06/27/2012    Priority: Low  . Diverticulitis large intestine 02/11/2012    Priority: Low  . Irritable bowel syndrome (IBS) 06/28/2011    Priority: Low  . IC (interstitial cystitis)     Priority: Low  . Hemorrhoids 10/08/2008    Priority: Low  . 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 HEMATURIA UNSPECIFIED 07/04/2008    Priority: Low  . Lung nodules 08/09/2007    Priority: Low    Medications- reviewed and updated Current Outpatient Prescriptions  Medication Sig Dispense Refill  . Calcium Carbonate-Vit D-Min (CALCIUM 1200 PO) Take 2 capsules by mouth daily.     Marland Kitchen gabapentin (NEURONTIN) 300 MG capsule Take 1 capsule (300 mg total) by mouth at bedtime. 90 capsule 3  . losartan (COZAAR) 50 MG tablet Take 1 tablet (50 mg total) by mouth daily. 90 tablet 1  .  ranitidine (ZANTAC) 150 MG capsule Take 150 mg by mouth daily.    . rosuvastatin (CRESTOR) 20 MG tablet TAKE 1 TABLET(20 MG) BY MOUTH DAILY 90 tablet 0  . sertraline (ZOLOFT) 100 MG tablet Take 1 tablet (100 mg total) by mouth daily. 90 tablet 3  . zolpidem (AMBIEN) 10 MG tablet Take 1 tablet (10 mg total) by mouth at bedtime as needed for sleep. 10 tablet 0  . mirtazapine (REMERON) 7.5 MG tablet Take 1 tablet (7.5 mg total) by mouth at bedtime. 30 tablet 3   No current facility-administered medications for this visit.     Objective: BP 138/78 (BP Location: Left Arm, Patient Position: Sitting, Cuff Size: Normal)   Pulse 63   Temp 98.4 F (36.9 C) (Oral)   Wt 127 lb 6.4 oz (57.8 kg)   LMP 08/25/1992   SpO2 98%   BMI 20.56 kg/m  Gen: NAD, resting comfortably  Assessment/Plan:  INSOMNIA, CHRONIC S: Trazodone makes patient feel drunk even at '25mg'$ . She states she staggers and as a recovering alcoholic the similarity is intolerable to her. She was able to sleep through the night with it even without ambien. Just got '10mg'$  ambien last night and paid out of pocket due to prior auth being needed that hadn't been reported to [place] yet. Took only 1/2 pill as instructed and did not sleep well. Of  note patient also states since she had been on antibiotics for MAC her appetite was lower and she has lost weight.   Cancer screening- Colon 2013 normal with polyp but not adenoma. Mammogram in may. Former smoker quit 1978. CT scans no lung cancer within a year for MAC follow up A/P: Initially plan was doxepin 3 mg dose alone or with ambien but patient's weight loss caused me to shift to remeron 7.'5mg'$  which she can use along with the '5mg'$  Lorrin Mais which has not been helpful enough.  also changed zoloft to '100mg'$  as has not been taking '150mg'$  Meds ordered this encounter  Medications  . mirtazapine (REMERON) 7.5 MG tablet    Sig: Take 1 tablet (7.5 mg total) by mouth at bedtime.    Dispense:  30 tablet    Refill:   3  . sertraline (ZOLOFT) 100 MG tablet    Sig: Take 1 tablet (100 mg total) by mouth daily.    Dispense:  90 tablet    Refill:  3    Return precautions advised.  Garret Reddish, MD

## 2016-05-03 NOTE — Assessment & Plan Note (Signed)
S: Trazodone makes patient feel drunk even at '25mg'$ . She states she staggers and as a recovering alcoholic the similarity is intolerable to her. She was able to sleep through the night with it even without ambien. Just got '10mg'$  ambien last night and paid out of pocket due to prior auth being needed that hadn't been reported to [place] yet. Took only 1/2 pill as instructed and did not sleep well. Of note patient also states since she had been on antibiotics for MAC her appetite was lower and she has lost weight.   Cancer screening- Colon 2013 normal with polyp but not adenoma. Mammogram in may. Former smoker quit 1978. CT scans no lung cancer within a year for MAC follow up A/P: Initially plan was doxepin 3 mg dose alone or with ambien but patient's weight loss caused me to shift to remeron 7.'5mg'$  which she can use along with the '5mg'$  Lorrin Mais which has not been helpful enough.

## 2016-05-04 ENCOUNTER — Ambulatory Visit: Payer: Medicare Other | Admitting: Podiatry

## 2016-05-18 ENCOUNTER — Telehealth: Payer: Self-pay | Admitting: Family Medicine

## 2016-05-18 NOTE — Telephone Encounter (Signed)
Pt would like you to call in the  zolpidem (AMBIEN) 5 MG tablet Dr hunter is going to prescribe pt to  [facility]  (not [facility])

## 2016-05-19 NOTE — Telephone Encounter (Signed)
Pt following up on refill request. Pt is out of this med.  Pt hopes to get asap.

## 2016-05-19 NOTE — Telephone Encounter (Signed)
Prescription faxed to [facility] as requested

## 2016-05-27 ENCOUNTER — Encounter: Payer: Self-pay | Admitting: Family Medicine

## 2016-06-06 ENCOUNTER — Encounter: Payer: Self-pay | Admitting: Family Medicine

## 2016-06-06 ENCOUNTER — Ambulatory Visit (INDEPENDENT_AMBULATORY_CARE_PROVIDER_SITE_OTHER): Payer: Medicare Other | Admitting: Family Medicine

## 2016-06-06 DIAGNOSIS — G4700 Insomnia, unspecified: Secondary | ICD-10-CM

## 2016-06-06 MED ORDER — DOXEPIN HCL 3 MG PO TABS: 3.0000 mg | ORAL_TABLET | Freq: Every day | ORAL | 5 refills | Status: DC

## 2016-06-06 MED ORDER — ZOLPIDEM TARTRATE 5 MG PO TABS: 5.0000 mg | ORAL_TABLET | Freq: Every evening | ORAL | 5 refills | Status: DC | PRN

## 2016-06-06 NOTE — Assessment & Plan Note (Addendum)
S: slept on trazodone but felt drunk so taken off. We were considering doxepin but poor appetite and low weight led [place] to addition of remeron 7.'5mg'$  to ambien '5mg'$ . This worked reasonably well but one night did not sleep at all and had to take another remeron. Another night slept until noon which today she states is not actually isssue. Bigger issue has been weight gain which is now over 10 lbs- states appetite much higher on regimen. She has tried up to '10mg'$  ambien along with the remeron on a night that it was harder to fall asleep. Instructed patient not to go outside of bounds of what I am prescribing. She has failed ambien '5mg'$  alone.   A/P:insomnia- poor control Difficult for patient to understand why we are attempting to get her off '10mg'$  ambien but in the end understanding when discussed age and gender restrictions. Stop remeron with weight gain. Start doxepin '3mg'$  with ambien '5mg'$  with follow up in 1 week. Wonder if we can get patient to 2.'5mg'$  ambien or less if able to get doxepin to '6mg'$ . Could also trial belsomra but doubt efficacious. Extended counseling required today

## 2016-06-06 NOTE — Progress Notes (Signed)
Pre visit review using our clinic review tool, if applicable. No additional management support is needed unless otherwise documented below in the visit note. 

## 2016-06-06 NOTE — Progress Notes (Signed)
Subjective:  Sandra Craig is a 75 y.o. year old very pleasant female patient who presents for/with See problem oriented charting ROS- insomnia. Denies depression or anxiety other than at night when her mind is racing and wont allow her to go to sleep. No chest pain.see any ROS included in HPI as well.   Past Medical History-  Patient Active Problem List   Diagnosis Date Noted  . 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 Chronic low back pain 12/20/2013    Priority: High  . Fibromyalgia 12/05/2007    Priority: High  . History of small bowel obstruction 06/28/2011    Priority: Medium  . GERD with stricture 06/28/2011    Priority: Medium  . Polycystic kidney disease     Priority: Medium  . Hypertension     Priority: Medium  . Hyperlipidemia     Priority: Medium  . Osteoporosis 12/21/2009    Priority: Medium  . INSOMNIA, CHRONIC 10/08/2008    Priority: Medium  . Depression 02/08/2008    Priority: Medium  . Chronic right shoulder pain 03/07/2014    Priority: Low  . Benign essential tremor 10/09/2013    Priority: Low  . Hyperglycemia 06/27/2012    Priority: Low  . Diverticulitis large intestine 02/11/2012    Priority: Low  . Irritable bowel syndrome (IBS) 06/28/2011    Priority: Low  . IC (interstitial cystitis)     Priority: Low  . Hemorrhoids 10/08/2008    Priority: Low  . 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 HEMATURIA UNSPECIFIED 07/04/2008    Priority: Low  . Lung nodules 08/09/2007    Priority: Low    Medications- reviewed and updated Current Outpatient Prescriptions  Medication Sig Dispense Refill  . Calcium Carbonate-Vit D-Min (CALCIUM 1200 PO) Take 2 capsules by mouth daily.     Marland Kitchen gabapentin (NEURONTIN) 300 MG capsule Take 1 capsule (300 mg total) by mouth at bedtime. 90 capsule 3  . losartan (COZAAR) 50 MG tablet Take 1 tablet (50 mg total) by mouth daily. 90 tablet 1   . ranitidine (ZANTAC) 150 MG capsule Take 150 mg by mouth daily.    . rosuvastatin (CRESTOR) 20 MG tablet TAKE 1 TABLET(20 MG) BY MOUTH DAILY 90 tablet 0  . sertraline (ZOLOFT) 100 MG tablet Take 1 tablet (100 mg total) by mouth daily. 90 tablet 3  . Doxepin HCl 3 MG TABS Take 1 tablet (3 mg total) by mouth at bedtime. 30 tablet 5  . zolpidem (AMBIEN) 5 MG tablet Take 1 tablet (5 mg total) by mouth at bedtime as needed for sleep. 30 tablet 5   No current facility-administered medications for this visit.     Objective: BP 134/84 (BP Location: Left Arm, Patient Position: Sitting, Cuff Size: Normal)   Pulse 72   Temp 98.3 F (36.8 C) (Oral)   Wt 141 lb 3.2 oz (64 kg)   LMP 08/25/1992   SpO2 95%   BMI 22.79 kg/m  Gen: NAD, resting comfortably but does appear fatigued  Assessment/Plan:  INSOMNIA, CHRONIC S: slept on trazodone but felt drunk so taken off. We were considering doxepin but poor appetite and low weight led [place] to addition of remeron 7.'5mg'$  to ambien '5mg'$ . This worked reasonably well but one night did not sleep at all and had to take another remeron. Another night slept until noon which today she states is not actually isssue. Bigger issue has  been weight gain which is now over 10 lbs- states appetite much higher on regimen. She has tried up to '10mg'$  ambien along with the remeron on a night that it was harder to fall asleep. Instructed patient not to go outside of bounds of what I am prescribing. She has failed ambien '5mg'$  alone.   A/P:insomnia- poor control Difficult for patient to understand why we are attempting to get her off '10mg'$  ambien but in the end understanding when discussed age and gender restrictions. Stop remeron with weight gain. Start doxepin '3mg'$  with ambien '5mg'$  with follow up in 1 week. Wonder if we can get patient to 2.'5mg'$  ambien or less if able to get doxepin to '6mg'$ . Could also trial belsomra but doubt efficacious. Extended counseling required today   Meds ordered  this encounter  Medications  . zolpidem (AMBIEN) 5 MG tablet    Sig: Take 1 tablet (5 mg total) by mouth at bedtime as needed for sleep.    Dispense:  30 tablet    Refill:  5  . Doxepin HCl 3 MG TABS    Sig: Take 1 tablet (3 mg total) by mouth at bedtime.    Dispense:  30 tablet    Refill:  5   The duration of face-to-face time during this visit was greater than 15 minutes. Greater than 50% of this time was spent in counseling as noted above  Return precautions advised.  Garret Reddish, MD

## 2016-06-06 NOTE — Patient Instructions (Signed)
Stop remeron- sorry about the weight gain.   Start doxepin '3mg'$ , take ambien '5mg'$  along with it.   Follow up with me by mychart in a week- give me a night by night recap  Then we will decide on whether to try '6mg'$  or not

## 2016-06-07 ENCOUNTER — Encounter: Payer: Self-pay | Admitting: Family Medicine

## 2016-06-07 DIAGNOSIS — Z961 Presence of intraocular lens: Secondary | ICD-10-CM | POA: Diagnosis not present

## 2016-06-07 DIAGNOSIS — H2511 Age-related nuclear cataract, right eye: Secondary | ICD-10-CM | POA: Diagnosis not present

## 2016-06-07 DIAGNOSIS — H02839 Dermatochalasis of unspecified eye, unspecified eyelid: Secondary | ICD-10-CM | POA: Diagnosis not present

## 2016-06-07 DIAGNOSIS — H25011 Cortical age-related cataract, right eye: Secondary | ICD-10-CM | POA: Diagnosis not present

## 2016-06-07 DIAGNOSIS — H25041 Posterior subcapsular polar age-related cataract, right eye: Secondary | ICD-10-CM | POA: Diagnosis not present

## 2016-06-07 DIAGNOSIS — H18413 Arcus senilis, bilateral: Secondary | ICD-10-CM | POA: Diagnosis not present

## 2016-06-07 DIAGNOSIS — H26491 Other secondary cataract, right eye: Secondary | ICD-10-CM | POA: Diagnosis not present

## 2016-06-07 NOTE — Telephone Encounter (Signed)
Sandra Craig I am ok with writing #90 of the ambien '5mg'$  and then talking with pharmacy about change  You may refill her losartan  Please let her know about the above.

## 2016-06-08 ENCOUNTER — Telehealth: Payer: Self-pay | Admitting: Family Medicine

## 2016-06-08 ENCOUNTER — Telehealth: Payer: Self-pay

## 2016-06-08 NOTE — Telephone Encounter (Signed)
Juliann Pulse with [employer] would like to have some clinical information for the Rx Zolpidem.  Information was faxed to the office and not sure if it was addressed or not you can call [phone].

## 2016-06-08 NOTE — Telephone Encounter (Signed)
Received form from insurance company. Form filled out and faxed back to insurance.

## 2016-06-09 ENCOUNTER — Other Ambulatory Visit: Payer: Self-pay | Admitting: Family Medicine

## 2016-06-09 NOTE — Telephone Encounter (Signed)
Form answering clinical questions was faxed to [facility] yesterday.

## 2016-06-09 NOTE — Telephone Encounter (Signed)
Sarah completed the paperwork from [facility] that was sent.

## 2016-06-09 NOTE — Telephone Encounter (Signed)
PA is for the Zolpidem tablets.

## 2016-06-13 ENCOUNTER — Other Ambulatory Visit: Payer: Self-pay | Admitting: Family Medicine

## 2016-06-14 NOTE — Telephone Encounter (Signed)
Clinical info was faxed twice, [employer] says they never received it so PA was denied. Appeal paperwork completed & waiting on MD signature.

## 2016-06-14 NOTE — Telephone Encounter (Signed)
Form re-faxed to insurance company & submitted via covermymeds. Key: [id number]

## 2016-06-16 NOTE — Telephone Encounter (Signed)
Noted  

## 2016-06-16 NOTE — Telephone Encounter (Signed)
Sandra Craig with [facility] [employer] called to state the zolpidem (AMBIEN) 5 MG tablet  has been approved. Sandra Craig has already called the pt.

## 2016-06-30 ENCOUNTER — Ambulatory Visit (INDEPENDENT_AMBULATORY_CARE_PROVIDER_SITE_OTHER): Payer: Medicare Other | Admitting: Family Medicine

## 2016-06-30 ENCOUNTER — Encounter: Payer: Self-pay | Admitting: Family Medicine

## 2016-06-30 DIAGNOSIS — G4700 Insomnia, unspecified: Secondary | ICD-10-CM

## 2016-06-30 MED ORDER — ZOLPIDEM TARTRATE 10 MG PO TABS: 10.0000 mg | ORAL_TABLET | Freq: Every evening | ORAL | 1 refills | Status: DC | PRN

## 2016-06-30 NOTE — Patient Instructions (Signed)
We have attempted multiple therapies for insomnia and have not had good results. I have opted to return to the '10mg'$  ambien dosing.   If you ever have falls, or fall risk increases, or having any signs of driving difficulty we will have to begin on this road again- perhaps trying belsomra or lunesta next

## 2016-06-30 NOTE — Progress Notes (Signed)
Subjective:  Sandra Craig is a 75 y.o. year old very pleasant female patient who presents for/with See problem oriented charting ROS- No SI. Somewhat more depressed mood since dealing with med changes and dealing with poor sleep. No chest pain or shortness of breath.    Past Medical History-  Patient Active Problem List   Diagnosis Date Noted  . 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 Chronic low back pain 12/20/2013    Priority: High  . Fibromyalgia 12/05/2007    Priority: High  . History of small bowel obstruction 06/28/2011    Priority: Medium  . GERD with stricture 06/28/2011    Priority: Medium  . Polycystic kidney disease     Priority: Medium  . Hypertension     Priority: Medium  . Hyperlipidemia     Priority: Medium  . Osteoporosis 12/21/2009    Priority: Medium  . INSOMNIA, CHRONIC 10/08/2008    Priority: Medium  . Depression 02/08/2008    Priority: Medium  . Chronic right shoulder pain 03/07/2014    Priority: Low  . Benign essential tremor 10/09/2013    Priority: Low  . Hyperglycemia 06/27/2012    Priority: Low  . Diverticulitis large intestine 02/11/2012    Priority: Low  . Irritable bowel syndrome (IBS) 06/28/2011    Priority: Low  . IC (interstitial cystitis)     Priority: Low  . Hemorrhoids 10/08/2008    Priority: Low  . 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 HEMATURIA UNSPECIFIED 07/04/2008    Priority: Low  . Lung nodules 08/09/2007    Priority: Low    Medications- reviewed and updated Current Outpatient Prescriptions  Medication Sig Dispense Refill  . Calcium Carbonate-Vit D-Min (CALCIUM 1200 PO) Take 2 capsules by mouth daily.     Marland Kitchen gabapentin (NEURONTIN) 300 MG capsule Take 1 capsule (300 mg total) by mouth at bedtime. 90 capsule 3  . losartan (COZAAR) 50 MG tablet TAKE 1 TABLET(50 MG) BY MOUTH DAILY 90 tablet 1  . ranitidine (ZANTAC) 150 MG capsule  Take 150 mg by mouth daily.    . rosuvastatin (CRESTOR) 20 MG tablet TAKE 1 TABLET BY MOUTH DAILY 90 tablet 1  . sertraline (ZOLOFT) 100 MG tablet Take 1 tablet (100 mg total) by mouth daily. 90 tablet 3  . zolpidem (AMBIEN) 10 MG tablet Take 1 tablet (10 mg total) by mouth at bedtime as needed for sleep. 90 tablet 1   No current facility-administered medications for this visit.     Objective: BP (!) 134/92 (BP Location: Left Arm, Patient Position: Sitting, Cuff Size: Normal)   Pulse 80   Temp 97.9 F (36.6 C) (Oral)   Ht '5\' 6"'$  (1.676 m)   Wt 137 lb 12.8 oz (62.5 kg)   LMP 08/25/1992   SpO2 95%   BMI 22.24 kg/m  Gen: NAD, resting comfortably  Assessment/Plan:  INSOMNIA, CHRONIC S: We have tried multiple therapies to attempt to get patient to age and sex limits on ambien of '10mg'$ . Over last few months tried: Trazodone- feels drunk Doxepin- has been toldmean and anxious, wants to kick dog. Helps her fall asleep but doesn't stay asleep Benadryl- makes her hyper remeron weight gain 14 lbs in 1 month  It has also been exhausting for patient to make so many switches.  A/P: Prolonged discussion today counseling about benefits/risks of meds tried so far and also consulted by phone prior PCP  Dr. Shawna [place] with patient permission. We ultimately decided to go back to '10mg'$  ambien. 2 other options in long run- belsomra or lunesta- we discussed this if any issues with confusion, driving, falls. She is in agreement.    Meds ordered this encounter  Medications  . zolpidem (AMBIEN) 10 MG tablet    Sig: Take 1 tablet (10 mg total) by mouth at bedtime as needed for sleep.    Dispense:  90 tablet    Refill:  1   The duration of face-to-face time during this visit was greater than 20 minutes. Greater than 50% of this time was spent in counseling, and coordination of care including discussion with prior PCP  BP Readings from Last 3 Encounters:  06/30/16 (!) 134/92  06/06/16 134/84  05/03/16 138/78   will trend BP- patient states poor sleep last night on the ambien '5mg'$  alone and doxepin- woke up early.  .    Return precautions advised.  Garret Reddish, MD

## 2016-06-30 NOTE — Progress Notes (Signed)
Pre visit review using our clinic review tool, if applicable. No additional management support is needed unless otherwise documented below in the visit note. 

## 2016-06-30 NOTE — Assessment & Plan Note (Signed)
S: We have tried multiple therapies to attempt to get patient to age and sex limits on [name] of '10mg'$ . Over last few months tried: Trazodone- feels drunk Doxepin- has been toldmean and anxious, wants to kick dog. Helps her fall asleep but doesn't stay asleep Benadryl- makes her hyper remeron weight gain 14 lbs in 1 month  It has also been exhausting for patient to make so many switches.  A/P: Prolonged discussion today counseling about benefits/risks of meds tried so far and also consulted by phone prior PCP Dr. Shawna [place] with patient permission. We ultimately decided to go back to '10mg'$  ambien. 2 other options in long run- belsomra or lunesta- we discussed this if any issues with confusion, driving, falls. She is in agreement.

## 2016-07-27 DIAGNOSIS — J029 Acute pharyngitis, unspecified: Secondary | ICD-10-CM | POA: Diagnosis not present

## 2016-07-28 ENCOUNTER — Other Ambulatory Visit: Payer: Self-pay | Admitting: Internal Medicine

## 2016-09-19 DIAGNOSIS — L814 Other melanin hyperpigmentation: Secondary | ICD-10-CM | POA: Diagnosis not present

## 2016-09-19 DIAGNOSIS — L821 Other seborrheic keratosis: Secondary | ICD-10-CM | POA: Diagnosis not present

## 2016-09-19 DIAGNOSIS — D2272 Melanocytic nevi of left lower limb, including hip: Secondary | ICD-10-CM | POA: Diagnosis not present

## 2016-09-19 DIAGNOSIS — D1801 Hemangioma of skin and subcutaneous tissue: Secondary | ICD-10-CM | POA: Diagnosis not present

## 2016-09-19 DIAGNOSIS — Z85828 Personal history of other malignant neoplasm of skin: Secondary | ICD-10-CM | POA: Diagnosis not present

## 2016-10-05 DIAGNOSIS — D631 Anemia in chronic kidney disease: Secondary | ICD-10-CM | POA: Diagnosis not present

## 2016-10-05 DIAGNOSIS — E785 Hyperlipidemia, unspecified: Secondary | ICD-10-CM | POA: Diagnosis not present

## 2016-10-05 DIAGNOSIS — Q613 Polycystic kidney, unspecified: Secondary | ICD-10-CM | POA: Diagnosis not present

## 2016-10-05 DIAGNOSIS — N183 Chronic kidney disease, stage 3 (moderate): Secondary | ICD-10-CM | POA: Diagnosis not present

## 2016-10-05 DIAGNOSIS — N2581 Secondary hyperparathyroidism of renal origin: Secondary | ICD-10-CM | POA: Diagnosis not present

## 2016-10-05 LAB — HEPATIC FUNCTION PANEL
ALT: 13 U/L (ref 7–35)
AST: 19 U/L (ref 13–35)
Alkaline Phosphatase: 74 U/L (ref 25–125)
Bilirubin, Total: 0.3 mg/dL

## 2016-10-05 LAB — CBC AND DIFFERENTIAL
HCT: 39 % (ref 36–46)
Hemoglobin: 13.4 g/dL (ref 12.0–16.0)
Neutrophils Absolute: 4 /uL
Platelets: 173 10*3/uL (ref 150–399)
WBC: 6 10^3/mL

## 2016-10-05 LAB — BASIC METABOLIC PANEL
BUN: 22 mg/dL — AB (ref 4–21)
Creatinine: 1.4 mg/dL — AB (ref 0.5–1.1)
Glucose: 87 mg/dL
Potassium: 4.4 mmol/L (ref 3.4–5.3)
Sodium: 138 mmol/L (ref 137–147)

## 2016-11-18 ENCOUNTER — Ambulatory Visit (INDEPENDENT_AMBULATORY_CARE_PROVIDER_SITE_OTHER): Payer: Medicare Other | Admitting: Family Medicine

## 2016-11-18 ENCOUNTER — Encounter: Payer: Self-pay | Admitting: Family Medicine

## 2016-11-18 DIAGNOSIS — A310 Pulmonary mycobacterial infection: Secondary | ICD-10-CM

## 2016-11-18 DIAGNOSIS — G8929 Other chronic pain: Secondary | ICD-10-CM | POA: Diagnosis not present

## 2016-11-18 DIAGNOSIS — M545 Low back pain: Secondary | ICD-10-CM | POA: Diagnosis not present

## 2016-11-18 DIAGNOSIS — F324 Major depressive disorder, single episode, in partial remission: Secondary | ICD-10-CM

## 2016-11-18 MED ORDER — SERTRALINE HCL 100 MG PO TABS: 100.0000 mg | ORAL_TABLET | Freq: Every day | ORAL | 3 refills | Status: DC

## 2016-11-18 NOTE — Assessment & Plan Note (Addendum)
S: Patient stopped her zoloft in last few months for "unknown reason" per her. It seems since that time she has had multiple difficulties. Her depression has worsened though she states only mildly. Her Lorrin Mais that was helping her get to sleep was no longer adequate as back pain increased. She had to add tylenol pm at night as well as gabapentin to help her sleep- she would still wake up at times due to the back pain (se eback pain section).  A/P: I told patient that she should restart her zoloft at this point and follow up in 6 weeks with strict return precautions given. Wonder if coming off zoloft altered her pain threshold with known fibromyalgia

## 2016-11-18 NOTE — Patient Instructions (Signed)
Restart zoloft. Follow up 6 weeks. Contact us immediately if any thoughts of hurting yourself.   Call pulmonary for follow up appointment   Lets see how you are doing at follow up- may need to go back to orthopedics  Sign release of information at the check out desk for records from Dr. Justin Mend

## 2016-11-18 NOTE — Progress Notes (Signed)
Subjective:  Sandra Craig is a 76 y.o. year old very pleasant female patient who presents for/with See problem oriented charting ROS- no leg weakness, no SI. No new incontinence. No known fevers. Some night sweats. Some mild weight loss.    Past Medical History-  Patient Active Problem List   Diagnosis Date Noted  . 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 Chronic low back pain 12/20/2013    Priority: High  . Fibromyalgia 12/05/2007    Priority: High  . History of small bowel obstruction 06/28/2011    Priority: Medium  . GERD with stricture 06/28/2011    Priority: Medium  . Polycystic kidney disease     Priority: Medium  . Hypertension     Priority: Medium  . Hyperlipidemia     Priority: Medium  . Osteoporosis 12/21/2009    Priority: Medium  . INSOMNIA, CHRONIC 10/08/2008    Priority: Medium  . Depression 02/08/2008    Priority: Medium  . Chronic right shoulder pain 03/07/2014    Priority: Low  . Benign essential tremor 10/09/2013    Priority: Low  . Hyperglycemia 06/27/2012    Priority: Low  . Diverticulitis large intestine 02/11/2012    Priority: Low  . Irritable bowel syndrome (IBS) 06/28/2011    Priority: Low  . IC (interstitial cystitis)     Priority: Low  . Hemorrhoids 10/08/2008    Priority: Low  . 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 HEMATURIA UNSPECIFIED 07/04/2008    Priority: Low  . Lung nodules 08/09/2007    Priority: Low    Medications- reviewed and updated Current Outpatient Prescriptions  Medication Sig Dispense Refill  . Calcium Carbonate-Vit D-Min (CALCIUM 1200 PO) Take 2 capsules by mouth daily.     . diphenhydramine-acetaminophen (TYLENOL PM) 25-500 MG TABS tablet Take 1 tablet by mouth at bedtime as needed.    . gabapentin (NEURONTIN) 300 MG capsule Take 1 capsule (300 mg total) by mouth at bedtime. 90 capsule 3  . gabapentin (NEURONTIN) 300 MG  capsule TAKE 1 CAPSULE(300 MG) BY MOUTH TWICE DAILY 180 capsule 1  . losartan (COZAAR) 50 MG tablet TAKE 1 TABLET(50 MG) BY MOUTH DAILY 90 tablet 1  . ranitidine (ZANTAC) 150 MG capsule Take 150 mg by mouth daily.    . rosuvastatin (CRESTOR) 20 MG tablet TAKE 1 TABLET BY MOUTH DAILY 90 tablet 1  . sertraline (ZOLOFT) 100 MG tablet Take 1 tablet (100 mg total) by mouth daily. 90 tablet 3  . zolpidem (AMBIEN) 10 MG tablet Take 1 tablet (10 mg total) by mouth at bedtime as needed for sleep. 90 tablet 1   No current facility-administered medications for this visit.     Objective: BP 120/82 (BP Location: Left Arm, Patient Position: Sitting, Cuff Size: Normal)   Pulse 80   Temp 98 F (36.7 C) (Oral)   Ht '5\' 6"'$  (1.676 m)   Wt 135 lb 3.2 oz (61.3 kg)   LMP 08/25/1992   SpO2 97%   BMI 21.82 kg/m  Gen: NAD, resting comfortably CV: RRR no murmurs rubs or gallops Lungs: CTAB no crackles, wheeze, rhonchi msk: tender in Paraspinous muscles of low back Ext: no edema Skin: warm, dry Neuro: grossly normal, moves all extremities and no lower extremity weakness  Assessment/Plan:  Depression S: Patient stopped her zoloft in last few months for "unknown reason" per her. It seems since that time she has had  multiple difficulties. Her depression has worsened though she states only mildly. Her Lorrin Mais that was helping her get to sleep was no longer adequate as back pain increased. She had to add tylenol pm at night as well as gabapentin to help her sleep- she would still wake up at times due to the back pain (se eback pain section).  A/P: I told patient that she should restart her zoloft at this point and follow up in 6 weeks with strict return precautions given. 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 States originally she presented with symptoms including a  feeling of being smothered at night and increased sputum production. Unfortunately, since December she has started coughing up some more sputum, having that smothering sensation and having som enight sweats though ash not assessed for fever at that time. Has had somve very minimal weight loss- 2 lbs in 5 months essentially A/P: Patient agrees to follow up with pulmonary after discussion today. Not clear if symptoms are related to prior MAI but she is having similar symptoms as initial presentation  Chronic low back pain S: patient has seen [facility] Dr. Ron Agee in the past for chronic low back pain. Reports issues at l4-l5 in the past. She states since coming off zoloft back pain seems to have worsened. She had injections that she states only lasted about a month a year ago but on other hand has not been complaining about back pain recently A/P: wonder if coming off zoloft affected pain threshold- patient agrees to restart. If not going in right direction with restart or worsens should see Dr .Ron Agee back. givne over a year- possible injection could be retrialed but she seems to think she was told would likely need MRI instead.    6 weeks or sooner if new or worsening symptoms specifically Si  Meds ordered this encounter  Medications  . diphenhydramine-acetaminophen (TYLENOL PM) 25-500 MG TABS tablet    Sig: Take 1 tablet by mouth at bedtime as needed.  . sertraline (ZOLOFT) 100 MG tablet    Sig: Take 1 tablet (100 mg total) by mouth daily.    Dispense:  90 tablet    Refill:  3    Return precautions advised.  Garret Reddish, MD

## 2016-11-18 NOTE — Progress Notes (Signed)
Pre visit review using our clinic review tool, if applicable. No additional management support is needed unless otherwise documented below in the visit note. 

## 2016-11-18 NOTE — Assessment & Plan Note (Signed)
S: patient has seen [facility] Dr. Ron Agee in the past for chronic low back pain. Reports issues at l4-l5 in the past. She states since coming off zoloft back pain seems to have worsened. She had injections that she states only lasted about a month a year ago but on other hand has not been complaining about back pain recently A/P: wonder if coming off zoloft affected pain threshold- patient agrees to restart. If not going in right direction with restart or worsens should see Dr .Ron Agee back. givne over a year- possible injection could be retrialed but she seems to think she was told would likely need MRI instead.

## 2016-11-18 NOTE — Assessment & Plan Note (Signed)
S: she states off antibiotics since last august. States originally she presented with symptoms including a feeling of being smothered at night and increased sputum production. Unfortunately, since December she has started coughing up some more sputum, having that smothering sensation and having som enight sweats though ash not assessed for fever at that time. Has had somve very minimal weight loss- 2 lbs in 5 months essentially A/P: Patient agrees to follow up with pulmonary after discussion today. Not clear if symptoms are related to prior MAI but she is having similar symptoms as initial presentation

## 2016-11-28 ENCOUNTER — Encounter: Payer: Self-pay | Admitting: Family Medicine

## 2016-11-28 ENCOUNTER — Ambulatory Visit (INDEPENDENT_AMBULATORY_CARE_PROVIDER_SITE_OTHER)
Admission: RE | Admit: 2016-11-28 | Discharge: 2016-11-28 | Disposition: A | Discharge status: Home / Self Care | Payer: Medicare Other | Source: Ambulatory Visit | Attending: Adult Health | Admitting: Adult Health

## 2016-11-28 ENCOUNTER — Encounter: Payer: Self-pay | Admitting: Adult Health

## 2016-11-28 ENCOUNTER — Ambulatory Visit (INDEPENDENT_AMBULATORY_CARE_PROVIDER_SITE_OTHER): Payer: Medicare Other | Admitting: Adult Health

## 2016-11-28 ENCOUNTER — Other Ambulatory Visit: Payer: Medicare Other

## 2016-11-28 VITALS — BP 118/74 | HR 66 | Ht 66.0 in | Wt 132.4 lb

## 2016-11-28 DIAGNOSIS — A310 Pulmonary mycobacterial infection: Secondary | ICD-10-CM | POA: Diagnosis not present

## 2016-11-28 DIAGNOSIS — R0602 Shortness of breath: Secondary | ICD-10-CM | POA: Diagnosis not present

## 2016-11-28 DIAGNOSIS — R05 Cough: Secondary | ICD-10-CM | POA: Diagnosis not present

## 2016-11-28 NOTE — Patient Instructions (Signed)
Sputum Culture and AFB culture today .  Chest xray today  Mucinex DM Twice daily  As needed  Cough;/congestion .  follow up Dr. Lake Bells in 6-8 weeks and As needed   Please contact office for sooner follow up if symptoms do not improve or worsen or seek emergency care

## 2016-11-28 NOTE — Assessment & Plan Note (Signed)
MAI with 1 year tx completed in 03/2016 . Pt is starting to recognize a few sx of cough , night sweats return  Check cxr today  Try to get sputum for AFB /Cx .  If cxr w/ changes, consider HRCT chest   Plan  Patient Instructions  Sputum Culture and AFB culture today .  Chest xray today  Mucinex DM Twice daily  As needed  Cough;/congestion .  follow up Dr. Lake Bells in 6-8 weeks and As needed   Please contact office for sooner follow up if symptoms do not improve or worsen or seek emergency care

## 2016-11-28 NOTE — Progress Notes (Signed)
_0  ID: Sandra Craig, female    DOB: Aug 11, 1940, 76 y.o.   MRN: [id number]  Chief Complaint  Patient presents with  . Follow-up    MAI     Referring provider: [name], MD  HPI: 38 female former smoker followed for MAC (completed treatment with Clarithromycin/Rifampin  Finished in 03/2016) .   TEST Sandra Craig  She was diagnosed with a bronchoscopy in June 2016. A CT scan in 2016 showed characteristic tree in bud abnormalities which were worrisome for Mycobacterium avium intercellular. She started 3 drug therapy in August 2016, but had to stop azithromycin after 2 weeks because of a rash. Started clarithomycin/ Rifampin - completed a 2 yr course. (allergic to Azithromycin d/t rash)    11/28/2016 Follow up : MAC  Pt returns for 6 month follow up for MAC. She was treated for MAC in 2016-2017 with Clarithromycin and Rifampin . Pt returns today and says shortly after stopping MAC therapy in August  her cough has started to return some.  Says the nighttime hot flashes have returned, also mild dyspnea and cough.  Denies wt loss or hemoptysis .  Appetite is good.  Last CT chest in 12/2015 showed mild tree in bud pattern in RUL .      Allergies  Allergen Reactions  . Azithromycin Rash    Pruritic rash diffuse   . Celecoxib   . Prednisone Other (See Comments)    Made pt feel "crazy"  . Sulfonamide Derivatives     Immunization History  Administered Date(s) Administered  . Influenza Split 05/23/2012  . Influenza Whole 05/31/2006, 05/28/2007, 05/14/2008, 04/22/2009, 05/01/2010, 04/12/2011  . Influenza, High Dose Seasonal PF 05/19/2013, 03/26/2014, 04/21/2016  . Influenza,inj,Quad PF,36+ Mos 04/15/2015  . Pneumococcal Conjugate-13 02/03/2015  . Pneumococcal Polysaccharide-23 05/31/2006, 06/07/2012  . Td 12/21/2009  . Zoster 02/06/2006    Past Medical History:  Diagnosis Date  . 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 Chronic insomnia   . Cystitis    . Diverticulitis of intestine without perforation or abscess without bleeding    Patient did have abscess but noperforation  . Diverticulosis of colon (without mention of hemorrhage)   . Endometriosis   . Family history of malignant neoplasm of gastrointestinal tract   . Fibromyalgia   . Gastritis   . Hiatal hernia   . HIATAL HERNIA 10/08/2008   Qualifier: Diagnosis of  By: Nils Pyle CMA ([place]), Mearl Latin    . History of gallstones   . Hyperlipidemia   . Hypertension   . IBS (irritable bowel syndrome)   . IC (interstitial cystitis)   . Internal hemorrhoid   . Osteonecrosis ([place])   . Osteopenia   . Osteoporosis   . Palpitations   . Polycystic kidney disease   . Pulmonary nodule 12/08   5 mm Anterior RUL  . Small bowel obstruction ([facility])     Tobacco History: History  Smoking Status  . Former Smoker  . Packs/day: 0.75  . Years: 8.00  . Types: Cigarettes  . Quit date: 08/01/1976  Smokeless Tobacco  . Never Used    Comment: Quit in 1978   Counseling given: Not Answered   Outpatient Encounter Prescriptions as of 11/28/2016  Medication Sig  . Calcium Carbonate-Vit D-Min (CALCIUM 1200 PO) Take 2 capsules by mouth daily.   Marland Kitchen gabapentin (NEURONTIN) 300 MG capsule TAKE 1 CAPSULE(300 MG) BY MOUTH TWICE DAILY  . losartan (COZAAR) 50 MG tablet TAKE 1 TABLET(50 MG) BY MOUTH DAILY  .  ranitidine (ZANTAC) 150 MG capsule Take 150 mg by mouth daily.  . rosuvastatin (CRESTOR) 20 MG tablet TAKE 1 TABLET BY MOUTH DAILY  . sertraline (ZOLOFT) 100 MG tablet Take 1 tablet (100 mg total) by mouth daily.  . [DISCONTINUED] gabapentin (NEURONTIN) 300 MG capsule Take 1 capsule (300 mg total) by mouth at bedtime.  Marland Kitchen zolpidem (AMBIEN) 10 MG tablet Take 1 tablet (10 mg total) by mouth at bedtime as needed for sleep.  . [DISCONTINUED] diphenhydramine-acetaminophen (TYLENOL PM) 25-500 MG TABS tablet Take 1 tablet by mouth at bedtime as needed.   No facility-administered encounter medications on file as of  11/28/2016.      Review of Systems  Constitutional:   No  weight loss, night sweats,  Fevers, chills,  +fatigue, or  lassitude.  HEENT:   No headaches,  Difficulty swallowing,  Tooth/dental problems, or  Sore throat,                No sneezing, itching, ear ache, nasal congestion, post nasal drip,   CV:  No chest pain,  Orthopnea, PND, swelling in lower extremities, anasarca, dizziness, palpitations, syncope.   GI  No heartburn, indigestion, abdominal pain, nausea, vomiting, diarrhea, change in bowel habits, loss of appetite, bloody stools.   Resp:   No chest wall deformity  Skin: no rash or lesions.  GU: no dysuria, change in color of urine, no urgency or frequency.  No flank pain, no hematuria   MS:  No joint pain or swelling.  No decreased range of motion.  No back pain.    Physical Exam  BP 118/74 (BP Location: Left Arm, Cuff Size: Normal)   Pulse 66   Ht _0  (1.676 m)   Wt 132 lb 6.4 oz (60.1 kg)   LMP 08/25/1992   SpO2 98%   BMI 21.37 kg/m   GEN: A/Ox3; pleasant , NAD, elderly    HEENT:  [place]/AT,  EACs-clear, TMs-wnl, NOSE-clear, THROAT-clear, no lesions, no postnasal drip or exudate noted.   NECK:  Supple w/ fair ROM; no JVD; normal carotid impulses w/o bruits; no thyromegaly or nodules palpated; no lymphadenopathy.    RESP  Clear  P & A; w/o, wheezes/ rales/ or rhonchi. no accessory muscle use, no dullness to percussion  CARD:  RRR, no m/r/g, no peripheral edema, pulses intact, no cyanosis or clubbing.  GI:   Soft & nt; nml bowel sounds; no organomegaly or masses detected.   Musco: Warm bil, no deformities or joint swelling noted.   Neuro: alert, no focal deficits noted.    Skin: Warm, no lesions or rashes    Lab Results:  BMET  BNP No results found for: BNP  ProBNP    Component Value Date/Time   PROBNP 40.1 03/04/2010 0852    Imaging: Dg Chest 2 View  Result Date: 11/28/2016 CLINICAL DATA:  Shortness of breath.  Cough. EXAM: CHEST  2  VIEW COMPARISON:  CT 01/08/2016.  Chest x-ray 11/06/2015 . FINDINGS: Mediastinum hilar structures are normal. No focal infiltrate. No pleural effusion or pneumothorax. Heart size normal. Mild thoracic spine scoliosis and degenerative change. Right shoulder replacement. Surgical clips right upper quadrant. IMPRESSION: No acute cardiopulmonary disease . 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 Pt is starting to recognize a few sx of cough , night sweats return  Check  cxr today  Try to get sputum for AFB /Cx .  If cxr w/ changes, consider HRCT chest   Plan  Patient Instructions  Sputum Culture and AFB culture today .  Chest xray today  Mucinex DM Twice daily  As needed  Cough;/congestion .  follow up Dr. Lake Bells in 6-8 weeks and As needed   Please contact office for sooner follow up if symptoms do not improve or worsen or seek emergency care         Rexene Edison, NP 11/28/2016

## 2016-11-30 ENCOUNTER — Telehealth: Payer: Self-pay | Admitting: Adult Health

## 2016-11-30 NOTE — Telephone Encounter (Signed)
lmom tcb x1    Notes recorded by Melvenia Needles, NP on 11/29/2016 at 1:31 PM EDT CXR does not show any sign of pna or acute changes  Cont w/ ov recs

## 2016-11-30 NOTE — Progress Notes (Signed)
LMOMTCB x 1 

## 2016-12-01 NOTE — Telephone Encounter (Signed)
Patient returned phone call. °

## 2016-12-01 NOTE — Progress Notes (Signed)
Please see phone note from 4.30.2018. Will sign off.

## 2016-12-01 NOTE — Telephone Encounter (Signed)
Called and spoke to pt. Informed her of the results and recs per TP. Pt verbalized understanding and denied any further questions or concerns at this time.

## 2016-12-05 NOTE — Progress Notes (Signed)
Reviewed, agree 

## 2016-12-06 DIAGNOSIS — H02839 Dermatochalasis of unspecified eye, unspecified eyelid: Secondary | ICD-10-CM | POA: Diagnosis not present

## 2016-12-06 DIAGNOSIS — H2512 Age-related nuclear cataract, left eye: Secondary | ICD-10-CM | POA: Diagnosis not present

## 2016-12-06 DIAGNOSIS — Z961 Presence of intraocular lens: Secondary | ICD-10-CM | POA: Diagnosis not present

## 2016-12-06 DIAGNOSIS — H25042 Posterior subcapsular polar age-related cataract, left eye: Secondary | ICD-10-CM | POA: Diagnosis not present

## 2016-12-06 DIAGNOSIS — H18413 Arcus senilis, bilateral: Secondary | ICD-10-CM | POA: Diagnosis not present

## 2016-12-06 DIAGNOSIS — H26492 Other secondary cataract, left eye: Secondary | ICD-10-CM | POA: Diagnosis not present

## 2016-12-06 DIAGNOSIS — H25012 Cortical age-related cataract, left eye: Secondary | ICD-10-CM | POA: Diagnosis not present

## 2016-12-12 ENCOUNTER — Other Ambulatory Visit: Payer: Self-pay | Admitting: Family Medicine

## 2016-12-12 DIAGNOSIS — M545 Low back pain: Secondary | ICD-10-CM | POA: Diagnosis not present

## 2016-12-14 ENCOUNTER — Encounter: Payer: Self-pay | Admitting: Gynecology

## 2016-12-17 DIAGNOSIS — M545 Low back pain: Secondary | ICD-10-CM | POA: Diagnosis not present

## 2016-12-21 DIAGNOSIS — M47817 Spondylosis without myelopathy or radiculopathy, lumbosacral region: Secondary | ICD-10-CM | POA: Diagnosis not present

## 2016-12-21 DIAGNOSIS — M545 Low back pain: Secondary | ICD-10-CM | POA: Diagnosis not present

## 2016-12-22 ENCOUNTER — Other Ambulatory Visit: Payer: Self-pay | Admitting: Family Medicine

## 2016-12-28 DIAGNOSIS — Z1231 Encounter for screening mammogram for malignant neoplasm of breast: Secondary | ICD-10-CM | POA: Diagnosis not present

## 2016-12-28 LAB — HM MAMMOGRAPHY

## 2017-01-02 ENCOUNTER — Encounter: Payer: Self-pay | Admitting: Family Medicine

## 2017-01-10 DIAGNOSIS — M47817 Spondylosis without myelopathy or radiculopathy, lumbosacral region: Secondary | ICD-10-CM | POA: Diagnosis not present

## 2017-01-10 DIAGNOSIS — M545 Low back pain: Secondary | ICD-10-CM | POA: Diagnosis not present

## 2017-01-17 ENCOUNTER — Ambulatory Visit (INDEPENDENT_AMBULATORY_CARE_PROVIDER_SITE_OTHER): Payer: Medicare Other | Admitting: Pulmonary Disease

## 2017-01-17 ENCOUNTER — Encounter: Payer: Self-pay | Admitting: Pulmonary Disease

## 2017-01-17 VITALS — BP 126/74 | HR 84 | Ht 66.0 in | Wt 134.0 lb

## 2017-01-17 DIAGNOSIS — R05 Cough: Secondary | ICD-10-CM

## 2017-01-17 DIAGNOSIS — R059 Cough, unspecified: Secondary | ICD-10-CM

## 2017-01-17 DIAGNOSIS — A310 Pulmonary mycobacterial infection: Secondary | ICD-10-CM

## 2017-01-17 NOTE — Progress Notes (Signed)
Subjective:    Patient ID: Sandra Craig, female    DOB: 1941-03-20, 76 y.o.   MRN: [id number]  Synopsis: Former patient of Dr. 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  She was initially followed for a pulmonary nodule in 2008. She used to smoke and quit in 1977 after ten years.  < 1 ppd.  She was diagnosed with a bronchoscopy in June 2016. A CT scan in 2016 showed characteristic tree in bud abnormalities which were worrisome for Mycobacterium avium intercellular. She started 3 drug therapy in August 2016, but had to stop azithromycin after 2 weeks because of a rash. Started clarithomycin.   Finished treatment in August 2017.  HPI Chief Complaint  Patient presents with  . Follow-up    pt c/o increased chest heaviness with exertion.  States it feels like "an extreme adrenaline rush" with exertion.  Denies cough, mucus production.     Sandra Craig says that she still feels some shortness of breath with a hacking cough and hoarseness.  She has some dyspnea.  She notes some night sweats.  She has not been having the "sheet soaking" night sweats. She feels like this is consistent with her prior episodes of MAI.     Past Medical History:  Diagnosis Date  . 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 Chronic insomnia   . Cystitis   . Diverticulitis of intestine without perforation or abscess without bleeding    Patient did have abscess but noperforation  . Diverticulosis of colon (without mention of hemorrhage)   . Endometriosis   . Family history of malignant neoplasm of gastrointestinal tract   . Fibromyalgia   . Gastritis   . Hiatal hernia   . HIATAL HERNIA 10/08/2008   Qualifier: Diagnosis of  By: Nils Pyle CMA ([place]), Mearl Latin    . History of gallstones   . Hyperlipidemia   . Hypertension   . IBS (irritable bowel syndrome)   . IC (interstitial cystitis)   . Internal hemorrhoid   . Osteonecrosis ([place])   . Osteopenia   . Osteoporosis   . Palpitations   .  Polycystic kidney disease   . Pulmonary nodule 12/08   5 mm Anterior RUL  . Small bowel obstruction ([facility])       Review of Systems     Objective:   Physical Exam Vitals:   01/17/17 1511  BP: 126/74  Pulse: 84  SpO2: 98%  Weight: 134 lb (60.8 kg)  Height: 5\' 6"  (1.676 m)    RA  Gen: well appearing HENT: OP clear, TM's clear, neck supple PULM: CTA B, normal percussion CV: RRR, no mgr, trace edema GI: BS+, soft, nontender Derm: no cyanosis or rash Psyche: normal mood and affect        Assessment & Plan:  MAI (mycobacterium avium-intracellulare) infection ([facility]) She has mycobacterial disease associated bronchiectasis. She is experiencing more night sweats subjective fevers and cough which she says is consistent with her prior flareup. However, she did not like taking the medicines in the past and she would prefer to avoid treatment again which is reasonable.  We spent an extensive amount of time talking about indications for therapy. She is in a "gray zone" in that she does have some symptoms but they're not severe so we can make an argument to treat but considering the fact that she had side effects from her regimen last time it's probably best to hold off for now.  Plan: Check a CT scan of the  chest, if there is evidence of significant worsening pulmonary nodules or cavities and that would push me towards treating her sooner rather than later Close follow-up with a 3 month visit to see how she's doing and readdress this issue  Greater than 50% of this 26 spent this face-to-face    Current Outpatient Prescriptions:  .  Calcium Carbonate-Vit D-Min (CALCIUM 1200 PO), Take 2 capsules by mouth daily. , Disp: , Rfl:  .  gabapentin (NEURONTIN) 300 MG capsule, TAKE 1 CAPSULE(300 MG) BY MOUTH TWICE DAILY, Disp: 180 capsule, Rfl: 1 .  losartan (COZAAR) 50 MG tablet, TAKE 1 TABLET(50 MG) BY MOUTH DAILY, Disp: 90 tablet, Rfl: 1 .  ranitidine (ZANTAC) 150 MG capsule, Take 150 mg  by mouth daily., Disp: , Rfl:  .  rosuvastatin (CRESTOR) 20 MG tablet, TAKE 1 TABLET BY MOUTH DAILY, Disp: 90 tablet, Rfl: 1 .  sertraline (ZOLOFT) 100 MG tablet, Take 1 tablet (100 mg total) by mouth daily., Disp: 90 tablet, Rfl: 3 .  zolpidem (AMBIEN) 10 MG tablet, TAKE 1 TABLET BY MOUTH AT BEDTIME AS NEEDED FOR SLEEP, Disp: 90 tablet, Rfl: 1

## 2017-01-17 NOTE — Patient Instructions (Signed)
We will arrange for a CT scan of your chest to see if there is evidence of worsening mycobacterial disease. If so I will call you with the results and we will talk about a treatment plan Keep taking your acid reflux therapy twice a day as you're doing We will see you back in 2-3 months to see how you are doing or sooner if needed

## 2017-01-17 NOTE — Assessment & Plan Note (Signed)
She has mycobacterial disease associated bronchiectasis. She is experiencing more night sweats subjective fevers and cough which she says is consistent with her prior flareup. However, she did not like taking the medicines in the past and she would prefer to avoid treatment again which is reasonable.  We spent an extensive amount of time talking about indications for therapy. She is in a "gray zone" in that she does have some symptoms but they're not severe so we can make an argument to treat but considering the fact that she had side effects from her regimen last time it's probably best to hold off for now.  Plan: Check a CT scan of the chest, if there is evidence of significant worsening pulmonary nodules or cavities and that would push me towards treating her sooner rather than later Close follow-up with a 3 month visit to see how she's doing and readdress this issue  Greater than 50% of this 26 spent this face-to-face

## 2017-01-20 ENCOUNTER — Ambulatory Visit (INDEPENDENT_AMBULATORY_CARE_PROVIDER_SITE_OTHER)
Admission: RE | Admit: 2017-01-20 | Discharge: 2017-01-20 | Disposition: A | Discharge status: Home / Self Care | Payer: Medicare Other | Source: Ambulatory Visit | Attending: Pulmonary Disease | Admitting: Pulmonary Disease

## 2017-01-20 DIAGNOSIS — R059 Cough, unspecified: Secondary | ICD-10-CM

## 2017-01-20 DIAGNOSIS — R05 Cough: Secondary | ICD-10-CM

## 2017-01-20 DIAGNOSIS — J439 Emphysema, unspecified: Secondary | ICD-10-CM | POA: Diagnosis not present

## 2017-02-09 ENCOUNTER — Encounter: Payer: Self-pay | Admitting: Family Medicine

## 2017-02-09 ENCOUNTER — Ambulatory Visit (INDEPENDENT_AMBULATORY_CARE_PROVIDER_SITE_OTHER): Payer: Medicare Other | Admitting: Family Medicine

## 2017-02-09 DIAGNOSIS — M797 Fibromyalgia: Secondary | ICD-10-CM

## 2017-02-09 DIAGNOSIS — I10 Essential (primary) hypertension: Secondary | ICD-10-CM

## 2017-02-09 DIAGNOSIS — F322 Major depressive disorder, single episode, severe without psychotic features: Secondary | ICD-10-CM

## 2017-02-09 MED ORDER — DULOXETINE HCL 30 MG PO CPEP: 30.0000 mg | ORAL_CAPSULE | Freq: Two times a day (BID) | ORAL | 5 refills | Status: DC

## 2017-02-09 NOTE — Assessment & Plan Note (Signed)
S: controlled on losartan 50mg . Dizzy and home BPs often down into the 90s.  BP Readings from Last 3 Encounters:  02/09/17 140/82  01/17/17 126/74  11/28/16 118/74  A/P: We discussed blood pressure goal of <140/90 ideally but with orthostasis symptoms will target <150/90. Continue current meds:  But decrease dose to 25mg  and follow up 2-3 weeks with home cuff

## 2017-02-09 NOTE — Progress Notes (Signed)
Subjective:  Sandra Craig is a 76 y.o. year old very pleasant female patient who presents for/with See problem oriented charting ROS- admits to depressed mood, feeling withdrawn, anxiety. No SI. Breathing has not worsened.    Past Medical History-  Patient Active Problem List   Diagnosis Date Noted  . 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 Chronic low back pain 12/20/2013    Priority: High  . Fibromyalgia 12/05/2007    Priority: High  . History of small bowel obstruction 06/28/2011    Priority: Medium  . GERD with stricture 06/28/2011    Priority: Medium  . Polycystic kidney disease     Priority: Medium  . Hypertension     Priority: Medium  . Hyperlipidemia     Priority: Medium  . Osteoporosis 12/21/2009    Priority: Medium  . INSOMNIA, CHRONIC 10/08/2008    Priority: Medium  . Depression 02/08/2008    Priority: Medium  . Chronic right shoulder pain 03/07/2014    Priority: Low  . Benign essential tremor 10/09/2013    Priority: Low  . Hyperglycemia 06/27/2012    Priority: Low  . Diverticulitis large intestine 02/11/2012    Priority: Low  . Irritable bowel syndrome (IBS) 06/28/2011    Priority: Low  . IC (interstitial cystitis)     Priority: Low  . Hemorrhoids 10/08/2008    Priority: Low  . 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 HEMATURIA UNSPECIFIED 07/04/2008    Priority: Low  . Lung nodules 08/09/2007    Priority: Low    Medications- reviewed and updated Current Outpatient Prescriptions  Medication Sig Dispense Refill  . Calcium Carbonate-Vit D-Min (CALCIUM 1200 PO) Take 2 capsules by mouth daily.     Marland Kitchen gabapentin (NEURONTIN) 300 MG capsule TAKE 1 CAPSULE(300 MG) BY MOUTH TWICE DAILY 180 capsule 1  . losartan (COZAAR) 50 MG tablet TAKE 1 TABLET(50 MG) BY MOUTH DAILY 90 tablet 1  . ranitidine (ZANTAC) 150 MG capsule Take 150 mg by mouth daily.    . rosuvastatin  (CRESTOR) 20 MG tablet TAKE 1 TABLET BY MOUTH DAILY 90 tablet 1  . sertraline (ZOLOFT) 100 MG tablet Take 1 tablet (100 mg total) by mouth daily. 90 tablet 3  . zolpidem (AMBIEN) 10 MG tablet TAKE 1 TABLET BY MOUTH AT BEDTIME AS NEEDED FOR SLEEP 90 tablet 1   No current facility-administered medications for this visit.     Objective: BP 140/82 (BP Location: Left Arm, Patient Position: Sitting, Cuff Size: Large)   Pulse 71   Temp (!) 97.5 F (36.4 C) (Oral)   Ht 5\' 6"  (1.676 m)   Wt 134 lb 9.6 oz (61.1 kg)   LMP 08/25/1992   SpO2 96%   BMI 21.73 kg/m  Gen: NAD, resting comfortably CV: RRR no murmurs rubs or gallops Lungs: CTAB no crackles, wheeze, rhonchi Ext: no edema Skin: warm, dry Psych: teatful during visit, depressed mood  Assessment/Plan:  Depression S: Patient at last visit in April had stopped her zoloft for uknown reasons. Had mild worsening of depression, worsening in her back pain. We restarted her zoloft at that time at 100mg   Chronic pain in back seeing- Dr. Ron Agee for L4-L5 issues. MRI. Injections x2 and short term improvement only  PHQ9 today is 20 and GAD7 of 16.   No thoughts of harming herself but has a regular feeling of impending doom.   Counseling in the past  has not helped including meeting with Dr. Glennon Hamilton.  A/P: I encouraged her to spend time talking with her best friend at least once a week- patient has withdrawn from prior daily conversation. Encouraged counseling though not helpful before given severity of symptoms- she agrees to consider.   We will stop zoloft. Start cymbalta- go to BID dosing in 1 week. May help with chronic back pain and fibromyalgia pain which is a burden. Financially helping brother with cancer is a strain on her and that is unlikely to change. Follow up 2-3 weeks.   Hypertension S: controlled on losartan 50mg . Dizzy and home BPs often down into the 90s.  BP Readings from Last 3 Encounters:  02/09/17 140/82  01/17/17 126/74   11/28/16 118/74  A/P: We discussed blood pressure goal of <140/90 ideally but with orthostasis symptoms will target <150/90. Continue current meds:  But decrease dose to 25mg  and follow up 2-3 weeks with home cuff  Fibromyalgia Gabapentin helps some. Hopeful cymbalta will give additional benefit- also hopeful helps with chronic low back pain  2-3 weeks  Meds ordered this encounter  Medications  . DULoxetine (CYMBALTA) 30 MG capsule    Sig: Take 1 capsule (30 mg total) by mouth 2 (two) times daily.    Dispense:  60 capsule    Refill:  5    Return precautions advised.  Garret Reddish, MD

## 2017-02-09 NOTE — Assessment & Plan Note (Signed)
Gabapentin helps some. Hopeful cymbalta will give additional benefit- also hopeful helps with chronic low back pain

## 2017-02-09 NOTE — Patient Instructions (Addendum)
Today will be your last day of zoloft  Start 1 cymbalta each morning for next week  In 1 week increase to 1 cymbalta twice a day  Follow up in 2-3 weeks  Contact us immediately with any thoughts of harming yourself  Losartan - cut in half. Bring in your cuff with you to next visit.   Handout for behavioral health given

## 2017-02-09 NOTE — Assessment & Plan Note (Signed)
S: Patient at last visit in April had stopped her zoloft for uknown reasons. Had mild worsening of depression, worsening in her back pain. We restarted her zoloft at that time at 100mg   Chronic pain in back seeing- Dr. Ron Agee for L4-L5 issues. MRI. Injections x2 and short term improvement only  PHQ9 today is 20 and GAD7 of 16.   No thoughts of harming herself but has a regular feeling of impending doom.   Counseling in the past has not helped including meeting with Dr. Glennon Hamilton.  A/P: I encouraged her to spend time talking with her best friend at least once a week- patient has withdrawn from prior daily conversation. Encouraged counseling though not helpful before given severity of symptoms- she agrees to consider.   We will stop zoloft. Start cymbalta- go to BID dosing in 1 week. May help with chronic back pain and fibromyalgia pain which is a burden. Financially helping brother with cancer is a strain on her and that is unlikely to change. Follow up 2-3 weeks.

## 2017-02-27 ENCOUNTER — Encounter: Payer: Self-pay | Admitting: Family Medicine

## 2017-03-07 ENCOUNTER — Telehealth: Payer: Self-pay | Admitting: Family Medicine

## 2017-03-07 NOTE — Telephone Encounter (Signed)
Patient Name: Sandra Craig  DOB: 1941/03/02    Initial Comment [name] states she has had two episodes today of loss of muscle control and almost fainting. When she stands up, these symptoms start happening. She has had some med changes in the last two weeks.   Nurse Assessment  Nurse: Thad Ranger RN, Langley Gauss Date/Time (Eastern Time): 03/07/2017 1:36:56 PM  Confirm and document reason for call. If symptomatic, describe symptoms. ---[name] states she has had two episodes today of loss of muscle control and almost fainting. When she stands up, these symptoms start happening. She has had some med changes in the last two weeks. States the MD cut her BP in half. He decreased dose for Losartan Potassium. She has already has taken the med today. He decreased med due to hypotension.  Does the patient have any new or worsening symptoms? ---Yes  Will a triage be completed? ---Yes  Related visit to physician within the last 2 weeks? ---Yes  Does the PT have any chronic conditions? (i.e. diabetes, asthma, etc.) ---Yes  List chronic conditions. ---HTN, GERD  Is this a behavioral health or substance abuse call? ---No     Guidelines    Guideline Title Affirmed Question Affirmed Notes  Dizziness - Lightheadedness SEVERE dizziness (e.g., unable to stand, requires support to walk, feels like passing out now)    Final Disposition User   Go to ED Now (or PCP triage) Thad Ranger, RN, Langley Gauss    Comments  Pt is currently in [place] and flies back home today with flight leaving at 1700. She got off of a [employer] and got very dizzy having a near syncopal episode. A MD was in her group, layed her flat w/feet elevated and she recovered. She is sitting in a chair now, and RN asked her to stand. She stood w/o ambuating, and she became dizzy with legs/arms/back having a "trembling" feeling. Advised to lay flat w/feet elevated above heart, and HOLD the Losartan until further orders from MD. Advised to drink 8-10,  eight oz glasses water, Gatorade/Powerade, or Pedialyte hourly to improve hydration status. Pt does not want to go to an ER or UCC at this time and wants to wait until her return home this eve. She asked RN to make an appt w/her MD tomorrow. Advised if she decides she is not going to be seen now, she is to use a wheel chair at all times for mobilization until she can be seen by an MD. Advised to lay down or recline as she is able during the trip home. Advised she can let the airlines know she will need additional asst in her return home. Advised to cb when she returns home this eve for f/u, and if improved, RN may be able to make an appt at that time, but RN will need to re-evaluate her first. Verb understanding.   Referrals  GO TO FACILITY REFUSED   Disagree/Comply: Disagree  Disagree/Comply Reason: Disagree with instructions

## 2017-03-07 NOTE — Telephone Encounter (Signed)
Note states that pt is out of town and due to fly back home today. She is to contact office when she returns to town if she would like to be assessed and/or scheduled for appointment.

## 2017-03-08 NOTE — Telephone Encounter (Signed)
LMTCB

## 2017-03-08 NOTE — Telephone Encounter (Signed)
Spoke with pt and she states that she does feel some better. She flew home from [place] last night. On her way home from the airport she did stop by a fire station and asked them to check her blood pressure, at that time it was 168/98. She states that this morning she took it at home and it was 126/64. She has had no more pre-syncopal episodes. She did also just finish an oral prednisone taper for her back pain and wonders if this may have contributed to her symptoms. She has been scheduled to see Dr. Yong Channel on Friday and advised to contact office if symptoms return. Nothing further needed at this time.

## 2017-03-08 NOTE — Telephone Encounter (Addendum)
Pt is returning sheena call. Pt was offered to see another provider today

## 2017-03-10 ENCOUNTER — Ambulatory Visit (INDEPENDENT_AMBULATORY_CARE_PROVIDER_SITE_OTHER): Payer: Medicare Other | Admitting: Family Medicine

## 2017-03-10 ENCOUNTER — Encounter: Payer: Self-pay | Admitting: Family Medicine

## 2017-03-10 VITALS — BP 122/80 | HR 75 | Temp 98.3°F | Ht 66.0 in | Wt 134.4 lb

## 2017-03-10 DIAGNOSIS — R319 Hematuria, unspecified: Secondary | ICD-10-CM

## 2017-03-10 DIAGNOSIS — G8929 Other chronic pain: Secondary | ICD-10-CM

## 2017-03-10 DIAGNOSIS — R42 Dizziness and giddiness: Secondary | ICD-10-CM | POA: Diagnosis not present

## 2017-03-10 DIAGNOSIS — F322 Major depressive disorder, single episode, severe without psychotic features: Secondary | ICD-10-CM | POA: Diagnosis not present

## 2017-03-10 DIAGNOSIS — R55 Syncope and collapse: Secondary | ICD-10-CM | POA: Diagnosis not present

## 2017-03-10 DIAGNOSIS — I10 Essential (primary) hypertension: Secondary | ICD-10-CM | POA: Diagnosis not present

## 2017-03-10 DIAGNOSIS — M545 Low back pain, unspecified: Secondary | ICD-10-CM

## 2017-03-10 LAB — URINALYSIS, ROUTINE W REFLEX MICROSCOPIC
Bilirubin Urine: NEGATIVE
Ketones, ur: NEGATIVE
Leukocytes, UA: NEGATIVE
Nitrite: NEGATIVE
Specific Gravity, Urine: 1.025 (ref 1.000–1.030)
Total Protein, Urine: NEGATIVE
Urine Glucose: NEGATIVE
Urobilinogen, UA: 0.2 (ref 0.0–1.0)
pH: 6 (ref 5.0–8.0)

## 2017-03-10 LAB — CBC
HCT: 37.9 % (ref 36.0–46.0)
Hemoglobin: 12.1 g/dL (ref 12.0–15.0)
MCHC: 31.9 g/dL (ref 30.0–36.0)
MCV: 89.7 fl (ref 78.0–100.0)
Platelets: 220 10*3/uL (ref 150.0–400.0)
RBC: 4.23 Mil/uL (ref 3.87–5.11)
RDW: 14.6 % (ref 11.5–15.5)
WBC: 6.1 10*3/uL (ref 4.0–10.5)

## 2017-03-10 LAB — COMPREHENSIVE METABOLIC PANEL
ALT: 21 U/L (ref 0–35)
AST: 17 U/L (ref 0–37)
Albumin: 3.9 g/dL (ref 3.5–5.2)
Alkaline Phosphatase: 55 U/L (ref 39–117)
BUN: 24 mg/dL — ABNORMAL HIGH (ref 6–23)
CO2: 32 mEq/L (ref 19–32)
Calcium: 8.9 mg/dL (ref 8.4–10.5)
Chloride: 108 mEq/L (ref 96–112)
Creatinine, Ser: 1.42 mg/dL — ABNORMAL HIGH (ref 0.40–1.20)
GFR: 38.35 mL/min — ABNORMAL LOW (ref >60.00)
Glucose, Bld: 88 mg/dL (ref 70–99)
Potassium: 4.1 mEq/L (ref 3.5–5.1)
Sodium: 142 mEq/L (ref 135–145)
Total Bilirubin: 0.5 mg/dL (ref 0.2–1.2)
Total Protein: 6.1 g/dL (ref 6.0–8.3)

## 2017-03-10 LAB — TSH: TSH: 1.63 u[IU]/mL (ref 0.35–4.50)

## 2017-03-10 NOTE — Progress Notes (Signed)
Subjective:  Sandra Craig is a 76 y.o. year old very pleasant female patient who presents for/with See problem oriented charting ROS- had lightheadedness, felt shaky- earlier this week- no recurrenc.e no chest pain or shortness of breath with this   Past Medical History-  Patient Active Problem List   Diagnosis Date Noted  . 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 Chronic low back pain 12/20/2013    Priority: High  . Fibromyalgia 12/05/2007    Priority: High  . Near syncope 03/11/2017    Priority: Medium  . History of small bowel obstruction 06/28/2011    Priority: Medium  . GERD with stricture 06/28/2011    Priority: Medium  . Polycystic kidney disease     Priority: Medium  . Hypertension     Priority: Medium  . Hyperlipidemia     Priority: Medium  . Osteoporosis 12/21/2009    Priority: Medium  . INSOMNIA, CHRONIC 10/08/2008    Priority: Medium  . Depression 02/08/2008    Priority: Medium  . Chronic right shoulder pain 03/07/2014    Priority: Low  . Benign essential tremor 10/09/2013    Priority: Low  . Hyperglycemia 06/27/2012    Priority: Low  . Diverticulitis large intestine 02/11/2012    Priority: Low  . Irritable bowel syndrome (IBS) 06/28/2011    Priority: Low  . IC (interstitial cystitis)     Priority: Low  . Hemorrhoids 10/08/2008    Priority: Low  . 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 HEMATURIA UNSPECIFIED 07/04/2008    Priority: Low  . Lung nodules 08/09/2007    Priority: Low    Medications- reviewed and updated Current Outpatient Prescriptions  Medication Sig Dispense Refill  . Calcium Carbonate-Vit D-Min (CALCIUM 1200 PO) Take 2 capsules by mouth daily.     . DULoxetine (CYMBALTA) 30 MG capsule Take 1 capsule (30 mg total) by mouth 2 (two) times daily. 60 capsule 5  . gabapentin (NEURONTIN) 300 MG capsule TAKE 1 CAPSULE(300 MG) BY MOUTH TWICE DAILY 180  capsule 1  . losartan (COZAAR) 50 MG tablet TAKE 1 TABLET(50 MG) BY MOUTH DAILY 90 tablet 1  . ranitidine (ZANTAC) 150 MG capsule Take 150 mg by mouth daily.    . rosuvastatin (CRESTOR) 20 MG tablet TAKE 1 TABLET BY MOUTH DAILY 90 tablet 1  . zolpidem (AMBIEN) 10 MG tablet TAKE 1 TABLET BY MOUTH AT BEDTIME AS NEEDED FOR SLEEP 90 tablet 1   No current facility-administered medications for this visit.     Objective: BP 122/80 (BP Location: Left Arm, Patient Position: Sitting, Cuff Size: Large)   Pulse 75   Temp 98.3 F (36.8 C) (Oral)   Ht 5\' 6"  (1.676 m)   Wt 134 lb 6.4 oz (61 kg)   LMP 08/25/1992   SpO2 94%   BMI 21.69 kg/m  Gen: NAD, resting comfortably CV: RRR no murmurs rubs or gallops Lungs: CTAB no crackles, wheeze, rhonchi Abdomen: soft/nontender/nondistended/normal bowel sounds.  Ext: no edema Skin: warm, dry Neuro: CN II-XII intact, sensation and reflexes normal throughout, 5/5 muscle strength in bilateral upper and lower extremities. Normal finger to nose. Normal rapid alternating movements. No pronator drift. Normal romberg. Normal gait.   Assessment/Plan:  Hypertension S: controlled today on no medication. We had titrated down from 50mg  losartan to 25mg  due to lightheadedness previously. She stopped this after presycopal/near syncope episodes that were noted today   2  of home #s over 140 (one up to 160) but otherwise have been as low as 100/60.  BP Readings from Last 3 Encounters:  03/10/17 122/80  02/09/17 140/82  01/17/17 126/74  A/P: We discussed blood pressure goal of <140/90. Continue no meds. Home BP has been so highly variable and with presyncope and lower #s on BP medicines- we will have to remain off despite some #s above goal. In addition possible her back pain contributes to spikes in BP    HEMATURIA UNSPECIFIED S: has had urology workup before. Microscopic hematuria 3-6 does not ppear to be worsening A/P: reached out to patient through mychart/follow  up phone call to offer urology follow up for monitoring parameters but reassuring that this is not worsening. Notes of interstitial cystitis in past could contribute.    Chronic low back pain Will scan in report of most recent MRI which she drops off. High pain could contribute to elevated BP at times  Near syncope S: Patient was out of town and was walking for prolonged period out in the heat. Usually tries to stay well hydrated but admits she may not have been doing a great job. IN addition we have been titrating BP meds down due to lower BP meds (despite no clear change such as lower salt in diet).   She had 2 episodes where she became very lightheaded, shaky. With sitting down or laying down symptoms resolved- got better hydrated called our after hours line but declined ER follow up at that time despite suggestion A/P: High chance of vasovagal syncope in addition to needing less BP meds (see hypertension section). We discussed possible cardiac workup today but since she has had no recurrence of symptoms after stopping BP meds (including dizziness or presyncope) she would prefer to only do workup if has recurrence off of medications- I believe this is reasonable but gave strict return precautions.    Update labs to look for alternate presyncope cause- none found Orders Placed This Encounter  Procedures  . CBC    [place]  . Comprehensive metabolic panel    [place] [facility]  . Urinalysis    Standing Status:   Future    Number of Occurrences:   1    Standing Expiration Date:   03/10/2018  . TSH    [facility]  . Urinalysis, Routine w reflex microscopic   Return precautions advised.  Garret Reddish, MD

## 2017-03-10 NOTE — Patient Instructions (Signed)
Permanently discontinue losartan at this point. Some blood pressures may run up to 150 or 160 but considering other #s are as low as the 90s still just do not think you can tolerate blood pressure medicine  Please stop by lab before you go

## 2017-03-11 DIAGNOSIS — R55 Syncope and collapse: Secondary | ICD-10-CM | POA: Insufficient documentation

## 2017-03-11 NOTE — Assessment & Plan Note (Signed)
Will scan in report of most recent MRI which she drops off. High pain could contribute to elevated BP at times

## 2017-03-11 NOTE — Assessment & Plan Note (Signed)
S: controlled today on no medication. We had titrated down from 50mg  losartan to 25mg  due to lightheadedness previously. She stopped this after presycopal/near syncope episodes that were noted today   2 of home #s over 140 (one up to 160) but otherwise have been as low as 100/60.  BP Readings from Last 3 Encounters:  03/10/17 122/80  02/09/17 140/82  01/17/17 126/74  A/P: We discussed blood pressure goal of <140/90. Continue no meds. Home BP has been so highly variable and with presyncope and lower #s on BP medicines- we will have to remain off despite some #s above goal. In addition possible her back pain contributes to spikes in BP

## 2017-03-11 NOTE — Assessment & Plan Note (Signed)
S: Patient was out of town and was walking for prolonged period out in the heat. Usually tries to stay well hydrated but admits she may not have been doing a great job. IN addition we have been titrating BP meds down due to lower BP meds (despite no clear change such as lower salt in diet).   She had 2 episodes where she became very lightheaded, shaky. With sitting down or laying down symptoms resolved- got better hydrated called our after hours line but declined ER follow up at that time despite suggestion A/P: High chance of vasovagal syncope in addition to needing less BP meds (see hypertension section). We discussed possible cardiac workup today but since she has had no recurrence of symptoms after stopping BP meds (including dizziness or presyncope) she would prefer to only do workup if has recurrence off of medications- I believe this is reasonable but gave strict return precautions.

## 2017-03-11 NOTE — Assessment & Plan Note (Signed)
S: has had urology workup before. Microscopic hematuria 3-6 does not ppear to be worsening A/P: reached out to patient through mychart/follow up phone call to offer urology follow up for monitoring parameters but reassuring that this is not worsening. Notes of interstitial cystitis in past could contribute.

## 2017-03-24 DIAGNOSIS — H1045 Other chronic allergic conjunctivitis: Secondary | ICD-10-CM | POA: Diagnosis not present

## 2017-03-30 ENCOUNTER — Telehealth: Payer: Self-pay | Admitting: Family Medicine

## 2017-03-30 NOTE — Telephone Encounter (Signed)
° ° ° °  Pt would like a call back concerning her blood pressure

## 2017-03-31 NOTE — Telephone Encounter (Signed)
Lets have her record for next week and send [place] a log again at that ponit. I dont like the trend up but on other hand had syncopal event on medication so want more data points before making decision

## 2017-03-31 NOTE — Telephone Encounter (Signed)
Reporting blood pressure he last few days as:   August 28: 128/77   August 29: 145/83    August 30: 168/84 in the AM         175/94 in the PM  Please advise

## 2017-03-31 NOTE — Telephone Encounter (Incomplete)
Spoke with patient who states she has been monitoring her BP. She states the last few days she has had a headache and noticed that her BP has been creeping up. She was averaging around 118/72 and now is at

## 2017-04-04 NOTE — Telephone Encounter (Signed)
Spoke with patient who verbalized understanding.

## 2017-04-21 ENCOUNTER — Ambulatory Visit (INDEPENDENT_AMBULATORY_CARE_PROVIDER_SITE_OTHER): Payer: Medicare Other | Admitting: Pulmonary Disease

## 2017-04-21 ENCOUNTER — Encounter: Payer: Self-pay | Admitting: Pulmonary Disease

## 2017-04-21 VITALS — BP 126/68 | HR 71 | Ht 66.0 in | Wt 140.0 lb

## 2017-04-21 DIAGNOSIS — A310 Pulmonary mycobacterial infection: Secondary | ICD-10-CM

## 2017-04-21 NOTE — Progress Notes (Signed)
Subjective:    Patient ID: Sandra Craig, female    DOB: 12/01/1940, 76 y.o.   MRN: [id number]  Synopsis: Former patient of Dr. 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  She was initially followed for a pulmonary nodule in 2008. She used to smoke and quit in 1977 after ten years.  < 1 ppd.  She was diagnosed with a bronchoscopy in June 2016. A CT scan in 2016 showed characteristic tree in bud abnormalities which were worrisome for Mycobacterium avium intercellular. She started 3 drug therapy in August 2016, but had to stop azithromycin after 2 weeks because of a rash. Started clarithomycin.   Finished treatment in August 2017.  HPI Chief Complaint  Patient presents with  . Follow-up    pt states she is doing well, notes sob with exertion.  requesting high dose flu shot.     Karthika is doing well right now.   No recent episodes of bronchitis or pneumonia. Some stable shortness of breath when she exerts her self but this has not worsened. No unexplained fevers or chills or night sweats. She has been dealing with some low blood pressure recently and her primary care physician has adjusted her blood pressure medicines.  Past Medical History:  Diagnosis Date  . 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 Chronic insomnia   . Cystitis   . Diverticulitis of intestine without perforation or abscess without bleeding    Patient did have abscess but noperforation  . Diverticulosis of colon (without mention of hemorrhage)   . Endometriosis   . Family history of malignant neoplasm of gastrointestinal tract   . Fibromyalgia   . Gastritis   . Hiatal hernia   . HIATAL HERNIA 10/08/2008   Qualifier: Diagnosis of  By: Nils Pyle CMA ([place]), Mearl Latin    . History of gallstones   . Hyperlipidemia   . Hypertension   . IBS (irritable bowel syndrome)   . IC (interstitial cystitis)   . Internal hemorrhoid   . Osteonecrosis ([place])   . Osteopenia   . Osteoporosis   . Palpitations     . Polycystic kidney disease   . Pulmonary nodule 12/08   5 mm Anterior RUL  . 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 A CT scan performed this year did not show evidence of worsening bronchiectasis or radiographic suggestion of underlying atypical lung infection. At this time I see no reason to consider retreating her with antibiotics.  Plan: For history of MAI: Let us know if you're having increasing cough, mucus production, fevers or chills Otherwise, there is nothing different to do today We will give you a flu shot today  We will see you back in one year or sooner if needed    Current Outpatient Prescriptions:  .  Calcium Carbonate-Vit D-Min (  CALCIUM 1200 PO), Take 2 capsules by mouth daily. , Disp: , Rfl:  .  DULoxetine (CYMBALTA) 30 MG capsule, Take 1 capsule (30 mg total) by mouth 2 (two) times daily., Disp: 60 capsule, Rfl: 5 .  gabapentin (NEURONTIN) 300 MG capsule, TAKE 1 CAPSULE(300 MG) BY MOUTH TWICE DAILY, Disp: 180 capsule, Rfl: 1 .  ranitidine (ZANTAC) 150 MG capsule, Take 150 mg by mouth daily., Disp: , Rfl:  .  rosuvastatin (CRESTOR) 20 MG tablet, TAKE 1 TABLET BY MOUTH DAILY, Disp: 90 tablet, Rfl: 1 .  zolpidem (AMBIEN) 10 MG tablet, TAKE 1 TABLET BY MOUTH AT BEDTIME AS NEEDED FOR SLEEP, Disp: 90 tablet, Rfl: 1

## 2017-04-21 NOTE — Patient Instructions (Signed)
For history of MAI: Let us know if you're having increasing cough, mucus production, fevers or chills Otherwise, there is nothing different to do today We will give you a flu shot today  We will see you back in one year or sooner if needed

## 2017-05-15 DIAGNOSIS — H524 Presbyopia: Secondary | ICD-10-CM | POA: Diagnosis not present

## 2017-05-15 DIAGNOSIS — Z961 Presence of intraocular lens: Secondary | ICD-10-CM | POA: Diagnosis not present

## 2017-05-15 DIAGNOSIS — H04123 Dry eye syndrome of bilateral lacrimal glands: Secondary | ICD-10-CM | POA: Diagnosis not present

## 2017-05-15 DIAGNOSIS — H5211 Myopia, right eye: Secondary | ICD-10-CM | POA: Diagnosis not present

## 2017-05-15 DIAGNOSIS — H52223 Regular astigmatism, bilateral: Secondary | ICD-10-CM | POA: Diagnosis not present

## 2017-06-02 ENCOUNTER — Encounter (HOSPITAL_COMMUNITY): Payer: Self-pay | Admitting: Nurse Practitioner

## 2017-06-02 ENCOUNTER — Observation Stay (HOSPITAL_COMMUNITY)
Admission: EM | Admit: 2017-06-02 | Discharge: 2017-06-03 | Disposition: A | Discharge status: Home / Self Care | Payer: Medicare Other | Attending: Internal Medicine | Admitting: Internal Medicine

## 2017-06-02 ENCOUNTER — Telehealth: Payer: Self-pay | Admitting: *Deleted

## 2017-06-02 ENCOUNTER — Emergency Department (HOSPITAL_COMMUNITY): Payer: Medicare Other

## 2017-06-02 DIAGNOSIS — I10 Essential (primary) hypertension: Secondary | ICD-10-CM

## 2017-06-02 DIAGNOSIS — G459 Transient cerebral ischemic attack, unspecified: Principal | ICD-10-CM | POA: Insufficient documentation

## 2017-06-02 DIAGNOSIS — E785 Hyperlipidemia, unspecified: Secondary | ICD-10-CM | POA: Diagnosis not present

## 2017-06-02 DIAGNOSIS — N1832 Chronic kidney disease, stage 3b: Secondary | ICD-10-CM

## 2017-06-02 DIAGNOSIS — Z79899 Other long term (current) drug therapy: Secondary | ICD-10-CM | POA: Insufficient documentation

## 2017-06-02 DIAGNOSIS — N183 Chronic kidney disease, stage 3 unspecified: Secondary | ICD-10-CM

## 2017-06-02 DIAGNOSIS — R4701 Aphasia: Secondary | ICD-10-CM

## 2017-06-02 DIAGNOSIS — Z8673 Personal history of transient ischemic attack (TIA), and cerebral infarction without residual deficits: Secondary | ICD-10-CM | POA: Diagnosis present

## 2017-06-02 DIAGNOSIS — R55 Syncope and collapse: Secondary | ICD-10-CM | POA: Diagnosis not present

## 2017-06-02 DIAGNOSIS — Z87891 Personal history of nicotine dependence: Secondary | ICD-10-CM | POA: Diagnosis not present

## 2017-06-02 DIAGNOSIS — R404 Transient alteration of awareness: Secondary | ICD-10-CM | POA: Diagnosis not present

## 2017-06-02 DIAGNOSIS — Z96611 Presence of right artificial shoulder joint: Secondary | ICD-10-CM | POA: Insufficient documentation

## 2017-06-02 LAB — COMPREHENSIVE METABOLIC PANEL
ALT: 19 U/L (ref 14–54)
AST: 29 U/L (ref 15–41)
Albumin: 3.6 g/dL (ref 3.5–5.0)
Alkaline Phosphatase: 58 U/L (ref 38–126)
Anion gap: 5 (ref 5–15)
BUN: 19 mg/dL (ref 6–20)
CO2: 25 mmol/L (ref 22–32)
Calcium: 9.1 mg/dL (ref 8.9–10.3)
Chloride: 107 mmol/L (ref 101–111)
Creatinine, Ser: 1.43 mg/dL — ABNORMAL HIGH (ref 0.44–1.00)
GFR calc Af Amer: 40 mL/min — ABNORMAL LOW (ref >60)
GFR calc non Af Amer: 35 mL/min — ABNORMAL LOW (ref >60)
Glucose, Bld: 92 mg/dL (ref 65–99)
Potassium: 4.1 mmol/L (ref 3.5–5.1)
Sodium: 137 mmol/L (ref 135–145)
Total Bilirubin: 0.6 mg/dL (ref 0.3–1.2)
Total Protein: 6.2 g/dL — ABNORMAL LOW (ref 6.5–8.1)

## 2017-06-02 LAB — CBC
HCT: 38.6 % (ref 36.0–46.0)
Hemoglobin: 12.1 g/dL (ref 12.0–15.0)
MCH: 27.7 pg (ref 26.0–34.0)
MCHC: 31.3 g/dL (ref 30.0–36.0)
MCV: 88.3 fL (ref 78.0–100.0)
Platelets: 175 10*3/uL (ref 150–400)
RBC: 4.37 MIL/uL (ref 3.87–5.11)
RDW: 14.6 % (ref 11.5–15.5)
WBC: 5.4 10*3/uL (ref 4.0–10.5)

## 2017-06-02 LAB — I-STAT TROPONIN, ED: Troponin i, poc: 0 ng/mL (ref 0.00–0.08)

## 2017-06-02 LAB — DIFFERENTIAL
Basophils Absolute: 0 10*3/uL (ref 0.0–0.1)
Basophils Relative: 0 %
Eosinophils Absolute: 0.1 10*3/uL (ref 0.0–0.7)
Eosinophils Relative: 1 %
Lymphocytes Relative: 33 %
Lymphs Abs: 1.8 10*3/uL (ref 0.7–4.0)
Monocytes Absolute: 0.3 10*3/uL (ref 0.1–1.0)
Monocytes Relative: 6 %
Neutro Abs: 3.2 10*3/uL (ref 1.7–7.7)
Neutrophils Relative %: 60 %

## 2017-06-02 LAB — I-STAT CHEM 8, ED
BUN: 23 mg/dL — ABNORMAL HIGH (ref 6–20)
Calcium, Ion: 1.17 mmol/L (ref 1.15–1.40)
Chloride: 105 mmol/L (ref 101–111)
Creatinine, Ser: 1.4 mg/dL — ABNORMAL HIGH (ref 0.44–1.00)
Glucose, Bld: 91 mg/dL (ref 65–99)
HCT: 37 % (ref 36.0–46.0)
Hemoglobin: 12.6 g/dL (ref 12.0–15.0)
Potassium: 4.1 mmol/L (ref 3.5–5.1)
Sodium: 141 mmol/L (ref 135–145)
TCO2: 27 mmol/L (ref 22–32)

## 2017-06-02 LAB — APTT: aPTT: 36 seconds (ref 24–36)

## 2017-06-02 LAB — CBG MONITORING, ED: Glucose-Capillary: 95 mg/dL (ref 65–99)

## 2017-06-02 LAB — PROTIME-INR
INR: 0.98
Prothrombin Time: 12.9 seconds (ref 11.4–15.2)

## 2017-06-02 MED ORDER — GABAPENTIN 300 MG PO CAPS
300.0000 mg | ORAL_CAPSULE | Freq: Every day | ORAL | Status: DC
  Administered 2017-06-03: 300 mg via ORAL
  Filled 2017-06-02: qty 1

## 2017-06-02 MED ORDER — ASPIRIN 300 MG RE SUPP: 300.0000 mg | Freq: Every day | RECTAL | Status: DC

## 2017-06-02 MED ORDER — DULOXETINE HCL 30 MG PO CPEP
30.0000 mg | ORAL_CAPSULE | Freq: Two times a day (BID) | ORAL | Status: DC
  Administered 2017-06-02 – 2017-06-03 (×2): 30 mg via ORAL
  Filled 2017-06-02 (×2): qty 1

## 2017-06-02 MED ORDER — ASPIRIN 81 MG PO CHEW
324.0000 mg | CHEWABLE_TABLET | Freq: Once | ORAL | Status: AC
  Administered 2017-06-02: 324 mg via ORAL
  Filled 2017-06-02: qty 4

## 2017-06-02 MED ORDER — ACETAMINOPHEN 325 MG PO TABS: 650.0000 mg | ORAL_TABLET | Freq: Four times a day (QID) | ORAL | Status: DC | PRN

## 2017-06-02 MED ORDER — ZOLPIDEM TARTRATE 5 MG PO TABS
5.0000 mg | ORAL_TABLET | Freq: Every day | ORAL | Status: DC
  Administered 2017-06-02: 5 mg via ORAL
  Filled 2017-06-02: qty 1

## 2017-06-02 MED ORDER — ACETAMINOPHEN 325 MG PO TABS
650.0000 mg | ORAL_TABLET | Freq: Once | ORAL | Status: AC
  Administered 2017-06-02: 650 mg via ORAL
  Filled 2017-06-02: qty 2

## 2017-06-02 MED ORDER — SODIUM CHLORIDE 0.9 % IV SOLN: 250.0000 mL | INTRAVENOUS | Status: DC | PRN

## 2017-06-02 MED ORDER — ASPIRIN 325 MG PO TABS
325.0000 mg | ORAL_TABLET | Freq: Every day | ORAL | Status: DC
  Administered 2017-06-03: 325 mg via ORAL
  Filled 2017-06-02: qty 1

## 2017-06-02 MED ORDER — ACETAMINOPHEN 650 MG RE SUPP: 650.0000 mg | Freq: Four times a day (QID) | RECTAL | Status: DC | PRN

## 2017-06-02 MED ORDER — SODIUM CHLORIDE 0.9% FLUSH
3.0000 mL | Freq: Two times a day (BID) | INTRAVENOUS | Status: DC
  Administered 2017-06-02 – 2017-06-03 (×2): 3 mL via INTRAVENOUS

## 2017-06-02 MED ORDER — SODIUM CHLORIDE 0.9% FLUSH
3.0000 mL | Freq: Two times a day (BID) | INTRAVENOUS | Status: DC
  Administered 2017-06-02: 3 mL via INTRAVENOUS

## 2017-06-02 MED ORDER — STROKE: EARLY STAGES OF RECOVERY BOOK: Freq: Once | Status: DC

## 2017-06-02 MED ORDER — ROSUVASTATIN CALCIUM 20 MG PO TABS
20.0000 mg | ORAL_TABLET | Freq: Every day | ORAL | Status: DC
  Administered 2017-06-02: 20 mg via ORAL
  Filled 2017-06-02 (×2): qty 1

## 2017-06-02 MED ORDER — SODIUM CHLORIDE 0.9% FLUSH: 3.0000 mL | INTRAVENOUS | Status: DC | PRN

## 2017-06-02 NOTE — ED Notes (Signed)
Admitting provider at bedside for evaluation.

## 2017-06-02 NOTE — H&P (Signed)
History and Physical  Sandra Craig CVE:[id number] DOB: 1940/08/28 DOA: 06/02/2017  PCP: [name], MD  Patient coming from: home  Chief Complaint: couldn't speak  HPI:  76yow presented with transient aphasia. Referred for TIA evaluation.  Patient was on phone with her sister this AM when she suddenly was unable to speak--had words she wanted to say but only nonsense came out. Lasted about 8 minutes. No specific aggravating or alleviating factors. Resolved spontaneously. No associated symptoms. No numbness, no weakness, no facial droop.  In ED no focal deficits noted. CT head was negative and initial eval was unremarkable.  ED Course: afebrile, VSS, no hypoxia. Treated with ASA.  Review of Systems:  Negative for fever, new visual changes, sore throat, rash, new muscle aches, chest pain, SOB, dysuria, bleeding, n/v/abdominal pain.   Past Medical History:  Diagnosis Date  . 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 Chronic insomnia   . Cystitis   . Diverticulitis of intestine without perforation or abscess without bleeding    Patient did have abscess but noperforation  . Diverticulosis of colon (without mention of hemorrhage)   . Endometriosis   . Family history of malignant neoplasm of gastrointestinal tract   . Fibromyalgia   . Gastritis   . Hiatal hernia   . HIATAL HERNIA 10/08/2008   Qualifier: Diagnosis of  By: Nils Pyle CMA ([place]), Mearl Latin    . History of gallstones   . Hyperlipidemia   . Hypertension   . IBS (irritable bowel syndrome)   . IC (interstitial cystitis)   . Internal hemorrhoid   . Osteonecrosis ([place])   . Osteopenia   . Osteoporosis   . Palpitations   . Polycystic kidney disease   . Pulmonary nodule 12/08   5 mm Anterior RUL  . Small bowel obstruction [facility])     Past Surgical History:  Procedure Laterality Date  . ABDOMINAL HYSTERECTOMY  1994   TAH,BSO FOR ENDOMETRIOSIS  . ABDOMINAL SURGERY  2011   small intestine blockage    . CHOLECYSTECTOMY    . GASTROPLASTY  2011   small bowel resection -open  . JOINT REPLACEMENT  2008  . OOPHORECTOMY  1994   TAH,BSO  . PELVIC LAPAROSCOPY    . S/P right shoulder rotater cuff  200216/2011   Tear/adhesive capsulitis  . SBO Lap  11   Adhesions and small internal hernia  . SHOULDER HEMI-ARTHROPLASTY  06/05/2012   Procedure: SHOULDER HEMI-ARTHROPLASTY;  Surgeon: Johnny Bridge, MD;  Location: [place];  Service: Orthopedics;  Laterality: Right;  FOR ARTHRITIS  . TOTAL HIP ARTHROPLASTY  FALL OF 2008   rt. partial hip replacement  . TOTAL SHOULDER ARTHROPLASTY  06/05/2012   Procedure: TOTAL SHOULDER ARTHROPLASTY;  Surgeon: Johnny Bridge, MD;  Location: [place];  Service: Orthopedics;  Laterality: Right;  RIGHT SHOULDER TOTAL ARTHROPLASTY, HEMIARTHROPLASTY, SHOULDER, FOR ARTHRITIS  . VIDEO BRONCHOSCOPY Bilateral 01/28/2015   Procedure: VIDEO BRONCHOSCOPY WITH FLUORO;  Surgeon: Collene Gobble, MD;  Location: [place];  Service: Cardiopulmonary;  Laterality: Bilateral;     reports that she quit smoking about 40 years ago. Her smoking use included Cigarettes. She has a 6.00 pack-year smoking history. She has never used smokeless tobacco. She reports that she does not drink alcohol or use drugs. Mobility: ambulatory  Allergies  Allergen Reactions  . Azithromycin Rash    Pruritic rash diffuse   . Celecoxib Nausea Only  . Sulfonamide Derivatives Rash    Family History  Problem Relation  Age of Onset  . Heart disease Mother        MI at age 79  . Emphysema Mother   . Thyroid disease Mother        Thyroidectomy/Benign  . Lymphoma Maternal Grandmother   . Colon cancer Paternal Grandmother   . COPD Father   . Cancer Brother        adenocarcinoma right lung     Prior to Admission medications   Medication Sig Start Date End Date Taking? Authorizing Provider  Calcium Carbonate-Vit D-Min (CALCIUM 1200 PO) Take 2 capsules by mouth daily.    Yes [provider]   DULoxetine (CYMBALTA) 30 MG capsule Take 1 capsule (30 mg total) by mouth 2 (two) times daily. 02/09/17  Yes [name], MD  gabapentin (NEURONTIN) 300 MG capsule TAKE 1 CAPSULE(300 MG) BY MOUTH TWICE DAILY Patient taking differently: TAKE 1 CAPSULE(300 MG) BY mouth in the evening 07/29/16  Yes [name], MD  Polyethyl Glycol-Propyl Glycol (SYSTANE) 0.4-0.3 % GEL ophthalmic gel Place 1 application into both eyes 3 (three) times daily as needed (dry eye).   Yes [provider]  rosuvastatin (CRESTOR) 20 MG tablet TAKE 1 TABLET BY MOUTH DAILY Patient taking differently: TAKE 20 mg TABLET BY MOUTH in the evening 12/13/16  Yes [name], MD  zolpidem (AMBIEN) 10 MG tablet TAKE 1 TABLET BY MOUTH AT BEDTIME AS NEEDED FOR SLEEP Patient taking differently: TAKE 10 mg TABLET BY MOUTH AT BEDTIME AS NEEDED FOR SLEEP 12/23/16  Yes [name], MD    Physical Exam:  Vitals:   06/02/17 1700 06/02/17 1727  BP:  (!) 142/87  Pulse: 63 69  Resp:    Temp:    SpO2: 97% 97%   Constitutional:  . Appears calm and comfortable Eyes:  . pupils and irises appear normal . Normal lids  ENMT:  . grossly normal hearing  . Lips appear normal Neck:  . neck appears normal, no masses, normal ROM, supple . no thyromegaly Respiratory:  . CTA bilaterally, no w/r/r.  . Respiratory effort normal. No retractions or accessory muscle use Cardiovascular:  . RRR, no m/r/g . No LE extremity edema   Abdomen:  . Soft, ntnd No hepatomegaly Musculoskeletal:  . RUE, LUE, RLE, LLE   o strength and tone normal, no atrophy, no abnormal movements o No tenderness, masses Skin:  . No rashes, lesions, ulcers . palpation of skin: no induration or nodules Neurologic:  . CN 2-12 intact . Sensation all 4 extremities intact . No pronator drift . No UE dysdiadokinesis Psychiatric:  . judgement and insight appear normal . Mental status o Mood, affect appropriate   Wt Readings from Last  3 Encounters:  06/02/17 63 kg (139 lb)  04/21/17 63.5 kg (140 lb)  03/10/17 61 kg (134 lb 6.4 oz)    I have personally reviewed following labs and imaging studies  Labs:   Creatinine at baseline 1.4  LFTs unremarkable  CBC unremarkable  Imaging studies:   CT negative  Medical tests:   EKG SR, no acute changes.   Principal Problem:   TIA (transient ischemic attack) Active Problems:   Hyperlipidemia   Essential hypertension   CKD (chronic kidney disease), stage III (HCC)   Assessment/Plan TIA with expressive aphasia resolved prior to presentation. No focal deficits. -ASA, TIA workup MRI brain, MRA head, carotid u/s, echocardiogram -f/u neurology recommendations  Essential HTN -patient reports being taken off anti-hypertensives. Allow permissive hypertension.  Hyperlipidemia -continue  statin  CKD stage III -at baseline  Severity of Illness: The appropriate patient status for this patient is OBSERVATION. Observation status is judged to be reasonable and necessary in order to provide the required intensity of service to ensure the patient's safety. The patient's presenting symptoms, physical exam findings, and initial radiographic and laboratory data in the context of their medical condition is felt to place them at decreased risk for further clinical deterioration. Furthermore, it is anticipated that the patient will be medically stable for discharge from the hospital within 2 midnights of admission. The following factors support the patient status of observation.    DVT prophylaxis: early ambulation Code Status: full Family Communication: sister at bedside Consults called: neurology by ED    Time spent: 68 minutes  Murray Hodgkins, MD  [facility] Direct contact: [phone] --[place]  --[email or web address]; password [email or web address]  7PM-7AM contact night coverage as above  06/02/2017, 5:29 PM

## 2017-06-02 NOTE — Telephone Encounter (Signed)
Thankful patient arrived in ER

## 2017-06-02 NOTE — Telephone Encounter (Signed)
Pt called office stating that she was talking with her sister 30-45 minutes ago and her words were not coming out right. She states some words were coming out backwards and at times she could not get her words out. She states that her sister told her something was wrong. Pt also states that she has been checking her blood pressure at home and her machine keeps reading error. States her highest reading was 168/90. Also states she has had a headache for about a month, right leg tingling and pain yesterday that is getting better. I instructed pt that she needs to go to the Emergency Room. Pt states she would like to see Dr Yong Channel in office. I spoke with Dr Yong Channel who also suggests she call 911. I explained this to pt and offered to call 911 for her. Pt states that she will call 911. Pt alert during conversation, still having a hard time recalling some words. Hung up with pt and pt is to call 911.

## 2017-06-02 NOTE — ED Notes (Signed)
Attempted to call report at this time.  RN unavailable.

## 2017-06-02 NOTE — Consult Note (Signed)
Neurology Consultation  Reason for Consult: Speech abnormalities Referring Physician: Dr. Sarajane Jews  CC: Word finding difficulty, resolved  History is obtained from: Patient  HPI: Sandra Craig is a 76 y.o. female was a past medical history of chronic kidney disease, fibromyalgia, hypertension, hyperlipidemia, who was in her usual state of health up until around 12 PM today, when she was on the phone with her sister, and noted that she has problems finding words and speaking.  This episode lasted a total of 8 minutes.  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 She says she also had some other neurological symptoms yesterday, around 7 PM she had numbness of her right leg and some weakness.  She went to bed around 1130 with that numbness and weakness, which had resolved, when she woke up this morning. She has not had such symptoms before.  She denied any prior strokes.  She denies any preceding illnesses including fevers chills nausea vomiting.  No headache.  No visual changes.  No lateralized numbness or weakness other than the one specified above. Currently she reports no problems.  LKW: 12 PM on 06/02/2017 tpa given?: no, NIHSS 0 Premorbid modified Rankin scale (mRS): 0  ROS: A 14 point ROS was performed and is negative except as noted in the HPI.  Past Medical History:  Diagnosis Date  . 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 Chronic insomnia   . Cystitis   . Diverticulitis of intestine without perforation or abscess without bleeding    Patient did have abscess but noperforation  . Diverticulosis of colon (without mention of hemorrhage)   . Endometriosis   . Family history of malignant neoplasm of gastrointestinal tract   . Fibromyalgia   . Gastritis   . Hiatal hernia   . HIATAL HERNIA 10/08/2008   Qualifier: Diagnosis of  By: Nils Pyle CMA ([place]), Mearl Latin    . History of gallstones   . Hyperlipidemia    . Hypertension   . IBS (irritable bowel syndrome)   . IC (interstitial cystitis)   . Internal hemorrhoid   . Osteonecrosis ([place])   . Osteopenia   . Osteoporosis   . Palpitations   . Polycystic kidney disease   . Pulmonary nodule 12/08   5 mm Anterior RUL  . Small bowel obstruction (HCC)     Family History  Problem Relation Age of Onset  . Heart disease Mother        MI at age 39  . Emphysema Mother   . Thyroid disease Mother        Thyroidectomy/Benign  . Lymphoma Maternal Grandmother   . Colon cancer Paternal Grandmother   . COPD Father   . Cancer Brother        adenocarcinoma right lung    Social History:   reports that she quit smoking about 40 years ago. Her smoking use included Cigarettes. She has a 6.00 pack-year smoking history. She has never used smokeless tobacco. She reports that she does not drink alcohol or use drugs.  Medications  Current Facility-Administered Medications:  .   stroke: mapping our early stages of recovery book, , Does not apply, Once, Samuella Cota, MD .  0.9 %  sodium chloride infusion, 250 mL, Intravenous, PRN, Samuella Cota, MD .  acetaminophen (TYLENOL) tablet 650 mg, 650 mg, Oral, Q6H PRN **OR** acetaminophen (TYLENOL) suppository 650 mg, 650 mg, Rectal, Q6H PRN, Sarajane Jews,  Melene Plan, MD .  Derrill Memo ON 06/03/2017] aspirin suppository 300 mg, 300 mg, Rectal, Daily **OR** [START ON 06/03/2017] aspirin tablet 325 mg, 325 mg, Oral, Daily, Samuella Cota, MD .  DULoxetine (CYMBALTA) DR capsule 30 mg, 30 mg, Oral, BID, Samuella Cota, MD .  gabapentin (NEURONTIN) capsule 300 mg, 300 mg, Oral, Daily, Samuella Cota, MD .  rosuvastatin (CRESTOR) tablet 20 mg, 20 mg, Oral, Daily, Samuella Cota, MD .  sodium chloride flush (NS) 0.9 % injection 3 mL, 3 mL, Intravenous, Q12H, Samuella Cota, MD .  sodium chloride flush (NS) 0.9 % injection 3 mL, 3 mL, Intravenous, Q12H, Samuella Cota, MD .  sodium chloride flush (NS)  0.9 % injection 3 mL, 3 mL, Intravenous, PRN, Samuella Cota, MD .  zolpidem (AMBIEN) tablet 10 mg, 10 mg, Oral, QHS, Samuella Cota, MD  Exam: Current vital signs: BP (!) 142/87   Pulse 73   Temp 98.4 F (36.9 C)   Resp 16   Ht 5\' 6"  (1.676 m)   Wt 63 kg (139 lb)   LMP 08/25/1992   SpO2 97%   BMI 22.44 kg/m  Vital signs in last 24 hours: Temp:  [98.3 F (36.8 C)-98.4 F (36.9 C)] 98.4 F (36.9 C) (11/02 1500) Pulse Rate:  [63-74] 73 (11/02 1745) Resp:  [11-21] 16 (11/02 1615) BP: (133-168)/(82-93) 142/87 (11/02 1727) SpO2:  [96 %-99 %] 97 % (11/02 1745) Weight:  [63 kg (139 lb)] 63 kg (139 lb) (11/02 1356) GENERAL: Awake, alert in NAD HEENT: - Normocephalic and atraumatic, dry mm, no LN++, no Thyromegally LUNGS - Clear to auscultation bilaterally with no wheezes CV - S1S2 RRR, no m/r/g, equal pulses bilaterally. ABDOMEN - Soft, nontender, nondistended with normoactive BS Ext: warm, well perfused, intact peripheral pulses, no edema  NEURO:  Mental Status: AA&Ox3  Language: speech is clear.  Naming, repetition, fluency, and comprehension intact. Memory: 3 word registration and recall 3/3 Cranial Nerves: PERRL 65mm/brisk. EOMI, visual fields full, no facial asymmetry, facial sensation intact, hearing intact, tongue/uvula/soft palate midline, normal sternocleidomastoid and trapezius muscle strength. No evidence of tongue atrophy or fibrillations Motor: 5/5 bilaterally upper and lower extremities Tone: is normal and bulk is normal Sensation- Intact to light touch bilaterally Coordination: FTN intact bilaterally, no ataxia in BLE. Gait- deferred  Labs I have reviewed labs in epic and the results pertinent to this consultation are: CBC    Component Value Date/Time   WBC 5.4 06/02/2017 1358   RBC 4.37 06/02/2017 1358   HGB 12.6 06/02/2017 1405   HCT 37.0 06/02/2017 1405   PLT 175 06/02/2017 1358   MCV 88.3 06/02/2017 1358   MCH 27.7 06/02/2017 1358   MCHC 31.3  06/02/2017 1358   RDW 14.6 06/02/2017 1358   LYMPHSABS 1.8 06/02/2017 1358   MONOABS 0.3 06/02/2017 1358   EOSABS 0.1 06/02/2017 1358   BASOSABS 0.0 06/02/2017 1358    CMP     Component Value Date/Time   NA 141 06/02/2017 1405   NA 138 10/05/2016   K 4.1 06/02/2017 1405   CL 105 06/02/2017 1405   CO2 25 06/02/2017 1358   GLUCOSE 91 06/02/2017 1405   BUN 23 (H) 06/02/2017 1405   BUN 22 (A) 10/05/2016   CREATININE 1.40 (H) 06/02/2017 1405   CREATININE 1.33 (H) 06/10/2015 1452   CALCIUM 9.1 06/02/2017 1358   CALCIUM 10.3 12/21/2009 2250   PROT 6.2 (L) 06/02/2017 1358   ALBUMIN 3.6 06/02/2017 1358  AST 29 06/02/2017 1358   ALT 19 06/02/2017 1358   ALKPHOS 58 06/02/2017 1358   BILITOT 0.6 06/02/2017 1358   GFRNONAA 35 (L) 06/02/2017 1358   GFRNONAA 40 (L) 06/10/2015 1452   GFRAA 40 (L) 06/02/2017 1358   GFRAA 46 (L) 06/10/2015 1452   Lipid Panel     Component Value Date/Time   CHOL 154 03/04/2015 1416   TRIG 163.0 (H) 03/04/2015 1416   TRIG 103 05/26/2006 0745   HDL 48.40 03/04/2015 1416   CHOLHDL 3 03/04/2015 1416   VLDL 32.6 03/04/2015 1416   LDLCALC 73 03/04/2015 1416   LDLDIRECT 171.2 09/26/2006 0936   Imaging I have reviewed the images obtained:  CT-scan of the brain-no acute changes, mild bifrontal superficial atrophy.  Assessment:  76 year old woman past history of chronic kidney disease, fiber myalgia, hypertension and hyperlipidemia with transient episode of word finding difficulty, that has since resolved. Concern for transient ischemic attack versus stroke given her risk factors.  Impression: Evaluate for TIA  Recommendations: -Admit to hospitalist or observation -Telemetry monitoring -Allow for permissive hypertension for the first 24-48h - only treat PRN if SBP >220 mmHg. Blood pressures can be gradually normalized to SBP<140 upon discharge. -MRI without contrast (avoid administration of contrast giving brain renal function) -Carotid  dopplers -Echocardiogram -HgbA1c, fasting lipid panel -Frequent neuro checks -Prophylactic therapy-Antiplatelet med: Aspirin - dose 325mg  PO or 300mg  PR -Atorvastatin 80 mg PO daily -Risk factor modification -PT consult, OT consult, Speech consult  Please page stroke NP/PA/MD (listed on AMION)  from 8am-4 pm as this patient will be followed by the stroke team at this point.  -- Amie Portland, MD [facility] [phone] If 7pm to 7am, please call on call as listed on AMION.

## 2017-06-02 NOTE — ED Triage Notes (Addendum)
Per EMS pt is from home was taking on phone to friend and has approximately 8 minute episode of expressive aphasia. Pt denied unilateral weakness, blurry vision or known facial droop. Pt sts she has had a few episodes like this in the past but have not lasted this long. No neuro deficits noted presently.

## 2017-06-02 NOTE — ED Notes (Signed)
Neurologist at bedside for evaluation

## 2017-06-02 NOTE — ED Provider Notes (Signed)
[facility] EMERGENCY DEPARTMENT Provider Note   CSN: [id number] Arrival date & time: 06/02/17  1350     History   Chief Complaint Chief Complaint  Patient presents with  . Aphasia    HPI Sandra Craig is a 76 y.o. female.  Patient with history of hypertension, high cholesterol, family history of TIA and an older sister --presents with complaint of acute onset of the speech difficulty today for approximately 8 minutes while talking on the telephone.  Patient reports using incorrect words, having difficulty forming words and with her overall speech.  Symptoms then resolved.  Patient did not having associated weakness in her arms or legs, facial droop, slurred speech, vision change or loss.  She was encouraged by family members to call her doctor who advised her to go directly to the hospital.  No associated headache, fever, neck pain.  No chest pain or shortness of breath.  No treatments prior to arrival.       Past Medical History:  Diagnosis Date  . 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 Chronic insomnia   . Cystitis   . Diverticulitis of intestine without perforation or abscess without bleeding    Patient did have abscess but noperforation  . Diverticulosis of colon (without mention of hemorrhage)   . Endometriosis   . Family history of malignant neoplasm of gastrointestinal tract   . Fibromyalgia   . Gastritis   . Hiatal hernia   . HIATAL HERNIA 10/08/2008   Qualifier: Diagnosis of  By: Nils Pyle CMA ([place]), Mearl Latin    . History of gallstones   . Hyperlipidemia   . Hypertension   . IBS (irritable bowel syndrome)   . IC (interstitial cystitis)   . Internal hemorrhoid   . Osteonecrosis ([place])   . Osteopenia   . Osteoporosis   . Palpitations   . Polycystic kidney disease   . Pulmonary nodule 12/08   5 mm Anterior RUL  . Small bowel obstruction [facility])     Patient Active Problem List   Diagnosis Date Noted  . Near syncope  03/11/2017  . 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 Chronic right shoulder pain 03/07/2014  . Chronic low back pain 12/20/2013  . Benign essential tremor 10/09/2013  . Hyperglycemia 06/27/2012  . Diverticulitis large intestine 02/11/2012  . Irritable bowel syndrome (IBS) 06/28/2011  . History of small bowel obstruction 06/28/2011  . 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 Polycystic kidney disease   . Hypertension   . Hyperlipidemia   . IC (interstitial cystitis)   . Osteoporosis 12/21/2009  . INSOMNIA, CHRONIC 10/08/2008  . Hemorrhoids 10/08/2008  . 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 HEMATURIA UNSPECIFIED 07/04/2008  . Depression 02/08/2008  . Fibromyalgia 12/05/2007  . Lung nodules 08/09/2007    Past Surgical History:  Procedure Laterality Date  . ABDOMINAL HYSTERECTOMY  1994   TAH,BSO FOR ENDOMETRIOSIS  . ABDOMINAL SURGERY  2011   small intestine blockage  . CHOLECYSTECTOMY    . GASTROPLASTY  2011   small bowel resection -open  . JOINT REPLACEMENT  2008  . OOPHORECTOMY  1994   TAH,BSO  . PELVIC LAPAROSCOPY    . S/P right shoulder rotater cuff  200216/2011   Tear/adhesive capsulitis  . SBO Lap  11   Adhesions and small internal hernia  . SHOULDER HEMI-ARTHROPLASTY  06/05/2012   Procedure: SHOULDER HEMI-ARTHROPLASTY;  Surgeon: Johnny Bridge, MD;  Location: [place];  Service: Orthopedics;  Laterality: Right;  FOR ARTHRITIS  . TOTAL HIP ARTHROPLASTY  FALL OF 2008   rt. partial hip replacement  . TOTAL SHOULDER ARTHROPLASTY  06/05/2012   Procedure: TOTAL SHOULDER ARTHROPLASTY;  Surgeon: Johnny Bridge, MD;  Location: [place];  Service: Orthopedics;  Laterality: Right;  RIGHT SHOULDER TOTAL ARTHROPLASTY, HEMIARTHROPLASTY, SHOULDER, FOR ARTHRITIS  . VIDEO BRONCHOSCOPY Bilateral 01/28/2015   Procedure: VIDEO BRONCHOSCOPY WITH FLUORO;  Surgeon: Collene Gobble, MD;  Location: [place];  Service: Cardiopulmonary;  Laterality:  Bilateral;    OB History    Gravida Para Term Preterm AB Living   1 0     1 0   SAB TAB Ectopic Multiple Live Births   1               Home Medications    Prior to Admission medications   Medication Sig Start Date End Date Taking? Authorizing Provider  Calcium Carbonate-Vit D-Min (CALCIUM 1200 PO) Take 2 capsules by mouth daily.     [provider]  DULoxetine (CYMBALTA) 30 MG capsule Take 1 capsule (30 mg total) by mouth 2 (two) times daily. 02/09/17   [name], MD  gabapentin (NEURONTIN) 300 MG capsule TAKE 1 CAPSULE(300 MG) BY MOUTH TWICE DAILY 07/29/16   [name], MD  ranitidine (ZANTAC) 150 MG capsule Take 150 mg by mouth daily.    [provider]  rosuvastatin (CRESTOR) 20 MG tablet TAKE 1 TABLET BY MOUTH DAILY 12/13/16   [name], MD  zolpidem (AMBIEN) 10 MG tablet TAKE 1 TABLET BY MOUTH AT BEDTIME AS NEEDED FOR SLEEP 12/23/16   [name], MD    Family History Family History  Problem Relation Age of Onset  . Heart disease Mother        MI at age 79  . Emphysema Mother   . Thyroid disease Mother        Thyroidectomy/Benign  . Lymphoma Maternal Grandmother   . Colon cancer Paternal Grandmother   . COPD Father   . Cancer Brother        adenocarcinoma right lung    Social History Social History  Substance Use Topics  . Smoking status: Former Smoker    Packs/day: 0.75    Years: 8.00    Types: Cigarettes    Quit date: 08/01/1976  . Smokeless tobacco: Never Used     Comment: Quit in 1978  . Alcohol use No     Allergies   Azithromycin; Celecoxib; Prednisone; and Sulfonamide derivatives   Review of Systems Review of Systems  Constitutional: Negative for fever.  HENT: Negative for congestion, dental problem, rhinorrhea and sinus pressure.   Eyes: Negative for photophobia, discharge, redness and visual disturbance.  Respiratory: Negative for shortness of breath.   Cardiovascular: Negative for chest pain.    Gastrointestinal: Negative for nausea and vomiting.  Musculoskeletal: Negative for gait problem, neck pain and neck stiffness.  Skin: Negative for rash.  Neurological: Positive for speech difficulty. Negative for syncope, weakness, light-headedness, numbness and headaches.  Psychiatric/Behavioral: Negative for confusion.     Physical Exam Updated Vital Signs BP (!) 151/87   Pulse 65   Temp 98.4 F (36.9 C)   Resp (!) 21   Ht 5\' 6"  (1.676 m)   Wt 63 kg (139 lb)   LMP 08/25/1992   SpO2 98%   BMI 22.44 kg/m   Physical Exam  Constitutional: She is oriented to person, place, and time. She appears well-developed and well-nourished.  HENT:  Head: Normocephalic and atraumatic.  Right Ear: Tympanic membrane, external ear and ear canal normal.  Left Ear: Tympanic membrane, external ear and ear canal normal.  Nose: Nose normal.  Mouth/Throat: Uvula is midline, oropharynx is clear and moist and mucous membranes are normal.  Eyes: Pupils are equal, round, and reactive to light. Conjunctivae, EOM and lids are normal. Right eye exhibits no nystagmus. Left eye exhibits no nystagmus.  Neck: Normal range of motion. Neck supple.  Cardiovascular: Normal rate and regular rhythm.   Pulmonary/Chest: Effort normal and breath sounds normal.  Abdominal: Soft. There is no tenderness.  Musculoskeletal:       Cervical back: She exhibits normal range of motion, no tenderness and no bony tenderness.  Neurological: She is alert and oriented to person, place, and time. She has normal strength and normal reflexes. No cranial nerve deficit or sensory deficit. She displays a negative Romberg sign. Coordination and gait normal. GCS eye subscore is 4. GCS verbal subscore is 5. GCS motor subscore is 6.  Skin: Skin is warm and dry.  Psychiatric: She has a normal mood and affect.  Nursing note and vitals reviewed.    ED Treatments / Results  Labs (all labs ordered are listed, but only abnormal results are  displayed) Labs Reviewed  COMPREHENSIVE METABOLIC PANEL - Abnormal; Notable for the following:       Result Value   Creatinine, Ser 1.43 (*)    Total Protein 6.2 (*)    GFR calc non Af Amer 35 (*)    GFR calc Af Amer 40 (*)    All other components within normal limits  I-STAT CHEM 8, ED - Abnormal; Notable for the following:    BUN 23 (*)    Creatinine, Ser 1.40 (*)    All other components within normal limits  PROTIME-INR  APTT  CBC  DIFFERENTIAL  I-STAT TROPONIN, ED  CBG MONITORING, ED    EKG  EKG Interpretation None       Radiology Ct Head Wo Contrast  Result Date: 06/02/2017 CLINICAL DATA:  Episode of the aphasia lasting 8 minutes today. EXAM: CT HEAD WITHOUT CONTRAST TECHNIQUE: Contiguous axial images were obtained from the base of the skull through the vertex without intravenous contrast. COMPARISON:  08/16/2008 MRI FINDINGS: Brain: Mild bifrontal superficial atrophy. Chronic appearing minimal small vessel ischemic disease of periventricular white matter and corona radiata. Tiny lacunar infarcts are suggested of the basal ganglia. No large vascular territory infarcts. No acute intracranial hemorrhage, midline shift or edema. Vascular: No hyperdense vessels. Mild-to-moderate atherosclerosis of the carotid siphons. Skull: Normal. Negative for fracture or focal lesion. Sinuses/Orbits: Bilateral concha bullosa of the middle turbinates. No acute sinus disease. Intact orbits and globes. Partial opacification of the mastoids. Other: None IMPRESSION: 1. No acute intracranial abnormality by CT. 2. Mild bifrontal superficial atrophy. Gyri do not have the knife like appearance typically seen in frontotemporal lobar degeneration that can be associated with primary progressive aphasia however. 3. 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 Tiny lacunar infarcts are suggested of the basal ganglia. 4. Small mastoid effusions bilaterally. Electronically Signed   By:  Ashley Royalty M.D.   On: 06/02/2017 15:45    Procedures Procedures (including critical care time)  Medications Ordered in ED Medications  acetaminophen (TYLENOL) tablet 650 mg (not administered)     Initial Impression / Assessment and Plan / ED Course  I have reviewed the triage vital signs and the nursing notes.  Pertinent labs &  imaging results that were available during my care of the patient were reviewed by me and considered in my medical decision making (see chart for details).     Patient seen and examined. Work-up initiated.   Vital signs reviewed and are as follows: BP (!) 151/87   Pulse 65   Temp 98.4 F (36.9 C)   Resp (!) 21   Ht 5\' 6"  (1.676 m)   Wt 63 kg (139 lb)   LMP 08/25/1992   SpO2 98%   BMI 22.44 kg/m   Patient discussed with and seen by Dr. Jeneen Rinks. Plan: Neuro consult, admit for TIA work-up.   Spoke with Dr. Rory Percy. Neurology to consult on patient.   Spoke with Dr. Sarajane Jews who will see and admit.   Final Clinical Impressions(s) / ED Diagnoses   Final diagnoses:  TIA (transient ischemic attack)  Expressive aphasia   Admit for TIA work-up.   New Prescriptions New Prescriptions   No medications on file     Carlisle Cater, Hershal Coria 06/02/17 1609    Tanna Furry, MD 06/12/17 [phone]

## 2017-06-02 NOTE — ED Provider Notes (Signed)
Pt seen and evaluated. D/W J. Geiple PA-C. Pt describes 8 minute of aphasia. Normal exam here [facility]-). Agree with MRI and stroke team eval.   Tanna Furry, MD 06/02/17 [phone]

## 2017-06-03 ENCOUNTER — Observation Stay (HOSPITAL_COMMUNITY): Payer: Medicare Other

## 2017-06-03 ENCOUNTER — Observation Stay (HOSPITAL_BASED_OUTPATIENT_CLINIC_OR_DEPARTMENT_OTHER): Payer: Medicare Other

## 2017-06-03 ENCOUNTER — Encounter (HOSPITAL_COMMUNITY): Payer: Self-pay | Admitting: General Practice

## 2017-06-03 DIAGNOSIS — R4701 Aphasia: Secondary | ICD-10-CM | POA: Diagnosis not present

## 2017-06-03 DIAGNOSIS — N183 Chronic kidney disease, stage 3 (moderate): Secondary | ICD-10-CM | POA: Diagnosis not present

## 2017-06-03 DIAGNOSIS — G459 Transient cerebral ischemic attack, unspecified: Secondary | ICD-10-CM

## 2017-06-03 DIAGNOSIS — I10 Essential (primary) hypertension: Secondary | ICD-10-CM

## 2017-06-03 DIAGNOSIS — E785 Hyperlipidemia, unspecified: Secondary | ICD-10-CM | POA: Diagnosis not present

## 2017-06-03 LAB — LIPID PANEL
Cholesterol: 142 mg/dL (ref 0–200)
HDL: 64 mg/dL (ref >40)
LDL Cholesterol: 53 mg/dL (ref 0–99)
Total CHOL/HDL Ratio: 2.2 RATIO
Triglycerides: 123 mg/dL (ref <150)
VLDL: 25 mg/dL (ref 0–40)

## 2017-06-03 LAB — HEMOGLOBIN A1C
Hgb A1c MFr Bld: 5.7 % — ABNORMAL HIGH (ref 4.8–5.6)
Mean Plasma Glucose: 116.89 mg/dL

## 2017-06-03 LAB — ECHOCARDIOGRAM COMPLETE
Height: 66 in
Weight: 2224 oz

## 2017-06-03 MED ORDER — ASPIRIN EC 325 MG PO TBEC: 325.0000 mg | DELAYED_RELEASE_TABLET | Freq: Every day | ORAL | 0 refills | Status: DC

## 2017-06-03 MED ORDER — LORAZEPAM 2 MG/ML IJ SOLN
1.0000 mg | Freq: Once | INTRAMUSCULAR | Status: AC
  Administered 2017-06-03: 1 mg via INTRAVENOUS
  Filled 2017-06-03: qty 1

## 2017-06-03 NOTE — Discharge Instructions (Signed)

## 2017-06-03 NOTE — Discharge Summary (Addendum)
Physician Discharge Summary  LEOTHA WESTERMEYER UTM:[id number] DOB: Jun 30, 1941  PCP: [name], MD  Admit date: 06/02/2017 Discharge date: 06/03/2017  Recommendations for Outpatient Follow-up:  1. Dr. Garret Reddish, PCP in one week with repeat labs (BMP). [place], NP/Neurology in 6 weeks. Ambulatory referral was sent from the hospital. 3. [facility] for arranging 30 day CardioNet monitoring (in basket message sent to coordinator).  Home Health: None Equipment/Devices: None    Discharge Condition: Improved and stable  CODE STATUS: Full  Diet recommendation: Heart healthy diet.  Discharge Diagnoses:  Principal Problem:   TIA (transient ischemic attack) Active Problems:   Hyperlipidemia   Essential hypertension   CKD (chronic kidney disease), stage III [facility])   Brief Summary: 76 year old female, lives alone with her dog, PMH of HLD, HTN, polycystic kidney disease, chronic kidney disease, chronic low back pain, presented to the ED with sudden onset of difficulty speaking/difficulty finding words while she was talking to her sister on the phone on 06/02/17 at 9 AM. This lasted for approximately 8 minutes and then resolved spontaneously. She denied any other strokelike symptoms. CT head did not show acute findings. Admitted for TIA workup and management. Neurology consulted.  Assessment and plan:  1. Probable left cortical TIA: Resultant transient word finding difficulty (expressive aphasia) that resolved and has not recurred. Pins and needle sensation in the right forefoot has been chronic for the last 4 weeks without change. CT head: No acute findings. MRI head: No acute stroke. Small vessel disease. MRA head: Negative. Carotid Doppler: No significant stenosis. 2-D echo: LVEF 50-55 percent. LDL: 53. A1c: 5.7. Not on antithrombotic prior to admission. Therapies evaluation: No home recommendations. Neurology was consulted and recommended continued aspirin 325 MG  daily for secondary stroke prophylaxis, continue statins and a 30 day CardioNet monitoring to rule out A. fib as outpatient due to cortical TIA. Outpatient neurology follow-up in 6 weeks. 2. Essential hypertension: Patient states that she was taken off of antihypertensives in August 2018. Permissive hypertension for 48 hours then gradually normalize in 5-7 days. Not on antihypertensives PTA. Outpatient follow-up with PCP. 3. Hyperlipidemia: LDL 53, goal <70. Continue prior home dose of Crestor. 4. Stage III chronic kidney disease: Creatinine at baseline 1.4 and stable. Periodically follow BMP as outpatient. 5. Headache: As per neurology, patient has baseline mild daily headache and history of migraine. She reported typical migraine when she was young. Current episode cannot rule out complicated migraine. Recommend observation. 6. Right foot parasthesia: Patient reports 4 week history of pins and needles sensation in the right forefoot and some patchy sensory abnormality in the left lower lateral leg that apparently is being evaluated as outpatient.  7. Chronic low back pain: unchanged and foot symptoms don't seem to be compressive in etiology. 8. Glucose intolerance: A1c 5.7. Outpatient follow-up.   Consultations:  Neurology  Procedures:  As above   Discharge Instructions  Discharge Instructions    Ambulatory referral to Neurology    Complete by:  As directed    Follow up with stroke clinic Cecille Rubin preferred, if not available, then consider Caesar Chestnut, [facility] or Jaynee Eagles whoever is available) at [facility] in about 6-8 weeks. Thanks.   Call MD for:    Complete by:  As directed    Strokelike symptoms.   Diet - low sodium heart healthy    Complete by:  As directed    Increase activity slowly    Complete by:  As directed  Medication List    TAKE these medications   aspirin EC 325 MG tablet Take 1 tablet (325 mg total) by mouth daily.   CALCIUM 1200 PO Take 2 capsules by  mouth daily.   DULoxetine 30 MG capsule Commonly known as:  CYMBALTA Take 1 capsule (30 mg total) by mouth 2 (two) times daily.   gabapentin 300 MG capsule Commonly known as:  NEURONTIN TAKE 1 CAPSULE(300 MG) BY MOUTH TWICE DAILY What changed:  See the new instructions.   rosuvastatin 20 MG tablet Commonly known as:  CRESTOR TAKE 1 TABLET BY MOUTH DAILY What changed:  See the new instructions.   SYSTANE 0.4-0.3 % Gel ophthalmic gel Generic drug:  Polyethyl Glycol-Propyl Glycol Place 1 application into both eyes 3 (three) times daily as needed (dry eye).   zolpidem 10 MG tablet Commonly known as:  AMBIEN TAKE 1 TABLET BY MOUTH AT BEDTIME AS NEEDED FOR SLEEP What changed:  See the new instructions.      Follow-up Information    Dennie Bible, NP. Schedule an appointment as soon as possible for a visit in 6 week(s).   Specialty:  Family Medicine Contact information: [address] [place] [address] [phone]        [name], MD. Schedule an appointment as soon as possible for a visit in 1 week(s).   Specialty:  Family Medicine Why:  To be seen with repeat labs (BMP). Contact information: [place] [address] [phone]        [facility] Follow up.   Why:  Office will contact you to arrange outpatient heart monitor. Please call them if you do not hear from them in 3-4 business days. Contact information: [place] [address] [phone]         Allergies  Allergen Reactions  . Azithromycin Rash    Pruritic rash diffuse   . Celecoxib Nausea Only  . Sulfonamide Derivatives Rash      Procedures/Studies: Ct Head Wo Contrast  Result Date: 06/02/2017 CLINICAL DATA:  Episode of the aphasia lasting 8 minutes today. EXAM: CT HEAD WITHOUT CONTRAST TECHNIQUE: Contiguous axial images were obtained from the base of  the skull through the vertex without intravenous contrast. COMPARISON:  08/16/2008 MRI FINDINGS: Brain: Mild bifrontal superficial atrophy. Chronic appearing minimal small vessel ischemic disease of periventricular white matter and corona radiata. Tiny lacunar infarcts are suggested of the basal ganglia. No large vascular territory infarcts. No acute intracranial hemorrhage, midline shift or edema. Vascular: No hyperdense vessels. Mild-to-moderate atherosclerosis of the carotid siphons. Skull: Normal. Negative for fracture or focal lesion. Sinuses/Orbits: Bilateral concha bullosa of the middle turbinates. No acute sinus disease. Intact orbits and globes. Partial opacification of the mastoids. Other: None IMPRESSION: 1. No acute intracranial abnormality by CT. 2. Mild bifrontal superficial atrophy. Gyri do not have the knife like appearance typically seen in frontotemporal lobar degeneration that can be associated with primary progressive aphasia however. 3. 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 Tiny lacunar infarcts are suggested of the basal ganglia. 4. Small mastoid effusions bilaterally. Electronically Signed   By: Ashley Royalty M.D.   On: 06/02/2017 15:45   Mr Brain Wo Contrast  Result Date: 06/03/2017 CLINICAL DATA:  76 year old female with episode of aphasia yesterday. EXAM: MRI HEAD WITHOUT CONTRAST MRA HEAD WITHOUT CONTRAST TECHNIQUE: Multiplanar, multiecho pulse sequences of the brain and surrounding structures were obtained without intravenous  contrast. Angiographic images of the head were obtained using MRA technique without contrast. COMPARISON:  Head CT without contrast 06/02/2017. Brain MRI and intracranial MRA 08/16/2008 and 09/19/2008 FINDINGS: MRI HEAD FINDINGS Brain: No restricted diffusion to suggest acute infarction. No midline shift, mass effect, evidence of mass lesion, ventriculomegaly, extra-axial collection or acute intracranial hemorrhage. Cervicomedullary  junction and pituitary are within normal limits. Cerebral volume is stable and normal for age. Patchy white matter T2 and FLAIR hyperintensity about both frontal horns and the left external capsule is stable since 2010, although increased at the anterior right basal ganglia since that time. Otherwise normal gray and white matter signal. No cortical encephalomalacia. Mildly increased perivascular spaces also about the deep gray matter nuclei. Negative brainstem and cerebellum. Vascular: Major intracranial vascular flow voids are stable since 2010. Skull and upper cervical spine: Negative. Visualized bone marrow signal is within normal limits. Sinuses/Orbits: Interval postoperative changes to both globes. Otherwise stable an negative. Other: Visible internal auditory structures appear normal. Trace mastoid fluid is stable since 2010. Scalp and face soft tissues appear negative. MRA HEAD FINDINGS Mildly motion degraded. Antegrade flow in the posterior circulation and both distal vertebral arteries appears stable an normal. PICA origins remain patent. No basilar stenosis. Normal SCA and PCA origins. Posterior communicating arteries are diminutive or absent. Bilateral PCA branches are stable and within normal limits. Stable antegrade flow in the distal cervical ICAs and both ICA siphons. The right siphon is mildly dominant owing to ACA anatomy. Ophthalmic artery origins and MCA origins are normal. No siphon stenosis. Dominant right and diminutive or absent left ACA A1 segments. Anterior communicating artery and bilateral ACA branches are stable and within normal limits. Visible bilateral MCA branches are stable and within normal limits. IMPRESSION: 1.  No acute intracranial abnormality. 2. Mild for age signal changes in the cerebral white matter and right basal ganglia compatible with chronic small vessel disease. 3. Stable and negative intracranial MRA. Electronically Signed   By: Genevie Ann M.D.   On: 06/03/2017 10:31    Mr Virgel Paling EQ Contrast  Result Date: 06/03/2017 CLINICAL DATA:  76 year old female with episode of aphasia yesterday. EXAM: MRI HEAD WITHOUT CONTRAST MRA HEAD WITHOUT CONTRAST TECHNIQUE: Multiplanar, multiecho pulse sequences of the brain and surrounding structures were obtained without intravenous contrast. Angiographic images of the head were obtained using MRA technique without contrast. COMPARISON:  Head CT without contrast 06/02/2017. Brain MRI and intracranial MRA 08/16/2008 and 09/19/2008 FINDINGS: MRI HEAD FINDINGS Brain: No restricted diffusion to suggest acute infarction. No midline shift, mass effect, evidence of mass lesion, ventriculomegaly, extra-axial collection or acute intracranial hemorrhage. Cervicomedullary junction and pituitary are within normal limits. Cerebral volume is stable and normal for age. Patchy white matter T2 and FLAIR hyperintensity about both frontal horns and the left external capsule is stable since 2010, although increased at the anterior right basal ganglia since that time. Otherwise normal gray and white matter signal. No cortical encephalomalacia. Mildly increased perivascular spaces also about the deep gray matter nuclei. Negative brainstem and cerebellum. Vascular: Major intracranial vascular flow voids are stable since 2010. Skull and upper cervical spine: Negative. Visualized bone marrow signal is within normal limits. Sinuses/Orbits: Interval postoperative changes to both globes. Otherwise stable an negative. Other: Visible internal auditory structures appear normal. Trace mastoid fluid is stable since 2010. Scalp and face soft tissues appear negative. MRA HEAD FINDINGS Mildly motion degraded. Antegrade flow in the posterior circulation and both distal vertebral arteries appears stable an normal. PICA  origins remain patent. No basilar stenosis. Normal SCA and PCA origins. Posterior communicating arteries are diminutive or absent. Bilateral PCA branches are stable  and within normal limits. Stable antegrade flow in the distal cervical ICAs and both ICA siphons. The right siphon is mildly dominant owing to ACA anatomy. Ophthalmic artery origins and MCA origins are normal. No siphon stenosis. Dominant right and diminutive or absent left ACA A1 segments. Anterior communicating artery and bilateral ACA branches are stable and within normal limits. Visible bilateral MCA branches are stable and within normal limits. IMPRESSION: 1.  No acute intracranial abnormality. 2. Mild for age signal changes in the cerebral white matter and right basal ganglia compatible with chronic small vessel disease. 3. Stable and negative intracranial MRA. Electronically Signed   By: Genevie Ann M.D.   On: 06/03/2017 10:31      Subjective: Patient reports no recurrence of speech difficulty that she had yesterday morning. She denied ever having slurred speech, facial asymmetry or asymmetric limb weakness, tingling or numbness. No headache reported.  Discharge Exam:  Vitals:   06/02/17 2235 06/03/17 0030 06/03/17 0230 06/03/17 1100  BP: (!) 143/88 (!) 147/83 125/68 (!) 146/87  Pulse: 68 63 74 82  Resp: 19 18 17 20   Temp:  97.9 F (36.6 C) 97.9 F (36.6 C) 97.8 F (36.6 C)  TempSrc: Oral Oral Oral Oral  SpO2: 96% 99% 95% 100%  Weight:      Height:        General: Elderly female, moderately built and nourished, sitting up comfortably at edge of bed this morning. Cardiovascular: S1 & S2 heard, RRR, S1/S2 +. No murmurs, rubs, gallops or clicks. No JVD or pedal edema. Telemetry: Sinus rhythm. Respiratory: Clear to auscultation without wheezing, rhonchi or crackles. No increased work of breathing. Abdominal:  Non distended, non tender & soft. No organomegaly or masses appreciated. Normal bowel sounds heard. CNS: Alert and oriented. No focal deficits. Extremities: no edema, no cyanosis    The results of significant diagnostics from this hospitalization (including imaging,  microbiology, ancillary and laboratory) are listed below for reference.     Labs: CBC:  Recent Labs Lab 06/02/17 1358 06/02/17 1405  WBC 5.4  --   NEUTROABS 3.2  --   HGB 12.1 12.6  HCT 38.6 37.0  MCV 88.3  --   PLT 175  --    Basic Metabolic Panel:  Recent Labs Lab 06/02/17 1358 06/02/17 1405  NA 137 141  K 4.1 4.1  CL 107 105  CO2 25  --   GLUCOSE 92 91  BUN 19 23*  CREATININE 1.43* 1.40*  CALCIUM 9.1  --    Liver Function Tests:  Recent Labs Lab 06/02/17 1358  AST 29  ALT 19  ALKPHOS 58  BILITOT 0.6  PROT 6.2*  ALBUMIN 3.6   CBG:  Recent Labs Lab 06/02/17 1540  GLUCAP 95   Hgb A1c  Recent Labs  06/03/17 0242  HGBA1C 5.7*   Lipid Profile  Recent Labs  06/03/17 0242  CHOL 142  HDL 64  LDLCALC 53  TRIG 123  CHOLHDL 2.2      Time coordinating discharge: Over 30 minutes  SIGNED:  Vernell Leep, MD, FACP, FHM. [facility] Pager [phone]  If 7PM-7AM, please contact night-coverage [email or web address] Password [facility] 06/03/2017, 4:39 PM

## 2017-06-03 NOTE — Progress Notes (Signed)
OT Cancellation Note  Patient Details Name: Sandra Craig MRN: [id number] DOB: 11-17-1940   Cancelled Treatment:    Reason Eval/Treat Not Completed: OT screened, no needs identified, will sign off  [facility], OT/L  [phone] 06/03/2017 06/03/2017, 12:43 PM

## 2017-06-03 NOTE — Progress Notes (Signed)
Patient ready for discharge to home; discharge instructions given and reviewed; Rx sent electronically; patient discharged out all belongings returned. Patient stated understanding of instructions and to follow up with 30 day cardiac monitor.

## 2017-06-03 NOTE — Progress Notes (Signed)
  Echocardiogram 2D Echocardiogram has been performed.  Sandra Craig 06/03/2017, 11:48 AM

## 2017-06-03 NOTE — Evaluation (Signed)
Physical Therapy Evaluation & Discharge Patient Details Name: Sandra Craig MRN: [id number] DOB: 27-Jul-1941 Today's Date: 06/03/2017   History of Present Illness  Pt is a 76 y/o female admitted secondary to difficulty speaking. Neuro work up pending. 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  Clinical Impression  Pt presented sitting EOB, awake and willing to participate in therapy session. Prior to admission, pt reported that she was independent with all functional mobility and ADLs. Pt lives alone but has family that could provide assistance/supervision PRN. Pt ambulated in hallway without use of an AD with supervision for safety. She successfully completed stair training as well with no concerns. 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 No further acute PT needs identified at this time. PT signing off.    Follow Up Recommendations No PT follow up    Equipment Recommendations  None recommended by PT    Recommendations for Other Services       Precautions / Restrictions Precautions Precautions: None Restrictions Weight Bearing Restrictions: No      Mobility  Bed Mobility               General bed mobility comments: sitting EOB upon arrival  Transfers Overall transfer level: Independent Equipment used: None                Ambulation/Gait Ambulation/Gait assistance: Supervision Ambulation Distance (Feet): 300 Feet Assistive device: None Gait Pattern/deviations: Step-through pattern Gait velocity: WFL Gait velocity interpretation: at or above normal speed for age/gender General Gait Details: no instability or LOB, supervision for safety; pt able to participate in higher level balance assessment with no concerns  Stairs Stairs: Yes Stairs assistance: Supervision Stair Management: One rail Right;Alternating pattern;Forwards Number  of Stairs: 5 General stair comments: no instability or concerns, supervision for safety  Wheelchair Mobility    Modified Rankin (Stroke Patients Only)       Balance Overall balance assessment: Needs assistance Sitting-balance support: Feet supported Sitting balance-Leahy Scale: Normal     Standing balance support: During functional activity;No upper extremity supported Standing balance-Leahy Scale: Good                   Standardized Balance Assessment Standardized Balance Assessment : Dynamic Gait Index   Dynamic Gait Index Level Surface: Normal Change in Gait Speed: Normal Gait with Horizontal Head Turns: Normal Gait with Vertical Head Turns: Normal Gait and Pivot Turn: Normal Step Over Obstacle: Normal Step Around Obstacles: Normal Steps: Mild Impairment Total Score: 23       Pertinent Vitals/Pain Pain Assessment: No/denies pain    Home Living Family/patient expects to be discharged to:: Private residence Living Arrangements: Alone Available Help at Discharge: Family;Friend(s);Available PRN/intermittently Type of Home: Other(Comment) (condo) Home Access: Ramped entrance     Home Layout: One level Home Equipment: Tub bench;Cane - single point;Walker - 2 wheels      Prior Function Level of Independence: Independent               Hand Dominance   Dominant Hand: Right    Extremity/Trunk Assessment   Upper Extremity Assessment Upper Extremity Assessment: Overall WFL for tasks assessed    Lower Extremity Assessment Lower Extremity Assessment: Overall WFL for tasks assessed    Cervical / Trunk Assessment Cervical / Trunk Assessment: Normal  Communication   Communication: No difficulties  Cognition  Arousal/Alertness: Awake/alert Behavior During Therapy: WFL for tasks assessed/performed Overall Cognitive Status: Within Functional Limits for tasks assessed                                        General Comments       Exercises     Assessment/Plan    PT Assessment Patent does not need any further PT services  PT Problem List         PT Treatment Interventions      PT Goals (Current goals can be found in the Care Plan section)  Acute Rehab PT Goals Patient Stated Goal: return home    Frequency     Barriers to discharge        Co-evaluation               AM-PAC PT "6 Clicks" Daily Activity  Outcome Measure Difficulty turning over in bed (including adjusting bedclothes, sheets and blankets)?: None Difficulty moving from lying on back to sitting on the side of the bed? : None Difficulty sitting down on and standing up from a chair with arms (e.g., wheelchair, bedside commode, etc,.)?: None Help needed moving to and from a bed to chair (including a wheelchair)?: None Help needed walking in hospital room?: None Help needed climbing 3-5 steps with a railing? : None 6 Click Score: 24    End of Session   Activity Tolerance: Patient tolerated treatment well Patient left: in bed;with call bell/phone within reach Nurse Communication: Mobility status PT Visit Diagnosis: Other symptoms and signs involving the nervous system (R29.898)    Time: 5573-2202 PT Time Calculation (min) (ACUTE ONLY): 22 min   Charges:   PT Evaluation $PT Eval Moderate Complexity: 1 Mod     PT G Codes:   PT G-Codes **NOT FOR INPATIENT CLASS** Functional Assessment Tool Used: AM-PAC 6 Clicks Basic Mobility;Clinical judgement Functional Limitation: Mobility: Walking and moving around Mobility: Walking and Moving Around Current Status (R4270): 0 percent impaired, limited or restricted Mobility: Walking and Moving Around Goal Status (W2376): 0 percent impaired, limited or restricted Mobility: Walking and Moving Around Discharge Status (E8315): 0 percent impaired, limited or restricted    [facility], PT, DPT [place] 06/03/2017, 9:11 AM

## 2017-06-03 NOTE — Progress Notes (Signed)
STROKE TEAM PROGRESS NOTE   HISTORY OF PRESENT ILLNESS (per record) Sandra Craig is a 76 y.o. female was a past medical history of chronic kidney disease, fibromyalgia, hypertension, hyperlipidemia, who was in her usual state of health up until around 12 PM today, when she was on the phone with her sister, and noted that she has problems finding words and speaking.  This episode lasted a total of 8 minutes.  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 She says she also had some other neurological symptoms yesterday, around 7 PM she had numbness of her right leg and some weakness.  She went to bed around 1130 with that numbness and weakness, which had resolved, when she woke up this morning. She has not had such symptoms before.  She denied any prior strokes.  She denies any preceding illnesses including fevers chills nausea vomiting.  No headache.  No visual changes.  No lateralized numbness or weakness other than the one specified above. Currently she reports no problems.  LKW: 12 PM on 06/02/2017 tpa given?: no, NIHSS 0 Premorbid modified Rankin scale (mRS): 0   SUBJECTIVE (INTERVAL HISTORY) No family is at the bedside.  Patient came back from MRI.  No complaints, symptoms all resolved.  Recounted HPI with me, yesterday word finding difficulty episode lasting 8 minutes.  Patient does have baseline daily headache, mild, never had similar symptoms before.  She had mildly headache when she was young, which was typical migraine headache, different from current baseline daily headache.  Denies heart palpitation or racing heart.   OBJECTIVE Temp:  [97.8 F (36.6 C)-98.4 F (36.9 C)] 97.9 F (36.6 C) (11/03 0230) Pulse Rate:  [60-76] 74 (11/03 0230) Cardiac Rhythm: Normal sinus rhythm (11/03 0700) Resp:  [11-21] 17 (11/03 0230) BP: (125-168)/(68-93) 125/68 (11/03 0230) SpO2:  [95 %-99 %] 95 % (11/03 0230) Weight:  [139 lb (63  kg)] 139 lb (63 kg) (11/02 1356)  CBC:   Recent Labs Lab 06/02/17 1358 06/02/17 1405  WBC 5.4  --   NEUTROABS 3.2  --   HGB 12.1 12.6  HCT 38.6 37.0  MCV 88.3  --   PLT 175  --     Basic Metabolic Panel:   Recent Labs Lab 06/02/17 1358 06/02/17 1405  NA 137 141  K 4.1 4.1  CL 107 105  CO2 25  --   GLUCOSE 92 91  BUN 19 23*  CREATININE 1.43* 1.40*  CALCIUM 9.1  --     Lipid Panel:     Component Value Date/Time   CHOL 142 06/03/2017 0242   TRIG 123 06/03/2017 0242   TRIG 103 05/26/2006 0745   HDL 64 06/03/2017 0242   CHOLHDL 2.2 06/03/2017 0242   VLDL 25 06/03/2017 0242   LDLCALC 53 06/03/2017 0242   HgbA1c:  Lab Results  Component Value Date   HGBA1C 5.7 (H) 06/03/2017   Urine Drug Screen: No results found for: LABOPIA, COCAINSCRNUR, LABBENZ, AMPHETMU, THCU, LABBARB  Alcohol Level No results found for: [place] I have personally reviewed the radiological images below and agree with the radiology interpretations.  Ct Head Wo Contrast 06/02/2017 IMPRESSION:  1. No acute intracranial abnormality by CT 2. Mild bifrontal superficial atrophy. Gyri do not have the knife like appearance typically seen in frontotemporal lobar degeneration that can be associated with primary progressive aphasia however.  3. 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 Tiny lacunar infarcts are suggested of the basal ganglia.  4. Small mastoid effusions bilaterally.    MRI / MRA Brain / Head Wo Contrast 1.  No acute intracranial abnormality. 2. Mild for age signal changes in the cerebral white matter and right basal ganglia compatible with chronic small vessel disease. 3. Stable and negative intracranial MRA.  Transthoracic Echocardiogram - - Left ventricle: The cavity size was normal. Wall thickness was   normal. Systolic function was normal. The estimated ejection   fraction was in the range of 50% to 55%. Wall motion was normal;   there were  no regional wall motion abnormalities. Doppler   parameters are consistent with abnormal left ventricular   relaxation (grade 1 diastolic dysfunction). Impressions: - Low normal LV systolic function; mild diastolic dysfunction.   Bilateral Carotid Dopplers - No evidence of a  significant stenosis in bilateral carotid arteries. Vertebral arteries are patent with antegrade flow.   PHYSICAL EXAM Vitals:   06/02/17 2110 06/02/17 2235 06/03/17 0030 06/03/17 0230  BP: 133/70 (!) 143/88 (!) 147/83 125/68  Pulse: 76 68 63 74  Resp: 20 19 18 17   Temp: 98.4 F (36.9 C) 97.8 F (36.6 C) 97.9 F (36.6 C) 97.9 F (36.6 C)  TempSrc: Oral Oral Oral Oral  SpO2: 98% 96% 99% 95%  Weight:      Height:         ASSESSMENT/PLAN [name] is a 76 y.o. female with history of palpitations, pulmonary nodule, hypertension, hyperlipidemia, fibromyalgia, and chronic kidney disease, presenting with transient speech difficulties as well as right leg numbness and weakness. She did not receive IV t-PA due to resolution of deficits.  Probable left cortical TIA.  Differential was also including complicated migraine  Resultant word finding difficulty resolved  CT head - No acute intracranial abnormality by CT  MRI head - no acute stroke  MRA head - negative  Carotid Doppler - unremarkable  2D Echo - EF 50-55%  Recommend 30-day CardioNet monitoring to rule out A. fib as outpatient due to cortical TIA  LDL - 53  HgbA1c - 5.7  VTE prophylaxis - SCDs Diet regular Room service appropriate? Yes; Fluid consistency: Thin  No antithrombotic prior to admission, now on aspirin 325 mg daily.  Continue aspirin on discharge  Patient counseled to be compliant with her antithrombotic medications  Ongoing aggressive stroke risk factor management  Therapy recommendations: No PT follow-up recommended  Disposition: Pending  Baseline mild daily headache and history of migraine  Patient admitted  that she had baseline mild daily headache  And also history of typical migraine when she was young  Current episode cannot rule out complicated migraine at this time  Continue observation  Hypertension  Stable  Permissive hypertension (OK if < 220/120) but gradually normalize in 5-7 days  Long-term BP goal normotensive  Hyperlipidemia  Home meds:  Crestor 20 mg daily resumed in hospital  LDL 53, goal < 70  Continue statin at discharge  Other Stroke Risk Factors  Advanced age  Former cigarette smoker - quit 40 years ago  Other Active Problems  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 Please call with questions. Pt will follow up with Cecille Rubin, NP, at [facility] in about 6 weeks. Thanks for the consult.  Rosalin Hawking, MD PhD Stroke Neurology 06/03/2017 3:45 PM  To contact Stroke Continuity provider, please refer to [email or web address] After hours, contact General Neurology

## 2017-06-03 NOTE — Progress Notes (Addendum)
Preliminary results by tech - Carotid completed. No evidence of a  significant stenosis in bilateral carotid arteries. Vertebral arteries are patent with antegrade flow. Oda Cogan, BS, RDMS, RVT

## 2017-06-03 NOTE — Progress Notes (Signed)
Patients neurologic symptoms have resolved except for some tingling in her right foot, she is able to walk without assistance so it does not appear to stop her from supporting herself.

## 2017-06-05 ENCOUNTER — Telehealth: Payer: Self-pay

## 2017-06-05 ENCOUNTER — Other Ambulatory Visit: Payer: Self-pay | Admitting: Physician Assistant

## 2017-06-05 DIAGNOSIS — I639 Cerebral infarction, unspecified: Secondary | ICD-10-CM

## 2017-06-05 NOTE — Telephone Encounter (Signed)
Order not received from Card service in hospital.  Will await order to schedule.

## 2017-06-05 NOTE — Telephone Encounter (Signed)
New message    Per discharge summary from 11/2, pt is to have a heart monitor. There is no order in epic.

## 2017-06-06 ENCOUNTER — Ambulatory Visit (INDEPENDENT_AMBULATORY_CARE_PROVIDER_SITE_OTHER): Payer: Medicare Other | Admitting: Family Medicine

## 2017-06-06 ENCOUNTER — Encounter: Payer: Self-pay | Admitting: Family Medicine

## 2017-06-06 ENCOUNTER — Telehealth: Payer: Self-pay | Admitting: Physician Assistant

## 2017-06-06 VITALS — BP 132/84 | HR 77 | Temp 97.6°F | Ht 66.0 in | Wt 137.6 lb

## 2017-06-06 DIAGNOSIS — E785 Hyperlipidemia, unspecified: Secondary | ICD-10-CM

## 2017-06-06 DIAGNOSIS — I10 Essential (primary) hypertension: Secondary | ICD-10-CM

## 2017-06-06 DIAGNOSIS — Z8673 Personal history of transient ischemic attack (TIA), and cerebral infarction without residual deficits: Secondary | ICD-10-CM

## 2017-06-06 DIAGNOSIS — I639 Cerebral infarction, unspecified: Secondary | ICD-10-CM

## 2017-06-06 DIAGNOSIS — N183 Chronic kidney disease, stage 3 unspecified: Secondary | ICD-10-CM

## 2017-06-06 LAB — BASIC METABOLIC PANEL
BUN: 33 mg/dL — ABNORMAL HIGH (ref 6–23)
CO2: 33 mEq/L — ABNORMAL HIGH (ref 19–32)
Calcium: 10.6 mg/dL — ABNORMAL HIGH (ref 8.4–10.5)
Chloride: 102 mEq/L (ref 96–112)
Creatinine, Ser: 1.6 mg/dL — ABNORMAL HIGH (ref 0.40–1.20)
GFR: 33.39 mL/min — ABNORMAL LOW (ref >60.00)
Glucose, Bld: 97 mg/dL (ref 70–99)
Potassium: 4.7 mEq/L (ref 3.5–5.1)
Sodium: 141 mEq/L (ref 135–145)

## 2017-06-06 NOTE — Assessment & Plan Note (Signed)
S:  controlled on crestor 20mg  with LDL under 70 which is ideal for prior TIA. No myalgias.  Lab Results  Component Value Date   CHOL 142 06/03/2017   HDL 64 06/03/2017   LDLCALC 53 06/03/2017   TRIG 123 06/03/2017   CHOLHDL 2.2 06/03/2017   A/P: continue current medications.

## 2017-06-06 NOTE — Patient Instructions (Signed)
Please stop by lab before you go  Start aspirin 325mg   Thanks for getting monitor of heart set up  Follow up with neurology  Monitor blood pressure at home- goal at least <140/90 but under 130/80 even better with possible TIA history. I am not putting you back on medicine today as want to monitor. Some of this being slightly higher could be result of TIA/mini stroke

## 2017-06-06 NOTE — Telephone Encounter (Signed)
Left message for patient, monitor was ordered for 30 days.

## 2017-06-06 NOTE — Assessment & Plan Note (Signed)
BP was allowed to remain high in hospital for permissive hypertension- BP now appears normal BP Readings from Last 3 Encounters:  06/06/17 132/84  06/03/17 (!) 146/87  04/21/17 126/68  We will continue without medication. Near syncope on losartan 25mg  in past. Plus within 7 days of potential TIA and may trend down more

## 2017-06-06 NOTE — Assessment & Plan Note (Signed)
S: Patient was hospitalized 06/02/17 through 06/03/17 after being found to have difficulty speaking/finding words for at least 8 minutes with spontaneous resolution thought to be TIA.Possible complicated migraine with history of migraine.  Thought to have left cortical TIA- CT head without acute findings. MRI head without acute stroke but did have small vessel disease. Negative carotid doppler. Echo without thrombus and normal EF. Prediabetes with a1c 5.7 and LDL controlled at 53 on crestor. Was not on aspirin and they recommended aspirin 325 mg for secondary prevention as well as statin. They also reommended heart monitor for 30 days to rule out a fib with neurology follow up in 6 weeks.   She wanted to wait to start talking aspirin until talking to me unfortunately  A/P: Extensive eval in hospital unrevealing. Only steps remaining include 30 day cardionet monitoring and neurology follow up. Unfortunately has not started aspirin since leaving hospital and I encouraged her to start this ASAP

## 2017-06-06 NOTE — Telephone Encounter (Signed)
New Message    Is this work 2 week event monitor or 30 day?

## 2017-06-06 NOTE — Progress Notes (Signed)
Subjective:  AARIAH Craig is a 76 y.o. year old very pleasant female patient who presents for/with See problem oriented charting ROS- no recurrent trouble with speech. No chest pain, shortness of breath, arm weakness.    Past Medical History-  Patient Active Problem List   Diagnosis Date Noted  . History of transient ischemic attack (TIA) 06/02/2017    Priority: High  . 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 Chronic low back pain 12/20/2013    Priority: High  . Fibromyalgia 12/05/2007    Priority: High  . CKD (chronic kidney disease), stage III ([place]) 06/02/2017    Priority: Medium  . Near syncope 03/11/2017    Priority: Medium  . History of small bowel obstruction 06/28/2011    Priority: Medium  . GERD with stricture 06/28/2011    Priority: Medium  . Polycystic kidney disease     Priority: Medium  . Hypertension     Priority: Medium  . Hyperlipidemia     Priority: Medium  . Osteoporosis 12/21/2009    Priority: Medium  . INSOMNIA, CHRONIC 10/08/2008    Priority: Medium  . Depression 02/08/2008    Priority: Medium  . Chronic right shoulder pain 03/07/2014    Priority: Low  . Benign essential tremor 10/09/2013    Priority: Low  . Hyperglycemia 06/27/2012    Priority: Low  . Diverticulitis large intestine 02/11/2012    Priority: Low  . Irritable bowel syndrome (IBS) 06/28/2011    Priority: Low  . IC (interstitial cystitis)     Priority: Low  . Hemorrhoids 10/08/2008    Priority: Low  . 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 HEMATURIA UNSPECIFIED 07/04/2008    Priority: Low  . Lung nodules 08/09/2007    Priority: Low    Medications- reviewed and updated Current Outpatient Medications  Medication Sig Dispense Refill  . aspirin EC 325 MG tablet Take 1 tablet (325 mg total) by mouth daily. 30 tablet 0  . Calcium Carbonate-Vit D-Min (CALCIUM 1200 PO) Take 2 capsules by mouth  daily.     . DULoxetine (CYMBALTA) 30 MG capsule Take 1 capsule (30 mg total) by mouth 2 (two) times daily. 60 capsule 5  . gabapentin (NEURONTIN) 300 MG capsule TAKE 1 CAPSULE(300 MG) BY MOUTH TWICE DAILY (Patient taking differently: TAKE 1 CAPSULE(300 MG) BY mouth in the evening) 180 capsule 1  . Polyethyl Glycol-Propyl Glycol (SYSTANE) 0.4-0.3 % GEL ophthalmic gel Place 1 application into both eyes 3 (three) times daily as needed (dry eye).    . rosuvastatin (CRESTOR) 20 MG tablet TAKE 1 TABLET BY MOUTH DAILY (Patient taking differently: TAKE 20 mg TABLET BY MOUTH in the evening) 90 tablet 1  . zolpidem (AMBIEN) 10 MG tablet TAKE 1 TABLET BY MOUTH AT BEDTIME AS NEEDED FOR SLEEP (Patient taking differently: TAKE 10 mg TABLET BY MOUTH AT BEDTIME AS NEEDED FOR SLEEP) 90 tablet 1   No current facility-administered medications for this visit.     Objective: BP 132/84 (BP Location: Left Arm, Patient Position: Sitting, Cuff Size: Normal)   Pulse 77   Temp 97.6 F (36.4 C) (Oral)   Ht 5\' 6"  (1.676 m)   Wt 137 lb 9.6 oz (62.4 kg)   LMP 08/25/1992   SpO2 95%   BMI 22.21 kg/m  Gen: NAD, resting comfortably CV: RRR no murmurs rubs or gallops Lungs: CTAB no crackles, wheeze, rhonchi Abdomen: soft/nontender/nondistended/normal bowel sounds.  Ext: no edema Neuro: CN II-XII intact, sensation and reflexes normal throughout, 5/5 muscle strength in bilateral upper and lower extremities. Normal finger to nose. Normal rapid alternating movements. No pronator drift. Normal romberg. Normal gait.   Assessment/Plan:  Hypertension BP was allowed to remain high in hospital for permissive hypertension- BP now appears normal BP Readings from Last 3 Encounters:  06/06/17 132/84  06/03/17 (!) 146/87  04/21/17 126/68  We will continue without medication. Near syncope on losartan 25mg  in past. Plus within 7 days of potential TIA and may trend down more  History of transient ischemic attack (TIA) S: Patient  was hospitalized 06/02/17 through 06/03/17 after being found to have difficulty speaking/finding words for at least 8 minutes with spontaneous resolution thought to be TIA.Possible complicated migraine with history of migraine.  Thought to have left cortical TIA- CT head without acute findings. MRI head without acute stroke but did have small vessel disease. Negative carotid doppler. Echo without thrombus and normal EF. Prediabetes with a1c 5.7 and LDL controlled at 53 on crestor. Was not on aspirin and they recommended aspirin 325 mg for secondary prevention as well as statin. They also reommended heart monitor for 30 days to rule out a fib with neurology follow up in 6 weeks.   She wanted to wait to start talking aspirin until talking to me unfortunately  A/P: Extensive eval in hospital unrevealing. Only steps remaining include 30 day cardionet monitoring and neurology follow up. Unfortunately has not started aspirin since leaving hospital and I encouraged her to start this ASAP  Hyperlipidemia S:  controlled on crestor 20mg  with LDL under 70 which is ideal for prior TIA. No myalgias.  Lab Results  Component Value Date   CHOL 142 06/03/2017   HDL 64 06/03/2017   LDLCALC 53 06/03/2017   TRIG 123 06/03/2017   CHOLHDL 2.2 06/03/2017   A/P: continue current medications.    CKD (chronic kidney disease), stage III ([place]) S: likely related to polycystic kidneys- follows with nephrology. Avoiding nsaids. Trying to remain off PPI- tolerating zantac A/P: hospitalist requested updated bmet for monitoring - will update today. Suspect will be stable   Future Appointments  Date Time Provider [place]  06/20/2017  4:00 PM [id number]  07/20/2017  2:30 PM Garvin Fila, MD [facility] None   Orders Placed This Encounter  Procedures  . Basic metabolic panel    [place]   Return precautions advised.  Garret Reddish, MD

## 2017-06-06 NOTE — Assessment & Plan Note (Addendum)
S: likely related to polycystic kidneys- follows with nephrology. Avoiding nsaids. Trying to remain off PPI- tolerating zantac A/P: hospitalist requested updated bmet for monitoring - will update today. Suspect will be stable

## 2017-06-08 ENCOUNTER — Telehealth: Payer: Self-pay | Admitting: Pulmonary Disease

## 2017-06-08 NOTE — Telephone Encounter (Signed)
Spoke with pt, she states she wants Caryl Pina to correct her chart because she did receive the flu shot and the nurse from the hospital put she had it on 9/24 but she wants the correct date. BQ ordered flu shot but she states it wasn't documented.Caryl Pina do you remember giving the pt a flu shot? I expressed my apology to pt.

## 2017-06-09 ENCOUNTER — Other Ambulatory Visit: Payer: Self-pay

## 2017-06-09 ENCOUNTER — Telehealth: Payer: Self-pay | Admitting: Family Medicine

## 2017-06-09 DIAGNOSIS — I10 Essential (primary) hypertension: Secondary | ICD-10-CM

## 2017-06-09 NOTE — Telephone Encounter (Signed)
I am unable to remember a singular flu shot from 7 weeks ago. It is documented in pt chart that she was requesting flu shot, and it is documented that we were administering the flu shot. Spoke with pt who states she received a flu shot. This has been appropriately documented at pt request. I apologized for any inconvenience.   Nothing further needed.

## 2017-06-09 NOTE — Telephone Encounter (Signed)
Patient returning phone call about blood work. Patient states she saw results, and would like to do repeat blood work.  Patient also states she has an issue with another doctors office giving her a flu shot and not charting it. Asking for return phone call when possible.

## 2017-06-09 NOTE — Telephone Encounter (Signed)
Will route this to [facility] to have her return the call to the pt. Pt only wants to speak with her to make sure this is done correctly.

## 2017-06-09 NOTE — Telephone Encounter (Signed)
Patient is calling requesting Sandra Craig call her at [phone] to make sure this is taken care of.

## 2017-06-09 NOTE — Telephone Encounter (Signed)
Spoke with patient and scheduled her for repeat lab work next week. I entered in her orders. She confirmed Flu Vaccine date.

## 2017-06-13 NOTE — Telephone Encounter (Signed)
° ° ° °  This need to be close please

## 2017-06-14 ENCOUNTER — Other Ambulatory Visit (INDEPENDENT_AMBULATORY_CARE_PROVIDER_SITE_OTHER): Payer: Medicare Other

## 2017-06-14 DIAGNOSIS — I10 Essential (primary) hypertension: Secondary | ICD-10-CM

## 2017-06-14 LAB — BASIC METABOLIC PANEL
BUN: 27 mg/dL — ABNORMAL HIGH (ref 6–23)
CO2: 30 mEq/L (ref 19–32)
Calcium: 9.3 mg/dL (ref 8.4–10.5)
Chloride: 105 mEq/L (ref 96–112)
Creatinine, Ser: 1.49 mg/dL — ABNORMAL HIGH (ref 0.40–1.20)
GFR: 36.25 mL/min — ABNORMAL LOW (ref >60.00)
Glucose, Bld: 95 mg/dL (ref 70–99)
Potassium: 4.4 mEq/L (ref 3.5–5.1)
Sodium: 141 mEq/L (ref 135–145)

## 2017-06-15 ENCOUNTER — Other Ambulatory Visit: Payer: Self-pay | Admitting: Family Medicine

## 2017-06-21 ENCOUNTER — Telehealth: Payer: Self-pay

## 2017-06-21 NOTE — Telephone Encounter (Signed)
PA for Ambien has been initiated through Cover My Meds.  Awaiting insurance decision.

## 2017-06-27 NOTE — Telephone Encounter (Signed)
PA for Ambien has been approved.  Patient's pharmacy has been notified.

## 2017-06-28 ENCOUNTER — Ambulatory Visit (INDEPENDENT_AMBULATORY_CARE_PROVIDER_SITE_OTHER): Payer: Medicare Other

## 2017-06-28 ENCOUNTER — Other Ambulatory Visit: Payer: Self-pay | Admitting: Physician Assistant

## 2017-06-28 DIAGNOSIS — I4891 Unspecified atrial fibrillation: Secondary | ICD-10-CM

## 2017-06-28 DIAGNOSIS — G459 Transient cerebral ischemic attack, unspecified: Secondary | ICD-10-CM

## 2017-06-28 DIAGNOSIS — I639 Cerebral infarction, unspecified: Secondary | ICD-10-CM

## 2017-07-06 ENCOUNTER — Telehealth: Payer: Self-pay

## 2017-07-06 NOTE — Telephone Encounter (Signed)
Prior Authorization approval received for zolpidem (AMBIEN) 10 MG tablet. Authorization valid until June 21, 2018

## 2017-07-20 ENCOUNTER — Ambulatory Visit (INDEPENDENT_AMBULATORY_CARE_PROVIDER_SITE_OTHER): Payer: Medicare Other | Admitting: Neurology

## 2017-07-20 ENCOUNTER — Encounter: Payer: Self-pay | Admitting: Neurology

## 2017-07-20 VITALS — BP 142/80 | HR 80 | Ht 66.0 in | Wt 140.2 lb

## 2017-07-20 DIAGNOSIS — R4701 Aphasia: Secondary | ICD-10-CM

## 2017-07-20 DIAGNOSIS — I639 Cerebral infarction, unspecified: Secondary | ICD-10-CM | POA: Diagnosis not present

## 2017-07-20 DIAGNOSIS — G459 Transient cerebral ischemic attack, unspecified: Secondary | ICD-10-CM

## 2017-07-20 NOTE — Progress Notes (Signed)
[facility] [address] [place]. [place] [address] [phone]       OFFICE CONSULT NOTE  Sandra Craig Date of Birth:  1941-02-03 Medical Record Number:  [id number]   Reason for Referral:  TIA  HPI: Sandra Craig is a 76 year old female who is being seen for follow up from hospital TIA on 06/02/2017, PMH of HLD, HTN, polycystic kidney disease, chronic kidney disease, chronic low back pain, presented to the ED on 06/02/2017 with sudden onset of difficulty speaking/difficulty finding words while she was talking to her sister on the phone on 06/02/17 at 9 AM. She stated she knew the words she wanted to say but could not get them out. This lasted for approximately 8 minutes and then resolved spontaneously. She denied any other strokelike symptoms such as weakness or numbness. CT head did not show acute findings. MRI showed no acute stroke but did show small vessel diease. MRA negative. Patient started on Aspirin 325mg  for secondary stroke prevention. Previously taking crestor prior to TIA in which she will continue. LDL controlled at 53. Transthoracic echo was normal. Currently not on HTN medications. BP at today's visit at 142/80 which per patient is higher then normal for her.  Patient reports episode in August 2018 where she "passed out". Her PCP d/c'd her BP medications and since this time has not had any other syncopal events.  Since the TIA, patient has not had any other stroke/TIA symptoms. Denies speech/language difficulties. She overall feels well. Continues to eat a healthy diet and exercise. Currently wearing 30 day CardioNet monitoring to rule out A. Fib. She states her mother had a sudden cardiac death in her 78s. Patient states she was diagnosed with possible A. fib years ago but on inquiry she states "and "irregular heart beat. She is unable to give any specific details as to whether she had workup done at that time are not   ROS:   14 system review of systems  is positive for hearing loss, ringing in ears, and cramps. All other systems negative.  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   Chronic insomnia    Cystitis    Diverticulitis of intestine without perforation or abscess without bleeding    Patient did have abscess but noperforation   Diverticulosis of colon (without mention of hemorrhage)    Endometriosis    Family history of malignant neoplasm of gastrointestinal tract    Fibromyalgia    Gastritis    Hiatal hernia    HIATAL HERNIA 10/08/2008   Qualifier: Diagnosis of  By: Nils Pyle CMA (AAMA), Mearl Latin     History of gallstones    Hyperlipidemia    Hypertension    IBS (irritable bowel syndrome)    IC (interstitial cystitis)    Internal hemorrhoid    Osteonecrosis (HCC)    Osteopenia    Osteoporosis    Palpitations    Polycystic kidney disease    Pulmonary nodule 12/08   5 mm Anterior RUL   Small bowel obstruction (HCC)     Social History:  Social History   Socioeconomic History   Marital status: Widowed    Spouse name: Not on file   Number of children: Not on file   Years of education: Not on file   Highest education level: Not on file  Social Needs   Financial resource strain: Not on file   Food insecurity - worry: Not on file  Food insecurity - inability: Not on file   Transportation needs - medical: Not on file   Transportation needs - non-medical: Not on file  Occupational History   Occupation: Retired    Comment: Manufacturing engineer  Tobacco Use   Smoking status: Former Smoker    Packs/day: 0.75    Years: 8.00    Pack years: 6.00    Types: Cigarettes    Last attempt to quit: 08/01/1976    Years since quitting: 40.9   Smokeless tobacco: Never Used   Tobacco comment: Quit in 1978  Substance and Sexual Activity   Alcohol use: No    Alcohol/week: 0.0 oz   Drug use: No   Sexual activity: No  Other Topics  Concern   Not on file  Social History Narrative   Not on file    Medications:   Current Outpatient Medications on File Prior to Visit  Medication Sig Dispense Refill   aspirin EC 325 MG tablet Take 1 tablet (325 mg total) by mouth daily. 30 tablet 0   Calcium Carbonate-Vit D-Min (CALCIUM 1200 PO) Take 2 capsules by mouth daily.      DULoxetine (CYMBALTA) 30 MG capsule Take 1 capsule (30 mg total) by mouth 2 (two) times daily. 60 capsule 5   gabapentin (NEURONTIN) 300 MG capsule TAKE 1 CAPSULE(300 MG) BY MOUTH TWICE DAILY (Patient taking differently: TAKE 1 CAPSULE(300 MG) BY mouth in the evening) 180 capsule 1   Polyethyl Glycol-Propyl Glycol (SYSTANE) 0.4-0.3 % GEL ophthalmic gel Place 1 application into both eyes 3 (three) times daily as needed (dry eye).     rosuvastatin (CRESTOR) 20 MG tablet TAKE 1 TABLET BY MOUTH DAILY 90 tablet 0   zolpidem (AMBIEN) 10 MG tablet TAKE 1 TABLET BY MOUTH AT BEDTIME AS NEEDED FOR SLEEP 90 tablet 0   No current facility-administered medications on file prior to visit.     Allergies:   Allergies  Allergen Reactions   Azithromycin Rash    Pruritic rash diffuse    Celecoxib Nausea Only   Sulfonamide Derivatives Rash    Physical Exam General: well developed, well nourished elderly Caucasian lady, seated, in no evident distress Head: head normocephalic and atraumatic.   Neck: supple with no carotid or supraclavicular bruits Cardiovascular: regular rate and rhythm, no murmurs Musculoskeletal: no deformity Skin:  no rash/petichiae Vascular:  Normal pulses all extremities  Neurologic Exam Mental Status: Awake and fully alert. Oriented to place and time. Recent and remote memory intact. Attention span, concentration and fund of knowledge appropriate. Mood and affect appropriate.  Cranial Nerves: Fundoscopic exam reveals sharp disc margins. Pupils equal, briskly reactive to light. Extraocular movements full without nystagmus. Visual fields  full to confrontation. Hearing intact. Facial sensation intact. Face, tongue, palate moves normally and symmetrically.  Motor: Normal bulk and tone. Normal strength in all tested extremity muscles. Sensory.: intact to touch , pinprick , position and vibratory sensation.  Coordination: Rapid alternating movements normal in all extremities. Finger-to-nose and heel-to-shin performed accurately bilaterally. Gait and Station: Arises from chair without difficulty. Stance is normal. Gait demonstrates normal stride length and balance . Able to heel, toe and tandem walk without difficulty.  Reflexes: 2+ and symmetric. Toes downgoing.   NIHSS  0 Modified Rankin  0   ASSESSMENT: Ms Prentiss is a 76 year old Caucasian female who presented with expressive aphasia in Nov 2018 diagnosed with TIA as MRI and CT scan negative. PMH HLD and HTN. Vascular risk factors currently controlled. LDL  53 on Crestor. BP remains stable despite blood pressure medications. Remote history of syncope and cardiac arrhythmia make possibility of paroxysmal A. fib likely.   PLAN: I had a long d/w patient about her recent TIA, risk for recurrent stroke/TIAs including high blood pressure and increased LDL. I personally independently reviewed imaging studies and stroke evaluation results and answered questions.Continue aspirin 325 mg daily  for secondary stroke prevention and maintain strict control of hypertension with blood pressure goal below 130/90, diabetes with hemoglobin A1c goal below 6.5% and lipids with LDL cholesterol goal below 70 mg/dL. I also advised the patient to eat a healthy diet with plenty of whole grains, cereals, fruits and vegetables, exercise regularly and maintain ideal body weight.  Please schedule EEG for rule out of potential seizure activity. If 30 day heart monitor is negative for A. fib I recommend doing an implantable loop recorder for long-term monitoring for A. Fib. Greater than 50% time during this 25  visit was spent on counseling and coordination of care about a TIA and discussion about stroke prevention In the future Followup in the future with Janett Billow, NP in 6 months or earlier if needed.  Antony Contras, MD  [facility] [address] [place], [place] [address]  Phone [phone] Fax [phone] Note: This document was prepared with digital dictation and possible smart phrase technology. Any transcriptional errors that result from this process are unintentional.

## 2017-07-20 NOTE — Patient Instructions (Addendum)
I had a long d/w patient about her recent TIA, risk for recurrent stroke/TIAs including high blood pressure and increased LDL. I personally independently reviewed imaging studies and stroke evaluation results and answered questions.Continue aspirin 325 mg daily  for secondary stroke prevention and maintain strict control of hypertension with blood pressure goal below 130/90, diabetes with hemoglobin A1c goal below 6.5% and lipids with LDL cholesterol goal below 70 mg/dL. I also advised the patient to eat a healthy diet with plenty of whole grains, cereals, fruits and vegetables, exercise regularly and maintain ideal body weight. Please schedule EEG for rule out of seizure activity. If 30 day heart monitor is negative for A. fib I recommend doing an implantable loop recorder for long-term monitoring for A. fib in the future Followup in the future with Janett Billow, NP in 6 months or earlier if needed.   Stroke Prevention Some health problems and behaviors may make it more likely for you to have a stroke. Below are ways to lessen your risk of having a stroke.  Be active for at least 30 minutes on most or all days.  Do not smoke. Try not to be around others who smoke.  Do not drink too much alcohol. ? Do not have more than 2 drinks a day if you are a man. ? Do not have more than 1 drink a day if you are a woman and are not pregnant.  Eat healthy foods, such as fruits and vegetables. If you were put on a specific diet, follow the diet as told.  Keep your cholesterol levels under control through diet and medicines. Look for foods that are low in saturated fat, trans fat, cholesterol, and are high in fiber.  If you have diabetes, follow all diet plans and take your medicine as told.  Ask your doctor if you need treatment to lower your blood pressure. If you have high blood pressure (hypertension), follow all diet plans and take your medicine as told by your doctor.  If you are 77-47 years old, have your  blood pressure checked every 3-5 years. If you are age 37 or older, have your blood pressure checked every year.  Keep a healthy weight. Eat foods that are low in calories, salt, saturated fat, trans fat, and cholesterol.  Do not take drugs.  Avoid birth control pills, if this applies. Talk to your doctor about the risks of taking birth control pills.  Talk to your doctor if you have sleep problems (sleep apnea).  Take all medicine as told by your doctor. ? You may be told to take aspirin or blood thinner medicine. Take this medicine as told by your doctor. ? Understand your medicine instructions.  Make sure any other conditions you have are being taken care of.  Get help right away if:  You suddenly lose feeling (you feel numb) or have weakness in your face, arm, or leg.  Your face or eyelid hangs down to one side.  You suddenly feel confused.  You have trouble talking (aphasia) or understanding what people are saying.  You suddenly have trouble seeing in one or both eyes.  You suddenly have trouble walking.  You are dizzy.  You lose your balance or your movements are clumsy (uncoordinated).  You suddenly have a very bad headache and you do not know the cause.  You have new chest pain.  Your heart feels like it is fluttering or skipping a beat (irregular heartbeat). Do not wait to see if the symptoms above  go away. Get help right away. Call your local emergency services (911 in [place] Do not drive yourself to the hospital. This information is not intended to replace advice given to you by your health care provider. Make sure you discuss any questions you have with your health care provider. Document Released: 01/17/2012 Document Revised: 12/24/2015 Document Reviewed: 01/18/2013 Elsevier Interactive Patient Education  [employer].

## 2017-08-03 ENCOUNTER — Encounter: Payer: Self-pay | Admitting: Family Medicine

## 2017-08-04 ENCOUNTER — Other Ambulatory Visit: Payer: Self-pay

## 2017-08-04 MED ORDER — GABAPENTIN 300 MG PO CAPS: ORAL_CAPSULE | ORAL | 1 refills | Status: DC

## 2017-08-10 NOTE — Telephone Encounter (Signed)
Copied from [place] [id number]. Topic: Quick Communication - Rx Refill/Question >> Aug 10, 2017  5:05 PM Sandra Craig wrote: Medication:  DULoxetine (CYMBALTA) 30 MG capsule  Has the patient contacted their pharmacy? Yes.    (Agent: If no, request that the patient contact the pharmacy for the refill.)  Preferred Pharmacy (with phone number or street name):   [name] [address], [place] - [address] [place] [address] Phone: [phone] Fax: [phone]    Agent: Please be advised that RX refills may take up to 3 business days. We ask that you follow-up with your pharmacy.

## 2017-08-14 ENCOUNTER — Other Ambulatory Visit: Payer: Medicare Other

## 2017-08-15 ENCOUNTER — Ambulatory Visit (INDEPENDENT_AMBULATORY_CARE_PROVIDER_SITE_OTHER): Payer: Medicare Other | Admitting: Neurology

## 2017-08-15 DIAGNOSIS — G459 Transient cerebral ischemic attack, unspecified: Secondary | ICD-10-CM

## 2017-08-15 DIAGNOSIS — R4701 Aphasia: Secondary | ICD-10-CM

## 2017-08-18 ENCOUNTER — Telehealth: Payer: Self-pay

## 2017-08-18 NOTE — Telephone Encounter (Signed)
Notes recorded by Marval Regal, RN on 08/18/2017 at 12:04 PM EST Rn call patient that the EEG was normal. Pt verbalized understanding. ------

## 2017-08-18 NOTE — Telephone Encounter (Signed)
-----   Message from Garvin Fila, MD sent at 08/18/2017 11:33 AM EST ----- Kindly inform the patient that EEG study was normal

## 2017-08-22 ENCOUNTER — Telehealth: Payer: Self-pay

## 2017-08-22 NOTE — Telephone Encounter (Signed)
-----   Message from Rosalin Hawking, MD sent at 08/21/2017  5:40 PM EST ----- Could you please let the patient know that the heart monitoring test result showed no irregular heart beat for 30 days. No further action needed this time. Please continue current treatment. Thanks.  Rosalin Hawking, MD PhD Stroke Neurology 08/21/2017 5:40 PM

## 2017-08-22 NOTE — Telephone Encounter (Signed)
Rn receive call about pt wanting cardiac monitor results. The result showed no irregular heart beat for 30 days. No further action needed at this time. Continue treatment plan. Pt verbalized understanding. ------  Notes recorded by Marval Regal, RN on 08/22/2017 at 2:13 PM EST Left vm for patient to call back about cardiac monitor results. ------

## 2017-09-04 ENCOUNTER — Encounter: Payer: Self-pay | Admitting: Family Medicine

## 2017-09-06 ENCOUNTER — Other Ambulatory Visit: Payer: Self-pay

## 2017-09-06 MED ORDER — DULOXETINE HCL 30 MG PO CPEP: 30.0000 mg | ORAL_CAPSULE | Freq: Two times a day (BID) | ORAL | 5 refills | Status: DC

## 2017-09-06 MED ORDER — DULOXETINE HCL 30 MG PO CPEP: 30.0000 mg | ORAL_CAPSULE | Freq: Two times a day (BID) | ORAL | 1 refills | Status: DC

## 2017-09-13 ENCOUNTER — Other Ambulatory Visit: Payer: Self-pay | Admitting: Family Medicine

## 2017-09-15 ENCOUNTER — Other Ambulatory Visit: Payer: Self-pay | Admitting: Family Medicine

## 2017-09-15 MED ORDER — ZOLPIDEM TARTRATE 10 MG PO TABS: 10.0000 mg | ORAL_TABLET | Freq: Every evening | ORAL | 1 refills | Status: DC | PRN

## 2017-09-21 DIAGNOSIS — D1801 Hemangioma of skin and subcutaneous tissue: Secondary | ICD-10-CM | POA: Diagnosis not present

## 2017-09-21 DIAGNOSIS — L821 Other seborrheic keratosis: Secondary | ICD-10-CM | POA: Diagnosis not present

## 2017-09-21 DIAGNOSIS — L820 Inflamed seborrheic keratosis: Secondary | ICD-10-CM | POA: Diagnosis not present

## 2017-09-21 DIAGNOSIS — D2272 Melanocytic nevi of left lower limb, including hip: Secondary | ICD-10-CM | POA: Diagnosis not present

## 2017-09-21 DIAGNOSIS — D225 Melanocytic nevi of trunk: Secondary | ICD-10-CM | POA: Diagnosis not present

## 2017-09-21 DIAGNOSIS — Z85828 Personal history of other malignant neoplasm of skin: Secondary | ICD-10-CM | POA: Diagnosis not present

## 2017-09-21 DIAGNOSIS — L814 Other melanin hyperpigmentation: Secondary | ICD-10-CM | POA: Diagnosis not present

## 2017-10-04 ENCOUNTER — Encounter: Payer: Self-pay | Admitting: Podiatry

## 2017-10-04 ENCOUNTER — Ambulatory Visit (INDEPENDENT_AMBULATORY_CARE_PROVIDER_SITE_OTHER): Payer: Medicare Other | Admitting: Podiatry

## 2017-10-04 ENCOUNTER — Ambulatory Visit (INDEPENDENT_AMBULATORY_CARE_PROVIDER_SITE_OTHER): Payer: Medicare Other

## 2017-10-04 VITALS — Temp 96.6°F

## 2017-10-04 DIAGNOSIS — M21619 Bunion of unspecified foot: Secondary | ICD-10-CM | POA: Diagnosis not present

## 2017-10-04 DIAGNOSIS — L03031 Cellulitis of right toe: Secondary | ICD-10-CM

## 2017-10-04 DIAGNOSIS — Q828 Other specified congenital malformations of skin: Secondary | ICD-10-CM | POA: Diagnosis not present

## 2017-10-04 MED ORDER — CEPHALEXIN 500 MG PO CAPS: 500.0000 mg | ORAL_CAPSULE | Freq: Three times a day (TID) | ORAL | 0 refills | Status: DC

## 2017-10-04 NOTE — Progress Notes (Signed)
Subjective:    Patient ID: Sandra Craig, female    DOB: 1941/06/23, 77 y.o.   MRN: [id number]  HPI 77 year old female presents the office today initially for concerns of calluses and warts to both of her feet however she states that she had a callus in her right big toe and it turned into a blister and she started to notice redness around the toe and the toe is been very sore and red and been getting worse.  She has noticed that there is been some redness in the top of her toe which started today.  She denies any red streaks up her foot.  No recent injury or trauma.  She denies any drainage or pus.  Overall she feels well and she denies any systemic complaints including fevers, chills, nausea, vomiting.  No calf pain, chest pain, shortness of breath.   Review of Systems  All other systems reviewed and are negative.  Past Medical History:  Diagnosis Date  . 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 Chronic insomnia   . Cystitis   . Diverticulitis of intestine without perforation or abscess without bleeding    Patient did have abscess but noperforation  . Diverticulosis of colon (without mention of hemorrhage)   . Endometriosis   . Family history of malignant neoplasm of gastrointestinal tract   . Fibromyalgia   . Gastritis   . Hiatal hernia   . HIATAL HERNIA 10/08/2008   Qualifier: Diagnosis of  By: Nils Pyle CMA ([place]), Mearl Latin    . History of gallstones   . Hyperlipidemia   . Hypertension   . IBS (irritable bowel syndrome)   . IC (interstitial cystitis)   . Internal hemorrhoid   . Osteonecrosis ([place])   . Osteopenia   . Osteoporosis   . Palpitations   . Polycystic kidney disease   . Pulmonary nodule 12/08   5 mm Anterior RUL  . Small bowel obstruction [facility])     Past Surgical History:  Procedure Laterality Date  . ABDOMINAL HYSTERECTOMY  1994   TAH,BSO FOR ENDOMETRIOSIS  . ABDOMINAL SURGERY  2011   small intestine blockage  . CHOLECYSTECTOMY    .  GASTROPLASTY  2011   small bowel resection -open  . JOINT REPLACEMENT  2008  . OOPHORECTOMY  1994   TAH,BSO  . PELVIC LAPAROSCOPY    . S/P right shoulder rotater cuff  200216/2011   Tear/adhesive capsulitis  . SBO Lap  11   Adhesions and small internal hernia  . SHOULDER HEMI-ARTHROPLASTY  06/05/2012   Procedure: SHOULDER HEMI-ARTHROPLASTY;  Surgeon: Johnny Bridge, MD;  Location: [facility];  Service: Orthopedics;  Laterality: Right;  FOR ARTHRITIS  . TOTAL HIP ARTHROPLASTY  FALL OF 2008   rt. partial hip replacement  . TOTAL SHOULDER ARTHROPLASTY  06/05/2012   Procedure: TOTAL SHOULDER ARTHROPLASTY;  Surgeon: Johnny Bridge, MD;  Location: [place];  Service: Orthopedics;  Laterality: Right;  RIGHT SHOULDER TOTAL ARTHROPLASTY, HEMIARTHROPLASTY, SHOULDER, FOR ARTHRITIS  . VIDEO BRONCHOSCOPY Bilateral 01/28/2015   Procedure: VIDEO BRONCHOSCOPY WITH FLUORO;  Surgeon: Collene Gobble, MD;  Location: [facility];  Service: Cardiopulmonary;  Laterality: Bilateral;     Current Outpatient Medications:  .  aspirin EC 325 MG tablet, Take 1 tablet (325 mg total) by mouth daily., Disp: 30 tablet, Rfl: 0 .  Calcium Carbonate-Vit D-Min (CALCIUM 1200 PO), Take 2 capsules by mouth daily. , Disp: , Rfl:  .  cephALEXin (KEFLEX) 500 MG capsule, Take 1  capsule (500 mg total) by mouth 3 (three) times daily., Disp: 30 capsule, Rfl: 0 .  DULoxetine (CYMBALTA) 30 MG capsule, Take 1 capsule (30 mg total) by mouth 2 (two) times daily., Disp: 180 capsule, Rfl: 1 .  gabapentin (NEURONTIN) 300 MG capsule, TAKE 1 CAPSULE(300 MG) BY MOUTH TWICE DAILY, Disp: 180 capsule, Rfl: 1 .  Polyethyl Glycol-Propyl Glycol (SYSTANE) 0.4-0.3 % GEL ophthalmic gel, Place 1 application into both eyes 3 (three) times daily as needed (dry eye)., Disp: , Rfl:  .  rosuvastatin (CRESTOR) 20 MG tablet, TAKE 1 TABLET BY MOUTH DAILY, Disp: 90 tablet, Rfl: 3 .  zolpidem (AMBIEN) 10 MG tablet, Take 1 tablet (10 mg total) by mouth at bedtime as needed.  for sleep, Disp: 90 tablet, Rfl: 1  Allergies  Allergen Reactions  . Azithromycin Rash    Pruritic rash diffuse   . Celecoxib Nausea Only  . Sulfonamide Derivatives Rash    Social History   Socioeconomic History  . Marital status: Widowed    Spouse name: Not on file  . Number of children: Not on file  . Years of education: Not on file  . Highest education level: Not on file  Social Needs  . Financial resource strain: Not on file  . Food insecurity - worry: Not on file  . Food insecurity - inability: Not on file  . Transportation needs - medical: Not on file  . Transportation needs - non-medical: Not on file  Occupational History  . Occupation: Retired    Comment: Manufacturing engineer  Tobacco Use  . Smoking status: Former Smoker    Packs/day: 0.75    Years: 8.00    Pack years: 6.00    Types: Cigarettes    Last attempt to quit: 08/01/1976    Years since quitting: 41.2  . Smokeless tobacco: Never Used  . Tobacco comment: Quit in 1978  Substance and Sexual Activity  . Alcohol use: No    Alcohol/week: 0.0 oz  . Drug use: No  . Sexual activity: No  Other Topics Concern  . Not on file  Social History Narrative  . Not on file        Objective:   Physical Exam  General: AAO x3, NAD  Dermatological: Multiple hyperkeratotic lesions present bilateral submetatarsal area.  Her main concern that was on the right hallux there is evidence of old J and there is surrounding erythema to the medial hallux IPJ and extending dorsally to the first MPJ but there is no ascending cellulitis otherwise.  There is no fluctuation or crepitation but there is mild swelling.  Mild increase in warmth.  There is no drainage or pus or any open sores.  Vascular: Dorsalis Pedis artery and Posterior Tibial artery pedal pulses are 2/4 bilateral with immedate capillary fill time. There is no pain with calf compression, swelling, warmth, erythema.   Neruologic: Grossly intact via light touch bilateral.   Protective threshold with Semmes Wienstein monofilament intact to all pedal sites bilateral.   Musculoskeletal: Severe HAV present b/l. Hammertoes present bilateral. Prominent metatarsal heads b/l. Tenderness to medial hallux. No other areas of tenderness.  Muscular strength 5/5 in all groups tested bilateral.  Gait: Unassisted, Nonantalgic.     Assessment & Plan:  77 year old female with cellulitis right foot -Treatment options discussed including all alternatives, risks, and complications -Etiology of symptoms were discussed -X-rays were obtained and reviewed with the patient. Severe HAV present. No evidence of acute fracture, osteomyelitis or soft tissue emphysema.  -  Prescribed keflex 500mg  TID x 10 days -Surgical shoe for offloading -Monitor for any clinical signs or symptoms of infection and directed to call the office immediately should any occur or go to the ER. -RTC 1 week or sooner if needed. Will trim calluses next appointment once infection improved.   Trula Slade DPM

## 2017-10-12 ENCOUNTER — Ambulatory Visit (INDEPENDENT_AMBULATORY_CARE_PROVIDER_SITE_OTHER): Payer: Medicare Other | Admitting: Podiatry

## 2017-10-12 ENCOUNTER — Encounter: Payer: Self-pay | Admitting: Podiatry

## 2017-10-12 DIAGNOSIS — Q828 Other specified congenital malformations of skin: Secondary | ICD-10-CM | POA: Diagnosis not present

## 2017-10-12 DIAGNOSIS — L03031 Cellulitis of right toe: Secondary | ICD-10-CM | POA: Diagnosis not present

## 2017-10-16 NOTE — Progress Notes (Signed)
Subjective: 77 year old female presents the office today for follow-up evaluation of cellulitis of the right foot.  She states that she is on antibiotics and she has significant improvement in her symptoms.  She states that there is no longer any pain and she denies any open sores or blistering.  She denies any drainage or pus or any swelling to her foot.  Her main concern today is the calluses to both of her feet that are painful with pressure in shoes.  She has no other concerns today. Denies any systemic complaints such as fevers, chills, nausea, vomiting. No acute changes since last appointment, and no other complaints at this time.   Objective: AAO x3, NAD DP/PT pulses palpable bilaterally, CRT less than 3 seconds HAV is present.  There is still some faint erythema on the medial hallux on the IPJ over the area where there is a previous blister but this is substantially improved.  There is no increase in warmth, drainage or pus or ascending cellulitis.  No fluctuation or crepitation.  There is no malodor.  Thick hyperkeratotic lesion right foot submetatarsal 3 area as well as medial first MTPJ on the right side as well as left submetatarsal area.  No underlying ulceration drainage or signs of infection. No open lesions or pre-ulcerative lesions.  No pain with calf compression, swelling, warmth, erythema  Assessment: Hyperkeratotic lesions bilaterally with improved cellulitis  Plan: -All treatment options discussed with the patient including all alternatives, risks, complications.  -Initial course of antibiotics.  Monitor for any recurrence.  Infection is much improved -Sharpy debrided the hyperkeratotic lesions x3 without any complications or bleeding -Patient encouraged to call the office with any questions, concerns, change in symptoms.   Trula Slade DPM

## 2017-12-13 ENCOUNTER — Encounter: Payer: Self-pay | Admitting: Family Medicine

## 2017-12-29 ENCOUNTER — Ambulatory Visit: Payer: Medicare Other | Admitting: Family Medicine

## 2018-01-11 ENCOUNTER — Ambulatory Visit (INDEPENDENT_AMBULATORY_CARE_PROVIDER_SITE_OTHER): Payer: Medicare Other | Admitting: Family Medicine

## 2018-01-11 ENCOUNTER — Encounter: Payer: Self-pay | Admitting: Family Medicine

## 2018-01-11 VITALS — BP 112/72 | HR 88 | Temp 98.1°F | Ht 66.0 in | Wt 136.0 lb

## 2018-01-11 DIAGNOSIS — M797 Fibromyalgia: Secondary | ICD-10-CM | POA: Diagnosis not present

## 2018-01-11 DIAGNOSIS — N183 Chronic kidney disease, stage 3 unspecified: Secondary | ICD-10-CM

## 2018-01-11 DIAGNOSIS — F325 Major depressive disorder, single episode, in full remission: Secondary | ICD-10-CM

## 2018-01-11 DIAGNOSIS — E785 Hyperlipidemia, unspecified: Secondary | ICD-10-CM

## 2018-01-11 DIAGNOSIS — I10 Essential (primary) hypertension: Secondary | ICD-10-CM | POA: Diagnosis not present

## 2018-01-11 DIAGNOSIS — Z1231 Encounter for screening mammogram for malignant neoplasm of breast: Secondary | ICD-10-CM | POA: Diagnosis not present

## 2018-01-11 DIAGNOSIS — Z8673 Personal history of transient ischemic attack (TIA), and cerebral infarction without residual deficits: Secondary | ICD-10-CM | POA: Diagnosis not present

## 2018-01-11 LAB — HM MAMMOGRAPHY

## 2018-01-11 NOTE — Assessment & Plan Note (Signed)
Doing VERY well on Gabapentin has helped- now once a day, cymbalta 30mg - minimal pain

## 2018-01-11 NOTE — Progress Notes (Signed)
Subjective:  Sandra Craig is a 77 y.o. year old very pleasant female patient who presents for/with See problem oriented charting ROS- No chest pain or shortness of breath. No headache or blurry vision.    Past Medical History-  Patient Active Problem List   Diagnosis Date Noted  . History of transient ischemic attack (TIA) 06/02/2017    Priority: High  . 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 Chronic low back pain 12/20/2013    Priority: High  . Fibromyalgia 12/05/2007    Priority: High  . CKD (chronic kidney disease), stage III ([place]) 06/02/2017    Priority: Medium  . Near syncope 03/11/2017    Priority: Medium  . History of small bowel obstruction 06/28/2011    Priority: Medium  . GERD with stricture 06/28/2011    Priority: Medium  . Polycystic kidney disease     Priority: Medium  . Hypertension     Priority: Medium  . Hyperlipidemia     Priority: Medium  . Osteoporosis 12/21/2009    Priority: Medium  . INSOMNIA, CHRONIC 10/08/2008    Priority: Medium  . Depression 02/08/2008    Priority: Medium  . Chronic right shoulder pain 03/07/2014    Priority: Low  . Benign essential tremor 10/09/2013    Priority: Low  . Hyperglycemia 06/27/2012    Priority: Low  . Diverticulitis large intestine 02/11/2012    Priority: Low  . Irritable bowel syndrome (IBS) 06/28/2011    Priority: Low  . IC (interstitial cystitis)     Priority: Low  . Hemorrhoids 10/08/2008    Priority: Low  . 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 HEMATURIA UNSPECIFIED 07/04/2008    Priority: Low  . Lung nodules 08/09/2007    Priority: Low    Medications- reviewed and updated Current Outpatient Medications  Medication Sig Dispense Refill  . DULoxetine (CYMBALTA) 30 MG capsule Take 1 capsule (30 mg total) by mouth 2 (two) times daily. 180 capsule 1  . gabapentin (NEURONTIN) 300 MG capsule TAKE 1 CAPSULE(300  MG) BY MOUTH TWICE DAILY 180 capsule 1  . Polyethyl Glycol-Propyl Glycol (SYSTANE) 0.4-0.3 % GEL ophthalmic gel Place 1 application into both eyes 3 (three) times daily as needed (dry eye).    . rosuvastatin (CRESTOR) 20 MG tablet TAKE 1 TABLET BY MOUTH DAILY 90 tablet 3  . zolpidem (AMBIEN) 10 MG tablet Take 1 tablet (10 mg total) by mouth at bedtime as needed. for sleep 90 tablet 1   Objective: BP 112/72 (BP Location: Left Arm, Patient Position: Sitting, Cuff Size: Normal)   Pulse 88   Temp 98.1 F (36.7 C) (Oral)   Ht 5\' 6"  (1.676 m)   Wt 136 lb (61.7 kg)   LMP 08/25/1992   SpO2 94%   BMI 21.95 kg/m  Gen: NAD, resting comfortably CV: RRR no murmurs rubs or gallops Lungs: CTAB no crackles, wheeze, rhonchi Abdomen: soft/nontender/nondistended/normal bowel sounds. No rebound or guarding.  Ext: no edema Skin: warm, dry  Assessment/Plan:  Other notes: 1.in [facility] [employer] clinical trial for hearing- church is better, theatre better, conversations better with improved hearing    Hypertension S: controlled on no rx at present. On losartan 25mg  she had near syncope- fortunately BP ok without meds at this time. hasnt checked at home lately- had dizziness like once with stooping down and getting up BP Readings from Last 3 Encounters:  01/11/18 112/72  07/20/17 Marland Kitchen)  142/80  06/06/17 132/84  A/P: We discussed blood pressure goal of <140/90. Continue without meds.   History of transient ischemic attack (TIA) S: History of TIA-at last visit patient was supposed to finish work-up with 30-day cardiac monitor and neurology follow-up.  She is compliant with aspirin at this time at 325 mg A/P: has upper abdominal discomfort with this due to GERD- we are going to try zantac 150mg  BID to see if she can tolerate it. She will ask neurology if she cant if she can go to 81mg   Hyperlipidemia S:  controlled on Crestor 20 mg with LDL under 70 on last check in November A/P: Continue current  medications  CKD (chronic kidney disease), stage III ([place]) S: Patient with known polycystic kidneys and follows with nephrology in the past.  She knows to avoid NSAIDs.  We will try to keep her off proton pump inhibitor-does okay with Zantac.  Filtration rate in mid 30s on last check-mild improvement with increased hydration from week prior in November A/P: will update GFR- as long as remains over 30 will continue to monitor with risk factor modification.    Depression Major depression- single episode- full remission.  Remains on cymbalta 30mg . PHQ9 of 0 today- doing really well- thinks hearing aids helping her enjoy life more  Fibromyalgia Doing VERY well on Gabapentin has helped- now once a day, cymbalta 30mg - minimal pain   Future Appointments  Date Time Provider [place]  01/18/2018  2:15 PM Venancio Poisson, NP [facility] None   6 months advised. She is going to be out of town at time of neuro visit- advised her to call asap  Lab/Order associations: Essential hypertension - Plan: Basic metabolic panel  Hyperlipidemia, unspecified hyperlipidemia type - Plan: LDL cholesterol, direct  CKD (chronic kidney disease), stage III ([place]) - Plan: Basic metabolic panel  Return precautions advised.  Garret Reddish, MD

## 2018-01-11 NOTE — Patient Instructions (Addendum)
Health Maintenance Due  Topic Date Due  . MAMMOGRAM - Patient had it done today, our team will a wait the results from [place].  12/28/2017   No changes today other than trying zantac 150mg  BID  Please stop by lab before you go

## 2018-01-11 NOTE — Assessment & Plan Note (Signed)
S: History of TIA-at last visit patient was supposed to finish work-up with 30-day cardiac monitor and neurology follow-up.  She is compliant with aspirin at this time at 325 mg A/P: has upper abdominal discomfort with this due to GERD- we are going to try zantac 150mg  BID to see if she can tolerate it. She will ask neurology if she cant if she can go to 81mg 

## 2018-01-11 NOTE — Assessment & Plan Note (Addendum)
Major depression- single episode- full remission.  Remains on cymbalta 30mg . PHQ9 of 0 today- doing really well- thinks hearing aids helping her enjoy life more

## 2018-01-11 NOTE — Assessment & Plan Note (Signed)
S: controlled on no rx at present. On losartan 25mg  she had near syncope- fortunately BP ok without meds at this time. hasnt checked at home lately- had dizziness like once with stooping down and getting up BP Readings from Last 3 Encounters:  01/11/18 112/72  07/20/17 (!) 142/80  06/06/17 132/84  A/P: We discussed blood pressure goal of <140/90. Continue without meds.

## 2018-01-11 NOTE — Assessment & Plan Note (Signed)
S: Patient with known polycystic kidneys and follows with nephrology in the past.  She knows to avoid NSAIDs.  We will try to keep her off proton pump inhibitor-does okay with Zantac.  Filtration rate in mid 30s on last check-mild improvement with increased hydration from week prior in November A/P: will update GFR- as long as remains over 30 will continue to monitor with risk factor modification.

## 2018-01-11 NOTE — Assessment & Plan Note (Signed)
S:  controlled on Crestor 20 mg with LDL under 70 on last check in November A/P: Continue current medications

## 2018-01-12 LAB — BASIC METABOLIC PANEL
BUN: 27 mg/dL — ABNORMAL HIGH (ref 6–23)
CO2: 28 mEq/L (ref 19–32)
Calcium: 9.4 mg/dL (ref 8.4–10.5)
Chloride: 104 mEq/L (ref 96–112)
Creatinine, Ser: 1.53 mg/dL — ABNORMAL HIGH (ref 0.40–1.20)
GFR: 35.1 mL/min — ABNORMAL LOW (ref >60.00)
Glucose, Bld: 133 mg/dL — ABNORMAL HIGH (ref 70–99)
Potassium: 3.9 mEq/L (ref 3.5–5.1)
Sodium: 142 mEq/L (ref 135–145)

## 2018-01-12 LAB — LDL CHOLESTEROL, DIRECT: Direct LDL: 73 mg/dL

## 2018-01-18 ENCOUNTER — Ambulatory Visit: Payer: Medicare Other | Admitting: Adult Health

## 2018-01-21 ENCOUNTER — Encounter: Payer: Self-pay | Admitting: Family Medicine

## 2018-01-23 ENCOUNTER — Ambulatory Visit (INDEPENDENT_AMBULATORY_CARE_PROVIDER_SITE_OTHER): Payer: Medicare Other | Admitting: Adult Health

## 2018-01-23 ENCOUNTER — Encounter: Payer: Self-pay | Admitting: Adult Health

## 2018-01-23 VITALS — BP 102/65 | HR 83 | Ht 66.0 in | Wt 133.0 lb

## 2018-01-23 DIAGNOSIS — Z8673 Personal history of transient ischemic attack (TIA), and cerebral infarction without residual deficits: Secondary | ICD-10-CM | POA: Diagnosis not present

## 2018-01-23 DIAGNOSIS — E785 Hyperlipidemia, unspecified: Secondary | ICD-10-CM

## 2018-01-23 DIAGNOSIS — I10 Essential (primary) hypertension: Secondary | ICD-10-CM | POA: Diagnosis not present

## 2018-01-23 MED ORDER — ASPIRIN 81 MG PO TABS: 81.0000 mg | ORAL_TABLET | Freq: Every day | ORAL | Status: AC

## 2018-01-23 NOTE — Progress Notes (Signed)
[facility] [address] [place]. [place] [address] [phone]       OFFICE CONSULT NOTE  Ms. KAIDEN DARDIS Date of Birth:  28-Dec-1940 Medical Record Number:  [id number]   Reason for Referral:  TIA  HPI: Sandra Craig is a 77 year old female who is being seen for follow up from hospital TIA on 06/02/2017, PMH of HLD, HTN, polycystic kidney disease, chronic kidney disease, chronic low back pain, presented to the ED on 06/02/2017 with sudden onset of difficulty speaking/difficulty finding words while she was talking to her sister on the phone on 06/02/17 at 9 AM. She stated she knew the words she wanted to say but could not get them out. This lasted for approximately 8 minutes and then resolved spontaneously. She denied any other strokelike symptoms such as weakness or numbness. CT head did not show acute findings. MRI showed no acute stroke but did show small vessel diease. MRA negative. Patient started on Aspirin 325mg  for secondary stroke prevention. Previously taking crestor prior to TIA in which she will continue. LDL controlled at 53. Transthoracic echo was normal. Currently not on HTN medications. BP at today's visit at 142/80 which per patient is higher then normal for her. Patient reports episode in August 2018 where she "passed out". Her PCP d/c'd her BP medications and since this time has not had any other syncopal events.   07/20/17 visit Dr. Leonie Man: Since the TIA, patient has not had any other stroke/TIA symptoms. Denies speech/language difficulties. She overall feels well. Continues to eat a healthy diet and exercise. Currently wearing 30 day CardioNet monitoring to rule out A. Fib. She states her mother had a sudden cardiac death in her 65s. Patient states she was diagnosed with possible A. fib years ago but on inquiry she states "and "irregular heart beat. She is unable to give any specific details as to whether she had workup done at that time are not  01/23/18  UPDATE: Patient returns today for six-month follow-up.  Approximately 4 weeks ago, she stopped aspirin EC 325 mg due to worsening GERD despite changes and GERD medications.  Approximately 3 weeks ago, she did have an episode where she was unable to see her left eye in the near and worsened 1 to try to focus on it.  This lasted approximately 30 minutes and then resolved.  Patient denies history of the symptoms.  Denies headache at that time, history of headaches or migraines along with family history of migraines.  Patient questioning whether she can be started on aspirin 81 mg as she is unable to tolerate aspirin 325 despite use of aspirin due to fact thatEC.  Aspirin was stopped approximately 4 weeks ago with an episode occurred 3 weeks ago and patient is reluctant to start Plavix, is agreed upon to start aspirin 81 mg but patient is aware that if any of the symptoms do recur again Plavix will be started at that time.  Also advised patient importance of calling 911 with these symptoms along with notifying us if she is unable to tolerate medication prior to stopping.  Blood pressure mildly low at 102/65.  Patient did undergo 30-day cardiac monitor which was negative for atrial fibrillation.  At previous appointment, is recommended that if monitor negative to have loop recorder placed but this did not happen.  Patient states she is agreeable to this if needed but has not heard anything since that time.  Patient continues to do all previous activities without complications.  No further  concerns or questions at this time.   ROS:   14 system review of systems is positive for no complaints all other systems negative.  PMH:  Past Medical History:  Diagnosis Date  . 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 Chronic insomnia   . Cystitis   . Diverticulitis of intestine without perforation or abscess without bleeding    Patient did have abscess but noperforation  . Diverticulosis of colon (without  mention of hemorrhage)   . Endometriosis   . Family history of malignant neoplasm of gastrointestinal tract   . Fibromyalgia   . Gastritis   . Hiatal hernia   . HIATAL HERNIA 10/08/2008   Qualifier: Diagnosis of  By: Nils Pyle CMA ([place]), Mearl Latin    . History of gallstones   . Hyperlipidemia   . Hypertension   . IBS (irritable bowel syndrome)   . IC (interstitial cystitis)   . Internal hemorrhoid   . Osteonecrosis ([place])   . Osteopenia   . Osteoporosis   . Palpitations   . Polycystic kidney disease   . Pulmonary nodule 12/08   5 mm Anterior RUL  . Small bowel obstruction ([place])     Social History:  Social History   Socioeconomic History  . Marital status: Widowed    Spouse name: Not on file  . Number of children: Not on file  . Years of education: Not on file  . Highest education level: Not on file  Occupational History  . Occupation: Retired    Comment: Manufacturing engineer  Social Needs  . Financial resource strain: Not on file  . Food insecurity:    Worry: Not on file    Inability: Not on file  . Transportation needs:    Medical: Not on file    Non-medical: Not on file  Tobacco Use  . Smoking status: Former Smoker    Packs/day: 0.75    Years: 8.00    Pack years: 6.00    Types: Cigarettes    Last attempt to quit: 08/01/1976    Years since quitting: 41.5  . Smokeless tobacco: Never Used  . Tobacco comment: Quit in 1978  Substance and Sexual Activity  . Alcohol use: No    Alcohol/week: 0.0 oz  . Drug use: No  . Sexual activity: Never  Lifestyle  . Physical activity:    Days per week: Not on file    Minutes per session: Not on file  . Stress: Not on file  Relationships  . Social connections:    Talks on phone: Not on file    Gets together: Not on file    Attends religious service: Not on file    Active member of club or organization: Not on file    Attends meetings of clubs or organizations: Not on file    Relationship status: Not on file  . Intimate  partner violence:    Fear of current or ex partner: Not on file    Emotionally abused: Not on file    Physically abused: Not on file    Forced sexual activity: Not on file  Other Topics Concern  . Not on file  Social History Narrative  . Not on file    Medications:   Current Outpatient Medications on File Prior to Visit  Medication Sig Dispense Refill  . DULoxetine (CYMBALTA) 30 MG capsule Take 1 capsule (30 mg total) by mouth 2 (two) times daily. 180 capsule 1  . gabapentin (NEURONTIN) 300 MG capsule TAKE  1 CAPSULE(300 MG) BY MOUTH TWICE DAILY 180 capsule 1  . Polyethyl Glycol-Propyl Glycol (SYSTANE) 0.4-0.3 % GEL ophthalmic gel Place 1 application into both eyes 3 (three) times daily as needed (dry eye).    . rosuvastatin (CRESTOR) 20 MG tablet TAKE 1 TABLET BY MOUTH DAILY 90 tablet 3  . zolpidem (AMBIEN) 10 MG tablet Take 1 tablet (10 mg total) by mouth at bedtime as needed. for sleep 90 tablet 1   No current facility-administered medications on file prior to visit.     Allergies:   Allergies  Allergen Reactions  . Azithromycin Rash    Pruritic rash diffuse   . Celecoxib Nausea Only  . Sulfonamide Derivatives Rash    Physical Exam General: well developed, well nourished pleasant elderly Caucasian lady, seated, in no evident distress Head: head normocephalic and atraumatic.   Neck: supple with no carotid or supraclavicular bruits Cardiovascular: regular rate and rhythm, no murmurs Musculoskeletal: no deformity Skin:  no rash/petichiae Vascular:  Normal pulses all extremities  Neurologic Exam Mental Status: Awake and fully alert. Oriented to place and time. Recent and remote memory intact. Attention span, concentration and fund of knowledge appropriate. Mood and affect appropriate.  Cranial Nerves: Fundoscopic exam reveals sharp disc margins. Pupils equal, briskly reactive to light. Extraocular movements full without nystagmus. Visual fields full to confrontation. Hearing  intact. Facial sensation intact. Face, tongue, palate moves normally and symmetrically.  Motor: Normal bulk and tone. Normal strength in all tested extremity muscles. Sensory.: intact to touch , pinprick , position and vibratory sensation.  Coordination: Rapid alternating movements normal in all extremities. Finger-to-nose and heel-to-shin performed accurately bilaterally. Gait and Station: Arises from chair without difficulty. Stance is normal. Gait demonstrates normal stride length and balance . Able to heel, toe and tandem walk without difficulty.  Reflexes: 2+ and symmetric. Toes downgoing.     IMAGING:  EEG ADULT 08/15/2017 Summary Normal electroencephalogram, awake, asleep and with activation procedures. There are no focal lateralizing or epileptiform features.  [place] Negative for irregular heartbeat, arrhythmias or atrial fibrillation     ASSESSMENT: Ms Clouatre is a 77 year old Caucasian female who presented with expressive aphasia in Nov 2018 diagnosed with TIA as MRI and CT scan negative. PMH HLD and HTN. Vascular risk factors currently controlled. LDL 53 on Crestor. BP remains stable despite blood pressure medications. Remote history of syncope and cardiac arrhythmia make possibility of paroxysmal A. fib likely.  Patient returns today for follow-up visit.   PLAN: -Stop aspirin 325 and start aspirin 81 mg and continue Crestor for secondary stroke prevention -f/u with PCP regarding HLD and HTN management -Advised patient importance of calling 911 with symptoms of stroke and patient did voice understanding along with notifying us or her PCP prior to discontinuing any medications on her own -We will follow-up with Dr. Willaim Rayas in regards to possible need of loop recorder as a 30-day cardiac monitor negative -Continue to stay active and eat healthy -Maintain strict control of hypertension with blood pressure goal below 130/90, diabetes with hemoglobin A1c goal below  6.5% and cholesterol with LDL cholesterol (bad cholesterol) goal below 70 mg/dL. I also advised the patient to eat a healthy diet with plenty of whole grains, cereals, fruits and vegetables, exercise regularly and maintain ideal body weight.  Followup in the future with me in 6 months or call earlier if needed   Greater than 50% of time during this 25 minute visit was spent on counseling,explanation of diagnosis  of TIA, reviewing risk factor management of HLD and HTN, planning of further management, discussion with patient and family and coordination of care  Venancio Poisson, [facility]  [facility] [address] [place], [place] [address]  Phone [phone] Fax [phone] Note: This document was prepared with digital dictation and possible smart phrase technology. Any transcriptional errors that result from this process are unintentional.

## 2018-01-23 NOTE — Patient Instructions (Addendum)
Stop aspirin 325 mg daily and start aspirin 81mg  due to stomach issues and crestor  for secondary stroke prevention  Continue to follow up with PCP regarding choelsterol and blood pressure management   You will with any concerns of medication effects prior to stopping them  Important to call 911 if you have symptoms of stroke!!  Continue to monitor blood pressure at home  I will call you after speaking with dr. Leonie Man regarding loop recorder placement  Maintain strict control of hypertension with blood pressure goal below 130/90, diabetes with hemoglobin A1c goal below 6.5% and cholesterol with LDL cholesterol (bad cholesterol) goal below 70 mg/dL. I also advised the patient to eat a healthy diet with plenty of whole grains, cereals, fruits and vegetables, exercise regularly and maintain ideal body weight.  Followup in the future with me in 6 months or call earlier if needed         Thank you for coming to see [place] at [facility]. I hope we have been able to provide you high quality care today.  You may receive a patient satisfaction survey over the next few weeks. We would appreciate your feedback and comments so that we may continue to improve ourselves and the health of our patients.

## 2018-01-25 NOTE — Progress Notes (Signed)
I agree with the above plan 

## 2018-01-29 ENCOUNTER — Telehealth: Payer: Self-pay | Admitting: Family Medicine

## 2018-01-29 NOTE — Telephone Encounter (Signed)
error 

## 2018-01-30 ENCOUNTER — Encounter: Payer: Self-pay | Admitting: Family Medicine

## 2018-01-30 ENCOUNTER — Ambulatory Visit (INDEPENDENT_AMBULATORY_CARE_PROVIDER_SITE_OTHER): Payer: Medicare Other | Admitting: Family Medicine

## 2018-01-30 VITALS — BP 112/62 | HR 81 | Temp 98.1°F | Ht 66.0 in | Wt 132.6 lb

## 2018-01-30 DIAGNOSIS — R300 Dysuria: Secondary | ICD-10-CM | POA: Diagnosis not present

## 2018-01-30 DIAGNOSIS — R82998 Other abnormal findings in urine: Secondary | ICD-10-CM

## 2018-01-30 DIAGNOSIS — N3000 Acute cystitis without hematuria: Secondary | ICD-10-CM

## 2018-01-30 DIAGNOSIS — N183 Chronic kidney disease, stage 3 unspecified: Secondary | ICD-10-CM

## 2018-01-30 LAB — POCT URINALYSIS DIPSTICK
Bilirubin, UA: NEGATIVE
Glucose, UA: NEGATIVE
Ketones, UA: NEGATIVE
Nitrite, UA: NEGATIVE
Protein, UA: NEGATIVE
Spec Grav, UA: 1.01 (ref 1.010–1.025)
Urobilinogen, UA: 0.2 E.U./dL
pH, UA: 5.5 (ref 5.0–8.0)

## 2018-01-30 MED ORDER — CEPHALEXIN 500 MG PO CAPS: 500.0000 mg | ORAL_CAPSULE | Freq: Two times a day (BID) | ORAL | 0 refills | Status: DC

## 2018-01-30 MED ORDER — CEPHALEXIN 500 MG PO CAPS: 500.0000 mg | ORAL_CAPSULE | Freq: Three times a day (TID) | ORAL | 0 refills | Status: DC

## 2018-01-30 NOTE — Patient Instructions (Addendum)
I would also like for you to sign up for an annual wellness visit with one of our nurses, Cassie or Manuela Schwartz, who both specialize in the annual wellness visit. This is a free benefit under medicare that may help [place] find additional ways to help you. Some highlights are reviewing medications, lifestyle, and doing a dementia screen.  Please check with your pharmacy to see if they have the shingrix vaccine. If they do- please get this immunization and update [place] by phone call or mychart with dates you receive the vaccine ________________________________________________________   possible UTI. Will get urine culture. Empiric treatment with: keflex twice a day Patient to follow up if new or worsening symptoms or failure to improve.

## 2018-01-30 NOTE — Progress Notes (Signed)
Subjective:  Sandra Craig is a 77 y.o. year old very pleasant female patient who presents for/with See problem oriented charting ROS- see ros embedded below   Past Medical History-  Patient Active Problem List   Diagnosis Date Noted  . History of transient ischemic attack (TIA) 06/02/2017    Priority: High  . 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 Chronic low back pain 12/20/2013    Priority: High  . Fibromyalgia 12/05/2007    Priority: High  . CKD (chronic kidney disease), stage III ([place]) 06/02/2017    Priority: Medium  . Near syncope 03/11/2017    Priority: Medium  . History of small bowel obstruction 06/28/2011    Priority: Medium  . GERD with stricture 06/28/2011    Priority: Medium  . Polycystic kidney disease     Priority: Medium  . Hypertension     Priority: Medium  . Hyperlipidemia     Priority: Medium  . Osteoporosis 12/21/2009    Priority: Medium  . INSOMNIA, CHRONIC 10/08/2008    Priority: Medium  . Depression 02/08/2008    Priority: Medium  . Chronic right shoulder pain 03/07/2014    Priority: Low  . Benign essential tremor 10/09/2013    Priority: Low  . Hyperglycemia 06/27/2012    Priority: Low  . Diverticulitis large intestine 02/11/2012    Priority: Low  . Irritable bowel syndrome (IBS) 06/28/2011    Priority: Low  . IC (interstitial cystitis)     Priority: Low  . Hemorrhoids 10/08/2008    Priority: Low  . 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 HEMATURIA UNSPECIFIED 07/04/2008    Priority: Low  . Lung nodules 08/09/2007    Priority: Low    Medications- reviewed and updated Current Outpatient Medications  Medication Sig Dispense Refill  . aspirin 81 MG tablet Take 1 tablet (81 mg total) by mouth daily. 30 tablet   . DULoxetine (CYMBALTA) 30 MG capsule Take 1 capsule (30 mg total) by mouth 2 (two) times daily. 180 capsule 1  . gabapentin (NEURONTIN)  300 MG capsule TAKE 1 CAPSULE(300 MG) BY MOUTH TWICE DAILY 180 capsule 1  . Polyethyl Glycol-Propyl Glycol (SYSTANE) 0.4-0.3 % GEL ophthalmic gel Place 1 application into both eyes 3 (three) times daily as needed (dry eye).    . ranitidine (ZANTAC) 150 MG tablet Take 150 mg by mouth 2 (two) times daily.    . rosuvastatin (CRESTOR) 20 MG tablet TAKE 1 TABLET BY MOUTH DAILY 90 tablet 3  . zolpidem (AMBIEN) 10 MG tablet Take 1 tablet (10 mg total) by mouth at bedtime as needed. for sleep 90 tablet 1   No current facility-administered medications for this visit.     Objective: BP 112/62 (BP Location: Left Arm, Patient Position: Sitting, Cuff Size: Normal)   Pulse 81   Temp 98.1 F (36.7 C) (Oral)   Ht 5\' 6"  (1.676 m)   Wt 132 lb 9.6 oz (60.1 kg)   LMP 08/25/1992   SpO2 95%   BMI 21.40 kg/m  Gen: NAD, resting comfortably CV: RRR no murmurs rubs or gallops Lungs: CTAB no crackles, wheeze, rhonchi Abdomen: soft/suprapubic tenderness noted/nondistended/normal bowel sounds Ext: no edema Skin: warm, dry Results for orders placed or performed in visit on 01/30/18 (from the past 24 hour(s))  POCT urinalysis dipstick     Status: Abnormal   Collection Time: 01/30/18  3:03 PM  Result Value Ref Range  Color, UA Yellow    Clarity, UA Hazy    Glucose, UA Negative Negative   Bilirubin, UA Negative    Ketones, UA Negative    Spec Grav, UA 1.010 1.010 - 1.025   Blood, UA Large    pH, UA 5.5 5.0 - 8.0   Protein, UA Negative Negative   Urobilinogen, UA 0.2 0.2 or 1.0 E.U./dL   Nitrite, UA Negative    Leukocytes, UA Moderate (2+) (A) Negative   Appearance     Odor     Assessment/Plan:  Concern for UTI in patient with CKD III S: Patients symptoms started 2 days ago.  Complains of dysuria: yes described as burning and also with suprapubic pain; polyuria: yes; nocturia: just once a night; urgency: yes.  Symptoms are stable since starting. Thought about starting azo but hasnt. No pain medicine  like tylenol  ROS- no fever, chills, nausea, vomiting. some flank pain but no new pain in this area. No blood in urine.  A/P: UA with leukocytes, possible UTI. Will get urine culture. Empiric treatment with: keflex twice a day. Originally planned on TID but reviewed GFR in 30s with CKD III and changed to BID dosing with max daily dose of 1000mg  per day of medication.  Patient to follow up if new or worsening symptoms or failure to improve.   Future Appointments  Date Time Provider [place]  08/06/2018  1:45 PM Venancio Poisson, NP [facility] None   Lab/Order associations: Dysuria - Plan: POCT urinalysis dipstick  Return precautions advised.  Garret Reddish, MD

## 2018-02-01 LAB — URINE CULTURE
MICRO NUMBER:: 90787423
SPECIMEN QUALITY:: ADEQUATE

## 2018-02-02 ENCOUNTER — Encounter: Payer: Self-pay | Admitting: Family Medicine

## 2018-02-04 ENCOUNTER — Encounter: Payer: Self-pay | Admitting: Family Medicine

## 2018-02-05 ENCOUNTER — Other Ambulatory Visit: Payer: Self-pay

## 2018-02-05 MED ORDER — ROSUVASTATIN CALCIUM 20 MG PO TABS: 20.0000 mg | ORAL_TABLET | Freq: Every day | ORAL | 0 refills | Status: DC

## 2018-02-05 MED ORDER — GABAPENTIN 300 MG PO CAPS: ORAL_CAPSULE | ORAL | 0 refills | Status: DC

## 2018-02-05 MED ORDER — DULOXETINE HCL 30 MG PO CPEP: 30.0000 mg | ORAL_CAPSULE | Freq: Two times a day (BID) | ORAL | 0 refills | Status: DC

## 2018-02-05 MED ORDER — CEPHALEXIN 500 MG PO CAPS: 500.0000 mg | ORAL_CAPSULE | Freq: Two times a day (BID) | ORAL | 0 refills | Status: DC

## 2018-02-05 NOTE — Telephone Encounter (Signed)
Patient calling to speak to clinical team regarding reasons why the Ambien can not be refilled. I read the note Dr. Yong Channel last put however, she would like a call back. Call patient at [phone]

## 2018-02-05 NOTE — Telephone Encounter (Signed)
Patient called to check on the status of her request and to alert Dr. Yong Channel that she's had diarrhea and vomiting.

## 2018-02-09 ENCOUNTER — Other Ambulatory Visit: Payer: Self-pay | Admitting: Family Medicine

## 2018-02-24 ENCOUNTER — Ambulatory Visit (INDEPENDENT_AMBULATORY_CARE_PROVIDER_SITE_OTHER): Payer: Medicare Other | Admitting: Family Medicine

## 2018-02-24 ENCOUNTER — Encounter: Payer: Self-pay | Admitting: Family Medicine

## 2018-02-24 VITALS — BP 130/82 | HR 75 | Temp 97.7°F | Ht 66.0 in | Wt 134.0 lb

## 2018-02-24 DIAGNOSIS — R300 Dysuria: Secondary | ICD-10-CM

## 2018-02-24 LAB — POC URINALSYSI DIPSTICK (AUTOMATED)
Glucose, UA: POSITIVE — AB
Protein, UA: POSITIVE — AB
Spec Grav, UA: >=1.03 — AB (ref 1.010–1.025)
Urobilinogen, UA: 2 E.U./dL — AB
pH, UA: 6 (ref 5.0–8.0)

## 2018-02-24 MED ORDER — CEPHALEXIN 500 MG PO CAPS: 500.0000 mg | ORAL_CAPSULE | Freq: Two times a day (BID) | ORAL | 0 refills | Status: AC

## 2018-02-24 NOTE — Assessment & Plan Note (Signed)
Likely cystitis. Nontoxic. Keflex, PO fluids, f/u prn. She agrees.

## 2018-02-24 NOTE — Patient Instructions (Signed)
Drink plenty of water and start the antibiotics today. Follow up as needed. Take care.

## 2018-02-24 NOTE — Progress Notes (Signed)
H/o UCx pos UTI earlier this month.  She had gotten better and resolved. Then returned 2 days ago  Dysuria: yes, burning, urgency duration of symptoms: 2 days abdominal pain: lower abd pain Fevers: no back pain: at baseline from back- old issue Vomiting: no Started AZO yesterday with relief.  U/a d/w pt. Not enough urine to culture.    H/o IC noted. D/w pt.    Meds, vitals, and allergies reviewed.   Per HPI unless specifically indicated in ROS section   GEN: nad, alert and oriented HEENT: mucous membranes moist NECK: supple CV: rrr.  PULM: ctab, no inc wob ABD: soft, +bs, suprapubic area slightly tender EXT: no edema SKIN: well perfused.  BACK: no CVA pain

## 2018-03-15 ENCOUNTER — Other Ambulatory Visit: Payer: Self-pay

## 2018-03-15 ENCOUNTER — Encounter: Payer: Self-pay | Admitting: Family Medicine

## 2018-03-15 MED ORDER — DULOXETINE HCL 30 MG PO CPEP: 30.0000 mg | ORAL_CAPSULE | Freq: Two times a day (BID) | ORAL | 1 refills | Status: DC

## 2018-03-15 MED ORDER — ZOLPIDEM TARTRATE 10 MG PO TABS: 10.0000 mg | ORAL_TABLET | Freq: Every evening | ORAL | 1 refills | Status: DC | PRN

## 2018-03-21 ENCOUNTER — Encounter: Payer: Medicare Other | Admitting: Women's Health

## 2018-04-25 DIAGNOSIS — M5416 Radiculopathy, lumbar region: Secondary | ICD-10-CM | POA: Diagnosis not present

## 2018-04-25 DIAGNOSIS — M48061 Spinal stenosis, lumbar region without neurogenic claudication: Secondary | ICD-10-CM | POA: Diagnosis not present

## 2018-04-25 DIAGNOSIS — M47817 Spondylosis without myelopathy or radiculopathy, lumbosacral region: Secondary | ICD-10-CM | POA: Diagnosis not present

## 2018-04-25 DIAGNOSIS — M545 Low back pain: Secondary | ICD-10-CM | POA: Diagnosis not present

## 2018-04-27 DIAGNOSIS — Z23 Encounter for immunization: Secondary | ICD-10-CM | POA: Diagnosis not present

## 2018-04-30 DIAGNOSIS — M25511 Pain in right shoulder: Secondary | ICD-10-CM | POA: Diagnosis not present

## 2018-05-01 ENCOUNTER — Other Ambulatory Visit: Payer: Self-pay | Admitting: Family Medicine

## 2018-05-03 ENCOUNTER — Other Ambulatory Visit: Payer: Self-pay

## 2018-05-03 MED ORDER — DULOXETINE HCL 30 MG PO CPEP: 30.0000 mg | ORAL_CAPSULE | Freq: Two times a day (BID) | ORAL | 1 refills | Status: DC

## 2018-05-07 ENCOUNTER — Encounter: Payer: Self-pay | Admitting: Women's Health

## 2018-05-07 ENCOUNTER — Ambulatory Visit (INDEPENDENT_AMBULATORY_CARE_PROVIDER_SITE_OTHER): Payer: Medicare Other | Admitting: Women's Health

## 2018-05-07 VITALS — BP 120/74 | Ht 66.0 in | Wt 135.0 lb

## 2018-05-07 DIAGNOSIS — M810 Age-related osteoporosis without current pathological fracture: Secondary | ICD-10-CM

## 2018-05-07 DIAGNOSIS — E2839 Other primary ovarian failure: Secondary | ICD-10-CM

## 2018-05-07 DIAGNOSIS — Z01419 Encounter for gynecological examination (general) (routine) without abnormal findings: Secondary | ICD-10-CM | POA: Diagnosis not present

## 2018-05-07 NOTE — Patient Instructions (Signed)
shingrex  2 series Health Maintenance for Postmenopausal Women Menopause is a normal process in which your reproductive ability comes to an end. This process happens gradually over a span of months to years, usually between the ages of 52 and 69. Menopause is complete when you have missed 12 consecutive menstrual periods. It is important to talk with your health care provider about some of the most common conditions that affect postmenopausal women, such as heart disease, cancer, and bone loss (osteoporosis). Adopting a healthy lifestyle and getting preventive care can help to promote your health and wellness. Those actions can also lower your chances of developing some of these common conditions. What should I know about menopause? During menopause, you may experience a number of symptoms, such as:  Moderate-to-severe hot flashes.  Night sweats.  Decrease in sex drive.  Mood swings.  Headaches.  Tiredness.  Irritability.  Memory problems.  Insomnia.  Choosing to treat or not to treat menopausal changes is an individual decision that you make with your health care provider. What should I know about hormone replacement therapy and supplements? Hormone therapy products are effective for treating symptoms that are associated with menopause, such as hot flashes and night sweats. Hormone replacement carries certain risks, especially as you become older. If you are thinking about using estrogen or estrogen with progestin treatments, discuss the benefits and risks with your health care provider. What should I know about heart disease and stroke? Heart disease, heart attack, and stroke become more likely as you age. This may be due, in part, to the hormonal changes that your body experiences during menopause. These can affect how your body processes dietary fats, triglycerides, and cholesterol. Heart attack and stroke are both medical emergencies. There are many things that you can do to help  prevent heart disease and stroke:  Have your blood pressure checked at least every 1-2 years. High blood pressure causes heart disease and increases the risk of stroke.  If you are 73-78 years old, ask your health care provider if you should take aspirin to prevent a heart attack or a stroke.  Do not use any tobacco products, including cigarettes, chewing tobacco, or electronic cigarettes. If you need help quitting, ask your health care provider.  It is important to eat a healthy diet and maintain a healthy weight. ? Be sure to include plenty of vegetables, fruits, low-fat dairy products, and lean protein. ? Avoid eating foods that are high in solid fats, added sugars, or salt (sodium).  Get regular exercise. This is one of the most important things that you can do for your health. ? Try to exercise for at least 150 minutes each week. The type of exercise that you do should increase your heart rate and make you sweat. This is known as moderate-intensity exercise. ? Try to do strengthening exercises at least twice each week. Do these in addition to the moderate-intensity exercise.  Know your numbers.Ask your health care provider to check your cholesterol and your blood glucose. Continue to have your blood tested as directed by your health care provider.  What should I know about cancer screening? There are several types of cancer. Take the following steps to reduce your risk and to catch any cancer development as early as possible. Breast Cancer  Practice breast self-awareness. ? This means understanding how your breasts normally appear and feel. ? It also means doing regular breast self-exams. Let your health care provider know about any changes, no matter how small.  you are 40 or older, have a clinician do a breast exam (clinical breast exam or CBE) every year. Depending on your age, family history, and medical history, it may be recommended that you also have a yearly breast X-ray  (mammogram).  If you have a family history of breast cancer, talk with your health care provider about genetic screening.  If you are at high risk for breast cancer, talk with your health care provider about having an MRI and a mammogram every year.  Breast cancer (BRCA) gene test is recommended for women who have family members with BRCA-related cancers. Results of the assessment will determine the need for genetic counseling and BRCA1 and for BRCA2 testing. BRCA-related cancers include these types: ? Breast. This occurs in males or females. ? Ovarian. ? Tubal. This may also be called fallopian tube cancer. ? Cancer of the abdominal or pelvic lining (peritoneal cancer). ? Prostate. ? Pancreatic.  Cervical, Uterine, and Ovarian Cancer Your health care provider may recommend that you be screened regularly for cancer of the pelvic organs. These include your ovaries, uterus, and vagina. This screening involves a pelvic exam, which includes checking for microscopic changes to the surface of your cervix (Pap test).  For women ages 21-65, health care providers may recommend a pelvic exam and a Pap test every three years. For women ages 30-65, they may recommend the Pap test and pelvic exam, combined with testing for human papilloma virus (HPV), every five years. Some types of HPV increase your risk of cervical cancer. Testing for HPV may also be done on women of any age who have unclear Pap test results.  Other health care providers may not recommend any screening for nonpregnant women who are considered low risk for pelvic cancer and have no symptoms. Ask your health care provider if a screening pelvic exam is right for you.  If you have had past treatment for cervical cancer or a condition that could lead to cancer, you need Pap tests and screening for cancer for at least 20 years after your treatment. If Pap tests have been discontinued for you, your risk factors (such as having a new sexual  partner) need to be reassessed to determine if you should start having screenings again. Some women have medical problems that increase the chance of getting cervical cancer. In these cases, your health care provider may recommend that you have screening and Pap tests more often.  If you have a family history of uterine cancer or ovarian cancer, talk with your health care provider about genetic screening.  If you have vaginal bleeding after reaching menopause, tell your health care provider.  There are currently no reliable tests available to screen for ovarian cancer.  Lung Cancer Lung cancer screening is recommended for adults 55-80 years old who are at high risk for lung cancer because of a history of smoking. A yearly low-dose CT scan of the lungs is recommended if you:  Currently smoke.  Have a history of at least 30 pack-years of smoking and you currently smoke or have quit within the past 15 years. A pack-year is smoking an average of one pack of cigarettes per day for one year.  Yearly screening should:  Continue until it has been 15 years since you quit.  Stop if you develop a health problem that would prevent you from having lung cancer treatment.  Colorectal Cancer  This type of cancer can be detected and can often be prevented.  Routine colorectal cancer screening   screening usually begins at age 50 and continues through age 75.  If you have risk factors for colon cancer, your health care provider may recommend that you be screened at an earlier age.  If you have a family history of colorectal cancer, talk with your health care provider about genetic screening.  Your health care provider may also recommend using home test kits to check for hidden blood in your stool.  A small camera at the end of a tube can be used to examine your colon directly (sigmoidoscopy or colonoscopy). This is done to check for the earliest forms of colorectal cancer.  Direct examination of the colon  should be repeated every 5-10 years until age 75. However, if early forms of precancerous polyps or small growths are found or if you have a family history or genetic risk for colorectal cancer, you may need to be screened more often.  Skin Cancer  Check your skin from head to toe regularly.  Monitor any moles. Be sure to tell your health care provider: ? About any new moles or changes in moles, especially if there is a change in a mole's shape or color. ? If you have a mole that is larger than the size of a pencil eraser.  If any of your family members has a history of skin cancer, especially at a Binnie Droessler age, talk with your health care provider about genetic screening.  Always use sunscreen. Apply sunscreen liberally and repeatedly throughout the day.  Whenever you are outside, protect yourself by wearing long sleeves, pants, a wide-brimmed hat, and sunglasses.  What should I know about osteoporosis? Osteoporosis is a condition in which bone destruction happens more quickly than new bone creation. After menopause, you may be at an increased risk for osteoporosis. To help prevent osteoporosis or the bone fractures that can happen because of osteoporosis, the following is recommended:  If you are 19-50 years old, get at least 1,000 mg of calcium and at least 600 mg of vitamin D per day.  If you are older than age 50 but younger than age 70, get at least 1,200 mg of calcium and at least 600 mg of vitamin D per day.  If you are older than age 70, get at least 1,200 mg of calcium and at least 800 mg of vitamin D per day.  Smoking and excessive alcohol intake increase the risk of osteoporosis. Eat foods that are rich in calcium and vitamin D, and do weight-bearing exercises several times each week as directed by your health care provider. What should I know about how menopause affects my mental health? Depression may occur at any age, but it is more common as you become older. Common symptoms of  depression include:  Low or sad mood.  Changes in sleep patterns.  Changes in appetite or eating patterns.  Feeling an overall lack of motivation or enjoyment of activities that you previously enjoyed.  Frequent crying spells.  Talk with your health care provider if you think that you are experiencing depression. What should I know about immunizations? It is important that you get and maintain your immunizations. These include:  Tetanus, diphtheria, and pertussis (Tdap) booster vaccine.  Influenza every year before the flu season begins.  Pneumonia vaccine.  Shingles vaccine.  Your health care provider may also recommend other immunizations. This information is not intended to replace advice given to you by your health care provider. Make sure you discuss any questions you have with your health care   provider. Document Released: 09/09/2005 Document Revised: 02/05/2016 Document Reviewed: 04/21/2015 Elsevier Interactive Patient Education  [employer].   1 now one in 2-6 month

## 2018-05-07 NOTE — Progress Notes (Signed)
Sandra Craig 1941/02/13 [id number]    History:    Presents for breast and pelvic exam.  1994 TVH with BSO for endometriosis on no HRT.  Normal Pap and mammogram history.  Osteoporosis Reclast 2011, 2012, Prolia 2013, 14 and 15.  History of a right hip and shoulder fracture with traumatic fall.  Last DEXA 2016.  2013- colon polyp.  Vaccines current.  Primary care manages hypertension, TIAs, kidney disease, anxiety and chronic back pain.  Recently became involved with a new partner, not sexually active history of prostate cancer.  Past medical history, past surgical history, family history and social history were all reviewed and documented in the EPIC chart.  Is traveling in an RV with new partner, has a  toy poodle.  ROS:  A ROS was performed and pertinent positives and negatives are included.  Exam:  Vitals:   05/07/18 1207  BP: 120/74  Weight: 135 lb (61.2 kg)  Height: 5\' 6"  (1.676 m)   Body mass index is 21.79 kg/m.   General appearance:  Normal Thyroid:  Symmetrical, normal in size, without palpable masses or nodularity. Respiratory  Auscultation:  Clear without wheezing or rhonchi Cardiovascular  Auscultation:  Regular rate, without rubs, murmurs or gallops  Edema/varicosities:  Not grossly evident Abdominal  Soft,nontender, without masses, guarding or rebound.  Liver/spleen:  No organomegaly noted  Hernia:  None appreciated  Skin  Inspection:  Grossly normal   Breasts: Examined lying and sitting.     Right: Without masses, retractions, discharge or axillary adenopathy.     Left: Without masses, retractions, discharge or axillary adenopathy. Gentitourinary   Inguinal/mons:  Normal without inguinal adenopathy  External genitalia:  Normal  BUS/Urethra/Skene's glands:  Normal  Vagina:  Normal  Cervix: And uterus absent  Adnexa/parametria:     Rt: Without masses or tenderness.   Lt: Without masses or tenderness.  Anus and perineum: Normal  Digital rectal  exam: Normal sphincter tone without palpated masses or tenderness  Assessment/Plan:  77 y.o. WWF G1 P0 for breast and pelvic exam with vaginal dryness.  23 TVH with BSO for endometriosis on no HRT Hypertension, TIA, kidney disease, chronic back pain-primary care manages labs and meds Skin cancer-annual dermatology checks Osteoporosis 2 years of Reclast, 3 years of Prolia currently on no med  Plan: SBE's, continue annual screening mammogram, repeat DEXA.  Exercise, weightbearing and balance type exercise reviewed and encouraged.  Home safety fall prevention discussed.  Will use over-the-counter vaginal lubricants as needed, reviewed best to use no estrogen containing products.    Huel Cote [facility], 1:19 PM 05/07/2018

## 2018-05-10 DIAGNOSIS — M5416 Radiculopathy, lumbar region: Secondary | ICD-10-CM | POA: Diagnosis not present

## 2018-05-10 DIAGNOSIS — M48061 Spinal stenosis, lumbar region without neurogenic claudication: Secondary | ICD-10-CM | POA: Diagnosis not present

## 2018-05-28 DIAGNOSIS — M25511 Pain in right shoulder: Secondary | ICD-10-CM | POA: Diagnosis not present

## 2018-05-29 DIAGNOSIS — M542 Cervicalgia: Secondary | ICD-10-CM | POA: Diagnosis not present

## 2018-05-29 DIAGNOSIS — M47812 Spondylosis without myelopathy or radiculopathy, cervical region: Secondary | ICD-10-CM | POA: Diagnosis not present

## 2018-07-10 ENCOUNTER — Ambulatory Visit (INDEPENDENT_AMBULATORY_CARE_PROVIDER_SITE_OTHER): Payer: Medicare Other

## 2018-07-10 DIAGNOSIS — Z780 Asymptomatic menopausal state: Secondary | ICD-10-CM

## 2018-07-10 DIAGNOSIS — M8589 Other specified disorders of bone density and structure, multiple sites: Secondary | ICD-10-CM | POA: Diagnosis not present

## 2018-07-10 DIAGNOSIS — E2839 Other primary ovarian failure: Secondary | ICD-10-CM

## 2018-07-17 ENCOUNTER — Encounter: Payer: Self-pay | Admitting: Adult Health

## 2018-07-17 ENCOUNTER — Ambulatory Visit (INDEPENDENT_AMBULATORY_CARE_PROVIDER_SITE_OTHER): Payer: Medicare Other | Admitting: Adult Health

## 2018-07-17 VITALS — BP 144/84 | HR 82 | Ht 66.0 in | Wt 137.8 lb

## 2018-07-17 DIAGNOSIS — I10 Essential (primary) hypertension: Secondary | ICD-10-CM | POA: Diagnosis not present

## 2018-07-17 DIAGNOSIS — E785 Hyperlipidemia, unspecified: Secondary | ICD-10-CM

## 2018-07-17 DIAGNOSIS — Z8673 Personal history of transient ischemic attack (TIA), and cerebral infarction without residual deficits: Secondary | ICD-10-CM | POA: Diagnosis not present

## 2018-07-17 NOTE — Progress Notes (Signed)
I agree with the above plan 

## 2018-07-17 NOTE — Progress Notes (Signed)
[facility] [address] [place]. [place] [address] [phone]       OFFICE CONSULT NOTE  Ms. BRYNLEA SPINDLER Date of Birth:  1941/03/26 Medical Record Number:  [id number]   Reason for Referral:  TIA  HPI: Sandra Craig is a 77 year old female who is being seen for follow up from hospital TIA on 06/02/2017, PMH of HLD, HTN, polycystic kidney disease, chronic kidney disease, chronic low back pain, presented to the ED on 06/02/2017 with sudden onset of difficulty speaking/difficulty finding words while she was talking to her sister on the phone on 06/02/17 at 9 AM. She stated she knew the words she wanted to say but could not get them out. This lasted for approximately 8 minutes and then resolved spontaneously. She denied any other strokelike symptoms such as weakness or numbness. CT head did not show acute findings. MRI showed no acute stroke but did show small vessel diease. MRA negative. Patient started on Aspirin 325mg  for secondary stroke prevention. Previously taking crestor prior to TIA in which she will continue. LDL controlled at 53. Transthoracic echo was normal. Currently not on HTN medications. BP at today's visit at 142/80 which per patient is higher then normal for her. Patient reports episode in August 2018 where she "passed out". Her PCP d/c'd her BP medications and since this time has not had any other syncopal events.   07/20/17 visit Dr. Leonie Man: Since the TIA, patient has not had any other stroke/TIA symptoms. Denies speech/language difficulties. She overall feels well. Continues to eat a healthy diet and exercise. Currently wearing 30 day CardioNet monitoring to rule out A. Fib. She states her mother had a sudden cardiac death in her 1s. Patient states she was diagnosed with possible A. fib years ago but on inquiry she states "and "irregular heart beat. She is unable to give any specific details as to whether she had workup done at that time are not  01/23/18  UPDATE: Patient returns today for six-month follow-up.  Approximately 4 weeks ago, she stopped aspirin EC 325 mg due to worsening GERD despite changes and GERD medications.  Approximately 3 weeks ago, she did have an episode where she was unable to see her left eye in the near and worsened 1 to try to focus on it.  This lasted approximately 30 minutes and then resolved.  Patient denies history of the symptoms.  Denies headache at that time, history of headaches or migraines along with family history of migraines.  Patient questioning whether she can be started on aspirin 81 mg as she is unable to tolerate aspirin 325 despite use of aspirin due to fact thatEC.  Aspirin was stopped approximately 4 weeks ago with an episode occurred 3 weeks ago and patient is reluctant to start Plavix, is agreed upon to start aspirin 81 mg but patient is aware that if any of the symptoms do recur again Plavix will be started at that time.  Also advised patient importance of calling 911 with these symptoms along with notifying us if she is unable to tolerate medication prior to stopping.  Blood pressure mildly low at 102/65.  Patient did undergo 30-day cardiac monitor which was negative for atrial fibrillation.  At previous appointment, is recommended that if monitor negative to have loop recorder placed but this did not happen.  Patient states she is agreeable to this if needed but has not heard anything since that time.  Patient continues to do all previous activities without complications.  No further  concerns or questions at this time.  Interval history 07/17/2018: Patient is being seen today for 61-month follow-up visit.  She has been doing well without any additional episodes. She has continued aspirin 81 mg without side effects of bleeding or bruising.  She continues on Crestor without side effects of myalgias.  Blood pressure today 144/84. She does state that when she was at an appointment recently it was at 170s/80s. She has  not been consistently checking it at home but plans on restarting. She will be following up with her PCP tomorrow. She continues on crestor without side effects of myalgias.  No further concerns at this time.  Denies new or worsening stroke/TIA symptoms.      ROS:   14 system review of systems is positive for joint pain, back pain, muscle cramps, neck pain and snoring all other systems negative.  PMH:  Past Medical History:  Diagnosis Date  . 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 Chronic insomnia   . Cystitis   . Diverticulitis of intestine without perforation or abscess without bleeding    Patient did have abscess but noperforation  . Diverticulosis of colon (without mention of hemorrhage)   . Endometriosis   . Family history of malignant neoplasm of gastrointestinal tract   . Fibromyalgia   . Gastritis   . Hiatal hernia   . HIATAL HERNIA 10/08/2008   Qualifier: Diagnosis of  By: Nils Pyle CMA ([place]), Mearl Latin    . History of gallstones   . Hyperlipidemia   . Hypertension   . IBS (irritable bowel syndrome)   . IC (interstitial cystitis)   . Internal hemorrhoid   . Osteonecrosis ([place])   . Osteopenia   . Osteoporosis   . Palpitations   . Polycystic kidney disease   . Pulmonary nodule 12/08   5 mm Anterior RUL  . Small bowel obstruction ([place])   . Stroke [facility])     Social History:  Social History   Socioeconomic History  . Marital status: Widowed    Spouse name: Not on file  . Number of children: Not on file  . Years of education: Not on file  . Highest education level: Not on file  Occupational History  . Occupation: Retired    Comment: Manufacturing engineer  Social Needs  . Financial resource strain: Not on file  . Food insecurity:    Worry: Not on file    Inability: Not on file  . Transportation needs:    Medical: Not on file    Non-medical: Not on file  Tobacco Use  . Smoking status: Former Smoker    Packs/day: 0.75    Years: 8.00    Pack  years: 6.00    Types: Cigarettes    Last attempt to quit: 08/01/1976    Years since quitting: 41.9  . Smokeless tobacco: Never Used  . Tobacco comment: Quit in 1978  Substance and Sexual Activity  . Alcohol use: No    Alcohol/week: 0.0 standard drinks  . Drug use: No  . Sexual activity: Yes  Lifestyle  . Physical activity:    Days per week: Not on file    Minutes per session: Not on file  . Stress: Not on file  Relationships  . Social connections:    Talks on phone: Not on file    Gets together: Not on file    Attends religious service: Not on file    Active member of club or organization: Not on file  Attends meetings of clubs or organizations: Not on file    Relationship status: Not on file  . Intimate partner violence:    Fear of current or ex partner: Not on file    Emotionally abused: Not on file    Physically abused: Not on file    Forced sexual activity: Not on file  Other Topics Concern  . Not on file  Social History Narrative  . Not on file    Medications:   Current Outpatient Medications on File Prior to Visit  Medication Sig Dispense Refill  . aspirin 81 MG tablet Take 1 tablet (81 mg total) by mouth daily. 30 tablet   . DULoxetine (CYMBALTA) 30 MG capsule Take 1 capsule (30 mg total) by mouth 2 (two) times daily. 180 capsule 1  . gabapentin (NEURONTIN) 300 MG capsule TAKE 1 CAPSULE(300 MG) BY MOUTH TWICE DAILY 7 capsule 0  . Polyethyl Glycol-Propyl Glycol (SYSTANE) 0.4-0.3 % GEL ophthalmic gel Place 1 application into both eyes 3 (three) times daily as needed (dry eye).    . ranitidine (ZANTAC) 150 MG tablet Take 150 mg by mouth 2 (two) times daily.    . rosuvastatin (CRESTOR) 20 MG tablet Take 1 tablet (20 mg total) by mouth daily. 3 tablet 0  . zolpidem (AMBIEN) 10 MG tablet Take 1 tablet (10 mg total) by mouth at bedtime as needed. for sleep 90 tablet 1   No current facility-administered medications on file prior to visit.     Allergies:   Allergies    Allergen Reactions  . Azithromycin Rash    Pruritic rash diffuse   . Celecoxib Nausea Only  . Sulfonamide Derivatives Rash    Physical Exam General: well developed, well nourished pleasant elderly Caucasian lady, seated, in no evident distress Head: head normocephalic and atraumatic.   Neck: supple with no carotid or supraclavicular bruits Cardiovascular: regular rate and rhythm, no murmurs Musculoskeletal: no deformity Skin:  no rash/petichiae Vascular:  Normal pulses all extremities  Neurologic Exam Mental Status: Awake and fully alert. Oriented to place and time. Recent and remote memory intact. Attention span, concentration and fund of knowledge appropriate. Mood and affect appropriate.  Cranial Nerves: Fundoscopic exam reveals sharp disc margins. Pupils equal, briskly reactive to light. Extraocular movements full without nystagmus. Visual fields full to confrontation. Hearing intact. Facial sensation intact. Face, tongue, palate moves normally and symmetrically.  Motor: Normal bulk and tone. Normal strength in all tested extremity muscles. Sensory.: intact to touch , pinprick , position and vibratory sensation.  Coordination: Rapid alternating movements normal in all extremities. Finger-to-nose and heel-to-shin performed accurately bilaterally. Gait and Station: Arises from chair without difficulty. Stance is normal. Gait demonstrates normal stride length and balance . Able to heel, toe and tandem walk without difficulty.  Reflexes: 2+ and symmetric. Toes downgoing.     IMAGING:  EEG ADULT 08/15/2017 Summary Normal electroencephalogram, awake, asleep and with activation procedures. There are no focal lateralizing or epileptiform features.  [place] Negative for irregular heartbeat, arrhythmias or atrial fibrillation     ASSESSMENT: Ms Podgorski is a 77 year old Caucasian female who presented with expressive aphasia in Nov 2018 diagnosed with TIA as MRI and  CT scan negative. PMH HLD and HTN.  Patient is being seen today for follow-up visit and overall has been stable without recurrent symptoms.   PLAN: -Continue aspirin 81 mg and Crestor for secondary stroke prevention -f/u with PCP regarding HLD and HTN management -Advised to restart monitoring  blood pressure at home.  Advised patient that most likely one-time elevated blood pressure with SBP 170s at recent appointment was most likely random elevation from being on appointment and was advised to monitor blood pressure at home tonight and tomorrow morning prior to her follow-up appointment with PCP.  Blood pressure stable at today's appointment. -Continue to stay active and eat healthy -Maintain strict control of hypertension with blood pressure goal below 130/90, diabetes with hemoglobin A1c goal below 6.5% and cholesterol with LDL cholesterol (bad cholesterol) goal below 70 mg/dL. I also advised the patient to eat a healthy diet with plenty of whole grains, cereals, fruits and vegetables, exercise regularly and maintain ideal body weight.  Followup in the future with me as needed as it has been greater than 1 year since her TIA event and has been stable since this time.  Patient was advised to call office in the future with questions, concerns or need of possible appointment.   Greater than 50% of time during this 25 minute visit was spent on counseling,explanation of diagnosis of TIA, reviewing risk factor management of HLD and HTN, planning of further management, discussion with patient and family and coordination of care  Venancio Poisson, [facility]  [facility] [address] [place], [place] [address]  Phone [phone] Fax [phone] Note: This document was prepared with digital dictation and possible smart phrase technology. Any transcriptional errors that result from this process are unintentional.

## 2018-07-17 NOTE — Patient Instructions (Signed)
Continue aspirin 81 mg daily  and Crestor  for secondary stroke prevention  Continue to follow up with PCP regarding cholesterol and blood pressure management   Continue to stay active and maintain a healthy diet  Continue to monitor blood pressure at home  Maintain strict control of hypertension with blood pressure goal below 130/90, diabetes with hemoglobin A1c goal below 6.5% and cholesterol with LDL cholesterol (bad cholesterol) goal below 70 mg/dL. I also advised the patient to eat a healthy diet with plenty of whole grains, cereals, fruits and vegetables, exercise regularly and maintain ideal body weight.  Followup in the future with me as needed or call earlier if needed       Thank you for coming to see [place] at [facility]. I hope we have been able to provide you high quality care today.  You may receive a patient satisfaction survey over the next few weeks. We would appreciate your feedback and comments so that we may continue to improve ourselves and the health of our patients.

## 2018-07-18 ENCOUNTER — Ambulatory Visit (INDEPENDENT_AMBULATORY_CARE_PROVIDER_SITE_OTHER): Payer: Medicare Other | Admitting: Family Medicine

## 2018-07-18 ENCOUNTER — Encounter: Payer: Self-pay | Admitting: Family Medicine

## 2018-07-18 VITALS — BP 144/82 | HR 83 | Temp 98.3°F | Ht 66.0 in | Wt 137.0 lb

## 2018-07-18 DIAGNOSIS — M810 Age-related osteoporosis without current pathological fracture: Secondary | ICD-10-CM | POA: Diagnosis not present

## 2018-07-18 DIAGNOSIS — I10 Essential (primary) hypertension: Secondary | ICD-10-CM | POA: Diagnosis not present

## 2018-07-18 DIAGNOSIS — E785 Hyperlipidemia, unspecified: Secondary | ICD-10-CM | POA: Diagnosis not present

## 2018-07-18 DIAGNOSIS — M797 Fibromyalgia: Secondary | ICD-10-CM

## 2018-07-18 DIAGNOSIS — G4700 Insomnia, unspecified: Secondary | ICD-10-CM | POA: Diagnosis not present

## 2018-07-18 DIAGNOSIS — K219 Gastro-esophageal reflux disease without esophagitis: Secondary | ICD-10-CM | POA: Diagnosis not present

## 2018-07-18 DIAGNOSIS — K222 Esophageal obstruction: Secondary | ICD-10-CM

## 2018-07-18 LAB — COMPREHENSIVE METABOLIC PANEL
ALT: 23 U/L (ref 0–35)
AST: 24 U/L (ref 0–37)
Albumin: 4.1 g/dL (ref 3.5–5.2)
Alkaline Phosphatase: 68 U/L (ref 39–117)
BUN: 23 mg/dL (ref 6–23)
CO2: 30 mEq/L (ref 19–32)
Calcium: 9.2 mg/dL (ref 8.4–10.5)
Chloride: 104 mEq/L (ref 96–112)
Creatinine, Ser: 1.56 mg/dL — ABNORMAL HIGH (ref 0.40–1.20)
GFR: 34.28 mL/min — ABNORMAL LOW (ref >60.00)
Glucose, Bld: 98 mg/dL (ref 70–99)
Potassium: 4.8 mEq/L (ref 3.5–5.1)
Sodium: 140 mEq/L (ref 135–145)
Total Bilirubin: 0.4 mg/dL (ref 0.2–1.2)
Total Protein: 6.6 g/dL (ref 6.0–8.3)

## 2018-07-18 LAB — CBC
HCT: 37.1 % (ref 36.0–46.0)
Hemoglobin: 12 g/dL (ref 12.0–15.0)
MCHC: 32.4 g/dL (ref 30.0–36.0)
MCV: 84.1 fl (ref 78.0–100.0)
Platelets: 189 10*3/uL (ref 150.0–400.0)
RBC: 4.41 Mil/uL (ref 3.87–5.11)
RDW: 16.6 % — ABNORMAL HIGH (ref 11.5–15.5)
WBC: 5.3 10*3/uL (ref 4.0–10.5)

## 2018-07-18 LAB — LIPID PANEL
Cholesterol: 149 mg/dL (ref 0–200)
HDL: 66.7 mg/dL (ref >39.00)
LDL Cholesterol: 69 mg/dL (ref 0–99)
NonHDL: 81.8
Total CHOL/HDL Ratio: 2
Triglycerides: 63 mg/dL (ref 0.0–149.0)
VLDL: 12.6 mg/dL (ref 0.0–40.0)

## 2018-07-18 MED ORDER — AMLODIPINE BESYLATE 2.5 MG PO TABS: 2.5000 mg | ORAL_TABLET | Freq: Every day | ORAL | 3 refills | Status: DC

## 2018-07-18 MED ORDER — ZOLPIDEM TARTRATE 5 MG PO TABS: 5.0000 mg | ORAL_TABLET | Freq: Every evening | ORAL | 1 refills | Status: DC | PRN

## 2018-07-18 MED ORDER — FAMOTIDINE 20 MG PO TABS: 20.0000 mg | ORAL_TABLET | Freq: Two times a day (BID) | ORAL | 3 refills | Status: DC

## 2018-07-18 MED ORDER — DULOXETINE HCL 30 MG PO CPEP: 30.0000 mg | ORAL_CAPSULE | Freq: Two times a day (BID) | ORAL | 1 refills | Status: DC

## 2018-07-18 MED ORDER — ROSUVASTATIN CALCIUM 20 MG PO TABS: 20.0000 mg | ORAL_TABLET | Freq: Every day | ORAL | 3 refills | Status: DC

## 2018-07-18 MED ORDER — GABAPENTIN 300 MG PO CAPS: ORAL_CAPSULE | ORAL | 3 refills | Status: DC

## 2018-07-18 NOTE — Assessment & Plan Note (Signed)
Trial ambien 5 mg instead of 10 mg. If do really well after 1 month or so could trial half tablet of 5 mg

## 2018-07-18 NOTE — Assessment & Plan Note (Addendum)
S: gabapentin 300mg  has been helpful- she may try twice a day if doesn't make too sleepy but has mainly been doing before bed. Also on cymbalta 30mg  BID which finds helpful A/P: stable- refill provided on gabapentin

## 2018-07-18 NOTE — Assessment & Plan Note (Signed)
S: Has been controlled on rosuvastatin 20 mg daily  Lab Results  Component Value Date   CHOL 142 06/03/2017   HDL 64 06/03/2017   LDLCALC 53 06/03/2017   LDLDIRECT 73.0 01/11/2018   TRIG 123 06/03/2017   CHOLHDL 2.2 06/03/2017   A/P: stable hopefully- print rx as all rx today so she can price shop- update lipid panel

## 2018-07-18 NOTE — Progress Notes (Signed)
Subjective:  Sandra Craig is a 77 y.o. year old very pleasant female patient who presents for/with See problem oriented charting ROS- No chest pain. No increased shortness of breath. No headache or blurry vision.   Past Medical History-  Patient Active Problem List   Diagnosis Date Noted  . History of transient ischemic attack (TIA) 06/02/2017    Priority: High  . 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 Chronic low back pain 12/20/2013    Priority: High  . Fibromyalgia 12/05/2007    Priority: High  . CKD (chronic kidney disease), stage III ([place]) 06/02/2017    Priority: Medium  . Near syncope 03/11/2017    Priority: Medium  . History of small bowel obstruction 06/28/2011    Priority: Medium  . GERD with stricture 06/28/2011    Priority: Medium  . Polycystic kidney disease     Priority: Medium  . Hypertension     Priority: Medium  . Hyperlipidemia     Priority: Medium  . Osteoporosis 12/21/2009    Priority: Medium  . INSOMNIA, CHRONIC 10/08/2008    Priority: Medium  . Depression 02/08/2008    Priority: Medium  . Chronic right shoulder pain 03/07/2014    Priority: Low  . Benign essential tremor 10/09/2013    Priority: Low  . Hyperglycemia 06/27/2012    Priority: Low  . Diverticulitis large intestine 02/11/2012    Priority: Low  . Irritable bowel syndrome (IBS) 06/28/2011    Priority: Low  . IC (interstitial cystitis)     Priority: Low  . Hemorrhoids 10/08/2008    Priority: Low  . 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 HEMATURIA UNSPECIFIED 07/04/2008    Priority: Low  . Lung nodules 08/09/2007    Priority: Low  . Dysuria 02/24/2018    Medications- reviewed and updated Current Outpatient Medications  Medication Sig Dispense Refill  . aspirin 81 MG tablet Take 1 tablet (81 mg total) by mouth daily. 30 tablet   . DULoxetine (CYMBALTA) 30 MG capsule Take 1 capsule (30 mg  total) by mouth 2 (two) times daily. 180 capsule 1  . famotidine (PEPCID) 20 MG tablet Take 1 tablet (20 mg total) by mouth 2 (two) times daily. 180 tablet 3  . gabapentin (NEURONTIN) 300 MG capsule TAKE 1 CAPSULE(300 MG) BY MOUTH TWICE DAILY 7 capsule 0  . Polyethyl Glycol-Propyl Glycol (SYSTANE) 0.4-0.3 % GEL ophthalmic gel Place 1 application into both eyes 3 (three) times daily as needed (dry eye).    . rosuvastatin (CRESTOR) 20 MG tablet Take 1 tablet (20 mg total) by mouth daily. 3 tablet 0  . zolpidem (AMBIEN) 10 MG tablet Take 1 tablet (10 mg total) by mouth at bedtime as needed. for sleep 90 tablet 1   No current facility-administered medications for this visit.     Objective: BP (!) 144/82 (BP Location: Left Arm, Patient Position: Sitting, Cuff Size: Large)   Pulse 83   Temp 98.3 F (36.8 C) (Oral)   Ht 5\' 6"  (1.676 m)   Wt 137 lb (62.1 kg)   LMP 08/25/1992   SpO2 97%   BMI 22.11 kg/m  Gen: NAD, resting comfortably CV: RRR no murmurs rubs or gallops Lungs: CTAB no crackles, wheeze, rhonchi Abdomen: soft/nontender/nondistended Ext: no edema Skin: warm, dry    Assessment/Plan:  Hypertension S: controlled on no Rx at last visit-in the past she had near syncope on losartan 25  mg. Her home #s are highly variable- see picture above- some as low as 120s but others up over 180. She is watching her salt intake. She is going to start therapy for neck and back pain- exercise limited as a result.  BP Readings from Last 3 Encounters:  07/18/18 (!) 144/82  07/17/18 (!) 144/84  05/07/18 120/74  A/P: Highly variable BP. We discussed blood pressure goal of <140/90  at least- have to be cautious given history presyncope .start amlodipine 2.5 mg- I want her to just use 1.25mg  to start to see how she does with that- we may increase if we are still getting numbers over 160 as long as lower blood pressures not getting below 110  Fibromyalgia S: gabapentin 300mg  has been helpful- she may  try twice a day if doesn't make too sleepy but has mainly been doing before bed. Also on cymbalta 30mg  BID which finds helpful A/P: stable- refill provided on gabapentin   Hyperlipidemia S: Has been controlled on rosuvastatin 20 mg daily  Lab Results  Component Value Date   CHOL 142 06/03/2017   HDL 64 06/03/2017   LDLCALC 53 06/03/2017   LDLDIRECT 73.0 01/11/2018   TRIG 123 06/03/2017   CHOLHDL 2.2 06/03/2017   A/P: stable hopefully- print rx as all rx today so she can price shop- update lipid panel     GERD with stricture S: Patient with acid reflux and is on Zantac 150 mg twice a day-due to recall needs to be switched.  The aspirin she is on due to her history of TIA may be triggering. A/P: Reasonable control-lets transition to Pepcid 20 mg twice a day which I think will give her reasonable control.  INSOMNIA, CHRONIC Trial ambien 5 mg instead of 10 mg. If do really well after 1 month or so could trial half tablet of 5 mg   Future Appointments  Date Time Provider [place]  07/19/2018 11:15 AM Juanito Doom, MD [facility] None  07/26/2018  1:00 PM [facility] HEALTH COACH [facility]  08/09/2018 11:20 AM [name], MD [facility]  05/13/2019 11:00 AM Huel Cote, NP [facility]   No follow-ups on file. j Lab/Order associations: had coffee with creamer this AM only No diagnosis found.  Meds ordered this encounter  Medications  . famotidine (PEPCID) 20 MG tablet    Sig: Take 1 tablet (20 mg total) by mouth 2 (two) times daily.    Dispense:  180 tablet    Refill:  3    Return precautions advised.  Garret Reddish, MD

## 2018-07-18 NOTE — Assessment & Plan Note (Signed)
S: Patient with acid reflux and is on Zantac 150 mg twice a day-due to recall needs to be switched.  The aspirin she is on due to her history of TIA may be triggering. A/P: Reasonable control-lets transition to Pepcid 20 mg twice a day which I think will give her reasonable control.

## 2018-07-18 NOTE — Assessment & Plan Note (Signed)
S: controlled on no Rx at last visit-in the past she had near syncope on losartan 25 mg. Her home #s are highly variable- see picture above- some as low as 120s but others up over 180. She is watching her salt intake. She is going to start therapy for neck and back pain- exercise limited as a result.  BP Readings from Last 3 Encounters:  07/18/18 (!) 144/82  07/17/18 (!) 144/84  05/07/18 120/74  A/P: Highly variable BP. We discussed blood pressure goal of <140/90  at least- have to be cautious given history presyncope .start amlodipine 2.5 mg- I want her to just use 1.25mg  to start to see how she does with that- we may increase if we are still getting numbers over 160 as long as lower blood pressures not getting below 110

## 2018-07-18 NOTE — Patient Instructions (Addendum)
Please stop by lab before you go  Trial amlodipine 2.5mg  tablet- start with just half tablet- let me know in 2-3 weeks how blood pressures are looking at home. Perhaps lets check in 6 weeks from now.   Transition to pepcid instead of zantac  Trial ambien 5 mg instead of 10 mg. If do really well after 1 month or so could trial half tablet of 5 mg

## 2018-07-19 ENCOUNTER — Telehealth: Payer: Self-pay | Admitting: Family Medicine

## 2018-07-19 ENCOUNTER — Other Ambulatory Visit: Payer: Self-pay | Admitting: Women's Health

## 2018-07-19 ENCOUNTER — Encounter: Payer: Self-pay | Admitting: Pulmonary Disease

## 2018-07-19 ENCOUNTER — Other Ambulatory Visit: Payer: Self-pay | Admitting: Gynecology

## 2018-07-19 ENCOUNTER — Ambulatory Visit (INDEPENDENT_AMBULATORY_CARE_PROVIDER_SITE_OTHER): Payer: Medicare Other | Admitting: Pulmonary Disease

## 2018-07-19 VITALS — BP 144/78 | HR 83 | Wt 137.6 lb

## 2018-07-19 DIAGNOSIS — Z780 Asymptomatic menopausal state: Secondary | ICD-10-CM

## 2018-07-19 DIAGNOSIS — M8589 Other specified disorders of bone density and structure, multiple sites: Secondary | ICD-10-CM

## 2018-07-19 DIAGNOSIS — A310 Pulmonary mycobacterial infection: Secondary | ICD-10-CM

## 2018-07-19 DIAGNOSIS — M47892 Other spondylosis, cervical region: Secondary | ICD-10-CM | POA: Diagnosis not present

## 2018-07-19 DIAGNOSIS — M2560 Stiffness of unspecified joint, not elsewhere classified: Secondary | ICD-10-CM | POA: Diagnosis not present

## 2018-07-19 DIAGNOSIS — M542 Cervicalgia: Secondary | ICD-10-CM | POA: Diagnosis not present

## 2018-07-19 NOTE — Patient Instructions (Signed)
History of MAI: If you have unexplained fevers, night sweats, chills or weight loss please let us know If you have repeated episodes of bronchitis please let us know  Otherwise, you can follow-up with [place] on an as-needed basis

## 2018-07-19 NOTE — Progress Notes (Signed)
Subjective:    Patient ID: Sandra Craig, female    DOB: [birth year], 77 y.o.   MRN: [id number]  Synopsis: Former patient of Dr. 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  She was initially followed for a pulmonary nodule in 2008. She used to smoke and quit in 1977 after ten years.  < 1 ppd.  She was diagnosed with a bronchoscopy in June 2016. A CT scan in 2016 showed characteristic tree in bud abnormalities which were worrisome for Mycobacterium avium intercellular. She started 3 drug therapy in August 2016, but had to stop azithromycin after 2 weeks because of a rash. Started clarithomycin.   Finished treatment in August 2017.  HPI Chief Complaint  Patient presents with  . Follow-up    yearly visit - no complaints    She has had a good year > no weight los > no bronchitis > no change in mild hacking cough > no fever or chills    Past Medical History:  Diagnosis Date  . 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 Chronic insomnia   . Cystitis   . Diverticulitis of intestine without perforation or abscess without bleeding    Patient did have abscess but noperforation  . Diverticulosis of colon (without mention of hemorrhage)   . Endometriosis   . Family history of malignant neoplasm of gastrointestinal tract   . Fibromyalgia   . Gastritis   . Hiatal hernia   . HIATAL HERNIA 10/08/2008   Qualifier: Diagnosis of  By: Nils Pyle CMA ([place]), Mearl Latin    . History of gallstones   . Hyperlipidemia   . Hypertension   . IBS (irritable bowel syndrome)   . IC (interstitial cystitis)   . Internal hemorrhoid   . Osteonecrosis ([place])   . Osteopenia   . Osteoporosis   . Palpitations   . Polycystic kidney disease   . Pulmonary nodule 12/08   5 mm Anterior RUL  . Small bowel obstruction ([place])   . Stroke [facility])       Review of Systems     Objective:   Physical Exam Vitals:   07/19/18 1126  BP: (!) 144/78  Pulse: 83  SpO2: 99%  Weight: 137 lb 9.6 oz  (62.4 kg)    RA  Gen: well appearing HENT: OP clear, TM's clear, neck supple PULM: CTA B, normal percussion CV: RRR, no mgr, trace edema GI: BS+, soft, nontender Derm: no cyanosis or rash Psyche: normal mood and affect    Chest imaging 12/2016 CT chest> stable RUL tree-in-bud nodules, some emphysema     Assessment & Plan:   MAI (mycobacterium avium-intracellulare) infection ([place])  Discussion: This has been a stable interval for [place].  She has not had an exacerbation of her MAI and though she has some mild centrilobular emphysema seen on the CT scan of her chest there is nothing to suggest that this is causing her to have any symptoms.  From this point moving forward she can follow-up with [place] on an as needed basis.  Plan: History of MAI: If you have unexplained fevers, night sweats, chills or weight loss please let us know If you have repeated episodes of bronchitis please let us know  Otherwise, you can follow-up with [place] on an as-needed basis    Current Outpatient Medications:  .  amLODipine (NORVASC) 2.5 MG tablet, Take 1 tablet (2.5 mg total) by mouth daily., Disp: 90 tablet, Rfl: 3 .  aspirin 81 MG tablet, Take 1 tablet (81  mg total) by mouth daily., Disp: 30 tablet, Rfl:  .  DULoxetine (CYMBALTA) 30 MG capsule, Take 1 capsule (30 mg total) by mouth 2 (two) times daily., Disp: 180 capsule, Rfl: 1 .  famotidine (PEPCID) 20 MG tablet, Take 1 tablet (20 mg total) by mouth 2 (two) times daily., Disp: 180 tablet, Rfl: 3 .  gabapentin (NEURONTIN) 300 MG capsule, TAKE 1 CAPSULE(300 MG) BY MOUTH TWICE DAILY AS NEEDED, Disp: 180 capsule, Rfl: 3 .  Polyethyl Glycol-Propyl Glycol (SYSTANE) 0.4-0.3 % GEL ophthalmic gel, Place 1 application into both eyes 3 (three) times daily as needed (dry eye)., Disp: , Rfl:  .  rosuvastatin (CRESTOR) 20 MG tablet, Take 1 tablet (20 mg total) by mouth daily., Disp: 90 tablet, Rfl: 3 .  zolpidem (AMBIEN) 5 MG tablet, Take 1 tablet (5 mg total) by  mouth at bedtime as needed. for sleep, Disp: 90 tablet, Rfl: 1

## 2018-07-19 NOTE — Telephone Encounter (Signed)
Tillie Rung spoke with patient this morning

## 2018-07-19 NOTE — Telephone Encounter (Signed)
Appointment scheduled for January 3.

## 2018-07-19 NOTE — Telephone Encounter (Signed)
See note  Copied from [place] #[id number]. Topic: General - Other >> Jul 19, 2018  9:47 AM Leward Quan A wrote: Reason for CRM: Patient called stated that she had a missed call on her cell and home phone request to speak to the person that called. Nothing documented in chart called office to speak with CMA but she was unavailable. Patient denied speaking to anyone in the office about lab results or anything else. And request a call back please. Ph# [phone]

## 2018-07-24 ENCOUNTER — Other Ambulatory Visit: Payer: Self-pay

## 2018-07-24 DIAGNOSIS — N183 Chronic kidney disease, stage 3 unspecified: Secondary | ICD-10-CM

## 2018-07-26 ENCOUNTER — Ambulatory Visit (INDEPENDENT_AMBULATORY_CARE_PROVIDER_SITE_OTHER): Payer: Medicare Other

## 2018-07-26 VITALS — BP 112/78 | HR 68 | Temp 97.9°F | Ht 66.0 in | Wt 138.4 lb

## 2018-07-26 DIAGNOSIS — Z0000 Encounter for general adult medical examination without abnormal findings: Secondary | ICD-10-CM

## 2018-07-26 NOTE — Progress Notes (Signed)
I have personally reviewed the Medicare Annual Wellness Visit and agree with the assessment and plan.  Algis Greenhouse. Jerline Pain, MD 07/26/2018 4:37 PM

## 2018-07-26 NOTE — Patient Instructions (Addendum)
Sandra Craig , Thank you for taking time to come for your Medicare Wellness Visit. I appreciate your ongoing commitment to your health goals. Please review the following plan we discussed and let me know if I can assist you in the future.   These are the goals we discussed: Goals    . Patient Stated (pt-stated)     Patient will join [employer] after the first of the 2020 year. She will work out at [employer].       This is a list of the screening recommended for you and due dates:  Health Maintenance  Topic Date Due  . Mammogram  01/12/2019  . Tetanus Vaccine  12/22/2019  . Flu Shot  Completed  . DEXA scan (bone density measurement)  Completed  . Pneumonia vaccines  Completed    Preventive Care for Adults  A healthy lifestyle and preventive care can promote health and wellness. Preventive health guidelines for adults include the following key practices.  . A routine yearly physical is a good way to check with your health care provider about your health and preventive screening. It is a chance to share any concerns and updates on your health and to receive a thorough exam.  . Visit your dentist for a routine exam and preventive care every 6 months. Brush your teeth twice a day and floss once a day. Good oral hygiene prevents tooth decay and gum disease.  . The frequency of eye exams is based on your age, health, family medical history, use  of contact lenses, and other factors. Follow your health care provider's recommendations for frequency of eye exams.  . Eat a healthy diet. Foods like vegetables, fruits, whole grains, low-fat dairy products, and lean protein foods contain the nutrients you need without too many calories. Decrease your intake of foods high in solid fats, added sugars, and salt. Eat the right amount of calories for you. Get information about a proper diet from your health care provider, if necessary.  . Regular physical exercise is one of the most important  things you can do for your health. Most adults should get at least 150 minutes of moderate-intensity exercise (any activity that increases your heart rate and causes you to sweat) each week. In addition, most adults need muscle-strengthening exercises on 2 or more days a week.  [employer] may be a benefit available to you. To determine eligibility, you may visit the website: www.silversne[employer].com or contact program at [phone] Mon-Fri between 8AM-8PM.   . Maintain a healthy weight. The body mass index (BMI) is a screening tool to identify possible weight problems. It provides an estimate of body fat based on height and weight. Your health care provider can find your BMI and can help you achieve or maintain a healthy weight.   For adults 20 years and older: ? A BMI below 18.5 is considered underweight. ? A BMI of 18.5 to 24.9 is normal. ? A BMI of 25 to 29.9 is considered overweight. ? A BMI of 30 and above is considered obese.   . Maintain normal blood lipids and cholesterol levels by exercising and minimizing your intake of saturated fat. Eat a balanced diet with plenty of fruit and vegetables. Blood tests for lipids and cholesterol should begin at age 24 and be repeated every 5 years. If your lipid or cholesterol levels are high, you are over 50, or you are at high risk for heart disease, you may need your cholesterol levels checked more frequently.  Ongoing high lipid and cholesterol levels should be treated with medicines if diet and exercise are not working.  . If you smoke, find out from your health care provider how to quit. If you do not use tobacco, please do not start.  . If you choose to drink alcohol, please do not consume more than 2 drinks per day. One drink is considered to be 12 ounces (355 mL) of beer, 5 ounces (148 mL) of wine, or 1.5 ounces (44 mL) of liquor.  . If you are 17-94 years old, ask your health care provider if you should take aspirin to prevent  strokes.  . Use sunscreen. Apply sunscreen liberally and repeatedly throughout the day. You should seek shade when your shadow is shorter than you. Protect yourself by wearing long sleeves, pants, a wide-brimmed hat, and sunglasses year round, whenever you are outdoors.  . Once a month, do a whole body skin exam, using a mirror to look at the skin on your back. Tell your health care provider of new moles, moles that have irregular borders, moles that are larger than a pencil eraser, or moles that have changed in shape or color.

## 2018-07-26 NOTE — Progress Notes (Signed)
Subjective:   Sandra Craig is a 77 y.o. female who presents for Medicare Annual (Subsequent) preventive examination.  Review of Systems:  No ROS.  Medicare Wellness Visit. Additional risk factors are reflected in the social history. Cardiac Risk Factors include: advanced age (>66men, >48 women) Patient lives in a single story home with her toy poodle Abby. Patient enjoys reading, watching tv shows (documentaries, Netflix movies), spends a lot of time with her family. She is a member of [employer].  Patient goes to bed around 10:30. She normally gets up around 4am to go to the bathroom every night. She normally get sup around 8 or 8:30. She feels rested when she wakes up.    Objective:     Vitals: BP 112/78 (BP Location: Left Arm, Patient Position: Sitting, Cuff Size: Large)   Pulse 68   Temp 97.9 F (36.6 C) (Oral)   Ht 5\' 6"  (1.676 m)   Wt 138 lb 6.4 oz (62.8 kg)   LMP 08/25/1992   SpO2 96%   BMI 22.34 kg/m   Body mass index is 22.34 kg/m.  Advanced Directives 07/26/2018 06/02/2017 11/06/2015 08/17/2015 03/16/2015 01/28/2015 01/21/2015  Does Patient Have a Medical Advance Directive? Yes Yes Yes Yes Yes Yes Yes  Type of Paramedic of [place];Living will - [place];Living will [place];Living will Living will;Healthcare Power of Attorney Living will;Advance instruction for mental health treatment [place];Living will  Does patient want to make changes to medical advance directive? No - Patient declined - - - - - -  Copy of [place] in Chart? No - copy requested - No - copy requested - - Yes -  Pre-existing out of facility DNR order (yellow form or pink MOST form) - - - - - - -    Tobacco Social History   Tobacco Use  Smoking Status Former Smoker  . Packs/day: 0.75  . Years: 8.00  . Pack years: 6.00  . Types: Cigarettes  . Last attempt to quit: 08/01/1976  . Years  since quitting: 42.0  Smokeless Tobacco Never Used  Tobacco Comment   Quit in 1978     Counseling given: Not Answered Comment: Quit in 1978     Past Medical History:  Diagnosis Date  . 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 Chronic insomnia   . Cystitis   . Diverticulitis of intestine without perforation or abscess without bleeding    Patient did have abscess but noperforation  . Diverticulosis of colon (without mention of hemorrhage)   . Endometriosis   . Family history of malignant neoplasm of gastrointestinal tract   . Fibromyalgia   . Gastritis   . Hiatal hernia   . HIATAL HERNIA 10/08/2008   Qualifier: Diagnosis of  By: Nils Pyle CMA ([place]), Mearl Latin    . History of gallstones   . Hyperlipidemia   . Hypertension   . IBS (irritable bowel syndrome)   . IC (interstitial cystitis)   . Internal hemorrhoid   . Osteonecrosis ([place])   . Osteopenia   . Osteoporosis   . Palpitations   . Polycystic kidney disease   . Pulmonary nodule 12/08   5 mm Anterior RUL  . Small bowel obstruction ([place])   . Stroke [facility])    Past Surgical History:  Procedure Laterality Date  . ABDOMINAL HYSTERECTOMY  1994   TAH,BSO FOR ENDOMETRIOSIS  . ABDOMINAL SURGERY  2011   small intestine blockage  .  CHOLECYSTECTOMY    . GASTROPLASTY  2011   small bowel resection -open  . JOINT REPLACEMENT  2008  . OOPHORECTOMY  1994   TAH,BSO  . PELVIC LAPAROSCOPY    . S/P right shoulder rotater cuff  200216/2011   Tear/adhesive capsulitis  . SBO Lap  11   Adhesions and small internal hernia  . SHOULDER HEMI-ARTHROPLASTY  06/05/2012   Procedure: SHOULDER HEMI-ARTHROPLASTY;  Surgeon: Johnny Bridge, MD;  Location: [place];  Service: Orthopedics;  Laterality: Right;  FOR ARTHRITIS  . TOTAL HIP ARTHROPLASTY  FALL OF 2008   rt. partial hip replacement  . TOTAL SHOULDER ARTHROPLASTY  06/05/2012   Procedure: TOTAL SHOULDER ARTHROPLASTY;  Surgeon: Johnny Bridge, MD;  Location: [place];  Service:  Orthopedics;  Laterality: Right;  RIGHT SHOULDER TOTAL ARTHROPLASTY, HEMIARTHROPLASTY, SHOULDER, FOR ARTHRITIS  . VIDEO BRONCHOSCOPY Bilateral 01/28/2015   Procedure: VIDEO BRONCHOSCOPY WITH FLUORO;  Surgeon: Collene Gobble, MD;  Location: [place];  Service: Cardiopulmonary;  Laterality: Bilateral;   Family History  Problem Relation Age of Onset  . Heart disease Mother        MI at age 102  . Emphysema Mother   . Thyroid disease Mother        Thyroidectomy/Benign  . Lymphoma Maternal Grandmother   . Colon cancer Paternal Grandmother   . COPD Father   . Cancer Brother        adenocarcinoma right lung  . COPD Brother   . Heart attack Paternal Grandfather    Social History   Socioeconomic History  . Marital status: Widowed    Spouse name: Not on file  . Number of children: Not on file  . Years of education: Not on file  . Highest education level: Not on file  Occupational History  . Occupation: Retired    Comment: Manufacturing engineer  Social Needs  . Financial resource strain: Not on file  . Food insecurity:    Worry: Not on file    Inability: Not on file  . Transportation needs:    Medical: Not on file    Non-medical: Not on file  Tobacco Use  . Smoking status: Former Smoker    Packs/day: 0.75    Years: 8.00    Pack years: 6.00    Types: Cigarettes    Last attempt to quit: 08/01/1976    Years since quitting: 42.0  . Smokeless tobacco: Never Used  . Tobacco comment: Quit in 1978  Substance and Sexual Activity  . Alcohol use: No    Alcohol/week: 0.0 standard drinks  . Drug use: No  . Sexual activity: Yes  Lifestyle  . Physical activity:    Days per week: Not on file    Minutes per session: Not on file  . Stress: Not on file  Relationships  . Social connections:    Talks on phone: Not on file    Gets together: Not on file    Attends religious service: Not on file    Active member of club or organization: Not on file    Attends meetings of clubs or  organizations: Not on file    Relationship status: Not on file  Other Topics Concern  . Not on file  Social History Narrative  . Not on file    Outpatient Encounter Medications as of 07/26/2018  Medication Sig  . amLODipine (NORVASC) 2.5 MG tablet Take 1 tablet (2.5 mg total) by mouth daily.  Marland Kitchen aspirin 81 MG tablet Take  1 tablet (81 mg total) by mouth daily.  . DULoxetine (CYMBALTA) 30 MG capsule Take 1 capsule (30 mg total) by mouth 2 (two) times daily.  . famotidine (PEPCID) 20 MG tablet Take 1 tablet (20 mg total) by mouth 2 (two) times daily.  Marland Kitchen gabapentin (NEURONTIN) 300 MG capsule TAKE 1 CAPSULE(300 MG) BY MOUTH TWICE DAILY AS NEEDED  . Polyethyl Glycol-Propyl Glycol (SYSTANE) 0.4-0.3 % GEL ophthalmic gel Place 1 application into both eyes 3 (three) times daily as needed (dry eye).  . rosuvastatin (CRESTOR) 20 MG tablet Take 1 tablet (20 mg total) by mouth daily.  Marland Kitchen zolpidem (AMBIEN) 5 MG tablet Take 1 tablet (5 mg total) by mouth at bedtime as needed. for sleep   No facility-administered encounter medications on file as of 07/26/2018.     Activities of Daily Living In your present state of health, do you have any difficulty performing the following activities: 07/26/2018  Hearing? N  Vision? N  Difficulty concentrating or making decisions? N  Walking or climbing stairs? N  Dressing or bathing? N  Doing errands, shopping? N  Preparing Food and eating ? N  Using the Toilet? N  In the past six months, have you accidently leaked urine? N  Do you have problems with loss of bowel control? N  Managing your Medications? N  Managing your Finances? N  Housekeeping or managing your Housekeeping? N  Some recent data might be hidden    Patient Care Team: [name], MD as PCP - General (Family Medicine)    Assessment:   This is a routine wellness examination for [place].  Exercise Activities and Dietary recommendations Current Exercise Habits: The patient does not  participate in regular exercise at present(Will join [employer] Jan 2020), Exercise limited by: None identified   Breakfast: Aussie Bites, Cheese and a chopped boiled egg, coffee to drink with half and half  Lunch: sandwich, salad, fruit, water to drink  Dinner: She batch cooks so normally something like chicken or chili with fruit and tea to drink  Not a snacker Goals    . Patient Stated (pt-stated)     Patient will join [employer] after the first of the 2020 year. She will work out at [employer].       Fall Risk Fall Risk  07/26/2018 01/23/2018 02/09/2017 12/29/2015 02/03/2015  Falls in the past year? 0 No No No No     Depression Screen PHQ 2/9 Scores 07/26/2018 01/30/2018 01/11/2018 02/09/2017  PHQ - 2 Score 0 0 0 6  PHQ- 9 Score 0 0 0 20     Cognitive Function MMSE - Mini Mental State Exam 07/26/2018  Orientation to time 5  Orientation to Place 5  Registration 3  Attention/ Calculation 5  Recall 3  Language- name 2 objects 2  Language- repeat 1  Language- follow 3 step command 3  Language- read & follow direction 1  Write a sentence 1  Copy design 1  Total score 30        Immunization History  Administered Date(s) Administered  . Influenza Split 05/23/2012  . Influenza Whole 05/31/2006, 05/28/2007, 05/14/2008, 04/22/2009, 05/01/2010, 04/12/2011  . Influenza, High Dose Seasonal PF 05/19/2013, 03/26/2014, 04/21/2016, 04/21/2017, 04/21/2018  . Influenza,inj,Quad PF,6+ Mos 04/15/2015  . Pneumococcal Conjugate-13 02/03/2015  . Pneumococcal Polysaccharide-23 05/31/2006, 06/07/2012  . Td 12/21/2009  . Zoster 02/06/2006      Screening Tests Health Maintenance  Topic Date Due  . MAMMOGRAM  01/12/2019  . TETANUS/TDAP  12/22/2019  . INFLUENZA VACCINE  Completed  . DEXA SCAN  Completed  . PNA vac Low Risk Adult  Completed         Plan:    Follow Up with PCP as Advised   I have personally reviewed and noted the following in the patient's chart:    . Medical and social history . Use of alcohol, tobacco or illicit drugs  . Current medications and supplements . Functional ability and status . Nutritional status . Physical activity . Advanced directives . List of other physicians . Vitals . Screenings to include cognitive, depression, and falls . Referrals and appointments  In addition, I have reviewed and discussed with patient certain preventive protocols, quality metrics, and best practice recommendations. A written personalized care plan for preventive services as well as general preventive health recommendations were provided to patient.     [place], [place]  [id number]

## 2018-07-26 NOTE — Progress Notes (Signed)
PCP notes: Last OV 07/18/2018   Health maintenance: Up to Date   Abnormal screenings: None   Patient concerns: None   Nurse concerns: None   Next PCP appt: Schedule as Needed

## 2018-08-03 ENCOUNTER — Ambulatory Visit: Payer: Medicare Other | Admitting: Obstetrics & Gynecology

## 2018-08-06 ENCOUNTER — Ambulatory Visit: Payer: Medicare Other | Admitting: Adult Health

## 2018-08-06 ENCOUNTER — Ambulatory Visit: Payer: Medicare Other | Admitting: Obstetrics & Gynecology

## 2018-08-09 ENCOUNTER — Ambulatory Visit: Payer: Medicare Other | Admitting: Family Medicine

## 2018-08-09 ENCOUNTER — Encounter

## 2018-08-17 ENCOUNTER — Ambulatory Visit: Payer: Medicare Other | Admitting: Obstetrics & Gynecology

## 2018-08-17 ENCOUNTER — Ambulatory Visit: Payer: Self-pay | Admitting: Dietician

## 2018-08-20 ENCOUNTER — Telehealth: Payer: Self-pay | Admitting: Family Medicine

## 2018-08-20 DIAGNOSIS — N390 Urinary tract infection, site not specified: Secondary | ICD-10-CM | POA: Diagnosis not present

## 2018-08-20 NOTE — Telephone Encounter (Signed)
Called patient and left a voicemail message advising that she seek care in [place]. She needs to provide a urine specimen and get a culture to ensure the proper antibiotic is given. I provided a call back number for questions

## 2018-08-20 NOTE — Telephone Encounter (Signed)
Pt somehow received backline number and called to inform she is in [facility] with a "bad UTI" and would like to know if [facility] can send in medication without her going to a doctor. Please advise.   Pt stated there is a [facility] at [phone] Alcus Dad Darreld Mclean. [address] [place] [address] P: [phone]

## 2018-08-25 DIAGNOSIS — S233XXA Sprain of ligaments of thoracic spine, initial encounter: Secondary | ICD-10-CM | POA: Diagnosis not present

## 2018-08-31 ENCOUNTER — Telehealth: Payer: Self-pay | Admitting: *Deleted

## 2018-08-31 ENCOUNTER — Encounter: Payer: Self-pay | Admitting: Obstetrics & Gynecology

## 2018-08-31 ENCOUNTER — Ambulatory Visit: Payer: Medicare Other | Admitting: Obstetrics & Gynecology

## 2018-08-31 VITALS — BP 128/76

## 2018-08-31 DIAGNOSIS — M8589 Other specified disorders of bone density and structure, multiple sites: Secondary | ICD-10-CM | POA: Diagnosis not present

## 2018-08-31 DIAGNOSIS — Z8781 Personal history of (healed) traumatic fracture: Secondary | ICD-10-CM | POA: Diagnosis not present

## 2018-08-31 NOTE — Progress Notes (Signed)
    DOMIQUE REARDON 04/11/1941 [id number]        78 y.o.  G1P0010   RP: Counseling on bone density results in the context of history of fragility fractures  HPI: Patient has had fragility fractures involving the right shoulder and the right hip at age 76.  Was on Prolia but stopped and is not on any treatment currently.  Mother with osteoporosis.   OB History  Gravida Para Term Preterm AB Living  1 0     1 0  SAB TAB Ectopic Multiple Live Births  1            # Outcome Date GA Lbr Len/2nd Weight Sex Delivery Anes PTL Lv  1 SAB             Past medical history,surgical history, problem list, medications, allergies, family history and social history were all reviewed and documented in the EPIC chart.   Directed ROS with pertinent positives and negatives documented in the history of present illness/assessment and plan.  Exam:  Vitals:   08/31/18 1046  BP: 128/76   General appearance:  Normal  Bone Density 07/19/2018: Osteopenia but very close to osteoporosis with a T score of -2.4 at the spine.  All sites are significantly decreased compared to 2016 including the spine, the hip and the forearm.   Assessment/Plan:  78 y.o. G1P0010   1. History of hip fracture Given the patient's history of fragility fractures at the right shoulder and right hip, she has a clinical diagnosis of osteoporosis.  She was previously on Prolia and off of it her bone density shows significant decrease in density at all sites.  Her lowest T score is at the spine at -2.4.  I strongly recommend restarting on Prolia.  Usage, risks and benefits of Prolia reviewed with patient.  We will follow-up for injection.  Recommend vitamin D supplements, calcium intake of 1500 mg daily and regular weightbearing physical activities.  2. Osteopenia of multiple sites As above.  Counseling on above issues and coordination of care more than 50% for 25 minutes. Princess Bruins MD, 10:54 AM 08/31/2018

## 2018-08-31 NOTE — Telephone Encounter (Signed)
Prolia insurance verification has been sent awaiting Summary of benefits  

## 2018-08-31 NOTE — Telephone Encounter (Signed)
-----   Message from Princess Bruins, MD sent at 08/31/2018 11:06 AM EST ----- Regarding: Restart on Prolia H/O fragility fracture, hip and shoulder.  Recent fall.  On Reclast with Renal contraindication.  Previously on Prolia.  BD 07/19/2018 Osteopenia with T-Score -2.4 at Spine. Significant decrease in BD x 2016 at Spine and Hip.  Indicated to restart on Prolia.  Ca++ 9.2 in 07/2018.

## 2018-09-06 ENCOUNTER — Encounter: Payer: Self-pay | Admitting: Obstetrics & Gynecology

## 2018-09-06 NOTE — Patient Instructions (Addendum)
1. History of hip fracture Given the patient's history of fragility fractures at the right shoulder and right hip, she has a clinical diagnosis of osteoporosis.  She was previously on Prolia and off of it her bone density shows significant decrease in density at all sites.  Her lowest T score is at the spine at -2.4.  I strongly recommend restarting on Prolia.  Usage, risks and benefits of Prolia reviewed with patient.  We will follow-up for injection.  Recommend vitamin D supplements, calcium intake of 1500 mg daily and regular weightbearing physical activities.  2. Osteopenia of multiple sites As above.  Sandra Craig, it was a pleasure seeing you today!

## 2018-09-14 NOTE — Telephone Encounter (Addendum)
Deductible Amount Met   OOP MAX $4200 ($186mt)  Annual exam 05/07/18 [place]  Calcium  9.2           Date 07/18/18  Upcoming dental procedures   Prior Authorization needed NO   Pt estimated Cost 213   Appt 10/08/2018 @ 2:15      Coverage Details: The provider is in network for this patient's plan. Prolia is subject to a 20% co-insurance up to a $4200 out of pocket max ($130 Met). If an office visit (OV) is billed, patient will have a $50 co-pay, which includes the admin. If no OV is billed, admin will be covered at 100% of the contracted  Insurance

## 2018-09-24 ENCOUNTER — Telehealth: Payer: Self-pay | Admitting: *Deleted

## 2018-09-24 NOTE — Telephone Encounter (Signed)
Phone call, encouraged warm soaks, loose clothing, if area becomes more painful follow-up with urgent care in the area.

## 2018-09-24 NOTE — Telephone Encounter (Signed)
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 Patient said you can call her at [phone] if you want to speak with her.

## 2018-10-08 ENCOUNTER — Ambulatory Visit (INDEPENDENT_AMBULATORY_CARE_PROVIDER_SITE_OTHER): Payer: Medicare Other | Admitting: *Deleted

## 2018-10-08 DIAGNOSIS — M810 Age-related osteoporosis without current pathological fracture: Secondary | ICD-10-CM

## 2018-10-08 DIAGNOSIS — H5213 Myopia, bilateral: Secondary | ICD-10-CM | POA: Diagnosis not present

## 2018-10-08 MED ORDER — DENOSUMAB 60 MG/ML ~~LOC~~ SOSY
60.0000 mg | PREFILLED_SYRINGE | Freq: Once | SUBCUTANEOUS | Status: AC
  Administered 2018-10-08: 60 mg via SUBCUTANEOUS

## 2018-10-12 ENCOUNTER — Other Ambulatory Visit: Payer: Self-pay

## 2018-10-12 ENCOUNTER — Encounter: Payer: Self-pay | Admitting: Dietician

## 2018-10-12 ENCOUNTER — Encounter: Payer: Medicare Other | Attending: Family Medicine | Admitting: Dietician

## 2018-10-12 DIAGNOSIS — N183 Chronic kidney disease, stage 3 unspecified: Secondary | ICD-10-CM

## 2018-10-12 NOTE — Patient Instructions (Addendum)
Follow a low sodium diet  Avoid added salt  Choose fresh or frozen or canned without salt  Avoid processed meats  Ask for food to be prepared without salt when eating out  LUVO frozen meals are low in sodium Avoid any food with Phos...  in the ingredient list. Limit portion size of meat. Only restrict fluid based on MD recommendations. Potassium should be restricted when blood potassium is elevated Resources:  (recipes)  [email or web address].com  Kidney.org Calorie king app to note how much sodium is in what you are eating out. Please call for any questions.

## 2018-10-12 NOTE — Progress Notes (Signed)
  Medical Nutrition Therapy:  Appt start time: 5176 end time:  1430.   Assessment:  Primary concerns today: Patient is here today alone due to CKD stage 3.  She states that she has tried to look at web sites to know what to eat for CKD but is confused.  History includes fibromyalgia,  Osteoporosis, CKD stage 3, HTN, HLD, and tia. 07/18/2018 labs include:  GFR 34.28, BUN 23, Creatinine 1.56, Potassium 4.8.  Patient lives alone and eats out most of the time.  She is retired from the [employer].  Her husband passed away and he was the main cook.  She started eating out more and not caring for her diet much after this.  Preferred Learning Style:   No preference indicated   Learning Readiness:   Ready  MEDICATIONS: see list   DIETARY INTAKE:  24-hr recall:  B ( AM): instant oatmeal OR wasa bread, cheese, egg  Snk ( AM): none  L ( PM): skips half the time OR sandwich, chips Snk ( PM): NABS D ( PM): salad with meat or meat and vegetables Snk ( PM): [place] bread, PB and raisins OR a dessert Beverages: sweet tea, 6-8 cups water, occasional regular coke or sprite or gingerale, coffee (2 cups with half and half), 1 glass every 2 days  Usual physical activity: walks the dog twice per day for 10-15 minutes (toy poodle)  Estimated energy needs: 1800 calories 55-65 g protein  Progress Towards Goal(s):  In progress.   Nutritional Diagnosis:  NB-1.1 Food and nutrition-related knowledge deficit As related to renal diet.  As evidenced by patient report.    Intervention:  Nutrition education related to diet for stage 3 CKD.  Discussed following a low sodium diet, avoid any foods with phos... in the ingredients and limit animal protein.  She is also to avoid Salt substitutes and No salt but no potassium restriction at this time as her potassium lab is WNL. Avoid dark soda as well due to phosphorous. Discussed resources for recipes and tips for eating out.  No fluid restriction at  this time and importance of hydration.  Follow a low sodium diet  Avoid added salt  Choose fresh or frozen or canned without salt  Avoid processed meats  Ask for food to be prepared without salt when eating out  LUVO frozen meals are low in sodium Avoid any food with Phos...  in the ingredient list. Limit portion size of meat. Only restrict fluid based on MD recommendations. Potassium should be restricted when blood potassium is elevated Resources:  (recipes)  [email or web address].com  Kidney.org Calorie king app to note how much sodium is in what you are eating out. Please call for any questions.  Teaching Method Utilized:  Visual Auditory  Handouts given during visit include:  Kidney diet pyramid  Barriers to learning/adherence to lifestyle change: none  Demonstrated degree of understanding via:  Teach Back   Monitoring/Evaluation:  Dietary intake, exercise, and body weight prn.

## 2018-10-18 ENCOUNTER — Encounter: Payer: Self-pay | Admitting: Family Medicine

## 2018-10-24 NOTE — Telephone Encounter (Signed)
PROLIA GIVEN 10/08/2018 NEXT INJECTION 04/11/2019

## 2018-10-30 ENCOUNTER — Encounter: Payer: Self-pay | Admitting: Gynecology

## 2018-12-14 ENCOUNTER — Encounter: Payer: Self-pay | Admitting: Family Medicine

## 2018-12-14 ENCOUNTER — Ambulatory Visit (INDEPENDENT_AMBULATORY_CARE_PROVIDER_SITE_OTHER): Payer: Medicare Other | Admitting: Family Medicine

## 2018-12-14 DIAGNOSIS — N183 Chronic kidney disease, stage 3 unspecified: Secondary | ICD-10-CM

## 2018-12-14 DIAGNOSIS — N3000 Acute cystitis without hematuria: Secondary | ICD-10-CM

## 2018-12-14 MED ORDER — CEPHALEXIN 250 MG PO CAPS: 250.0000 mg | ORAL_CAPSULE | Freq: Three times a day (TID) | ORAL | 0 refills | Status: DC

## 2018-12-14 NOTE — Patient Instructions (Signed)
There are no preventive care reminders to display for this patient.  Depression screen [facility] 2/9 10/12/2018 07/26/2018 01/30/2018  Decreased Interest 0 0 0  Down, Depressed, Hopeless 0 0 0  PHQ - 2 Score 0 0 0  Altered sleeping - 0 0  Tired, decreased energy - 0 0  Change in appetite - 0 0  Feeling bad or failure about yourself  - 0 0  Trouble concentrating - 0 0  Moving slowly or fidgety/restless - 0 0  Suicidal thoughts - 0 0  PHQ-9 Score - 0 0  Difficult doing work/chores - Not difficult at all -  Some recent data might be hidden

## 2018-12-14 NOTE — Progress Notes (Signed)
Phone [phone]   Subjective:  Virtual visit via Video note. Chief complaint: Chief Complaint  Patient presents with  . Urinary Frequency    x3days   This visit type was conducted due to national recommendations for restrictions regarding the COVID-19 Pandemic (e.g. social distancing).  This format is felt to be most appropriate for this patient at this time balancing risks to patient and risks to population by having him in for in person visit.  No physical exam was performed (except for noted visual exam or audio findings with Telehealth visits).    Our team/I connected with Sandra Craig at  4:20 PM EDT by a video enabled telemedicine application (doxy.me or caregility through epic) and verified that I am speaking with the correct person using two identifiers.  Location patient: Home[place] Location provider: [facility], office Persons participating in the virtual visit:  patient  Our team/I discussed the limitations of evaluation and management by telemedicine and the availability of in person appointments. In light of current covid-19 pandemic, patient also understands that we are trying to protect them by minimizing in office contact if at all possible.  The patient expressed consent for telemedicine visit and agreed to proceed. Patient understands insurance will be billed.   ROS- see ROS below   Past Medical History-  Patient Active Problem List   Diagnosis Date Noted  . History of transient ischemic attack (TIA) 06/02/2017    Priority: High  . 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 Chronic low back pain 12/20/2013    Priority: High  . Fibromyalgia 12/05/2007    Priority: High  . CKD (chronic kidney disease), stage III ([place]) 06/02/2017    Priority: Medium  . Near syncope 03/11/2017    Priority: Medium  . History of small bowel obstruction 06/28/2011    Priority: Medium  . GERD with stricture 06/28/2011    Priority:  Medium  . Polycystic kidney disease     Priority: Medium  . Hypertension     Priority: Medium  . Hyperlipidemia     Priority: Medium  . Osteoporosis 12/21/2009    Priority: Medium  . INSOMNIA, CHRONIC 10/08/2008    Priority: Medium  . Depression 02/08/2008    Priority: Medium  . Chronic right shoulder pain 03/07/2014    Priority: Low  . Benign essential tremor 10/09/2013    Priority: Low  . Hyperglycemia 06/27/2012    Priority: Low  . Diverticulitis large intestine 02/11/2012    Priority: Low  . Irritable bowel syndrome (IBS) 06/28/2011    Priority: Low  . IC (interstitial cystitis)     Priority: Low  . Hemorrhoids 10/08/2008    Priority: Low  . 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 HEMATURIA UNSPECIFIED 07/04/2008    Priority: Low  . Lung nodules 08/09/2007    Priority: Low  . UTI (urinary tract infection) 12/15/2018  . Dysuria 02/24/2018    Medications- reviewed and updated Current Outpatient Medications  Medication Sig Dispense Refill  . amLODipine (NORVASC) 2.5 MG tablet Take 1 tablet (2.5 mg total) by mouth daily. 90 tablet 3  . aspirin 81 MG tablet Take 1 tablet (81 mg total) by mouth daily. 30 tablet   . calcium-vitamin D 250-100 MG-UNIT tablet Take 1 tablet by mouth 2 (two) times daily.    Marland Kitchen denosumab (PROLIA) 60 MG/ML SOSY injection Inject 60 mg into the skin every 6 (six) months.    Marland Kitchen  DULoxetine (CYMBALTA) 30 MG capsule Take 1 capsule (30 mg total) by mouth 2 (two) times daily. 180 capsule 1  . famotidine (PEPCID) 20 MG tablet Take 1 tablet (20 mg total) by mouth 2 (two) times daily. 180 tablet 3  . gabapentin (NEURONTIN) 300 MG capsule TAKE 1 CAPSULE(300 MG) BY MOUTH TWICE DAILY AS NEEDED 180 capsule 3  . Polyethyl Glycol-Propyl Glycol (SYSTANE) 0.4-0.3 % GEL ophthalmic gel Place 1 application into both eyes 3 (three) times daily as needed (dry eye).    . rosuvastatin (CRESTOR) 20 MG tablet Take 1 tablet (20 mg total)  by mouth daily. 90 tablet 3  . zolpidem (AMBIEN) 5 MG tablet Take 1 tablet (5 mg total) by mouth at bedtime as needed. for sleep 90 tablet 1  . cephALEXin (KEFLEX) 250 MG capsule Take 1 capsule (250 mg total) by mouth 3 (three) times daily. 10 capsule 0   No current facility-administered medications for this visit.      Objective:  LMP 08/25/1992  self reported vitals Gen: NAD, resting comfortably Lungs: nonlabored, normal respiratory rate  Skin: appears dry, no obvious rash Points to lower abdomen and groin as source of discomfort     Assessment and Plan   #Concern for UTI S: Patients symptoms started yesterday morning.   UTI 01/30/18 (urine culture confirmed) and 02/24/18 (empiric) and 08/20/2018 in [place] (urine culture confirmed)  Complains of dysuria: yes- significant; polyuria: yes; nocturia: yes; urgency: yes. Lower abdominal pain  Symptoms are stable after starting AZO.   Intermittent sex- 2 last year could not have been related. January and this one possibly related to sex. Digital or oral  Sex due to a limitation from partner ROS- no fever, chills, nausea, vomiting, flank pain (none new). No blood in urine.  A/P: UA not able to be completed due to virtual visit (ideally staff would help obtain these ahead of time if possible in future). Likely UTI based on symptoms. Told patient would typically get culture given her age- under this circumstance (empiric treatment with keflex 250mg  TID - with 7 days to allow 9 pills left for below use- due to CKD stage III)- patient agrees to drop off urine next week if symptoms persist or to seek care over weekend if any systemic symptoms.  Patient to follow up if new or worsening symptoms or failure to improve.  -limited options on medications due to lungs/MAI (nitrofurantoin) and allergies (bactrim)  UTI (urinary tract infection) Last 2 UTI's appear to be after sex- Keflex 250mg  postcoital starting 5/020- should have #9 pills to use- can  refill if needed  Future Appointments  Date Time Provider [place]  05/13/2019 11:00 AM Huel Cote, NP [facility]  08/01/2019  1:00 PM [facility] HEALTH COACH [facility]   Lab/Order associations: CKD (chronic kidney disease), stage III ([place])  Acute cystitis without hematuria  Meds ordered this encounter  Medications  . cephALEXin (KEFLEX) 250 MG capsule    Sig: Take 1 capsule (250 mg total) by mouth 3 (three) times daily.    Dispense:  10 capsule    Refill:  0    Return precautions advised.  Garret Reddish, MD

## 2018-12-15 DIAGNOSIS — N390 Urinary tract infection, site not specified: Secondary | ICD-10-CM | POA: Insufficient documentation

## 2018-12-15 NOTE — Assessment & Plan Note (Addendum)
Last 2 UTI's appear to be after sex- Keflex 250mg  postcoital starting 5/020- should have #9 pills to use- can refill if needed

## 2018-12-18 ENCOUNTER — Encounter: Payer: Self-pay | Admitting: Family Medicine

## 2018-12-18 ENCOUNTER — Other Ambulatory Visit: Payer: Self-pay | Admitting: Family Medicine

## 2018-12-18 MED ORDER — CEPHALEXIN 250 MG PO CAPS: 250.0000 mg | ORAL_CAPSULE | Freq: Three times a day (TID) | ORAL | 0 refills | Status: DC

## 2018-12-20 DIAGNOSIS — L820 Inflamed seborrheic keratosis: Secondary | ICD-10-CM | POA: Diagnosis not present

## 2018-12-20 DIAGNOSIS — D2261 Melanocytic nevi of right upper limb, including shoulder: Secondary | ICD-10-CM | POA: Diagnosis not present

## 2018-12-20 DIAGNOSIS — B078 Other viral warts: Secondary | ICD-10-CM | POA: Diagnosis not present

## 2018-12-20 DIAGNOSIS — D2262 Melanocytic nevi of left upper limb, including shoulder: Secondary | ICD-10-CM | POA: Diagnosis not present

## 2018-12-20 DIAGNOSIS — L821 Other seborrheic keratosis: Secondary | ICD-10-CM | POA: Diagnosis not present

## 2018-12-25 ENCOUNTER — Ambulatory Visit: Payer: Medicare Other | Admitting: Primary Care

## 2019-01-07 DIAGNOSIS — M25561 Pain in right knee: Secondary | ICD-10-CM | POA: Diagnosis not present

## 2019-01-07 DIAGNOSIS — M25562 Pain in left knee: Secondary | ICD-10-CM | POA: Diagnosis not present

## 2019-01-29 ENCOUNTER — Encounter: Payer: Self-pay | Admitting: Family Medicine

## 2019-01-29 ENCOUNTER — Other Ambulatory Visit: Payer: Self-pay

## 2019-01-29 ENCOUNTER — Ambulatory Visit (INDEPENDENT_AMBULATORY_CARE_PROVIDER_SITE_OTHER): Payer: Medicare Other | Admitting: Family Medicine

## 2019-01-29 VITALS — BP 122/84 | HR 69 | Temp 97.5°F | Ht 65.5 in | Wt 144.8 lb

## 2019-01-29 DIAGNOSIS — K222 Esophageal obstruction: Secondary | ICD-10-CM

## 2019-01-29 DIAGNOSIS — M810 Age-related osteoporosis without current pathological fracture: Secondary | ICD-10-CM | POA: Diagnosis not present

## 2019-01-29 DIAGNOSIS — G4700 Insomnia, unspecified: Secondary | ICD-10-CM

## 2019-01-29 DIAGNOSIS — F325 Major depressive disorder, single episode, in full remission: Secondary | ICD-10-CM

## 2019-01-29 DIAGNOSIS — N183 Chronic kidney disease, stage 3 unspecified: Secondary | ICD-10-CM

## 2019-01-29 DIAGNOSIS — K219 Gastro-esophageal reflux disease without esophagitis: Secondary | ICD-10-CM

## 2019-01-29 DIAGNOSIS — I10 Essential (primary) hypertension: Secondary | ICD-10-CM

## 2019-01-29 DIAGNOSIS — M797 Fibromyalgia: Secondary | ICD-10-CM

## 2019-01-29 DIAGNOSIS — E785 Hyperlipidemia, unspecified: Secondary | ICD-10-CM

## 2019-01-29 DIAGNOSIS — N898 Other specified noninflammatory disorders of vagina: Secondary | ICD-10-CM | POA: Insufficient documentation

## 2019-01-29 MED ORDER — ESTROGENS, CONJUGATED 0.625 MG/GM VA CREA: 0.5000 | TOPICAL_CREAM | Freq: Every day | VAGINAL | 5 refills | Status: DC

## 2019-01-29 MED ORDER — ZOLPIDEM TARTRATE 5 MG PO TABS: 5.0000 mg | ORAL_TABLET | Freq: Every evening | ORAL | 1 refills | Status: DC | PRN

## 2019-01-29 MED ORDER — DULOXETINE HCL 30 MG PO CPEP: 30.0000 mg | ORAL_CAPSULE | Freq: Two times a day (BID) | ORAL | 1 refills | Status: DC

## 2019-01-29 NOTE — Assessment & Plan Note (Signed)
S:patient has tried multiple preparations of vaginal lubricants without success. She has read about vaginal estrogen and is very interested in this- has a great current relationship and issues with dry vagina have caused some issues in the relationship.   A/P:  Low dose  Conjugated estrogen considered 0.3 mg. Will have her use under half of 7.858 mg applicatorful for 2 weeks daily then twice a week. . She preferred cream over tablet.  - we discussed possible increased stroke and CV risk (more concerning with TIA history though may have been complicated migraine), breast cancer risk. She believes benefits outweigh risks due to importance of current relationship From up to date "Use of vaginal estrogen therapy requires caution in women with, or who are at increased risk for, estrogen-dependent tumors, which include many breast and uterine cancers. Low-dose vaginal estrogen therapy is considered a reasonable option in appropriately screened women following treatment for endometrial cancer but must be individualized in women with breast cancer."

## 2019-01-29 NOTE — Progress Notes (Signed)
Phone [phone]   Subjective:  Sandra Craig is a 78 y.o. year old very pleasant female patient who presents for/with See problem oriented charting Chief Complaint  Patient presents with  . Follow-up    Not fasting today.   . Hypertension  . Insomnia  . Gastroesophageal Reflux  . Hyperlipidemia   ROS- no chest pain. Stable shortness of breath. No leg swelling. No myalgias other than low back pain.   Past Medical History-  Patient Active Problem List   Diagnosis Date Noted  . History of transient ischemic attack (TIA) 06/02/2017    Priority: High  . 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 Chronic low back pain 12/20/2013    Priority: High  . Fibromyalgia 12/05/2007    Priority: High  . UTI (urinary tract infection) 12/15/2018    Priority: Medium  . CKD (chronic kidney disease), stage III ([place]) 06/02/2017    Priority: Medium  . Near syncope 03/11/2017    Priority: Medium  . History of small bowel obstruction 06/28/2011    Priority: Medium  . GERD with stricture 06/28/2011    Priority: Medium  . Polycystic kidney disease     Priority: Medium  . Hypertension     Priority: Medium  . Hyperlipidemia     Priority: Medium  . Osteoporosis 12/21/2009    Priority: Medium  . INSOMNIA, CHRONIC 10/08/2008    Priority: Medium  . Depression 02/08/2008    Priority: Medium  . Chronic right shoulder pain 03/07/2014    Priority: Low  . Benign essential tremor 10/09/2013    Priority: Low  . Hyperglycemia 06/27/2012    Priority: Low  . Diverticulitis large intestine 02/11/2012    Priority: Low  . Irritable bowel syndrome (IBS) 06/28/2011    Priority: Low  . IC (interstitial cystitis)     Priority: Low  . Hemorrhoids 10/08/2008    Priority: Low  . 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 HEMATURIA UNSPECIFIED 07/04/2008    Priority: Low  . Lung nodules 08/09/2007    Priority: Low   . Vaginal dryness 01/29/2019  . Major depressive disorder with single episode, in full remission ([place]) 01/29/2019    Medications- reviewed and updated Current Outpatient Medications  Medication Sig Dispense Refill  . amLODipine (NORVASC) 2.5 MG tablet Take 1 tablet (2.5 mg total) by mouth daily. 90 tablet 3  . aspirin 81 MG tablet Take 1 tablet (81 mg total) by mouth daily. 30 tablet   . calcium-vitamin D 250-100 MG-UNIT tablet Take 1 tablet by mouth 2 (two) times daily.    Marland Kitchen denosumab (PROLIA) 60 MG/ML SOSY injection Inject 60 mg into the skin every 6 (six) months.    . DULoxetine (CYMBALTA) 30 MG capsule Take 1 capsule (30 mg total) by mouth 2 (two) times daily. 180 capsule 1  . famotidine (PEPCID) 20 MG tablet Take 1 tablet (20 mg total) by mouth 2 (two) times daily. 180 tablet 3  . gabapentin (NEURONTIN) 300 MG capsule TAKE 1 CAPSULE(300 MG) BY MOUTH TWICE DAILY AS NEEDED 180 capsule 3  . Polyethyl Glycol-Propyl Glycol (SYSTANE) 0.4-0.3 % GEL ophthalmic gel Place 1 application into both eyes 3 (three) times daily as needed (dry eye).    . rosuvastatin (CRESTOR) 20 MG tablet Take 1 tablet (20 mg total) by mouth daily. 90 tablet 3  . zolpidem (AMBIEN) 5 MG tablet Take 1 tablet (5 mg total) by mouth  at bedtime as needed. for sleep 90 tablet 1  . conjugated estrogens (PREMARIN) vaginal cream Place 0.5 Applicatorfuls vaginally daily. Use daily for 2 weeks then just twice a week. No sex for 12 hours after use. 42.5 g 5   No current facility-administered medications for this visit.      Objective:  BP 122/84 (BP Location: Left Arm, Patient Position: Sitting, Cuff Size: Normal)   Pulse 69   Temp (!) 97.5 F (36.4 C) (Oral)   Ht 5' 5.5" (1.664 m)   Wt 144 lb 12.8 oz (65.7 kg)   LMP 08/25/1992   SpO2 96%   BMI 23.73 kg/m  Gen: NAD, resting comfortably CV: RRR no murmurs rubs or gallops Lungs: CTAB no crackles, wheeze, rhonchi Abdomen: soft/nontender/nondistended/normal bowel sounds.   Ext: no edema Skin: warm, dry     Assessment and Plan   #hypertension/CKD S: controlled on Amlodipine 2.5 mg daily. No side effects, no missed doses. Checks BP at home occasionally. Typically BP stays below 140/90. Denies HA, dizziness, visual changes, CP. Has SOB occasionally. Her diet has been off during the pandemic but she has been watching her salt intake. No lows lately  CKD stage II has been stable on recent checks with GFR in 30s- on chart appears back to 2016 was in 30s- and she reports CKD back to 2008 BP Readings from Last 3 Encounters:  01/29/19 122/84  08/31/18 128/76  07/26/18 112/78  A/P: Stable x 2 . Continue current medications. Update bmp today  #hyperlipidemia S:  controlled on Rosuvastatin 20 mg daily and Aspirin 81 mg daily. No side effects, no missed doses.  Lab Results  Component Value Date   CHOL 149 07/18/2018   HDL 66.70 07/18/2018   LDLCALC 69 07/18/2018   LDLDIRECT 73.0 01/11/2018   TRIG 63.0 07/18/2018   CHOLHDL 2 07/18/2018   A/P: Stable. Continue current medications.   # Fibromyalgia S:Taking Gabapentin 300 mg 1-2 x daily and Duloxetine 30 mg BID.  Controlling things reasonably well- not hurting right now.  A/P: Stable. Continue current medications.   # GERD S:Taking Famotidine - on Zantac in the past. Has noticed increased stomach pain since switching medication. Denies flatulence or belching. Pain is sometimes aching and other times sharp in epigastric area- not exertional. Has BM about once daily. Takes Colace and drinks water throughout the day.  A/P: mild poor control. Considered PPI- but concern for bone health. Will try some tums as needed in addition to famotidine BID before meals . May have to go back to PPI if recurrent strictures in future  # Insomnia/depression S:taking Zolpidem 5 mg at bedtime prn.  Sleep has been restful, 7-9 hours per night. Depression controlled on cymbalta A/P: Stable insomnia and depression in remission.  Continue current medications.   # Vaginal dryness S:patient has tried multiple preparations of vaginal lubricants without success. She has read about vaginal estrogen and is very interested in this- has a great current relationship and issues with dry vagina have caused some issues in the relationship.   A/P:  Failure of conservative lubricant trial. Low dose  Conjugated estrogen considered 0.3 mg. Will have her use under half of 8.466 mg applicatorful for 2 weeks daily then twice a week. . She preferred cream over tablet.  - we discussed possible increased stroke and CV risk (more concerning with TIA history though may have been complicated migraine), breast cancer risk. She believes benefits outweigh risks due to importance of current relationship From up to  date "Use of vaginal estrogen therapy requires caution in women with, or who are at increased risk for, estrogen-dependent tumors, which include many breast and uterine cancers. Low-dose vaginal estrogen therapy is considered a reasonable option in appropriately screened women following treatment for endometrial cancer but must be individualized in women with breast cancer."  Future Appointments  Date Time Provider [place]  05/13/2019 11:00 AM Huel Cote, NP [facility]  08/01/2019  1:00 PM [facility] HEALTH COACH [facility]   Lab/Order associations:   ICD-10-CM   1. Hyperlipidemia, unspecified hyperlipidemia type  E78.5 CBC    Comprehensive metabolic panel    LDL cholesterol, direct  2. CKD (chronic kidney disease), stage III ([facility])  N18.3   3. Essential hypertension  I10   4. Age-related osteoporosis without current pathological fracture  M81.0 VITAMIN D 25 Hydroxy (Vit-D Deficiency, Fractures)  5. Fibromyalgia  M79.7   6. GERD with stricture  K21.9    K22.2   7. INSOMNIA, CHRONIC  G47.00   8. Vaginal dryness  N89.8   9. Major depressive disorder with single episode, in full remission ([place]) Chronic F32.5     Meds  ordered this encounter  Medications  . DULoxetine (CYMBALTA) 30 MG capsule    Sig: Take 1 capsule (30 mg total) by mouth 2 (two) times daily.    Dispense:  180 capsule    Refill:  1  . zolpidem (AMBIEN) 5 MG tablet    Sig: Take 1 tablet (5 mg total) by mouth at bedtime as needed. for sleep    Dispense:  90 tablet    Refill:  1  . conjugated estrogens (PREMARIN) vaginal cream    Sig: Place 0.5 Applicatorfuls vaginally daily. Use daily for 2 weeks then just twice a week. No sex for 12 hours after use.    Dispense:  42.5 g    Refill:  5    Return precautions advised.  Garret Reddish, MD

## 2019-01-29 NOTE — Patient Instructions (Addendum)
Health Maintenance Due  Topic Date Due  . MAMMOGRAM scheduled at [facility] for 02/09/2019.  01/12/2019   Please stop by lab before you go If you do not have mychart- we will call you about results within 5 business days of [place] receiving them.  If you have mychart- we will send your results within 3 business days of [place] receiving them.  If abnormal or we want to clarify a result, we will call or mychart you to make sure you receive the message.  If you have questions or concerns or don't hear within 5-7 days, please send [place] a message or call us.   PHQ9 before she leaves please

## 2019-01-30 ENCOUNTER — Telehealth: Payer: Self-pay

## 2019-01-30 ENCOUNTER — Telehealth: Payer: Self-pay | Admitting: Family Medicine

## 2019-01-30 DIAGNOSIS — H1131 Conjunctival hemorrhage, right eye: Secondary | ICD-10-CM | POA: Diagnosis not present

## 2019-01-30 DIAGNOSIS — Z0120 Encounter for dental examination and cleaning without abnormal findings: Secondary | ICD-10-CM | POA: Diagnosis not present

## 2019-01-30 LAB — CBC
HCT: 34.4 % — ABNORMAL LOW (ref 36.0–46.0)
Hemoglobin: 11 g/dL — ABNORMAL LOW (ref 12.0–15.0)
MCHC: 31.9 g/dL (ref 30.0–36.0)
MCV: 79.8 fl (ref 78.0–100.0)
Platelets: 165 10*3/uL (ref 150.0–400.0)
RBC: 4.3 Mil/uL (ref 3.87–5.11)
RDW: 17 % — ABNORMAL HIGH (ref 11.5–15.5)
WBC: 6 10*3/uL (ref 4.0–10.5)

## 2019-01-30 LAB — COMPREHENSIVE METABOLIC PANEL
ALT: 28 U/L (ref 0–35)
AST: 29 U/L (ref 0–37)
Albumin: 4.2 g/dL (ref 3.5–5.2)
Alkaline Phosphatase: 53 U/L (ref 39–117)
BUN: 30 mg/dL — ABNORMAL HIGH (ref 6–23)
CO2: 28 mEq/L (ref 19–32)
Calcium: 9.6 mg/dL (ref 8.4–10.5)
Chloride: 105 mEq/L (ref 96–112)
Creatinine, Ser: 1.63 mg/dL — ABNORMAL HIGH (ref 0.40–1.20)
GFR: 30.61 mL/min — ABNORMAL LOW (ref >60.00)
Glucose, Bld: 87 mg/dL (ref 70–99)
Potassium: 4.7 mEq/L (ref 3.5–5.1)
Sodium: 142 mEq/L (ref 135–145)
Total Bilirubin: 0.4 mg/dL (ref 0.2–1.2)
Total Protein: 6.7 g/dL (ref 6.0–8.3)

## 2019-01-30 LAB — VITAMIN D 25 HYDROXY (VIT D DEFICIENCY, FRACTURES): VITD: 37.49 ng/mL (ref 30.00–100.00)

## 2019-01-30 LAB — LDL CHOLESTEROL, DIRECT: Direct LDL: 67 mg/dL

## 2019-01-30 NOTE — Telephone Encounter (Signed)
Sandra Olp, MD  P D[facility] Housekeeper        I sent in estrogen vaginal cream. Instruct patient to use under half applicatorful daily for 2 weeks then continue under half applicatorful twice a week (once a week if she can tolerate it)

## 2019-01-30 NOTE — Telephone Encounter (Signed)
Called pt and left VM to call the office.  

## 2019-01-30 NOTE — Telephone Encounter (Signed)
Pt called stating that she was returning call to [place] at [facility] office.  Call transferred to office.

## 2019-01-30 NOTE — Telephone Encounter (Signed)
Pt returned call and was advised. No questions at this time.

## 2019-01-31 ENCOUNTER — Encounter: Payer: Self-pay | Admitting: Family Medicine

## 2019-01-31 DIAGNOSIS — D649 Anemia, unspecified: Secondary | ICD-10-CM

## 2019-01-31 DIAGNOSIS — N183 Chronic kidney disease, stage 3 unspecified: Secondary | ICD-10-CM

## 2019-01-31 NOTE — Telephone Encounter (Signed)
See lab result note.

## 2019-01-31 NOTE — Telephone Encounter (Signed)
Called pt and r/s lab visit for 02/28/19.

## 2019-02-09 DIAGNOSIS — Z1231 Encounter for screening mammogram for malignant neoplasm of breast: Secondary | ICD-10-CM | POA: Diagnosis not present

## 2019-02-09 LAB — HM MAMMOGRAPHY

## 2019-02-11 ENCOUNTER — Telehealth: Payer: Self-pay

## 2019-02-11 ENCOUNTER — Ambulatory Visit (INDEPENDENT_AMBULATORY_CARE_PROVIDER_SITE_OTHER): Payer: Medicare Other | Admitting: Family Medicine

## 2019-02-11 ENCOUNTER — Telehealth: Payer: Self-pay | Admitting: *Deleted

## 2019-02-11 ENCOUNTER — Other Ambulatory Visit: Payer: Self-pay

## 2019-02-11 DIAGNOSIS — R1011 Right upper quadrant pain: Secondary | ICD-10-CM

## 2019-02-11 NOTE — Telephone Encounter (Signed)
Copied from [place] [id number]. Topic: General - Other >> Feb 11, 2019  1:23 PM Rainey Pines A wrote: Patient called back in regards to her appointment today at 4pm and would like callback in regards to it being virtual or to come in office. >> Feb 11, 2019  1:42 PM Amalia Hailey, Marcene Brawn B wrote: Patient called back to speak to [facility] due to an appointment change from 1:30 to 4pm. She wants to speak with management as she is confused and upset it was changed.

## 2019-02-11 NOTE — Telephone Encounter (Signed)
Dr. Volanda Napoleon recommends pt be seen in office. Dr. Elease Hashimoto has agreed to see pt at 4pm today.   LMTCB to advise pt needs to come to office to be seen at 4pm

## 2019-02-11 NOTE — Telephone Encounter (Signed)
Noted  

## 2019-02-11 NOTE — Progress Notes (Signed)
Patient ID: Sandra Craig, female   DOB: 01-01-1941, 78 y.o.   MRN: [id number]  This visit type was conducted due to national recommendations for restrictions regarding the COVID-19 pandemic in an effort to limit this patient's exposure and mitigate transmission in our community.   Virtual Visit via Video Note  I connected with Vincente Liberty on 02/11/19 at  4:00 PM EDT by a video enabled telemedicine application and verified that I am speaking with the correct person using two identifiers.  Location patient: home Location provider:work or home office Persons participating in the virtual visit: patient, provider  I discussed the limitations of evaluation and management by telemedicine and the availability of in person appointments. The patient expressed understanding and agreed to proceed.   HPI: Patient has chronic problems including history of hypertension, irritable bowel syndrome, GERD, history of interstitial cystitis, chronic kidney disease, fibromyalgia, history of small bowel obstruction, history of Mycobacterium avium intracellular area infection.    She calls with several month history of relatively mild intermittent right upper quadrant pain with radiation occasionally toward the back.  She has had previous cholecystectomy many years ago.  2 days ago she had fairly intense pain especially at night but symptoms much improved today.  She tried some heat with minimal improvement.  She had some mild nausea but no vomiting  Denies any chest pains, dyspnea, pleuritic pain, dysphagia, skin rashes, stool changes, fever, cough.  Pain level is 3 out of 10.  Denies any recent appetite change or weight loss.  In fact, she states she has gained about 10 pounds over the past few months.  Pain is better when lying on the right side.  She takes Pepcid for GERD symptoms and those symptoms are fairly stable. No recent melena.  She relates small bowel obstruction back in 2008 but none since then.  She  had recent lab work including CBC and comprehensive metabolic panel.  Mild normocytic anemia with hemoglobin 11.0.  Liver transaminases were normal.  Creatinine 1.6   ROS: See pertinent positives and negatives per HPI.  Past Medical History:  Diagnosis Date  . 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 Chronic insomnia   . Cystitis   . Diverticulitis of intestine without perforation or abscess without bleeding    Patient did have abscess but noperforation  . Diverticulosis of colon (without mention of hemorrhage)   . Endometriosis   . Family history of malignant neoplasm of gastrointestinal tract   . Fibromyalgia   . Gastritis   . Hiatal hernia   . HIATAL HERNIA 10/08/2008   Qualifier: Diagnosis of  By: Nils Pyle CMA ([place]), Mearl Latin    . History of gallstones   . Hyperlipidemia   . Hypertension   . IBS (irritable bowel syndrome)   . IC (interstitial cystitis)   . Internal hemorrhoid   . Osteonecrosis ([place])   . Osteopenia   . Osteoporosis   . Palpitations   . Polycystic kidney disease   . Pulmonary nodule 12/08   5 mm Anterior RUL  . Small bowel obstruction ([place])   . Stroke [facility])     Past Surgical History:  Procedure Laterality Date  . ABDOMINAL HYSTERECTOMY  1994   TAH,BSO FOR ENDOMETRIOSIS  . ABDOMINAL SURGERY  2011   small intestine blockage  . CHOLECYSTECTOMY    . GASTROPLASTY  2011   small bowel resection -open  . JOINT REPLACEMENT  2008  . OOPHORECTOMY  1994   TAH,BSO  . PELVIC LAPAROSCOPY    .  S/P right shoulder rotater cuff  200216/2011   Tear/adhesive capsulitis  . SBO Lap  11   Adhesions and small internal hernia  . SHOULDER HEMI-ARTHROPLASTY  06/05/2012   Procedure: SHOULDER HEMI-ARTHROPLASTY;  Surgeon: Johnny Bridge, MD;  Location: [place];  Service: Orthopedics;  Laterality: Right;  FOR ARTHRITIS  . TOTAL HIP ARTHROPLASTY  FALL OF 2008   rt. partial hip replacement  . TOTAL SHOULDER ARTHROPLASTY  06/05/2012   Procedure: TOTAL SHOULDER  ARTHROPLASTY;  Surgeon: Johnny Bridge, MD;  Location: [place];  Service: Orthopedics;  Laterality: Right;  RIGHT SHOULDER TOTAL ARTHROPLASTY, HEMIARTHROPLASTY, SHOULDER, FOR ARTHRITIS  . VIDEO BRONCHOSCOPY Bilateral 01/28/2015   Procedure: VIDEO BRONCHOSCOPY WITH FLUORO;  Surgeon: Collene Gobble, MD;  Location: [place];  Service: Cardiopulmonary;  Laterality: Bilateral;    Family History  Problem Relation Age of Onset  . Heart disease Mother        MI at age 46  . Emphysema Mother   . Thyroid disease Mother        Thyroidectomy/Benign  . Lymphoma Maternal Grandmother   . Colon cancer Paternal Grandmother   . COPD Father   . Cancer Brother        adenocarcinoma right lung  . COPD Brother   . Heart attack Paternal Grandfather     SOCIAL HX: Quit smoking 1978   Current Outpatient Medications:  .  amLODipine (NORVASC) 2.5 MG tablet, Take 1 tablet (2.5 mg total) by mouth daily., Disp: 90 tablet, Rfl: 3 .  aspirin 81 MG tablet, Take 1 tablet (81 mg total) by mouth daily., Disp: 30 tablet, Rfl:  .  calcium-vitamin D 250-100 MG-UNIT tablet, Take 1 tablet by mouth 2 (two) times daily., Disp: , Rfl:  .  conjugated estrogens (PREMARIN) vaginal cream, Place 0.5 Applicatorfuls vaginally daily. Use daily for 2 weeks then just twice a week. No sex for 12 hours after use., Disp: 42.5 g, Rfl: 5 .  denosumab (PROLIA) 60 MG/ML SOSY injection, Inject 60 mg into the skin every 6 (six) months., Disp: , Rfl:  .  DULoxetine (CYMBALTA) 30 MG capsule, Take 1 capsule (30 mg total) by mouth 2 (two) times daily., Disp: 180 capsule, Rfl: 1 .  famotidine (PEPCID) 20 MG tablet, Take 1 tablet (20 mg total) by mouth 2 (two) times daily., Disp: 180 tablet, Rfl: 3 .  gabapentin (NEURONTIN) 300 MG capsule, TAKE 1 CAPSULE(300 MG) BY MOUTH TWICE DAILY AS NEEDED, Disp: 180 capsule, Rfl: 3 .  Polyethyl Glycol-Propyl Glycol (SYSTANE) 0.4-0.3 % GEL ophthalmic gel, Place 1 application into both eyes 3 (three) times daily  as needed (dry eye)., Disp: , Rfl:  .  rosuvastatin (CRESTOR) 20 MG tablet, Take 1 tablet (20 mg total) by mouth daily., Disp: 90 tablet, Rfl: 3 .  zolpidem (AMBIEN) 5 MG tablet, Take 1 tablet (5 mg total) by mouth at bedtime as needed. for sleep, Disp: 90 tablet, Rfl: 1  EXAM:  VITALS per patient if applicable:  GENERAL: alert, oriented, appears well and in no acute distress  HEENT: atraumatic, conjunttiva clear, no obvious abnormalities on inspection of external nose and ears  NECK: normal movements of the head and neck  LUNGS: on inspection no signs of respiratory distress, breathing rate appears normal, no obvious gross SOB, gasping or wheezing  CV: no obvious cyanosis  MS: moves all visible extremities without noticeable abnormality  PSYCH/NEURO: pleasant and cooperative, no obvious depression or anxiety, speech and thought processing grossly intact  ASSESSMENT AND  PLAN:  Discussed the following assessment and plan:  Patient presents with several month history of intermittent pain right upper quadrant with history of previous cholecystectomy.  She does not have any red flags such as weight loss, fever, dyspnea.  Recent lab work as above.  We suggested the following:  -Abdominal ultrasound to further assess -May need to consider follow-up chest x-ray in case this is referred pain -Follow-up immediately for any fever, dyspnea, or other changes symptoms such as increased severity of pain -we elected not to get any labs today since recent labs of CBC and CMP on 01-29-19   I discussed the assessment and treatment plan with the patient. The patient was provided an opportunity to ask questions and all were answered. The patient agreed with the plan and demonstrated an understanding of the instructions.   The patient was advised to call back or seek an in-person evaluation if the symptoms worsen or if the condition fails to improve as anticipated.   Carolann Littler, MD

## 2019-02-11 NOTE — Telephone Encounter (Signed)
error 

## 2019-02-15 ENCOUNTER — Ambulatory Visit
Admission: RE | Admit: 2019-02-15 | Discharge: 2019-02-15 | Disposition: A | Discharge status: Home / Self Care | Payer: Medicare Other | Source: Ambulatory Visit | Attending: Family Medicine | Admitting: Family Medicine

## 2019-02-15 DIAGNOSIS — K7689 Other specified diseases of liver: Secondary | ICD-10-CM | POA: Diagnosis not present

## 2019-02-15 DIAGNOSIS — N281 Cyst of kidney, acquired: Secondary | ICD-10-CM | POA: Diagnosis not present

## 2019-02-15 DIAGNOSIS — R1011 Right upper quadrant pain: Secondary | ICD-10-CM

## 2019-02-15 DIAGNOSIS — N261 Atrophy of kidney (terminal): Secondary | ICD-10-CM | POA: Diagnosis not present

## 2019-02-15 DIAGNOSIS — Z9049 Acquired absence of other specified parts of digestive tract: Secondary | ICD-10-CM | POA: Diagnosis not present

## 2019-02-19 ENCOUNTER — Encounter: Payer: Self-pay | Admitting: Family Medicine

## 2019-02-19 NOTE — Progress Notes (Signed)
Phone [phone]   Subjective:  Sandra Craig is a 78 y.o. year old very pleasant female patient who presents for/with See problem oriented charting Chief Complaint  Patient presents with  . Abdominal Pain   ROS- no fever/chills. Some dental pain- working with endodontist- is going to have to see an Chief Financial Officer. Some fatigue.    Past Medical History-  Patient Active Problem List   Diagnosis Date Noted  . History of transient ischemic attack (TIA) 06/02/2017    Priority: High  . MAI (mycobacterium avium-intracellulare) infection ([place]) 10/10/2014    Priority: High  . Chronic low back pain 12/20/2013    Priority: High  . Fibromyalgia 12/05/2007    Priority: High  . UTI (urinary tract infection) 12/15/2018    Priority: Medium  . CKD (chronic kidney disease), stage III ([place]) 06/02/2017    Priority: Medium  . Near syncope 03/11/2017    Priority: Medium  . History of small bowel obstruction 06/28/2011    Priority: Medium  . GERD with stricture 06/28/2011    Priority: Medium  . Polycystic kidney disease     Priority: Medium  . Hypertension     Priority: Medium  . Hyperlipidemia     Priority: Medium  . Osteoporosis 12/21/2009    Priority: Medium  . INSOMNIA, CHRONIC 10/08/2008    Priority: Medium  . Depression 02/08/2008    Priority: Medium  . Chronic right shoulder pain 03/07/2014    Priority: Low  . Benign essential tremor 10/09/2013    Priority: Low  . Hyperglycemia 06/27/2012    Priority: Low  . Diverticulitis large intestine 02/11/2012    Priority: Low  . Irritable bowel syndrome (IBS) 06/28/2011    Priority: Low  . IC (interstitial cystitis)     Priority: Low  . Hemorrhoids 10/08/2008    Priority: Low  . 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 HEMATURIA UNSPECIFIED 07/04/2008    Priority: Low  . Lung nodules 08/09/2007    Priority: Low  . Vaginal dryness 01/29/2019  . Major depressive disorder with single  episode, in full remission ([place]) 01/29/2019    Medications- reviewed and updated Current Outpatient Medications  Medication Sig Dispense Refill  . amLODipine (NORVASC) 2.5 MG tablet Take 1 tablet (2.5 mg total) by mouth daily. 90 tablet 3  . aspirin 81 MG tablet Take 1 tablet (81 mg total) by mouth daily. 30 tablet   . calcium-vitamin D 250-100 MG-UNIT tablet Take 1 tablet by mouth 2 (two) times daily.    Marland Kitchen denosumab (PROLIA) 60 MG/ML SOSY injection Inject 60 mg into the skin every 6 (six) months.    . DULoxetine (CYMBALTA) 30 MG capsule Take 1 capsule (30 mg total) by mouth 2 (two) times daily. 180 capsule 1  . famotidine (PEPCID) 20 MG tablet Take 1 tablet (20 mg total) by mouth 2 (two) times daily. 180 tablet 3  . gabapentin (NEURONTIN) 300 MG capsule TAKE 1 CAPSULE(300 MG) BY MOUTH TWICE DAILY AS NEEDED 180 capsule 3  . Polyethyl Glycol-Propyl Glycol (SYSTANE) 0.4-0.3 % GEL ophthalmic gel Place 1 application into both eyes 3 (three) times daily as needed (dry eye).    . rosuvastatin (CRESTOR) 20 MG tablet Take 1 tablet (20 mg total) by mouth daily. 90 tablet 3  . zolpidem (AMBIEN) 5 MG tablet Take 1 tablet (5 mg total) by mouth at bedtime as needed. for sleep 90 tablet 1  . conjugated estrogens (PREMARIN) vaginal cream  Place 0.5 Applicatorfuls vaginally daily. Use daily for 2 weeks then just twice a week. No sex for 12 hours after use. (Patient not taking: Reported on 02/20/2019) 42.5 g 5   No current facility-administered medications for this visit.      Objective:  BP (!) 110/58   Pulse 96   Temp 98.5 F (36.9 C) (Oral)   Ht 5' 5.5" (1.664 m)   Wt 142 lb (64.4 kg)   LMP 08/25/1992   SpO2 98%   BMI 23.27 kg/m  Gen: NAD, resting comfortably CV: RRR no murmurs rubs or gallops Lungs: CTAB no crackles, wheeze, rhonchi Abdomen: soft/mildly tender RLQ and in RUQ- no pain with palpation of upper back/nondistended/normal bowel sounds. No rebound or guarding.  Ext: no edema Skin:  warm, dry Neuro: normal gait and speech    Assessment and Plan   # RUQ Pain S: Patient complains of intermittent right upper quadrant pain for several months radiating to her back several months. Had cholecystectomy several years ago.  Was seen by Dr. Elease Hashimoto 02/11/2019 for mild intermittent abd pain but at that time had worsening severe pain up to 8 out of 10. Pain occasionally radiates into her back. Occasional intense pain, worse at night. Reports occasional nausea but no vomiting. Heat provides minimal relief. Sx improve when lying on her right side. Denies appetite changes or weight loss. Hx of small bowel obstruction in 2008. Abd u/s was ordered by Dr. Elease Hashimoto which showed no acute abnormalities. Scan did show progressive bilateral renal atrophy compatible with progressive CKD. Scan also showed 2.9 cm cyst in the right lobe of the liver, 2.0 cm and 1.8 cm cysts in the right kidney and 1.3 and 1.0 cm cysts in the left kidney. No hydronephrosis noted.   Pain has improved since that visit some down to an aching 2/10 but was up to 8/10 with Dr. Elease Hashimoto. No exertional chest pain. Not short of breath more than normal.  Dr. Elease Hashimoto had suggested follow-up x-ray of the lungs to rule out referred pain if not improving.  Did fall over dog crate in February and fell onto right thoracic area-an area where her pain radiates.  Patient is also concerned about prior pulmonary nodules per her recollection- on review of last CT of chest January 20 2017- no pulmonary nodules were noted- did have stable Lymphadenopathy-also had "1. Stable appearance of the right upper lobe peribronchovascular tree-in-bud nodularity consistent with sequelae of prior atypical infection. 2. Emphysema." A/P: Right upper quadrant pain radiating to her back for several months-he is also concerned about prior lung findings- incentive chest x-ray with prior CT scan we opted for repeat CT chest without contrast.  We will update lab  work as well including CBC and CMP and lipase- if abnormal lab finding such as LFTs or elevated lipase would likely transition to CT abdomen and pelvis without contrast- unfortunately CKD stage III with GFR near 30 limits use of contrast -Doubt reflux-did refill famotidine today.  Could refer to GI if persistent issues and labs, CT chest normal and/or CT abdomen pelvis normalAs apparently has had stricture in the past  #CKD stage III S: Reviewed GFR is being in the 30s since at least 2016.  Also reviewed out ultrasound findings are not surprising based on this. A/P: Likely stable problem- update CMP today   #Anemia S: Mild anemia last labs.  Patient states she used to have to be on Slow Fe in the past-ask about updating iron levels A/P: Hopefully stable  and no iron deficiency- update CBC and iron/ferritin levels today  Recommended follow up: Wellness visit scheduled later this year Future Appointments  Date Time Provider [place]  05/13/2019 11:00 AM Huel Cote, NP [facility]  08/01/2019  1:00 PM [facility] HEALTH COACH [facility]   Lab/Order associations:   ICD-10-CM   1. Right upper quadrant pain  R10.11 CBC with Differential/Platelet    Comprehensive metabolic panel    CT Chest Wo Contrast    Lipase  2. CKD (chronic kidney disease), stage III ([facility])  N18.3   3. Anemia, unspecified type  D64.9 IBC + Ferritin  4. Localized enlarged lymph nodes  R59.0 CT Chest Wo Contrast  5. GERD with stricture  K21.9    K22.2    Meds ordered this encounter  Medications  . famotidine (PEPCID) 20 MG tablet    Sig: Take 1 tablet (20 mg total) by mouth 2 (two) times daily.    Dispense:  180 tablet    Refill:  3   Return precautions advised.  Garret Reddish, MD

## 2019-02-19 NOTE — Patient Instructions (Addendum)
Health Maintenance Due  Topic Date Due  . MAMMOGRAM Rpt was given a ROI to send to [facility] 01/12/2019   Please stop by lab before you go If you do not have mychart- we will call you about results within 5 business days of [place] receiving them.  If you have mychart- we will send your results within 3 business days of [place] receiving them.  If abnormal or we want to clarify a result, we will call or mychart you to make sure you receive the message.  If you have questions or concerns or don't hear within 5-7 days, please send [place] a message or call us.   We will call you within two weeks about your referral for CT. If you do not hear within 3 weeks, give [place] a call.   Please let us know if you have new or worsening symptoms

## 2019-02-20 ENCOUNTER — Encounter: Payer: Self-pay | Admitting: Family Medicine

## 2019-02-20 ENCOUNTER — Other Ambulatory Visit: Payer: Self-pay

## 2019-02-20 ENCOUNTER — Ambulatory Visit (INDEPENDENT_AMBULATORY_CARE_PROVIDER_SITE_OTHER): Payer: Medicare Other | Admitting: Family Medicine

## 2019-02-20 VITALS — BP 110/58 | HR 96 | Temp 98.5°F | Ht 65.5 in | Wt 142.0 lb

## 2019-02-20 DIAGNOSIS — R1011 Right upper quadrant pain: Secondary | ICD-10-CM | POA: Diagnosis not present

## 2019-02-20 DIAGNOSIS — D649 Anemia, unspecified: Secondary | ICD-10-CM

## 2019-02-20 DIAGNOSIS — R590 Localized enlarged lymph nodes: Secondary | ICD-10-CM

## 2019-02-20 DIAGNOSIS — K219 Gastro-esophageal reflux disease without esophagitis: Secondary | ICD-10-CM

## 2019-02-20 DIAGNOSIS — K222 Esophageal obstruction: Secondary | ICD-10-CM

## 2019-02-20 DIAGNOSIS — N183 Chronic kidney disease, stage 3 unspecified: Secondary | ICD-10-CM

## 2019-02-20 DIAGNOSIS — R591 Generalized enlarged lymph nodes: Secondary | ICD-10-CM

## 2019-02-20 LAB — COMPREHENSIVE METABOLIC PANEL
ALT: 38 U/L — ABNORMAL HIGH (ref 0–35)
AST: 35 U/L (ref 0–37)
Albumin: 3.9 g/dL (ref 3.5–5.2)
Alkaline Phosphatase: 54 U/L (ref 39–117)
BUN: 24 mg/dL — ABNORMAL HIGH (ref 6–23)
CO2: 27 mEq/L (ref 19–32)
Calcium: 9.4 mg/dL (ref 8.4–10.5)
Chloride: 106 mEq/L (ref 96–112)
Creatinine, Ser: 1.66 mg/dL — ABNORMAL HIGH (ref 0.40–1.20)
GFR: 29.97 mL/min — ABNORMAL LOW (ref >60.00)
Glucose, Bld: 95 mg/dL (ref 70–99)
Potassium: 3.6 mEq/L (ref 3.5–5.1)
Sodium: 143 mEq/L (ref 135–145)
Total Bilirubin: 0.4 mg/dL (ref 0.2–1.2)
Total Protein: 6.2 g/dL (ref 6.0–8.3)

## 2019-02-20 LAB — CBC WITH DIFFERENTIAL/PLATELET
Basophils Absolute: 0 10*3/uL (ref 0.0–0.1)
Basophils Relative: 0.5 % (ref 0.0–3.0)
Eosinophils Absolute: 0.1 10*3/uL (ref 0.0–0.7)
Eosinophils Relative: 2.1 % (ref 0.0–5.0)
HCT: 35.8 % — ABNORMAL LOW (ref 36.0–46.0)
Hemoglobin: 11.2 g/dL — ABNORMAL LOW (ref 12.0–15.0)
Lymphocytes Relative: 35.9 % (ref 12.0–46.0)
Lymphs Abs: 1.7 10*3/uL (ref 0.7–4.0)
MCHC: 31.4 g/dL (ref 30.0–36.0)
MCV: 81 fl (ref 78.0–100.0)
Monocytes Absolute: 0.4 10*3/uL (ref 0.1–1.0)
Monocytes Relative: 8.1 % (ref 3.0–12.0)
Neutro Abs: 2.5 10*3/uL (ref 1.4–7.7)
Neutrophils Relative %: 53.4 % (ref 43.0–77.0)
Platelets: 150 10*3/uL (ref 150.0–400.0)
RBC: 4.42 Mil/uL (ref 3.87–5.11)
RDW: 18.7 % — ABNORMAL HIGH (ref 11.5–15.5)
WBC: 4.7 10*3/uL (ref 4.0–10.5)

## 2019-02-20 LAB — IBC + FERRITIN
Ferritin: 8.6 ng/mL — ABNORMAL LOW (ref 10.0–291.0)
Iron: 62 ug/dL (ref 42–145)
Saturation Ratios: 15 % — ABNORMAL LOW (ref 20.0–50.0)
Transferrin: 295 mg/dL (ref 212.0–360.0)

## 2019-02-20 LAB — LIPASE: Lipase: 43 U/L (ref 11.0–59.0)

## 2019-02-20 MED ORDER — FAMOTIDINE 20 MG PO TABS: 20.0000 mg | ORAL_TABLET | Freq: Two times a day (BID) | ORAL | 3 refills | Status: DC

## 2019-02-22 ENCOUNTER — Other Ambulatory Visit: Payer: Self-pay

## 2019-02-22 ENCOUNTER — Telehealth: Payer: Self-pay | Admitting: Family Medicine

## 2019-02-22 DIAGNOSIS — R1011 Right upper quadrant pain: Secondary | ICD-10-CM

## 2019-02-22 NOTE — Telephone Encounter (Signed)
Spoke to pt about lab results. No further action needed!

## 2019-02-22 NOTE — Telephone Encounter (Signed)
Pt is calling Kendra back - I explained that Tillie Rung was in a room with a patient and would return her call.  She was adamant that she get a call back today.

## 2019-02-28 ENCOUNTER — Other Ambulatory Visit: Payer: Medicare Other

## 2019-03-04 ENCOUNTER — Other Ambulatory Visit: Payer: Medicare Other

## 2019-03-19 ENCOUNTER — Ambulatory Visit (INDEPENDENT_AMBULATORY_CARE_PROVIDER_SITE_OTHER)
Admission: RE | Admit: 2019-03-19 | Discharge: 2019-03-19 | Disposition: A | Discharge status: Home / Self Care | Payer: Medicare Other | Source: Ambulatory Visit | Attending: Family Medicine | Admitting: Family Medicine

## 2019-03-19 ENCOUNTER — Other Ambulatory Visit: Payer: Self-pay

## 2019-03-19 DIAGNOSIS — R1011 Right upper quadrant pain: Secondary | ICD-10-CM | POA: Diagnosis not present

## 2019-03-19 DIAGNOSIS — R590 Localized enlarged lymph nodes: Secondary | ICD-10-CM

## 2019-03-19 DIAGNOSIS — I700 Atherosclerosis of aorta: Secondary | ICD-10-CM | POA: Diagnosis not present

## 2019-03-19 DIAGNOSIS — I2510 Atherosclerotic heart disease of native coronary artery without angina pectoris: Secondary | ICD-10-CM | POA: Diagnosis not present

## 2019-03-19 NOTE — Addendum Note (Signed)
Addended by: [name] on: 03/19/2019 08:14 PM   Modules accepted: Orders

## 2019-03-20 ENCOUNTER — Other Ambulatory Visit: Payer: Self-pay | Admitting: Family Medicine

## 2019-03-20 NOTE — Addendum Note (Signed)
Addended by: [name] on: 03/20/2019 08:54 AM   Modules accepted: Orders

## 2019-03-22 ENCOUNTER — Other Ambulatory Visit: Payer: Self-pay | Admitting: *Deleted

## 2019-03-22 DIAGNOSIS — A310 Pulmonary mycobacterial infection: Secondary | ICD-10-CM

## 2019-03-22 NOTE — Progress Notes (Signed)
My chart message CT scan results and recommendations.

## 2019-03-22 NOTE — Progress Notes (Signed)
Patient came to office 03/22/19 to pick  Up sputum cups.   Nothing further needed

## 2019-03-26 ENCOUNTER — Telehealth: Payer: Self-pay | Admitting: Family Medicine

## 2019-03-26 NOTE — Telephone Encounter (Signed)
Called and spoke with patient. Per Dr Yong Channel and Dr. Merwyn Katos, PET scan has been cancelled and sputum culture ordered. Her pulmonology care has been transferred to Dr. Malvin Johns as Dr. Lake Bells is not working at the hospital. Pt verbalized understanding.   She notes that in the past she has been unable to do sputum culture because she was unable to produce enough sputum for culture. She will discuss this with Dr. Malvin Johns at her upcoming appointment.

## 2019-03-26 NOTE — Telephone Encounter (Signed)
Patient called because there has been some confusion in regards to her care plan and what appts she needs. She is requesting a call sometime today if at all possible at [phone]. Thank you!

## 2019-03-27 ENCOUNTER — Encounter (HOSPITAL_COMMUNITY): Payer: Self-pay

## 2019-03-27 ENCOUNTER — Encounter (HOSPITAL_COMMUNITY): Payer: Medicare Other

## 2019-04-01 ENCOUNTER — Telehealth: Payer: Self-pay | Admitting: Emergency Medicine

## 2019-04-01 ENCOUNTER — Encounter: Payer: Self-pay | Admitting: Family Medicine

## 2019-04-01 NOTE — Telephone Encounter (Signed)
Spoke with pt, advised her that we would keep the appt even if she was unable to provide a sputum sample.  Pt expressed understanding.  Nothing further needed at this time- will close encounter.

## 2019-04-02 ENCOUNTER — Other Ambulatory Visit: Payer: Self-pay

## 2019-04-02 ENCOUNTER — Ambulatory Visit (INDEPENDENT_AMBULATORY_CARE_PROVIDER_SITE_OTHER): Payer: Medicare Other

## 2019-04-02 ENCOUNTER — Encounter: Payer: Self-pay | Admitting: Family Medicine

## 2019-04-02 DIAGNOSIS — Z23 Encounter for immunization: Secondary | ICD-10-CM | POA: Diagnosis not present

## 2019-04-05 ENCOUNTER — Telehealth: Payer: Self-pay | Admitting: *Deleted

## 2019-04-05 NOTE — Telephone Encounter (Addendum)
Deductible Amount met  OOP MAX $4200($731mt)  [place]13/[place]  Calcium 9.4            Date 02/20/2019  Upcoming dental procedures   Prior Authorization needed NO per [employer] reference ##[id number] Pt estimated Cost $215   [place] AN[place]  Coverage Details:$ 20% ONE DOSE, $0 ADMIN FEE

## 2019-04-08 ENCOUNTER — Other Ambulatory Visit: Payer: Self-pay | Admitting: Family Medicine

## 2019-04-12 ENCOUNTER — Ambulatory Visit: Payer: Medicare Other | Admitting: Podiatry

## 2019-04-17 ENCOUNTER — Other Ambulatory Visit: Payer: Self-pay

## 2019-04-17 ENCOUNTER — Telehealth: Payer: Self-pay | Admitting: Emergency Medicine

## 2019-04-17 ENCOUNTER — Encounter: Payer: Self-pay | Admitting: Emergency Medicine

## 2019-04-17 ENCOUNTER — Ambulatory Visit: Payer: Medicare Other | Admitting: Emergency Medicine

## 2019-04-17 DIAGNOSIS — A310 Pulmonary mycobacterial infection: Secondary | ICD-10-CM | POA: Diagnosis not present

## 2019-04-17 NOTE — Assessment & Plan Note (Signed)
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  Note that a PET scan was initially ordered, currently on hold.  She does have 2 small 1 cm nodular foci but I think the risk for malignancy here is low. If MAIC positive then she is willing to be treated. Then we would follow with CT's. If the nodules evolved despite treatment then we could reconsider potential benefits of biopsy.

## 2019-04-17 NOTE — Progress Notes (Signed)
Subjective:    Patient ID: Sandra Craig, female    DOB: [birth year], 78 y.o.   MRN: [id number]  HPI 78 yo woman, former minimal smoker (7 pk-yrs) with hx colon CA, IBS, has been followed here by Drs Joya Gaskins and then [facility] for [facility] (that I dx by FOB in 12/2014). She was treated for 1 yr with multi-drug regimen, completed 2017. Has not had a positive AFB since, has difficulty producing sputum.  Last CT chest was 03/19/2019 which I reviewed, showed an enlarging posterior right upper lobe nodular cluster, approximately 1 cm in largest dimension.   She is having malaise, fatigue. She has cough, non-productive, not every day. No hemoptysis. She has a chest fullness, discomfort most of the day.    Review of Systems  Past Medical History:  Diagnosis Date  . 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 Chronic insomnia   . Cystitis   . Diverticulitis of intestine without perforation or abscess without bleeding    Patient did have abscess but noperforation  . Diverticulosis of colon (without mention of hemorrhage)   . Endometriosis   . Family history of malignant neoplasm of gastrointestinal tract   . Fibromyalgia   . Gastritis   . Hiatal hernia   . HIATAL HERNIA 10/08/2008   Qualifier: Diagnosis of  By: Nils Pyle CMA ([place]), Mearl Latin    . History of gallstones   . Hyperlipidemia   . Hypertension   . IBS (irritable bowel syndrome)   . IC (interstitial cystitis)   . Internal hemorrhoid   . Osteonecrosis ([place])   . Osteopenia   . Osteoporosis   . Palpitations   . Polycystic kidney disease   . Pulmonary nodule 12/08   5 mm Anterior RUL  . Small bowel obstruction ([place])   . Stroke [facility])      Family History  Problem Relation Age of Onset  . Heart disease Mother        MI at age 61  . Emphysema Mother   . Thyroid disease Mother        Thyroidectomy/Benign  . Lymphoma Maternal Grandmother   . Colon cancer Paternal Grandmother   . COPD Father   . Cancer Brother    adenocarcinoma right lung  . COPD Brother   . Heart attack Paternal Grandfather      Social History   Socioeconomic History  . Marital status: Widowed    Spouse name: Not on file  . Number of children: Not on file  . Years of education: Not on file  . Highest education level: Not on file  Occupational History  . Occupation: Retired    Comment: Manufacturing engineer  Social Needs  . Financial resource strain: Not on file  . Food insecurity    Worry: Not on file    Inability: Not on file  . Transportation needs    Medical: Not on file    Non-medical: Not on file  Tobacco Use  . Smoking status: Former Smoker    Packs/day: 0.75    Years: 8.00    Pack years: 6.00    Types: Cigarettes    Quit date: 08/01/1976    Years since quitting: 42.7  . Smokeless tobacco: Never Used  . Tobacco comment: Quit in 1978  Substance and Sexual Activity  . Alcohol use: No    Alcohol/week: 0.0 standard drinks  . Drug use: No  . Sexual activity: Yes  Lifestyle  . Physical activity    Days  per week: Not on file    Minutes per session: Not on file  . Stress: Not on file  Relationships  . Social Herbalist on phone: Not on file    Gets together: Not on file    Attends religious service: Not on file    Active member of club or organization: Not on file    Attends meetings of clubs or organizations: Not on file    Relationship status: Not on file  . Intimate partner violence    Fear of current or ex partner: Not on file    Emotionally abused: Not on file    Physically abused: Not on file    Forced sexual activity: Not on file  Other Topics Concern  . Not on file  Social History Narrative  . Not on file     Allergies  Allergen Reactions  . Azithromycin Rash    Pruritic rash diffuse   . Celecoxib Nausea Only  . Sulfonamide Derivatives Rash     Outpatient Medications Prior to Visit  Medication Sig Dispense Refill  . amLODipine (NORVASC) 2.5 MG tablet Take 1 tablet (2.5 mg  total) by mouth daily. 90 tablet 3  . aspirin 81 MG tablet Take 1 tablet (81 mg total) by mouth daily. 30 tablet   . calcium-vitamin D 250-100 MG-UNIT tablet Take 1 tablet by mouth 2 (two) times daily.    Marland Kitchen denosumab (PROLIA) 60 MG/ML SOSY injection Inject 60 mg into the skin every 6 (six) months.    . DULoxetine (CYMBALTA) 30 MG capsule TAKE ONE CAPSULE BY MOUTH TWICE A DAY 90 capsule 0  . famotidine (PEPCID) 20 MG tablet Take 1 tablet (20 mg total) by mouth 2 (two) times daily. 180 tablet 3  . gabapentin (NEURONTIN) 300 MG capsule TAKE 1 CAPSULE(300 MG) BY MOUTH TWICE DAILY AS NEEDED 180 capsule 3  . Polyethyl Glycol-Propyl Glycol (SYSTANE) 0.4-0.3 % GEL ophthalmic gel Place 1 application into both eyes 3 (three) times daily as needed (dry eye).    . rosuvastatin (CRESTOR) 20 MG tablet Take 1 tablet (20 mg total) by mouth daily. 90 tablet 3  . zolpidem (AMBIEN) 5 MG tablet Take 1 tablet (5 mg total) by mouth at bedtime as needed. for sleep 90 tablet 1  . conjugated estrogens (PREMARIN) vaginal cream Place 0.5 Applicatorfuls vaginally daily. Use daily for 2 weeks then just twice a week. No sex for 12 hours after use. (Patient not taking: Reported on 02/20/2019) 42.5 g 5   No facility-administered medications prior to visit.         Objective:   Physical Exam Vitals:   04/17/19 1421  BP: 130/80  Pulse: 80  SpO2: 97%  Weight: 145 lb (65.8 kg)  Height: 5\' 6"  (1.676 m)   Gen: Pleasant, well-nourished, in no distress,  normal affect  ENT: No lesions,  mouth clear,  oropharynx clear, no postnasal drip  Neck: No JVD, no stridor  Lungs: No use of accessory muscles, no crackles or wheezing on normal respiration, no wheeze on forced expiration. 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  Note that a PET scan was initially  ordered, currently on hold.  She does have 2 small 1 cm nodular foci but I think the risk for malignancy here is low. If MAIC positive then she is willing to be treated. Then we would follow with CT's. If the nodules evolved despite treatment then we could reconsider potential benefits of biopsy.   Baltazar Apo, MD, PhD 04/17/2019, 3:10 PM [facility] [phone] or if no answer [phone]

## 2019-04-17 NOTE — H&P (View-Only) (Signed)
Subjective:    Patient ID: Sandra Craig, female    DOB: [birth year], 78 y.o.   MRN: [id number]  HPI 78 yo woman, former minimal smoker (7 pk-yrs) with hx colon CA, IBS, has been followed here by Drs Joya Gaskins and then [facility] for [facility] (that I dx by FOB in 12/2014). She was treated for 1 yr with multi-drug regimen, completed 2017. Has not had a positive AFB since, has difficulty producing sputum.  Last CT chest was 03/19/2019 which I reviewed, showed an enlarging posterior right upper lobe nodular cluster, approximately 1 cm in largest dimension.   She is having malaise, fatigue. She has cough, non-productive, not every day. No hemoptysis. She has a chest fullness, discomfort most of the day.    Review of Systems  Past Medical History:  Diagnosis Date  . 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 Chronic insomnia   . Cystitis   . Diverticulitis of intestine without perforation or abscess without bleeding    Patient did have abscess but noperforation  . Diverticulosis of colon (without mention of hemorrhage)   . Endometriosis   . Family history of malignant neoplasm of gastrointestinal tract   . Fibromyalgia   . Gastritis   . Hiatal hernia   . HIATAL HERNIA 10/08/2008   Qualifier: Diagnosis of  By: Nils Pyle CMA ([place]), Mearl Latin    . History of gallstones   . Hyperlipidemia   . Hypertension   . IBS (irritable bowel syndrome)   . IC (interstitial cystitis)   . Internal hemorrhoid   . Osteonecrosis ([place])   . Osteopenia   . Osteoporosis   . Palpitations   . Polycystic kidney disease   . Pulmonary nodule 12/08   5 mm Anterior RUL  . Small bowel obstruction ([place])   . Stroke [facility])      Family History  Problem Relation Age of Onset  . Heart disease Mother        MI at age 59  . Emphysema Mother   . Thyroid disease Mother        Thyroidectomy/Benign  . Lymphoma Maternal Grandmother   . Colon cancer Paternal Grandmother   . COPD Father   . Cancer Brother    adenocarcinoma right lung  . COPD Brother   . Heart attack Paternal Grandfather      Social History   Socioeconomic History  . Marital status: Widowed    Spouse name: Not on file  . Number of children: Not on file  . Years of education: Not on file  . Highest education level: Not on file  Occupational History  . Occupation: Retired    Comment: Manufacturing engineer  Social Needs  . Financial resource strain: Not on file  . Food insecurity    Worry: Not on file    Inability: Not on file  . Transportation needs    Medical: Not on file    Non-medical: Not on file  Tobacco Use  . Smoking status: Former Smoker    Packs/day: 0.75    Years: 8.00    Pack years: 6.00    Types: Cigarettes    Quit date: 08/01/1976    Years since quitting: 42.7  . Smokeless tobacco: Never Used  . Tobacco comment: Quit in 1978  Substance and Sexual Activity  . Alcohol use: No    Alcohol/week: 0.0 standard drinks  . Drug use: No  . Sexual activity: Yes  Lifestyle  . Physical activity    Days  per week: Not on file    Minutes per session: Not on file  . Stress: Not on file  Relationships  . Social Herbalist on phone: Not on file    Gets together: Not on file    Attends religious service: Not on file    Active member of club or organization: Not on file    Attends meetings of clubs or organizations: Not on file    Relationship status: Not on file  . Intimate partner violence    Fear of current or ex partner: Not on file    Emotionally abused: Not on file    Physically abused: Not on file    Forced sexual activity: Not on file  Other Topics Concern  . Not on file  Social History Narrative  . Not on file     Allergies  Allergen Reactions  . Azithromycin Rash    Pruritic rash diffuse   . Celecoxib Nausea Only  . Sulfonamide Derivatives Rash     Outpatient Medications Prior to Visit  Medication Sig Dispense Refill  . amLODipine (NORVASC) 2.5 MG tablet Take 1 tablet (2.5 mg  total) by mouth daily. 90 tablet 3  . aspirin 81 MG tablet Take 1 tablet (81 mg total) by mouth daily. 30 tablet   . calcium-vitamin D 250-100 MG-UNIT tablet Take 1 tablet by mouth 2 (two) times daily.    Marland Kitchen denosumab (PROLIA) 60 MG/ML SOSY injection Inject 60 mg into the skin every 6 (six) months.    . DULoxetine (CYMBALTA) 30 MG capsule TAKE ONE CAPSULE BY MOUTH TWICE A DAY 90 capsule 0  . famotidine (PEPCID) 20 MG tablet Take 1 tablet (20 mg total) by mouth 2 (two) times daily. 180 tablet 3  . gabapentin (NEURONTIN) 300 MG capsule TAKE 1 CAPSULE(300 MG) BY MOUTH TWICE DAILY AS NEEDED 180 capsule 3  . Polyethyl Glycol-Propyl Glycol (SYSTANE) 0.4-0.3 % GEL ophthalmic gel Place 1 application into both eyes 3 (three) times daily as needed (dry eye).    . rosuvastatin (CRESTOR) 20 MG tablet Take 1 tablet (20 mg total) by mouth daily. 90 tablet 3  . zolpidem (AMBIEN) 5 MG tablet Take 1 tablet (5 mg total) by mouth at bedtime as needed. for sleep 90 tablet 1  . conjugated estrogens (PREMARIN) vaginal cream Place 0.5 Applicatorfuls vaginally daily. Use daily for 2 weeks then just twice a week. No sex for 12 hours after use. (Patient not taking: Reported on 02/20/2019) 42.5 g 5   No facility-administered medications prior to visit.         Objective:   Physical Exam Vitals:   04/17/19 1421  BP: 130/80  Pulse: 80  SpO2: 97%  Weight: 145 lb (65.8 kg)  Height: 5\' 6"  (1.676 m)   Gen: Pleasant, well-nourished, in no distress,  normal affect  ENT: No lesions,  mouth clear,  oropharynx clear, no postnasal drip  Neck: No JVD, no stridor  Lungs: No use of accessory muscles, no crackles or wheezing on normal respiration, no wheeze on forced expiration. 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  Note that a PET scan was initially  ordered, currently on hold.  She does have 2 small 1 cm nodular foci but I think the risk for malignancy here is low. If MAIC positive then she is willing to be treated. Then we would follow with CT's. If the nodules evolved despite treatment then we could reconsider potential benefits of biopsy.   Baltazar Apo, MD, PhD 04/17/2019, 3:10 PM [facility] [phone] or if no answer [phone]

## 2019-04-17 NOTE — Telephone Encounter (Signed)
Pt needs to be set up for COVID test. She is scheduled for a bronch on 04/23/2019. Thank you!

## 2019-04-17 NOTE — Patient Instructions (Signed)
We will arrange for bronchoscopy to evaluate your small pulmonary nodules. Follow with Dr Lamonte Sakai in 1 month

## 2019-04-17 NOTE — Telephone Encounter (Signed)
Attempted to call patient, no answer, left message to call back.  

## 2019-04-18 ENCOUNTER — Other Ambulatory Visit: Payer: Self-pay | Admitting: Emergency Medicine

## 2019-04-18 NOTE — Telephone Encounter (Signed)
Attempted to call patient, no answer, left message to call back.  

## 2019-04-18 NOTE — Telephone Encounter (Signed)
Patient called back. Scheduled for pre-procedure COVID testing.

## 2019-04-20 ENCOUNTER — Other Ambulatory Visit (HOSPITAL_COMMUNITY)
Admission: RE | Admit: 2019-04-20 | Discharge: 2019-04-20 | Disposition: A | Discharge status: Home / Self Care | Payer: Medicare Other | Source: Ambulatory Visit | Attending: Emergency Medicine | Admitting: Emergency Medicine

## 2019-04-20 DIAGNOSIS — Z20828 Contact with and (suspected) exposure to other viral communicable diseases: Secondary | ICD-10-CM | POA: Diagnosis not present

## 2019-04-20 DIAGNOSIS — Z01812 Encounter for preprocedural laboratory examination: Secondary | ICD-10-CM | POA: Diagnosis not present

## 2019-04-21 LAB — NOVEL CORONAVIRUS, NAA (HOSP ORDER, SEND-OUT TO REF LAB; TAT 18-24 HRS): SARS-CoV-2, NAA: NOT DETECTED

## 2019-04-23 ENCOUNTER — Ambulatory Visit (HOSPITAL_COMMUNITY)
Admission: RE | Admit: 2019-04-23 | Discharge: 2019-04-23 | Disposition: A | Discharge status: Home / Self Care | Payer: Medicare Other | Source: Ambulatory Visit | Attending: Emergency Medicine | Admitting: Emergency Medicine

## 2019-04-23 ENCOUNTER — Ambulatory Visit (HOSPITAL_COMMUNITY)
Admission: RE | Admit: 2019-04-23 | Discharge: 2019-04-23 | Disposition: A | Discharge status: Home / Self Care | Payer: Medicare Other | Attending: Emergency Medicine | Admitting: Emergency Medicine

## 2019-04-23 ENCOUNTER — Encounter (HOSPITAL_COMMUNITY)
Admission: RE | Disposition: A | Discharge status: Home / Self Care | Payer: Self-pay | Source: Home / Self Care | Attending: Emergency Medicine

## 2019-04-23 ENCOUNTER — Encounter (HOSPITAL_COMMUNITY): Payer: Self-pay | Admitting: Respiratory Therapy

## 2019-04-23 DIAGNOSIS — E785 Hyperlipidemia, unspecified: Secondary | ICD-10-CM | POA: Diagnosis not present

## 2019-04-23 DIAGNOSIS — R918 Other nonspecific abnormal finding of lung field: Secondary | ICD-10-CM | POA: Diagnosis not present

## 2019-04-23 DIAGNOSIS — Z79899 Other long term (current) drug therapy: Secondary | ICD-10-CM | POA: Insufficient documentation

## 2019-04-23 DIAGNOSIS — Z7982 Long term (current) use of aspirin: Secondary | ICD-10-CM | POA: Insufficient documentation

## 2019-04-23 DIAGNOSIS — Z836 Family history of other diseases of the respiratory system: Secondary | ICD-10-CM | POA: Insufficient documentation

## 2019-04-23 DIAGNOSIS — Z8673 Personal history of transient ischemic attack (TIA), and cerebral infarction without residual deficits: Secondary | ICD-10-CM | POA: Insufficient documentation

## 2019-04-23 DIAGNOSIS — I10 Essential (primary) hypertension: Secondary | ICD-10-CM | POA: Insufficient documentation

## 2019-04-23 DIAGNOSIS — Z801 Family history of malignant neoplasm of trachea, bronchus and lung: Secondary | ICD-10-CM | POA: Insufficient documentation

## 2019-04-23 DIAGNOSIS — Z87891 Personal history of nicotine dependence: Secondary | ICD-10-CM | POA: Diagnosis not present

## 2019-04-23 HISTORY — PX: VIDEO BRONCHOSCOPY: SHX5072

## 2019-04-23 LAB — BODY FLUID CELL COUNT WITH DIFFERENTIAL
Eos, Fluid: 1 %
Lymphs, Fluid: 22 %
Monocyte-Macrophage-Serous Fluid: 6 % — ABNORMAL LOW (ref 50–90)
Neutrophil Count, Fluid: 72 % — ABNORMAL HIGH (ref 0–25)
Total Nucleated Cell Count, Fluid: 700 cu mm (ref 0–1000)

## 2019-04-23 SURGERY — VIDEO BRONCHOSCOPY WITHOUT FLUORO
Anesthesia: Moderate Sedation | Laterality: Bilateral

## 2019-04-23 MED ORDER — FENTANYL CITRATE (PF) 100 MCG/2ML IJ SOLN
INTRAMUSCULAR | Status: AC
  Filled 2019-04-23: qty 4

## 2019-04-23 MED ORDER — GABAPENTIN 300 MG PO CAPS: 300.0000 mg | ORAL_CAPSULE | Freq: Two times a day (BID) | ORAL | Status: DC | PRN

## 2019-04-23 MED ORDER — LIDOCAINE HCL 1 % IJ SOLN
INTRAMUSCULAR | Status: DC | PRN
  Administered 2019-04-23: 6 mL via RESPIRATORY_TRACT

## 2019-04-23 MED ORDER — SODIUM CHLORIDE 0.9 % IV SOLN
INTRAVENOUS | Status: DC
  Administered 2019-04-23: 08:00:00 via INTRAVENOUS

## 2019-04-23 MED ORDER — MIDAZOLAM HCL (PF) 5 MG/ML IJ SOLN
INTRAMUSCULAR | Status: AC
  Filled 2019-04-23: qty 2

## 2019-04-23 MED ORDER — PHENYLEPHRINE HCL 0.25 % NA SOLN
NASAL | Status: DC | PRN
  Administered 2019-04-23: 2 via NASAL

## 2019-04-23 MED ORDER — FENTANYL CITRATE (PF) 100 MCG/2ML IJ SOLN
INTRAMUSCULAR | Status: DC | PRN
  Administered 2019-04-23: 50 ug via INTRAVENOUS
  Administered 2019-04-23: 25 ug via INTRAVENOUS

## 2019-04-23 MED ORDER — MIDAZOLAM HCL (PF) 10 MG/2ML IJ SOLN
INTRAMUSCULAR | Status: DC | PRN
  Administered 2019-04-23 (×2): 2 mg via INTRAVENOUS

## 2019-04-23 MED ORDER — LIDOCAINE HCL URETHRAL/MUCOSAL 2 % EX GEL
CUTANEOUS | Status: DC | PRN
  Administered 2019-04-23: 1

## 2019-04-23 NOTE — Op Note (Signed)
[facility] Cardiopulmonary Patient Name: Sandra Craig Pocedure Date: 04/23/2019 MRN: [id number] Attending MD: Collene Gobble , MD Date of Birth: [birth year] CSN: Finalized Age: 78 Admit Type: Outpatient Gender: Female Procedure:            Bronchoscopy Indications:          Abnormal CT scan of chest Providers:            Collene Gobble, MD, Ciro Backer RRT, RCP, Andre Lefort RRT,RCP Referring MD:          Medicines:            Midazolam 4 mg IV, Fentanyl 75 mcg IV Complications:        No immediate complications Estimated Blood Loss: Estimated blood loss: none. Procedure:            Pre-Anesthesia Assessment:                       - A History and Physical has been performed. Patient                        meds and allergies have been reviewed. The risks and                        benefits of the procedure and the sedation options and                        risks were discussed with the patient. All questions                        were answered and informed consent was obtained.                        Patient identification and proposed procedure were                        verified prior to the procedure by the physician in the                        procedure room. Mental Status Examination: alert and                        oriented. Airway Examination: normal oropharyngeal                        airway. Respiratory Examination: clear to auscultation.                        CV Examination: normal. ASA Grade Assessment: I - A                        normal healthy patient. After reviewing the risks and                        benefits, the patient was deemed in satisfactory                        condition to undergo the procedure. The anesthesia plan  was to use moderate sedation / analgesia (conscious                        sedation). Immediately prior to administration of                        medications, the  patient was re-assessed for adequacy                        to receive sedatives. The heart rate, respiratory rate,                        oxygen saturations, blood pressure, adequacy of                        pulmonary ventilation, and response to care were                        monitored throughout the procedure. The physical status                        of the patient was re-assessed after the procedure.                       After obtaining informed consent, the bronchoscope was                        passed under direct vision. Throughout the procedure,                        the patient's blood pressure, pulse, and oxygen                        saturations were monitored continuously. the BF-H190                        ([id number]) Olympus Diagnostic Bronchoscope was                        introduced through the right nostril and advanced to                        the tracheobronchial tree. The procedure was                        accomplished without difficulty. The patient tolerated                        the procedure well. The total duration of the procedure                        was 20 minutes. Scope In: 8:06:02 AM Scope Out: 8:16:05 AM Findings:      The nasopharynx/oropharynx appears normal. The larynx appears normal.       The vocal cords appear normal. The subglottic space is normal. The       trachea is of normal caliber. The carina is sharp. The tracheobronchial       tree was examined to at least the first subsegmental level. Bronchial       mucosa and anatomy are normal with the exception of  some distortion of       the RML and RLL airways. All were widely patent; there are no       endobronchial lesions, and no secretions.      Bronchoalveolar lavage was performed in the RUL apical segment (B1) of       the lung and sent for cell count, bacterial culture, viral smears &       culture, and fungal & AFB analysis and cytology. 120 mL of fluid were       instilled. 14 mL  were returned. The return was cloudy. Multiple       specimens were obtained and pooled into one specimen, which was sent for       analysis. Impression:           - Abnormal CT scan of chest                       - The airway examination was normal.                       - Bronchoalveolar lavage was performed. Moderate Sedation:      Moderate (conscious) sedation was personally administered by the       pulmonologist. The following parameters were monitored: oxygen       saturation, heart rate, blood pressure, respiratory rate, EKG, adequacy       of pulmonary ventilation, and response to care. Total physician       intraservice time was 20 minutes. Recommendation:       - Await culture and cytology results. Procedure Code(s):    --- Professional ---                       [phone], Bronchoscopy, rigid or flexible, including                        fluoroscopic guidance, when performed; with bronchial                        alveolar lavage                       99152, Moderate sedation services provided by the same                        physician or other qualified health care professional                        performing the diagnostic or therapeutic service that                        the sedation supports, requiring the presence of an                        independent trained observer to assist in the                        monitoring of the patient's level of consciousness and                        physiological status; initial 15 minutes of  intraservice time, patient age 68 years or older Diagnosis Code(s):    --- Professional ---                       R93.89, Abnormal findings on diagnostic imaging of                        other specified body structures CPT copyright 2019 American Medical Association. All rights reserved. The codes documented in this report are preliminary and upon coder review may  be revised to meet current compliance  requirements. Collene Gobble, MD Collene Gobble, MD 04/23/2019 8:32:28 AM Number of Addenda: 0

## 2019-04-23 NOTE — Progress Notes (Signed)
Video Bronchoscopy performed  Intervention Bronchial Washing

## 2019-04-23 NOTE — Interval H&P Note (Signed)
PCCM interval note.  Patient presents today for further evaluation of multifocal nodular disease by CT scan of the chest.  She has a history of MAIC.  Plan is for bronchoscopy with BAL, culture data, cytology.  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  Procedure explained, risks, benefits discussed.  All questions answered.  Patient agrees to proceed.  No barriers identified.  Baltazar Apo, MD, PhD 04/23/2019, 8:00 AM [place] [facility] [phone] or if no answer [phone]

## 2019-04-23 NOTE — Discharge Instructions (Signed)
Flexible Bronchoscopy, Care After This sheet gives you information about how to care for yourself after your test. Your doctor may also give you more specific instructions. If you have problems or questions, contact your doctor. Follow these instructions at home: Eating and drinking  Do not eat or drink anything (not even water) for 2 hours after your test, or until your numbing medicine (local anesthetic) wears off.  When your numbness is gone and your cough and gag reflexes have come back, you may: ? Eat only soft foods. ? Slowly drink liquids.  The day after the test, go back to your normal diet. Driving  Do not drive for 24 hours if you were given a medicine to help you relax (sedative).  Do not drive or use heavy machinery while taking prescription pain medicine. General instructions   Take over-the-counter and prescription medicines only as told by your doctor.  Return to your normal activities as told. Ask what activities are safe for you.  Do not use any products that have nicotine or tobacco in them. This includes cigarettes and e-cigarettes. If you need help quitting, ask your doctor.  Keep all follow-up visits as told by your doctor. This is important. It is very important if you had a tissue sample (biopsy) taken. Get help right away if:  You have shortness of breath that gets worse.  You get light-headed.  You feel like you are going to pass out (faint).  You have chest pain.  You cough up: ? More than a little blood. ? More blood than before. Summary  Do not eat or drink anything (not even water) for 2 hours after your test, or until your numbing medicine wears off.  Do not use cigarettes. Do not use e-cigarettes.  Get help right away if you have chest pain. This information is not intended to replace advice given to you by your health care provider. Make sure you discuss any questions you have with your health care provider. Document Released: 05/15/2009  Document Revised: 06/30/2017 Document Reviewed: 08/05/2016 Elsevier Patient Education  2020 [employer].  Nothing to eat or drink until 10:30 AM on 04/23/2019  Any questions or concerns please  call office [phone]

## 2019-04-24 ENCOUNTER — Encounter (HOSPITAL_COMMUNITY): Payer: Self-pay | Admitting: Emergency Medicine

## 2019-04-24 LAB — CYTOLOGY - NON PAP

## 2019-04-24 LAB — ACID FAST SMEAR (AFB, MYCOBACTERIA): Acid Fast Smear: NEGATIVE

## 2019-04-25 LAB — CULTURE, BAL-QUANTITATIVE W GRAM STAIN: Culture: 80000 — AB

## 2019-05-07 ENCOUNTER — Telehealth: Payer: Self-pay | Admitting: Emergency Medicine

## 2019-05-07 NOTE — Telephone Encounter (Signed)
Notified today that BAL results show MAIC. Will call her to review

## 2019-05-08 ENCOUNTER — Other Ambulatory Visit: Payer: Self-pay

## 2019-05-08 ENCOUNTER — Other Ambulatory Visit: Payer: Medicare Other

## 2019-05-08 ENCOUNTER — Other Ambulatory Visit (INDEPENDENT_AMBULATORY_CARE_PROVIDER_SITE_OTHER): Payer: Medicare Other

## 2019-05-08 DIAGNOSIS — R1031 Right lower quadrant pain: Secondary | ICD-10-CM

## 2019-05-08 DIAGNOSIS — D649 Anemia, unspecified: Secondary | ICD-10-CM

## 2019-05-08 LAB — CBC WITH DIFFERENTIAL/PLATELET
Basophils Absolute: 0.1 10*3/uL (ref 0.0–0.1)
Basophils Relative: 0.8 % (ref 0.0–3.0)
Eosinophils Absolute: 0.2 10*3/uL (ref 0.0–0.7)
Eosinophils Relative: 2.5 % (ref 0.0–5.0)
HCT: 36.1 % (ref 36.0–46.0)
Hemoglobin: 11.6 g/dL — ABNORMAL LOW (ref 12.0–15.0)
Lymphocytes Relative: 24.8 % (ref 12.0–46.0)
Lymphs Abs: 1.6 10*3/uL (ref 0.7–4.0)
MCHC: 32.2 g/dL (ref 30.0–36.0)
MCV: 85 fl (ref 78.0–100.0)
Monocytes Absolute: 0.3 10*3/uL (ref 0.1–1.0)
Monocytes Relative: 5.2 % (ref 3.0–12.0)
Neutro Abs: 4.3 10*3/uL (ref 1.4–7.7)
Neutrophils Relative %: 66.7 % (ref 43.0–77.0)
Platelets: 175 10*3/uL (ref 150.0–400.0)
RBC: 4.24 Mil/uL (ref 3.87–5.11)
RDW: 18.4 % — ABNORMAL HIGH (ref 11.5–15.5)
WBC: 6.5 10*3/uL (ref 4.0–10.5)

## 2019-05-08 LAB — COMPREHENSIVE METABOLIC PANEL
ALT: 17 U/L (ref 0–35)
AST: 21 U/L (ref 0–37)
Albumin: 3.9 g/dL (ref 3.5–5.2)
Alkaline Phosphatase: 60 U/L (ref 39–117)
BUN: 25 mg/dL — ABNORMAL HIGH (ref 6–23)
CO2: 31 mEq/L (ref 19–32)
Calcium: 9.3 mg/dL (ref 8.4–10.5)
Chloride: 105 mEq/L (ref 96–112)
Creatinine, Ser: 1.46 mg/dL — ABNORMAL HIGH (ref 0.40–1.20)
GFR: 34.74 mL/min — ABNORMAL LOW (ref >60.00)
Glucose, Bld: 102 mg/dL — ABNORMAL HIGH (ref 70–99)
Potassium: 3.9 mEq/L (ref 3.5–5.1)
Sodium: 142 mEq/L (ref 135–145)
Total Bilirubin: 0.4 mg/dL (ref 0.2–1.2)
Total Protein: 6.3 g/dL (ref 6.0–8.3)

## 2019-05-08 LAB — IBC + FERRITIN
Ferritin: 7.8 ng/mL — ABNORMAL LOW (ref 10.0–291.0)
Iron: 47 ug/dL (ref 42–145)
Saturation Ratios: 11.8 % — ABNORMAL LOW (ref 20.0–50.0)
Transferrin: 285 mg/dL (ref 212.0–360.0)

## 2019-05-08 NOTE — Telephone Encounter (Signed)
Called and left a message > MAIC in BAL fluid. Awaiting sensitivities.   Will try to call her back.

## 2019-05-09 ENCOUNTER — Other Ambulatory Visit: Payer: Medicare Other

## 2019-05-09 ENCOUNTER — Other Ambulatory Visit: Payer: Self-pay

## 2019-05-09 ENCOUNTER — Encounter: Payer: Self-pay | Admitting: Gynecology

## 2019-05-09 DIAGNOSIS — D649 Anemia, unspecified: Secondary | ICD-10-CM

## 2019-05-09 DIAGNOSIS — R1031 Right lower quadrant pain: Secondary | ICD-10-CM

## 2019-05-09 LAB — LIPASE: Lipase: 47 U/L (ref 11.0–59.0)

## 2019-05-10 ENCOUNTER — Other Ambulatory Visit: Payer: Self-pay

## 2019-05-10 ENCOUNTER — Other Ambulatory Visit: Payer: Medicare Other

## 2019-05-13 ENCOUNTER — Other Ambulatory Visit: Payer: Self-pay

## 2019-05-13 ENCOUNTER — Ambulatory Visit (INDEPENDENT_AMBULATORY_CARE_PROVIDER_SITE_OTHER): Payer: Medicare Other | Admitting: Women's Health

## 2019-05-13 ENCOUNTER — Encounter: Payer: Self-pay | Admitting: Women's Health

## 2019-05-13 VITALS — BP 130/82 | Ht 65.0 in | Wt 143.0 lb

## 2019-05-13 DIAGNOSIS — Z01419 Encounter for gynecological examination (general) (routine) without abnormal findings: Secondary | ICD-10-CM

## 2019-05-13 DIAGNOSIS — M810 Age-related osteoporosis without current pathological fracture: Secondary | ICD-10-CM

## 2019-05-13 MED ORDER — DENOSUMAB 60 MG/ML ~~LOC~~ SOSY
60.0000 mg | PREFILLED_SYRINGE | Freq: Once | SUBCUTANEOUS | Status: AC
  Administered 2019-05-13: 60 mg via SUBCUTANEOUS

## 2019-05-13 NOTE — Progress Notes (Signed)
Sandra Craig 12-03-1940 [id number]    History:    Presents for breast and pelvic exam with no complaints.  TAH with BSO for endometriosis and menorrhagia.  Osteoporosis on Prolia was on for 5 years, stopped 2019 DEXA showed some decline restarted second dose today.  04/2019 lung nodules stable .  Normal mammogram history.  Primary care manages hypertension, hypercholesterolemia, history of a TIA.  Has had zostavac and Pneumovax has not had Shingrix.  At age 22 had a right hip and shoulder fracture from a traumatic fall.  Not sexually active, partner prostate cancer.  Negative colonoscopy 2013.  Past medical history, past surgical history, family history and social history were all reviewed and documented in the EPIC chart.  ROS:  A ROS was performed and pertinent positives and negatives are included.  Exam:  Vitals:   05/13/19 1104  BP: 130/82  Weight: 143 lb (64.9 kg)  Height: 5\' 5"  (1.651 m)   Body mass index is 23.8 kg/m.   General appearance:  Normal Thyroid:  Symmetrical, normal in size, without palpable masses or nodularity. Respiratory  Auscultation:  Clear without wheezing or rhonchi Cardiovascular  Auscultation:  Regular rate, without rubs, murmurs or gallops  Edema/varicosities:  Not grossly evident Abdominal  Soft,nontender, without masses, guarding or rebound.  Liver/spleen:  No organomegaly noted  Hernia:  None appreciated  Skin  Inspection:  Grossly normal   Breasts: Examined lying and sitting.     Right: Without masses, retractions, discharge or axillary adenopathy.     Left: Without masses, retractions, discharge or axillary adenopathy. Gentitourinary   Inguinal/mons:  Normal without inguinal adenopathy  External genitalia:  Normal  BUS/Urethra/Skene's glands:  Normal  Vagina:  Normal  Cervix: And uterus absent  Adnexa/parametria:     Rt: Without masses or tenderness.   Lt: Without masses or tenderness.  Anus and perineum: Normal  Digital rectal  exam: Normal sphincter tone without palpated masses or tenderness  Assessment/Plan:  78 y.o. D WF G1, P0 for breast and pelvic exam with no complaints.  TAH with BSO for endometriosis and menorrhagia greater than 20 years ago Osteoporosis on Prolia Hypertension, hypercholesteremia, anxiety/depression-primary care manages labs and meds  Plan: Prolia 60 mg IM every 6 months.  Has had anemia, reviewed importance of increasing iron rich foods in diet, complete stool cards per primary care.  SBEs, continue annual screening mammogram, calcium rich foods, vitamin D supplement, home safety and fall prevention discussed.  Encouraged weightbearing and balance type exercises such as yoga.  Shingrix discussed and encouraged.    Huel Cote [facility], 11:43 AM 05/13/2019

## 2019-05-13 NOTE — Progress Notes (Signed)
..  lb

## 2019-05-13 NOTE — Patient Instructions (Signed)
shingrex   2 series    Health Maintenance After Age 77 After age 28, you are at a higher risk for certain long-term diseases and infections as well as injuries from falls. Falls are a major cause of broken bones and head injuries in people who are older than age 62. Getting regular preventive care can help to keep you healthy and well. Preventive care includes getting regular testing and making lifestyle changes as recommended by your health care provider. Talk with your health care provider about:  Which screenings and tests you should have. A screening is a test that checks for a disease when you have no symptoms.  A diet and exercise plan that is right for you. What should I know about screenings and tests to prevent falls? Screening and testing are the best ways to find a health problem early. Early diagnosis and treatment give you the best chance of managing medical conditions that are common after age 48. Certain conditions and lifestyle choices may make you more likely to have a fall. Your health care provider may recommend:  Regular vision checks. Poor vision and conditions such as cataracts can make you more likely to have a fall. If you wear glasses, make sure to get your prescription updated if your vision changes.  Medicine review. Work with your health care provider to regularly review all of the medicines you are taking, including over-the-counter medicines. Ask your health care provider about any side effects that may make you more likely to have a fall. Tell your health care provider if any medicines that you take make you feel dizzy or sleepy.  Osteoporosis screening. Osteoporosis is a condition that causes the bones to get weaker. This can make the bones weak and cause them to break more easily.  Blood pressure screening. Blood pressure changes and medicines to control blood pressure can make you feel dizzy.  Strength and balance checks. Your health care provider may recommend  certain tests to check your strength and balance while standing, walking, or changing positions.  Foot health exam. Foot pain and numbness, as well as not wearing proper footwear, can make you more likely to have a fall.  Depression screening. You may be more likely to have a fall if you have a fear of falling, feel emotionally low, or feel unable to do activities that you used to do.  Alcohol use screening. Using too much alcohol can affect your balance and may make you more likely to have a fall. What actions can I take to lower my risk of falls? General instructions  Talk with your health care provider about your risks for falling. Tell your health care provider if: ? You fall. Be sure to tell your health care provider about all falls, even ones that seem minor. ? You feel dizzy, sleepy, or off-balance.  Take over-the-counter and prescription medicines only as told by your health care provider. These include any supplements.  Eat a healthy diet and maintain a healthy weight. A healthy diet includes low-fat dairy products, low-fat (lean) meats, and fiber from whole grains, beans, and lots of fruits and vegetables. Home safety  Remove any tripping hazards, such as rugs, cords, and clutter.  Install safety equipment such as grab bars in bathrooms and safety rails on stairs.  Keep rooms and walkways well-lit. Activity   Follow a regular exercise program to stay fit. This will help you maintain your balance. Ask your health care provider what types of exercise are appropriate for  you.  If you need a cane or walker, use it as recommended by your health care provider.  Wear supportive shoes that have nonskid soles. Lifestyle  Do not drink alcohol if your health care provider tells you not to drink.  If you drink alcohol, limit how much you have: ? 0-1 drink a day for women. ? 0-2 drinks a day for men.  Be aware of how much alcohol is in your drink. In the [place] one drink equals  one typical bottle of beer (12 oz), one-half glass of wine (5 oz), or one shot of hard liquor (1 oz).  Do not use any products that contain nicotine or tobacco, such as cigarettes and e-cigarettes. If you need help quitting, ask your health care provider. Summary  Having a healthy lifestyle and getting preventive care can help to protect your health and wellness after age 53.  Screening and testing are the best way to find a health problem early and help you avoid having a fall. Early diagnosis and treatment give you the best chance for managing medical conditions that are more common for people who are older than age 67.  Falls are a major cause of broken bones and head injuries in people who are older than age 55. Take precautions to prevent a fall at home.  Work with your health care provider to learn what changes you can make to improve your health and wellness and to prevent falls. This information is not intended to replace advice given to you by your health care provider. Make sure you discuss any questions you have with your health care provider. Document Released: 05/31/2017 Document Revised: 11/08/2018 Document Reviewed: 05/31/2017 Elsevier Patient Education  2020 [employer].

## 2019-05-14 NOTE — Telephone Encounter (Signed)
Discussed culture results with the patient by phone today.  The sensitivities are still pending.  Were going to meet next week to talk about the pros and cons of treatment for mycobacterial disease.

## 2019-05-17 NOTE — Telephone Encounter (Signed)
PROLIA GIVEN 05/13/2019 NEXT INJECTION 11/12/2019

## 2019-05-20 ENCOUNTER — Encounter: Payer: Self-pay | Admitting: Emergency Medicine

## 2019-05-20 ENCOUNTER — Other Ambulatory Visit: Payer: Self-pay

## 2019-05-20 ENCOUNTER — Ambulatory Visit: Payer: Medicare Other | Admitting: Emergency Medicine

## 2019-05-20 DIAGNOSIS — A310 Pulmonary mycobacterial infection: Secondary | ICD-10-CM | POA: Diagnosis not present

## 2019-05-20 NOTE — Patient Instructions (Signed)
Once the drug sensitivities on your Mycobacterium avium are available we will start an antibiotic regimen. We will plan to repeat your CT scan of the chest about 6 months after we get you on the antibiotics to follow your micronodules in the right upper lobe. We will have a telephone visit in 1 month to plan next steps.

## 2019-05-20 NOTE — Assessment & Plan Note (Signed)
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  We will treat once sensitivities are available, follow serial imaging to see if her micronodular disease subsides.  If the 1 cm nodule increases in size despite treatment then I think it needs further work-up, possible biopsy. Note that she had difficulty tolerating azithromycin, tolerated the change to clarithromycin last time.  She also had to stop ethambutol due to visual changes.  Once the drug sensitivities on your Mycobacterium avium are available we will start an antibiotic regimen. We will plan to repeat your CT scan of the chest about 6 months after we get you on the antibiotics to follow your micronodules in the right upper lobe. We will have a telephone visit in 1 month to plan next steps.

## 2019-05-20 NOTE — Progress Notes (Signed)
   Subjective:    Patient ID: Sandra Craig, female    DOB: 1940/10/06, 78 y.o.   MRN: [id number]  HPI 78 yo woman, former minimal smoker (7 pk-yrs) with hx colon CA, IBS, has been followed here by Drs Joya Gaskins and then [facility] for [facility] (that I dx by FOB in 12/2014). She was treated for 1 yr with multi-drug regimen, completed 2017. Has not had a positive AFB since, has difficulty producing sputum.  Last CT chest was 03/19/2019 which I reviewed, showed an enlarging posterior right upper lobe nodular cluster, approximately 1 cm in largest dimension.   She is having malaise, fatigue. She has cough, non-productive, not every day. No hemoptysis. She has a chest fullness, discomfort most of the day.   ROV 05/20/2019 --Sandra Craig is 78, has a small minimal prior tobacco history, history of colon cancer, IBS.  She has been treated in the past for [facility] with no positives since the sputum that we collected on 04/23/2019.  We did this in response to a CT chest from 03/19/2019 that showed an enlarging posterior right upper lobe nodular cluster.  Susceptibilities are still pending.   Review of Systems      Objective:   Physical Exam Vitals:   05/20/19 1356  BP: 138/86  Pulse: 78  SpO2: 96%  Weight: 143 lb (64.9 kg)  Height: 5\' 6"  (1.676 m)   Gen: Pleasant, well-nourished, in no distress,  normal affect  ENT: No lesions,  mouth clear,  oropharynx clear, no postnasal drip  Neck: No JVD, no stridor  Lungs: No use of accessory muscles, no crackles or wheezing on normal respiration, no wheeze on forced expiration.   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  I believe that her  micronodular disease and the adjacent 1 cm nodule most likely are due to this.  We will treat once sensitivities are available, follow serial imaging to see if her micronodular disease subsides.  If the 1 cm nodule increases in size despite treatment then I think it needs further work-up, possible biopsy. Note that she had difficulty tolerating azithromycin, tolerated the change to clarithromycin last time.  She also had to stop ethambutol due to visual changes.  Once the drug sensitivities on your Mycobacterium avium are available we will start an antibiotic regimen. We will plan to repeat your CT scan of the chest about 6 months after we get you on the antibiotics to follow your micronodules in the right upper lobe. We will have a telephone visit in 1 month to plan next steps.   Baltazar Apo, MD, PhD 05/20/2019, 2:21 PM [facility] [phone] or if no answer [phone]

## 2019-05-22 LAB — FUNGUS CULTURE WITH STAIN

## 2019-05-22 LAB — FUNGUS CULTURE RESULT

## 2019-05-22 LAB — FUNGAL ORGANISM REFLEX

## 2019-05-30 LAB — MAC SUSCEPTIBILITY BROTH
Clarithromycin: 2
Linezolid: 64
Moxifloxacin: 8
Streptomycin: 32

## 2019-05-30 LAB — AFB ORGANISM ID BY DNA PROBE
M avium complex: POSITIVE — AB
M tuberculosis complex: NEGATIVE

## 2019-05-30 LAB — ACID FAST CULTURE WITH REFLEXED SENSITIVITIES (MYCOBACTERIA): Acid Fast Culture: POSITIVE — AB

## 2019-05-31 ENCOUNTER — Other Ambulatory Visit: Payer: Self-pay | Admitting: Family Medicine

## 2019-06-11 DIAGNOSIS — M545 Low back pain: Secondary | ICD-10-CM | POA: Diagnosis not present

## 2019-06-11 DIAGNOSIS — M47816 Spondylosis without myelopathy or radiculopathy, lumbar region: Secondary | ICD-10-CM | POA: Diagnosis not present

## 2019-06-15 DIAGNOSIS — M545 Low back pain: Secondary | ICD-10-CM | POA: Diagnosis not present

## 2019-06-17 ENCOUNTER — Encounter: Payer: Self-pay | Admitting: Family Medicine

## 2019-06-20 DIAGNOSIS — M47816 Spondylosis without myelopathy or radiculopathy, lumbar region: Secondary | ICD-10-CM | POA: Diagnosis not present

## 2019-06-20 DIAGNOSIS — M545 Low back pain: Secondary | ICD-10-CM | POA: Diagnosis not present

## 2019-07-02 ENCOUNTER — Ambulatory Visit (INDEPENDENT_AMBULATORY_CARE_PROVIDER_SITE_OTHER): Payer: Medicare Other | Admitting: Emergency Medicine

## 2019-07-02 ENCOUNTER — Encounter: Payer: Self-pay | Admitting: Emergency Medicine

## 2019-07-02 ENCOUNTER — Other Ambulatory Visit: Payer: Self-pay

## 2019-07-02 DIAGNOSIS — A310 Pulmonary mycobacterial infection: Secondary | ICD-10-CM | POA: Diagnosis not present

## 2019-07-02 NOTE — Progress Notes (Signed)
Virtual Visit via Video Note  I connected with Sandra Craig on 07/02/19 at 11:45 AM EST by a video enabled telemedicine application and verified that I am speaking with the correct person using two identifiers.  Location: Patient: home Provider: office    I discussed the limitations of evaluation and management by telemedicine and the availability of in person appointments. The patient expressed understanding and agreed to proceed.  History of Present Illness: 78 year old woman, minimal tobacco history, history of colon CA, IBS.  She has a history of MAIC with some pulmonary nodular disease consistent with active disease.  Sputum positive for recurrent MAIC on 9/22.   Observations/Objective: Susceptibility pattern: Susceptible to clarithromycin, amikacin.  Resistant to linezolid and moxifloxacin. She reports today that she has cough, no real mucous production. No fatigue, chest discomfort.   Assessment and Plan: Discussed the pros and cons of treatment of MAIC with Sandra Craig today.  She does have micronodular disease by CT scan, intermittent cough but no mucus production.  That being said it appears that any antibiotic regimen that we decide upon is going to be complex since the organism is resistant to moxifloxacin, linezolid and she is allergic to azithromycin.  I have asked her to review the case with infectious diseases to go over the pros and cons of treatment.  The complexity of the regimen will probably influence whether we decide to treat or not.  Follow Up Instructions: Referral to ID ROV with Dr. Lamonte Sakai in 2 months   I discussed the assessment and treatment plan with the patient. The patient was provided an opportunity to ask questions and all were answered. The patient agreed with the plan and demonstrated an understanding of the instructions.   The patient was advised to call back or seek an in-person evaluation if the symptoms worsen or if the condition fails to improve as  anticipated.  I provided 16 minutes of non-face-to-face time during this encounter.   Collene Gobble, MD

## 2019-07-10 DIAGNOSIS — M47816 Spondylosis without myelopathy or radiculopathy, lumbar region: Secondary | ICD-10-CM | POA: Diagnosis not present

## 2019-07-10 DIAGNOSIS — M545 Low back pain: Secondary | ICD-10-CM | POA: Diagnosis not present

## 2019-07-24 ENCOUNTER — Other Ambulatory Visit: Payer: Self-pay | Admitting: Family Medicine

## 2019-07-24 NOTE — Telephone Encounter (Signed)
Pt is going to [facility] for the next two months and is needing hard scripts for the following prescriptions. 1. famotidine (PEPCID) 20 MG tablet [[id number]] 2. DULoxetine (CYMBALTA) 30 MG capsule [[id number]] 3. rosuvastatin (CRESTOR) 20 MG tablet [[id number]] 4. gabapentin (NEURONTIN) 300 MG capsule [[id number]] 5. amLODipine (NORVASC) 2.5 MG tablet [[id number]]  6. zolpidem (AMBIEN) 5 MG tablet [[id number]]  *Pt's benefits are changing in the new year and the Ambien is going up tremendously up in price. Pt is wondering if she can go back to the 10mg  prescription and cut in half with a 90 day supply*   Pt has AWV in the office 12.31.20 and would like to get the hard copies then if possible. slaPlease advise.

## 2019-07-25 DIAGNOSIS — M47816 Spondylosis without myelopathy or radiculopathy, lumbar region: Secondary | ICD-10-CM | POA: Diagnosis not present

## 2019-07-25 MED ORDER — FAMOTIDINE 20 MG PO TABS: 20.0000 mg | ORAL_TABLET | Freq: Two times a day (BID) | ORAL | 3 refills | Status: DC

## 2019-07-25 MED ORDER — AMLODIPINE BESYLATE 2.5 MG PO TABS: 2.5000 mg | ORAL_TABLET | Freq: Every day | ORAL | 3 refills | Status: DC

## 2019-07-25 MED ORDER — GABAPENTIN 300 MG PO CAPS: 300.0000 mg | ORAL_CAPSULE | Freq: Two times a day (BID) | ORAL | 3 refills | Status: DC | PRN

## 2019-07-25 MED ORDER — ROSUVASTATIN CALCIUM 20 MG PO TABS: 20.0000 mg | ORAL_TABLET | Freq: Every day | ORAL | 3 refills | Status: DC

## 2019-07-25 MED ORDER — DULOXETINE HCL 30 MG PO CPEP: 30.0000 mg | ORAL_CAPSULE | Freq: Two times a day (BID) | ORAL | 3 refills | Status: DC

## 2019-07-25 MED ORDER — ZOLPIDEM TARTRATE 10 MG PO TABS: 5.0000 mg | ORAL_TABLET | Freq: Every evening | ORAL | 3 refills | Status: DC | PRN

## 2019-07-25 NOTE — Telephone Encounter (Signed)
Have pulled up for ok. Did not make changes to Ambien script all set to print as requested by patient.   LAST APPOINTMENT DATE: @7 /22/2020  NEXT APPOINTMENT DATE:@12 /31/2020--[facility]  No f/u with PCP in chart.

## 2019-07-25 NOTE — Telephone Encounter (Signed)
Noted. Will provide scripts to patient at appointment

## 2019-07-30 ENCOUNTER — Telehealth: Payer: Self-pay

## 2019-07-30 NOTE — Telephone Encounter (Signed)
Copied from [place] [phone]. Topic: General - Other >> Jul 30, 2019  2:39 PM Mcneil, Ja-Kwan wrote: Reason for CRM: Pt stated she was just calling back because she had a missed call from the office. Pt requests call back

## 2019-07-30 NOTE — Telephone Encounter (Signed)
Returned call to make pt aware that the office was calling just to remind her of her appointment on Friday.

## 2019-08-01 ENCOUNTER — Other Ambulatory Visit: Payer: Self-pay

## 2019-08-01 ENCOUNTER — Ambulatory Visit (INDEPENDENT_AMBULATORY_CARE_PROVIDER_SITE_OTHER): Payer: Medicare Other

## 2019-08-01 ENCOUNTER — Other Ambulatory Visit (INDEPENDENT_AMBULATORY_CARE_PROVIDER_SITE_OTHER): Payer: Medicare Other

## 2019-08-01 VITALS — BP 130/68 | HR 70 | Temp 98.3°F | Ht 66.0 in | Wt 146.0 lb

## 2019-08-01 DIAGNOSIS — D649 Anemia, unspecified: Secondary | ICD-10-CM

## 2019-08-01 DIAGNOSIS — Z0000 Encounter for general adult medical examination without abnormal findings: Secondary | ICD-10-CM

## 2019-08-01 NOTE — Progress Notes (Signed)
Subjective:   Sandra Craig is a 78 y.o. female who presents for Medicare Annual (Subsequent) preventive examination.  Review of Systems:   Cardiac Risk Factors include: advanced age (>52men, >43 women);hypertension;dyslipidemia    Objective:     Vitals: BP 130/68 (BP Location: Left Arm, Patient Position: Sitting, Cuff Size: Normal)   Pulse 70   Temp 98.3 F (36.8 C) (Temporal)   Ht 5\' 6"  (1.676 m)   Wt 146 lb (66.2 kg)   LMP 08/25/1992   SpO2 96%   BMI 23.57 kg/m   Body mass index is 23.57 kg/m.  Advanced Directives 08/01/2019 04/23/2019 10/12/2018 07/26/2018 06/02/2017 11/06/2015 08/17/2015  Does Patient Have a Medical Advance Directive? Yes Yes Yes Yes Yes Yes Yes  Type of Advance Directive Living will;Healthcare Power of [place];Living will - [place];Living will - [place];Living will [place];Living will  Does patient want to make changes to medical advance directive? No - Patient declined - - No - Patient declined - - -  Copy of [place] in Chart? No - copy requested No - copy requested - No - copy requested - No - copy requested -  Pre-existing out of facility DNR order (yellow form or pink MOST form) - - - - - - -    Tobacco Social History   Tobacco Use  Smoking Status Former Smoker  . Packs/day: 0.75  . Years: 8.00  . Pack years: 6.00  . Types: Cigarettes  . Quit date: 08/01/1976  . Years since quitting: 43.0  Smokeless Tobacco Never Used  Tobacco Comment   Quit in 1978     Counseling given: Not Answered Comment: Quit in 1978   Clinical Intake:  Pre-visit preparation completed: Yes  Pain : 0-10 Pain Score: 2  Pain Type: Chronic pain Pain Location: Back Pain Onset: More than a month ago Pain Frequency: Constant  Diabetes: No  How often do you need to have someone help you when you read instructions, pamphlets, or other written materials  from your doctor or pharmacy?: 1 - Never  Interpreter Needed?: No  Information entered by :: Denman George LPN  Past Medical History:  Diagnosis Date  . 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 Chronic insomnia   . Cystitis   . Diverticulitis of intestine without perforation or abscess without bleeding    Patient did have abscess but noperforation  . Diverticulosis of colon (without mention of hemorrhage)   . Endometriosis   . Family history of malignant neoplasm of gastrointestinal tract   . Fibromyalgia   . Gastritis   . Hiatal hernia   . HIATAL HERNIA 10/08/2008   Qualifier: Diagnosis of  By: Nils Pyle CMA ([place]), Mearl Latin    . History of gallstones   . Hyperlipidemia   . Hypertension   . IBS (irritable bowel syndrome)   . IC (interstitial cystitis)   . Internal hemorrhoid   . Osteonecrosis ([place])   . Osteopenia   . Osteoporosis   . Palpitations   . Polycystic kidney disease   . Pulmonary nodule 12/08   5 mm Anterior RUL  . Small bowel obstruction ([place])   . Stroke [facility])    Past Surgical History:  Procedure Laterality Date  . ABDOMINAL HYSTERECTOMY  1994   TAH,BSO FOR ENDOMETRIOSIS  . ABDOMINAL SURGERY  2011   small intestine blockage  . CHOLECYSTECTOMY    . GASTROPLASTY  2011  small bowel resection -open  . JOINT REPLACEMENT  2008  . OOPHORECTOMY  1994   TAH,BSO  . PELVIC LAPAROSCOPY    . S/P right shoulder rotater cuff  200216/2011   Tear/adhesive capsulitis  . SBO Lap  11   Adhesions and small internal hernia  . SHOULDER HEMI-ARTHROPLASTY  06/05/2012   Procedure: SHOULDER HEMI-ARTHROPLASTY;  Surgeon: Johnny Bridge, MD;  Location: [place];  Service: Orthopedics;  Laterality: Right;  FOR ARTHRITIS  . TOTAL HIP ARTHROPLASTY  FALL OF 2008   rt. partial hip replacement  . TOTAL SHOULDER ARTHROPLASTY  06/05/2012   Procedure: TOTAL SHOULDER ARTHROPLASTY;  Surgeon: Johnny Bridge, MD;  Location: [place];  Service: Orthopedics;  Laterality: Right;  RIGHT  SHOULDER TOTAL ARTHROPLASTY, HEMIARTHROPLASTY, SHOULDER, FOR ARTHRITIS  . VIDEO BRONCHOSCOPY Bilateral 01/28/2015   Procedure: VIDEO BRONCHOSCOPY WITH FLUORO;  Surgeon: Collene Gobble, MD;  Location: [place];  Service: Cardiopulmonary;  Laterality: Bilateral;  . VIDEO BRONCHOSCOPY Bilateral 04/23/2019   Procedure: VIDEO BRONCHOSCOPY WITHOUT FLUORO;  Surgeon: Collene Gobble, MD;  Location: [facility] ENDOSCOPY;  Service: Cardiopulmonary;  Laterality: Bilateral;   Family History  Problem Relation Age of Onset  . Heart disease Mother        MI at age 73  . Emphysema Mother   . Thyroid disease Mother        Thyroidectomy/Benign  . Lymphoma Maternal Grandmother   . Colon cancer Paternal Grandmother   . COPD Father   . Cancer Brother        adenocarcinoma right lung  . COPD Brother   . Heart attack Paternal Grandfather    Social History   Socioeconomic History  . Marital status: Widowed    Spouse name: Not on file  . Number of children: 0  . Years of education: Not on file  . Highest education level: Not on file  Occupational History  . Occupation: Retired    Comment: Manufacturing engineer  Tobacco Use  . Smoking status: Former Smoker    Packs/day: 0.75    Years: 8.00    Pack years: 6.00    Types: Cigarettes    Quit date: 08/01/1976    Years since quitting: 43.0  . Smokeless tobacco: Never Used  . Tobacco comment: Quit in 1978  Substance and Sexual Activity  . Alcohol use: No    Alcohol/week: 0.0 standard drinks  . Drug use: No  . Sexual activity: Yes  Other Topics Concern  . Not on file  Social History Narrative   Enjoys reading    Social Determinants of Health   Financial Resource Strain:   . Difficulty of Paying Living Expenses: Not on file  Food Insecurity:   . Worried About Charity fundraiser in the Last Year: Not on file  . Ran Out of Food in the Last Year: Not on file  Transportation Needs:   . Lack of Transportation (Medical): Not on file  . Lack of  Transportation (Non-Medical): Not on file  Physical Activity:   . Days of Exercise per Week: Not on file  . Minutes of Exercise per Session: Not on file  Stress:   . Feeling of Stress : Not on file  Social Connections:   . Frequency of Communication with Friends and Family: Not on file  . Frequency of Social Gatherings with Friends and Family: Not on file  . Attends Religious Services: Not on file  . Active Member of Clubs or Organizations: Not on file  .  Attends Archivist Meetings: Not on file  . Marital Status: Not on file    Outpatient Encounter Medications as of 08/01/2019  Medication Sig  . amLODipine (NORVASC) 2.5 MG tablet Take 1 tablet (2.5 mg total) by mouth daily.  Marland Kitchen aspirin 81 MG tablet Take 1 tablet (81 mg total) by mouth daily.  . calcium-vitamin D 250-100 MG-UNIT tablet Take 1 tablet by mouth 2 (two) times daily.  Marland Kitchen denosumab (PROLIA) 60 MG/ML SOSY injection Inject 60 mg into the skin every 6 (six) months.  . DULoxetine (CYMBALTA) 30 MG capsule Take 1 capsule (30 mg total) by mouth 2 (two) times daily.  . famotidine (PEPCID) 20 MG tablet Take 1 tablet (20 mg total) by mouth 2 (two) times daily.  Marland Kitchen gabapentin (NEURONTIN) 300 MG capsule Take 1 capsule (300 mg total) by mouth 2 (two) times daily as needed (pain). TAKE 1 CAPSULE(300 MG) BY MOUTH TWICE DAILY AS NEEDED  . Polyethyl Glycol-Propyl Glycol (SYSTANE) 0.4-0.3 % GEL ophthalmic gel Place 1 application into both eyes 3 (three) times daily as needed (dry eye).  . rosuvastatin (CRESTOR) 20 MG tablet Take 1 tablet (20 mg total) by mouth daily.  Marland Kitchen zolpidem (AMBIEN) 10 MG tablet Take 0.5 tablets (5 mg total) by mouth at bedtime as needed. for sleep   No facility-administered encounter medications on file as of 08/01/2019.    Activities of Daily Living In your present state of health, do you have any difficulty performing the following activities: 08/01/2019  Hearing? N  Vision? N  Difficulty concentrating or  making decisions? N  Walking or climbing stairs? N  Dressing or bathing? N  Doing errands, shopping? N  Preparing Food and eating ? N  Using the Toilet? N  In the past six months, have you accidently leaked urine? N  Do you have problems with loss of bowel control? N  Managing your Medications? N  Managing your Finances? N  Housekeeping or managing your Housekeeping? N  Some recent data might be hidden    Patient Care Team: [name], MD as PCP - General (Family Medicine) Collene Gobble, MD as Consulting Physician (Pulmonary Disease) Huel Cote, NP as Nurse Practitioner (Obstetrics and Gynecology) Hal Morales, MD as Consulting Physician (Neurology)    Assessment:   This is a routine wellness examination for Marcy.  Exercise Activities and Dietary recommendations Current Exercise Habits: The patient does not participate in regular exercise at present  Goals    . Patient Stated (pt-stated)     Patient will join [employer] after the first of the 2020 year. She will work out at [employer].       Fall Risk Fall Risk  08/01/2019 10/12/2018 07/26/2018 01/23/2018 02/09/2017  Falls in the past year? 0 1 0 No No  Comment - fell over dog's crate- no injury - - -  Number falls in past yr: - 0 - - -  Injury with Fall? 0 0 - - -  Follow up Education provided;Falls prevention discussed;Falls evaluation completed - - - -   Is the patient's home free of loose throw rugs in walkways, pet beds, electrical cords, etc?   yes      Grab bars in the bathroom? yes      Handrails on the stairs?   yes      Adequate lighting?   yes  Timed Get Up and Go performed: completed and within normal timeframe; no gait abnormalities noted   Depression Screen  PHQ 2/9 Scores 08/01/2019 01/29/2019 10/12/2018 07/26/2018  PHQ - 2 Score 0 0 0 0  PHQ- 9 Score - 0 - 0     Cognitive Function- no cognitive concerns at this time  Cognitive Testing  Alert? Yes         Normal Appearance?  Yes  Oriented to person? Yes           Place? Yes  Time? Yes  Recall of three objects? Yes  Can perform simple calculations? Yes  Displays appropriate judgment? Yes  Can read the correct time from a watch face? Yes   MMSE - Mini Mental State Exam 07/26/2018  Orientation to time 5  Orientation to Place 5  Registration 3  Attention/ Calculation 5  Recall 3  Language- name 2 objects 2  Language- repeat 1  Language- follow 3 step command 3  Language- read & follow direction 1  Write a sentence 1  Copy design 1  Total score 30    Immunization History  Administered Date(s) Administered  . Fluad Quad(high Dose 65+) 04/02/2019  . Influenza Split 05/23/2012  . Influenza Whole 05/31/2006, 05/28/2007, 05/14/2008, 04/22/2009, 05/01/2010, 04/12/2011  . Influenza, High Dose Seasonal PF 05/19/2013, 03/26/2014, 04/21/2016, 04/21/2017, 04/21/2018, 04/02/2019  . Influenza,inj,Quad PF,6+ Mos 04/15/2015  . Influenza-Unspecified 04/29/2018  . Pneumococcal Conjugate-13 02/03/2015  . Pneumococcal Polysaccharide-23 05/31/2006, 06/07/2012  . Td 12/21/2009  . Zoster 02/06/2006    Qualifies for Shingles Vaccine?Discussed and patient will check with pharmacy for coverage.  Patient education handout provided   Screening Tests Health Maintenance  Topic Date Due  . TETANUS/TDAP  12/22/2019  . MAMMOGRAM  02/09/2020  . INFLUENZA VACCINE  Completed  . DEXA SCAN  Completed  . PNA vac Low Risk Adult  Completed    Cancer Screenings: Lung: Low Dose CT Chest recommended if Age 33-80 years, 30 pack-year currently smoking OR have quit w/in 15years. Patient does not qualify. Breast:  Up to date on Mammogram? Yes   Up to date of Bone Density/Dexa? Yes Colorectal: colonoscopy 04/30/12; fob 08/01/19    Plan:  I have personally reviewed and addressed the Medicare Annual Wellness questionnaire and have noted the following in the patient's chart:  A. Medical and social history B. Use of alcohol, tobacco or  illicit drugs  C. Current medications and supplements D. Functional ability and status E.  Nutritional status F.  Physical activity G. Advance directives H. List of other physicians I.  Hospitalizations, surgeries, and ER visits in previous 12 months J.  [place] such as hearing and vision if needed, cognitive and depression L. Referrals, records requested, and appointments- none   In addition, I have reviewed and discussed with patient certain preventive protocols, quality metrics, and best practice recommendations. A written personalized care plan for preventive services as well as general preventive health recommendations were provided to patient.   Signed,  Denman George, LPN  Nurse Health Advisor   Nurse Notes: no additional

## 2019-08-01 NOTE — Patient Instructions (Signed)
Sandra Craig  Thank you for taking time to come for your Medicare Wellness Visit. I appreciate your ongoing commitment to your health goals. Please review the following plan we discussed and let me know if I can assist you in the future.   Screening recommendations/referrals: Colorectal Screening: up to date; last 04/30/12 Mammogram: up to date; last 7/11/ Bone Density: up to date; last 07/19/18  Vision and Dental Exams:  Recommended annual ophthalmology exams for early detection of glaucoma and other disorders of the eye Recommended annual dental exams for proper oral hygiene  Vaccinations: Influenza vaccine: completed 04/02/19 Pneumococcal vaccine: up to date; last 02/03/15 Tdap vaccine: up to date; last 12/21/09  Shingles vaccine: Please call your insurance company to determine your out of pocket expense for the Shingrix vaccine. You may receive this vaccine at your local pharmacy.  Advanced directives: Please bring a copy of your POA (Power of Attorney) and/or Living Will to your next appointment.  Goals: Recommend to drink at least 6-8 8oz glasses of water per day and consume a balanced diet rich in fresh fruits and vegetables.   Next appointment: Please schedule your Annual Wellness Visit with your Nurse Health Advisor in one year.  Preventive Care 23 Years and Older, Female Preventive care refers to lifestyle choices and visits with your health care provider that can promote health and wellness. What does preventive care include?  A yearly physical exam. This is also called an annual well check.  Dental exams once or twice a year.  Routine eye exams. Ask your health care provider how often you should have your eyes checked.  Personal lifestyle choices, including:  Daily care of your teeth and gums.  Regular physical activity.  Eating a healthy diet.  Avoiding tobacco and drug use.  Limiting alcohol use.  Practicing safe sex.  Taking low-dose aspirin every day if  recommended by your health care provider.  Taking vitamin and mineral supplements as recommended by your health care provider. What happens during an annual well check? The services and screenings done by your health care provider during your annual well check will depend on your age, overall health, lifestyle risk factors, and family history of disease. Counseling  Your health care provider may ask you questions about your:  Alcohol use.  Tobacco use.  Drug use.  Emotional well-being.  Home and relationship well-being.  Sexual activity.  Eating habits.  History of falls.  Memory and ability to understand (cognition).  Work and work Statistician.  Reproductive health. Screening  You may have the following tests or measurements:  Height, weight, and BMI.  Blood pressure.  Lipid and cholesterol levels. These may be checked every 5 years, or more frequently if you are over 84 years old.  Skin check.  Lung cancer screening. You may have this screening every year starting at age 38 if you have a 30-pack-year history of smoking and currently smoke or have quit within the past 15 years.  Fecal occult blood test (FOBT) of the stool. You may have this test every year starting at age 34.  Flexible sigmoidoscopy or colonoscopy. You may have a sigmoidoscopy every 5 years or a colonoscopy every 10 years starting at age 41.  Hepatitis C blood test.  Hepatitis B blood test.  Sexually transmitted disease (STD) testing.  Diabetes screening. This is done by checking your blood sugar (glucose) after you have not eaten for a while (fasting). You may have this done every 1-3 years.  Bone density scan.  This is done to screen for osteoporosis. You may have this done starting at age 71.  Mammogram. This may be done every 1-2 years. Talk to your health care provider about how often you should have regular mammograms. Talk with your health care provider about your test results,  treatment options, and if necessary, the need for more tests. Vaccines  Your health care provider may recommend certain vaccines, such as:  Influenza vaccine. This is recommended every year.  Tetanus, diphtheria, and acellular pertussis (Tdap, Td) vaccine. You may need a Td booster every 10 years.  Zoster vaccine. You may need this after age 11.  Pneumococcal 13-valent conjugate (PCV13) vaccine. One dose is recommended after age 28.  Pneumococcal polysaccharide (PPSV23) vaccine. One dose is recommended after age 72. Talk to your health care provider about which screenings and vaccines you need and how often you need them. This information is not intended to replace advice given to you by your health care provider. Make sure you discuss any questions you have with your health care provider. Document Released: 08/14/2015 Document Revised: 04/06/2016 Document Reviewed: 05/19/2015 Elsevier Interactive Patient Education  2017 [place] Prevention in the Home Falls can cause injuries. They can happen to people of all ages. There are many things you can do to make your home safe and to help prevent falls. What can I do on the outside of my home?  Regularly fix the edges of walkways and driveways and fix any cracks.  Remove anything that might make you trip as you walk through a door, such as a raised step or threshold.  Trim any bushes or trees on the path to your home.  Use bright outdoor lighting.  Clear any walking paths of anything that might make someone trip, such as rocks or tools.  Regularly check to see if handrails are loose or broken. Make sure that both sides of any steps have handrails.  Any raised decks and porches should have guardrails on the edges.  Have any leaves, snow, or ice cleared regularly.  Use sand or salt on walking paths during winter.  Clean up any spills in your garage right away. This includes oil or grease spills. What can I do in the  bathroom?  Use night lights.  Install grab bars by the toilet and in the tub and shower. Do not use towel bars as grab bars.  Use non-skid mats or decals in the tub or shower.  If you need to sit down in the shower, use a plastic, non-slip stool.  Keep the floor dry. Clean up any water that spills on the floor as soon as it happens.  Remove soap buildup in the tub or shower regularly.  Attach bath mats securely with double-sided non-slip rug tape.  Do not have throw rugs and other things on the floor that can make you trip. What can I do in the bedroom?  Use night lights.  Make sure that you have a light by your bed that is easy to reach.  Do not use any sheets or blankets that are too big for your bed. They should not hang down onto the floor.  Have a firm chair that has side arms. You can use this for support while you get dressed.  Do not have throw rugs and other things on the floor that can make you trip. What can I do in the kitchen?  Clean up any spills right away.  Avoid walking on wet floors.  Keep items that you use a lot in easy-to-reach places.  If you need to reach something above you, use a strong step stool that has a grab bar.  Keep electrical cords out of the way.  Do not use floor polish or wax that makes floors slippery. If you must use wax, use non-skid floor wax.  Do not have throw rugs and other things on the floor that can make you trip. What can I do with my stairs?  Do not leave any items on the stairs.  Make sure that there are handrails on both sides of the stairs and use them. Fix handrails that are broken or loose. Make sure that handrails are as long as the stairways.  Check any carpeting to make sure that it is firmly attached to the stairs. Fix any carpet that is loose or worn.  Avoid having throw rugs at the top or bottom of the stairs. If you do have throw rugs, attach them to the floor with carpet tape.  Make sure that you have a  light switch at the top of the stairs and the bottom of the stairs. If you do not have them, ask someone to add them for you. What else can I do to help prevent falls?  Wear shoes that:  Do not have high heels.  Have rubber bottoms.  Are comfortable and fit you well.  Are closed at the toe. Do not wear sandals.  If you use a stepladder:  Make sure that it is fully opened. Do not climb a closed stepladder.  Make sure that both sides of the stepladder are locked into place.  Ask someone to hold it for you, if possible.  Clearly mark and make sure that you can see:  Any grab bars or handrails.  First and last steps.  Where the edge of each step is.  Use tools that help you move around (mobility aids) if they are needed. These include:  Canes.  Walkers.  Scooters.  Crutches.  Turn on the lights when you go into a dark area. Replace any light bulbs as soon as they burn out.  Set up your furniture so you have a clear path. Avoid moving your furniture around.  If any of your floors are uneven, fix them.  If there are any pets around you, be aware of where they are.  Review your medicines with your doctor. Some medicines can make you feel dizzy. This can increase your chance of falling. Ask your doctor what other things that you can do to help prevent falls. This information is not intended to replace advice given to you by your health care provider. Make sure you discuss any questions you have with your health care provider. Document Released: 05/14/2009 Document Revised: 12/24/2015 Document Reviewed: 08/22/2014 Elsevier Interactive Patient Education  2017 [employer].

## 2019-08-02 DIAGNOSIS — I639 Cerebral infarction, unspecified: Secondary | ICD-10-CM

## 2019-08-02 HISTORY — DX: Cerebral infarction, unspecified: I63.9

## 2019-08-05 DIAGNOSIS — M461 Sacroiliitis, not elsewhere classified: Secondary | ICD-10-CM | POA: Diagnosis not present

## 2019-08-05 DIAGNOSIS — M47816 Spondylosis without myelopathy or radiculopathy, lumbar region: Secondary | ICD-10-CM | POA: Diagnosis not present

## 2019-08-05 DIAGNOSIS — Z0120 Encounter for dental examination and cleaning without abnormal findings: Secondary | ICD-10-CM | POA: Diagnosis not present

## 2019-08-05 DIAGNOSIS — M545 Low back pain: Secondary | ICD-10-CM | POA: Diagnosis not present

## 2019-08-06 LAB — FECAL OCCULT BLOOD, IMMUNOCHEMICAL: Fecal Occult Bld: POSITIVE — AB

## 2019-08-06 NOTE — Progress Notes (Signed)
Patient had blood in her stool detected.  Please refer her to gastroenterology under anemia

## 2019-08-08 ENCOUNTER — Other Ambulatory Visit: Payer: Self-pay | Admitting: Family Medicine

## 2019-08-08 NOTE — Telephone Encounter (Signed)
Last OV 08/01/19 Last refill Gabapentin 07/25/19 #180/3                 Rosuvastatin 07/25/19 #90/3 Next OV not scheduled

## 2019-08-20 ENCOUNTER — Other Ambulatory Visit: Payer: Self-pay | Admitting: Family Medicine

## 2019-08-21 NOTE — Telephone Encounter (Signed)
Requesting refill for Zolpidem. Last OV 01/2019.

## 2019-08-26 ENCOUNTER — Other Ambulatory Visit: Payer: Self-pay

## 2019-08-26 ENCOUNTER — Telehealth: Payer: Self-pay | Admitting: Family Medicine

## 2019-08-26 DIAGNOSIS — M545 Low back pain: Secondary | ICD-10-CM | POA: Diagnosis not present

## 2019-08-26 DIAGNOSIS — M47816 Spondylosis without myelopathy or radiculopathy, lumbar region: Secondary | ICD-10-CM | POA: Diagnosis not present

## 2019-08-26 DIAGNOSIS — D649 Anemia, unspecified: Secondary | ICD-10-CM

## 2019-08-26 DIAGNOSIS — M48061 Spinal stenosis, lumbar region without neurogenic claudication: Secondary | ICD-10-CM | POA: Diagnosis not present

## 2019-08-26 NOTE — Telephone Encounter (Signed)
Patient called back. Referral was never started so I have done that. Patient had following questions.  Should she be on iron supplement with pos cards. She has not started They are going out of town on 1/27 and will not be back until 3/6 is it ok to hold off on referral until then? Patient has not had any abdominal pain or visible blood in stool.  Can she have cbc checked in next day or two to see if any change.

## 2019-08-26 NOTE — Telephone Encounter (Signed)
Mail box full will call back later.

## 2019-08-26 NOTE — Telephone Encounter (Signed)
Pt called - she had a positive fecal occult test back in Dec and was told that she was going to have a GI referral put in for her.  She is leaving to go out of town and will be back the beginning of March.  She wants to know if it will be ok waiting that long to see GI.  She also has anemia, which is why the occult test was done.  Pt reports that she has been feeling fine.  She would really like a call back from a CMA about this.  A GI referral also needs to be put in for her.

## 2019-08-26 NOTE — Telephone Encounter (Signed)
Ideally we would get referral done as soon as possible. As long as CBC stable (please order under anemia) she should be ok to travel. Im ok with her taking iron once or twice a week but can affect colonoscopy so likely stop at least a month out from any potential imaging

## 2019-08-27 ENCOUNTER — Other Ambulatory Visit: Payer: Self-pay

## 2019-08-27 DIAGNOSIS — D649 Anemia, unspecified: Secondary | ICD-10-CM

## 2019-08-27 NOTE — Telephone Encounter (Signed)
Labs ordered. Called pt but could not lvm, mailbox was full.

## 2019-08-27 NOTE — Telephone Encounter (Signed)
Called patient reviewed all information. We have made appointment for labs.

## 2019-08-28 ENCOUNTER — Other Ambulatory Visit (INDEPENDENT_AMBULATORY_CARE_PROVIDER_SITE_OTHER): Payer: Medicare Other

## 2019-08-28 ENCOUNTER — Encounter: Payer: Self-pay | Admitting: Family Medicine

## 2019-08-28 DIAGNOSIS — D649 Anemia, unspecified: Secondary | ICD-10-CM

## 2019-08-28 LAB — CBC WITH DIFFERENTIAL/PLATELET
Basophils Absolute: 0 10*3/uL (ref 0.0–0.1)
Basophils Relative: 0.4 % (ref 0.0–3.0)
Eosinophils Absolute: 0.1 10*3/uL (ref 0.0–0.7)
Eosinophils Relative: 1.6 % (ref 0.0–5.0)
HCT: 37.6 % (ref 36.0–46.0)
Hemoglobin: 12.1 g/dL (ref 12.0–15.0)
Lymphocytes Relative: 36.8 % (ref 12.0–46.0)
Lymphs Abs: 2.4 10*3/uL (ref 0.7–4.0)
MCHC: 32.2 g/dL (ref 30.0–36.0)
MCV: 86.1 fl (ref 78.0–100.0)
Monocytes Absolute: 0.5 10*3/uL (ref 0.1–1.0)
Monocytes Relative: 7.5 % (ref 3.0–12.0)
Neutro Abs: 3.5 10*3/uL (ref 1.4–7.7)
Neutrophils Relative %: 53.7 % (ref 43.0–77.0)
Platelets: 165 10*3/uL (ref 150.0–400.0)
RBC: 4.36 Mil/uL (ref 3.87–5.11)
RDW: 17.1 % — ABNORMAL HIGH (ref 11.5–15.5)
WBC: 6.5 10*3/uL (ref 4.0–10.5)

## 2019-08-29 ENCOUNTER — Other Ambulatory Visit: Payer: Self-pay

## 2019-08-29 ENCOUNTER — Encounter: Payer: Self-pay | Admitting: Family Medicine

## 2019-08-29 ENCOUNTER — Ambulatory Visit: Payer: Medicare Other | Admitting: Obstetrics and Gynecology

## 2019-08-29 VITALS — BP 120/84

## 2019-08-29 DIAGNOSIS — R1900 Intra-abdominal and pelvic swelling, mass and lump, unspecified site: Secondary | ICD-10-CM | POA: Diagnosis not present

## 2019-08-29 NOTE — Progress Notes (Signed)
   Sandra Craig  1941-04-22 [id number]  HPI The patient is a 79 y.o. G1P0010 who presents today for a 'knot' in the mons area.  The area feels 'spongy.'  She first noticed it about 7 Craig ago while she was still in [place], where she was visiting her sister who was recently diagnosed with pancreatic cancer.   She says this "knot" feels like a lump or dome.  No fluid leakage from the area, not warm, not tender, no pain other than poking at it will cause throbbing.  It has not changed in size but was just suddenly there.  No fever/chills, no nausea or vomiting, no change in appetite.  She has a GI appointment to work up blood in stool that she discovered earlier this month.    Past medical history,surgical history, problem list, medications, allergies, family history and social history were all reviewed and documented as reviewed in the EPIC chart.  ROS:  Feeling well. No dyspnea or chest pain on exertion.  No abdominal pain, change in bowel habits, black or bloody stools today.  No urinary tract symptoms. GYN ROS: no abnormal bleeding, pelvic pain or discharge. No neurological complaints.  Physical Exam  BP 120/84   LMP 08/25/1992   General: Pleasant female, no acute distress, alert and oriented Abdomen: Soft, nondistended, nontender.  Palpation over the mons pubis area does not reveal and the unusual findings.  The patient is also examined standing up at which point there is notably a bulge to the right of the midline in the mons pubis which is soft, not fluctuant, not abnormally warm, and there are no overlying skin changes.  The soft mass/bulge is mobile, about 6 cm in diameter, and mildly tender to deep palpation.  There is no appreciable fascial defect, but deep exam is somewhat limited by patient discomfort.  She is also examined with abdomen flexed without definite sign of hernia.  Faint midline vertical scar present in the lower abdomen. PELVIC EXAM: VULVA: normal appearing vulva with no  masses, tenderness or lesions, VAGINA: normal appearing vagina with normal color and discharge, no lesions UTERUS: surgically absent, vaginal cuff well healed, ADNEXA: normal adnexa in size, nontender and no masses, no masses, non-tender.   Assessment 79 y.o. G1P0010 presenting with a bulge in the mons pubis area which could be a direct hernia versus lipoma versus varicosity.  Plan Discussed with Sandra Craig that I would be most concerned about a potential hernia and to more thoroughly evaluate this I would recommend proceeding with a CT of the abdomen and pelvis.  We discussed that if there is any ongoing and worsening pain this could represent incarceration of bowel in the hernia which can result in bowel damage and need for surgery/resection.  She has had bowel blockages in the past she tells me.  For now she would like to just monitor the symptoms.  I also offered referral to general surgery.  She is also seeing a GI provider within the coming weeks.  She will let me know if she would like to proceed with CT imaging and we can place the order.  All questions were answered to her satisfaction by the end of the visit.   Sandra Days MD 08/29/19

## 2019-08-29 NOTE — Patient Instructions (Signed)
Please let us know if you decide that you would like to proceed with the CT imaging so we can check the abdomen for signs of hernia, or to disprove this therapy.  In the meantime, if you experience any severe pain in this area, you should go to the emergency room for evaluation.

## 2019-08-30 ENCOUNTER — Telehealth: Payer: Self-pay | Admitting: *Deleted

## 2019-08-30 ENCOUNTER — Ambulatory Visit: Payer: Medicare Other | Admitting: Obstetrics and Gynecology

## 2019-08-30 DIAGNOSIS — R1900 Intra-abdominal and pelvic swelling, mass and lump, unspecified site: Secondary | ICD-10-CM

## 2019-08-30 NOTE — Telephone Encounter (Signed)
Without. Thank you.

## 2019-08-30 NOTE — Telephone Encounter (Signed)
Patient calling back to follow up from office visit on 08/29/19 would like to proceed with CT scan abdomen/ pelvis , she mentioned she has kidney disease. Should I place order for CT abdomen/pelvis with contrast or w/o contrast? Please advise

## 2019-08-30 NOTE — Telephone Encounter (Signed)
Patient scheduled on 09/05/19

## 2019-08-30 NOTE — Telephone Encounter (Signed)
Order placed, number given to patient to call [facility] to schedule.

## 2019-09-04 ENCOUNTER — Encounter: Payer: Self-pay | Admitting: Gynecology

## 2019-09-04 DIAGNOSIS — M48061 Spinal stenosis, lumbar region without neurogenic claudication: Secondary | ICD-10-CM | POA: Diagnosis not present

## 2019-09-04 DIAGNOSIS — M47816 Spondylosis without myelopathy or radiculopathy, lumbar region: Secondary | ICD-10-CM | POA: Diagnosis not present

## 2019-09-04 DIAGNOSIS — M545 Low back pain: Secondary | ICD-10-CM | POA: Diagnosis not present

## 2019-09-05 ENCOUNTER — Ambulatory Visit
Admission: RE | Admit: 2019-09-05 | Discharge: 2019-09-05 | Disposition: A | Discharge status: Home / Self Care | Payer: Medicare Other | Source: Ambulatory Visit | Attending: Obstetrics and Gynecology | Admitting: Obstetrics and Gynecology

## 2019-09-05 ENCOUNTER — Other Ambulatory Visit: Payer: Self-pay

## 2019-09-05 DIAGNOSIS — R1900 Intra-abdominal and pelvic swelling, mass and lump, unspecified site: Secondary | ICD-10-CM

## 2019-09-05 DIAGNOSIS — K5730 Diverticulosis of large intestine without perforation or abscess without bleeding: Secondary | ICD-10-CM | POA: Diagnosis not present

## 2019-09-05 DIAGNOSIS — K4090 Unilateral inguinal hernia, without obstruction or gangrene, not specified as recurrent: Secondary | ICD-10-CM | POA: Diagnosis not present

## 2019-09-06 ENCOUNTER — Other Ambulatory Visit: Payer: Medicare Other

## 2019-09-06 ENCOUNTER — Encounter: Payer: Self-pay | Admitting: Nurse Practitioner

## 2019-09-06 ENCOUNTER — Encounter: Payer: Self-pay | Admitting: Family Medicine

## 2019-09-06 ENCOUNTER — Telehealth: Payer: Self-pay | Admitting: *Deleted

## 2019-09-06 ENCOUNTER — Ambulatory Visit (INDEPENDENT_AMBULATORY_CARE_PROVIDER_SITE_OTHER): Payer: Medicare Other | Admitting: Nurse Practitioner

## 2019-09-06 VITALS — BP 126/70 | HR 68 | Temp 96.8°F | Ht 65.5 in | Wt 147.0 lb

## 2019-09-06 DIAGNOSIS — Z01818 Encounter for other preprocedural examination: Secondary | ICD-10-CM | POA: Diagnosis not present

## 2019-09-06 DIAGNOSIS — D509 Iron deficiency anemia, unspecified: Secondary | ICD-10-CM | POA: Diagnosis not present

## 2019-09-06 DIAGNOSIS — R195 Other fecal abnormalities: Secondary | ICD-10-CM

## 2019-09-06 MED ORDER — NA SULFATE-K SULFATE-MG SULF 17.5-3.13-1.6 GM/177ML PO SOLN: ORAL | 0 refills | Status: DC

## 2019-09-06 NOTE — Telephone Encounter (Signed)
-----   Message from Joseph Pierini, MD sent at 09/06/2019  9:06 AM EST ----- Please let Sandra Craig know there is a small hernia identified on the CT that is the cause for the bulge she noticed in the groin area.  I would recommend consultation with a general surgeon regarding this.  She also has multiple kidney cysts.  I would recommend she discuss this with her primary doctor regarding need for any further evaluation or follow up.   Thank you.

## 2019-09-06 NOTE — Telephone Encounter (Signed)
Patient scheduled on 09/13/19 with Dr.Wilson

## 2019-09-06 NOTE — Telephone Encounter (Signed)
Patient informed with below, referral placed at [facility] surgery they will call to schedule.   Patient informed to follow up with Dr. Yong Channel at [place] regarding kidney cysts. Scan in epic for his review as well.

## 2019-09-06 NOTE — Patient Instructions (Addendum)
If you are age 78 or older, your body mass index should be between 23-30. Your Body mass index is 24.09 kg/m. If this is out of the aforementioned range listed, please consider follow up with your Primary Care Provider.  If you are age 58 or younger, your body mass index should be between 19-25. Your Body mass index is 24.09 kg/m. If this is out of the aformentioned range listed, please consider follow up with your Primary Care Provider.   You have been scheduled for an endoscopy and colonoscopy. Please follow the written instructions given to you at your visit today. Please pick up your prep supplies at the pharmacy within the next 1-3 days. If you use inhalers (even only as needed), please bring them with you on the day of your procedure. Your physician has requested that you go to www.startemmi.com and enter the access code given to you at your visit today. This web site gives a general overview about your procedure. However, you should still follow specific instructions given to you by our office regarding your preparation for the procedure.  We have sent the following medications to your pharmacy for you to pick up at your convenience: [place] [facility] provider has requested that you go to the basement level for lab work before leaving today. Press "B" on the elevator. The lab is located at the first door on the left as you exit the elevator.   Take Slow Fe twice daily.  HOLD FIVE DAYS PRIOR TO PROCEDURE.  Thank you for choosing me and [facility].   Tye Savoy, NP

## 2019-09-06 NOTE — Progress Notes (Signed)
Please let Sandra Craig know there is a small hernia identified on the CT that is the cause for the bulge she noticed in the groin area.  I would recommend consultation with a general surgeon regarding this.  She also has multiple kidney cysts.  I would recommend she discuss this with her primary doctor regarding need for any further evaluation or follow up.   Thank you.

## 2019-09-06 NOTE — Addendum Note (Signed)
Addended by: Yetta Flock on: 09/06/2019 08:58 PM   Modules accepted: Level of Service

## 2019-09-06 NOTE — Progress Notes (Signed)
Agree with assessment and plan as outlined.  

## 2019-09-06 NOTE — Progress Notes (Signed)
ASSESSMENT / PLAN:   76. 79 yo female with iron deficiency anemia. Hgb stable in 11-12 range but ferritin is 7.8. She has a positive FIT test. No focal GI symptoms -Would be due for screening colonoscopy in 2023 but given above, we will proceed earlier. The risks and benefits of colonoscopy with possible polypectomy / biopsies were discussed and the patient agrees to proceed.  -FIT used to detect colonic source of blood loss but given iron deficiency will consent her for EGD as well.  Sounds like she has a history of iron deficiency requiring iron infusions at one point -Ttg, IgA -slow fe BID, hold a week prior to colonoscopy  2. 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  She is followed by Dr. Lamonte Sakai who has referred her to ID to discuss if treatment should be pursued and which regimen.    HPI:     Chief Complaint:   PCP found blood in stool  Sandra Craig is a 79 y.o. female with a pmh significant for but not limited to hypertension, MAIC  osteoporosis, small bowel obstruction, CVA, diverticulosis  Sandra Craig is referred for evaluation of anemia and positive fecal occult blood test.  She gives a history of anemia requiring IV iron infusions years ago.  She also used to take oral Slow Fe.  At baseline hemoglobin is around 12, in October it declined to 11 and immunochemical fecal occult blood study was positive.  Of note her hemoglobin has spontaneously improved to 12.1.  Sandra Craig has not had any overt GI bleeding.  She has no focal GI symptoms such as abdominal pain or bowel changes.  No nausea or vomiting.  No NSAID use.  She is not due for next colon cancer screening until year 2023  Patient saw gynecologist late last month, complained of a knot in mons pubis area.  Noncontrast CT scan of the abdomen and pelvis was ordered and showed a small fat-containing hernia associated with mild stranding in the upper portion of the  right inguinal canal with incipient herniation of small bowel in the area, diverticulosis, multiple bilateral renal cysts.  She has been referred to [facility]   Data Reviewed:      Past Medical History:  Diagnosis Date  . 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 Chronic insomnia   . Cystitis   . Diverticulitis of intestine without perforation or abscess without bleeding    Patient did have abscess but noperforation  . Diverticulosis of colon (without mention of hemorrhage)   . Endometriosis   . Family history of malignant neoplasm of gastrointestinal tract   . Fibromyalgia   . Gastritis   . Hiatal hernia   . HIATAL HERNIA 10/08/2008   Qualifier: Diagnosis of  By: Nils Pyle CMA ([place]), Mearl Latin    . History of gallstones   . Hyperlipidemia   . Hypertension   . IBS (irritable bowel syndrome)   . IC (interstitial cystitis)   . Internal hemorrhoid   . Osteonecrosis ([place])   . Osteopenia   . Osteoporosis   . Palpitations   . Polycystic kidney disease   . Pulmonary nodule 12/08   5 mm Anterior RUL  . Small bowel obstruction ([place])   . Stroke [facility])      Past Surgical History:  Procedure Laterality Date  . ABDOMINAL HYSTERECTOMY  1994   TAH,BSO FOR ENDOMETRIOSIS  .  ABDOMINAL SURGERY  2011   small intestine blockage  . CHOLECYSTECTOMY    . GASTROPLASTY  2011   small bowel resection -open  . JOINT REPLACEMENT  2008  . OOPHORECTOMY  1994   TAH,BSO  . PELVIC LAPAROSCOPY    . S/P right shoulder rotater cuff  200216/2011   Tear/adhesive capsulitis  . SBO Lap  11   Adhesions and small internal hernia  . SHOULDER HEMI-ARTHROPLASTY  06/05/2012   Procedure: SHOULDER HEMI-ARTHROPLASTY;  Surgeon: Johnny Bridge, MD;  Location: [place];  Service: Orthopedics;  Laterality: Right;  FOR ARTHRITIS  . TOTAL HIP ARTHROPLASTY  FALL OF 2008   rt. partial hip replacement  . TOTAL SHOULDER ARTHROPLASTY  06/05/2012   Procedure: TOTAL SHOULDER ARTHROPLASTY;  Surgeon: Johnny Bridge, MD;   Location: [place];  Service: Orthopedics;  Laterality: Right;  RIGHT SHOULDER TOTAL ARTHROPLASTY, HEMIARTHROPLASTY, SHOULDER, FOR ARTHRITIS  . VIDEO BRONCHOSCOPY Bilateral 01/28/2015   Procedure: VIDEO BRONCHOSCOPY WITH FLUORO;  Surgeon: Collene Gobble, MD;  Location: [place];  Service: Cardiopulmonary;  Laterality: Bilateral;  . VIDEO BRONCHOSCOPY Bilateral 04/23/2019   Procedure: VIDEO BRONCHOSCOPY WITHOUT FLUORO;  Surgeon: Collene Gobble, MD;  Location[facility] ENDOSCOPY;  Service: Cardiopulmonary;  Laterality: Bilateral;   Family History  Problem Relation Age of Onset  . Heart disease Mother        MI at age 78  . Emphysema Mother   . Thyroid disease Mother        Thyroidectomy/Benign  . Lymphoma Maternal Grandmother   . Colon cancer Paternal Grandmother   . COPD Father   . Cancer Brother        adenocarcinoma right lung  . COPD Brother   . Pancreatic cancer Sister         either in [place] or [place]- need to clarify  . Heart attack Paternal Grandfather    Social History   Tobacco Use  . Smoking status: Former Smoker    Packs/day: 0.75    Years: 8.00    Pack years: 6.00    Types: Cigarettes    Quit date: 08/01/1976    Years since quitting: 43.1  . Smokeless tobacco: Never Used  . Tobacco comment: Quit in 1978  Substance Use Topics  . Alcohol use: No    Alcohol/week: 0.0 standard drinks  . Drug use: No   Current Outpatient Medications  Medication Sig Dispense Refill  . amLODipine (NORVASC) 2.5 MG tablet Take 1 tablet (2.5 mg total) by mouth daily. 90 tablet 3  . aspirin 81 MG tablet Take 1 tablet (81 mg total) by mouth daily. 30 tablet   . calcium-vitamin D 250-100 MG-UNIT tablet Take 1 tablet by mouth 2 (two) times daily.    Marland Kitchen denosumab (PROLIA) 60 MG/ML SOSY injection Inject 60 mg into the skin every 6 (six) months.    . DULoxetine (CYMBALTA) 30 MG capsule Take 1 capsule (30 mg total) by mouth 2 (two) times daily. 90 capsule 3  . famotidine (PEPCID) 20 MG tablet Take 1  tablet (20 mg total) by mouth 2 (two) times daily. 180 tablet 3  . gabapentin (NEURONTIN) 300 MG capsule Take 1 capsule (300 mg total) by mouth 2 (two) times daily as needed (pain). TAKE 1 CAPSULE(300 MG) BY MOUTH TWICE DAILY AS NEEDED 180 capsule 3  . Polyethyl Glycol-Propyl Glycol (SYSTANE) 0.4-0.3 % GEL ophthalmic gel Place 1 application into both eyes 3 (three) times daily as needed (dry eye).    . rosuvastatin (CRESTOR)  20 MG tablet Take 1 tablet (20 mg total) by mouth daily. 90 tablet 3  . zolpidem (AMBIEN) 5 MG tablet TAKE 1 TABLET BY MOUTH EVERY DAY AT BEDTIME FOR SLEEP 90 tablet 1  . Na Sulfate-K Sulfate-Mg Sulf 17.5-3.13-1.6 GM/177ML SOLN Suprep-Use as directed 354 mL 0   No current facility-administered medications for this visit.   Allergies  Allergen Reactions  . Azithromycin Rash    Pruritic rash diffuse   . Celecoxib Nausea Only  . Sulfonamide Derivatives Rash     Review of Systems: Positive for back pain, fatigue and frequent urination.  All other systems reviewed negative except where noted in the HPI   Creatinine clearance cannot be calculated (Patient's most recent lab result is older than the maximum 21 days allowed.)   Physical Exam:    Wt Readings from Last 3 Encounters:  09/06/19 147 lb (66.7 kg)  08/01/19 146 lb (66.2 kg)  05/20/19 143 lb (64.9 kg)    BP 126/70   Pulse 68   Temp (!) 96.8 F (36 C)   Ht 5' 5.5" (1.664 m)   Wt 147 lb (66.7 kg)   LMP 08/25/1992   SpO2 98%   BMI 24.09 kg/m  Constitutional:  Pleasant female in no acute distress. Psychiatric: Normal mood and affect. Behavior is normal. EENT: Pupils normal.  Conjunctivae are normal. No scleral icterus. Neck supple.  Cardiovascular: Normal rate, regular rhythm. No edema Pulmonary/chest: Effort normal and breath sounds normal. No wheezing, rales or rhonchi. Abdominal: Soft, nondistended, nontender. Bowel sounds active throughout. There are no masses palpable. No  hepatomegaly. Neurological: Alert and oriented to person place and time. Skin: Skin is warm and dry. No rashes noted.  Tye Savoy, NP  09/06/2019, 5:03 PM  Cc:  Referring Provider [name], MD

## 2019-09-09 ENCOUNTER — Encounter: Payer: Self-pay | Admitting: Gastroenterology

## 2019-09-09 LAB — IGG: IgG (Immunoglobin G), Serum: 892 mg/dL (ref 600–1540)

## 2019-09-09 LAB — TISSUE TRANSGLUTAMINASE, IGA: (tTG) Ab, IgA: 1 U/mL

## 2019-09-10 ENCOUNTER — Other Ambulatory Visit: Payer: Self-pay

## 2019-09-10 ENCOUNTER — Ambulatory Visit (INDEPENDENT_AMBULATORY_CARE_PROVIDER_SITE_OTHER): Payer: Medicare Other

## 2019-09-10 DIAGNOSIS — Z1159 Encounter for screening for other viral diseases: Secondary | ICD-10-CM | POA: Diagnosis not present

## 2019-09-11 LAB — SARS CORONAVIRUS 2 (TAT 6-24 HRS): SARS Coronavirus 2: NEGATIVE

## 2019-09-12 ENCOUNTER — Other Ambulatory Visit: Payer: Self-pay

## 2019-09-12 ENCOUNTER — Ambulatory Visit (AMBULATORY_SURGERY_CENTER): Payer: Medicare Other | Admitting: Gastroenterology

## 2019-09-12 ENCOUNTER — Encounter: Payer: Self-pay | Admitting: Gastroenterology

## 2019-09-12 VITALS — BP 144/75 | HR 66 | Temp 96.0°F | Resp 15 | Ht 65.0 in | Wt 147.0 lb

## 2019-09-12 DIAGNOSIS — D120 Benign neoplasm of cecum: Secondary | ICD-10-CM

## 2019-09-12 DIAGNOSIS — K5730 Diverticulosis of large intestine without perforation or abscess without bleeding: Secondary | ICD-10-CM

## 2019-09-12 DIAGNOSIS — R195 Other fecal abnormalities: Secondary | ICD-10-CM | POA: Diagnosis not present

## 2019-09-12 DIAGNOSIS — D125 Benign neoplasm of sigmoid colon: Secondary | ICD-10-CM

## 2019-09-12 DIAGNOSIS — K3189 Other diseases of stomach and duodenum: Secondary | ICD-10-CM

## 2019-09-12 DIAGNOSIS — K648 Other hemorrhoids: Secondary | ICD-10-CM

## 2019-09-12 DIAGNOSIS — K449 Diaphragmatic hernia without obstruction or gangrene: Secondary | ICD-10-CM | POA: Diagnosis not present

## 2019-09-12 DIAGNOSIS — D509 Iron deficiency anemia, unspecified: Secondary | ICD-10-CM | POA: Diagnosis not present

## 2019-09-12 DIAGNOSIS — D123 Benign neoplasm of transverse colon: Secondary | ICD-10-CM | POA: Diagnosis not present

## 2019-09-12 MED ORDER — SODIUM CHLORIDE 0.9 % IV SOLN: 500.0000 mL | Freq: Once | INTRAVENOUS | Status: DC

## 2019-09-12 NOTE — Op Note (Signed)
[facility] Patient Name: Sandra Craig Procedure Date: 09/12/2019 2:44 PM MRN: [id number] Endoscopist: Remo Lipps P. Havery Moros , MD Age: 79 Referring MD:  Date of Birth: May 22, 1941 Gender: Female Account #: [id number] Procedure:                Colonoscopy Indications:              Heme positive stool, Iron deficiency anemia Medicines:                Monitored Anesthesia Care Procedure:                Pre-Anesthesia Assessment:                           - Prior to the procedure, a History and Physical                            was performed, and patient medications and                            allergies were reviewed. The patient's tolerance of                            previous anesthesia was also reviewed. The risks                            and benefits of the procedure and the sedation                            options and risks were discussed with the patient.                            All questions were answered, and informed consent                            was obtained. Prior Anticoagulants: The patient has                            taken no previous anticoagulant or antiplatelet                            agents. ASA Grade Assessment: III - A patient with                            severe systemic disease. After reviewing the risks                            and benefits, the patient was deemed in                            satisfactory condition to undergo the procedure.                           After obtaining informed consent, the colonoscope  was passed under direct vision. Throughout the                            procedure, the patient's blood pressure, pulse, and                            oxygen saturations were monitored continuously. The                            Colonoscope was introduced through the anus and                            advanced to the the terminal ileum, with                            identification of  the appendiceal orifice and IC                            valve. The colonoscopy was technically difficult                            and complex due to a tortuous colon. The patient                            tolerated the procedure well. The quality of the                            bowel preparation was adequate. The terminal ileum,                            ileocecal valve, appendiceal orifice, and rectum                            were photographed. Scope In: 2:58:31 PM Scope Out: 3:33:52 PM Scope Withdrawal Time: 0 hours 28 minutes 23 seconds  Total Procedure Duration: 0 hours 35 minutes 21 seconds  Findings:                 The perianal and digital rectal examinations were                            normal.                           The terminal ileum appeared normal.                           The colon was grossly tortuous. The sigmoid colon                            had multiple sharply angulated turns in the setting                            of diverticulosis which prolonged this exam.  Two sessile polyps were found in the cecum. The                            polyps were diminutive in size. These polyps were                            removed with a cold snare. Resection and retrieval                            were complete.                           Seven sessile polyps were found in the transverse                            colon. The polyps were 3 to 6 mm in size. These                            polyps were removed with a cold snare. Resection                            and retrieval were complete.                           A 4 mm polyp was found in the sigmoid colon. The                            polyp was sessile. The polyp was removed with a                            cold snare. Resection and retrieval were complete.                           Many small-mouthed diverticula were found in the                            sigmoid colon.                            Internal hemorrhoids were found during retroflexion.                           The exam was otherwise without abnormality. Complications:            No immediate complications. Estimated blood loss:                            Minimal. Estimated Blood Loss:     Estimated blood loss was minimal. Impression:               - The examined portion of the ileum was normal.                           - Tortuous colon.                           -  Two diminutive polyps in the cecum, removed with                            a cold snare. Resected and retrieved.                           - Seven 3 to 6 mm polyps in the transverse colon,                            removed with a cold snare. Resected and retrieved.                           - One 4 mm polyp in the sigmoid colon, removed with                            a cold snare. Resected and retrieved.                           - Diverticulosis in the sigmoid colon.                           - Internal hemorrhoids.                           - The examination was otherwise normal.                           No obvious cause for iron deficiency on colonoscopy. Recommendation:           - Patient has a contact number available for                            emergencies. The signs and symptoms of potential                            delayed complications were discussed with the                            patient. Return to normal activities tomorrow.                            Written discharge instructions were provided to the                            patient.                           - Resume previous diet.                           - Continue present medications.                           - Await pathology results.                           -  See EGD note for further recommendations Remo Lipps P. Meldon Hanzlik, MD 09/12/2019 3:39:52 PM This report has been signed electronically.

## 2019-09-12 NOTE — Op Note (Signed)
[place] Patient Name: Sandra Craig Procedure Date: 09/12/2019 2:44 PM MRN: [id number] Endoscopist: Remo Lipps P. Havery Moros , MD Age: 79 Referring MD:  Date of Birth: 04-30-1941 Gender: Female Account #: [id number] Procedure:                Upper GI endoscopy Indications:              Iron deficiency anemia Medicines:                Monitored Anesthesia Care Procedure:                Pre-Anesthesia Assessment:                           - Prior to the procedure, a History and Physical                            was performed, and patient medications and                            allergies were reviewed. The patient's tolerance of                            previous anesthesia was also reviewed. The risks                            and benefits of the procedure and the sedation                            options and risks were discussed with the patient.                            All questions were answered, and informed consent                            was obtained. Prior Anticoagulants: The patient has                            taken no previous anticoagulant or antiplatelet                            agents. ASA Grade Assessment: III - A patient with                            severe systemic disease. After reviewing the risks                            and benefits, the patient was deemed in                            satisfactory condition to undergo the procedure.                           After obtaining informed consent, the endoscope was  passed under direct vision. Throughout the                            procedure, the patient's blood pressure, pulse, and                            oxygen saturations were monitored continuously. The                            Endoscope was introduced through the mouth, and                            advanced to the second part of duodenum. The upper                            GI endoscopy was  accomplished without difficulty.                            The patient tolerated the procedure well. Scope In: Scope Out: Findings:                 Very prominent vascular markings, possible                            hemagiomas, were noted in the posterior pharynx.                           Esophagogastric landmarks were identified: the                            Z-line was found at 34 cm, the gastroesophageal                            junction was found at 34 cm and the upper extent of                            the gastric folds was found at 35 cm from the                            incisors.                           A 1 cm hiatal hernia was present.                           The exam of the esophagus was otherwise normal.                           Patchy mildly erythematous mucosa was found in the                            gastric antrum.                           The exam of the stomach  was otherwise normal. No                            ulceration or erosions noted.                           Biopsies were taken with a cold forceps in the                            gastric body, at the incisura and in the gastric                            antrum for Helicobacter pylori testing.                           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 Biopsied to ensure no                            adenomatous change. The remainder of the examined                            duodenum was normal. Biopsies for histology were                            taken with a cold forceps for evaluation of celiac                            disease. Complications:            No immediate complications. Estimated blood loss:                            Minimal. Estimated Blood Loss:     Estimated blood loss was minimal. Impression:               - Possible hemangiomas versus prominent vascular                            markings in the  posterior pharynx.                           - Esophagogastric landmarks identified.                           - 1 cm hiatal hernia.                           - Erythematous mucosa in the antrum.                           - Normal stomach otherwise - biopsies obtained to  rule out H pylori.                           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 Biopsied.                           - Normal examined duodenum otherwise. Biopsied.                           No significant pathology in the upper GI tract to                            cause anemia but will await biopsy results. Unclear                            if findings in the posterior pharynx could be                            related, may wish to consider ENT evaluation if not                            yet done. Would also consider capsule endoscopy                            pending her course of no other obvious cause found                            and anemia persists despite iron. Recommendation:           - Patient has a contact number available for                            emergencies. The signs and symptoms of potential                            delayed complications were discussed with the                            patient. Return to normal activities tomorrow.                            Written discharge instructions were provided to the                            patient.                           - Resume previous diet.                           - Continue present medications.                           -  Await pathology results.                           - Consideration for ENT consult and capsule                            endoscopy, will discuss with the patient Sandra Craig. Armbruster, MD 09/12/2019 3:48:13 PM This report has been signed electronically.

## 2019-09-12 NOTE — Progress Notes (Signed)
Temp check by:JB Vital check by:DT  The medical and surgical history was reviewed and verified with the patient. 

## 2019-09-12 NOTE — Progress Notes (Signed)
Pt tolerated well. VSS. Awake and to recovery. 

## 2019-09-12 NOTE — Patient Instructions (Addendum)
YOU HAD AN ENDOSCOPIC PROCEDURE TODAY AT [facility]:   Refer to the procedure report that was given to you for any specific questions about what was found during the examination.  If the procedure report does not answer your questions, please call your gastroenterologist to clarify.  If you requested that your care partner not be given the details of your procedure findings, then the procedure report has been included in a sealed envelope for you to review at your convenience later.  YOU SHOULD EXPECT: Some feelings of bloating in the abdomen. Passage of more gas than usual.  Walking can help get rid of the air that was put into your GI tract during the procedure and reduce the bloating. If you had a lower endoscopy (such as a colonoscopy or flexible sigmoidoscopy) you may notice spotting of blood in your stool or on the toilet paper. If you underwent a bowel prep for your procedure, you may not have a normal bowel movement for a few days.  Please Note:  You might notice some irritation and congestion in your nose or some drainage.  This is from the oxygen used during your procedure.  There is no need for concern and it should clear up in a day or so.  SYMPTOMS TO REPORT IMMEDIATELY:   Following lower endoscopy (colonoscopy or flexible sigmoidoscopy):  Excessive amounts of blood in the stool  Significant tenderness or worsening of abdominal pains  Swelling of the abdomen that is new, acute  Fever of 100F or higher   Following upper endoscopy (EGD)  Vomiting of blood or coffee ground material  New chest pain or pain under the shoulder blades  Painful or persistently difficult swallowing  New shortness of breath  Fever of 100F or higher  Black, tarry-looking stools  For urgent or emergent issues, a gastroenterologist can be reached at any hour by calling [phone].   DIET:  We do recommend a small meal at first, but then you may proceed to your regular diet.  Drink  plenty of fluids but you should avoid alcoholic beverages for 24 hours.  MEDICATIONS: Continue present medications. Continue Iron twice daily.  Please see handouts given to you by your recovery nurse.  Follow Up: Consideration for ENT (Ear Nose and Throat) consult and capsule endoscopy.  ACTIVITY:  You should plan to take it easy for the rest of today and you should NOT DRIVE or use heavy machinery until tomorrow (because of the sedation medicines used during the test).    FOLLOW UP: Our staff will call the number listed on your records 48-72 hours following your procedure to check on you and address any questions or concerns that you may have regarding the information given to you following your procedure. If we do not reach you, we will leave a message.  We will attempt to reach you two times.  During this call, we will ask if you have developed any symptoms of COVID 19. If you develop any symptoms (ie: fever, flu-like symptoms, shortness of breath, cough etc.) before then, please call [phone].  If you test positive for Covid 19 in the 2 weeks post procedure, please call and report this information to [place].    If any biopsies were taken you will be contacted by phone or by letter within the next 1-3 weeks.  Please call us at [phone] if you have not heard about the biopsies in 3 weeks.   Thank you for allowing [place] to provide  for your healthcare needs today.   SIGNATURES/CONFIDENTIALITY: You and/or your care partner have signed paperwork which will be entered into your electronic medical record.  These signatures attest to the fact that that the information above on your After Visit Summary has been reviewed and is understood.  Full responsibility of the confidentiality of this discharge information lies with you and/or your care-partner.

## 2019-09-12 NOTE — Progress Notes (Signed)
Called to room to assist during endoscopic procedure.  Patient ID and intended procedure confirmed with present staff. Received instructions for my participation in the procedure from the performing physician.  

## 2019-09-14 DIAGNOSIS — M47819 Spondylosis without myelopathy or radiculopathy, site unspecified: Secondary | ICD-10-CM | POA: Insufficient documentation

## 2019-09-16 ENCOUNTER — Telehealth: Payer: Self-pay

## 2019-09-16 NOTE — Telephone Encounter (Signed)
NO ANSWER, MESSAGE LEFT FOR PATIENT. 

## 2019-09-16 NOTE — Telephone Encounter (Signed)
2nd follow up call made.  NALM 

## 2019-09-21 DIAGNOSIS — L03221 Cellulitis of neck: Secondary | ICD-10-CM | POA: Diagnosis not present

## 2019-09-21 DIAGNOSIS — B001 Herpesviral vesicular dermatitis: Secondary | ICD-10-CM | POA: Diagnosis not present

## 2019-09-24 ENCOUNTER — Ambulatory Visit (INDEPENDENT_AMBULATORY_CARE_PROVIDER_SITE_OTHER): Payer: Medicare Other | Admitting: Family Medicine

## 2019-09-24 ENCOUNTER — Encounter: Payer: Self-pay | Admitting: Family Medicine

## 2019-09-24 ENCOUNTER — Ambulatory Visit: Payer: Medicare Other | Admitting: Gastroenterology

## 2019-09-24 DIAGNOSIS — R21 Rash and other nonspecific skin eruption: Secondary | ICD-10-CM | POA: Diagnosis not present

## 2019-09-24 NOTE — Progress Notes (Signed)
   Sandra Craig is a 79 y.o. female who presents today for a virtual office visit.  Assessment/Plan:  New/Acute Problems: Rash Appearance consistent with shingles.  She is already on Valtrex.  Advised her to finish complete course of this.  Reassured patient.  She voiced understanding.  Discussed reasons to return to care.  Follow-up as needed.    Subjective:  HPI:  Started having a rash 3 days ago on neck. [facility] and was diagnosed with shingles vs cellulitis. She was started on valtrex and keflex.  Symptoms seem to be improving.  She has significant pain to the rash with very slight touch.  She has scabbed over lesions now.  No new spots popping up.  She has very mild drainage at one end of the rash.  No reported fevers or chills.       Objective/Observations  Physical Exam: Gen: NAD, resting comfortably Pulm: Normal work of breathing Neuro: Grossly normal, moves all extremities Psych: Normal affect and thought content Skin: Erythematous rash on right anterior neck in dermatomal distribution with overlying vesicles.   Virtual Visit via Video   I connected with Sandra Craig on 09/24/19 at 11:20 AM EST by a video enabled telemedicine application and verified that I am speaking with the correct person using two identifiers. The limitations of evaluation and management by telemedicine and the availability of in person appointments were discussed. The patient expressed understanding and agreed to proceed.   Patient location: Home Provider location: [place] participating in the virtual visit: Myself and Patient     Algis Greenhouse. Jerline Pain, MD 09/24/2019 11:36 AM

## 2019-09-26 ENCOUNTER — Telehealth: Payer: Self-pay | Admitting: Family Medicine

## 2019-09-26 NOTE — Telephone Encounter (Signed)
Pt called stating she saw a note saying she was overdue for a lab. Pt was not sure what it was. Please advise.

## 2019-09-27 ENCOUNTER — Other Ambulatory Visit: Payer: Self-pay

## 2019-09-27 ENCOUNTER — Encounter: Payer: Self-pay | Admitting: Physician Assistant

## 2019-09-27 ENCOUNTER — Ambulatory Visit (INDEPENDENT_AMBULATORY_CARE_PROVIDER_SITE_OTHER): Payer: Medicare Other | Admitting: Physician Assistant

## 2019-09-27 VITALS — BP 160/90 | HR 76 | Temp 97.2°F | Ht 65.0 in | Wt 148.5 lb

## 2019-09-27 DIAGNOSIS — B028 Zoster with other complications: Secondary | ICD-10-CM

## 2019-09-27 NOTE — Progress Notes (Signed)
Sandra Craig is a 79 y.o. female here for a new problem.  I acted as a Education administrator for [employer], PA-C Anselmo Pickler, LPN  History of Present Illness:   Chief Complaint  Patient presents with  . Mass    HPI    Wound Patient reports that she has recently been treated for both shingles and possible cellulitis for the bottom R side of her neck. She continues on keflex and valtrex at this time. She has not been cleaning the clean consistently -- used neosporin at first and then tried to use Dawn and warm water. She is concerned because in the front of her neck at the bottom portion, she has a very tender area of swelling that is worsening since yesterday.  Area overall has decreased in redness. Denies: fevers, chills, n/v, malaise.  She had her 2nd Covid Vaccine today.  Past Medical History:  Diagnosis Date  . 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 Chronic insomnia   . Cystitis   . Diverticulitis of intestine without perforation or abscess without bleeding    Patient did have abscess but noperforation  . Diverticulosis of colon (without mention of hemorrhage)   . Endometriosis   . Family history of malignant neoplasm of gastrointestinal tract   . Fibromyalgia   . Gastritis   . Hiatal hernia   . HIATAL HERNIA 10/08/2008   Qualifier: Diagnosis of  By: Nils Pyle CMA ([place]), Mearl Latin    . History of gallstones   . Hyperlipidemia   . Hypertension   . IBS (irritable bowel syndrome)   . IC (interstitial cystitis)   . Internal hemorrhoid   . Osteonecrosis ([place])   . Osteopenia   . Osteoporosis   . Palpitations   . Polycystic kidney disease   . Pulmonary nodule 12/08   5 mm Anterior RUL  . Small bowel obstruction ([place])   . Stroke [facility])    TIA     Social History   Socioeconomic History  . Marital status: Widowed    Spouse name: Not on file  . Number of children: 0  . Years of education: Not on file  . Highest education level: Not on file  Occupational  History  . Occupation: Retired    Comment: Manufacturing engineer  Tobacco Use  . Smoking status: Former Smoker    Packs/day: 0.75    Years: 8.00    Pack years: 6.00    Types: Cigarettes    Quit date: 08/01/1976    Years since quitting: 43.1  . Smokeless tobacco: Never Used  . Tobacco comment: Quit in 1978  Substance and Sexual Activity  . Alcohol use: No    Alcohol/week: 0.0 standard drinks  . Drug use: No  . Sexual activity: Yes  Other Topics Concern  . Not on file  Social History Narrative   Enjoys reading    Social Determinants of Health   Financial Resource Strain:   . Difficulty of Paying Living Expenses: Not on file  Food Insecurity:   . Worried About Charity fundraiser in the Last Year: Not on file  . Ran Out of Food in the Last Year: Not on file  Transportation Needs:   . Lack of Transportation (Medical): Not on file  . Lack of Transportation (Non-Medical): Not on file  Physical Activity:   . Days of Exercise per Week: Not on file  . Minutes of Exercise per Session: Not on file  Stress:   . Feeling of  Stress : Not on file  Social Connections:   . Frequency of Communication with Friends and Family: Not on file  . Frequency of Social Gatherings with Friends and Family: Not on file  . Attends Religious Services: Not on file  . Active Member of Clubs or Organizations: Not on file  . Attends Archivist Meetings: Not on file  . Marital Status: Not on file  Intimate Partner Violence:   . Fear of Current or Ex-Partner: Not on file  . Emotionally Abused: Not on file  . Physically Abused: Not on file  . Sexually Abused: Not on file    Past Surgical History:  Procedure Laterality Date  . ABDOMINAL HYSTERECTOMY  1994   TAH,BSO FOR ENDOMETRIOSIS  . ABDOMINAL SURGERY  2011   small intestine blockage  . CHOLECYSTECTOMY    . GASTROPLASTY  2011   small bowel resection -open  . JOINT REPLACEMENT  2008  . OOPHORECTOMY  1994   TAH,BSO  . PELVIC LAPAROSCOPY     . S/P right shoulder rotater cuff  200216/2011   Tear/adhesive capsulitis  . SBO Lap  11   Adhesions and small internal hernia  . SHOULDER HEMI-ARTHROPLASTY  06/05/2012   Procedure: SHOULDER HEMI-ARTHROPLASTY;  Surgeon: Johnny Bridge, MD;  Location: [place];  Service: Orthopedics;  Laterality: Right;  FOR ARTHRITIS  . TOTAL HIP ARTHROPLASTY  FALL OF 2008   rt. partial hip replacement  . TOTAL SHOULDER ARTHROPLASTY  06/05/2012   Procedure: TOTAL SHOULDER ARTHROPLASTY;  Surgeon: Johnny Bridge, MD;  Location: [place];  Service: Orthopedics;  Laterality: Right;  RIGHT SHOULDER TOTAL ARTHROPLASTY, HEMIARTHROPLASTY, SHOULDER, FOR ARTHRITIS  . VIDEO BRONCHOSCOPY Bilateral 01/28/2015   Procedure: VIDEO BRONCHOSCOPY WITH FLUORO;  Surgeon: Collene Gobble, MD;  Location: [place];  Service: Cardiopulmonary;  Laterality: Bilateral;  . VIDEO BRONCHOSCOPY Bilateral 04/23/2019   Procedure: VIDEO BRONCHOSCOPY WITHOUT FLUORO;  Surgeon: Collene Gobble, MD;  Location: [facility];  Service: Cardiopulmonary;  Laterality: Bilateral;    Family History  Problem Relation Age of Onset  . Heart disease Mother        MI at age 52  . Emphysema Mother   . Thyroid disease Mother        Thyroidectomy/Benign  . Lymphoma Maternal Grandmother   . Colon cancer Paternal Grandmother   . COPD Father   . Cancer Brother        adenocarcinoma right lung  . COPD Brother   . Heart attack Paternal Grandfather   . Pancreatic cancer Sister   . Esophageal cancer Neg Hx   . Rectal cancer Neg Hx   . Stomach cancer Neg Hx     Allergies  Allergen Reactions  . Azithromycin Rash    Pruritic rash diffuse   . Celecoxib Nausea Only  . Sulfonamide Derivatives Rash    Current Medications:   Current Outpatient Medications:  .  amLODipine (NORVASC) 2.5 MG tablet, Take 1 tablet (2.5 mg total) by mouth daily., Disp: 90 tablet, Rfl: 3 .  aspirin 81 MG tablet, Take 1 tablet (81 mg total) by mouth daily., Disp: 30 tablet, Rfl:   .  calcium-vitamin D 250-100 MG-UNIT tablet, Take 1 tablet by mouth 2 (two) times daily., Disp: , Rfl:  .  cephALEXin (KEFLEX) 500 MG capsule, Take 500 mg by mouth 2 (two) times daily., Disp: , Rfl:  .  denosumab (PROLIA) 60 MG/ML SOSY injection, Inject 60 mg into the skin every 6 (six) months., Disp: , Rfl:  .  DULoxetine (CYMBALTA) 30 MG capsule, Take 1 capsule (30 mg total) by mouth 2 (two) times daily., Disp: 90 capsule, Rfl: 3 .  famotidine (PEPCID) 20 MG tablet, Take 1 tablet (20 mg total) by mouth 2 (two) times daily., Disp: 180 tablet, Rfl: 3 .  gabapentin (NEURONTIN) 300 MG capsule, Take 1 capsule (300 mg total) by mouth 2 (two) times daily as needed (pain). TAKE 1 CAPSULE(300 MG) BY MOUTH TWICE DAILY AS NEEDED, Disp: 180 capsule, Rfl: 3 .  Polyethyl Glycol-Propyl Glycol (SYSTANE) 0.4-0.3 % GEL ophthalmic gel, Place 1 application into both eyes 3 (three) times daily as needed (dry eye)., Disp: , Rfl:  .  rosuvastatin (CRESTOR) 20 MG tablet, Take 1 tablet (20 mg total) by mouth daily., Disp: 90 tablet, Rfl: 3 .  valACYclovir (VALTREX) 1000 MG tablet, Take 1,000 mg by mouth daily., Disp: , Rfl:  .  zolpidem (AMBIEN) 5 MG tablet, TAKE 1 TABLET BY MOUTH EVERY DAY AT BEDTIME FOR SLEEP, Disp: 90 tablet, Rfl: 1  Current Facility-Administered Medications:  .  0.9 %  sodium chloride infusion, 500 mL, Intravenous, Once, Armbruster, [name], MD   Review of Systems:   ROS  Negative unless otherwise specified per HPI.  Vitals:   Vitals:   09/27/19 1545  BP: (!) 160/90  Pulse: 76  Temp: (!) 97.2 F (36.2 C)  TempSrc: Temporal  SpO2: 96%  Weight: 148 lb 8 oz (67.4 kg)  Height: 5\' 5"  (1.651 m)     Body mass index is 24.71 kg/m.  Physical Exam:   Physical Exam Constitutional:      Appearance: She is well-developed.  HENT:     Head: Normocephalic and atraumatic.  Eyes:     Conjunctiva/sclera: Conjunctivae normal.  Pulmonary:     Effort: Pulmonary effort is normal.   Musculoskeletal:        General: Normal range of motion.     Cervical back: Normal range of motion and neck supple.  Skin:    General: Skin is warm and dry.     Comments: Linear area of erythema at lateral edge of base of R neck that extends to center of anterior neck, with significant amount of scabbing. Saline applied to remove scabs and clean area. Area appears to be healing well with no significant redness. Anterior central portion of neck with small raised area of significant tenderness, without fluctuance or erythema.  Neurological:     Mental Status: She is alert and oriented to person, place, and time.  Psychiatric:        Mood and Affect: Mood is anxious.        Behavior: Behavior normal.        Thought Content: Thought content normal.        Judgment: Judgment normal.      Assessment and Plan:   Laneka was seen today for mass.  Diagnoses and all orders for this visit:  Herpes zoster with other complication   Appears to be healing well. No red flags. I'm uncertain why she has a significant area of tenderness to the base of her wound. We did extensively review better care of the area, including mild soap and water daily. Follow-up for wound-check scheduled for patient next Tuesday with me. Advised Urgent Care over weekend if worsening symptoms or concerns for infection. Continue keflex and valtrex from prior prescribers.  . Reviewed expectations re: course of current medical issues. . Discussed self-management of symptoms. . Outlined signs and symptoms indicating need for more  acute intervention. . Patient verbalized understanding and all questions were answered. . See orders for this visit as documented in the electronic medical record. . Patient received an After-Visit Summary.  CMA or LPN served as scribe during this visit. History, Physical, and Plan performed by medical provider. The above documentation has been reviewed and is accurate and complete.   Inda Coke, PA-C

## 2019-09-27 NOTE — Telephone Encounter (Signed)
Left message on voicemail to call office. Please tell pt to disregard message lab was done with lab work in Oct.

## 2019-09-27 NOTE — Patient Instructions (Signed)
It was great to see you!  Keep area moist with either bactroban (ointment provided in office) or vaseline.  Use plain soap (NOT Dawn!) with warm water to keep area clean daily.  See me Tuesday at 10:30.  If concerns for worsening infection over the weekend, go to Urgent Care.   Take care,  Inda Coke PA-C

## 2019-09-27 NOTE — Telephone Encounter (Signed)
Pt came in for an appt and told her to disregard lab message. Lab was already done in Oct.

## 2019-09-30 NOTE — Progress Notes (Signed)
Sandra Craig is a 79 y.o. female is here to follow up on neck wound.  I acted as a Education administrator for [employer], PA-C Anselmo Pickler, LPN  History of Present Illness:   Chief Complaint  Patient presents with  . Wound Check    HPI   Wound check Pt here for follow up on neck wound, last seen by me on 09/27/19. Area has improved significantly but she states that the area is still very tender at some places and lump is still there. Pt has completed the antibiotic and has been cleaning the area methodically.  Denies: fevers, chills, discharge from wound, n/v, malaise  There are no preventive care reminders to display for this patient.  Past Medical History:  Diagnosis Date  . 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 Chronic insomnia   . Cystitis   . Diverticulitis of intestine without perforation or abscess without bleeding    Patient did have abscess but noperforation  . Diverticulosis of colon (without mention of hemorrhage)   . Endometriosis   . Family history of malignant neoplasm of gastrointestinal tract   . Fibromyalgia   . Gastritis   . Hiatal hernia   . HIATAL HERNIA 10/08/2008   Qualifier: Diagnosis of  By: Nils Pyle CMA ([place]), Mearl Latin    . History of gallstones   . Hyperlipidemia   . Hypertension   . IBS (irritable bowel syndrome)   . IC (interstitial cystitis)   . Internal hemorrhoid   . Osteonecrosis ([place])   . Osteopenia   . Osteoporosis   . Palpitations   . Polycystic kidney disease   . Pulmonary nodule 12/08   5 mm Anterior RUL  . Small bowel obstruction ([place])   . Stroke [facility])    TIA     Social History   Socioeconomic History  . Marital status: Widowed    Spouse name: Not on file  . Number of children: 0  . Years of education: Not on file  . Highest education level: Not on file  Occupational History  . Occupation: Retired    Comment: Manufacturing engineer  Tobacco Use  . Smoking status: Former Smoker    Packs/day: 0.75   Years: 8.00    Pack years: 6.00    Types: Cigarettes    Quit date: 08/01/1976    Years since quitting: 43.1  . Smokeless tobacco: Never Used  . Tobacco comment: Quit in 1978  Substance and Sexual Activity  . Alcohol use: No    Alcohol/week: 0.0 standard drinks  . Drug use: No  . Sexual activity: Yes  Other Topics Concern  . Not on file  Social History Narrative   Enjoys reading    Social Determinants of Health   Financial Resource Strain:   . Difficulty of Paying Living Expenses: Not on file  Food Insecurity:   . Worried About Charity fundraiser in the Last Year: Not on file  . Ran Out of Food in the Last Year: Not on file  Transportation Needs:   . Lack of Transportation (Medical): Not on file  . Lack of Transportation (Non-Medical): Not on file  Physical Activity:   . Days of Exercise per Week: Not on file  . Minutes of Exercise per Session: Not on file  Stress:   . Feeling of Stress : Not on file  Social Connections:   . Frequency of Communication with Friends and Family: Not on file  . Frequency of Social Gatherings with  Friends and Family: Not on file  . Attends Religious Services: Not on file  . Active Member of Clubs or Organizations: Not on file  . Attends Archivist Meetings: Not on file  . Marital Status: Not on file  Intimate Partner Violence:   . Fear of Current or Ex-Partner: Not on file  . Emotionally Abused: Not on file  . Physically Abused: Not on file  . Sexually Abused: Not on file    Past Surgical History:  Procedure Laterality Date  . ABDOMINAL HYSTERECTOMY  1994   TAH,BSO FOR ENDOMETRIOSIS  . ABDOMINAL SURGERY  2011   small intestine blockage  . CHOLECYSTECTOMY    . GASTROPLASTY  2011   small bowel resection -open  . JOINT REPLACEMENT  2008  . OOPHORECTOMY  1994   TAH,BSO  . PELVIC LAPAROSCOPY    . S/P right shoulder rotater cuff  200216/2011   Tear/adhesive capsulitis  . SBO Lap  11   Adhesions and small internal hernia  .  SHOULDER HEMI-ARTHROPLASTY  06/05/2012   Procedure: SHOULDER HEMI-ARTHROPLASTY;  Surgeon: Johnny Bridge, MD;  Location: [place];  Service: Orthopedics;  Laterality: Right;  FOR ARTHRITIS  . TOTAL HIP ARTHROPLASTY  FALL OF 2008   rt. partial hip replacement  . TOTAL SHOULDER ARTHROPLASTY  06/05/2012   Procedure: TOTAL SHOULDER ARTHROPLASTY;  Surgeon: Johnny Bridge, MD;  Location: [place];  Service: Orthopedics;  Laterality: Right;  RIGHT SHOULDER TOTAL ARTHROPLASTY, HEMIARTHROPLASTY, SHOULDER, FOR ARTHRITIS  . VIDEO BRONCHOSCOPY Bilateral 01/28/2015   Procedure: VIDEO BRONCHOSCOPY WITH FLUORO;  Surgeon: Collene Gobble, MD;  Location: [place];  Service: Cardiopulmonary;  Laterality: Bilateral;  . VIDEO BRONCHOSCOPY Bilateral 04/23/2019   Procedure: VIDEO BRONCHOSCOPY WITHOUT FLUORO;  Surgeon: Collene Gobble, MD;  Location: [facility] ENDOSCOPY;  Service: Cardiopulmonary;  Laterality: Bilateral;    Family History  Problem Relation Age of Onset  . Heart disease Mother        MI at age 68  . Emphysema Mother   . Thyroid disease Mother        Thyroidectomy/Benign  . Lymphoma Maternal Grandmother   . Colon cancer Paternal Grandmother   . COPD Father   . Cancer Brother        adenocarcinoma right lung  . COPD Brother   . Heart attack Paternal Grandfather   . Pancreatic cancer Sister   . Esophageal cancer Neg Hx   . Rectal cancer Neg Hx   . Stomach cancer Neg Hx     PMHx, SurgHx, SocialHx, FamHx, Medications, and Allergies were reviewed in the Visit Navigator and updated as appropriate.   Patient Active Problem List   Diagnosis Date Noted  . Facet arthropathy 09/14/2019  . Vaginal dryness 01/29/2019  . Major depressive disorder with single episode, in full remission ([place]) 01/29/2019  . UTI (urinary tract infection) 12/15/2018  . History of transient ischemic attack (TIA) 06/02/2017  . CKD (chronic kidney disease), stage III 06/02/2017  . Near syncope 03/11/2017  . 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 Chronic right shoulder pain 03/07/2014  . Chronic low back pain 12/20/2013  . Benign essential tremor 10/09/2013  . Hyperglycemia 06/27/2012  . Diverticulitis large intestine 02/11/2012  . Irritable bowel syndrome (IBS) 06/28/2011  . History of small bowel obstruction 06/28/2011  . 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 Polycystic kidney disease   . Hypertension   . Hyperlipidemia   . IC (interstitial cystitis)   . Osteoporosis 12/21/2009  .  INSOMNIA, CHRONIC 10/08/2008  . Hemorrhoids 10/08/2008  . 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 HEMATURIA UNSPECIFIED 07/04/2008  . Depression 02/08/2008  . Fibromyalgia 12/05/2007  . Lung nodules 08/09/2007    Social History   Tobacco Use  . Smoking status: Former Smoker    Packs/day: 0.75    Years: 8.00    Pack years: 6.00    Types: Cigarettes    Quit date: 08/01/1976    Years since quitting: 43.1  . Smokeless tobacco: Never Used  . Tobacco comment: Quit in 1978  Substance Use Topics  . Alcohol use: No    Alcohol/week: 0.0 standard drinks  . Drug use: No    Current Medications and Allergies:    Current Outpatient Medications:  .  amLODipine (NORVASC) 2.5 MG tablet, Take 1 tablet (2.5 mg total) by mouth daily., Disp: 90 tablet, Rfl: 3 .  aspirin 81 MG tablet, Take 1 tablet (81 mg total) by mouth daily., Disp: 30 tablet, Rfl:  .  calcium-vitamin D 250-100 MG-UNIT tablet, Take 1 tablet by mouth 2 (two) times daily., Disp: , Rfl:  .  denosumab (PROLIA) 60 MG/ML SOSY injection, Inject 60 mg into the skin every 6 (six) months., Disp: , Rfl:  .  DULoxetine (CYMBALTA) 30 MG capsule, Take 1 capsule (30 mg total) by mouth 2 (two) times daily., Disp: 90 capsule, Rfl: 3 .  famotidine (PEPCID) 20 MG tablet, Take 1 tablet (20 mg total) by mouth 2 (two) times daily., Disp: 180 tablet, Rfl: 3 .  gabapentin (NEURONTIN) 300 MG capsule, Take 1 capsule (300 mg total) by mouth 2  (two) times daily as needed (pain). TAKE 1 CAPSULE(300 MG) BY MOUTH TWICE DAILY AS NEEDED, Disp: 180 capsule, Rfl: 3 .  Polyethyl Glycol-Propyl Glycol (SYSTANE) 0.4-0.3 % GEL ophthalmic gel, Place 1 application into both eyes 3 (three) times daily as needed (dry eye)., Disp: , Rfl:  .  rosuvastatin (CRESTOR) 20 MG tablet, Take 1 tablet (20 mg total) by mouth daily., Disp: 90 tablet, Rfl: 3 .  valACYclovir (VALTREX) 1000 MG tablet, Take 1,000 mg by mouth daily., Disp: , Rfl:  .  zolpidem (AMBIEN) 5 MG tablet, TAKE 1 TABLET BY MOUTH EVERY DAY AT BEDTIME FOR SLEEP, Disp: 90 tablet, Rfl: 1  Current Facility-Administered Medications:  .  0.9 %  sodium chloride infusion, 500 mL, Intravenous, Once, Armbruster, [name], MD   Allergies  Allergen Reactions  . Azithromycin Rash    Pruritic rash diffuse   . Celecoxib Nausea Only  . Sulfonamide Derivatives Rash    Review of Systems   ROS  Negative unless otherwise specified per HPI.  Vitals:   Vitals:   10/01/19 1023  BP: 130/86  Pulse: 74  Temp: (!) 97.2 F (36.2 C)  TempSrc: Temporal  SpO2: 96%  Weight: 147 lb 4 oz (66.8 kg)  Height: 5\' 5"  (1.651 m)     Body mass index is 24.5 kg/m.   Physical Exam:    Physical Exam Constitutional:      Appearance: She is well-developed.  HENT:     Head: Normocephalic and atraumatic.  Eyes:     Conjunctiva/sclera: Conjunctivae normal.  Pulmonary:     Effort: Pulmonary effort is normal.  Musculoskeletal:        General: Normal range of motion.     Cervical back: Normal range of motion and neck supple.  Skin:    General: Skin is warm and dry.     Comments: Well  healing linear lesion at the base of right anterior and lateral neck. Well approximated. Small area of swelling at center area of lower neck with TTP.  Neurological:     Mental Status: She is alert and oriented to person, place, and time.  Psychiatric:        Behavior: Behavior normal.        Thought Content: Thought content  normal.        Judgment: Judgment normal.      Assessment and Plan:    Sandra Craig was seen today for wound check.  Diagnoses and all orders for this visit:  Herpes zoster with other complication   Overall improving. Briefly examined by Dr. Dimas Chyle. Will wait an additional week to see if she has further improvement of area of swelling, if no further improvement or any worsening, will obtain u/s and may consider oral abx if needed. Recommend continue excellent care of lesion and warm compresses. Red flags and worsening precautions advised.  . Reviewed expectations re: course of current medical issues. . Discussed self-management of symptoms. . Outlined signs and symptoms indicating need for more acute intervention. . Patient verbalized understanding and all questions were answered. . See orders for this visit as documented in the electronic medical record. . Patient received an After Visit Summary.  CMA or LPN served as scribe during this visit. History, Physical, and Plan performed by medical provider. The above documentation has been reviewed and is accurate and complete.   Inda Coke, PA-C [place], [place] 10/01/2019  Follow-up: No follow-ups on file.

## 2019-10-01 ENCOUNTER — Other Ambulatory Visit: Payer: Self-pay

## 2019-10-01 ENCOUNTER — Ambulatory Visit (INDEPENDENT_AMBULATORY_CARE_PROVIDER_SITE_OTHER): Payer: Medicare Other | Admitting: Physician Assistant

## 2019-10-01 ENCOUNTER — Telehealth: Payer: Self-pay

## 2019-10-01 ENCOUNTER — Encounter: Payer: Self-pay | Admitting: Physician Assistant

## 2019-10-01 VITALS — BP 130/86 | HR 74 | Temp 97.2°F | Ht 65.0 in | Wt 147.2 lb

## 2019-10-01 DIAGNOSIS — B028 Zoster with other complications: Secondary | ICD-10-CM | POA: Diagnosis not present

## 2019-10-01 NOTE — Telephone Encounter (Signed)
I believe the ultrasound that Sandra Craig is referring to is regarding a RENAL u/s for a renal cyst found on CT.   (This is separate issue from the area that we may ultrasound (NECK/SOFT TISSUE) from today.)

## 2019-10-01 NOTE — Telephone Encounter (Signed)
Sandra Craig Please clarify about ultrasound request as well as the question about pharynx hemangioma with patient and then let me know

## 2019-10-01 NOTE — Telephone Encounter (Signed)
Patient was seen by Sam today. 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

## 2019-10-01 NOTE — Patient Instructions (Signed)
It was great to see you!  Let's follow-up in 1 week from today, sooner if you have concerns.  Take care,  Inda Coke PA-C

## 2019-10-01 NOTE — Telephone Encounter (Signed)
I am not opposed to ordering this but it appears they had a discussion today with plan to check ultrasound if not continuing to improve.  Did something change since visit?  I am sending to [facility] as an FYI-greatly appreciate her seeing the patient  I did a chart search under "hemangioma" and do not see anything about this-could you please ask patient for more information

## 2019-10-02 ENCOUNTER — Telehealth: Payer: Self-pay | Admitting: Gastroenterology

## 2019-10-02 DIAGNOSIS — K4090 Unilateral inguinal hernia, without obstruction or gangrene, not specified as recurrent: Secondary | ICD-10-CM | POA: Diagnosis not present

## 2019-10-02 DIAGNOSIS — I10 Essential (primary) hypertension: Secondary | ICD-10-CM | POA: Diagnosis not present

## 2019-10-02 DIAGNOSIS — Z8673 Personal history of transient ischemic attack (TIA), and cerebral infarction without residual deficits: Secondary | ICD-10-CM | POA: Diagnosis not present

## 2019-10-02 NOTE — Telephone Encounter (Signed)
Patient calling- she had a colonoscopy with Dr. Havery Moros on 2/11 and on the notes that she got it says "Follow Up: Consideration for ENT consult and capsule endoscopy." She is asking what a capsule endoscopy is and if a referral will be sent for the ENT.

## 2019-10-03 ENCOUNTER — Other Ambulatory Visit: Payer: Self-pay

## 2019-10-03 DIAGNOSIS — M47816 Spondylosis without myelopathy or radiculopathy, lumbar region: Secondary | ICD-10-CM | POA: Diagnosis not present

## 2019-10-03 DIAGNOSIS — M545 Low back pain: Secondary | ICD-10-CM | POA: Diagnosis not present

## 2019-10-03 DIAGNOSIS — M461 Sacroiliitis, not elsewhere classified: Secondary | ICD-10-CM | POA: Diagnosis not present

## 2019-10-03 DIAGNOSIS — M48061 Spinal stenosis, lumbar region without neurogenic claudication: Secondary | ICD-10-CM | POA: Diagnosis not present

## 2019-10-03 NOTE — Telephone Encounter (Signed)
  Called patient back and let her know we did do an ENT referral on 09/20/19 to Dr. Melony Overly. I called to check the status of the referral and spoke to [facility] at Dr. Pollie Friar office. . She gave me an appt. for the patient on 10/10/19 @ 1:45pm. Will sent the information by MyChart also (per pt. Request). Also let her know after getting the path results back from her EGD/Colonoscopy, Dr. Havery Moros did not recommend a capsule endoscopy right now.

## 2019-10-04 ENCOUNTER — Ambulatory Visit (INDEPENDENT_AMBULATORY_CARE_PROVIDER_SITE_OTHER): Payer: Medicare Other | Admitting: Family Medicine

## 2019-10-04 ENCOUNTER — Encounter: Payer: Self-pay | Admitting: Family Medicine

## 2019-10-04 VITALS — BP 130/70 | HR 69 | Temp 97.3°F | Ht 65.5 in | Wt 146.4 lb

## 2019-10-04 DIAGNOSIS — D509 Iron deficiency anemia, unspecified: Secondary | ICD-10-CM

## 2019-10-04 DIAGNOSIS — I10 Essential (primary) hypertension: Secondary | ICD-10-CM | POA: Diagnosis not present

## 2019-10-04 DIAGNOSIS — K4090 Unilateral inguinal hernia, without obstruction or gangrene, not specified as recurrent: Secondary | ICD-10-CM

## 2019-10-04 DIAGNOSIS — E785 Hyperlipidemia, unspecified: Secondary | ICD-10-CM

## 2019-10-04 DIAGNOSIS — B028 Zoster with other complications: Secondary | ICD-10-CM

## 2019-10-04 DIAGNOSIS — R739 Hyperglycemia, unspecified: Secondary | ICD-10-CM

## 2019-10-04 DIAGNOSIS — N289 Disorder of kidney and ureter, unspecified: Secondary | ICD-10-CM

## 2019-10-04 NOTE — Telephone Encounter (Signed)
Patient has app today will review at intake

## 2019-10-04 NOTE — Patient Instructions (Addendum)
Schedule bloodwork in about 3 weeks. Continue iron  For the neck - lets ask Dr. Pollie Friar opinion- tell him I was leaning towards neck ultrasound- if he prefers for me to order this just send me a message.   We will call you within two weeks about your referral for ultrasound of kidneys to follow up on just over 1 cm lesion that looks slightly different form the other cysts. If you do not hear within 3 weeks, give [place] a call.   With home readings in 160s on blood pressure lets go back to full tablet amlodipine- if you even get a hint of lightheadedness or feeling like you may pass out please return to the half tablet. Also be careful with position changes  Team- please cancel visit with Sandra Craig on 10/08/19 as we went over all concerns today  Recommended follow up: 11/11/19- bring cuff with you to visit

## 2019-10-04 NOTE — Progress Notes (Signed)
Phone [phone] In person visit   Subjective:   Sandra Craig is a 79 y.o. year old very pleasant female patient who presents for/with See problem oriented charting Chief Complaint  Patient presents with  . Results    This visit occurred during the SARS-CoV-2 public health emergency.  Safety protocols were in place, including screening questions prior to the visit, additional usage of staff PPE, and extensive cleaning of exam room while observing appropriate contact time as indicated for disinfecting solutions.   Past Medical History-  Patient Active Problem List   Diagnosis Date Noted  . History of transient ischemic attack (TIA) 06/02/2017    Priority: High  . 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 Chronic low back pain 12/20/2013    Priority: High  . Fibromyalgia 12/05/2007    Priority: High  . UTI (urinary tract infection) 12/15/2018    Priority: Medium  . CKD (chronic kidney disease), stage III 06/02/2017    Priority: Medium  . Near syncope 03/11/2017    Priority: Medium  . History of small bowel obstruction 06/28/2011    Priority: Medium  . GERD with stricture 06/28/2011    Priority: Medium  . Polycystic kidney disease     Priority: Medium  . Hypertension     Priority: Medium  . Hyperlipidemia     Priority: Medium  . Osteoporosis 12/21/2009    Priority: Medium  . INSOMNIA, CHRONIC 10/08/2008    Priority: Medium  . Depression 02/08/2008    Priority: Medium  . Chronic right shoulder pain 03/07/2014    Priority: Low  . Benign essential tremor 10/09/2013    Priority: Low  . Hyperglycemia 06/27/2012    Priority: Low  . Diverticulitis large intestine 02/11/2012    Priority: Low  . Irritable bowel syndrome (IBS) 06/28/2011    Priority: Low  . IC (interstitial cystitis)     Priority: Low  . Hemorrhoids 10/08/2008    Priority: Low  . 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 HEMATURIA UNSPECIFIED 07/04/2008    Priority: Low  . Lung nodules 08/09/2007    Priority: Low  . Facet arthropathy 09/14/2019  . Vaginal dryness 01/29/2019  . Major depressive disorder with single episode, in full remission ([place]) 01/29/2019    Medications- reviewed and updated Current Outpatient Medications  Medication Sig Dispense Refill  . amLODipine (NORVASC) 2.5 MG tablet Take 1 tablet (2.5 mg total) by mouth daily. 90 tablet 3  . aspirin 81 MG tablet Take 1 tablet (81 mg total) by mouth daily. 30 tablet   . calcium-vitamin D 250-100 MG-UNIT tablet Take 1 tablet by mouth 2 (two) times daily.    Marland Kitchen denosumab (PROLIA) 60 MG/ML SOSY injection Inject 60 mg into the skin every 6 (six) months.    . DULoxetine (CYMBALTA) 30 MG capsule Take 1 capsule (30 mg total) by mouth 2 (two) times daily. 90 capsule 3  . famotidine (PEPCID) 20 MG tablet Take 1 tablet (20 mg total) by mouth 2 (two) times daily. 180 tablet 3  . gabapentin (NEURONTIN) 300 MG capsule Take 1 capsule (300 mg total) by mouth 2 (two) times daily as needed (pain). TAKE 1 CAPSULE(300 MG) BY MOUTH TWICE DAILY AS NEEDED 180 capsule 3  . Polyethyl Glycol-Propyl Glycol (SYSTANE) 0.4-0.3 % GEL ophthalmic gel Place 1 application into both eyes 3 (three) times daily as needed (dry eye).    . rosuvastatin (CRESTOR) 20  MG tablet Take 1 tablet (20 mg total) by mouth daily. 90 tablet 3  . zolpidem (AMBIEN) 5 MG tablet TAKE 1 TABLET BY MOUTH EVERY DAY AT BEDTIME FOR SLEEP 90 tablet 1  . predniSONE (STERAPRED UNI-PAK 48 TAB) 10 MG (48) TBPK tablet     . valACYclovir (VALTREX) 1000 MG tablet Take 1,000 mg by mouth daily.     Current Facility-Administered Medications  Medication Dose Route Frequency Provider Last Rate Last Admin  . 0.9 %  sodium chloride infusion  500 mL Intravenous Once Armbruster, [name], MD         Objective:  BP 130/70   Pulse 69   Temp (!) 97.3 F (36.3 C)   Ht 5' 5.5" (1.664 m)   Wt 146 lb 6.4 oz (66.4 kg)    LMP 08/25/1992   SpO2 99%   BMI 23.99 kg/m  Gen: NAD, resting comfortably Tympanic membrane normal bilaterally.  Patient on right neck has areas of erythema without obvious vesicles at this point-patient shows me prior pictures and appears drastically improved.  Toward the central portion does have a 1.5 cm subcutaneous lesion which is raised-moderately tender to palpation-no fluctuation noted. CV: RRR no murmurs rubs or gallops Lungs: CTAB no crackles, wheeze, rhonchi Abdomen: soft/nontender/nondistended/normal bowel sounds.  Ext: no edema Skin: warm, dry    Assessment and Plan    #Herpes zoster-on the neck approximately C3 distribution S: Patient has had 3 visits, for including today for rash on her neck.  Originally saw by Dr. Jerline Pain February 23-diagnosed as herpes zoster-patient had already been started on Valtrex and Keflex by [facility].  Patient returned on February 26 and March 2 and saw Inda Coke, PA-she has had more tenderness towards central portion of her neck with concern for possible cyst-no obvious abscess without fluctuance.  This area is about 1.5 cm-possibly cystic.Has not gotten bigger or smaller. Tenderness unchanged. Mild discomfort with swallowing but no pain otherwise- only with palpatoin.  Patient also has slight right earache A/P: Patient appears to have shingles in C3 distribution.  Overall seems to be healing by compared to prior pictures she shows me.  She does have a central portion that is about 1.5 cm that is tender to palpation and she states has not been improving.  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   There was no evidence of celiac disease.  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  Iron supplementation was recommended with repeat CBC and iron studies planned-it does not appear these were set up  From last EGD "-EGD and colonoscopy done to evaluate iron deficiency. I did not find any clear cause on these exams to account for her iron deficiency -Biopsies of her stomach and small intestine are normal -She did have multiple polyps removed in her colon, at least 6 of these are precancerous. All small. She would normally be due for another colonoscopy in 3 years, at that time she will be 79 years old. I do not feel that she needs another colonoscopy in this light in the  future but she can see me at that time to further discuss. -She did have very prominent vascular markings or hemangiomas in her posterior pharynx. Difficulty to visualize this well with an EGD scope. I am recommending an ENT evaluation to further evaluate this, unclear if this could be related to her anemia. Can you please refer her to ENT if she is willing -Can you clarify if she is taking any oral iron. If she is I like to repeat her CBC and iron studies in a few weeks. If she is not taking iron, please place her on ferrous sulfate 325 mg once daily, repeat CBC and iron studies in 6 weeks. Thanks" A/P: I am grateful for thorough GI evaluation.  With negative celiac testing, reassuring endoscopy other than pharyngeal hemangioma which she already has follow-up scheduled for with ENT, reassuring colonoscopy-I am okay with resuming management-I have ordered follow-up iron studies and she will come back for these. -Slow Fe 45 mg twice a day. Dark stool with iron   # Hyperglycemia/insulin resistance/prediabetes S: Patient has had increased risk of diabetes in the past Lab Results  Component Value  Date   HGBA1C 5.7 (H) 06/03/2017   HGBA1C 5.7 08/22/2012   A/P: Since we will be drawing blood work patient requested that I update her A1c-I think that is reasonable  #hyperlipidemia/history of TIA S: compliant with rosuvastatin 20 mg daily Lab Results  Component Value Date   CHOL 149 07/18/2018   HDL 66.70 07/18/2018   LDLCALC 69 07/18/2018   LDLDIRECT 67.0 01/29/2019   TRIG 63.0 07/18/2018   CHOLHDL 2 07/18/2018   A/P: LDL goal under 70 with history of TIA-we will update lipid panel when she comes back for blood work   #Musculoskeletal issues-patient working with orthopedics for continued issues with her back   #hypertension S: compliant with amlodipine 1.25 mg.  She reports most of her home blood pressure readings are in the 160s such as 164/74 earlier today BP Readings from Last 3 Encounters:  10/04/19 130/70  10/01/19 130/86  09/27/19 (!) 160/90  A/P: Patient has had a series with orthostasis in the past but I told her I still would like to keep home blood pressures at least below 150 so we opted to increase to 2.5 mg-she will follow-up next month with me-asked her to bring her home cuff   # Hernia  S:Patient has been evaluated by surgeon and has recommended surgery. History small bowel obstruction. Having tenderness over last month in are of hernia.  From ct 09/05/19 "Small fat containing hernia associated with mild stranding in the upper portion of the right inguinal canal. Incipient herniation of small bowel into this area." A/P: Patient asked my opinion about surgery-I told her with this already containing some small bowel I did not on his progress with risk of herniation and her history of small bowel obstruction.  She is able to complete 4 METS of activity without chest pain or shortness of breath so from my perspective would not require further cardiac clearance.  #Renal cyst/lesion S:Per CT on 09/05/2019 Patient would like to know if u/s is needed.  "Signs of multiple  bilateral renal cysts. The 1 potential exception is an 11 mm lesion with solid characteristics versus hemorrhagic cyst along the lower margin of the right kidney. Ultrasound follow-up may be helpful for further evaluation.  " A/P: I told patient I would recommend follow-up renal ultrasound based on prior imaging-this was ordered today  Recommended follow up: Has scheduled  follow-up next month-happy to see her sooner if needed Future Appointments  Date Time Provider [place]  10/10/2019  1:45 PM Rozetta Nunnery, MD [facility] None  10/25/2019  2:00 PM [facility]  11/11/2019  1:40 PM [name], MD [facility]    Lab/Order associations:   ICD-10-CM   1. Iron deficiency anemia, unspecified iron deficiency anemia type  D50.9 CBC with Differential/Platelet    IBC + Ferritin  2. Hyperglycemia  R73.9 Hemoglobin A1c  3. Hyperlipidemia, unspecified hyperlipidemia type  E78.5 CBC with Differential/Platelet    Comprehensive metabolic panel    Lipid panel  4. Renal lesion  N28.9 US Renal  5. Right inguinal hernia  K40.90   6. Essential hypertension  I10    Time Spent: 35 minutes of total time (3:59 PM- 4:34 PM) was spent on the date of the encounter performing the following actions: chart review prior to seeing the patient, obtaining history, performing a medically necessary exam, counseling on the treatment plan, placing orders, and documenting in our EHR.   Return precautions advised.  Garret Reddish, MD

## 2019-10-05 NOTE — Assessment & Plan Note (Signed)
S: compliant with amlodipine 1.25 mg.  She reports most of her home blood pressure readings are in the 160s such as 164/74 earlier today BP Readings from Last 3 Encounters:  10/04/19 130/70  10/01/19 130/86  09/27/19 (!) 160/90  A/P: Patient has had a series with orthostasis in the past but I told her I still would like to keep home blood pressures at least below 150 so we opted to increase to 2.5 mg-she will follow-up next month with me-asked her to bring her home cuff

## 2019-10-08 ENCOUNTER — Ambulatory Visit: Payer: Medicare Other | Admitting: Physician Assistant

## 2019-10-10 ENCOUNTER — Encounter (INDEPENDENT_AMBULATORY_CARE_PROVIDER_SITE_OTHER): Payer: Self-pay | Admitting: Otolaryngology

## 2019-10-10 ENCOUNTER — Ambulatory Visit (INDEPENDENT_AMBULATORY_CARE_PROVIDER_SITE_OTHER): Payer: Medicare Other | Admitting: Otolaryngology

## 2019-10-10 ENCOUNTER — Other Ambulatory Visit: Payer: Self-pay

## 2019-10-10 VITALS — Temp 97.9°F

## 2019-10-10 DIAGNOSIS — R229 Localized swelling, mass and lump, unspecified: Secondary | ICD-10-CM

## 2019-10-10 DIAGNOSIS — D1800 Hemangioma unspecified site: Secondary | ICD-10-CM

## 2019-10-10 DIAGNOSIS — M533 Sacrococcygeal disorders, not elsewhere classified: Secondary | ICD-10-CM | POA: Diagnosis not present

## 2019-10-10 NOTE — Progress Notes (Signed)
HPI: Sandra Craig is a 79 y.o. female who presents is referred by Dr. Yong Channel for evaluation of hemangioma noted on recent upper endoscopy by  gastroenterology.  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  She has also had a recent episode of shingles involving the dermatome on the right side of her neck of C3 and she has had a small nodule that has been painful on the right side of the neck at the anterior border this dermatome. She denies any bleeding from her mouth.  Past Medical History:  Diagnosis Date  . 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 Chronic insomnia   . Cystitis   . Diverticulitis of intestine without perforation or abscess without bleeding    Patient did have abscess but noperforation  . Diverticulosis of colon (without mention of hemorrhage)   . Endometriosis   . Family history of malignant neoplasm of gastrointestinal tract   . Fibromyalgia   . Gastritis   . Hiatal hernia   . HIATAL HERNIA 10/08/2008   Qualifier: Diagnosis of  By: Nils Pyle CMA ([place]), Mearl Latin    . History of gallstones   . Hyperlipidemia   . Hypertension   . IBS (irritable bowel syndrome)   . IC (interstitial cystitis)   . Internal hemorrhoid   . Osteonecrosis ([place])   . Osteopenia   . Osteoporosis   . Palpitations   . Polycystic kidney disease   . Pulmonary nodule 12/08   5 mm Anterior RUL  . Small bowel obstruction ([place])   . Stroke [facility])    TIA   Past Surgical History:  Procedure Laterality Date  . ABDOMINAL HYSTERECTOMY  1994   TAH,BSO FOR ENDOMETRIOSIS  . ABDOMINAL SURGERY  2011   small intestine blockage  . CHOLECYSTECTOMY    . GASTROPLASTY  2011   small bowel resection -open  . JOINT REPLACEMENT  2008  . OOPHORECTOMY  1994   TAH,BSO  . PELVIC LAPAROSCOPY    . S/P right shoulder rotater cuff  200216/2011   Tear/adhesive capsulitis  . SBO Lap  11   Adhesions and small internal hernia  . SHOULDER HEMI-ARTHROPLASTY  06/05/2012   Procedure: SHOULDER HEMI-ARTHROPLASTY;  Surgeon: Johnny Bridge, MD;  Location: [place];  Service: Orthopedics;  Laterality: Right;  FOR ARTHRITIS  . TOTAL HIP ARTHROPLASTY  FALL OF 2008   rt. partial hip replacement  . TOTAL SHOULDER ARTHROPLASTY  06/05/2012   Procedure: TOTAL SHOULDER ARTHROPLASTY;  Surgeon: Johnny Bridge, MD;  Location: [place];  Service: Orthopedics;  Laterality: Right;  RIGHT SHOULDER TOTAL ARTHROPLASTY, HEMIARTHROPLASTY, SHOULDER, FOR ARTHRITIS  . VIDEO BRONCHOSCOPY Bilateral 01/28/2015   Procedure: VIDEO BRONCHOSCOPY WITH FLUORO;  Surgeon: Collene Gobble, MD;  Location: [place];  Service: Cardiopulmonary;  Laterality: Bilateral;  . VIDEO BRONCHOSCOPY Bilateral 04/23/2019   Procedure: VIDEO BRONCHOSCOPY WITHOUT FLUORO;  Surgeon: Collene Gobble, MD;  Location: [facility];  Service: Cardiopulmonary;  Laterality: Bilateral;   Social History   Socioeconomic History  . Marital status: Widowed    Spouse name: Not on file  . Number of children: 0  . Years of education: Not on file  . Highest education level: Not on file  Occupational History  . Occupation: Retired    Comment: Manufacturing engineer  Tobacco Use  . Smoking status: Former Smoker    Packs/day: 0.75    Years: 8.00  Pack years: 6.00    Types: Cigarettes    Quit date: 08/01/1976    Years since quitting: 43.2  . Smokeless tobacco: Never Used  . Tobacco comment: Quit in 1978  Substance and Sexual Activity  . Alcohol use: No    Alcohol/week: 0.0 standard drinks  . Drug use: No  . Sexual activity: Yes  Other Topics Concern  . Not on file  Social History Narrative   Enjoys reading    Social Determinants of Health   Financial Resource Strain:   . Difficulty of Paying Living Expenses:   Food Insecurity:   . Worried About Charity fundraiser in the Last Year:   . Arboriculturist in the Last Year:   Transportation Needs:    . Film/video editor (Medical):   Marland Kitchen Lack of Transportation (Non-Medical):   Physical Activity:   . Days of Exercise per Week:   . Minutes of Exercise per Session:   Stress:   . Feeling of Stress :   Social Connections:   . Frequency of Communication with Friends and Family:   . Frequency of Social Gatherings with Friends and Family:   . Attends Religious Services:   . Active Member of Clubs or Organizations:   . Attends Archivist Meetings:   Marland Kitchen Marital Status:    Family History  Problem Relation Age of Onset  . Heart disease Mother        MI at age 54  . Emphysema Mother   . Thyroid disease Mother        Thyroidectomy/Benign  . Lymphoma Maternal Grandmother   . Colon cancer Paternal Grandmother   . COPD Father   . Cancer Brother        adenocarcinoma right lung  . COPD Brother   . Heart attack Paternal Grandfather   . Pancreatic cancer Sister   . Esophageal cancer Neg Hx   . Rectal cancer Neg Hx   . Stomach cancer Neg Hx    Allergies  Allergen Reactions  . Azithromycin Rash    Pruritic rash diffuse   . Celecoxib Nausea Only  . Sulfonamide Derivatives Rash   Prior to Admission medications   Medication Sig Start Date End Date Taking? Authorizing Provider  amLODipine (NORVASC) 2.5 MG tablet Take 1 tablet (2.5 mg total) by mouth daily. 07/25/19  Yes [name], MD  aspirin 81 MG tablet Take 1 tablet (81 mg total) by mouth daily. 01/23/18  Yes Frann Rider, NP  calcium-vitamin D 250-100 MG-UNIT tablet Take 1 tablet by mouth 2 (two) times daily.   Yes [provider]  denosumab (PROLIA) 60 MG/ML SOSY injection Inject 60 mg into the skin every 6 (six) months.   Yes [provider]  DULoxetine (CYMBALTA) 30 MG capsule Take 1 capsule (30 mg total) by mouth 2 (two) times daily. 07/25/19  Yes [name], MD  famotidine (PEPCID) 20 MG tablet Take 1 tablet (20 mg total) by mouth 2 (two) times daily. 07/25/19  Yes [name],  MD  gabapentin (NEURONTIN) 300 MG capsule Take 1 capsule (300 mg total) by mouth 2 (two) times daily as needed (pain). TAKE 1 CAPSULE(300 MG) BY MOUTH TWICE DAILY AS NEEDED 07/25/19  Yes [name], MD  Polyethyl Glycol-Propyl Glycol (SYSTANE) 0.4-0.3 % GEL ophthalmic gel Place 1 application into both eyes 3 (three) times daily as needed (dry eye).   Yes [provider]  predniSONE (STERAPRED UNI-PAK 48 TAB) 10  MG (48) TBPK tablet  10/04/19  Yes [provider]  rosuvastatin (CRESTOR) 20 MG tablet Take 1 tablet (20 mg total) by mouth daily. 07/25/19  Yes [name], MD  valACYclovir (VALTREX) 1000 MG tablet Take 1,000 mg by mouth daily. 09/22/19  Yes [provider]  zolpidem (AMBIEN) 5 MG tablet TAKE 1 TABLET BY MOUTH EVERY DAY AT BEDTIME FOR SLEEP 08/21/19  Yes [name], MD     Positive ROS: Otherwise negative.  No difficulty swallowing and no airway problems.  All other systems have been reviewed and were otherwise negative with the exception of those mentioned in the HPI and as above.  Physical Exam: Constitutional: Alert, well-appearing, no acute distress Ears: External ears without lesions or tenderness. Ear canals are clear bilaterally with intact, clear TMs.  Nasal: External nose without lesions. Clear nasal passages Oral: Lips and gums without lesions. Tongue and palate mucosa without lesions. Posterior oropharynx clear. On indirect laryngoscopy Sua has a hemangioma appearing fullness at the base of tongue with several dilated veins but no ulceration and no evidence of bleeding.  Palpation of this was soft with no palpable masses.  This is causing no airway problems or swallowing problems. Neck: No palpable adenopathy or masses.  She has a healing right neck shingles episode with a small subcutaneous palpable nodule measuring approximately 1 cm at the anterior margin of this dermatome that is slightly painful to palpation.  I suspect this  represents a neuroma or fibroma from the shingles that is subcutaneous and I would expect this to gradually resolve with time.  Could possibly represent a small lymph node as it is mobile and patient feels like it has gotten smaller. Respiratory: Breathing comfortably  Skin: No facial/neck lesions or rash noted.  Procedures  Assessment: Base of tongue hemangioma appearing mass which is not causing any airway problems or swallowing problems.  This is unlikely source of anemia as she has had no bleeding. The small neck nodule from the shingles is benign and I would expect this to gradually resolve with time.  Plan: Reviewed the above findings with the patient. She will follow-up as needed.   Radene Journey, MD   CC:

## 2019-10-14 ENCOUNTER — Ambulatory Visit
Admission: RE | Admit: 2019-10-14 | Discharge: 2019-10-14 | Disposition: A | Discharge status: Home / Self Care | Payer: Medicare Other | Source: Ambulatory Visit | Attending: Family Medicine | Admitting: Family Medicine

## 2019-10-14 DIAGNOSIS — N281 Cyst of kidney, acquired: Secondary | ICD-10-CM | POA: Diagnosis not present

## 2019-10-14 DIAGNOSIS — N289 Disorder of kidney and ureter, unspecified: Secondary | ICD-10-CM

## 2019-10-22 DIAGNOSIS — M48061 Spinal stenosis, lumbar region without neurogenic claudication: Secondary | ICD-10-CM | POA: Diagnosis not present

## 2019-10-22 DIAGNOSIS — M47816 Spondylosis without myelopathy or radiculopathy, lumbar region: Secondary | ICD-10-CM | POA: Diagnosis not present

## 2019-10-22 DIAGNOSIS — M545 Low back pain: Secondary | ICD-10-CM | POA: Diagnosis not present

## 2019-10-23 ENCOUNTER — Telehealth: Payer: Self-pay | Admitting: *Deleted

## 2019-10-23 DIAGNOSIS — M545 Low back pain: Secondary | ICD-10-CM | POA: Diagnosis not present

## 2019-10-23 DIAGNOSIS — M4316 Spondylolisthesis, lumbar region: Secondary | ICD-10-CM | POA: Diagnosis not present

## 2019-10-23 NOTE — Telephone Encounter (Addendum)
Benefits obtained from Index reference [phone]  Per amgen not covered so there was some discrepancy     Deductible N/A  OOP MAX $4200 ($890.26met)  Annual exam 05/13/2019 [place]  Calcium  9.3           Date 05/08/2019  Upcoming dental procedures   Prior Authorization needed NO  Pt estimated Cost $213   APPT 11/12/2019    Coverage Details: ONE DOSE 20%, 100% COVERED

## 2019-10-25 ENCOUNTER — Other Ambulatory Visit: Payer: Self-pay

## 2019-10-25 ENCOUNTER — Other Ambulatory Visit: Payer: Medicare Other

## 2019-10-25 ENCOUNTER — Other Ambulatory Visit (INDEPENDENT_AMBULATORY_CARE_PROVIDER_SITE_OTHER): Payer: Medicare Other

## 2019-10-25 DIAGNOSIS — E785 Hyperlipidemia, unspecified: Secondary | ICD-10-CM | POA: Diagnosis not present

## 2019-10-25 DIAGNOSIS — R739 Hyperglycemia, unspecified: Secondary | ICD-10-CM

## 2019-10-25 DIAGNOSIS — D509 Iron deficiency anemia, unspecified: Secondary | ICD-10-CM | POA: Diagnosis not present

## 2019-10-25 LAB — COMPREHENSIVE METABOLIC PANEL
ALT: 23 U/L (ref 0–35)
AST: 19 U/L (ref 0–37)
Albumin: 3.9 g/dL (ref 3.5–5.2)
Alkaline Phosphatase: 71 U/L (ref 39–117)
BUN: 19 mg/dL (ref 6–23)
CO2: 30 mEq/L (ref 19–32)
Calcium: 9 mg/dL (ref 8.4–10.5)
Chloride: 106 mEq/L (ref 96–112)
Creatinine, Ser: 1.4 mg/dL — ABNORMAL HIGH (ref 0.40–1.20)
GFR: 36.42 mL/min — ABNORMAL LOW (ref >60.00)
Glucose, Bld: 82 mg/dL (ref 70–99)
Potassium: 4.3 mEq/L (ref 3.5–5.1)
Sodium: 142 mEq/L (ref 135–145)
Total Bilirubin: 0.3 mg/dL (ref 0.2–1.2)
Total Protein: 6.1 g/dL (ref 6.0–8.3)

## 2019-10-25 LAB — CBC WITH DIFFERENTIAL/PLATELET
Basophils Absolute: 0 10*3/uL (ref 0.0–0.1)
Basophils Relative: 0.5 % (ref 0.0–3.0)
Eosinophils Absolute: 0.1 10*3/uL (ref 0.0–0.7)
Eosinophils Relative: 1.6 % (ref 0.0–5.0)
HCT: 36.7 % (ref 36.0–46.0)
Hemoglobin: 12 g/dL (ref 12.0–15.0)
Lymphocytes Relative: 24.4 % (ref 12.0–46.0)
Lymphs Abs: 1.9 10*3/uL (ref 0.7–4.0)
MCHC: 32.8 g/dL (ref 30.0–36.0)
MCV: 89.1 fl (ref 78.0–100.0)
Monocytes Absolute: 0.7 10*3/uL (ref 0.1–1.0)
Monocytes Relative: 9.7 % (ref 3.0–12.0)
Neutro Abs: 4.9 10*3/uL (ref 1.4–7.7)
Neutrophils Relative %: 63.8 % (ref 43.0–77.0)
Platelets: 165 10*3/uL (ref 150.0–400.0)
RBC: 4.12 Mil/uL (ref 3.87–5.11)
RDW: 18 % — ABNORMAL HIGH (ref 11.5–15.5)
WBC: 7.6 10*3/uL (ref 4.0–10.5)

## 2019-10-25 LAB — IBC + FERRITIN
Ferritin: 25.4 ng/mL (ref 10.0–291.0)
Iron: 49 ug/dL (ref 42–145)
Saturation Ratios: 13.1 % — ABNORMAL LOW (ref 20.0–50.0)
Transferrin: 267 mg/dL (ref 212.0–360.0)

## 2019-10-25 LAB — HEMOGLOBIN A1C: Hgb A1c MFr Bld: 5.7 % (ref 4.6–6.5)

## 2019-10-25 LAB — LIPID PANEL
Cholesterol: 149 mg/dL (ref 0–200)
HDL: 62 mg/dL (ref >39.00)
LDL Cholesterol: 70 mg/dL (ref 0–99)
NonHDL: 86.74
Total CHOL/HDL Ratio: 2
Triglycerides: 84 mg/dL (ref 0.0–149.0)
VLDL: 16.8 mg/dL (ref 0.0–40.0)

## 2019-10-29 DIAGNOSIS — M545 Low back pain: Secondary | ICD-10-CM | POA: Diagnosis not present

## 2019-10-29 DIAGNOSIS — M4316 Spondylolisthesis, lumbar region: Secondary | ICD-10-CM | POA: Diagnosis not present

## 2019-10-30 ENCOUNTER — Other Ambulatory Visit: Payer: Self-pay

## 2019-10-30 ENCOUNTER — Encounter: Payer: Self-pay | Admitting: Family Medicine

## 2019-10-30 DIAGNOSIS — D509 Iron deficiency anemia, unspecified: Secondary | ICD-10-CM

## 2019-10-30 DIAGNOSIS — M5416 Radiculopathy, lumbar region: Secondary | ICD-10-CM | POA: Diagnosis not present

## 2019-10-30 DIAGNOSIS — M545 Low back pain: Secondary | ICD-10-CM | POA: Diagnosis not present

## 2019-10-30 NOTE — Progress Notes (Signed)
Cbc

## 2019-11-06 DIAGNOSIS — M4316 Spondylolisthesis, lumbar region: Secondary | ICD-10-CM | POA: Diagnosis not present

## 2019-11-06 DIAGNOSIS — M545 Low back pain: Secondary | ICD-10-CM | POA: Diagnosis not present

## 2019-11-07 DIAGNOSIS — M545 Low back pain: Secondary | ICD-10-CM | POA: Diagnosis not present

## 2019-11-07 DIAGNOSIS — M47816 Spondylosis without myelopathy or radiculopathy, lumbar region: Secondary | ICD-10-CM | POA: Diagnosis not present

## 2019-11-11 ENCOUNTER — Encounter: Payer: Self-pay | Admitting: Family Medicine

## 2019-11-11 ENCOUNTER — Other Ambulatory Visit: Payer: Self-pay

## 2019-11-11 ENCOUNTER — Ambulatory Visit (INDEPENDENT_AMBULATORY_CARE_PROVIDER_SITE_OTHER): Payer: Medicare Other | Admitting: Family Medicine

## 2019-11-11 VITALS — BP 142/78 | HR 83 | Temp 98.7°F | Ht 65.5 in | Wt 149.0 lb

## 2019-11-11 DIAGNOSIS — R5383 Other fatigue: Secondary | ICD-10-CM

## 2019-11-11 DIAGNOSIS — F325 Major depressive disorder, single episode, in full remission: Secondary | ICD-10-CM | POA: Diagnosis not present

## 2019-11-11 DIAGNOSIS — R413 Other amnesia: Secondary | ICD-10-CM

## 2019-11-11 DIAGNOSIS — I10 Essential (primary) hypertension: Secondary | ICD-10-CM

## 2019-11-11 DIAGNOSIS — Z79899 Other long term (current) drug therapy: Secondary | ICD-10-CM

## 2019-11-11 DIAGNOSIS — N289 Disorder of kidney and ureter, unspecified: Secondary | ICD-10-CM

## 2019-11-11 MED ORDER — AMLODIPINE BESYLATE 2.5 MG PO TABS: 1.2500 mg | ORAL_TABLET | Freq: Every day | ORAL | 3 refills | Status: DC

## 2019-11-11 NOTE — Patient Instructions (Addendum)
Please stop by lab before you go If you do not have mychart- we will call you about results within 5 business days of [place] receiving them.  If you have mychart- we will send your results within 3 business days of [place] receiving them.  If abnormal or we want to clarify a result, we will call or mychart you to make sure you receive the message.  If you have questions or concerns or don't hear within 5 business days, please send [place] a message or call us.   No changes today since blood pressure is much better  I still think moving forward with hernia surgery is reasonable but you are the boss! If you have worsening pain seek care emergently.   #memory loss- patient is concerned about her memory. Progressive gradual  issues over last 1.5 years. Insomnia meds may contribute but we have had extreme difficulty finding an alternate. We opted for [place] neuropsychology referral to do more testing to evaluate memory. She states she has don ea lot of online tests and has progressivelyworsened. May need neurology input  We will call you within two weeks about your referral to neurology. If you do not hear within 3 weeks, give [place] a call.    Recommended follow up: Return in about 4 months (around 03/12/2020) for physical or sooner if needed.

## 2019-11-11 NOTE — Progress Notes (Signed)
Phone [phone] In person visit   Subjective:   Sandra Craig is a 79 y.o. year old very pleasant female patient who presents for/with See problem oriented charting Chief Complaint  Patient presents with  . Results    f/u on [place]   . Hypertension    This visit occurred during the SARS-CoV-2 public health emergency.  Safety protocols were in place, including screening questions prior to the visit, additional usage of staff PPE, and extensive cleaning of exam room while observing appropriate contact time as indicated for disinfecting solutions.   Past Medical History-  Patient Active Problem List   Diagnosis Date Noted  . History of transient ischemic attack (TIA) 06/02/2017    Priority: High  . 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 Chronic low back pain 12/20/2013    Priority: High  . Fibromyalgia 12/05/2007    Priority: High  . UTI (urinary tract infection) 12/15/2018    Priority: Medium  . CKD (chronic kidney disease), stage III 06/02/2017    Priority: Medium  . Near syncope 03/11/2017    Priority: Medium  . History of small bowel obstruction 06/28/2011    Priority: Medium  . GERD with stricture 06/28/2011    Priority: Medium  . Polycystic kidney disease     Priority: Medium  . Hypertension     Priority: Medium  . Hyperlipidemia     Priority: Medium  . Osteoporosis 12/21/2009    Priority: Medium  . INSOMNIA, CHRONIC 10/08/2008    Priority: Medium  . Depression 02/08/2008    Priority: Medium  . Chronic right shoulder pain 03/07/2014    Priority: Low  . Benign essential tremor 10/09/2013    Priority: Low  . Hyperglycemia 06/27/2012    Priority: Low  . Diverticulitis large intestine 02/11/2012    Priority: Low  . Irritable bowel syndrome (IBS) 06/28/2011    Priority: Low  . IC (interstitial cystitis)     Priority: Low  . Hemorrhoids 10/08/2008    Priority: Low  . 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 HEMATURIA UNSPECIFIED 07/04/2008    Priority: Low  . Lung nodules 08/09/2007    Priority: Low  . Facet arthropathy 09/14/2019  . Vaginal dryness 01/29/2019  . Major depressive disorder with single episode, in full remission ([place]) 01/29/2019    Medications- reviewed and updated Current Outpatient Medications  Medication Sig Dispense Refill  . amLODipine (NORVASC) 2.5 MG tablet Take 0.5 tablets (1.25 mg total) by mouth daily. 45 tablet 3  . aspirin 81 MG tablet Take 1 tablet (81 mg total) by mouth daily. 30 tablet   . calcium-vitamin D 250-100 MG-UNIT tablet Take 1 tablet by mouth 2 (two) times daily.    Marland Kitchen denosumab (PROLIA) 60 MG/ML SOSY injection Inject 60 mg into the skin every 6 (six) months.    . DULoxetine (CYMBALTA) 30 MG capsule Take 1 capsule (30 mg total) by mouth 2 (two) times daily. 90 capsule 3  . famotidine (PEPCID) 20 MG tablet Take 1 tablet (20 mg total) by mouth 2 (two) times daily. 180 tablet 3  . gabapentin (NEURONTIN) 300 MG capsule Take 1 capsule (300 mg total) by mouth 2 (two) times daily as needed (pain). TAKE 1 CAPSULE(300 MG) BY MOUTH TWICE DAILY AS NEEDED 180 capsule 3  . Polyethyl Glycol-Propyl Glycol (SYSTANE) 0.4-0.3 % GEL ophthalmic gel Place 1 application into both eyes 3 (three) times daily as  needed (dry eye).    . rosuvastatin (CRESTOR) 20 MG tablet Take 1 tablet (20 mg total) by mouth daily. 90 tablet 3  . zolpidem (AMBIEN) 5 MG tablet TAKE 1 TABLET BY MOUTH EVERY DAY AT BEDTIME FOR SLEEP 90 tablet 1   No current facility-administered medications for this visit.     Objective:  BP (!) 142/78   Pulse 83   Temp 98.7 F (37.1 C) (Temporal)   Ht 5' 5.5" (1.664 m)   Wt 149 lb (67.6 kg)   LMP 08/25/1992   SpO2 97%   BMI 24.42 kg/m  Gen: NAD, resting comfortably CV: RRR no murmurs rubs or gallops Lungs: CTAB no crackles, wheeze, rhonchi Abdomen: soft/nontender/nondistended/normal bowel sounds.  Ext: no edema  Skin: warm, dry    Assessment and Plan   # Renal US ultrasound S:Per U/S on 10/14/2019 IMPRESSION: 1. Cysts in the bilateral kidneys. Only single cyst seen on the left. 2. Area of concern not definitely visualized potentially seen as a 1 cm cyst though there are numerous other cyst in the right kidney. Consider a 3 to six-month follow-up with non contrasted MRI for comparison to the previous CT to ensure that the area in the interpolar right kidney can be definitively localized and compared. A/P:Renal cyst/lesion-  ultrasound was done in march- area not well definited- we opted to follow up around September- towards 6 month interval with MIR   # Hypertension- goal blood pressure at least <150/90 due to othostatic issues S: She is currently on Amlodipine 1.25 mg (we had planned 2.5 mg but I heavily emphasized pulling back if any lightheadedness and when she thought more about it based on prior syncopal episodes- she opted not to increase) . Patient denies any chest pain, shortness of breath, changes or changes in vision.   Home #s previously above 160- most recently. Home cuff 145/83 today and she states that is pretty typical now.  BP Readings from Last 3 Encounters:  11/11/19 (!) 142/78. Home cuff was 145/83 when we supported her arm like she would on her table at home.   10/04/19 130/70  10/01/19 130/86  A/P: thankfully #s look much better at home now in 140s. She does have history of TIA and would prefer under 140 but this may put her at serious risk for orthostatic syncope and she prefers lower dose. She will continue 1.25mg  unless blood pressure gets above 150/90  # Fatigue  S:Patient has had ongoing issues with this. Would like to have B-12 checked. A/P:  Due to history of taking PPIs in the past including nexium- we will use that as a reason to check b12 as it may have decreased absorption and caused low levels . We will also check TSH. CBC was recent so we will skip this and cmp  recent within 3 weeks so we will also skip. No chest pain or shortness of breath with fatigue.  -iron stores improving on slow iron twice a day a few weeks ago- she would like to do a recheck about halfway between then and next visit- we will order once we get labs back   # Right inguinal Hernia- patient declines surgeyr for right inguinal hernia at this time. See note 10/04/19. She currently plans to seek care emergently if pain worsens and does not relent.   # Depression S: doing well on cymbalta 30mg  twice daily Depression screen [facility] 2/9 10/04/2019  Decreased Interest 0  Down, Depressed, Hopeless 0  PHQ - 2 Score  0  Altered sleeping 0  Tired, decreased energy 3  Change in appetite 0  Feeling bad or failure about yourself  0  Trouble concentrating 0  Moving slowly or fidgety/restless 0  Suicidal thoughts 0  PHQ-9 Score 3  Difficult doing work/chores Not difficult at all  Some recent data might be hidden  A/P:full remission- continue current meds  #shingles- lingering mild pain at scar but otherwise doing well- neck finally healed up.   #she asks about positive FIT test- last cbc not anemic. Also had full GI workup with celiac testing, endoscopy, colonoscopy. She is on slow FE twice daily. We will recheck CBC and iron studies next visit  # hyperglycemia- a1c was stable at 5.7g  #memory loss- patient is concerned about her memory. Progressive gradual  issues over last 1.5 years. Insomnia meds may contribute but we have had extreme difficulty finding an alternate. We opted for [place] [facility] referral to do more testing to evaluate memory. She states she has don ea lot of online tests and has progressivelyworsened. May need neurology input - with history of prior TIA- we will also get MRI to make sure no stroke has caused this.  -low risk STDs- no HIV or RPR testing  - does not appear depression causing symptoms with low phq9  -check b12 and tsh  Recommended follow up:  Future  Appointments  Date Time Provider [place]  11/12/2019 12:00 PM [facility] NURSE [facility]   Lab/Order associations:   ICD-10-CM   1. Fatigue, unspecified type  R53.83 TSH  2. Major depressive disorder with single episode, in full remission ([place]) Chronic F32.5   3. Memory loss  R41.3 Ambulatory referral to Neuropsychology    MR Brain Wo Contrast  4. Renal lesion  N28.9   5. Essential hypertension  I10   6. High risk medication use  Z79.899 Vitamin B12    Meds ordered this encounter  Medications  . amLODipine (NORVASC) 2.5 MG tablet    Sig: Take 0.5 tablets (1.25 mg total) by mouth daily.    Dispense:  45 tablet    Refill:  3   Time Spent: 42 minutes of total time (2:11 PM- 2:53 PM) was spent on the date of the encounter performing the following actions: chart review prior to seeing the patient, obtaining history, performing a medically necessary exam, counseling on the treatment plan, placing orders, and documenting in our EHR.   Return precautions advised.  Garret Reddish, MD

## 2019-11-12 ENCOUNTER — Other Ambulatory Visit: Payer: Self-pay

## 2019-11-12 ENCOUNTER — Ambulatory Visit (INDEPENDENT_AMBULATORY_CARE_PROVIDER_SITE_OTHER): Payer: Medicare Other | Admitting: *Deleted

## 2019-11-12 DIAGNOSIS — M810 Age-related osteoporosis without current pathological fracture: Secondary | ICD-10-CM | POA: Diagnosis not present

## 2019-11-12 DIAGNOSIS — R413 Other amnesia: Secondary | ICD-10-CM

## 2019-11-12 LAB — TSH: TSH: 2.6 u[IU]/mL (ref 0.35–4.50)

## 2019-11-12 LAB — VITAMIN B12: Vitamin B-12: 318 pg/mL (ref 211–911)

## 2019-11-12 MED ORDER — DENOSUMAB 60 MG/ML ~~LOC~~ SOSY
60.0000 mg | PREFILLED_SYRINGE | Freq: Once | SUBCUTANEOUS | Status: AC
  Administered 2019-11-12: 60 mg via SUBCUTANEOUS

## 2019-11-14 DIAGNOSIS — M4316 Spondylolisthesis, lumbar region: Secondary | ICD-10-CM | POA: Diagnosis not present

## 2019-11-14 DIAGNOSIS — M545 Low back pain: Secondary | ICD-10-CM | POA: Diagnosis not present

## 2019-11-28 DIAGNOSIS — M545 Low back pain: Secondary | ICD-10-CM | POA: Diagnosis not present

## 2019-11-28 DIAGNOSIS — M4316 Spondylolisthesis, lumbar region: Secondary | ICD-10-CM | POA: Diagnosis not present

## 2019-12-02 DIAGNOSIS — M47816 Spondylosis without myelopathy or radiculopathy, lumbar region: Secondary | ICD-10-CM | POA: Diagnosis not present

## 2019-12-02 DIAGNOSIS — M4316 Spondylolisthesis, lumbar region: Secondary | ICD-10-CM | POA: Diagnosis not present

## 2019-12-02 DIAGNOSIS — M545 Low back pain: Secondary | ICD-10-CM | POA: Diagnosis not present

## 2019-12-18 DIAGNOSIS — M545 Low back pain: Secondary | ICD-10-CM | POA: Diagnosis not present

## 2019-12-18 DIAGNOSIS — M47816 Spondylosis without myelopathy or radiculopathy, lumbar region: Secondary | ICD-10-CM | POA: Diagnosis not present

## 2019-12-27 ENCOUNTER — Encounter: Payer: Self-pay | Admitting: Nurse Practitioner

## 2020-01-14 DIAGNOSIS — M545 Low back pain: Secondary | ICD-10-CM | POA: Diagnosis not present

## 2020-01-14 DIAGNOSIS — M47816 Spondylosis without myelopathy or radiculopathy, lumbar region: Secondary | ICD-10-CM | POA: Diagnosis not present

## 2020-01-15 ENCOUNTER — Ambulatory Visit: Payer: Self-pay | Admitting: General Surgery

## 2020-01-15 DIAGNOSIS — K4090 Unilateral inguinal hernia, without obstruction or gangrene, not specified as recurrent: Secondary | ICD-10-CM | POA: Diagnosis not present

## 2020-01-15 DIAGNOSIS — I10 Essential (primary) hypertension: Secondary | ICD-10-CM | POA: Diagnosis not present

## 2020-01-15 DIAGNOSIS — Z8673 Personal history of transient ischemic attack (TIA), and cerebral infarction without residual deficits: Secondary | ICD-10-CM | POA: Diagnosis not present

## 2020-01-15 NOTE — H&P (Signed)
History of Present Illness Sandra Ok MD; 01/15/2020 2:10 PM) The patient is a 79 year old female who presents with an inguinal hernia. Patient is a 79 year old female who comes in for evaluation of a right inguinal hernia. Patient states that she has noticed a small bulge the right inguinal area. She states that it's More prominent recently. Patient did have CT scan of the beginning year which showed a fat-containing hernia as well as a left-sided fat-containing hernia.  Patient states that she is having nerve ablation at [facility] clinic for back pain. She states that she has a history of a TIA however currently is only on aspirin.  She's had no signs or symptoms of incarceration or strangulation.  Patient's a previous ex-lap by Dr. Rise Patience for SBO.   -------------------------  She is referred by Dr. Delilah Shan for evaluation of a right inguinal hernia. She states that she is just had a lot going on over the past few months. She states that about one half months ago she saw a bulge in her right groin which prompted her to get it evaluated. She underwent a CT on February 4 which showed a fat-containing right inguinal hernia. She also had some renal cyst. She states she does not have any burning, shooting or stabbing pain in the groin. It occasionally aches. It is more noticeable toward the end of the day. It is not noticeable first thing in the morning. She recently developed rash on her right neck consistent with likely herpes zoster flare. It is resolving. She states that she had a TIA last November prompting hospitalization. She denies any current TIA or amaurosis fugax symptoms. She is on a baby aspirin. She has had a small bowel obstruction that required laparotomy by Dr. Rise Patience. She also had a case of diverticulitis 3 years ago. 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  I personally reviewed the CT scan from February 4.  Also reviewed the referring provider's note along with a daily medicine note from March 2.  I reviewed an upper endoscopy from February 11 which showed a 1 cm hiatal hernia. Some reddened mucosa in the antrum, vascular markings in the posterior pharynx. Also reviewed the colonoscopy from same day which showed some polyps in the cecum, transverse colon and sigmoid colon all of which were removed. She also some diverticulosis.  She states that she was admitted for her TIA but I can't find any records evident Epic or [facility] everywhere.   Allergies (Tanisha A. Owens Shark, [place]; 01/15/2020 1:44 PM) Azithromycin *MACROLIDES*  Rash. Celecoxib *ANALGESICS - ANTI-INFLAMMATORY*  Nausea. Sulfonamide Derivatives  Rash. Allergies Reconciled   Medication History (Tanisha A. Owens Shark, [place]; 01/15/2020 1:45 PM) amLODIPine Besylate (2.5MG Tablet, Oral) Active. Aspirin (81MG Tablet, Oral) Active. Calcium-Vitamin D (250MG Tablet, Oral) Active. Prolia (60MG/ML Soln Pref Syr, Subcutaneous) Active. DULoxetine HCl (30MG Capsule DR Part, Oral) Active. Famotidine (20MG Tablet, Oral) Active. Gabapentin (300MG Capsule, Oral) Active. Systane (0.4-0.3% Solution, Ophthalmic) Active. Zolpidem Tartrate (5MG Tablet, Oral) Active. Rosuvastatin Calcium (20MG Tablet, Oral) Active. Medications Reconciled    Review of Systems Sandra Ok, MD; 01/15/2020 2:13 PM) General Not Present- Appetite Loss, Chills, Fatigue, Fever, Night Sweats, Weight Gain and Weight Loss. Skin Present- New Lesions. Not Present- Change in Wart/Mole, Dryness, Hives, Jaundice, Non-Healing Wounds, Rash and Ulcer. HEENT Present- Hearing Loss and Wears glasses/contact lenses. Not Present- Earache, Hoarseness, Nose Bleed, Oral Ulcers, Ringing in the Ears, Seasonal Allergies, Sinus Pain, Sore Throat, Visual Disturbances and  Yellow Eyes. Breast Not Present- Breast Mass, Breast Pain, Nipple Discharge and Skin Changes. Cardiovascular Present-  Palpitations. Not Present- Chest Pain, Difficulty Breathing Lying Down, Leg Cramps, Rapid Heart Rate, Shortness of Breath and Swelling of Extremities. Gastrointestinal Present- Constipation, Hemorrhoids and Indigestion. Not Present- Abdominal Pain, Bloating, Bloody Stool, Change in Bowel Habits, Chronic diarrhea, Difficulty Swallowing, Excessive gas, Gets full quickly at meals, Nausea, Rectal Pain and Vomiting. Musculoskeletal Present- Back Pain, Joint Pain and Joint Stiffness. Not Present- Muscle Pain, Muscle Weakness and Swelling of Extremities. Neurological Present- Trouble walking. Not Present- Decreased Memory, Fainting, Headaches, Numbness, Seizures, Tingling, Tremor and Weakness. Psychiatric Not Present- Anxiety, Bipolar, Change in Sleep Pattern, Depression, Fearful and Frequent crying.  Vitals (Tanisha A. Brown RMA; 01/15/2020 1:45 PM) 01/15/2020 1:45 PM Weight: 145 lb Height: 65.5in Body Surface Area: 1.74 m Body Mass Index: 23.76 kg/m  Temp.: 98.68F  Pulse: 86 (Regular)  BP: 124/74(Sitting, Left Arm, Standard)       Physical Exam Sandra Ok MD; 01/15/2020 2:11 PM) The physical exam findings are as follows: Note: Constitutional: No acute distress, conversant, appears stated age  Eyes: Anicteric sclerae, moist conjunctiva, no lid lag  Neck: No thyromegaly, trachea midline, no cervical lymphadenopathy  Lungs: Clear to auscultation biilaterally, normal respiratory effot  Cardiovascular: regular rate & rhythm, no murmurs, no peripheal edema, pedal pulses 2+  GI: Soft, no masses or hepatosplenomegaly, non-tender to palpation right-sided palpable inguinal hernia, reducible  MSK: Normal gait, no clubbing cyanosis, edema  Skin: No rashes, palpation reveals normal skin turgor  Psychiatric: Appropriate judgment and insight, oriented to person, place, and time  Chaperone present-=diane    Assessment & Plan Sandra Ok MD; 01/15/2020 2:12 PM) RIGHT  INGUINAL HERNIA (K40.90) Impression: Patient is a 79 year old female with a right inguinal hernia. 1. The patient will like to proceed to the operating room for laparoscopic versus open right inguinal hernia repair with mesh.  2. I discussed with the patient the signs and symptoms of incarceration and strangulation and the need to proceed to the ER should they occur.  3. I discussed with the patient the risks and benefits of the procedure to include but not limited to: Infection, bleeding, damage to surrounding structures, possible need for further surgery, possible nerve pain, and possible recurrence. The patient was understanding and wishes to proceed. Current Plans Pt Education - Pamphlet Given - Hernia Surgery: discussed with patient and provided information. You are being scheduled for surgery- Our schedulers will call you.  You should hear from our office's scheduling department within 5 working days about the location, date, and time of surgery. We try to make accommodations for patient's preferences in scheduling surgery, but sometimes the OR schedule or the surgeon's schedule prevents [place] from making those accommodations.  If you have not heard from our office [phone]) in 5 working days, call the office and ask for your surgeon's nurse.  If you have other questions about your diagnosis, plan, or surgery, call the office and ask for your surgeon's nurse.  HISTORY OF TIA (TRANSIENT ISCHEMIC ATTACK) (Z86.73) HYPERTENSION, ESSENTIAL (I10)

## 2020-01-17 DIAGNOSIS — M545 Low back pain: Secondary | ICD-10-CM | POA: Diagnosis not present

## 2020-01-17 DIAGNOSIS — M47816 Spondylosis without myelopathy or radiculopathy, lumbar region: Secondary | ICD-10-CM | POA: Diagnosis not present

## 2020-02-10 ENCOUNTER — Ambulatory Visit: Payer: Medicare Other | Admitting: Nurse Practitioner

## 2020-02-10 ENCOUNTER — Other Ambulatory Visit: Payer: Self-pay

## 2020-02-10 ENCOUNTER — Other Ambulatory Visit: Payer: Self-pay | Admitting: Family Medicine

## 2020-02-10 ENCOUNTER — Encounter: Payer: Self-pay | Admitting: Nurse Practitioner

## 2020-02-10 VITALS — BP 110/78

## 2020-02-10 DIAGNOSIS — R350 Frequency of micturition: Secondary | ICD-10-CM | POA: Diagnosis not present

## 2020-02-10 DIAGNOSIS — N898 Other specified noninflammatory disorders of vagina: Secondary | ICD-10-CM

## 2020-02-10 LAB — WET PREP FOR TRICH, YEAST, CLUE

## 2020-02-10 NOTE — Telephone Encounter (Signed)
°  LAST APPOINTMENT DATE: 11/11/2019   NEXT APPOINTMENT DATE:@10 /15/2021  MEDICATION: zolpidem (AMBIEN) 5 MG tablet  PHARMACY:[id number] - [place], [place] - [address] AT [place]

## 2020-02-10 NOTE — Progress Notes (Signed)
   Acute Office Visit  Subjective:    Patient ID: Sandra Craig, female    DOB: 29-Aug-1940, 79 y.o.   MRN: [id number]   HPI 79 y.o. presents today for yellow vaginal discharge that she notices after intimacy. She also complains of burning with urination at times but thinks it is from irritation. Denies itching and odor, denies frequency, urgency, and hematuria. History of UTIs with suspicion being related to intercourse.    Review of Systems  Constitutional: Negative.   Gastrointestinal: Negative.   Genitourinary: Positive for dysuria and vaginal discharge (yellow). Negative for flank pain, frequency, urgency and vaginal bleeding.       Objective:    Physical Exam Constitutional:      Appearance: Normal appearance.  Abdominal:     General: Abdomen is flat.     Palpations: Abdomen is soft.     Tenderness: There is no right CVA tenderness or left CVA tenderness.  Genitourinary:    General: Normal vulva.     Vagina: Normal.     Comments: Uterus and cervix absent    BP 110/78 (BP Location: Right Arm, Patient Position: Sitting, Cuff Size: Normal)   LMP 08/25/1992  Wt Readings from Last 3 Encounters:  11/11/19 149 lb (67.6 kg)  10/04/19 146 lb 6.4 oz (66.4 kg)  10/01/19 147 lb 4 oz (66.8 kg)   Wet prep negative UA negative      Assessment & Plan:   Problem List Items Addressed This Visit    None    Visit Diagnoses    Vaginal discharge    -  Primary   Relevant Orders   WET PREP FOR [name], YEAST, CLUE   Urinary frequency       Relevant Orders   Urinalysis,Complete w/RFL Culture     Plan: Wet prep and UA unremarkable.  Reassurance provided on normal exam findings consistent with lab work.  Recommend using coconut oil or water-based lubricant during intimacy to prevent friction that may be causing irritation.  Follow-up as needed.  She is agreeable to plan.     [place], 3:16 PM 02/10/2020

## 2020-02-12 LAB — URINALYSIS, COMPLETE W/RFL CULTURE
Bilirubin Urine: NEGATIVE
Glucose, UA: NEGATIVE
Hyaline Cast: NONE SEEN /LPF
Ketones, ur: NEGATIVE
Leukocyte Esterase: NEGATIVE
Nitrites, Initial: NEGATIVE
Protein, ur: NEGATIVE
Specific Gravity, Urine: 1.015 (ref 1.001–1.03)
pH: 7.5 (ref 5.0–8.0)

## 2020-02-12 LAB — URINE CULTURE
MICRO NUMBER:: 10692800
SPECIMEN QUALITY:: ADEQUATE

## 2020-02-12 LAB — CULTURE INDICATED

## 2020-02-13 ENCOUNTER — Inpatient Hospital Stay (HOSPITAL_COMMUNITY): Admission: RE | Admit: 2020-02-13 | Payer: Medicare Other | Source: Ambulatory Visit

## 2020-02-13 DIAGNOSIS — D225 Melanocytic nevi of trunk: Secondary | ICD-10-CM | POA: Diagnosis not present

## 2020-02-13 DIAGNOSIS — L814 Other melanin hyperpigmentation: Secondary | ICD-10-CM | POA: Diagnosis not present

## 2020-02-13 DIAGNOSIS — D1801 Hemangioma of skin and subcutaneous tissue: Secondary | ICD-10-CM | POA: Diagnosis not present

## 2020-02-13 DIAGNOSIS — L821 Other seborrheic keratosis: Secondary | ICD-10-CM | POA: Diagnosis not present

## 2020-02-20 ENCOUNTER — Encounter (HOSPITAL_COMMUNITY): Payer: Self-pay

## 2020-02-20 ENCOUNTER — Emergency Department (HOSPITAL_COMMUNITY): Payer: Medicare Other

## 2020-02-20 ENCOUNTER — Emergency Department (HOSPITAL_COMMUNITY)
Admission: EM | Admit: 2020-02-20 | Discharge: 2020-02-20 | Disposition: A | Discharge status: Home / Self Care | Payer: Medicare Other | Attending: Emergency Medicine | Admitting: Emergency Medicine

## 2020-02-20 ENCOUNTER — Other Ambulatory Visit: Payer: Self-pay

## 2020-02-20 DIAGNOSIS — Z79899 Other long term (current) drug therapy: Secondary | ICD-10-CM | POA: Diagnosis not present

## 2020-02-20 DIAGNOSIS — Z8673 Personal history of transient ischemic attack (TIA), and cerebral infarction without residual deficits: Secondary | ICD-10-CM | POA: Diagnosis not present

## 2020-02-20 DIAGNOSIS — Z87891 Personal history of nicotine dependence: Secondary | ICD-10-CM | POA: Insufficient documentation

## 2020-02-20 DIAGNOSIS — I10 Essential (primary) hypertension: Secondary | ICD-10-CM | POA: Diagnosis not present

## 2020-02-20 DIAGNOSIS — I129 Hypertensive chronic kidney disease with stage 1 through stage 4 chronic kidney disease, or unspecified chronic kidney disease: Secondary | ICD-10-CM | POA: Insufficient documentation

## 2020-02-20 DIAGNOSIS — N183 Chronic kidney disease, stage 3 unspecified: Secondary | ICD-10-CM | POA: Diagnosis not present

## 2020-02-20 LAB — CBC
HCT: 42.4 % (ref 36.0–46.0)
Hemoglobin: 12.8 g/dL (ref 12.0–15.0)
MCH: 28.1 pg (ref 26.0–34.0)
MCHC: 30.2 g/dL (ref 30.0–36.0)
MCV: 93 fL (ref 80.0–100.0)
Platelets: 191 10*3/uL (ref 150–400)
RBC: 4.56 MIL/uL (ref 3.87–5.11)
RDW: 14.3 % (ref 11.5–15.5)
WBC: 6.6 10*3/uL (ref 4.0–10.5)
nRBC: 0 % (ref 0.0–0.2)

## 2020-02-20 LAB — BASIC METABOLIC PANEL
Anion gap: 9 (ref 5–15)
BUN: 20 mg/dL (ref 8–23)
CO2: 28 mmol/L (ref 22–32)
Calcium: 9.8 mg/dL (ref 8.9–10.3)
Chloride: 103 mmol/L (ref 98–111)
Creatinine, Ser: 1.58 mg/dL — ABNORMAL HIGH (ref 0.44–1.00)
GFR calc Af Amer: 36 mL/min — ABNORMAL LOW (ref >60)
GFR calc non Af Amer: 31 mL/min — ABNORMAL LOW (ref >60)
Glucose, Bld: 96 mg/dL (ref 70–99)
Potassium: 3.9 mmol/L (ref 3.5–5.1)
Sodium: 140 mmol/L (ref 135–145)

## 2020-02-20 LAB — TROPONIN I (HIGH SENSITIVITY)
Troponin I (High Sensitivity): 5 ng/L (ref <18)
Troponin I (High Sensitivity): 4 ng/L (ref <18)

## 2020-02-20 MED ORDER — AMLODIPINE BESYLATE 5 MG PO TABS
2.5000 mg | ORAL_TABLET | Freq: Once | ORAL | Status: AC
  Administered 2020-02-20: 2.5 mg via ORAL
  Filled 2020-02-20: qty 1

## 2020-02-20 MED ORDER — AMLODIPINE BESYLATE 2.5 MG PO TABS: 2.5000 mg | ORAL_TABLET | Freq: Every day | ORAL | 3 refills | Status: DC

## 2020-02-20 MED ORDER — SODIUM CHLORIDE 0.9% FLUSH: 3.0000 mL | Freq: Once | INTRAVENOUS | Status: DC

## 2020-02-20 NOTE — Discharge Instructions (Signed)
Increase your blood pressure medication to 2.5 mg daily.  Follow-up with your primary care doctor to have that rechecked

## 2020-02-20 NOTE — ED Triage Notes (Signed)
Pt presents to ED after being sent from dentist office for BP reading 190/116. Pt reports she takes enalapril for BP. She states she has been under a lot of stress and has been feeling anxious, but this is normal for her. PT BP 175/98 in triage. Pt denies CP, SOB, headache at any point today.

## 2020-02-20 NOTE — ED Provider Notes (Signed)
[facility] EMERGENCY DEPARTMENT Provider Note   CSN: [id number] Arrival date & time: 02/20/20  1146     History Chief Complaint  Patient presents with   Hypertension    Sandra Craig is a 79 y.o. female.  HPI   Pt presents to the ED for HTN.    SHe went to the dentist today and it was elevated, 190/116.  She denies having any symptoms with that.  She might have had some tight feeling in her throat but it was very mild and it has resolved.  NO cp.  No changes in medication or diet.  Normally bp is in the 140s.  She has been on the same medication for a couple of years.  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   Chronic insomnia    Cystitis    Diverticulitis of intestine without perforation or abscess without bleeding    Patient did have abscess but noperforation   Diverticulosis of colon (without mention of hemorrhage)    Endometriosis    Family history of malignant neoplasm of gastrointestinal tract    Fibromyalgia    Gastritis    Hiatal hernia    HIATAL HERNIA 10/08/2008   Qualifier: Diagnosis of  By: Nils Pyle CMA (AAMA), Mearl Latin     History of gallstones    Hyperlipidemia    Hypertension    IBS (irritable bowel syndrome)    IC (interstitial cystitis)    Internal hemorrhoid    Osteonecrosis ([place])    Osteopenia    Osteoporosis    Palpitations    Polycystic kidney disease    Pulmonary nodule 12/08   5 mm Anterior RUL   Small bowel obstruction ([place])    Stroke [facility])    TIA    Patient Active Problem List   Diagnosis Date Noted   Facet arthropathy 09/14/2019   Vaginal dryness 01/29/2019   Major depressive disorder with single episode, in full remission ([place]) 01/29/2019   UTI (urinary tract infection) 12/15/2018   History of transient ischemic attack (TIA) 06/02/2017   CKD (chronic kidney disease), stage III 06/02/2017   Near syncope 03/11/2017   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   Chronic right shoulder pain 03/07/2014   Chronic low back pain 12/20/2013   Benign essential tremor 10/09/2013   Hyperglycemia 06/27/2012   Diverticulitis large intestine 02/11/2012   Irritable bowel syndrome (IBS) 06/28/2011   History of small bowel obstruction 06/28/2011   GERD with stricture 06/28/2011   Polycystic kidney disease    Hypertension    Hyperlipidemia    IC (interstitial cystitis)    Osteoporosis 12/21/2009   INSOMNIA, CHRONIC 10/08/2008   Hemorrhoids 10/08/2008   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   HEMATURIA UNSPECIFIED 07/04/2008   Depression 02/08/2008   Fibromyalgia 12/05/2007   Lung nodules 08/09/2007    Past Surgical History:  Procedure Laterality Date   ABDOMINAL HYSTERECTOMY  1994   TAH,BSO FOR ENDOMETRIOSIS   ABDOMINAL SURGERY  2011   small intestine blockage   CHOLECYSTECTOMY     GASTROPLASTY  2011   small bowel resection -open   JOINT REPLACEMENT  2008   OOPHORECTOMY  1994   TAH,BSO   PELVIC LAPAROSCOPY     S/P right shoulder rotater cuff  200216/2011   Tear/adhesive capsulitis   SBO Lap  11   Adhesions and small internal hernia   SHOULDER HEMI-ARTHROPLASTY  06/05/2012   Procedure: SHOULDER HEMI-ARTHROPLASTY;  Surgeon: Johnny Bridge, MD;  Location: [place];  Service: Orthopedics;  Laterality: Right;  FOR ARTHRITIS   TOTAL HIP ARTHROPLASTY  FALL OF 2008   rt. partial hip replacement   TOTAL SHOULDER ARTHROPLASTY  06/05/2012   Procedure: TOTAL SHOULDER ARTHROPLASTY;  Surgeon: Johnny Bridge, MD;  Location: [place];  Service: Orthopedics;  Laterality: Right;  RIGHT SHOULDER TOTAL ARTHROPLASTY, HEMIARTHROPLASTY, SHOULDER, FOR ARTHRITIS   VIDEO BRONCHOSCOPY Bilateral 01/28/2015   Procedure: VIDEO BRONCHOSCOPY WITH FLUORO;  Surgeon: Collene Gobble, MD;  Location: [place];  Service: Cardiopulmonary;  Laterality: Bilateral;    VIDEO BRONCHOSCOPY Bilateral 04/23/2019   Procedure: VIDEO BRONCHOSCOPY WITHOUT FLUORO;  Surgeon: Collene Gobble, MD;  Location: [facility] ENDOSCOPY;  Service: Cardiopulmonary;  Laterality: Bilateral;     OB History    Gravida  1   Para  0   Term      Preterm      AB  1   Living  0     SAB  1   TAB      Ectopic      Multiple      Live Births              Family History  Problem Relation Age of Onset   Heart disease Mother        MI at age 51   Emphysema Mother    Thyroid disease Mother        Thyroidectomy/Benign   Lymphoma Maternal Grandmother    Colon cancer Paternal Grandmother    COPD Father    Cancer Brother        adenocarcinoma right lung   COPD Brother    Heart attack Paternal Grandfather    Pancreatic cancer Sister    Esophageal cancer Neg Hx    Rectal cancer Neg Hx    Stomach cancer Neg Hx     Social History   Tobacco Use   Smoking status: Former Smoker    Packs/day: 0.75    Years: 8.00    Pack years: 6.00    Types: Cigarettes    Quit date: 08/01/1976    Years since quitting: 43.5   Smokeless tobacco: Never Used   Tobacco comment: Quit in 1978  Vaping Use   Vaping Use: Never used  Substance Use Topics   Alcohol use: No    Alcohol/week: 0.0 standard drinks   Drug use: No    Home Medications Prior to Admission medications   Medication Sig Start Date End Date Taking? Authorizing Provider  amLODipine (NORVASC) 2.5 MG tablet Take 1 tablet (2.5 mg total) by mouth daily. 02/20/20   Dorie Rank, MD  aspirin 81 MG tablet Take 1 tablet (81 mg total) by mouth daily. 01/23/18   Frann Rider, NP  calcium-vitamin D 250-100 MG-UNIT tablet Take 1 tablet by mouth 2 (two) times daily.    [provider]  denosumab (PROLIA) 60 MG/ML SOSY injection Inject 60 mg into the skin every 6 (six) months.    [provider]  DULoxetine (CYMBALTA) 30 MG capsule Take 1 capsule (30 mg total) by mouth 2 (two) times daily. 07/25/19    [name], MD  famotidine (PEPCID) 20 MG tablet Take 1 tablet (20 mg total) by mouth 2 (two) times daily. 07/25/19   [name], MD  gabapentin (NEURONTIN) 300 MG capsule Take 1 capsule (300 mg total) by mouth 2 (two) times daily as needed (pain). TAKE 1 CAPSULE(300 MG) BY MOUTH TWICE  DAILY AS NEEDED 07/25/19   [name], MD  Polyethyl Glycol-Propyl Glycol (SYSTANE) 0.4-0.3 % GEL ophthalmic gel Place 1 application into both eyes 3 (three) times daily as needed (dry eye).    [provider]  rosuvastatin (CRESTOR) 20 MG tablet Take 1 tablet (20 mg total) by mouth daily. 07/25/19   [name], MD  zolpidem (AMBIEN) 5 MG tablet TAKE 1 TABLET BY MOUTH EVERY DAY AT BEDTIME FOR SLEEP 02/10/20   [name], MD    Allergies    Azithromycin, Celecoxib, and Sulfonamide derivatives  Review of Systems   Review of Systems  All other systems reviewed and are negative.   Physical Exam Updated Vital Signs BP (!) 197/92    Pulse 63    Temp 97.6 F (36.4 C) (Oral)    Resp 17    Ht 1.657 m (5' 5.25")    Wt 63.5 kg    LMP 08/25/1992    SpO2 99%    BMI 23.12 kg/m   Physical Exam Vitals and nursing note reviewed.  Constitutional:      General: She is not in acute distress.    Appearance: She is well-developed.  HENT:     Head: Normocephalic and atraumatic.     Right Ear: External ear normal.     Left Ear: External ear normal.  Eyes:     General: No scleral icterus.       Right eye: No discharge.        Left eye: No discharge.     Conjunctiva/sclera: Conjunctivae normal.  Neck:     Trachea: No tracheal deviation.  Cardiovascular:     Rate and Rhythm: Normal rate and regular rhythm.  Pulmonary:     Effort: Pulmonary effort is normal. No respiratory distress.     Breath sounds: Normal breath sounds. No stridor. No wheezing or rales.  Abdominal:     General: Bowel sounds are normal. There is no distension.     Palpations: Abdomen is soft.      Tenderness: There is no abdominal tenderness. There is no guarding or rebound.  Musculoskeletal:        General: No tenderness.     Cervical back: Neck supple.  Skin:    General: Skin is warm and dry.     Findings: No rash.  Neurological:     Mental Status: She is alert.     Cranial Nerves: No cranial nerve deficit (no facial droop, extraocular movements intact, no slurred speech).     Sensory: No sensory deficit.     Motor: No abnormal muscle tone or seizure activity.     Coordination: Coordination normal.     ED Results / Procedures / Treatments   Labs (all labs ordered are listed, but only abnormal results are displayed) Labs Reviewed  BASIC METABOLIC PANEL - Abnormal; Notable for the following components:      Result Value   Creatinine, Ser 1.58 (*)    GFR calc non Af Amer 31 (*)    GFR calc Af Amer 36 (*)    All other components within normal limits  CBC  TROPONIN I (HIGH SENSITIVITY)  TROPONIN I (HIGH SENSITIVITY)    EKG EKG Interpretation  Date/Time:  Thursday February 20 2020 17:14:37 EDT Ventricular Rate:  62 PR Interval:    QRS Duration: 91 QT Interval:  412 QTC Calculation: 419 R Axis:   -23 Text Interpretation: Sinus rhythm Borderline left axis deviation Anterior infarct, old No significant  change since last tracing Confirmed by Dorie Rank [id number]) on 02/20/2020 7:05:32 PM   Radiology DG Chest 2 View  Result Date: 02/20/2020 CLINICAL DATA:  Hypertension. EXAM: CHEST - 2 VIEW COMPARISON:  CT chest 03/19/2019. FINDINGS: Mediastinum and hilar structures normal. Heart size normal. No evidence of infiltrate noted on today's exam. No pleural effusion or pneumothorax. No acute bony abnormality. Prior right shoulder replacement. IMPRESSION: No acute cardiopulmonary disease. Electronically Signed   By: Marcello Moores  Register   On: 02/20/2020 12:45    Procedures Procedures (including critical care time)  Medications Ordered in ED Medications  sodium chloride flush (NS)  0.9 % injection 3 mL (has no administration in time range)  amLODipine (NORVASC) tablet 2.5 mg (2.5 mg Oral Given 02/20/20 1710)    ED Course  I have reviewed the triage vital signs and the nursing notes.  Pertinent labs & imaging results that were available during my care of the patient were reviewed by me and considered in my medical decision making (see chart for details).    MDM Rules/Calculators/A&P                          Patient presents to the ED for evaluation of hypertension.  Patient is on a very low dose of blood pressure medications.  She is only taking 1.25 mg of Norvasc daily.  Patient is not having any symptoms.  She does not have any chest pain or shortness of breath.  Serial troponins are normal.  EKG is reassuring.  Patient was given an additional dose of Norvasc.  Recommend she increase her dose to 2.5 mg daily and have her follow-up closely with her primary care doctor. Final Clinical Impression(s) / ED Diagnoses Final diagnoses:  Hypertension, unspecified type    Rx / DC Orders ED Discharge Orders         Ordered    amLODipine (NORVASC) 2.5 MG tablet  Daily     Discontinue  Reprint     02/20/20 1918           Dorie Rank, MD 02/20/20 1919

## 2020-02-21 ENCOUNTER — Telehealth: Payer: Self-pay

## 2020-02-21 NOTE — Telephone Encounter (Signed)
Pt went to ED yesterday for hypertension and they upped her blood pressure medication. She would like to talk that over with Dr. Yong Channel to see if she should keep taking it as the ED Dr. Prescribed.

## 2020-02-21 NOTE — Telephone Encounter (Signed)
The only openings Dr. Yong Channel has available are same days until sept.23. Should I use a same day appt?

## 2020-02-21 NOTE — Telephone Encounter (Signed)
Please schedule a ED f/u visit for this to be discussed.

## 2020-02-24 NOTE — Progress Notes (Signed)
Phone [phone] In person visit   Subjective:   Sandra Craig is a 79 y.o. year old very pleasant female patient who presents for/with high blood pressure. Was sent to ER from dentist office on 7/22 for BP reading of 190/116. See problem oriented charting Chief Complaint  Patient presents with  . ER f/u    190/116 at dentist on 7/22, was sent to ER    This visit occurred during the SARS-CoV-2 public health emergency.  Safety protocols were in place, including screening questions prior to the visit, additional usage of staff PPE, and extensive cleaning of exam room while observing appropriate contact time as indicated for disinfecting solutions.   Past Medical History-  Patient Active Problem List   Diagnosis Date Noted  . History of transient ischemic attack (TIA) 06/02/2017    Priority: High  . 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 Chronic low back pain 12/20/2013    Priority: High  . Fibromyalgia 12/05/2007    Priority: High  . UTI (urinary tract infection) 12/15/2018    Priority: Medium  . CKD (chronic kidney disease), stage III 06/02/2017    Priority: Medium  . Near syncope 03/11/2017    Priority: Medium  . History of small bowel obstruction 06/28/2011    Priority: Medium  . GERD with stricture 06/28/2011    Priority: Medium  . Polycystic kidney disease     Priority: Medium  . Hypertension     Priority: Medium  . Hyperlipidemia     Priority: Medium  . Osteoporosis 12/21/2009    Priority: Medium  . INSOMNIA, CHRONIC 10/08/2008    Priority: Medium  . Depression 02/08/2008    Priority: Medium  . Chronic right shoulder pain 03/07/2014    Priority: Low  . Benign essential tremor 10/09/2013    Priority: Low  . Hyperglycemia 06/27/2012    Priority: Low  . Diverticulitis large intestine 02/11/2012    Priority: Low  . Irritable bowel syndrome (IBS) 06/28/2011    Priority: Low  . IC (interstitial cystitis)       Priority: Low  . Hemorrhoids 10/08/2008    Priority: Low  . 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 HEMATURIA UNSPECIFIED 07/04/2008    Priority: Low  . Lung nodules 08/09/2007    Priority: Low  . Facet arthropathy 09/14/2019  . Vaginal dryness 01/29/2019  . Major depressive disorder with single episode, in full remission ([place]) 01/29/2019    Medications- reviewed and updated Current Outpatient Medications  Medication Sig Dispense Refill  . amLODipine (NORVASC) 2.5 MG tablet Take 0.5 tablets (1.25 mg total) by mouth in the morning and at bedtime. Always take 1/2 tablet before bed. Can take 1/2 tablet in AM if blood pressure >165/100 90 tablet 3  . aspirin 81 MG tablet Take 1 tablet (81 mg total) by mouth daily. 30 tablet   . calcium-vitamin D 250-100 MG-UNIT tablet Take 1 tablet by mouth 2 (two) times daily.    Marland Kitchen denosumab (PROLIA) 60 MG/ML SOSY injection Inject 60 mg into the skin every 6 (six) months.    . DULoxetine (CYMBALTA) 30 MG capsule Take 1 capsule (30 mg total) by mouth 2 (two) times daily. 90 capsule 3  . famotidine (PEPCID) 20 MG tablet Take 1 tablet (20 mg total) by mouth 2 (two) times daily. 180 tablet 3  . gabapentin (NEURONTIN) 300 MG capsule Take 1 capsule (300 mg total) by mouth  2 (two) times daily as needed (pain). TAKE 1 CAPSULE(300 MG) BY MOUTH TWICE DAILY AS NEEDED 180 capsule 3  . Multiple Vitamins-Minerals (MULTIVITAMIN WITH MINERALS) tablet Take 1 tablet by mouth daily.    Vladimir Faster Glycol-Propyl Glycol (SYSTANE) 0.4-0.3 % GEL ophthalmic gel Place 1 application into both eyes 3 (three) times daily as needed (dry eye).    . rosuvastatin (CRESTOR) 20 MG tablet Take 1 tablet (20 mg total) by mouth daily. 90 tablet 3  . zolpidem (AMBIEN) 5 MG tablet TAKE 1 TABLET BY MOUTH EVERY DAY AT BEDTIME FOR SLEEP (Patient taking differently: Take 5 mg by mouth at bedtime. ) 90 tablet 0   No current facility-administered medications for  this visit.   Facility-Administered Medications Ordered in Other Visits  Medication Dose Route Frequency Provider Last Rate Last Admin  . Chlorhexidine Gluconate Cloth 2 % PADS 6 each  6 each Topical Once Ralene Ok, MD       And  . Chlorhexidine Gluconate Cloth 2 % PADS 6 each  6 each Topical Once Ralene Ok, MD         Objective:  BP 118/84   Pulse 77   Temp 98.5 F (36.9 C) (Temporal)   Wt 142 lb 6.4 oz (64.6 kg)   LMP 08/25/1992   SpO2 94%   BMI 23.52 kg/m  Gen: NAD, resting comfortably CV: RRR no murmurs rubs or gallops Lungs: CTAB no crackles, wheeze, rhonchi Ext: no edema Skin: warm, dry     Assessment and Plan  #ED F/U forhypertension S: medication: amlodipine 1.25mg  daily at baseline.   asymptomatic upon arrival to ER on 02/20/20 sent from dentist with bp 190/116. troponins and EKG reassuring. Was advised increase to 2.5mg  daily  Home readings #s: average over las 14 readings 144.6/78.7 with variability from 117/64 up to 178/88.  BP Readings from Last 3 Encounters:  02/26/20 (!) 155/85  02/26/20 118/84  02/20/20 (!) 169/75  A/P: Mild poor control.  79 year old female with history of TIA but also presyncope/near syncope and even a syncopal event in [place] 5 years ago when blood pressure got too low.  Home blood pressure averaging approximately 145/79 but does have some numbers as high as 178/88-we opted to continue amlodipine 1.25 mg before bed but also add an additional 1.25 mg if morning blood pressure is greater than 165/100   #hyperlipidemia/history of TIA S: Medication: Rosuvastatin 20 mg daily Lab Results  Component Value Date   CHOL 149 10/25/2019   HDL 62.00 10/25/2019   LDLCALC 70 10/25/2019   LDLDIRECT 67.0 01/29/2019   TRIG 84.0 10/25/2019   CHOLHDL 2 10/25/2019   A/P: Reasonable control with LDL at 70 in March on rosuvastatin 20 mg.  Also on aspirin for primary prevention-continue both medications   #Social update-reports sister is in  good spirits despite failing chemotherapy attempts for reversing pancreatic cancer.  Patient finds strength and sisters positive perspective.   Recommended follow up: Keep October 15 visit to reevaluate blood pressure-happy to see her sooner if needed Future Appointments  Date Time Provider [place]  05/15/2020 11:20 AM [name], MD [facility]  07/21/2020  2:40 PM Yong Channel [name], MD [facility]    Lab/Order associations:   ICD-10-CM   1. Essential hypertension  I10   2. Hyperlipidemia, unspecified hyperlipidemia type  E78.5   3. History of transient ischemic attack (TIA)  Z86.73     Meds ordered this encounter  Medications  . amLODipine (NORVASC) 2.5 MG  tablet    Sig: Take 0.5 tablets (1.25 mg total) by mouth in the morning and at bedtime. Always take 1/2 tablet before bed. Can take 1/2 tablet in AM if blood pressure >165/100    Dispense:  90 tablet    Refill:  3   Return precautions advised.  Garret Reddish, MD

## 2020-02-24 NOTE — Patient Instructions (Addendum)
Take blood pressure in the mornings, If blood pressure is >165 on top #  or >100 on bottom # then take an additional half tablet of amlodipine 2.5mg . I need to know immediately if any passing out or even feelings like you might as we would need to step this back down.   Continue amlodipine 1.25mg  before bed.

## 2020-02-24 NOTE — Telephone Encounter (Signed)
Yes, this is fine.

## 2020-02-25 ENCOUNTER — Inpatient Hospital Stay (HOSPITAL_COMMUNITY): Admission: RE | Admit: 2020-02-25 | Payer: Medicare Other | Source: Ambulatory Visit

## 2020-02-25 DIAGNOSIS — M545 Low back pain: Secondary | ICD-10-CM | POA: Diagnosis not present

## 2020-02-25 DIAGNOSIS — M47816 Spondylosis without myelopathy or radiculopathy, lumbar region: Secondary | ICD-10-CM | POA: Diagnosis not present

## 2020-02-25 DIAGNOSIS — M4316 Spondylolisthesis, lumbar region: Secondary | ICD-10-CM | POA: Diagnosis not present

## 2020-02-25 NOTE — Progress Notes (Signed)
[facility] - [place], [place] - [address] [place] [address] Phone: [phone] Fax: [place], [place] - [place] [address] [place] [address] Phone: [phone] Fax: [facility] [address], [place] [address] Phone: [phone] Fax: [phone]      Your procedure is scheduled on Friday July 30  Report to [facility] Main Entrance "A" at 0700 A.M., and check in at the Admitting office.  Call this number if you have problems the morning of surgery:  [phone]  Call [phone] if you have any questions prior to your surgery date Monday-Friday 8am-4pm    Remember:  Do not eat after midnight the night before your surgery  You may drink clear liquids until 0600 am the morning of your surgery.   Clear liquids allowed are: Water, Non-Citrus Juices (without pulp), Carbonated Beverages, Clear Tea, Black Coffee Only, and Gatorade    Take these medicines the morning of surgery with A SIP OF WATER  amLODipine (NORVASC) DULoxetine (CYMBALTA famotidine (PEPCID) gabapentin (NEURONTIN) rosuvastatin (CRESTOR)   Follow your surgeon's instructions on when to stop Aspirin.  If no instructions were given by your surgeon then you will need to call the office to get those instructions.     As of today, STOP taking any Aspirin (unless otherwise instructed by your surgeon) Aleve, Naproxen, Ibuprofen, Motrin, Advil, Goody's, BC's, all herbal medications, fish oil, and all vitamins.                      Do not wear jewelry, make up, or nail polish            Do not wear lotions, powders, perfumes, or deodorant.            Do not shave 48 hours prior to surgery.              Do not bring valuables to the hospital.            [facility] is not  responsible for any belongings or valuables.  Do NOT Smoke (Tobacco/Vaping) or drink Alcohol 24 hours prior to your procedure If you use a CPAP at night, you may bring all equipment for your overnight stay.   Contacts, glasses, dentures or bridgework may not be worn into surgery.      For patients admitted to the hospital, discharge time will be determined by your treatment team.   Patients discharged the day of surgery will not be allowed to drive home, and someone needs to stay with them for 24 hours.    Special instructions:   [place]- Preparing For Surgery  Before surgery, you can play an important role. Because skin is not sterile, your skin needs to be as free of germs as possible. You can reduce the number of germs on your skin by washing with CHG (chlorahexidine gluconate) Soap before surgery.  CHG is an antiseptic cleaner which kills germs and bonds with the skin to continue killing germs even after washing.    Oral Hygiene is also important to reduce your risk of infection.  Remember - BRUSH YOUR TEETH THE MORNING OF SURGERY WITH YOUR REGULAR TOOTHPASTE  Please do not use if you have an allergy to CHG or antibacterial soaps. If your  skin becomes reddened/irritated stop using the CHG.  Do not shave (including legs and underarms) for at least 48 hours prior to first CHG shower. It is OK to shave your face.  Please follow these instructions carefully.   1. Shower the NIGHT BEFORE SURGERY and the MORNING OF SURGERY with CHG Soap.   2. If you chose to wash your hair, wash your hair first as usual with your normal shampoo.  3. After you shampoo, rinse your hair and body thoroughly to remove the shampoo.  4. Use CHG as you would any other liquid soap. You can apply CHG directly to the skin and wash gently with a scrungie or a clean washcloth.   5. Apply the CHG Soap to your body ONLY FROM THE NECK DOWN.  Do not use on open wounds or open sores. Avoid contact with your eyes,  ears, mouth and genitals (private parts). Wash Face and genitals (private parts)  with your normal soap.   6. Wash thoroughly, paying special attention to the area where your surgery will be performed.  7. Thoroughly rinse your body with warm water from the neck down.  8. DO NOT shower/wash with your normal soap after using and rinsing off the CHG Soap.  9. Pat yourself dry with a CLEAN TOWEL.  10. Wear CLEAN PAJAMAS to bed the night before surgery  11. Place CLEAN SHEETS on your bed the night of your first shower and DO NOT SLEEP WITH PETS.   Day of Surgery: Wear Clean/Comfortable clothing the morning of surgery Do not apply any deodorants/lotions.   Remember to brush your teeth WITH YOUR REGULAR TOOTHPASTE.   Please read over the following fact sheets that you were given.

## 2020-02-26 ENCOUNTER — Other Ambulatory Visit: Payer: Self-pay

## 2020-02-26 ENCOUNTER — Ambulatory Visit (INDEPENDENT_AMBULATORY_CARE_PROVIDER_SITE_OTHER): Payer: Medicare Other | Admitting: Family Medicine

## 2020-02-26 ENCOUNTER — Encounter: Payer: Self-pay | Admitting: Family Medicine

## 2020-02-26 ENCOUNTER — Encounter (HOSPITAL_COMMUNITY)
Admission: RE | Admit: 2020-02-26 | Discharge: 2020-02-26 | Disposition: A | Discharge status: Home / Self Care | Payer: Medicare Other | Source: Ambulatory Visit | Attending: General Surgery | Admitting: General Surgery

## 2020-02-26 ENCOUNTER — Encounter (HOSPITAL_COMMUNITY): Payer: Self-pay

## 2020-02-26 ENCOUNTER — Other Ambulatory Visit (HOSPITAL_COMMUNITY)
Admission: RE | Admit: 2020-02-26 | Discharge: 2020-02-26 | Disposition: A | Discharge status: Home / Self Care | Payer: Medicare Other | Source: Ambulatory Visit | Attending: General Surgery | Admitting: General Surgery

## 2020-02-26 VITALS — BP 118/84 | HR 77 | Temp 98.5°F | Wt 142.4 lb

## 2020-02-26 DIAGNOSIS — I10 Essential (primary) hypertension: Secondary | ICD-10-CM | POA: Diagnosis not present

## 2020-02-26 DIAGNOSIS — Z8673 Personal history of transient ischemic attack (TIA), and cerebral infarction without residual deficits: Secondary | ICD-10-CM

## 2020-02-26 DIAGNOSIS — Z20822 Contact with and (suspected) exposure to covid-19: Secondary | ICD-10-CM | POA: Diagnosis not present

## 2020-02-26 DIAGNOSIS — Z01812 Encounter for preprocedural laboratory examination: Secondary | ICD-10-CM | POA: Diagnosis not present

## 2020-02-26 DIAGNOSIS — E785 Hyperlipidemia, unspecified: Secondary | ICD-10-CM | POA: Diagnosis not present

## 2020-02-26 HISTORY — DX: Unspecified osteoarthritis, unspecified site: M19.90

## 2020-02-26 HISTORY — DX: Depression, unspecified: F32.A

## 2020-02-26 LAB — CBC
HCT: 40.8 % (ref 36.0–46.0)
Hemoglobin: 12.3 g/dL (ref 12.0–15.0)
MCH: 28.1 pg (ref 26.0–34.0)
MCHC: 30.1 g/dL (ref 30.0–36.0)
MCV: 93.4 fL (ref 80.0–100.0)
Platelets: 194 10*3/uL (ref 150–400)
RBC: 4.37 MIL/uL (ref 3.87–5.11)
RDW: 14.4 % (ref 11.5–15.5)
WBC: 6.6 10*3/uL (ref 4.0–10.5)
nRBC: 0 % (ref 0.0–0.2)

## 2020-02-26 LAB — SARS CORONAVIRUS 2 (TAT 6-24 HRS): SARS Coronavirus 2: NEGATIVE

## 2020-02-26 LAB — BASIC METABOLIC PANEL
Anion gap: 9 (ref 5–15)
BUN: 22 mg/dL (ref 8–23)
CO2: 28 mmol/L (ref 22–32)
Calcium: 9.6 mg/dL (ref 8.9–10.3)
Chloride: 105 mmol/L (ref 98–111)
Creatinine, Ser: 1.54 mg/dL — ABNORMAL HIGH (ref 0.44–1.00)
GFR calc Af Amer: 37 mL/min — ABNORMAL LOW (ref >60)
GFR calc non Af Amer: 32 mL/min — ABNORMAL LOW (ref >60)
Glucose, Bld: 98 mg/dL (ref 70–99)
Potassium: 4.4 mmol/L (ref 3.5–5.1)
Sodium: 142 mmol/L (ref 135–145)

## 2020-02-26 MED ORDER — AMLODIPINE BESYLATE 2.5 MG PO TABS: 1.2500 mg | ORAL_TABLET | Freq: Two times a day (BID) | ORAL | 3 refills | Status: DC

## 2020-02-26 MED ORDER — CHLORHEXIDINE GLUCONATE CLOTH 2 % EX PADS: 6.0000 | MEDICATED_PAD | Freq: Once | CUTANEOUS | Status: DC

## 2020-02-26 NOTE — Progress Notes (Signed)
PCP - DR Ansel Bong WITH [facility]   ---MEDICAL CLEARANCE Cardiologist - NA    Chest x-ray - 7/21   EKG - 7/21 Stress Test - NA ECHO - 11/18 Cardiac Cath - NA         Blood Thinner Instructions:NA Aspirin Instructions:STOP  ERAS Protcol -YES   PRE-SURGERY Ensure or G2- INSTRUCTIONS GIVEN  COVID TEST- DONE TODAY   Anesthesia review: HTN  HX    Patient denies shortness of breath, fever, cough and chest pain at PAT appointment   All instructions explained to the patient, with a verbal understanding of the material. Patient agrees to go over the instructions while at home for a better understanding. Patient also instructed to self quarantine after being tested for COVID-19. The opportunity to ask questions was provided.

## 2020-02-27 NOTE — Anesthesia Preprocedure Evaluation (Addendum)
Anesthesia Evaluation  Patient identified by MRN, date of birth, ID band Patient awake    Reviewed: Allergy & Precautions, NPO status , Patient's Chart, lab work & pertinent test results  Airway Mallampati: II  TM Distance: >3 FB     Dental   Pulmonary former smoker,    breath sounds clear to auscultation       Cardiovascular hypertension,  Rhythm:Regular Rate:Normal     Neuro/Psych  Neuromuscular disease CVA    GI/Hepatic Neg liver ROS, hiatal hernia, GERD  ,  Endo/Other    Renal/GU Renal disease     Musculoskeletal  (+) Arthritis , Fibromyalgia -  Abdominal   Peds  Hematology   Anesthesia Other Findings   Reproductive/Obstetrics                            Anesthesia Physical Anesthesia Plan  ASA: III  Anesthesia Plan: General   Post-op Pain Management:    Induction: Intravenous  PONV Risk Score and Plan: 3 and Ondansetron, Dexamethasone and Midazolam  Airway Management Planned: Oral ETT  Additional Equipment:   Intra-op Plan:   Post-operative Plan: Extubation in OR  Informed Consent: I have reviewed the patients History and Physical, chart, labs and discussed the procedure including the risks, benefits and alternatives for the proposed anesthesia with the patient or authorized representative who has indicated his/her understanding and acceptance.     Dental advisory given  Plan Discussed with: CRNA and Anesthesiologist  Anesthesia Plan Comments: (PAT note by Karoline Caldwell, PA-C:  Patient has a history of somewhat labile hypertension as well as orthostatic hypotension which is made management a little more complicated.  Last seen by her PCP Dr. Yong Channel 02/26/2020 and discussed blood pressure control.  She had recently had elevated readings.  Per note, she will continue amlodipine 1.25 mg before bed but also add an additional 1.25 mg in the morning if her blood pressure is  greater than 165/100.  Somewhat permissive hypertension allowed due to history of orthostasis.  Dr. Yong Channel had previously cleared patient for hernia surgery note from 10/04/2019, " She is able to complete 4 METS of activity without chest pain or shortness of breath so from my perspective would not require further cardiac clearance."  History of TIA November 2018, transient word finding difficulty lasted about 8 minutes, resolved spontaneously, hospital work-up was benign.  Carotid Dopplers showed no evidence of stenosis, 30-day monitor showed no evidence of A. Fib.  History of mild renal insufficiency with baseline creatinine around 1.5.  Creatinine 1.54 on preop labs.  Remainder of labs unremarkable.  EKG 02/20/2020: Sinus rhythm.  Rate 62. Borderline left axis deviation. Anterior infarct, old. No significant change since last tracing  Event monitor 06/20/2017: Sinus rhythm No arrhythmias No atrial fibrillation No AV block or pauses   Carotid duplex 06/03/2017: Final Interpretation:  Right Carotid: There is no evicence of stenosis in the right ICA.   Left Carotid: There is no evicence of stenosis in the left ICA.  TTE 06/03/2017: - Left ventricle: The cavity size was normal. Wall thickness was  normal. Systolic function was normal. The estimated ejection  fraction was in the range of 50% to 55%. Wall motion was normal;  there were no regional wall motion abnormalities. Doppler  parameters are consistent with abnormal left ventricular  relaxation (grade 1 diastolic dysfunction).   Impressions:   - Low normal LV systolic function; mild diastolic dysfunction.  )  Anesthesia Quick Evaluation

## 2020-02-27 NOTE — Progress Notes (Signed)
Anesthesia Chart Review:  Patient has a history of somewhat labile hypertension as well as orthostatic hypotension which is made management a little more complicated.  Last seen by her PCP Dr. Yong Channel 02/26/2020 and discussed blood pressure control.  She had recently had elevated readings.  Per note, she will continue amlodipine 1.25 mg before bed but also add an additional 1.25 mg in the morning if her blood pressure is greater than 165/100.  Somewhat permissive hypertension allowed due to history of orthostasis.  Dr. Yong Channel had previously cleared patient for hernia surgery note from 10/04/2019, " She is able to complete 4 METS of activity without chest pain or shortness of breath so from my perspective would not require further cardiac clearance."  History of TIA November 2018, transient word finding difficulty lasted about 8 minutes, resolved spontaneously, hospital work-up was benign.  Carotid Dopplers showed no evidence of stenosis, 30-day monitor showed no evidence of A. Fib.  History of mild renal insufficiency with baseline creatinine around 1.5.  Creatinine 1.54 on preop labs.  Remainder of labs unremarkable.  EKG 02/20/2020: Sinus rhythm.  Rate 62. Borderline left axis deviation. Anterior infarct, old. No significant change since last tracing  Event monitor 06/20/2017: Sinus rhythm No arrhythmias No atrial fibrillation No AV block or pauses   Carotid duplex 06/03/2017: Final Interpretation:  Right Carotid: There is no evicence of stenosis in the right ICA.   Left Carotid: There is no evicence of stenosis in the left ICA.  TTE 06/03/2017: - Left ventricle: The cavity size was normal. Wall thickness was  normal. Systolic function was normal. The estimated ejection  fraction was in the range of 50% to 55%. Wall motion was normal;  there were no regional wall motion abnormalities. Doppler  parameters are consistent with abnormal left ventricular  relaxation (grade 1 diastolic  dysfunction).   Impressions:   - Low normal LV systolic function; mild diastolic dysfunction.    Wynonia Musty [facility] Short Stay Center/Anesthesiology Phone [phone] 02/27/2020 9:13 AM

## 2020-02-28 ENCOUNTER — Encounter (HOSPITAL_COMMUNITY)
Admission: RE | Disposition: A | Discharge status: Home / Self Care | Payer: Self-pay | Source: Home / Self Care | Attending: General Surgery

## 2020-02-28 ENCOUNTER — Other Ambulatory Visit: Payer: Self-pay

## 2020-02-28 ENCOUNTER — Ambulatory Visit (HOSPITAL_COMMUNITY)
Admission: RE | Admit: 2020-02-28 | Discharge: 2020-02-28 | Disposition: A | Discharge status: Home / Self Care | Payer: Medicare Other | Attending: General Surgery | Admitting: General Surgery

## 2020-02-28 ENCOUNTER — Ambulatory Visit (HOSPITAL_COMMUNITY): Payer: Medicare Other | Admitting: Anesthesiology

## 2020-02-28 ENCOUNTER — Ambulatory Visit (HOSPITAL_COMMUNITY): Payer: Medicare Other | Admitting: Physician Assistant

## 2020-02-28 ENCOUNTER — Encounter (HOSPITAL_COMMUNITY): Payer: Self-pay | Admitting: General Surgery

## 2020-02-28 DIAGNOSIS — I129 Hypertensive chronic kidney disease with stage 1 through stage 4 chronic kidney disease, or unspecified chronic kidney disease: Secondary | ICD-10-CM | POA: Diagnosis not present

## 2020-02-28 DIAGNOSIS — Z7982 Long term (current) use of aspirin: Secondary | ICD-10-CM | POA: Diagnosis not present

## 2020-02-28 DIAGNOSIS — Z8673 Personal history of transient ischemic attack (TIA), and cerebral infarction without residual deficits: Secondary | ICD-10-CM | POA: Insufficient documentation

## 2020-02-28 DIAGNOSIS — K4090 Unilateral inguinal hernia, without obstruction or gangrene, not specified as recurrent: Secondary | ICD-10-CM | POA: Diagnosis not present

## 2020-02-28 DIAGNOSIS — Z79899 Other long term (current) drug therapy: Secondary | ICD-10-CM | POA: Diagnosis not present

## 2020-02-28 DIAGNOSIS — I10 Essential (primary) hypertension: Secondary | ICD-10-CM | POA: Insufficient documentation

## 2020-02-28 DIAGNOSIS — M797 Fibromyalgia: Secondary | ICD-10-CM | POA: Diagnosis not present

## 2020-02-28 DIAGNOSIS — Z87891 Personal history of nicotine dependence: Secondary | ICD-10-CM | POA: Insufficient documentation

## 2020-02-28 DIAGNOSIS — N183 Chronic kidney disease, stage 3 unspecified: Secondary | ICD-10-CM | POA: Diagnosis not present

## 2020-02-28 DIAGNOSIS — K649 Unspecified hemorrhoids: Secondary | ICD-10-CM | POA: Diagnosis not present

## 2020-02-28 HISTORY — PX: INGUINAL HERNIA REPAIR: SHX194

## 2020-02-28 SURGERY — REPAIR, HERNIA, INGUINAL, LAPAROSCOPIC
Anesthesia: General | Site: Abdomen | Laterality: Right

## 2020-02-28 MED ORDER — ORAL CARE MOUTH RINSE: 15.0000 mL | Freq: Once | OROMUCOSAL | Status: AC

## 2020-02-28 MED ORDER — FENTANYL CITRATE (PF) 100 MCG/2ML IJ SOLN
25.0000 ug | INTRAMUSCULAR | Status: DC | PRN
  Administered 2020-02-28: 25 ug via INTRAVENOUS

## 2020-02-28 MED ORDER — LIDOCAINE 2% (20 MG/ML) 5 ML SYRINGE
INTRAMUSCULAR | Status: AC
  Filled 2020-02-28: qty 5

## 2020-02-28 MED ORDER — LACTATED RINGERS IV SOLN: INTRAVENOUS | Status: DC

## 2020-02-28 MED ORDER — DEXAMETHASONE SODIUM PHOSPHATE 10 MG/ML IJ SOLN
INTRAMUSCULAR | Status: DC | PRN
  Administered 2020-02-28: 5 mg via INTRAVENOUS

## 2020-02-28 MED ORDER — PROPOFOL 10 MG/ML IV BOLUS
INTRAVENOUS | Status: AC
  Filled 2020-02-28: qty 20

## 2020-02-28 MED ORDER — 0.9 % SODIUM CHLORIDE (POUR BTL) OPTIME
TOPICAL | Status: DC | PRN
  Administered 2020-02-28: 1000 mL

## 2020-02-28 MED ORDER — DEXAMETHASONE SODIUM PHOSPHATE 10 MG/ML IJ SOLN: 4.0000 mg | INTRAMUSCULAR | Status: DC

## 2020-02-28 MED ORDER — PHENYLEPHRINE 40 MCG/ML (10ML) SYRINGE FOR IV PUSH (FOR BLOOD PRESSURE SUPPORT)
PREFILLED_SYRINGE | INTRAVENOUS | Status: DC | PRN
  Administered 2020-02-28: 120 ug via INTRAVENOUS

## 2020-02-28 MED ORDER — CHLORHEXIDINE GLUCONATE 0.12 % MT SOLN
15.0000 mL | Freq: Once | OROMUCOSAL | Status: AC
  Administered 2020-02-28: 15 mL via OROMUCOSAL
  Filled 2020-02-28: qty 15

## 2020-02-28 MED ORDER — GABAPENTIN 100 MG PO CAPS
200.0000 mg | ORAL_CAPSULE | ORAL | Status: DC
  Filled 2020-02-28: qty 2

## 2020-02-28 MED ORDER — ENSURE PRE-SURGERY PO LIQD: 296.0000 mL | Freq: Once | ORAL | Status: DC

## 2020-02-28 MED ORDER — AMISULPRIDE (ANTIEMETIC) 5 MG/2ML IV SOLN
5.0000 mg | Freq: Once | INTRAVENOUS | Status: AC
  Administered 2020-02-28: 5 mg via INTRAVENOUS

## 2020-02-28 MED ORDER — SUGAMMADEX SODIUM 200 MG/2ML IV SOLN
INTRAVENOUS | Status: DC | PRN
  Administered 2020-02-28: 200 mg via INTRAVENOUS

## 2020-02-28 MED ORDER — CEFAZOLIN SODIUM-DEXTROSE 2-4 GM/100ML-% IV SOLN
2.0000 g | INTRAVENOUS | Status: DC
  Filled 2020-02-28: qty 100

## 2020-02-28 MED ORDER — PROPOFOL 10 MG/ML IV BOLUS
INTRAVENOUS | Status: DC | PRN
  Administered 2020-02-28: 140 mg via INTRAVENOUS

## 2020-02-28 MED ORDER — ONDANSETRON HCL 4 MG/2ML IJ SOLN
INTRAMUSCULAR | Status: DC | PRN
  Administered 2020-02-28: 4 mg via INTRAVENOUS

## 2020-02-28 MED ORDER — DEXAMETHASONE SODIUM PHOSPHATE 10 MG/ML IJ SOLN
INTRAMUSCULAR | Status: AC
  Filled 2020-02-28: qty 1

## 2020-02-28 MED ORDER — STERILE WATER FOR IRRIGATION IR SOLN
Status: DC | PRN
  Administered 2020-02-28: 1000 mL

## 2020-02-28 MED ORDER — ONDANSETRON HCL 4 MG/2ML IJ SOLN
INTRAMUSCULAR | Status: AC
  Filled 2020-02-28: qty 2

## 2020-02-28 MED ORDER — AMISULPRIDE (ANTIEMETIC) 5 MG/2ML IV SOLN
INTRAVENOUS | Status: AC
  Filled 2020-02-28: qty 2

## 2020-02-28 MED ORDER — ROCURONIUM BROMIDE 10 MG/ML (PF) SYRINGE
PREFILLED_SYRINGE | INTRAVENOUS | Status: AC
  Filled 2020-02-28: qty 10

## 2020-02-28 MED ORDER — ACETAMINOPHEN 500 MG PO TABS
1000.0000 mg | ORAL_TABLET | ORAL | Status: AC
  Administered 2020-02-28: 1000 mg via ORAL
  Filled 2020-02-28: qty 2

## 2020-02-28 MED ORDER — TRAMADOL HCL 50 MG PO TABS: 50.0000 mg | ORAL_TABLET | Freq: Four times a day (QID) | ORAL | 0 refills | Status: AC | PRN

## 2020-02-28 MED ORDER — PHENYLEPHRINE HCL-NACL 10-0.9 MG/250ML-% IV SOLN
INTRAVENOUS | Status: DC | PRN
  Administered 2020-02-28: 25 ug/min via INTRAVENOUS

## 2020-02-28 MED ORDER — FENTANYL CITRATE (PF) 250 MCG/5ML IJ SOLN
INTRAMUSCULAR | Status: DC | PRN
  Administered 2020-02-28 (×3): 50 ug via INTRAVENOUS

## 2020-02-28 MED ORDER — BUPIVACAINE HCL 0.25 % IJ SOLN
INTRAMUSCULAR | Status: DC | PRN
  Administered 2020-02-28: 9 mL

## 2020-02-28 MED ORDER — FENTANYL CITRATE (PF) 250 MCG/5ML IJ SOLN
INTRAMUSCULAR | Status: AC
  Filled 2020-02-28: qty 5

## 2020-02-28 MED ORDER — ROCURONIUM BROMIDE 10 MG/ML (PF) SYRINGE
PREFILLED_SYRINGE | INTRAVENOUS | Status: DC | PRN
  Administered 2020-02-28: 50 mg via INTRAVENOUS

## 2020-02-28 MED ORDER — LIDOCAINE 2% (20 MG/ML) 5 ML SYRINGE
INTRAMUSCULAR | Status: DC | PRN
  Administered 2020-02-28: 60 mg via INTRAVENOUS

## 2020-02-28 MED ORDER — LACTATED RINGERS IV SOLN: INTRAVENOUS | Status: DC | PRN

## 2020-02-28 MED ORDER — FENTANYL CITRATE (PF) 100 MCG/2ML IJ SOLN
INTRAMUSCULAR | Status: AC
  Filled 2020-02-28: qty 2

## 2020-02-28 SURGICAL SUPPLY — 30 items
COVER SURGICAL LIGHT HANDLE (MISCELLANEOUS) ×2 IMPLANT
DERMABOND ADVANCED (GAUZE/BANDAGES/DRESSINGS) ×1
DERMABOND ADVANCED .7 DNX12 (GAUZE/BANDAGES/DRESSINGS) ×1 IMPLANT
ELECT REM PT RETURN 9FT ADLT (ELECTROSURGICAL) ×2
ELECTRODE REM PT RTRN 9FT ADLT (ELECTROSURGICAL) ×1 IMPLANT
GLOVE BIO SURGEON STRL SZ7.5 (GLOVE) ×2 IMPLANT
GOWN STRL REUS W/ TWL LRG LVL3 (GOWN DISPOSABLE) ×2 IMPLANT
GOWN STRL REUS W/ TWL XL LVL3 (GOWN DISPOSABLE) ×1 IMPLANT
GOWN STRL REUS W/TWL LRG LVL3 (GOWN DISPOSABLE) ×4
GOWN STRL REUS W/TWL XL LVL3 (GOWN DISPOSABLE) ×2
KIT BASIN OR (CUSTOM PROCEDURE TRAY) ×2 IMPLANT
KIT TURNOVER KIT B (KITS) ×2 IMPLANT
MESH 3DMAX 4X6 RT LRG (Mesh General) ×2 IMPLANT
NEEDLE INSUFFLATION 14GA 120MM (NEEDLE) IMPLANT
NS IRRIG 1000ML POUR BTL (IV SOLUTION) ×2 IMPLANT
PAD ARMBOARD 7.5X6 YLW CONV (MISCELLANEOUS) ×4 IMPLANT
RELOAD STAPLE HERNIA 4.0 BLUE (INSTRUMENTS) ×2 IMPLANT
SCISSORS LAP 5X35 DISP (ENDOMECHANICALS) ×2 IMPLANT
SET TUBE SMOKE EVAC HIGH FLOW (TUBING) ×2 IMPLANT
STAPLER HERNIA 12 8.5 360D (INSTRUMENTS) ×2 IMPLANT
SUT MNCRL AB 4-0 PS2 18 (SUTURE) ×2 IMPLANT
SUT VIC AB 1 CT1 27 (SUTURE) ×2
SUT VIC AB 1 CT1 27XBRD ANBCTR (SUTURE) ×1 IMPLANT
SYRINGE TOOMEY DISP (SYRINGE) IMPLANT
TOWEL GREEN STERILE FF (TOWEL DISPOSABLE) ×2 IMPLANT
TRAY LAPAROSCOPIC MC (CUSTOM PROCEDURE TRAY) ×2 IMPLANT
TROCAR OPTICAL SHORT 5MM (TROCAR) ×2 IMPLANT
TROCAR OPTICAL SLV SHORT 5MM (TROCAR) ×2 IMPLANT
TROCAR XCEL 12X100 BLDLESS (ENDOMECHANICALS) ×2 IMPLANT
WATER STERILE IRR 1000ML POUR (IV SOLUTION) ×2 IMPLANT

## 2020-02-28 NOTE — Discharge Instructions (Signed)
[facility] _______[facility] °INGUINAL HERNIA REPAIR: POST OP INSTRUCTIONS ° °Always review your discharge instruction sheet given to you by the facility where your surgery was performed. °IF YOU HAVE DISABILITY OR FAMILY LEAVE FORMS, YOU MUST BRING THEM TO THE OFFICE FOR PROCESSING.   °DO NOT GIVE THEM TO YOUR DOCTOR. ° °1. A  prescription for pain medication may be given to you upon discharge.  Take your pain medication as prescribed, if needed.  If narcotic pain medicine is not needed, then you may take acetaminophen (Tylenol) or ibuprofen (Advil) as needed. °2. Take your usually prescribed medications unless otherwise directed. °If you need a refill on your pain medication, please contact your pharmacy.  They will contact our office to request authorization. Prescriptions will not be filled after 5 pm or on week-ends. °3. You should follow a light diet the first 24 hours after arrival home, such as soup and crackers, etc.  Be sure to include lots of fluids daily.  Resume your normal diet the day after surgery. °4.Most patients will experience some swelling and bruising around the umbilicus or in the groin and scrotum.  Ice packs and reclining will help.  Swelling and bruising can take several days to resolve.  °6. It is common to experience some constipation if taking pain medication after surgery.  Increasing fluid intake and taking a stool softener (such as Colace) will usually help or prevent this problem from occurring.  A mild laxative (Milk of Magnesia or Miralax) should be taken according to package directions if there are no bowel movements after 48 hours. °7. Unless discharge instructions indicate otherwise, you may remove your bandages 24-48 hours after surgery, and you may shower at that time.  You may have steri-strips (small skin tapes) in place directly over the incision.  These strips should be left on the skin for 7-10 days.  If your surgeon used skin glue on the incision, you may  shower in 24 hours.  The glue will flake off over the next 2-3 weeks.  Any sutures or staples will be removed at the office during your follow-up visit. °8. ACTIVITIES:  You may resume regular (light) daily activities beginning the next day--such as daily self-care, walking, climbing stairs--gradually increasing activities as tolerated.  You may have sexual intercourse when it is comfortable.  Refrain from any heavy lifting or straining until approved by your doctor. ° °a.You may drive when you are no longer taking prescription pain medication, you can comfortably wear a seatbelt, and you can safely maneuver your car and apply brakes. °b.RETURN TO WORK:   °_____________________________________________ ° °9.You should see your doctor in the office for a follow-up appointment approximately 2-3 weeks after your surgery.  Make sure that you call for this appointment within a day or two after you arrive home to insure a convenient appointment time. °10.OTHER INSTRUCTIONS: _________________________ °   _____________________________________ ° °WHEN TO CALL YOUR DOCTOR: °1. Fever over 101.0 °2. Inability to urinate °3. Nausea and/or vomiting °4. Extreme swelling or bruising °5. Continued bleeding from incision. °6. Increased pain, redness, or drainage from the incision ° °The clinic staff is available to answer your questions during regular business hours.  Please don’t hesitate to call and ask to speak to one of the nurses for clinical concerns.  If you have a medical emergency, go to the nearest emergency room or call 911.  A surgeon from [facility] is always on call at the hospital ° ° °[address], [address], [place], [place]  [address] ? ° [address] [address], [place], [place]   [address] °[phone] ? [phone] ? FAX [phone] °Web site: [email or web address] ° °

## 2020-02-28 NOTE — Anesthesia Postprocedure Evaluation (Signed)
Anesthesia Post Note  Patient: Sandra Craig  Procedure(s) Performed: LAPAROSCOPIC RIGHT INGUINAL HERNIA REPAIR WITH MESH (Right Abdomen)     Patient location during evaluation: PACU Anesthesia Type: General Level of consciousness: awake Pain management: pain level controlled Vital Signs Assessment: post-procedure vital signs reviewed and stable Respiratory status: spontaneous breathing Cardiovascular status: stable Postop Assessment: no apparent nausea or vomiting Anesthetic complications: no   No complications documented.  Last Vitals:  Vitals:   02/28/20 1015 02/28/20 1030  BP: (!) 146/67 (!) 139/72  Pulse: 78 75  Resp: 18 17  Temp:  36.4 C  SpO2: 96% 96%    Last Pain:  Vitals:   02/28/20 1030  PainSc: 4                  Debrah Granderson

## 2020-02-28 NOTE — Transfer of Care (Signed)
Immediate Anesthesia Transfer of Care Note  Patient: Sandra Craig  Procedure(s) Performed: LAPAROSCOPIC RIGHT INGUINAL HERNIA REPAIR WITH MESH (Right Abdomen)  Patient Location: PACU  Anesthesia Type:General  Level of Consciousness: awake, alert  and oriented  Airway & Oxygen Therapy: Patient Spontanous Breathing  Post-op Assessment: Report given to RN and Post -op Vital signs reviewed and stable  Post vital signs: Reviewed and stable  Last Vitals:  Vitals Value Taken Time  BP 150/74 02/28/20 0937  Temp 36.4 C 02/28/20 0937  Pulse 84 02/28/20 0937  Resp 12 02/28/20 0937  SpO2 99 % 02/28/20 0937    Last Pain:  Vitals:   02/28/20 0937  PainSc: 0-No pain         Complications: No complications documented.

## 2020-02-28 NOTE — Op Note (Signed)
02/28/2020  9:17 AM  PATIENT:  Reggie Pile  79 y.o. female  PRE-OPERATIVE DIAGNOSIS:  right inguinal hernia  POST-OPERATIVE DIAGNOSIS:  Right indirect inguinal hernia  PROCEDURE:  Procedure(s): LAPAROSCOPIC RIGHT INGUINAL HERNIA REPAIR WITH MESH (Right)  SURGEON:  Surgeon(s) and Role:    Ralene Ok, MD - Primary   ANESTHESIA:   local and general  EBL:  minimal   BLOOD ADMINISTERED:none  DRAINS: none   LOCAL MEDICATIONS USED:  BUPIVICAINE   SPECIMEN:  No Specimen  DISPOSITION OF SPECIMEN:  N/A  COUNTS:  YES  TOURNIQUET:  * No tourniquets in log *  DICTATION: .Dragon Dictation Counts: reported as correct x 2   Findings:  The patient had a moderate sized left indirect hernia and a moderated sized cord lipoma   Indications for procedure:  The patient is a 79 year old female with a left inguinal hernia for several months. Patient complained of symptomatology to the left inguinal area. The patient was taken back for elective inguinal hernia repair.   Details of the procedure: The patient was taken back to the operating room. The patient was placed in supine position with bilateral SCDs in place.  The patient was prepped and draped in the usual sterile fashion.  After appropriate anitbiotics were confirmed, a time-out was confirmed and all facts were verified.   0.25% Marcaine was used to infiltrate the umbilical area. A 11-blade was used to cut down the skin and blunt dissection was used to get the anterior fashion.  The anterior fascia was incised approximately 1 cm and the muscles were retracted laterally. Blunt dissection was then used to create a space in the preperitoneal area. At this time a 10 mm camera was then introduced into the space and advanced the pubic tubercle and a 12 mm trocar was placed over this and insufflation was started.  At this time and space was created from medial to laterally the preperitoneal space.  Cooper's ligament was initially  cleaned off.  The hernia sac was identified in the indirect space. Dissection of the hernia sac and round ligament was undertaken.  The round ligament was cauterized and transected.   Once the hernia sac was taken down to approximately the umbilicus a Left Bard 3D Max mesh, size: Large, was  introduced into the preperitoneal space.  The mesh was brought over to cover the direct and indirect hernia spaces.  This was anchored into place and secured to Cooper's ligament with 4.64mm staples from a Coviden hernia stapler. It was anchored to the anterior abdominal wall with 4.8 mm staples. The hernia sac was seen lying posterior to the mesh. There was no staples placed laterally. The insufflation was evacuated and the peritoneum was seen posterior to the mesh. The trochars were removed. The anterior fascia was reapproximated using #1 Vicryl on a UR- 6.  Intra-abdominal air was evacuated and the Veress needle removed. The skin was reapproximated using 4-0 Monocryl subcuticular fashion and Dermabond. The patient was awakened from general anesthesia and taken to recovery in stable condition.   PLAN OF CARE: Discharge to home after PACU  PATIENT DISPOSITION:  PACU - hemodynamically stable.   Delay start of Pharmacological VTE agent (>24hrs) due to surgical blood loss or risk of bleeding: not applicable

## 2020-02-28 NOTE — Anesthesia Procedure Notes (Signed)
Procedure Name: Intubation Date/Time: 02/28/2020 8:26 AM Performed by: Kyung Rudd, CRNA Pre-anesthesia Checklist: Patient identified, Emergency Drugs available, Suction available and Patient being monitored Patient Re-evaluated:Patient Re-evaluated prior to induction Oxygen Delivery Method: Circle system utilized Preoxygenation: Pre-oxygenation with 100% oxygen Induction Type: IV induction Ventilation: Mask ventilation without difficulty Laryngoscope Size: Mac and 3 Grade View: Grade I Tube type: Oral Tube size: 7.0 mm Number of attempts: 1 Airway Equipment and Method: Stylet Placement Confirmation: ETT inserted through vocal cords under direct vision,  positive ETCO2 and breath sounds checked- equal and bilateral Secured at: 21 cm Tube secured with: Tape Dental Injury: Teeth and Oropharynx as per pre-operative assessment

## 2020-02-28 NOTE — H&P (Signed)
History of Present Illness ( The patient is a 79 year old female who presents with an inguinal hernia. Patient is a 79 year old female who comes in for evaluation of a right inguinal hernia. Patient states that she has noticed a small bulge the right inguinal area. She states that it's More prominent recently. Patient did have CT scan of the beginning year which showed a fat-containing hernia as well as a left-sided fat-containing hernia.  Patient states that she is having nerve ablation at [facility] clinic for back pain. She states that she has a history of a TIA however currently is only on aspirin.  She's had no signs or symptoms of incarceration or strangulation.  Patient's a previous ex-lap by Dr. Rise Patience for SBO.   -------------------------  She is referred by Dr. Delilah Shan for evaluation of a right inguinal hernia. She states that she is just had a lot going on over the past few months. She states that about one half months ago she saw a bulge in her right groin which prompted her to get it evaluated. She underwent a CT on February 4 which showed a fat-containing right inguinal hernia. She also had some renal cyst. She states she does not have any burning, shooting or stabbing pain in the groin. It occasionally aches. It is more noticeable toward the end of the day. It is not noticeable first thing in the morning. She recently developed rash on her right neck consistent with likely herpes zoster flare. It is resolving. She states that she had a TIA last November prompting hospitalization. She denies any current TIA or amaurosis fugax symptoms. She is on a baby aspirin. She has had a small bowel obstruction that required laparotomy by Dr. Rise Patience. She also had a case of diverticulitis 3 years ago. 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  I personally reviewed the CT scan from February 4. Also reviewed the referring  provider's note along with a daily medicine note from March 2.  I reviewed an upper endoscopy from February 11 which showed a 1 cm hiatal hernia. Some reddened mucosa in the antrum, vascular markings in the posterior pharynx. Also reviewed the colonoscopy from same day which showed some polyps in the cecum, transverse colon and sigmoid colon all of which were removed. She also some diverticulosis.  She states that she was admitted for her TIA but I can't find any records evident Epic or [employer] everywhere.   Allergies  Azithromycin *MACROLIDES*  Rash. Celecoxib *ANALGESICS - ANTI-INFLAMMATORY*  Nausea. Sulfonamide Derivatives  Rash. Allergies Reconciled   Medication History amLODIPine Besylate (2.5MG Tablet, Oral) Active. Aspirin (81MG Tablet, Oral) Active. Calcium-Vitamin D (250MG Tablet, Oral) Active. Prolia (60MG/ML Soln Pref Syr, Subcutaneous) Active. DULoxetine HCl (30MG Capsule DR Part, Oral) Active. Famotidine (20MG Tablet, Oral) Active. Gabapentin (300MG Capsule, Oral) Active. Systane (0.4-0.3% Solution, Ophthalmic) Active. Zolpidem Tartrate (5MG Tablet, Oral) Active. Rosuvastatin Calcium (20MG Tablet, Oral) Active. Medications Reconciled    Review of Systems  General Not Present- Appetite Loss, Chills, Fatigue, Fever, Night Sweats, Weight Gain and Weight Loss. Skin Present- New Lesions. Not Present- Change in Wart/Mole, Dryness, Hives, Jaundice, Non-Healing Wounds, Rash and Ulcer. HEENT Present- Hearing Loss and Wears glasses/contact lenses. Not Present- Earache, Hoarseness, Nose Bleed, Oral Ulcers, Ringing in the Ears, Seasonal Allergies, Sinus Pain, Sore Throat, Visual Disturbances and Yellow Eyes. Breast Not Present- Breast Mass, Breast Pain, Nipple Discharge and Skin Changes. Cardiovascular Present- Palpitations. Not Present- Chest Pain, Difficulty Breathing  Lying Down, Leg Cramps, Rapid Heart Rate, Shortness of Breath and Swelling of  Extremities. Gastrointestinal Present- Constipation, Hemorrhoids and Indigestion. Not Present- Abdominal Pain, Bloating, Bloody Stool, Change in Bowel Habits, Chronic diarrhea, Difficulty Swallowing, Excessive gas, Gets full quickly at meals, Nausea, Rectal Pain and Vomiting. Musculoskeletal Present- Back Pain, Joint Pain and Joint Stiffness. Not Present- Muscle Pain, Muscle Weakness and Swelling of Extremities. Neurological Present- Trouble walking. Not Present- Decreased Memory, Fainting, Headaches, Numbness, Seizures, Tingling, Tremor and Weakness. Psychiatric Not Present- Anxiety, Bipolar, Change in Sleep Pattern, Depression, Fearful and Frequent crying. BP (!) 158/84   Pulse 73   Temp 97.7 F (36.5 C)   Resp 17   Ht 5' 5.5" (1.664 m)   Wt 64.5 kg   LMP 08/25/1992   SpO2 98%   BMI 23.30 kg/m    Physical Exam The physical exam findings are as follows: Note: Constitutional: No acute distress, conversant, appears stated age  Eyes: Anicteric sclerae, moist conjunctiva, no lid lag  Neck: No thyromegaly, trachea midline, no cervical lymphadenopathy  Lungs: Clear to auscultation biilaterally, normal respiratory effot  Cardiovascular: regular rate & rhythm, no murmurs, no peripheal edema, pedal pulses 2+  GI: Soft, no masses or hepatosplenomegaly, non-tender to palpation right-sided palpable inguinal hernia, reducible  MSK: Normal gait, no clubbing cyanosis, edema  Skin: No rashes, palpation reveals normal skin turgor  Psychiatric: Appropriate judgment and insight, oriented to person, place, and time  Chaperone present-=diane    Assessment & Plan RIGHT INGUINAL HERNIA (K40.90) Impression: Patient is a 79 year old female with a right inguinal hernia. 1. The patient will like to proceed to the operating room for laparoscopic versus open right inguinal hernia repair with mesh.  2. I discussed with the patient the signs and symptoms of incarceration and  strangulation and the need to proceed to the ER should they occur.  3. I discussed with the patient the risks and benefits of the procedure to include but not limited to: Infection, bleeding, damage to surrounding structures, possible need for further surgery, possible nerve pain, and possible recurrence. The patient was understanding and wishes to proceed. Current Plans Pt Education - Pamphlet Given - Hernia Surgery: discussed with patient and provided information. You are being scheduled for surgery- Our schedulers will call you.  You should hear from our office's scheduling department within 5 working days about the location, date, and time of surgery. We try to make accommodations for patient's preferences in scheduling surgery, but sometimes the OR schedule or the surgeon's schedule prevents [place] from making those accommodations.  If you have not heard from our office [phone]) in 5 working days, call the office and ask for your surgeon's nurse.  If you have other questions about your diagnosis, plan, or surgery, call the office and ask for your surgeon's nurse.  HISTORY OF TIA (TRANSIENT ISCHEMIC ATTACK) (Z86.73) HYPERTENSION, ESSENTIAL (I10)

## 2020-03-02 ENCOUNTER — Encounter (HOSPITAL_COMMUNITY): Payer: Self-pay | Admitting: General Surgery

## 2020-03-19 ENCOUNTER — Other Ambulatory Visit: Payer: Self-pay | Admitting: Family Medicine

## 2020-03-20 DIAGNOSIS — Z1231 Encounter for screening mammogram for malignant neoplasm of breast: Secondary | ICD-10-CM | POA: Diagnosis not present

## 2020-03-20 LAB — HM MAMMOGRAPHY

## 2020-03-20 NOTE — Telephone Encounter (Signed)
PROLIA GIVEN 11/12/2018 NEXT INJECTION 05/14/2020

## 2020-03-24 ENCOUNTER — Telehealth: Payer: Self-pay | Admitting: *Deleted

## 2020-03-25 ENCOUNTER — Encounter: Payer: Self-pay | Admitting: Family Medicine

## 2020-04-07 NOTE — Telephone Encounter (Addendum)
Deductible N/A  OOP MAX $4200 ($2121.97MET)  Annual exam 05/13/2019 [place]  Calcium 9.6             Date 02/26/2020  Upcoming dental procedures   Prior Authorization needed NO PER REFERENCE #[id number]  Pt estimated Cost $222   APPT 05/14/2020    Coverage Details: 20% ONE DOSE, 20 %^ADMIN FEE

## 2020-04-09 ENCOUNTER — Telehealth: Payer: Self-pay | Admitting: Family Medicine

## 2020-04-09 NOTE — Telephone Encounter (Signed)
Patient is requesting a call back when dr.hunter gives an answer

## 2020-04-09 NOTE — Telephone Encounter (Signed)
Patient called in this afternoon to advise Dr.Hunter that she has been exposed, her niece who has been exposed to someone who has tested positive, and the niece does not go for a covid test until next Tuesday. Has no symptoms, but wanting to know if she should be tested.

## 2020-04-09 NOTE — Telephone Encounter (Signed)
Patient went and got a quick covid test that come back positive, wanted to know what to do.

## 2020-04-10 DIAGNOSIS — Z20822 Contact with and (suspected) exposure to covid-19: Secondary | ICD-10-CM | POA: Diagnosis not present

## 2020-04-11 NOTE — Telephone Encounter (Signed)
Left voicemail for patient   Patient with testing confirming covid 19  Therefore: - recommended patient watch closely for shortness of breath or confusion or worsening symptoms and if those occur she should contact us immediately or seek care in the emergency department -recommended patient consider purchasing pulse oximeter and if levels 94% or below persistently- seek care at the hospital - for quarantine if covid 19 needs to be at least 10 days since first symptom AND at least 24 hours fever free without fever reducing medications AND have improvement in respiratory symptoms  - we also discussed close contacts would need 14 day quarantine after last close contact with patient  - recommended considering monoclonal antibody treatment- patient needs to reach back out if she agrees- I think this would be a good choice for her

## 2020-05-08 ENCOUNTER — Other Ambulatory Visit: Payer: Self-pay | Admitting: Family Medicine

## 2020-05-08 NOTE — Telephone Encounter (Signed)
  LAST APPOINTMENT DATE:02/26/2020  NEXT APPOINTMENT DATE:@10 /15/2021  MEDICATION: zolpidem (AMBIEN) 5 MG tablet  PHARMACY: [facility], [place] - [address] AT [place] OF [address]  COMMENTS: Patient is completely out

## 2020-05-14 ENCOUNTER — Ambulatory Visit: Payer: Medicare Other

## 2020-05-15 ENCOUNTER — Encounter: Payer: Self-pay | Admitting: Family Medicine

## 2020-05-15 ENCOUNTER — Other Ambulatory Visit: Payer: Self-pay

## 2020-05-15 ENCOUNTER — Ambulatory Visit (INDEPENDENT_AMBULATORY_CARE_PROVIDER_SITE_OTHER): Payer: Medicare Other | Admitting: Family Medicine

## 2020-05-15 VITALS — BP 138/88 | HR 77 | Temp 98.7°F | Resp 18 | Ht 66.0 in | Wt 139.6 lb

## 2020-05-15 DIAGNOSIS — I10 Essential (primary) hypertension: Secondary | ICD-10-CM

## 2020-05-15 DIAGNOSIS — R739 Hyperglycemia, unspecified: Secondary | ICD-10-CM | POA: Diagnosis not present

## 2020-05-15 DIAGNOSIS — N281 Cyst of kidney, acquired: Secondary | ICD-10-CM

## 2020-05-15 DIAGNOSIS — L299 Pruritus, unspecified: Secondary | ICD-10-CM

## 2020-05-15 DIAGNOSIS — Z1159 Encounter for screening for other viral diseases: Secondary | ICD-10-CM

## 2020-05-15 MED ORDER — LORAZEPAM 0.5 MG PO TABS
0.5000 mg | ORAL_TABLET | Freq: Every day | ORAL | 0 refills | Status: DC | PRN
Start: 2020-05-15 — End: 2020-11-03

## 2020-05-15 NOTE — Patient Instructions (Addendum)
Health Maintenance Due  Topic Date Due  . Hepatitis C Screening - with labs todya Never done  . TETANUS/TDAP - update this at your pharmacy and let us know 12/22/2019  - would do covid 19 booster prior to tetanus  We will call you within two weeks about your referral to MRI abdomen for renal cyst follow up. If you do not hear within 3 weeks, give [place] a call.   Please stop by lab before you go If you have mychart- we will send your results within 3 business days of [place] receiving them.  If you do not have mychart- we will call you about results within 5 business days of [place] receiving them.  *please note we are currently using [facility] which has a longer processing time than [facility] typically so labs may not come back as quickly as in the past *please also note that you will see labs on mychart as soon as they post. I will later go in and write notes on them- will say "notes from Dr. Yong Channel"   Recommended follow up: Return in about 6 months (around 11/13/2020) for physical or sooner if needed.

## 2020-05-15 NOTE — Progress Notes (Signed)
Phone [phone] In person visit   Subjective:   Sandra Craig is a 79 y.o. year old very pleasant female patient who presents for/with See problem oriented charting Chief Complaint  Patient presents with  . Blood Pressure Follow up   This visit occurred during the SARS-CoV-2 public health emergency.  Safety protocols were in place, including screening questions prior to the visit, additional usage of staff PPE, and extensive cleaning of exam room while observing appropriate contact time as indicated for disinfecting solutions.   Past Medical History-  Patient Active Problem List   Diagnosis Date Noted  . History of transient ischemic attack (TIA) 06/02/2017    Priority: High  . 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 Chronic low back pain 12/20/2013    Priority: High  . Fibromyalgia 12/05/2007    Priority: High  . UTI (urinary tract infection) 12/15/2018    Priority: Medium  . CKD (chronic kidney disease), stage III ([place]) 06/02/2017    Priority: Medium  . Near syncope 03/11/2017    Priority: Medium  . History of small bowel obstruction 06/28/2011    Priority: Medium  . GERD with stricture 06/28/2011    Priority: Medium  . Polycystic kidney disease     Priority: Medium  . Hypertension     Priority: Medium  . Hyperlipidemia     Priority: Medium  . Osteoporosis 12/21/2009    Priority: Medium  . INSOMNIA, CHRONIC 10/08/2008    Priority: Medium  . Depression 02/08/2008    Priority: Medium  . Chronic right shoulder pain 03/07/2014    Priority: Low  . Benign essential tremor 10/09/2013    Priority: Low  . Hyperglycemia 06/27/2012    Priority: Low  . Diverticulitis large intestine 02/11/2012    Priority: Low  . Irritable bowel syndrome (IBS) 06/28/2011    Priority: Low  . IC (interstitial cystitis)     Priority: Low  . Hemorrhoids 10/08/2008    Priority: Low  . 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 HEMATURIA UNSPECIFIED 07/04/2008    Priority: Low  . Lung nodules 08/09/2007    Priority: Low  . Facet arthropathy 09/14/2019  . Vaginal dryness 01/29/2019  . Major depressive disorder with single episode, in full remission ([place]) 01/29/2019    Medications- reviewed and updated Current Outpatient Medications  Medication Sig Dispense Refill  . amLODipine (NORVASC) 2.5 MG tablet Take 0.5 tablets (1.25 mg total) by mouth in the morning and at bedtime. Always take 1/2 tablet before bed. Can take 1/2 tablet in AM if blood pressure >165/100 90 tablet 3  . aspirin 81 MG tablet Take 1 tablet (81 mg total) by mouth daily. 30 tablet   . calcium-vitamin D 250-100 MG-UNIT tablet Take 1 tablet by mouth 2 (two) times daily.    Marland Kitchen denosumab (PROLIA) 60 MG/ML SOSY injection Inject 60 mg into the skin every 6 (six) months.    . DULoxetine (CYMBALTA) 30 MG capsule TAKE 1 CAPSULE BY MOUTH TWICE DAILY 90 capsule 3  . famotidine (PEPCID) 20 MG tablet Take 1 tablet (20 mg total) by mouth 2 (two) times daily. 180 tablet 3  . gabapentin (NEURONTIN) 300 MG capsule Take 1 capsule (300 mg total) by mouth 2 (two) times daily as needed (pain). TAKE 1 CAPSULE(300 MG) BY MOUTH TWICE DAILY AS NEEDED 180 capsule 3  . Multiple Vitamins-Minerals (MULTIVITAMIN WITH MINERALS) tablet Take 1 tablet by mouth  daily.    . Polyethyl Glycol-Propyl Glycol (SYSTANE) 0.4-0.3 % GEL ophthalmic gel Place 1 application into both eyes 3 (three) times daily as needed (dry eye).    . rosuvastatin (CRESTOR) 20 MG tablet Take 1 tablet (20 mg total) by mouth daily. 90 tablet 3  . zolpidem (AMBIEN) 5 MG tablet TAKE 1 TABLET BY MOUTH EVERY DAY AT BEDTIME FOR SLEEP 90 tablet 1  . LORazepam (ATIVAN) 0.5 MG tablet Take 1 tablet (0.5 mg total) by mouth daily as needed for anxiety or sedation (prior to MRI). 2 tablet 0  . traMADol (ULTRAM) 50 MG tablet Take 1 tablet (50 mg total) by mouth every 6 (six) hours as needed.  (Patient not taking: Reported on 05/15/2020) 20 tablet 0   No current facility-administered medications for this visit.     Objective:  BP 138/88   Pulse 77   Temp 98.7 F (37.1 C) (Temporal)   Resp 18   Ht 5\' 6"  (1.676 m)   Wt 139 lb 9.6 oz (63.3 kg)   LMP 08/25/1992   SpO2 98%   BMI 22.53 kg/m  Gen: NAD, resting comfortably CV: RRR no murmurs rubs or gallops Lungs: CTAB no crackles, wheeze, rhonchi Ext: no edema Skin: warm, dry     Assessment and Plan   #social update- sister passed recently - chemotherapy was not helpful- died within months of diagnosis. Traveling planned from end of December to middle of march- could ask for 90 day supplies or could send to pharmacy out of state.   #hypertension/history of presyncope S: medication: amlodipine 1.25 mg before bed and only had to take an additional 1.25 mg once since last visit. Slight swelling in hands comes and goes on med.  Home readings #s: 120-130/<80.  BP Readings from Last 3 Encounters:  05/15/20 138/88  02/28/20 (!) 139/72  02/26/20 (!) 155/85  A/P:  Excellent control at home- continue current medicines.   # Itching S:Patient mentioned that she has been having unexplained itching in several areas.   Started on right hand and worked up to elbow, then onto back and patch onto left upper abdomen. No bumps on the skin. New since last visit.   Wonders if loss of sister and stress with that and then getting married and moving.  A/P: unclear trigger- could be stress- evaluate liver funciton   # Hyperglycemia/insulin resistance/prediabetes S:  Medication: none Exercise and diet- not exercising but very active. Encouraged stability in weight. Reasonably healthy diet.  Lab Results  Component Value Date   HGBA1C 5.7 10/25/2019   HGBA1C 5.7 (H) 06/03/2017   HGBA1C 5.7 08/22/2012   A/P: hopefully [place]- update a1c with labs  #hyperlipidemia S: Medication:rosuvastatsin 0mg   Lab Results  Component Value Date    CHOL 149 10/25/2019   HDL 62.00 10/25/2019   LDLCALC 70 10/25/2019   LDLDIRECT 67.0 01/29/2019   TRIG 84.0 10/25/2019   CHOLHDL 2 10/25/2019   A/P: good control in march- continue current meds  #scheduled for prolia 19th of month for osteoporosis  #fibromyalgia -gabapentin helpful when not in flare. cymbalta somewhat helpful.   # insomnia- ambien 5 mg for sleep- has cut down from 10 mg  #renal cyst follow up needed- mri ordered  #hernia surgery went well in right groin, left still on wath.   Recommended follow up: Return in about 6 months (around 11/13/2020) for physical or sooner if needed. Future Appointments  Date Time Provider [place]  05/19/2020 12:00 PM [facility] NURSE [facility]  07/21/2020  2:40 PM Amaya Blakeman, [name], MD [id number]    Lab/Order associations:   ICD-10-CM   1. Primary hypertension  G38 COMPLETE METABOLIC PANEL WITH GFR  2. Acquired cyst of kidney  N28.1 MR ABDOMEN LIMITED  3. Hyperglycemia  R73.9 Hemoglobin A1c  4. Encounter for hepatitis C screening test for low risk patient  Z11.59 Hepatitis C antibody    Meds ordered this encounter  Medications  . LORazepam (ATIVAN) 0.5 MG tablet    Sig: Take 1 tablet (0.5 mg total) by mouth daily as needed for anxiety or sedation (prior to MRI).    Dispense:  2 tablet    Refill:  0   Return precautions advised.  Garret Reddish, MD

## 2020-05-18 LAB — HEPATITIS C ANTIBODY
Hepatitis C Ab: NONREACTIVE
SIGNAL TO CUT-OFF: 0.01 (ref <1.00)

## 2020-05-18 LAB — COMPLETE METABOLIC PANEL WITH GFR
AG Ratio: 1.7 (calc) (ref 1.0–2.5)
ALT: 21 U/L (ref 6–29)
AST: 28 U/L (ref 10–35)
Albumin: 4 g/dL (ref 3.6–5.1)
Alkaline phosphatase (APISO): 60 U/L (ref 37–153)
BUN/Creatinine Ratio: 12 (calc) (ref 6–22)
BUN: 17 mg/dL (ref 7–25)
CO2: 26 mmol/L (ref 20–32)
Calcium: 9.1 mg/dL (ref 8.6–10.4)
Chloride: 108 mmol/L (ref 98–110)
Creat: 1.42 mg/dL — ABNORMAL HIGH (ref 0.60–0.93)
GFR, Est African American: 41 mL/min/{1.73_m2} — ABNORMAL LOW (ref >=60)
GFR, Est Non African American: 35 mL/min/{1.73_m2} — ABNORMAL LOW (ref >=60)
Globulin: 2.3 g/dL (calc) (ref 1.9–3.7)
Glucose, Bld: 76 mg/dL (ref 65–99)
Potassium: 4.7 mmol/L (ref 3.5–5.3)
Sodium: 142 mmol/L (ref 135–146)
Total Bilirubin: 0.4 mg/dL (ref 0.2–1.2)
Total Protein: 6.3 g/dL (ref 6.1–8.1)

## 2020-05-18 LAB — HEMOGLOBIN A1C
Hgb A1c MFr Bld: 5.4 % of total Hgb (ref <5.7)
Mean Plasma Glucose: 108 (calc)
eAG (mmol/L): 6 (calc)

## 2020-05-19 ENCOUNTER — Other Ambulatory Visit: Payer: Self-pay

## 2020-05-19 ENCOUNTER — Ambulatory Visit (INDEPENDENT_AMBULATORY_CARE_PROVIDER_SITE_OTHER): Payer: Medicare Other | Admitting: *Deleted

## 2020-05-19 DIAGNOSIS — M810 Age-related osteoporosis without current pathological fracture: Secondary | ICD-10-CM | POA: Diagnosis not present

## 2020-05-19 MED ORDER — DENOSUMAB 60 MG/ML ~~LOC~~ SOSY
60.0000 mg | PREFILLED_SYRINGE | Freq: Once | SUBCUTANEOUS | Status: AC
  Administered 2020-05-19: 60 mg via SUBCUTANEOUS

## 2020-06-18 DIAGNOSIS — M545 Low back pain, unspecified: Secondary | ICD-10-CM | POA: Diagnosis not present

## 2020-06-18 DIAGNOSIS — M461 Sacroiliitis, not elsewhere classified: Secondary | ICD-10-CM | POA: Diagnosis not present

## 2020-06-18 DIAGNOSIS — M4316 Spondylolisthesis, lumbar region: Secondary | ICD-10-CM | POA: Diagnosis not present

## 2020-06-18 DIAGNOSIS — M47816 Spondylosis without myelopathy or radiculopathy, lumbar region: Secondary | ICD-10-CM | POA: Diagnosis not present

## 2020-06-19 DIAGNOSIS — M47816 Spondylosis without myelopathy or radiculopathy, lumbar region: Secondary | ICD-10-CM | POA: Diagnosis not present

## 2020-06-19 DIAGNOSIS — M4316 Spondylolisthesis, lumbar region: Secondary | ICD-10-CM | POA: Diagnosis not present

## 2020-07-02 ENCOUNTER — Telehealth: Payer: Self-pay

## 2020-07-02 NOTE — Telephone Encounter (Signed)
Pt has not been contacted about her renal MRI

## 2020-07-09 NOTE — Telephone Encounter (Signed)
Spoke to the patient today and she would like to get this scheduled around her schedule so she asked me to leave her a VM with the number to call-I did this

## 2020-07-14 DIAGNOSIS — M47816 Spondylosis without myelopathy or radiculopathy, lumbar region: Secondary | ICD-10-CM | POA: Diagnosis not present

## 2020-07-14 DIAGNOSIS — M461 Sacroiliitis, not elsewhere classified: Secondary | ICD-10-CM | POA: Diagnosis not present

## 2020-07-14 DIAGNOSIS — M4316 Spondylolisthesis, lumbar region: Secondary | ICD-10-CM | POA: Diagnosis not present

## 2020-07-15 DIAGNOSIS — K4090 Unilateral inguinal hernia, without obstruction or gangrene, not specified as recurrent: Secondary | ICD-10-CM | POA: Diagnosis not present

## 2020-07-20 NOTE — Progress Notes (Signed)
Phone [phone]   Subjective:  Patient presents today for their annual physical. Chief complaint-noted.   See problem oriented charting- ROS- full  review of systems was completed and negative except for: dental issues, tinnitus, constipation, flank pain, back pain, neck pain, dizzy, lightheaded, agitated, confused, sad mood, anxious, sleep trouble No SI.   The following were reviewed and entered/updated in epic: Past Medical History:  Diagnosis Date  . Arthritis   . 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 Chronic insomnia   . Cystitis   . Depression   . Diverticulitis of intestine without perforation or abscess without bleeding    Patient did have abscess but noperforation  . Diverticulosis of colon (without mention of hemorrhage)   . Endometriosis   . Family history of malignant neoplasm of gastrointestinal tract   . Fibromyalgia   . Gastritis   . Hiatal hernia   . HIATAL HERNIA 10/08/2008   Qualifier: Diagnosis of  By: Nils Pyle CMA ([place]), Mearl Latin    . History of gallstones   . Hyperlipidemia   . Hypertension   . IBS (irritable bowel syndrome)   . IC (interstitial cystitis)   . Internal hemorrhoid   . Osteonecrosis ([place])   . Osteopenia   . Osteoporosis   . Palpitations   . Polycystic kidney disease   . Pulmonary nodule 12/08   5 mm Anterior RUL  . Small bowel obstruction ([place])   . Stroke [facility])    TIA   Patient Active Problem List   Diagnosis Date Noted  . History of transient ischemic attack (TIA) 06/02/2017    Priority: High  . 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 Chronic low back pain 12/20/2013    Priority: High  . Fibromyalgia 12/05/2007    Priority: High  . UTI (urinary tract infection) 12/15/2018    Priority: Medium  . CKD (chronic kidney disease), stage III ([place]) 06/02/2017    Priority: Medium  . Near syncope 03/11/2017    Priority: Medium  . History of small bowel obstruction  06/28/2011    Priority: Medium  . GERD with stricture 06/28/2011    Priority: Medium  . Polycystic kidney disease     Priority: Medium  . Hypertension     Priority: Medium  . Hyperlipidemia     Priority: Medium  . Osteoporosis 12/21/2009    Priority: Medium  . INSOMNIA, CHRONIC 10/08/2008    Priority: Medium  . Depression 02/08/2008    Priority: Medium  . Facet arthropathy 09/14/2019    Priority: Low  . Vaginal dryness 01/29/2019    Priority: Low  . Chronic right shoulder pain 03/07/2014    Priority: Low  . Benign essential tremor 10/09/2013    Priority: Low  . Hyperglycemia 06/27/2012    Priority: Low  . Diverticulitis large intestine 02/11/2012    Priority: Low  . Irritable bowel syndrome (IBS) 06/28/2011    Priority: Low  . IC (interstitial cystitis)     Priority: Low  . Hemorrhoids 10/08/2008    Priority: Low  . 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 HEMATURIA UNSPECIFIED 07/04/2008    Priority: Low  . Lung nodules 08/09/2007    Priority: Low  . Memory loss 07/21/2020   Past Surgical History:  Procedure Laterality Date  . ABDOMINAL HYSTERECTOMY  1994   TAH,BSO FOR ENDOMETRIOSIS  . ABDOMINAL SURGERY  2011   small intestine blockage  . CHOLECYSTECTOMY    .  GASTROPLASTY  2011   small bowel resection -open  . INGUINAL HERNIA REPAIR Right 02/28/2020   Procedure: LAPAROSCOPIC RIGHT INGUINAL HERNIA REPAIR WITH MESH;  Surgeon: Ralene Ok, MD;  Location: [place];  Service: General;  Laterality: Right;  . JOINT REPLACEMENT  2008  . OOPHORECTOMY  1994   TAH,BSO  . PELVIC LAPAROSCOPY    . S/P right shoulder rotater cuff  200216/2011   Tear/adhesive capsulitis  . SBO Lap  11   Adhesions and small internal hernia  . SHOULDER HEMI-ARTHROPLASTY  06/05/2012   Procedure: SHOULDER HEMI-ARTHROPLASTY;  Surgeon: Johnny Bridge, MD;  Location: [place];  Service: Orthopedics;  Laterality: Right;  FOR ARTHRITIS  . TOTAL HIP ARTHROPLASTY   FALL OF 2008   rt. partial hip replacement  . TOTAL SHOULDER ARTHROPLASTY  06/05/2012   Procedure: TOTAL SHOULDER ARTHROPLASTY;  Surgeon: Johnny Bridge, MD;  Location: [place];  Service: Orthopedics;  Laterality: Right;  RIGHT SHOULDER TOTAL ARTHROPLASTY, HEMIARTHROPLASTY, SHOULDER, FOR ARTHRITIS  . VIDEO BRONCHOSCOPY Bilateral 01/28/2015   Procedure: VIDEO BRONCHOSCOPY WITH FLUORO;  Surgeon: Collene Gobble, MD;  Location: [place];  Service: Cardiopulmonary;  Laterality: Bilateral;  . VIDEO BRONCHOSCOPY Bilateral 04/23/2019   Procedure: VIDEO BRONCHOSCOPY WITHOUT FLUORO;  Surgeon: Collene Gobble, MD;  Location[facility] ENDOSCOPY;  Service: Cardiopulmonary;  Laterality: Bilateral;    Family History  Problem Relation Age of Onset  . Heart disease Mother        MI at age 54  . Emphysema Mother   . Thyroid disease Mother        Thyroidectomy/Benign  . Lymphoma Maternal Grandmother   . Colon cancer Paternal Grandmother   . COPD Father   . Cancer Brother        adenocarcinoma right lung  . COPD Brother   . Heart attack Paternal Grandfather   . Pancreatic cancer Sister        died 70  . Esophageal cancer Neg Hx   . Rectal cancer Neg Hx   . Stomach cancer Neg Hx     Medications- reviewed and updated Current Outpatient Medications  Medication Sig Dispense Refill  . amLODipine (NORVASC) 2.5 MG tablet Take 0.5 tablets (1.25 mg total) by mouth in the morning and at bedtime. Always take 1/2 tablet before bed. Can take 1/2 tablet in AM if blood pressure >165/100 90 tablet 3  . aspirin 81 MG tablet Take 1 tablet (81 mg total) by mouth daily. 30 tablet   . calcium-vitamin D 250-100 MG-UNIT tablet Take 1 tablet by mouth 2 (two) times daily.    Marland Kitchen denosumab (PROLIA) 60 MG/ML SOSY injection Inject 60 mg into the skin every 6 (six) months.    . DULoxetine (CYMBALTA) 30 MG capsule TAKE 1 CAPSULE BY MOUTH TWICE DAILY 90 capsule 3  . famotidine (PEPCID) 20 MG tablet Take 1 tablet (20 mg total) by mouth  2 (two) times daily. 180 tablet 3  . gabapentin (NEURONTIN) 300 MG capsule Take 1 capsule (300 mg total) by mouth 2 (two) times daily as needed (pain). TAKE 1 CAPSULE(300 MG) BY MOUTH TWICE DAILY AS NEEDED 180 capsule 3  . LORazepam (ATIVAN) 0.5 MG tablet Take 1 tablet (0.5 mg total) by mouth daily as needed for anxiety or sedation (prior to MRI). 2 tablet 0  . Multiple Vitamins-Minerals (MULTIVITAMIN WITH MINERALS) tablet Take 1 tablet by mouth daily.    Vladimir Faster Glycol-Propyl Glycol (SYSTANE) 0.4-0.3 % GEL ophthalmic gel Place 1 application into both eyes  3 (three) times daily as needed (dry eye).    . rosuvastatin (CRESTOR) 20 MG tablet Take 1 tablet (20 mg total) by mouth daily. 90 tablet 3  . traMADol (ULTRAM) 50 MG tablet Take 1 tablet (50 mg total) by mouth every 6 (six) hours as needed. 20 tablet 0  . zolpidem (AMBIEN) 5 MG tablet TAKE 1 TABLET BY MOUTH EVERY DAY AT BEDTIME FOR SLEEP 90 tablet 1   No current facility-administered medications for this visit.    Allergies-reviewed and updated Allergies  Allergen Reactions  . Azithromycin Rash    Pruritic rash diffuse   . Celecoxib Nausea Only    Social History   Social History Narrative   Married 2021- selling condo and moving into husbands home. Married to Long term boyfriend 2 years prior 2021. Prior Widowed.       Enjoys reading    Objective  Objective:  BP 130/80   Pulse 78   Temp 98.5 F (36.9 C) (Temporal)   Resp 18   Ht 5\' 6"  (1.676 m)   Wt 142 lb (64.4 kg)   LMP 08/25/1992   SpO2 98%   BMI 22.92 kg/m  Gen: NAD, resting comfortably HEENT: Mucous membranes are moist. Oropharynx normal Neck: no thyromegaly CV: RRR no murmurs rubs or gallops Lungs: CTAB no crackles, wheeze, rhonchi Abdomen: soft/nontender/nondistended/normal bowel sounds. No rebound or guarding. Slight pain in left groin at hernia sight with palpation Ext: no edema Skin: warm, dry Neuro: grossly normal, moves all extremities, PERRLA    Assessment and Plan   79 y.o. female presenting for annual physical.  Health Maintenance counseling: 1. Anticipatory guidance: Patient counseled regarding regular dental exams q6 months, eye exams- yearly,  avoiding smoking and second hand smoke, limiting alcohol to 1 beverage per day.   2. Risk factor reduction:  Advised patient of need for regular exercise and diet rich and fruits and vegetables to reduce risk of heart attack and stroke. Exercise- trying to be active in new home- but no regular exercise- discussed adding exercise. Diet-could improve on breakfast (pop tart).  Wt Readings from Last 3 Encounters:  07/21/20 142 lb (64.4 kg)  05/15/20 139 lb 9.6 oz (63.3 kg)  02/28/20 142 lb 3.2 oz (64.5 kg)   3. Immunizations/screenings/ancillary studies -would recommend tetanus shot/Tdap at pharmacy- cheaper there. Will call us back with date of booster Immunization History  Administered Date(s) Administered  . Fluad Quad(high Dose 65+) 04/02/2019, 04/05/2020  . Influenza Split 05/23/2012  . Influenza Whole 05/31/2006, 05/28/2007, 05/14/2008, 04/22/2009, 05/01/2010, 04/12/2011  . Influenza, High Dose Seasonal PF 05/19/2013, 03/26/2014, 04/21/2016, 04/21/2017, 04/21/2018, 04/02/2019  . Influenza,inj,Quad PF,6+ Mos 04/15/2015  . Influenza-Unspecified 04/29/2018  . PFIZER SARS-COV-2 Vaccination 09/07/2019, 09/27/2019  . Pneumococcal Conjugate-13 02/03/2015  . Pneumococcal Polysaccharide-23 05/31/2006, 06/07/2012  . Td 12/21/2009  . Zoster 02/06/2006  4. Cervical cancer screening- still sees gynecology- past age based screening 5. Breast cancer screening-  breast exam with GYN and mammogram 03/20/20 with 1 year repeat- prefers to continue 6. Colon cancer screening - 09/12/19 with 6 precancerous polyps- plan is for 3 year sit down to discuss whether to repeat or not. Hemangioma noted during colonoscopy in posterior pharaynx- saw ENT who recommended no further follow up 7. Skin cancer screening-  sees Dr. Elvera Lennox [place] derm. advised regular sunscreen use. Denies worrisome, changing, or new skin lesions.  8. Birth control/STD check- monogomous with husband/post menopause 9. Osteoporosis screening at 45- patient with known osteoporosis.  Prolia injection occurred on 05/19/20 (  less muscle aches) with plan for every 6 months- they call closer to time to schedule next. Takes calcium and vitamin D. Due for bone density- wants to wait until back in state  -Former smoker- quit in 1970s- no regular screening  Status of chronic or acute concerns   #Social update-checked in with patient after passing of sister in the second half of this year. Married this year but feels they got along better before married- confusion issues have been hard for them to manage. Husband wants more touching and that's hard for her too. hasnt been as good as hoped. That's one reason she treasures sleeping so much/sleeping meds.   #Unexplained itching reported last visit-LFTs were normal.  Today patient reports ongoing issue but not worsening. Rash under braline- cortisone helps and will use this as needed   #hyperlipidemia/history of TIA S: Medication: rosuvastatin 20Mg , on aspirin for primary prevention Lab Results  Component Value Date   CHOL 149 10/25/2019   HDL 62.00 10/25/2019   LDLCALC 70 10/25/2019   LDLDIRECT 67.0 01/29/2019   TRIG 84.0 10/25/2019   CHOLHDL 2 10/25/2019   A/P: Reasonable control with LDL at 70-continue current medications -also some senile purpura on aspirin- would prefer to stop but with TIA history advised against.   # Hyperglycemia/insulin resistance/prediabetes S:  Medication: None Exercise and diet- trying to eat healthy- encouraged exercise Lab Results  Component Value Date   HGBA1C 5.4 05/15/2020   HGBA1C 5.7 10/25/2019   HGBA1C 5.7 (H) 06/03/2017   A/P: Thankfully numbers improved on last visit-recheck next year  # Depression S: Medication:cymbalta 30mg  twice daily (also for  fibromyalgia)  Stressors as above- feels this is worse  A/P: worsened control- recommended adding counseling. She actually wants to reduce meds but will hold for now as we reduce ambien as first step  #hypertension/history of presyncope S: medication: amlodipine 1.25 mg before bed and takes an additional 1.25 mg if blood pressure is elevated.  Fortunately she has done well without presyncope on this regimen until recently.  Home readings #s:  Last Tuesday got down to 102/57 after lunch- only episode in last 2 months- but also getting #s 150s some, only 160 once. Palpitations with this episode. 30 day monitor in past in 2018 reassuring.  BP Readings from Last 3 Encounters:  07/21/20 130/80  05/15/20 138/88  02/28/20 (!) 139/72  A/P: reasonably stable- if gets more low episodes will let us know. Has had syncopal episodes in the past -thinks could hav ebeen dehydrated that day- encouraged to push fluids or even some salty fluids like gatorade  #Fibromyalgia-doing well with gabapentin when not in a flareup.  Cymbalta also helpful   #insomnia- see discussion below. Willing to refill though trying to titrate off  #Right groin hernia status post repair-still with left side on watch. Watching with surgery- having some pain  #Renal cyst-ordered repeat imaging last visit.  Got a request from radiology to his transition this to with and without contrast-I reached back out to them and awaiting answer with her GFR in the low 30s I prefer not to use contrast plus original radiologist did not recommend contrast   #iron deficiency- takes MV with iron- check ferritin Lab Results  Component Value Date   FERRITIN 25.4 10/25/2019    #Memory loss-patient previously reported memory concerns.  MRI was ordered in April 2021 which was not completed-ordered to rule out stroke with history of TIA.  We had discussed insomnia medications could contribute.  Also discussed  possible neuropsychological testing or  neurology referral.  Low risk STDs and declined HIV/RPR testing.  TSH was normal.  B 12 was low normal-we discussed potential supplementation today  - she reports confusion, feeling mad at times. Tested for memory with wake hearing aid study and she feels worsening.  Also has night terrors - reports for 1.5 years from current husband- had not woken up before but more recently has been waking up. Screaming in her sleep but cannot remember these episodes for most part- does remember one were she was being chased. Apparently had years ago.  -getting ready to go to [facility] for 2 months - Trial off of ambien- start with half tablet for a month before stopping - Need to get MR brain done- please call maybe 2 weeks before you will be back in town -she also asks about lower antidepressant- would want to do this after we get her off Ambien.  -discussed neurology appointment- she wants to see how does off meds first- had seen Dr. Leonie Man in the past- could refer back to his group  # MAI- monitoring off treatment right now- doing reasonably well   Recommended follow up: Return in about 3 months (around 10/19/2020).  Lab/Order associations:non fasting   ICD-10-CM   1. Encounter for general adult medical examination with abnormal findings  Z00.01 Lipid panel    CBC with Differential/Platelet    COMPLETE METABOLIC PANEL WITH GFR    Iron, TIBC and Ferritin Panel  2. Primary hypertension  I10 Lipid panel    CBC with Differential/Platelet    COMPLETE METABOLIC PANEL WITH GFR  3. GERD with stricture  K21.9    K22.2   4. Hyperglycemia  R73.9   5. Hyperlipidemia, unspecified hyperlipidemia type  E78.5 Lipid panel    CBC with Differential/Platelet    COMPLETE METABOLIC PANEL WITH GFR  6. Iron deficiency anemia, unspecified iron deficiency anemia type  D50.9 Iron, TIBC and Ferritin Panel  7. Senile purpura ([facility])  D69.2   8. Memory loss  R41.3    Return precautions advised.  Garret Reddish, MD

## 2020-07-20 NOTE — Patient Instructions (Addendum)
Please stop by lab before you go If you have mychart- we will send your results within 3 business days of [place] receiving them.  If you do not have mychart- we will call you about results within 5 business days of [place] receiving them.  *please note we are currently using [facility] which has a longer processing time than [facility] typically so labs may not come back as quickly as in the past *please also note that you will see labs on mychart as soon as they post. I will later go in and write notes on them- will say "notes from Dr. Yong Channel"  Health Maintenance Due  Topic Date Due  . TETANUS/TDAP Cheaper to receive at your local pharmacy. 12/22/2019  . COVID-19 Vaccine (3 - Booster for [employer] series) Will call back with the date of her booster shot. 03/26/2020   Would recommend b12 high dose 1050mcg a day- as low b12 can be associated with memory changes  Trial off of ambien- start with half tablet for a month before stopping  Need to get MR brain done- please call maybe 2 weeks before you will be back in town  Please call [phone] to schedule a visit with [facility] -Trey Paula is an excellent counselor who is based out of our clinic - also Karna Christmas Bauert does [facility]BT[facility] which focuses on sleep issues- she might be a great fit  When you are back in town lets get both MRIs done- brain and abdomen- please call us back perhaps 2 weeks before you get back in [place]. New or worsening symptoms let us know asap.

## 2020-07-21 ENCOUNTER — Other Ambulatory Visit: Payer: Self-pay

## 2020-07-21 ENCOUNTER — Encounter: Payer: Self-pay | Admitting: Family Medicine

## 2020-07-21 ENCOUNTER — Ambulatory Visit (INDEPENDENT_AMBULATORY_CARE_PROVIDER_SITE_OTHER): Payer: Medicare Other | Admitting: Family Medicine

## 2020-07-21 VITALS — BP 130/80 | HR 78 | Temp 98.5°F | Resp 18 | Ht 66.0 in | Wt 142.0 lb

## 2020-07-21 DIAGNOSIS — I10 Essential (primary) hypertension: Secondary | ICD-10-CM | POA: Diagnosis not present

## 2020-07-21 DIAGNOSIS — Z0001 Encounter for general adult medical examination with abnormal findings: Secondary | ICD-10-CM | POA: Diagnosis not present

## 2020-07-21 DIAGNOSIS — R739 Hyperglycemia, unspecified: Secondary | ICD-10-CM | POA: Diagnosis not present

## 2020-07-21 DIAGNOSIS — D692 Other nonthrombocytopenic purpura: Secondary | ICD-10-CM

## 2020-07-21 DIAGNOSIS — K222 Esophageal obstruction: Secondary | ICD-10-CM

## 2020-07-21 DIAGNOSIS — R413 Other amnesia: Secondary | ICD-10-CM

## 2020-07-21 DIAGNOSIS — K219 Gastro-esophageal reflux disease without esophagitis: Secondary | ICD-10-CM | POA: Diagnosis not present

## 2020-07-21 DIAGNOSIS — E785 Hyperlipidemia, unspecified: Secondary | ICD-10-CM

## 2020-07-21 DIAGNOSIS — D509 Iron deficiency anemia, unspecified: Secondary | ICD-10-CM

## 2020-07-21 NOTE — Assessment & Plan Note (Signed)
#  Memory loss-patient previously reported memory concerns.  MRI was ordered in April 2021 which was not completed-ordered to rule out stroke with history of TIA.  We had discussed insomnia medications could contribute.  Also discussed possible neuropsychological testing or neurology referral.  Low risk STDs and declined HIV/RPR testing.  TSH was normal.  B 12 was low normal-we discussed potential supplementation today  - she reports confusion, feeling mad at times. Tested for memory with wake hearing aid study and she feels worsening.  Also has night terrors - reports for 1.5 years from current husband- had not woken up before but more recently has been waking up. Screaming in her sleep but cannot remember these episodes for most part- does remember one were she was being chased. Apparently had years ago.  -getting ready to go to [facility] for 2 months - Trial off of ambien- start with half tablet for a month before stopping - Need to get MR brain done- please call maybe 2 weeks before you will be back in town -she also asks about lower antidepressant- would want to do this after we get her off Ambien.  -discussed neurology appointment- she wants to see how does off meds first- had seen Dr. Leonie Man in the past- could refer back to his group

## 2020-07-21 NOTE — Addendum Note (Signed)
Addended by: Brandy Hale on: 07/21/2020 04:03 PM   Modules accepted: Orders

## 2020-07-22 LAB — CBC WITH DIFFERENTIAL/PLATELET
Absolute Monocytes: 462 cells/uL (ref 200–950)
Basophils Absolute: 42 cells/uL (ref 0–200)
Basophils Relative: 0.7 %
Eosinophils Absolute: 108 cells/uL (ref 15–500)
Eosinophils Relative: 1.8 %
HCT: 39.3 % (ref 35.0–45.0)
Hemoglobin: 12.9 g/dL (ref 11.7–15.5)
Lymphs Abs: 1398 cells/uL (ref 850–3900)
MCH: 29.7 pg (ref 27.0–33.0)
MCHC: 32.8 g/dL (ref 32.0–36.0)
MCV: 90.3 fL (ref 80.0–100.0)
MPV: 10.6 fL (ref 7.5–12.5)
Monocytes Relative: 7.7 %
Neutro Abs: 3990 cells/uL (ref 1500–7800)
Neutrophils Relative %: 66.5 %
Platelets: 171 10*3/uL (ref 140–400)
RBC: 4.35 10*6/uL (ref 3.80–5.10)
RDW: 13.1 % (ref 11.0–15.0)
Total Lymphocyte: 23.3 %
WBC: 6 10*3/uL (ref 3.8–10.8)

## 2020-07-22 LAB — COMPLETE METABOLIC PANEL WITH GFR
AG Ratio: 1.7 (calc) (ref 1.0–2.5)
ALT: 21 U/L (ref 6–29)
AST: 22 U/L (ref 10–35)
Albumin: 4 g/dL (ref 3.6–5.1)
Alkaline phosphatase (APISO): 55 U/L (ref 37–153)
BUN/Creatinine Ratio: 17 (calc) (ref 6–22)
BUN: 28 mg/dL — ABNORMAL HIGH (ref 7–25)
CO2: 29 mmol/L (ref 20–32)
Calcium: 10 mg/dL (ref 8.6–10.4)
Chloride: 106 mmol/L (ref 98–110)
Creat: 1.61 mg/dL — ABNORMAL HIGH (ref 0.60–0.93)
GFR, Est African American: 35 mL/min/{1.73_m2} — ABNORMAL LOW (ref >=60)
GFR, Est Non African American: 30 mL/min/{1.73_m2} — ABNORMAL LOW (ref >=60)
Globulin: 2.3 g/dL (calc) (ref 1.9–3.7)
Glucose, Bld: 91 mg/dL (ref 65–99)
Potassium: 4 mmol/L (ref 3.5–5.3)
Sodium: 144 mmol/L (ref 135–146)
Total Bilirubin: 0.4 mg/dL (ref 0.2–1.2)
Total Protein: 6.3 g/dL (ref 6.1–8.1)

## 2020-07-22 LAB — IRON,TIBC AND FERRITIN PANEL
%SAT: 20 % (calc) (ref 16–45)
Ferritin: 18 ng/mL (ref 16–288)
Iron: 67 ug/dL (ref 45–160)
TIBC: 330 mcg/dL (calc) (ref 250–450)

## 2020-07-22 LAB — LIPID PANEL
Cholesterol: 160 mg/dL (ref <200)
HDL: 68 mg/dL (ref >=50)
LDL Cholesterol (Calc): 75 mg/dL (calc)
Non-HDL Cholesterol (Calc): 92 mg/dL (calc) (ref <130)
Total CHOL/HDL Ratio: 2.4 (calc) (ref <5.0)
Triglycerides: 89 mg/dL (ref <150)

## 2020-07-24 DIAGNOSIS — Z1152 Encounter for screening for COVID-19: Secondary | ICD-10-CM | POA: Diagnosis not present

## 2020-08-04 DIAGNOSIS — Z6823 Body mass index (BMI) 23.0-23.9, adult: Secondary | ICD-10-CM | POA: Diagnosis not present

## 2020-08-04 DIAGNOSIS — Z719 Counseling, unspecified: Secondary | ICD-10-CM | POA: Diagnosis not present

## 2020-08-04 DIAGNOSIS — N3001 Acute cystitis with hematuria: Secondary | ICD-10-CM | POA: Diagnosis not present

## 2020-08-05 ENCOUNTER — Other Ambulatory Visit: Payer: Self-pay

## 2020-08-05 MED ORDER — GABAPENTIN 300 MG PO CAPS
300.0000 mg | ORAL_CAPSULE | Freq: Two times a day (BID) | ORAL | 3 refills | Status: DC | PRN
Start: 2020-08-05 — End: 2021-05-17

## 2020-08-07 ENCOUNTER — Other Ambulatory Visit: Payer: Self-pay

## 2020-08-07 MED ORDER — ROSUVASTATIN CALCIUM 40 MG PO TABS: 40.0000 mg | ORAL_TABLET | Freq: Every day | ORAL | 3 refills | Status: DC

## 2020-08-14 DIAGNOSIS — J029 Acute pharyngitis, unspecified: Secondary | ICD-10-CM | POA: Diagnosis not present

## 2020-08-14 DIAGNOSIS — Z20822 Contact with and (suspected) exposure to covid-19: Secondary | ICD-10-CM | POA: Diagnosis not present

## 2020-08-14 DIAGNOSIS — U071 COVID-19: Secondary | ICD-10-CM | POA: Diagnosis not present

## 2020-08-14 DIAGNOSIS — R059 Cough, unspecified: Secondary | ICD-10-CM | POA: Diagnosis not present

## 2020-08-17 DIAGNOSIS — B342 Coronavirus infection, unspecified: Secondary | ICD-10-CM | POA: Diagnosis not present

## 2020-08-18 ENCOUNTER — Telehealth: Payer: Self-pay

## 2020-08-18 NOTE — Telephone Encounter (Signed)
FYI

## 2020-08-18 NOTE — Telephone Encounter (Signed)
Nurse Assessment Nurse: Gildardo Pounds, RN, Amy Date/Time Eilene Ghazi Time): 08/18/2020 9:55:47 AM Confirm and document reason for call. If symptomatic, describe symptoms. ---Caller states she is on vacation and tested positive for Covid last Friday, the 14th. She wants to know what she should do. She has a sore throat and body aches. She has a slight HA & runny nose. No fever. She did have a nosebleed this morning, but it stopped within 30 minutes. Does the patient have any new or worsening symptoms? ---Yes Will a triage be completed? ---Yes Related visit to physician within the last 2 weeks? ---No Does the PT have any chronic conditions? (i.e. diabetes, asthma, this includes High risk factors for pregnancy, etc.) ---Yes List chronic conditions. ---TMH9Q, MAC Is this a behavioral health or substance abuse call? ---No Guidelines Guideline Title Affirmed Question Affirmed Notes Nurse Date/Time (Eastern Time) COVID-19 - Diagnosed or Suspected HIGH RISK for severe COVID complications (e.g., age > 61 years, obesity with BMI > 71, pregnant, chronic lung disease or other chronic medical condition) (Exception: Already seen [place], RN, [place] 08/18/2020 9:57:55 AM PLEASE NOTE: All timestamps contained within this report are represented as [place] Standard Time. CONFIDENTIALTY NOTICE: This fax transmission is intended only for the addressee. It contains information that is legally privileged, confidential or otherwise protected from use or disclosure. If you are not the intended recipient, you are strictly prohibited from reviewing, disclosing, copying using or disseminating any of this information or taking any action in reliance on or regarding this information. If you have received this fax in error, please notify us immediately by telephone so that we can arrange for its return to [place]. Phone: [phone], Toll-Free: [phone], Fax: [phone] Page: 2 of 2 Call Id:  [id number] Guidelines Guideline Title Affirmed Question Affirmed Notes Nurse Date/Time Eilene Ghazi Time) by PCP and no new or worsening symptoms.) Disp. Time Eilene Ghazi Time) Disposition Final User 08/18/2020 9:48:11 AM Send To RN Personal Turner, RN, Eugene Garnet 08/18/2020 10:07:57 AM Paged On Call back to [facility], [place], Amy 08/18/2020 10:04:07 AM Call PCP Now Yes Lovelace, RN, Amy Caller Disagree/Comply Comply Caller Understands Yes PreDisposition InappropriateToAsk Care Advice Given Per Guideline CALL PCP NOW: * You need to discuss this with your doctor (or NP/PA). * I'll page the on-call provider now. If you haven't heard from the provider (or me) within 30 minutes, call again. HOW TO PROTECT OTHERS - WHEN YOU ARE SICK WITH COVID-19: * STAY HOME A MINIMUM OF 10 DAYS: Home isolation is needed for at least 10 days after the symptoms started. Stay home from school or work if you are sick. Do NOT go to religious services, child care centers, shopping, or other public places. Do NOT use public transportation (e.g., bus, taxis, ride-sharing). Do NOT allow any visitors to your home. Leave the house only if you need to seek urgent medical care. GENERAL CARE ADVICE FOR COVID-19 SYMPTOMS: * The treatment is the same whether you have COVID-19, influenza or some other respiratory virus. * Cough: Use cough drops. * Sore throat: Try throat lozenges, hard candy or warm chicken broth. CALL BACK IF: * You become worse CARE ADVICE given per COVID-19 - DIAGNOSED OR SUSPECTED (Adult) guideline. Comments User: Wayne Sever, RN Date/Time Eilene Ghazi Time): 08/18/2020 10:05:43 AM Caller states her husband did a telehealth visit, & it was recommended that he get the antibody infusion. The supply is limited in [place]. They would have to go to [place], which is 150 miles away from where they are located. User: Warren Lacy,  Lovelace, RN Date/Time Eilene Ghazi Time): 08/18/2020 10:14:50 AM Notified caller of Dr. Rosezetta Schlatter  instructions. Paging DoctorName Phone DateTime Result/Outcome Message Type Notes Dr. Celso Amy [phone] 08/18/2020 10:07:57 AM Called On Call Provider - Left Message Doctor Paged Dr. Celso Amy 08/18/2020 10:13:29 AM Spoke with On Call - General Message Result Notified Dr. Quay Burow of patient condition & Outcome. She stated the patient can treat her symptoms with OTC cold medicines. If she wants, she can call at noon when the office opens to do a virtual visit to discuss further options

## 2020-08-18 NOTE — Telephone Encounter (Signed)
Lets call to check on her and offer virtual visit

## 2020-08-19 NOTE — Progress Notes (Addendum)
Phone [phone] Virtual visit via phonenote   Subjective:   Chief Complaint  Patient presents with  . Covid Positive    Tested positive 1/16, symptoms started 1/13. Symptoms include - sore throat, runny nose, headache, and dry cough Patient is currently in [place] - if any meds sent in, please send to [facility] [id number], [place] [address] Is already scheduled for antibody infusions in [place]   . Medication Refill    Please refill her Pepcid and send it to the pharmacy in [place] because she left that medication here in [place]    This visit type was conducted due to national recommendations for restrictions regarding the COVID-19 Pandemic (e.g. social distancing).  This format is felt to be most appropriate for this patient at this time balancing risks to patient and risks to population by having him in for in person visit.  All issues noted in this document were discussed and addressed.  No physical exam was performed (except for noted visual exam or audio findings with Telehealth visits).  The patient has consented to conduct a Telehealth visit and understands insurance will be billed.   Our team/I connected with Reggie Pile at  2:20 PM EST by phone (patient did not have equipment for webex) and verified that I am speaking with the correct person using two identifiers.  Location patient: vacation home in [place] Location provider: [facility], office Persons participating in the virtual visit:  patient  Time on phone: 14 minutes Counseling provided about COVID-19 treatments, monoclonal antibodies  Our team/I discussed the limitations of evaluation and management by telemedicine and the availability of in person appointments. In light of current covid-19 pandemic, patient also understands that we are trying to protect them by minimizing in office contact if at all possible.  The patient expressed consent for telemedicine visit and agreed to proceed. Patient understands insurance  will be billed.   Past Medical History-  Patient Active Problem List   Diagnosis Date Noted  . History of transient ischemic attack (TIA) 06/02/2017    Priority: High  . 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 Chronic low back pain 12/20/2013    Priority: High  . Fibromyalgia 12/05/2007    Priority: High  . UTI (urinary tract infection) 12/15/2018    Priority: Medium  . CKD (chronic kidney disease), stage III ([place]) 06/02/2017    Priority: Medium  . Near syncope 03/11/2017    Priority: Medium  . History of small bowel obstruction 06/28/2011    Priority: Medium  . GERD with stricture 06/28/2011    Priority: Medium  . Polycystic kidney disease     Priority: Medium  . Hypertension     Priority: Medium  . Hyperlipidemia     Priority: Medium  . Osteoporosis 12/21/2009    Priority: Medium  . INSOMNIA, CHRONIC 10/08/2008    Priority: Medium  . Depression 02/08/2008    Priority: Medium  . Facet arthropathy 09/14/2019    Priority: Low  . Vaginal dryness 01/29/2019    Priority: Low  . Chronic right shoulder pain 03/07/2014    Priority: Low  . Benign essential tremor 10/09/2013    Priority: Low  . Hyperglycemia 06/27/2012    Priority: Low  . Diverticulitis large intestine 02/11/2012    Priority: Low  . Irritable bowel syndrome (IBS) 06/28/2011    Priority: Low  . IC (interstitial cystitis)     Priority: Low  . Hemorrhoids 10/08/2008    Priority:  Low  . 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 HEMATURIA UNSPECIFIED 07/04/2008    Priority: Low  . Lung nodules 08/09/2007    Priority: Low  . Memory loss 07/21/2020    Medications- reviewed and updated Current Outpatient Medications  Medication Sig Dispense Refill  . amLODipine (NORVASC) 2.5 MG tablet Take 0.5 tablets (1.25 mg total) by mouth in the morning and at bedtime. Always take 1/2 tablet before bed. Can take 1/2 tablet in AM if  blood pressure >165/100 90 tablet 3  . aspirin 81 MG tablet Take 1 tablet (81 mg total) by mouth daily. 30 tablet   . calcium-vitamin D 250-100 MG-UNIT tablet Take 1 tablet by mouth 2 (two) times daily.    Marland Kitchen denosumab (PROLIA) 60 MG/ML SOSY injection Inject 60 mg into the skin every 6 (six) months.    . DULoxetine (CYMBALTA) 30 MG capsule TAKE 1 CAPSULE BY MOUTH TWICE DAILY 90 capsule 3  . gabapentin (NEURONTIN) 300 MG capsule Take 1 capsule (300 mg total) by mouth 2 (two) times daily as needed (pain). TAKE 1 CAPSULE(300 MG) BY MOUTH TWICE DAILY AS NEEDED 180 capsule 3  . Multiple Vitamins-Minerals (MULTIVITAMIN WITH MINERALS) tablet Take 1 tablet by mouth daily.    Vladimir Faster Glycol-Propyl Glycol (SYSTANE) 0.4-0.3 % GEL ophthalmic gel Place 1 application into both eyes 3 (three) times daily as needed (dry eye).    . rosuvastatin (CRESTOR) 40 MG tablet Take 1 tablet (40 mg total) by mouth daily. 90 tablet 3  . traMADol (ULTRAM) 50 MG tablet Take 1 tablet (50 mg total) by mouth every 6 (six) hours as needed. 20 tablet 0  . zolpidem (AMBIEN) 5 MG tablet TAKE 1 TABLET BY MOUTH EVERY DAY AT BEDTIME FOR SLEEP 90 tablet 1  . famotidine (PEPCID) 20 MG tablet Take 1 tablet (20 mg total) by mouth 2 (two) times daily. 180 tablet 1  . LORazepam (ATIVAN) 0.5 MG tablet Take 1 tablet (0.5 mg total) by mouth daily as needed for anxiety or sedation (prior to MRI). (Patient not taking: Reported on 08/20/2020) 2 tablet 0   No current facility-administered medications for this visit.     Objective:  LMP 08/25/1992  self reported vitals  Nonlabored voice, normal speech      Assessment and Plan    # Covid 19 S: started with symptoms 08/13/20. Tested on 08/16/20 and was positive. Symptoms include fever initially up to one oh one but that has resolved, sore throat, runny nose, headache and dry cough  Amoxicillin for UTI was finishing up when this started   She is in [place] right now. Scheduled for antibody  infusion there.   A/P: Patient with testing confirming covid 19 with first day of covid 19 symptoms  Therefore: -She is fully vaccinated but with her pulmonary issues I do think monoclonal antibody infusion is reasonable-recommended she go through with this - recommended patient watch closely for shortness of breath or confusion or worsening symptoms and if those occur patient should contact us immediately or seek care in the emergency department -recommended patient consider purchasing pulse oximeter and if levels 94% or below persistently- seek care at the hospital - Patient needs to self isolate  for at least 10 days since first symptom AND at least 24 hours fever free without fever reducing medications AND have improvement in respiratory symptoms  - earliest possible day out of self isolation January 24th - she should inform close  contacts about exposure (anyone patient been around unmasked for more than 15 minutes)   Recommended follow up: As needed for acute concerns Future Appointments  Date Time Provider [place]  09/17/2020  2:30 PM [facility]    Lab/Order associations:   ICD-10-CM   1. COVID-19  U07.1     Meds ordered this encounter  Medications  . famotidine (PEPCID) 20 MG tablet    Sig: Take 1 tablet (20 mg total) by mouth 2 (two) times daily.    Dispense:  180 tablet    Refill:  1    One time refill while traveling- left meds at home in [place]   Return precautions advised.  Garret Reddish, MD

## 2020-08-19 NOTE — Patient Instructions (Signed)
Health Maintenance Due  Topic Date Due  . TETANUS/TDAP  12/22/2019  . COVID-19 Vaccine (3 - Booster for Pfizer series) 03/26/2020   Depression screen [facility] 2/9 05/15/2020 10/04/2019 08/01/2019  Decreased Interest 0 0 0  Down, Depressed, Hopeless 0 0 0  PHQ - 2 Score 0 0 0  Altered sleeping 0 0 -  Tired, decreased energy 1 3 -  Change in appetite 0 0 -  Feeling bad or failure about yourself  0 0 -  Trouble concentrating 1 0 -  Moving slowly or fidgety/restless 0 0 -  Suicidal thoughts 0 0 -  PHQ-9 Score 2 3 -  Difficult doing work/chores - Not difficult at all -  Some recent data might be hidden

## 2020-08-19 NOTE — Telephone Encounter (Signed)
Patient is scheduled for virtual tomorrow.

## 2020-08-20 ENCOUNTER — Other Ambulatory Visit: Payer: Self-pay

## 2020-08-20 ENCOUNTER — Encounter: Payer: Self-pay | Admitting: Family Medicine

## 2020-08-20 ENCOUNTER — Telehealth (INDEPENDENT_AMBULATORY_CARE_PROVIDER_SITE_OTHER): Payer: Medicare Other | Admitting: Family Medicine

## 2020-08-20 DIAGNOSIS — U071 COVID-19: Secondary | ICD-10-CM | POA: Diagnosis not present

## 2020-08-20 MED ORDER — FAMOTIDINE 20 MG PO TABS: 20.0000 mg | ORAL_TABLET | Freq: Two times a day (BID) | ORAL | 1 refills | Status: DC

## 2020-08-21 ENCOUNTER — Ambulatory Visit: Payer: Medicare Other

## 2020-09-17 ENCOUNTER — Other Ambulatory Visit: Payer: Self-pay | Admitting: Family Medicine

## 2020-09-17 ENCOUNTER — Ambulatory Visit: Payer: Medicare Other

## 2020-09-17 DIAGNOSIS — M545 Low back pain, unspecified: Secondary | ICD-10-CM | POA: Diagnosis not present

## 2020-09-17 DIAGNOSIS — M47816 Spondylosis without myelopathy or radiculopathy, lumbar region: Secondary | ICD-10-CM | POA: Diagnosis not present

## 2020-09-17 NOTE — Telephone Encounter (Signed)
PROLIA GIVEN 05/14/2020 NEXT INJECTION 10/13/2020

## 2020-09-21 ENCOUNTER — Other Ambulatory Visit: Payer: Self-pay | Admitting: Family Medicine

## 2020-09-24 DIAGNOSIS — M545 Low back pain, unspecified: Secondary | ICD-10-CM | POA: Diagnosis not present

## 2020-09-24 DIAGNOSIS — M4316 Spondylolisthesis, lumbar region: Secondary | ICD-10-CM | POA: Diagnosis not present

## 2020-09-24 DIAGNOSIS — M47816 Spondylosis without myelopathy or radiculopathy, lumbar region: Secondary | ICD-10-CM | POA: Diagnosis not present

## 2020-09-25 DIAGNOSIS — M4316 Spondylolisthesis, lumbar region: Secondary | ICD-10-CM | POA: Diagnosis not present

## 2020-09-25 DIAGNOSIS — M5416 Radiculopathy, lumbar region: Secondary | ICD-10-CM | POA: Diagnosis not present

## 2020-09-28 ENCOUNTER — Ambulatory Visit: Payer: Medicare Other

## 2020-10-19 DIAGNOSIS — M5416 Radiculopathy, lumbar region: Secondary | ICD-10-CM | POA: Diagnosis not present

## 2020-10-19 DIAGNOSIS — M4316 Spondylolisthesis, lumbar region: Secondary | ICD-10-CM | POA: Diagnosis not present

## 2020-10-23 DIAGNOSIS — M5416 Radiculopathy, lumbar region: Secondary | ICD-10-CM | POA: Diagnosis not present

## 2020-10-29 ENCOUNTER — Other Ambulatory Visit: Payer: Self-pay

## 2020-10-29 MED ORDER — ZOLPIDEM TARTRATE 5 MG PO TABS: ORAL_TABLET | ORAL | 1 refills | Status: DC

## 2020-10-29 NOTE — Telephone Encounter (Signed)
..   LAST APPOINTMENT DATE: 09/21/2020   NEXT APPOINTMENT DATE:@Visit  date not found  MEDICATION:zolpidem (AMBIEN) 5 MG tablet    PHARMACY:[id number] - [place], [place] - [address] AT [address]

## 2020-11-03 ENCOUNTER — Ambulatory Visit (INDEPENDENT_AMBULATORY_CARE_PROVIDER_SITE_OTHER): Payer: Medicare Other | Admitting: Family Medicine

## 2020-11-03 ENCOUNTER — Other Ambulatory Visit: Payer: Self-pay

## 2020-11-03 ENCOUNTER — Encounter: Payer: Self-pay | Admitting: Family Medicine

## 2020-11-03 VITALS — BP 138/86 | HR 77 | Temp 98.8°F | Ht 68.0 in | Wt 146.8 lb

## 2020-11-03 DIAGNOSIS — E785 Hyperlipidemia, unspecified: Secondary | ICD-10-CM | POA: Diagnosis not present

## 2020-11-03 DIAGNOSIS — R413 Other amnesia: Secondary | ICD-10-CM | POA: Diagnosis not present

## 2020-11-03 DIAGNOSIS — N281 Cyst of kidney, acquired: Secondary | ICD-10-CM

## 2020-11-03 DIAGNOSIS — I10 Essential (primary) hypertension: Secondary | ICD-10-CM | POA: Diagnosis not present

## 2020-11-03 DIAGNOSIS — G4700 Insomnia, unspecified: Secondary | ICD-10-CM

## 2020-11-03 LAB — COMPREHENSIVE METABOLIC PANEL
ALT: 24 U/L (ref 0–35)
AST: 21 U/L (ref 0–37)
Albumin: 3.7 g/dL (ref 3.5–5.2)
Alkaline Phosphatase: 62 U/L (ref 39–117)
BUN: 29 mg/dL — ABNORMAL HIGH (ref 6–23)
CO2: 29 mEq/L (ref 19–32)
Calcium: 9.7 mg/dL (ref 8.4–10.5)
Chloride: 104 mEq/L (ref 96–112)
Creatinine, Ser: 1.48 mg/dL — ABNORMAL HIGH (ref 0.40–1.20)
GFR: 33.71 mL/min — ABNORMAL LOW (ref >60.00)
Glucose, Bld: 90 mg/dL (ref 70–99)
Potassium: 4.4 mEq/L (ref 3.5–5.1)
Sodium: 142 mEq/L (ref 135–145)
Total Bilirubin: 0.3 mg/dL (ref 0.2–1.2)
Total Protein: 5.9 g/dL — ABNORMAL LOW (ref 6.0–8.3)

## 2020-11-03 LAB — LDL CHOLESTEROL, DIRECT: Direct LDL: 59 mg/dL

## 2020-11-03 MED ORDER — DULOXETINE HCL 30 MG PO CPEP: 30.0000 mg | ORAL_CAPSULE | Freq: Two times a day (BID) | ORAL | 3 refills | Status: DC

## 2020-11-03 NOTE — Addendum Note (Signed)
Addended by: [name] on: 11/03/2020 10:21 AM   Modules accepted: Orders

## 2020-11-03 NOTE — Progress Notes (Signed)
Phone [phone] In person visit   Subjective:   Sandra Craig is a 80 y.o. year old very pleasant female patient who presents for/with See problem oriented charting Chief Complaint  Patient presents with  . Hyperlipidemia  . Hypertension  . Depression  . Hyperglycemia  . Fibromyalgia  . Insomnia  . Memory Loss    This visit occurred during the SARS-CoV-2 public health emergency.  Safety protocols were in place, including screening questions prior to the visit, additional usage of staff PPE, and extensive cleaning of exam room while observing appropriate contact time as indicated for disinfecting solutions.   Past Medical History-  Patient Active Problem List   Diagnosis Date Noted  . History of transient ischemic attack (TIA) 06/02/2017    Priority: High  . 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 Chronic low back pain 12/20/2013    Priority: High  . Fibromyalgia 12/05/2007    Priority: High  . UTI (urinary tract infection) 12/15/2018    Priority: Medium  . CKD (chronic kidney disease), stage III ([place]) 06/02/2017    Priority: Medium  . Near syncope 03/11/2017    Priority: Medium  . History of small bowel obstruction 06/28/2011    Priority: Medium  . GERD with stricture 06/28/2011    Priority: Medium  . Polycystic kidney disease     Priority: Medium  . Hypertension     Priority: Medium  . Hyperlipidemia     Priority: Medium  . Osteoporosis 12/21/2009    Priority: Medium  . INSOMNIA, CHRONIC 10/08/2008    Priority: Medium  . Depression 02/08/2008    Priority: Medium  . Facet arthropathy 09/14/2019    Priority: Low  . Vaginal dryness 01/29/2019    Priority: Low  . Chronic right shoulder pain 03/07/2014    Priority: Low  . Benign essential tremor 10/09/2013    Priority: Low  . Hyperglycemia 06/27/2012    Priority: Low  . Diverticulitis large intestine 02/11/2012    Priority: Low  . Irritable bowel  syndrome (IBS) 06/28/2011    Priority: Low  . IC (interstitial cystitis)     Priority: Low  . Hemorrhoids 10/08/2008    Priority: Low  . 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 HEMATURIA UNSPECIFIED 07/04/2008    Priority: Low  . Lung nodules 08/09/2007    Priority: Low  . Memory loss 07/21/2020    Medications- reviewed and updated Current Outpatient Medications  Medication Sig Dispense Refill  . amLODipine (NORVASC) 2.5 MG tablet Take 0.5 tablets (1.25 mg total) by mouth in the morning and at bedtime. Always take 1/2 tablet before bed. Can take 1/2 tablet in AM if blood pressure >165/100 90 tablet 3  . aspirin 81 MG tablet Take 1 tablet (81 mg total) by mouth daily. 30 tablet   . calcium-vitamin D 250-100 MG-UNIT tablet Take 1 tablet by mouth 2 (two) times daily.    Marland Kitchen denosumab (PROLIA) 60 MG/ML SOSY injection Inject 60 mg into the skin every 6 (six) months.    . DULoxetine (CYMBALTA) 30 MG capsule TAKE 1 CAPSULE BY MOUTH TWICE DAILY 90 capsule 3  . famotidine (PEPCID) 20 MG tablet Take 1 tablet (20 mg total) by mouth 2 (two) times daily. 180 tablet 1  . gabapentin (NEURONTIN) 300 MG capsule Take 1 capsule (300 mg total) by mouth 2 (two) times daily as needed (pain). TAKE 1 CAPSULE(300 MG) BY MOUTH TWICE  DAILY AS NEEDED 180 capsule 3  . Multiple Vitamins-Minerals (MULTIVITAMIN WITH MINERALS) tablet Take 1 tablet by mouth daily.    Vladimir Faster Glycol-Propyl Glycol (SYSTANE) 0.4-0.3 % GEL ophthalmic gel Place 1 application into both eyes 3 (three) times daily as needed (dry eye).    . rosuvastatin (CRESTOR) 40 MG tablet Take 1 tablet (40 mg total) by mouth daily. 90 tablet 3  . traMADol (ULTRAM) 50 MG tablet Take 1 tablet (50 mg total) by mouth every 6 (six) hours as needed. 20 tablet 0  . zolpidem (AMBIEN) 5 MG tablet TAKE 1 TABLET BY MOUTH EVERY DAY AT BEDTIME FOR SLEEP 90 tablet 1   No current facility-administered medications for this visit.      Objective:  BP 138/86   Pulse 77   Temp 98.8 F (37.1 C) (Temporal)   Ht 5\' 8"  (1.727 m)   Wt 146 lb 12.8 oz (66.6 kg)   LMP 08/25/1992   SpO2 95%   BMI 22.32 kg/m  Gen: NAD, resting comfortably CV: RRR no murmurs rubs or gallops Lungs: CTAB no crackles, wheeze, rhonchi Ext: no edema Skin: warm, dry    Assessment and Plan   #social update- lost sister in 2nd half of 2021. Marriage has not been as good as she had hoped. Potentially could look at marriage counseling  #imaging update-  our team is working on getting MRI kidney and MRI brain set up   #hyperlipidemia/history of TIA S: Medication: Increase rosuvastatin to 40 mg last visit, on aspirin as well due to history of TIA Lab Results  Component Value Date   CHOL 160 07/21/2020   HDL 68 07/21/2020   LDLCALC 75 07/21/2020   LDLDIRECT 67.0 01/29/2019   TRIG 89 07/21/2020   CHOLHDL 2.4 07/21/2020   A/P: hopefully improved- update LDL  # Depression S: Medication:cymbalta 30mg  twice daily  Depression screen [facility] 2/9 11/03/2020 05/15/2020 10/04/2019  Decreased Interest 1 0 0  Down, Depressed, Hopeless 1 0 0  PHQ - 2 Score 2 0 0  Altered sleeping 1 0 0  Tired, decreased energy 2 1 3   Change in appetite 1 0 0  Feeling bad or failure about yourself  0 0 0  Trouble concentrating 1 1 0  Moving slowly or fidgety/restless 0 0 0  Suicidal thoughts 0 0 0  PHQ-9 Score 7 2 3   Difficult doing work/chores Somewhat difficult - Not difficult at all  Some recent data might be hidden  A/P: she feels like getting married was a mistake- enjoyed her independence and alone time. I think counseling would be helpful- gave info for [facility] -also losing sister within last year (also her prayer partner) has been tough  #hypertension S: medication: Amlodipine 1.25 mg before bed injection additional 1 to 5 mg if blood pressure is elevated in the daytime.  Significant issues with presyncope and even syncope in the past and we  have been very cautious Home readings #s: right around 140- some below some above. Had one situation in [place] even with just 1.25 mg where BP got low. Working to stay hydrated but doesn't always do the best BP Readings from Last 3 Encounters:  11/03/20 138/86  07/21/20 130/80  05/15/20 138/88  A/P: continue to push fluids and continue low dose amlodipine 1.25 mg- only uses extra 1.25 if above 150- I think that's reasonable and provides best balance  #Memory loss/insomnia-please see note 07/21/2020 -feels memory is slightly worse at this point -We opted to  come off of Ambien last visit. She tried to come off but decreased quality of life and was not able to do so. Still think this would be ideal to come off of- didn't tolerate trazodone -Encouraged her to complete MRI of the brain last visit- we will try to set up again today -Also discussed potential neurology appointment-had seen Dr. 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  Lab/Order associations:   ICD-10-CM   1. Hyperlipidemia, unspecified hyperlipidemia type  E78.5 Comprehensive metabolic panel    LDL cholesterol, direct  2. Memory loss  R41.3 Ambulatory referral to Neurology  3. Primary hypertension  I10   4. INSOMNIA, CHRONIC  G47.00    Return precautions advised.  Garret Reddish, MD

## 2020-11-03 NOTE — Patient Instructions (Addendum)
Health Maintenance Due  Topic Date Due  . TETANUS/TDAP Patient is aware. Please discuss.  12/22/2019   Please call [phone] to schedule a visit with [facility]  We will call you within two weeks about your referral to neurology. If you do not hear within 2 weeks, give [place] a call.   Please stop by lab before you go If you have mychart- we will send your results within 3 business days of [place] receiving them.  If you do not have mychart- we will call you about results within 5 business days of [place] receiving them.  *please also note that you will see labs on mychart as soon as they post. I will later go in and write notes on them- will say "notes from Dr. Yong Channel"    Recommended follow up: Return in about 3 months (around 02/02/2021).

## 2020-11-16 ENCOUNTER — Telehealth: Payer: Self-pay | Admitting: Family Medicine

## 2020-11-16 NOTE — Telephone Encounter (Signed)
Left message for patient to call back and schedule Medicare Annual Wellness Visit (AWV) either virtually OR in office.   Last AWV 08/01/19; please schedule at anytime with [facility] at [facility].  This should be a 45 minute visit.

## 2020-11-17 DIAGNOSIS — M4316 Spondylolisthesis, lumbar region: Secondary | ICD-10-CM | POA: Diagnosis not present

## 2020-11-17 DIAGNOSIS — M5416 Radiculopathy, lumbar region: Secondary | ICD-10-CM | POA: Diagnosis not present

## 2020-11-17 DIAGNOSIS — M47816 Spondylosis without myelopathy or radiculopathy, lumbar region: Secondary | ICD-10-CM | POA: Diagnosis not present

## 2020-11-22 ENCOUNTER — Ambulatory Visit
Admission: RE | Admit: 2020-11-22 | Discharge: 2020-11-22 | Disposition: A | Discharge status: Home / Self Care | Payer: Medicare Other | Source: Ambulatory Visit | Attending: Family Medicine | Admitting: Family Medicine

## 2020-11-22 ENCOUNTER — Other Ambulatory Visit: Payer: Self-pay

## 2020-11-22 ENCOUNTER — Other Ambulatory Visit: Payer: Medicare Other

## 2020-11-22 DIAGNOSIS — R413 Other amnesia: Secondary | ICD-10-CM

## 2020-11-22 DIAGNOSIS — H748X3 Other specified disorders of middle ear and mastoid, bilateral: Secondary | ICD-10-CM | POA: Diagnosis not present

## 2020-11-22 DIAGNOSIS — N281 Cyst of kidney, acquired: Secondary | ICD-10-CM | POA: Diagnosis not present

## 2020-11-22 DIAGNOSIS — Z9049 Acquired absence of other specified parts of digestive tract: Secondary | ICD-10-CM | POA: Diagnosis not present

## 2020-11-22 DIAGNOSIS — J3489 Other specified disorders of nose and nasal sinuses: Secondary | ICD-10-CM | POA: Diagnosis not present

## 2020-11-22 DIAGNOSIS — G9389 Other specified disorders of brain: Secondary | ICD-10-CM | POA: Diagnosis not present

## 2020-11-22 DIAGNOSIS — K7689 Other specified diseases of liver: Secondary | ICD-10-CM | POA: Diagnosis not present

## 2020-11-22 DIAGNOSIS — N2889 Other specified disorders of kidney and ureter: Secondary | ICD-10-CM | POA: Diagnosis not present

## 2020-11-23 DIAGNOSIS — M545 Low back pain, unspecified: Secondary | ICD-10-CM | POA: Diagnosis not present

## 2020-11-24 ENCOUNTER — Telehealth: Payer: Self-pay | Admitting: *Deleted

## 2020-11-24 NOTE — Telephone Encounter (Addendum)
Deductible N/A  OOP MAX $4200 ($130.72 Met)  Annual exam 12/17/2020  Calcium 9.7            Date 11/03/2020  Upcoming dental procedures   Prior Authorization needed approved 11/24/2020-12/24/2020 (per rep [facility] does not need a Prior auth referance 12/03/2020 [id number] is the reference # pertaining to this call  Pt estimated Cost $222   [place] AS ANNUAL    Coverage Details: For the primary Special Pharmacy option, [facility] will be subject to a 20% coinsurance up to a $4200 out of pocket max ($130.72 met). Administration will be covered at 100%. N

## 2020-12-01 DIAGNOSIS — M47816 Spondylosis without myelopathy or radiculopathy, lumbar region: Secondary | ICD-10-CM | POA: Diagnosis not present

## 2020-12-04 DIAGNOSIS — M47816 Spondylosis without myelopathy or radiculopathy, lumbar region: Secondary | ICD-10-CM | POA: Diagnosis not present

## 2020-12-08 NOTE — Telephone Encounter (Signed)
Pt states she will call me back when she wants to schedule annual exam

## 2020-12-10 ENCOUNTER — Ambulatory Visit: Payer: Medicare Other | Admitting: Family

## 2020-12-11 ENCOUNTER — Encounter: Payer: Self-pay | Admitting: Family Medicine

## 2020-12-11 ENCOUNTER — Other Ambulatory Visit: Payer: Self-pay

## 2020-12-11 ENCOUNTER — Ambulatory Visit (INDEPENDENT_AMBULATORY_CARE_PROVIDER_SITE_OTHER): Payer: Medicare Other | Admitting: Family Medicine

## 2020-12-11 VITALS — BP 136/90 | HR 81 | Temp 98.8°F | Ht 68.0 in | Wt 148.4 lb

## 2020-12-11 DIAGNOSIS — J029 Acute pharyngitis, unspecified: Secondary | ICD-10-CM

## 2020-12-11 DIAGNOSIS — J302 Other seasonal allergic rhinitis: Secondary | ICD-10-CM | POA: Diagnosis not present

## 2020-12-11 LAB — POCT RAPID STREP A (OFFICE): Rapid Strep A Screen: NEGATIVE

## 2020-12-11 NOTE — Progress Notes (Signed)
Subjective  CC:  Chief Complaint  Patient presents with  . Sore Throat    On going for a month. Has tried salt water. Says she saw white spots, realized maybe a week ago.  . Ear Pain    Both ears, itching   Same day acute visit; PCP not available. New pt to me. Chart reviewed.   HPI: Sandra Craig is a 80 y.o. female who presents to the office today to address the problems listed above in the chief complaint.  80 year old female presents due to sore throat for the last month.  She is concerned she may have strep.  Reports she had a sore throat illness that started about 4 weeks ago.  Thought it was a virus.  However symptoms persist.  Complains of constant sore throat with swallowing.  She looked in the back of her throat last week and noted white spots.  She denies fevers, chills, cough, shortness of breath, change in taste.  She also reports her tongue feels like it is on fire.  She admits to postnasal drainage, sneezing, mild rhinorrhea and her ears are itching.  She tried salt water gargles without complete relief.  She denies ear pain.  Of note, her husband was recently diagnosed with esophageal cancer and is starting treatment tomorrow.  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  She denies malaise, change in appetite or abdominal pain.  She is had no sick contacts.  She is tested herself for COVID multiple times and it was negative.  She is immunized.   Assessment  1. Sore throat   2. Seasonal allergic rhinitis, unspecified trigger      Plan   Sore throat: Reassured.  No signs or symptoms of infection noted.  Normal exam today.  Mild redness, sore throat could be coming from postnasal drainage.  Recommend starting allergy medicine like Zyrtec.  Continue salt water gargles.  Can gargle with baby aspirin as well if needed.  She can take Tylenol.  She cannot use NSAIDs due to kidney disease.   Due to her concerns, I feel it would be warranted to have her ENT doctor recheck her throat to ensure everything is stable.  She is understandably worried given her husband's recent problem with sore throat and been diagnosis of esophageal cancer.  Follow up:   02/09/2021  Orders Placed This Encounter  Procedures  . POCT rapid strep A   No orders of the defined types were placed in this encounter.     I reviewed the patients updated PMH, FH, and SocHx.    Patient Active Problem List   Diagnosis Date Noted  . Memory loss 07/21/2020  . Facet arthropathy 09/14/2019  . Vaginal dryness 01/29/2019  . UTI (urinary tract infection) 12/15/2018  . History of transient ischemic attack (TIA) 06/02/2017  . CKD (chronic kidney disease), stage III ([place]) 06/02/2017  . Near syncope 03/11/2017  . 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 Chronic right shoulder pain 03/07/2014  . Chronic low back pain 12/20/2013  . Benign essential tremor 10/09/2013  . Hyperglycemia 06/27/2012  . Diverticulitis large intestine 02/11/2012  . Irritable bowel syndrome (IBS) 06/28/2011  . History of small bowel obstruction 06/28/2011  . 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 Polycystic kidney disease   . Hypertension   . Hyperlipidemia   . IC (interstitial cystitis)   . Osteoporosis 12/21/2009  . INSOMNIA,  CHRONIC 10/08/2008  . Hemorrhoids 10/08/2008  . 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 HEMATURIA UNSPECIFIED 07/04/2008  . Depression 02/08/2008  . Fibromyalgia 12/05/2007  . Lung nodules 08/09/2007   Current Meds  Medication Sig  . amLODipine (NORVASC) 2.5 MG tablet Take 0.5 tablets (1.25 mg total) by mouth in the morning and at bedtime. Always take 1/2 tablet before bed. Can take 1/2 tablet in AM if blood pressure >165/100  . aspirin 81 MG tablet Take 1 tablet (81 mg total) by mouth daily.  . calcium-vitamin D 250-100 MG-UNIT tablet Take 1 tablet by mouth 2  (two) times daily.  Marland Kitchen denosumab (PROLIA) 60 MG/ML SOSY injection Inject 60 mg into the skin every 6 (six) months.  . DULoxetine (CYMBALTA) 30 MG capsule Take 1 capsule (30 mg total) by mouth 2 (two) times daily.  . famotidine (PEPCID) 20 MG tablet Take 1 tablet (20 mg total) by mouth 2 (two) times daily.  Marland Kitchen gabapentin (NEURONTIN) 300 MG capsule Take 1 capsule (300 mg total) by mouth 2 (two) times daily as needed (pain). TAKE 1 CAPSULE(300 MG) BY MOUTH TWICE DAILY AS NEEDED  . Multiple Vitamins-Minerals (MULTIVITAMIN WITH MINERALS) tablet Take 1 tablet by mouth daily.  Vladimir Faster Glycol-Propyl Glycol (SYSTANE) 0.4-0.3 % GEL ophthalmic gel Place 1 application into both eyes 3 (three) times daily as needed (dry eye).  . rosuvastatin (CRESTOR) 40 MG tablet Take 1 tablet (40 mg total) by mouth daily.  Marland Kitchen zolpidem (AMBIEN) 5 MG tablet TAKE 1 TABLET BY MOUTH EVERY DAY AT BEDTIME FOR SLEEP    Allergies: Patient is allergic to azithromycin and celecoxib. Family History: Patient family history includes COPD in her brother and father; Cancer in her brother; Colon cancer in her paternal grandmother; Emphysema in her mother; Heart attack in her paternal grandfather; Heart disease in her mother; Lymphoma in her maternal grandmother; Pancreatic cancer in her sister; Thyroid disease in her mother. Social History:  Patient  reports that she quit smoking about 44 years ago. Her smoking use included cigarettes. She has a 6.00 pack-year smoking history. She has never used smokeless tobacco. She reports that she does not drink alcohol and does not use drugs.  Review of Systems: Constitutional: Negative for fever malaise or anorexia Cardiovascular: negative for chest pain Respiratory: negative for SOB or persistent cough Gastrointestinal: negative for abdominal pain  Objective  Vitals: BP 136/90   Pulse 81   Temp 98.8 F (37.1 C) (Temporal)   Ht 5\' 8"  (1.727 m)   Wt 148 lb 6.4 oz (67.3 kg)   LMP 08/25/1992    SpO2 95%   BMI 22.56 kg/m  General: no acute distress , A&Ox3 HEENT: PEERL, conjunctiva normal, posterior pharynx is clear, no exudate, minimal erythema present.  No masses.  Midline uvula.  Right mildly tender lymph node in the anterior chain present neck is supple Cardiovascular:  RRR without murmur or gallop.  Respiratory:  Good breath sounds bilaterally, CTAB with normal respiratory effort Skin:  Warm, no rashes  Office Visit on 12/11/2020  Component Date Value Ref Range Status  . Rapid Strep A Screen 12/11/2020 Negative  Negative Final      Commons side effects, risks, benefits, and alternatives for medications and treatment plan prescribed today were discussed, and the patient expressed understanding of the given instructions. Patient is instructed to call or message via MyChart if he/she has any questions or concerns regarding our treatment plan. No barriers to understanding were identified. We discussed  Red Flag symptoms and signs in detail. Patient expressed understanding regarding what to do in case of urgent or emergency type symptoms.   Medication list was reconciled, printed and provided to the patient in AVS. Patient instructions and summary information was reviewed with the patient as documented in the AVS. This note was prepared with assistance of Dragon voice recognition software. Occasional wrong-word or sound-a-like substitutions may have occurred due to the inherent limitations of voice recognition software  This visit occurred during the SARS-CoV-2 public health emergency.  Safety protocols were in place, including screening questions prior to the visit, additional usage of staff PPE, and extensive cleaning of exam room while observing appropriate contact time as indicated for disinfecting solutions.

## 2020-12-11 NOTE — Patient Instructions (Signed)
Please follow up if symptoms do not improve or as needed.    Sore Throat A sore throat is pain, burning, irritation, or scratchiness in the throat. When you have a sore throat, you may feel pain or tenderness in your throat when you swallow or talk. Many things can cause a sore throat, including:  An infection.  Seasonal allergies.  Dryness in the air.  Irritants, such as smoke or pollution.  Radiation treatment to the area.  Gastroesophageal reflux disease (GERD).  A tumor. A sore throat is often the first sign of another sickness. It may happen with other symptoms, such as coughing, sneezing, fever, and swollen neck glands. Most sore throats go away without medical treatment. Follow these instructions at home:  Take over-the-counter medicines only as told by your health care provider. ? If your child has a sore throat, do not give your child aspirin because of the association with Reye syndrome.  Drink enough fluids to keep your urine pale yellow.  Rest as needed.  To help with pain, try: ? Sipping warm liquids, such as broth, herbal tea, or warm water. ? Eating or drinking cold or frozen liquids, such as frozen ice pops. ? Gargling with a salt-water mixture 3-4 times a day or as needed. To make a salt-water mixture, completely dissolve -1 tsp (3-6 g) of salt in 1 cup (237 mL) of warm water. ? Sucking on hard candy or throat lozenges. ? Putting a cool-mist humidifier in your bedroom at night to moisten the air. ? Sitting in the bathroom with the door closed for 5-10 minutes while you run hot water in the shower.  Do not use any products that contain nicotine or tobacco, such as cigarettes, e-cigarettes, and chewing tobacco. If you need help quitting, ask your health care provider.  Wash your hands well and often with soap and water. If soap and water are not available, use hand sanitizer.      Contact a health care provider if:  You have a fever for more than 2-3  days.  You have symptoms that last (are persistent) for more than 2-3 days.  Your throat does not get better within 7 days.  You have a fever and your symptoms suddenly get worse.  Your child who is 3 months to 12 years old has a temperature of 102.9F (39C) or higher. Get help right away if:  You have difficulty breathing.  You cannot swallow fluids, soft foods, or your saliva.  You have increased swelling in your throat or neck.  You have persistent nausea and vomiting. Summary  A sore throat is pain, burning, irritation, or scratchiness in the throat. Many things can cause a sore throat.  Take over-the-counter medicines only as told by your health care provider. Do not give your child aspirin.  Drink plenty of fluids, and rest as needed.  Contact a health care provider if your symptoms worsen or your sore throat does not get better within 7 days. This information is not intended to replace advice given to you by your health care provider. Make sure you discuss any questions you have with your health care provider. Document Revised: 12/18/2017 Document Reviewed: 12/18/2017 Elsevier Patient Education  2021 [employer].

## 2020-12-17 ENCOUNTER — Encounter: Payer: Self-pay | Admitting: Nurse Practitioner

## 2020-12-17 ENCOUNTER — Other Ambulatory Visit: Payer: Self-pay

## 2020-12-17 ENCOUNTER — Ambulatory Visit: Payer: Medicare Other | Admitting: Nurse Practitioner

## 2020-12-17 VITALS — BP 108/68 | HR 80 | Resp 18 | Ht 65.75 in | Wt 147.4 lb

## 2020-12-17 DIAGNOSIS — M810 Age-related osteoporosis without current pathological fracture: Secondary | ICD-10-CM

## 2020-12-17 DIAGNOSIS — Z01419 Encounter for gynecological examination (general) (routine) without abnormal findings: Secondary | ICD-10-CM | POA: Diagnosis not present

## 2020-12-17 DIAGNOSIS — Z780 Asymptomatic menopausal state: Secondary | ICD-10-CM | POA: Diagnosis not present

## 2020-12-17 MED ORDER — DENOSUMAB 60 MG/ML ~~LOC~~ SOSY
60.0000 mg | PREFILLED_SYRINGE | Freq: Once | SUBCUTANEOUS | Status: AC
  Administered 2020-12-17: 60 mg via SUBCUTANEOUS

## 2020-12-17 NOTE — Patient Instructions (Signed)
Health Maintenance After Age 79 After age 79, you are at a higher risk for certain long-term diseases and infections as well as injuries from falls. Falls are a major cause of broken bones and head injuries in people who are older than age 79. Getting regular preventive care can help to keep you healthy and well. Preventive care includes getting regular testing and making lifestyle changes as recommended by your health care provider. Talk with your health care provider about:  Which screenings and tests you should have. A screening is a test that checks for a disease when you have no symptoms.  A diet and exercise plan that is right for you. What should I know about screenings and tests to prevent falls? Screening and testing are the best ways to find a health problem early. Early diagnosis and treatment give you the best chance of managing medical conditions that are common after age 79. Certain conditions and lifestyle choices may make you more likely to have a fall. Your health care provider may recommend:  Regular vision checks. Poor vision and conditions such as cataracts can make you more likely to have a fall. If you wear glasses, make sure to get your prescription updated if your vision changes.  Medicine review. Work with your health care provider to regularly review all of the medicines you are taking, including over-the-counter medicines. Ask your health care provider about any side effects that may make you more likely to have a fall. Tell your health care provider if any medicines that you take make you feel dizzy or sleepy.  Osteoporosis screening. Osteoporosis is a condition that causes the bones to get weaker. This can make the bones weak and cause them to break more easily.  Blood pressure screening. Blood pressure changes and medicines to control blood pressure can make you feel dizzy.  Strength and balance checks. Your health care provider may recommend certain tests to check your  strength and balance while standing, walking, or changing positions.  Foot health exam. Foot pain and numbness, as well as not wearing proper footwear, can make you more likely to have a fall.  Depression screening. You may be more likely to have a fall if you have a fear of falling, feel emotionally low, or feel unable to do activities that you used to do.  Alcohol use screening. Using too much alcohol can affect your balance and may make you more likely to have a fall. What actions can I take to lower my risk of falls? General instructions  Talk with your health care provider about your risks for falling. Tell your health care provider if: ? You fall. Be sure to tell your health care provider about all falls, even ones that seem minor. ? You feel dizzy, sleepy, or off-balance.  Take over-the-counter and prescription medicines only as told by your health care provider. These include any supplements.  Eat a healthy diet and maintain a healthy weight. A healthy diet includes low-fat dairy products, low-fat (lean) meats, and fiber from whole grains, beans, and lots of fruits and vegetables. Home safety  Remove any tripping hazards, such as rugs, cords, and clutter.  Install safety equipment such as grab bars in bathrooms and safety rails on stairs.  Keep rooms and walkways well-lit. Activity  Follow a regular exercise program to stay fit. This will help you maintain your balance. Ask your health care provider what types of exercise are appropriate for you.  If you need a cane or walker,   use it as recommended by your health care provider.  Wear supportive shoes that have nonskid soles.   Lifestyle  Do not drink alcohol if your health care provider tells you not to drink.  If you drink alcohol, limit how much you have: ? 0-1 drink a day for women. ? 0-2 drinks a day for men.  Be aware of how much alcohol is in your drink. In the [place] one drink equals one typical bottle of beer (12  oz), one-half glass of wine (5 oz), or one shot of hard liquor (1 oz).  Do not use any products that contain nicotine or tobacco, such as cigarettes and e-cigarettes. If you need help quitting, ask your health care provider. Summary  Having a healthy lifestyle and getting preventive care can help to protect your health and wellness after age 79.  Screening and testing are the best way to find a health problem early and help you avoid having a fall. Early diagnosis and treatment give you the best chance for managing medical conditions that are more common for people who are older than age 79.  Falls are a major cause of broken bones and head injuries in people who are older than age 79. Take precautions to prevent a fall at home.  Work with your health care provider to learn what changes you can make to improve your health and wellness and to prevent falls. This information is not intended to replace advice given to you by your health care provider. Make sure you discuss any questions you have with your health care provider. Document Revised: 11/08/2018 Document Reviewed: 05/31/2017 Elsevier Patient Education  2021 [employer] Inc[employer]  

## 2020-12-17 NOTE — Progress Notes (Signed)
   Sandra Craig 1941/06/14 [id number]   History:  80 y.o. G1P0010 presents for breast and pelvic exam. 1994 [facility] BSO for endometriosis - on no HRT. Normal pap and mammogram history. Osteopenia with history of fractures - was on Reclast x 2 years, now on Prolia (stopped at one point and had a decline and restarted in 2019). Hernia repair 01/2020.   Gynecologic History Patient's last menstrual period was 08/25/1992. Period Cycle (Days):  (Hx Hysterectomy) Contraception: status post hysterectomy  Health Maintenance Last Pap: No longer screening per guidelines Last mammogram: 03/20/2020. Results were: normal Last colonoscopy/endoscopy: 09/2019. Results were: 10 polyps - 6 were pre-cancerous, hiatal hernia Last Dexa: 07/2018. Results were: T-score -2.4  Past medical history, past surgical history, family history and social history were all reviewed and documented in the EPIC chart. Married in October 2021. Husband just diagnosed with tonsillar cancer and will start radiation tomorrow.   ROS:  A ROS was performed and pertinent positives and negatives are included.  Exam:  Vitals:   12/17/20 1356  BP: 108/68  Pulse: 80  Resp: 18  Weight: 147 lb 6.4 oz (66.9 kg)  Height: 5' 5.75" (1.67 m)   Body mass index is 23.97 kg/m.  General appearance:  Normal Thyroid:  Symmetrical, normal in size, without palpable masses or nodularity. Respiratory  Auscultation:  Clear without wheezing or rhonchi Cardiovascular  Auscultation:  Regular rate, without rubs, murmurs or gallops  Edema/varicosities:  Not grossly evident Abdominal  Soft,nontender, without masses, guarding or rebound.  Liver/spleen:  No organomegaly noted  Hernia:  None appreciated  Skin  Inspection:  Grossly normal Breasts: Examined lying and sitting.   Right: Without masses, retractions, nipple discharge or axillary adenopathy.   Left: Without masses, retractions, nipple discharge or axillary adenopathy. Genitourinary    Inguinal/mons:  Normal without inguinal adenopathy  External genitalia:  Normal appearing vulva with no masses, tenderness, or lesions  BUS/Urethra/Skene's glands:  Normal  Vagina:  Normal appearing with normal color and discharge, no lesions. Atrophic changes  Cervix:  Absent  Uterus:  Absent  Adnexa/parametria:     Rt: Normal in size, without masses or tenderness.   Lt: Normal in size, without masses or tenderness.  Anus and perineum: Normal  Digital rectal exam: Normal sphincter tone without palpated masses or tenderness  Assessment/Plan:  80 y.o. G1P0010 for breast and pelvic exam.   Well female exam with routine gynecological exam - Education provided on SBEs, importance of preventative screenings, current guidelines, high calcium diet, regular exercise, and multivitamin daily. Labs with PCP.  Age-related osteoporosis without current pathological fracture - Plan: DG Bone Density. Was on Reclast x 2 years, now on Prolia (stopped at one point and had a decline and restarted in 2019). Recommend repeat bone density and she will schedule this soon. Prolia given today.   Postmenopausal - Plan: DG Bone Density. 1994 [facility] BSO for endometriosis. No HRT.  Screening for cervical cancer - Normal Pap history.  No longer screening per guidelines.  Screening for breast cancer - Normal mammogram history.  Continue annual screenings.  Normal breast exam today.  Screening for colon cancer - 09/2019 colonoscopy/endoscopy found 10 polyps - 6 were pre-cancerous, hiatal hernia. Will repeat at GI's recommended interval.   Return in 1 year for breast and pelvic exam.    Tamela Gammon DNP, 2:16 PM 12/17/2020

## 2020-12-21 DIAGNOSIS — M47816 Spondylosis without myelopathy or radiculopathy, lumbar region: Secondary | ICD-10-CM | POA: Diagnosis not present

## 2020-12-25 DIAGNOSIS — M47816 Spondylosis without myelopathy or radiculopathy, lumbar region: Secondary | ICD-10-CM | POA: Diagnosis not present

## 2020-12-29 ENCOUNTER — Encounter: Payer: Self-pay | Admitting: Family Medicine

## 2021-01-03 ENCOUNTER — Emergency Department (HOSPITAL_COMMUNITY): Payer: Medicare Other

## 2021-01-03 ENCOUNTER — Other Ambulatory Visit: Payer: Self-pay

## 2021-01-03 ENCOUNTER — Emergency Department (HOSPITAL_COMMUNITY)
Admission: EM | Admit: 2021-01-03 | Discharge: 2021-01-03 | Disposition: A | Discharge status: Home / Self Care | Payer: Medicare Other | Attending: Emergency Medicine | Admitting: Emergency Medicine

## 2021-01-03 ENCOUNTER — Encounter (HOSPITAL_COMMUNITY): Payer: Self-pay

## 2021-01-03 DIAGNOSIS — Z7982 Long term (current) use of aspirin: Secondary | ICD-10-CM | POA: Diagnosis not present

## 2021-01-03 DIAGNOSIS — N183 Chronic kidney disease, stage 3 unspecified: Secondary | ICD-10-CM | POA: Insufficient documentation

## 2021-01-03 DIAGNOSIS — I10 Essential (primary) hypertension: Secondary | ICD-10-CM | POA: Diagnosis not present

## 2021-01-03 DIAGNOSIS — R109 Unspecified abdominal pain: Secondary | ICD-10-CM | POA: Diagnosis not present

## 2021-01-03 DIAGNOSIS — I129 Hypertensive chronic kidney disease with stage 1 through stage 4 chronic kidney disease, or unspecified chronic kidney disease: Secondary | ICD-10-CM | POA: Diagnosis not present

## 2021-01-03 DIAGNOSIS — Z79899 Other long term (current) drug therapy: Secondary | ICD-10-CM | POA: Insufficient documentation

## 2021-01-03 DIAGNOSIS — R000 Tachycardia, unspecified: Secondary | ICD-10-CM | POA: Diagnosis not present

## 2021-01-03 DIAGNOSIS — Z96651 Presence of right artificial knee joint: Secondary | ICD-10-CM | POA: Insufficient documentation

## 2021-01-03 DIAGNOSIS — R110 Nausea: Secondary | ICD-10-CM | POA: Diagnosis not present

## 2021-01-03 DIAGNOSIS — R42 Dizziness and giddiness: Secondary | ICD-10-CM | POA: Diagnosis not present

## 2021-01-03 DIAGNOSIS — E860 Dehydration: Secondary | ICD-10-CM | POA: Diagnosis not present

## 2021-01-03 DIAGNOSIS — R9082 White matter disease, unspecified: Secondary | ICD-10-CM | POA: Diagnosis not present

## 2021-01-03 DIAGNOSIS — Z87891 Personal history of nicotine dependence: Secondary | ICD-10-CM | POA: Diagnosis not present

## 2021-01-03 DIAGNOSIS — Z96611 Presence of right artificial shoulder joint: Secondary | ICD-10-CM | POA: Insufficient documentation

## 2021-01-03 DIAGNOSIS — R29810 Facial weakness: Secondary | ICD-10-CM | POA: Diagnosis not present

## 2021-01-03 LAB — URINALYSIS, COMPLETE (UACMP) WITH MICROSCOPIC
Bacteria, UA: NONE SEEN
Bilirubin Urine: NEGATIVE
Glucose, UA: NEGATIVE mg/dL
Ketones, ur: NEGATIVE mg/dL
Leukocytes,Ua: NEGATIVE
Nitrite: NEGATIVE
Protein, ur: NEGATIVE mg/dL
Specific Gravity, Urine: 1.01 (ref 1.005–1.030)
Squamous Epithelial / HPF: NONE SEEN (ref 0–5)
WBC, UA: NONE SEEN WBC/hpf (ref 0–5)
pH: 7.5 (ref 5.0–8.0)

## 2021-01-03 LAB — COMPREHENSIVE METABOLIC PANEL
ALT: 43 U/L (ref 0–44)
AST: 41 U/L (ref 15–41)
Albumin: 3.4 g/dL — ABNORMAL LOW (ref 3.5–5.0)
Alkaline Phosphatase: 67 U/L (ref 38–126)
Anion gap: 7 (ref 5–15)
BUN: 26 mg/dL — ABNORMAL HIGH (ref 8–23)
CO2: 28 mmol/L (ref 22–32)
Calcium: 8.6 mg/dL — ABNORMAL LOW (ref 8.9–10.3)
Chloride: 104 mmol/L (ref 98–111)
Creatinine, Ser: 1.78 mg/dL — ABNORMAL HIGH (ref 0.44–1.00)
GFR, Estimated: 29 mL/min — ABNORMAL LOW (ref >60)
Glucose, Bld: 104 mg/dL — ABNORMAL HIGH (ref 70–99)
Potassium: 4.4 mmol/L (ref 3.5–5.1)
Sodium: 139 mmol/L (ref 135–145)
Total Bilirubin: 0.5 mg/dL (ref 0.3–1.2)
Total Protein: 6.1 g/dL — ABNORMAL LOW (ref 6.5–8.1)

## 2021-01-03 LAB — CBC
HCT: 38.7 % (ref 36.0–46.0)
Hemoglobin: 11.9 g/dL — ABNORMAL LOW (ref 12.0–15.0)
MCH: 29 pg (ref 26.0–34.0)
MCHC: 30.7 g/dL (ref 30.0–36.0)
MCV: 94.4 fL (ref 80.0–100.0)
Platelets: 156 10*3/uL (ref 150–400)
RBC: 4.1 MIL/uL (ref 3.87–5.11)
RDW: 14.2 % (ref 11.5–15.5)
WBC: 5.8 10*3/uL (ref 4.0–10.5)
nRBC: 0 % (ref 0.0–0.2)

## 2021-01-03 MED ORDER — LORAZEPAM 2 MG/ML IJ SOLN
INTRAMUSCULAR | Status: AC
  Administered 2021-01-03: 0.5 mg
  Filled 2021-01-03: qty 1

## 2021-01-03 MED ORDER — LORAZEPAM 2 MG/ML IJ SOLN: 0.5000 mg | Freq: Once | INTRAMUSCULAR | Status: AC

## 2021-01-03 NOTE — ED Notes (Signed)
ED Provider at bedside, Dr Kathrynn Humble

## 2021-01-03 NOTE — ED Triage Notes (Signed)
Orthostatic changes per fire department, has chronic kidney disease - not on dialysis, no urinary problems, chronic flank pain, has experienced the dizziness in the past

## 2021-01-03 NOTE — ED Notes (Signed)
Ambulated in hall without any difficulty, she does get lightheaded changing positions, explained to her the importance of changing positions slowly, a decrease sodium diet and hydration.  She and her husband both voice understanding

## 2021-01-03 NOTE — ED Notes (Signed)
Orthostatic vs, lying b/p 163/85 hr 79 Standing b/p 132/71 hr 94 MD notified

## 2021-01-03 NOTE — ED Notes (Signed)
Returned from MRI 

## 2021-01-03 NOTE — ED Provider Notes (Addendum)
[facility] EMERGENCY DEPARTMENT Provider Note   CSN: [id number] Arrival date & time: 01/03/21  0750     History Chief Complaint  Patient presents with  . Dizziness  . Nausea    Near syncope with dizziness and nausea, started last night around [place] is a 80 y.o. female.  HPI     80 year old female comes in with chief complaint of dizziness and nausea.  Patient has history of hypertension, hyperlipidemia, TIA.  She reports that yesterday evening, after she got up from dinner at a restaurant, she started having dizziness.  The dizziness lasted all the way until she got home.  It is described as feeling unsteady.  Symptoms were waxing and waning in intensity.  She was feeling better however, and went to bed, but woke up feeling the same.  Her symptoms are constantly present.  She feels like she might faint.  She denies any chest pain, shortness of breath, abdominal pain, UTI-like symptoms, new cough, fevers, chills.  She also denies any focal numbness, weakness, tingling, vision change.  Past Medical History:  Diagnosis Date  . Arthritis   . 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 Chronic insomnia   . Cystitis   . Depression   . Diverticulitis of intestine without perforation or abscess without bleeding    Patient did have abscess but noperforation  . Diverticulosis of colon (without mention of hemorrhage)   . Endometriosis   . Family history of malignant neoplasm of gastrointestinal tract   . Fibromyalgia   . Gastritis   . Hiatal hernia   . HIATAL HERNIA 10/08/2008   Qualifier: Diagnosis of  By: Nils Pyle CMA ([place]), Mearl Latin    . History of gallstones   . Hyperlipidemia   . Hypertension   . IBS (irritable bowel syndrome)   . IC (interstitial cystitis)   . Internal hemorrhoid   . Osteonecrosis ([place])   . Osteopenia   . Osteoporosis   . Palpitations   . Polycystic kidney disease   . Pulmonary nodule 12/08   5 mm Anterior  RUL  . Small bowel obstruction ([place])   . Stroke [facility])    TIA    Patient Active Problem List   Diagnosis Date Noted  . Memory loss 07/21/2020  . Facet arthropathy 09/14/2019  . Vaginal dryness 01/29/2019  . UTI (urinary tract infection) 12/15/2018  . History of transient ischemic attack (TIA) 06/02/2017  . CKD (chronic kidney disease), stage III ([place]) 06/02/2017  . Near syncope 03/11/2017  . 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 Chronic right shoulder pain 03/07/2014  . Chronic low back pain 12/20/2013  . Benign essential tremor 10/09/2013  . Hyperglycemia 06/27/2012  . Diverticulitis large intestine 02/11/2012  . Irritable bowel syndrome (IBS) 06/28/2011  . History of small bowel obstruction 06/28/2011  . 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 Polycystic kidney disease   . Hypertension   . Hyperlipidemia   . IC (interstitial cystitis)   . Osteoporosis 12/21/2009  . INSOMNIA, CHRONIC 10/08/2008  . Hemorrhoids 10/08/2008  . 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 HEMATURIA UNSPECIFIED 07/04/2008  . Depression 02/08/2008  . Fibromyalgia 12/05/2007  . Lung nodules 08/09/2007    Past Surgical History:  Procedure Laterality Date  . ABDOMINAL HYSTERECTOMY  1994   TAH,BSO FOR ENDOMETRIOSIS  . ABDOMINAL SURGERY  2011   small intestine blockage  . CHOLECYSTECTOMY    . GASTROPLASTY  2011  small bowel resection -open  . INGUINAL HERNIA REPAIR Right 02/28/2020   Procedure: LAPAROSCOPIC RIGHT INGUINAL HERNIA REPAIR WITH MESH;  Surgeon: Ralene Ok, MD;  Location: [facility];  Service: General;  Laterality: Right;  . JOINT REPLACEMENT  2008  . OOPHORECTOMY  1994   TAH,BSO  . PELVIC LAPAROSCOPY    . S/P right shoulder rotater cuff  200216/2011   Tear/adhesive capsulitis  . SBO Lap  11   Adhesions and small internal hernia  . SHOULDER HEMI-ARTHROPLASTY  06/05/2012   Procedure: SHOULDER HEMI-ARTHROPLASTY;  Surgeon: Johnny Bridge, MD;  Location: [facility];  Service: Orthopedics;  Laterality: Right;  FOR ARTHRITIS  . TOTAL HIP ARTHROPLASTY  FALL OF 2008   rt. partial hip replacement  . TOTAL SHOULDER ARTHROPLASTY  06/05/2012   Procedure: TOTAL SHOULDER ARTHROPLASTY;  Surgeon: Johnny Bridge, MD;  Location: [facility];  Service: Orthopedics;  Laterality: Right;  RIGHT SHOULDER TOTAL ARTHROPLASTY, HEMIARTHROPLASTY, SHOULDER, FOR ARTHRITIS  . VIDEO BRONCHOSCOPY Bilateral 01/28/2015   Procedure: VIDEO BRONCHOSCOPY WITH FLUORO;  Surgeon: Collene Gobble, MD;  Location: [facility];  Service: Cardiopulmonary;  Laterality: Bilateral;  . VIDEO BRONCHOSCOPY Bilateral 04/23/2019   Procedure: VIDEO BRONCHOSCOPY WITHOUT FLUORO;  Surgeon: Collene Gobble, MD;  Location: [facility] ENDOSCOPY;  Service: Cardiopulmonary;  Laterality: Bilateral;     OB History    Gravida  1   Para  0   Term      Preterm      AB  1   Living  0     SAB  1   IAB      Ectopic      Multiple      Live Births              Family History  Problem Relation Age of Onset  . Heart disease Mother        MI at age 5  . Emphysema Mother   . Thyroid disease Mother        Thyroidectomy/Benign  . Lymphoma Maternal Grandmother   . Colon cancer Paternal Grandmother   . COPD Father   . Cancer Brother        adenocarcinoma right lung  . COPD Brother   . Heart attack Paternal Grandfather   . Pancreatic cancer Sister        died 25  . Esophageal cancer Neg Hx   . Rectal cancer Neg Hx   . Stomach cancer Neg Hx     Social History   Tobacco Use  . Smoking status: Former Smoker    Packs/day: 0.75    Years: 8.00    Pack years: 6.00    Types: Cigarettes    Quit date: 08/01/1976    Years since quitting: 44.4  . Smokeless tobacco: Never Used  . Tobacco comment: Quit in 1978  Vaping Use  . Vaping Use: Never used  Substance Use Topics  . Alcohol use: No    Alcohol/week: 0.0 standard drinks  . Drug use: No    Home Medications Prior to  Admission medications   Medication Sig Start Date End Date Taking? Authorizing Provider  amLODipine (NORVASC) 2.5 MG tablet Take 0.5 tablets (1.25 mg total) by mouth in the morning and at bedtime. Always take 1/2 tablet before bed. Can take 1/2 tablet in AM if blood pressure >165/100 02/26/20   [name], MD  aspirin 81 MG tablet Take 1 tablet (81 mg total) by mouth daily. 01/23/18   McCue,  Janett Billow, NP  calcium-vitamin D 250-100 MG-UNIT tablet Take 1 tablet by mouth 2 (two) times daily.    [provider]  DULoxetine (CYMBALTA) 30 MG capsule Take 1 capsule (30 mg total) by mouth 2 (two) times daily. 11/03/20   [name], MD  famotidine (PEPCID) 20 MG tablet Take 1 tablet (20 mg total) by mouth 2 (two) times daily. 08/20/20   [name], MD  gabapentin (NEURONTIN) 300 MG capsule Take 1 capsule (300 mg total) by mouth 2 (two) times daily as needed (pain). TAKE 1 CAPSULE(300 MG) BY MOUTH TWICE DAILY AS NEEDED 08/05/20   [name], MD  Multiple Vitamins-Minerals (MULTIVITAMIN WITH MINERALS) tablet Take 1 tablet by mouth daily.    [provider]  Polyethyl Glycol-Propyl Glycol (SYSTANE) 0.4-0.3 % GEL ophthalmic gel Place 1 application into both eyes 3 (three) times daily as needed (dry eye).    [provider]  rosuvastatin (CRESTOR) 40 MG tablet Take 1 tablet (40 mg total) by mouth daily. 08/07/20   [name], MD  traMADol (ULTRAM) 50 MG tablet Take 1 tablet (50 mg total) by mouth every 6 (six) hours as needed. Patient not taking: Reported on 12/11/2020 02/28/20 02/27/21  Ralene Ok, MD  zolpidem (AMBIEN) 5 MG tablet TAKE 1 TABLET BY MOUTH EVERY DAY AT BEDTIME FOR SLEEP Patient taking differently: Take 5 mg by mouth at bedtime as needed for sleep. 10/29/20   [name], MD    Allergies    Azithromycin and Celecoxib  Review of Systems   Review of Systems  Constitutional: Positive for activity change.  Respiratory: Negative for  shortness of breath.   Cardiovascular: Negative for chest pain.  Genitourinary: Negative for dysuria.  Allergic/Immunologic: Negative for immunocompromised state.  Neurological: Positive for dizziness and light-headedness. Negative for seizures, speech difficulty, weakness, numbness and headaches.  Hematological: Does not bruise/bleed easily.  All other systems reviewed and are negative.   Physical Exam Updated Vital Signs BP (!) 148/78   Pulse 87   Temp 98.7 F (37.1 C) (Oral)   Resp 17   Ht 5' 5.75" (1.67 m)   Wt 66.8 kg   LMP 08/25/1992   SpO2 100%   BMI 23.95 kg/m   Physical Exam Vitals and nursing note reviewed.  Constitutional:      Appearance: She is well-developed.  HENT:     Head: Normocephalic and atraumatic.  Eyes:     Extraocular Movements: Extraocular movements intact.     Pupils: Pupils are equal, round, and reactive to light.  Cardiovascular:     Rate and Rhythm: Normal rate.  Pulmonary:     Effort: Pulmonary effort is normal.  Abdominal:     General: Bowel sounds are normal.  Musculoskeletal:     Cervical back: Normal range of motion and neck supple.  Skin:    General: Skin is warm and dry.  Neurological:     Mental Status: She is alert and oriented to person, place, and time.     Cranial Nerves: No cranial nerve deficit.     Sensory: No sensory deficit.     Motor: No weakness.     Coordination: Coordination normal.     ED Results / Procedures / Treatments   Labs (all labs ordered are listed, but only abnormal results are displayed) Labs Reviewed  CBC - Abnormal; Notable for the following components:      Result Value   Hemoglobin 11.9 (*)    All other components  within normal limits  COMPREHENSIVE METABOLIC PANEL - Abnormal; Notable for the following components:   Glucose, Bld 104 (*)    BUN 26 (*)    Creatinine, Ser 1.78 (*)    Calcium 8.6 (*)    Total Protein 6.1 (*)    Albumin 3.4 (*)    GFR, Estimated 29 (*)    All other  components within normal limits  URINALYSIS, COMPLETE (UACMP) WITH MICROSCOPIC    EKG EKG Interpretation  Date/Time:  Sunday January 03 2021 08:05:43 EDT Ventricular Rate:  84 PR Interval:  175 QRS Duration: 110 QT Interval:  389 QTC Calculation: 460 R Axis:   -10 Text Interpretation: Sinus rhythm Probable left ventricular hypertrophy Anterior Q waves, possibly due to LVH No acute changes No significant change since last tracing Confirmed by Varney Biles [phone]) on 01/03/2021 8:46:30 AM   Radiology CT HEAD WO CONTRAST  Result Date: 01/03/2021 CLINICAL DATA:  Dizziness, question stroke, history hypertension EXAM: CT HEAD WITHOUT CONTRAST TECHNIQUE: Contiguous axial images were obtained from the base of the skull through the vertex without intravenous contrast. Sagittal and coronal MPR images reconstructed from axial data set. COMPARISON:  06/02/2017 FINDINGS: Brain: Generalized atrophy. Normal ventricular morphology. No midline shift or mass effect. Mild small vessel chronic ischemic changes of deep cerebral white matter. No intracranial hemorrhage or evidence of acute infarction. Tiny fat attenuation focus at the anterior falx question tiny lipoma 5 mm diameter. No additional mass lesion. Vascular: No hyperdense vessels. Mild atherosclerotic calcification of RIGHT internal carotid artery at skull base Skull: Intact Sinuses/Orbits: Clear. [place] bullosa of the middle turbinates bilaterally. Other: N/A IMPRESSION: Atrophy with mild small vessel chronic ischemic changes of deep cerebral white matter. No acute intracranial abnormalities. Electronically Signed   By: Lavonia Dana M.D.   On: 01/03/2021 09:26   MR ANGIO HEAD WO CONTRAST  Result Date: 01/03/2021 CLINICAL DATA:  80 year old female with dizziness.  Hypertension. EXAM: MRI HEAD WITHOUT CONTRAST MRA HEAD WITHOUT CONTRAST MRA NECK WITHOUT CONTRAST TECHNIQUE: Multiplanar, multiecho pulse sequences of the brain and surrounding structures were  obtained without intravenous contrast. Angiographic images of the Circle of Willis were obtained using MRA technique without intravenous contrast. Angiographic images of the neck were obtained using MRA technique without intravenous contrast. Carotid stenosis measurements (when applicable) are obtained utilizing NASCET criteria, using the distal internal carotid diameter as the denominator. COMPARISON:  Head CT without contrast 0917 hours today. FINDINGS: Study is mildly degraded by motion artifact despite repeated imaging attempts. MRI HEAD FINDINGS Brain: No restricted diffusion to suggest acute infarction. No midline shift, mass effect, evidence of mass lesion, ventriculomegaly, extra-axial collection or acute intracranial hemorrhage. Cervicomedullary junction and pituitary are within normal limits. Patchy asymmetric white matter T2 and FLAIR hyperintensity maximal in the left corona radiata similar to the head CT earlier today. No cortical encephalomalacia. No chronic cerebral blood products. Dilated perivascular spaces in the deep gray matter nuclei. Brainstem and cerebellum appear negative. Vascular: Major intracranial vascular flow voids are preserved. Skull and upper cervical spine: Negative visible cervical spine. Visualized bone marrow signal is within normal limits. Sinuses/Orbits: Postoperative changes to both globes, otherwise negative. Other: Mild bilateral mastoid effusions. Negative visible nasopharynx. Grossly normal visible other internal auditory structures. Stylomastoid foramina appear normal. Visible scalp and face appear negative. MRA NECK FINDINGS Both axial and sagittal acquisition 3D time-of-flight neck MRA images. Three vessel arch configuration. Mildly tortuous proximal great vessels. Right CCA, right carotid bifurcation and cervical right ICA appear normal. Left CCA  bifurcates early. The left carotid bifurcation and cervical left ICA appear within normal limits. The left ICA is smaller  than the right throughout its course, see intracranial findings below. Proximal subclavian arteries and vertebral artery origins appear normal. Tortuous bilateral vertebral artery V1 segments. Fairly codominant vertebral arteries appear patent and within normal limits to the skull base. MRA HEAD FINDINGS Antegrade flow in the posterior circulation with codominant vertebral V4 segments. Normal vertebrobasilar junction and PICA origins. Patent basilar artery without stenosis. Normal SCA and PCA origins. Posterior communicating arteries are diminutive or absent. Bilateral PCA branches are within normal limits. Antegrade flow in both ICA siphons. The right siphon appears dominant and the left ICA terminus is hypoplastic owing to absent left ACA A1 segment (normal anatomic variations). No siphon stenosis. Ophthalmic artery origins appear normal. Normal right ICA terminus. Right ACA A1, anterior communicating artery, and visible bilateral ACA branches are within normal limits. MCA M1 segments and bi/trifurcations are patent without evidence of stenosis. Visible bilateral MCA branches are within normal limits. IMPRESSION: 1. No acute intracranial abnormality. 2. Mild to moderate for age cerebral white matter signal changes, nonspecific but most commonly due to chronic small vessel disease. 3. Negative Head and Neck MRA aside from tortuosity of the proximal great vessels. Electronically Signed   By: Genevie Ann M.D.   On: 01/03/2021 11:23   MR MRA NECK WO CONTRAST  Result Date: 01/03/2021 CLINICAL DATA:  80 year old female with dizziness.  Hypertension. EXAM: MRI HEAD WITHOUT CONTRAST MRA HEAD WITHOUT CONTRAST MRA NECK WITHOUT CONTRAST TECHNIQUE: Multiplanar, multiecho pulse sequences of the brain and surrounding structures were obtained without intravenous contrast. Angiographic images of the Circle of Willis were obtained using MRA technique without intravenous contrast. Angiographic images of the neck were obtained using  MRA technique without intravenous contrast. Carotid stenosis measurements (when applicable) are obtained utilizing NASCET criteria, using the distal internal carotid diameter as the denominator. COMPARISON:  Head CT without contrast 0917 hours today. FINDINGS: Study is mildly degraded by motion artifact despite repeated imaging attempts. MRI HEAD FINDINGS Brain: No restricted diffusion to suggest acute infarction. No midline shift, mass effect, evidence of mass lesion, ventriculomegaly, extra-axial collection or acute intracranial hemorrhage. Cervicomedullary junction and pituitary are within normal limits. Patchy asymmetric white matter T2 and FLAIR hyperintensity maximal in the left corona radiata similar to the head CT earlier today. No cortical encephalomalacia. No chronic cerebral blood products. Dilated perivascular spaces in the deep gray matter nuclei. Brainstem and cerebellum appear negative. Vascular: Major intracranial vascular flow voids are preserved. Skull and upper cervical spine: Negative visible cervical spine. Visualized bone marrow signal is within normal limits. Sinuses/Orbits: Postoperative changes to both globes, otherwise negative. Other: Mild bilateral mastoid effusions. Negative visible nasopharynx. Grossly normal visible other internal auditory structures. Stylomastoid foramina appear normal. Visible scalp and face appear negative. MRA NECK FINDINGS Both axial and sagittal acquisition 3D time-of-flight neck MRA images. Three vessel arch configuration. Mildly tortuous proximal great vessels. Right CCA, right carotid bifurcation and cervical right ICA appear normal. Left CCA bifurcates early. The left carotid bifurcation and cervical left ICA appear within normal limits. The left ICA is smaller than the right throughout its course, see intracranial findings below. Proximal subclavian arteries and vertebral artery origins appear normal. Tortuous bilateral vertebral artery V1 segments. Fairly  codominant vertebral arteries appear patent and within normal limits to the skull base. MRA HEAD FINDINGS Antegrade flow in the posterior circulation with codominant vertebral V4 segments. Normal vertebrobasilar junction and PICA origins.  Patent basilar artery without stenosis. Normal SCA and PCA origins. Posterior communicating arteries are diminutive or absent. Bilateral PCA branches are within normal limits. Antegrade flow in both ICA siphons. The right siphon appears dominant and the left ICA terminus is hypoplastic owing to absent left ACA A1 segment (normal anatomic variations). No siphon stenosis. Ophthalmic artery origins appear normal. Normal right ICA terminus. Right ACA A1, anterior communicating artery, and visible bilateral ACA branches are within normal limits. MCA M1 segments and bi/trifurcations are patent without evidence of stenosis. Visible bilateral MCA branches are within normal limits. IMPRESSION: 1. No acute intracranial abnormality. 2. Mild to moderate for age cerebral white matter signal changes, nonspecific but most commonly due to chronic small vessel disease. 3. Negative Head and Neck MRA aside from tortuosity of the proximal great vessels. Electronically Signed   By: Genevie Ann M.D.   On: 01/03/2021 11:23   MR BRAIN WO CONTRAST  Result Date: 01/03/2021 CLINICAL DATA:  80 year old female with dizziness.  Hypertension. EXAM: MRI HEAD WITHOUT CONTRAST MRA HEAD WITHOUT CONTRAST MRA NECK WITHOUT CONTRAST TECHNIQUE: Multiplanar, multiecho pulse sequences of the brain and surrounding structures were obtained without intravenous contrast. Angiographic images of the Circle of Willis were obtained using MRA technique without intravenous contrast. Angiographic images of the neck were obtained using MRA technique without intravenous contrast. Carotid stenosis measurements (when applicable) are obtained utilizing NASCET criteria, using the distal internal carotid diameter as the denominator.  COMPARISON:  Head CT without contrast 0917 hours today. FINDINGS: Study is mildly degraded by motion artifact despite repeated imaging attempts. MRI HEAD FINDINGS Brain: No restricted diffusion to suggest acute infarction. No midline shift, mass effect, evidence of mass lesion, ventriculomegaly, extra-axial collection or acute intracranial hemorrhage. Cervicomedullary junction and pituitary are within normal limits. Patchy asymmetric white matter T2 and FLAIR hyperintensity maximal in the left corona radiata similar to the head CT earlier today. No cortical encephalomalacia. No chronic cerebral blood products. Dilated perivascular spaces in the deep gray matter nuclei. Brainstem and cerebellum appear negative. Vascular: Major intracranial vascular flow voids are preserved. Skull and upper cervical spine: Negative visible cervical spine. Visualized bone marrow signal is within normal limits. Sinuses/Orbits: Postoperative changes to both globes, otherwise negative. Other: Mild bilateral mastoid effusions. Negative visible nasopharynx. Grossly normal visible other internal auditory structures. Stylomastoid foramina appear normal. Visible scalp and face appear negative. MRA NECK FINDINGS Both axial and sagittal acquisition 3D time-of-flight neck MRA images. Three vessel arch configuration. Mildly tortuous proximal great vessels. Right CCA, right carotid bifurcation and cervical right ICA appear normal. Left CCA bifurcates early. The left carotid bifurcation and cervical left ICA appear within normal limits. The left ICA is smaller than the right throughout its course, see intracranial findings below. Proximal subclavian arteries and vertebral artery origins appear normal. Tortuous bilateral vertebral artery V1 segments. Fairly codominant vertebral arteries appear patent and within normal limits to the skull base. MRA HEAD FINDINGS Antegrade flow in the posterior circulation with codominant vertebral V4 segments. Normal  vertebrobasilar junction and PICA origins. Patent basilar artery without stenosis. Normal SCA and PCA origins. Posterior communicating arteries are diminutive or absent. Bilateral PCA branches are within normal limits. Antegrade flow in both ICA siphons. The right siphon appears dominant and the left ICA terminus is hypoplastic owing to absent left ACA A1 segment (normal anatomic variations). No siphon stenosis. Ophthalmic artery origins appear normal. Normal right ICA terminus. Right ACA A1, anterior communicating artery, and visible bilateral ACA branches are within normal limits. MCA  M1 segments and bi/trifurcations are patent without evidence of stenosis. Visible bilateral MCA branches are within normal limits. IMPRESSION: 1. No acute intracranial abnormality. 2. Mild to moderate for age cerebral white matter signal changes, nonspecific but most commonly due to chronic small vessel disease. 3. Negative Head and Neck MRA aside from tortuosity of the proximal great vessels. Electronically Signed   By: Genevie Ann M.D.   On: 01/03/2021 11:23    Procedures Procedures   Medications Ordered in ED Medications  LORazepam (ATIVAN) injection 0.5 mg (0.5 mg Intravenous Given 01/03/21 0940)    ED Course  I have reviewed the triage vital signs and the nursing notes.  Pertinent labs & imaging results that were available during my care of the patient were reviewed by me and considered in my medical decision making (see chart for details).    MDM Rules/Calculators/A&P                          80 year old female comes in a chief complaint of dizziness.  She was last normal last night, around dinnertime.  It appears that her dizziness has been persistent, waxing and waning in intensity and there is associated near fainting with it.  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  Patient states that she is under a lot of stress.  She is unsure about dehydration, but not having any vomiting or diarrhea.  In addition  to dehydration and orthostasis, the other possibilities include stroke.  Polypharmacy could contribute, however patient is not on any new medications.  Infectious etiology such as COVID-19, UTI also in the differential.   1:14 PM UA pending at this time.  MRI results are negative for any acute findings. Patient has been ambulated, she was steady.  Results have been discussed with her.  She will follow-up with her PCP.  Her orthostasis, did have drop in SBP by 20, elevation of heart rate by 20.  Patient has received final cc per EMS.  She swears that she hydrates well.  Clinically, she does not appear dehydrated and her BUN/creatinine is stable.  Anticipate discharge if her UA looks normal.  3:26 PM The patient appears reasonably screened and/or stabilized for discharge and I doubt any other medical condition or other [facility] requiring further screening, evaluation, or treatment in the ED at this time prior to discharge.   Results from the ER workup discussed with the patient face to face and all questions answered to the best of my ability. The patient is safe for discharge with strict return precautions.   Final Clinical Impression(s) / ED Diagnoses Final diagnoses:  Dizziness  Dehydration    Rx / DC Orders ED Discharge Orders    None          Varney Biles, MD 01/03/21 1526

## 2021-01-03 NOTE — ED Notes (Signed)
I spoke with lab regarding UA results, lab states they are running UA and should have results within 15 minutes, pt and MD aware

## 2021-01-03 NOTE — Discharge Instructions (Addendum)
We saw you in the ER for dizziness.  All the results in the ER are normal, labs and imaging.  MRI is not showing any stroke.  There is no blockages in your blood vessels going to the brain.  We are not sure what is causing your symptoms. The workup in the ER is not complete, and is limited to screening for life threatening and emergent conditions only, so please see a primary care doctor for further evaluation.

## 2021-01-07 ENCOUNTER — Ambulatory Visit (INDEPENDENT_AMBULATORY_CARE_PROVIDER_SITE_OTHER): Payer: Medicare Other | Admitting: Otolaryngology

## 2021-01-11 DIAGNOSIS — M19072 Primary osteoarthritis, left ankle and foot: Secondary | ICD-10-CM | POA: Diagnosis not present

## 2021-01-11 DIAGNOSIS — M47816 Spondylosis without myelopathy or radiculopathy, lumbar region: Secondary | ICD-10-CM | POA: Diagnosis not present

## 2021-01-25 ENCOUNTER — Ambulatory Visit: Payer: Medicare Other | Admitting: Neurology

## 2021-01-29 DEATH — deceased

## 2021-02-08 DIAGNOSIS — M47816 Spondylosis without myelopathy or radiculopathy, lumbar region: Secondary | ICD-10-CM | POA: Diagnosis not present

## 2021-02-09 ENCOUNTER — Ambulatory Visit (INDEPENDENT_AMBULATORY_CARE_PROVIDER_SITE_OTHER): Payer: Medicare Other | Admitting: Family Medicine

## 2021-02-09 ENCOUNTER — Encounter: Payer: Self-pay | Admitting: Family Medicine

## 2021-02-09 ENCOUNTER — Other Ambulatory Visit: Payer: Self-pay

## 2021-02-09 VITALS — BP 122/80 | HR 71 | Temp 98.3°F | Ht 65.75 in | Wt 147.0 lb

## 2021-02-09 DIAGNOSIS — N183 Chronic kidney disease, stage 3 unspecified: Secondary | ICD-10-CM | POA: Diagnosis not present

## 2021-02-09 DIAGNOSIS — D692 Other nonthrombocytopenic purpura: Secondary | ICD-10-CM

## 2021-02-09 DIAGNOSIS — I10 Essential (primary) hypertension: Secondary | ICD-10-CM

## 2021-02-09 DIAGNOSIS — R42 Dizziness and giddiness: Secondary | ICD-10-CM

## 2021-02-09 DIAGNOSIS — R739 Hyperglycemia, unspecified: Secondary | ICD-10-CM | POA: Diagnosis not present

## 2021-02-09 LAB — CBC WITH DIFFERENTIAL/PLATELET
Basophils Absolute: 0 10*3/uL (ref 0.0–0.1)
Basophils Relative: 0.5 % (ref 0.0–3.0)
Eosinophils Absolute: 0.1 10*3/uL (ref 0.0–0.7)
Eosinophils Relative: 1.8 % (ref 0.0–5.0)
HCT: 36.6 % (ref 36.0–46.0)
Hemoglobin: 12.2 g/dL (ref 12.0–15.0)
Lymphocytes Relative: 27.1 % (ref 12.0–46.0)
Lymphs Abs: 1.6 10*3/uL (ref 0.7–4.0)
MCHC: 33.3 g/dL (ref 30.0–36.0)
MCV: 87.6 fl (ref 78.0–100.0)
Monocytes Absolute: 0.5 10*3/uL (ref 0.1–1.0)
Monocytes Relative: 7.7 % (ref 3.0–12.0)
Neutro Abs: 3.8 10*3/uL (ref 1.4–7.7)
Neutrophils Relative %: 62.9 % (ref 43.0–77.0)
Platelets: 180 10*3/uL (ref 150.0–400.0)
RBC: 4.18 Mil/uL (ref 3.87–5.11)
RDW: 14.8 % (ref 11.5–15.5)
WBC: 6.1 10*3/uL (ref 4.0–10.5)

## 2021-02-09 LAB — COMPREHENSIVE METABOLIC PANEL
ALT: 42 U/L — ABNORMAL HIGH (ref 0–35)
AST: 39 U/L — ABNORMAL HIGH (ref 0–37)
Albumin: 4.1 g/dL (ref 3.5–5.2)
Alkaline Phosphatase: 50 U/L (ref 39–117)
BUN: 29 mg/dL — ABNORMAL HIGH (ref 6–23)
CO2: 31 mEq/L (ref 19–32)
Calcium: 9.5 mg/dL (ref 8.4–10.5)
Chloride: 103 mEq/L (ref 96–112)
Creatinine, Ser: 1.64 mg/dL — ABNORMAL HIGH (ref 0.40–1.20)
GFR: 29.75 mL/min — ABNORMAL LOW (ref >60.00)
Glucose, Bld: 88 mg/dL (ref 70–99)
Potassium: 4.8 mEq/L (ref 3.5–5.1)
Sodium: 140 mEq/L (ref 135–145)
Total Bilirubin: 0.5 mg/dL (ref 0.2–1.2)
Total Protein: 6.7 g/dL (ref 6.0–8.3)

## 2021-02-09 LAB — HEMOGLOBIN A1C: Hgb A1c MFr Bld: 5.9 % (ref 4.6–6.5)

## 2021-02-09 NOTE — Patient Instructions (Addendum)
Please stop by lab before you go If you have mychart- we will send your results within 3 business days of [place] receiving them.  If you do not have mychart- we will call you about results within 5 business days of [place] receiving them.  *please also note that you will see labs on mychart as soon as they post. I will later go in and write notes on them- will say "notes from Dr. Yong Channel"  Please try to to stay hydrated and continue intermittent Gatorade intake.  3 month follow up

## 2021-02-09 NOTE — Progress Notes (Signed)
Phone [phone] In person visit   Subjective:   Sandra Craig is a 80 y.o. year old very pleasant female patient who presents for/with See problem oriented charting Chief Complaint  Patient presents with   Hospitalization Follow-up   Hypertension   This visit occurred during the SARS-CoV-2 public health emergency.  Safety protocols were in place, including screening questions prior to the visit, additional usage of staff PPE, and extensive cleaning of exam room while observing appropriate contact time as indicated for disinfecting solutions.   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    Priority: High   Chronic low back pain 12/20/2013    Priority: High   Fibromyalgia 12/05/2007    Priority: High   UTI (urinary tract infection) 12/15/2018    Priority: Medium   CKD (chronic kidney disease), stage III ([place]) 06/02/2017    Priority: Medium   Near syncope 03/11/2017    Priority: Medium   History of small bowel obstruction 06/28/2011    Priority: Medium   GERD with stricture 06/28/2011    Priority: Medium   Polycystic kidney disease     Priority: Medium   Hypertension     Priority: Medium   Hyperlipidemia     Priority: Medium   Osteoporosis 12/21/2009    Priority: Medium   INSOMNIA, CHRONIC 10/08/2008    Priority: Medium   Depression 02/08/2008    Priority: Medium   Facet arthropathy 09/14/2019    Priority: Low   Vaginal dryness 01/29/2019    Priority: Low   Chronic right shoulder pain 03/07/2014    Priority: Low   Benign essential tremor 10/09/2013    Priority: Low   Hyperglycemia 06/27/2012    Priority: Low   Diverticulitis large intestine 02/11/2012    Priority: Low   Irritable bowel syndrome (IBS) 06/28/2011    Priority: Low   IC (interstitial cystitis)     Priority: Low    Hemorrhoids 10/08/2008    Priority: Low   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 Always take 1/2 tablet before bed. Can take 1/2 tablet in AM if blood pressure >165/100 90 tablet 3   aspirin 81 MG tablet Take 1 tablet (81 mg total) by mouth daily. 30 tablet    calcium-vitamin D 250-100 MG-UNIT tablet Take 1 tablet by mouth 2 (two) times daily.     DULoxetine (CYMBALTA) 30 MG capsule Take 1 capsule (30 mg total) by mouth 2 (two) times daily. 180 capsule 3   famotidine (PEPCID) 20 MG tablet Take 1 tablet (20 mg total) by mouth 2 (two) times daily. 180 tablet 1   gabapentin (NEURONTIN) 300 MG capsule Take 1 capsule (300 mg total) by mouth 2 (two) times daily as needed (pain). TAKE 1 CAPSULE(300 MG) BY MOUTH TWICE DAILY AS NEEDED 180 capsule 3   Multiple Vitamins-Minerals (MULTIVITAMIN WITH MINERALS) tablet Take 1 tablet by mouth daily.     Polyethyl  Glycol-Propyl Glycol (SYSTANE) 0.4-0.3 % GEL ophthalmic gel Place 1 application into both eyes 3 (three) times daily as needed (dry eye).     rosuvastatin (CRESTOR) 40 MG tablet Take 1 tablet (40 mg total) by mouth daily. 90 tablet 3   traMADol (ULTRAM) 50 MG tablet Take 1 tablet (50 mg total) by mouth every 6 (six) hours as needed. 20 tablet 0   zolpidem (AMBIEN) 5 MG tablet TAKE 1 TABLET BY MOUTH EVERY DAY AT BEDTIME FOR SLEEP (Patient taking differently: Take 5 mg by mouth at bedtime as needed for sleep.) 90 tablet 1   No current facility-administered medications for this visit.     Objective:  BP 122/80 (BP Location: Left Arm, Patient Position:  Sitting, Cuff Size: Normal)   Pulse 71   Temp 98.3 F (36.8 C) (Temporal)   Ht 5' 5.75" (1.67 m)   Wt 147 lb (66.7 kg)   LMP 08/25/1992   SpO2 96%   BMI 23.91 kg/m  Gen: NAD, resting comfortably CV: RRR no murmurs rubs or gallops Lungs: CTAB no crackles, wheeze, rhonchi Abdomen: soft/nontender/nondistended/normal bowel sounds.  Ext: no edema Skin: warm, dry Neuro: grossly normal, moves all extremities, no lightheadedness with standing today     Assessment and Plan   #social update- lost sister in 2nd half of 2021. Marriage has still not been as good as hoped plus he was diagnosed with throat cancer  #imaging update -thankfully MR brain (done for memory loss) was completed on 11/22/20 as well ad MR abdomen- no further follow up from these required -did radiofrequency ablation with murphy/wainer yesterday- doing well today- prior good success on right side, did left yesterday  # Hospital F/U for Dizziness/Dehydration S:Patient presented to the hospital 01/03/21 with a cc of dizziness and nausea. Denied chest pain, SOB, abdominal pain, UTI-like symptoms, new cough, fevers, chills, focal numbness, weakness, tingling, or vision change.   She reported on the evening prior to the visit, she got up from dinner at a restaurant and started to experience dizziness- lasted all the way until she was at home. Described as feeling unsteady- symptoms were waxing and waning in intensity. She did feel better eventually and went to bed. When she woke up, the symptoms recurred. Patient reported symptoms were constantly present. Reported having a fainting sensation - she did state she had been under a lot of stress- was unsure about dehydration  and was not experiencing any vomiting or diarrhea  CBC was abnormal with hemoglobin of 11.9. CMP was abnormal with glucose at 104, BUN at 26, creatinine at 1.78which was above baseline, calcium at 8.6, total protein at 6.1, albumin at 3.4, and GFR at 29- potential  signs of malnutrition/poor PO- though BUN/cr ratio was not high  CT HEAD WO CONTRAST found generalized atrophy with mild small vessel chronic ischemic changes of deep cerebral whit matter, tiny fat attenuation focus at the anterior falx-question tiny lipoma 5 mm diameter; otherwise normal.   MR ANGIO HEAD WO CONTRAST/MR angio neck/ MR brain -  found that the brain had patchy asymmetric white matter T2 and FLAIR hyperintensity maximal in the left corona radiata similar to the head CT, Dilated perivascular spaces in the deep gray matter nuclei, sinuses/orbits showed postoperative changes to both globes, otherwise negative. Overall, MRI/A results were found negative for any acute findings.  As far as her dehydration and orthostasis, stroke was considered as a possibility. - orthostasis did have a drop in SBP by 20, elevation of heart rate by  20.  Patient's symptoms improved with hydration. She reports at home systolic under 60 and HR over 100.   She reports had similar accident in [place]- 4th episode total in last 2 years. States blood pressure was not running low at that time (other than orthostasis). She started doing some gatorade A/P: dizziness/orthostatic symptoms- we considered stopping amlodipine but some concern this could cause very high BP as she has had in the past- we opted to continue- focus on staying well hydrated and can do some intermittent gatorade. She did have some worsening kidney function and mild anemia in hospital- we will recheck those wit labs today   #hyperlipidemia #history of TIA S: Medication: rosuvastatin 40 mg- on aspirin as well due to history of TIA Lab Results  Component Value Date   CHOL 160 07/21/2020   HDL 68 07/21/2020   LDLCALC 75 07/21/2020   LDLDIRECT 59.0 11/03/2020   TRIG 89 07/21/2020   CHOLHDL 2.4 07/21/2020   A/P: LDL looked great last check under 70- continue current meds. No obvious TIA recurrence- continue aspirin  #senile purpura noted on  aspirin- stable- continue to monitor- check CBC   # Depression S: Medication: Cymbalta 30 mg twice daily  Depression screen [facility] 2/9 02/09/2021 11/03/2020 05/15/2020  Decreased Interest 1 1 0  Down, Depressed, Hopeless 1 1 0  PHQ - 2 Score 2 2 0  Altered sleeping 0 1 0  Tired, decreased energy 1 2 1   Change in appetite 1 1 0  Feeling bad or failure about yourself  0 0 0  Trouble concentrating 1 1 1   Moving slowly or fidgety/restless 0 0 0  Suicidal thoughts 0 0 0  PHQ-9 Score 5 7 2   Difficult doing work/chores Somewhat difficult Somewhat difficult -  Some recent data might be hidden  A/P: reasonable control especially with husbands throat cancer- continue to monitor  #hypertension S: medication: Amlodipine 1.25 mg before bed injection, additional 1 to 5 mg if blood pressure is elevated in the daytime. - recommended to uses extra 1.25  if above 150- best for balance. Has not had to use extra - Did have significant issues with presyncope and syncope in the past and we have been very cautious- also see above- bp actually high in ER BP Readings from Last 3 Encounters:  02/09/21 122/80  01/03/21 (!) 160/80  12/17/20 108/68  A/P: reasonable control- tough balance with variable BP- continue current meds  # Hyperglycemia/insulin resistance/prediabetes S:  Medication: none Exercise and diet- trying to eat healthy- encouraged exercise Lab Results  Component Value Date   HGBA1C 5.4 05/15/2020   HGBA1C 5.7 10/25/2019   HGBA1C 5.7 (H) 06/03/2017   A/P: hopefully stable- update a1c today. Continue without meds for now  #hernia on right repair did well- still going to have to have the left  Recommended follow up: Return in about 3 months (around 05/12/2021) for a follow-up or sooner if needed. Future Appointments  Date Time Provider [place]  02/15/2021  4:15 PM Rozetta Nunnery, MD [facility] None  03/23/2021  9:30 AM Garvin Fila, MD [facility] None   Lab/Order associations:    ICD-10-CM   1. Dizziness  R42     2. Stage 3 chronic kidney disease, unspecified whether stage 3a or 3b CKD (HCC)  N18.30     3. Primary hypertension  I10 CBC with Differential/Platelet    Comprehensive metabolic panel    4. Senile purpura (HCC)  D69.2     5. Hyperglycemia  R73.9 Hemoglobin A1c     No orders of the defined types were placed in this encounter.  I,Harris Phan,acting as a Education administrator for Garret Reddish, MD.,have documented all relevant documentation on the behalf of Garret Reddish, MD,as directed by  Garret Reddish, MD while in the presence of Garret Reddish, MD.  I, Garret Reddish, MD, have reviewed all documentation for this visit. The documentation on 02/09/21 for the exam, diagnosis, procedures, and orders are all accurate and complete.  Return precautions advised.  Garret Reddish, MD

## 2021-02-10 ENCOUNTER — Other Ambulatory Visit: Payer: Self-pay

## 2021-02-10 DIAGNOSIS — N183 Chronic kidney disease, stage 3 unspecified: Secondary | ICD-10-CM

## 2021-02-15 ENCOUNTER — Ambulatory Visit (INDEPENDENT_AMBULATORY_CARE_PROVIDER_SITE_OTHER): Payer: Medicare Other | Admitting: Otolaryngology

## 2021-02-23 ENCOUNTER — Ambulatory Visit (INDEPENDENT_AMBULATORY_CARE_PROVIDER_SITE_OTHER): Payer: Medicare Other | Admitting: Otolaryngology

## 2021-02-25 DIAGNOSIS — D2261 Melanocytic nevi of right upper limb, including shoulder: Secondary | ICD-10-CM | POA: Diagnosis not present

## 2021-02-25 DIAGNOSIS — D2262 Melanocytic nevi of left upper limb, including shoulder: Secondary | ICD-10-CM | POA: Diagnosis not present

## 2021-02-25 DIAGNOSIS — L430 Hypertrophic lichen planus: Secondary | ICD-10-CM | POA: Diagnosis not present

## 2021-02-25 DIAGNOSIS — D485 Neoplasm of uncertain behavior of skin: Secondary | ICD-10-CM | POA: Diagnosis not present

## 2021-02-25 DIAGNOSIS — L821 Other seborrheic keratosis: Secondary | ICD-10-CM | POA: Diagnosis not present

## 2021-03-15 ENCOUNTER — Telehealth: Payer: Self-pay | Admitting: Family Medicine

## 2021-03-15 NOTE — Telephone Encounter (Unsigned)
Copied from [place] [id number]. Topic: Medicare AWV >> Mar 15, 2021  1:45 PM Harris-Coley, Hannah Beat wrote: Reason for CRM: Left message for patient to schedule Annual Wellness Visit.  Please schedule with Nurse Health Advisor Charlott Rakes, RN at [facility].  Please call [phone] ask for [facility]

## 2021-03-16 ENCOUNTER — Telehealth: Payer: Self-pay | Admitting: Family Medicine

## 2021-03-16 DIAGNOSIS — D631 Anemia in chronic kidney disease: Secondary | ICD-10-CM | POA: Diagnosis not present

## 2021-03-16 DIAGNOSIS — N2581 Secondary hyperparathyroidism of renal origin: Secondary | ICD-10-CM | POA: Diagnosis not present

## 2021-03-16 DIAGNOSIS — N183 Chronic kidney disease, stage 3 unspecified: Secondary | ICD-10-CM | POA: Diagnosis not present

## 2021-03-16 DIAGNOSIS — I951 Orthostatic hypotension: Secondary | ICD-10-CM | POA: Diagnosis not present

## 2021-03-16 DIAGNOSIS — N189 Chronic kidney disease, unspecified: Secondary | ICD-10-CM | POA: Diagnosis not present

## 2021-03-16 LAB — CBC AND DIFFERENTIAL: Hemoglobin: 12.6 (ref 12.0–16.0)

## 2021-03-16 LAB — BASIC METABOLIC PANEL
BUN: 22 — AB (ref 4–21)
CO2: 25 — AB (ref 13–22)
Chloride: 104 (ref 99–108)
Creatinine: 1.6 — AB (ref 0.5–1.1)
Glucose: 89
Potassium: 4.9 (ref 3.4–5.3)
Sodium: 142 (ref 137–147)

## 2021-03-16 LAB — COMPREHENSIVE METABOLIC PANEL
Albumin: 4.4 (ref 3.5–5.0)
Calcium: 9.2 (ref 8.7–10.7)

## 2021-03-16 NOTE — Telephone Encounter (Signed)
Copied from [place] [id number]. Topic: Medicare AWV >> Mar 15, 2021  1:45 PM Harris-Coley, Hannah Beat wrote: Reason for CRM: Left message for patient to schedule Annual Wellness Visit.  Please schedule with Nurse Health Advisor Charlott Rakes, RN at [facility].  Please call [phone] ask for [facility]

## 2021-03-22 DIAGNOSIS — M7072 Other bursitis of hip, left hip: Secondary | ICD-10-CM | POA: Diagnosis not present

## 2021-03-22 DIAGNOSIS — M47816 Spondylosis without myelopathy or radiculopathy, lumbar region: Secondary | ICD-10-CM | POA: Diagnosis not present

## 2021-03-23 ENCOUNTER — Other Ambulatory Visit: Payer: Self-pay

## 2021-03-23 ENCOUNTER — Ambulatory Visit (INDEPENDENT_AMBULATORY_CARE_PROVIDER_SITE_OTHER): Payer: Medicare Other

## 2021-03-23 ENCOUNTER — Encounter: Payer: Self-pay | Admitting: Family Medicine

## 2021-03-23 ENCOUNTER — Ambulatory Visit: Payer: Medicare Other | Admitting: Neurology

## 2021-03-23 ENCOUNTER — Other Ambulatory Visit: Payer: Self-pay | Admitting: Nurse Practitioner

## 2021-03-23 DIAGNOSIS — M8589 Other specified disorders of bone density and structure, multiple sites: Secondary | ICD-10-CM

## 2021-03-23 DIAGNOSIS — Z780 Asymptomatic menopausal state: Secondary | ICD-10-CM | POA: Diagnosis not present

## 2021-03-23 DIAGNOSIS — M810 Age-related osteoporosis without current pathological fracture: Secondary | ICD-10-CM

## 2021-03-30 DIAGNOSIS — Z1231 Encounter for screening mammogram for malignant neoplasm of breast: Secondary | ICD-10-CM | POA: Diagnosis not present

## 2021-03-30 LAB — HM MAMMOGRAPHY

## 2021-04-01 DIAGNOSIS — M7072 Other bursitis of hip, left hip: Secondary | ICD-10-CM | POA: Diagnosis not present

## 2021-04-07 ENCOUNTER — Encounter: Payer: Self-pay | Admitting: Family Medicine

## 2021-04-07 DIAGNOSIS — K4091 Unilateral inguinal hernia, without obstruction or gangrene, recurrent: Secondary | ICD-10-CM | POA: Diagnosis not present

## 2021-04-20 DIAGNOSIS — M7072 Other bursitis of hip, left hip: Secondary | ICD-10-CM | POA: Diagnosis not present

## 2021-04-20 DIAGNOSIS — M47816 Spondylosis without myelopathy or radiculopathy, lumbar region: Secondary | ICD-10-CM | POA: Diagnosis not present

## 2021-04-27 ENCOUNTER — Other Ambulatory Visit: Payer: Self-pay | Admitting: Family Medicine

## 2021-04-28 ENCOUNTER — Telehealth: Payer: Self-pay | Admitting: *Deleted

## 2021-04-28 NOTE — Telephone Encounter (Signed)
Prolia insurance verification has been sent awaiting Summary of benefits  

## 2021-04-28 NOTE — Telephone Encounter (Signed)
[place] 12/17/2020 NEXT INJECTION 06/20/2021

## 2021-05-05 NOTE — Telephone Encounter (Addendum)
Deductible N/A  OOP MAX  $4200 [phone])  Annual exam 12/17/2020  Calcium  9.2           Date 03/16/2021  Upcoming dental procedures   Prior Authorization needed NO  Pt estimated Cost $213    Appt 06/21/2021   Coverage Details: 20% one dose, $0 admin fee

## 2021-05-06 DIAGNOSIS — N281 Cyst of kidney, acquired: Secondary | ICD-10-CM | POA: Diagnosis not present

## 2021-05-13 ENCOUNTER — Ambulatory Visit: Payer: Medicare Other | Admitting: Family Medicine

## 2021-05-13 ENCOUNTER — Other Ambulatory Visit: Payer: Self-pay | Admitting: Family Medicine

## 2021-05-20 ENCOUNTER — Other Ambulatory Visit: Payer: Self-pay

## 2021-05-20 ENCOUNTER — Ambulatory Visit (INDEPENDENT_AMBULATORY_CARE_PROVIDER_SITE_OTHER): Payer: Medicare Other | Admitting: Family Medicine

## 2021-05-20 ENCOUNTER — Encounter: Payer: Self-pay | Admitting: Family Medicine

## 2021-05-20 VITALS — BP 136/78 | HR 74 | Temp 98.3°F | Wt 139.4 lb

## 2021-05-20 DIAGNOSIS — E785 Hyperlipidemia, unspecified: Secondary | ICD-10-CM

## 2021-05-20 DIAGNOSIS — R1031 Right lower quadrant pain: Secondary | ICD-10-CM | POA: Diagnosis not present

## 2021-05-20 DIAGNOSIS — M545 Low back pain, unspecified: Secondary | ICD-10-CM

## 2021-05-20 DIAGNOSIS — I10 Essential (primary) hypertension: Secondary | ICD-10-CM

## 2021-05-20 DIAGNOSIS — F32A Depression, unspecified: Secondary | ICD-10-CM

## 2021-05-20 DIAGNOSIS — G8929 Other chronic pain: Secondary | ICD-10-CM

## 2021-05-20 DIAGNOSIS — R739 Hyperglycemia, unspecified: Secondary | ICD-10-CM | POA: Diagnosis not present

## 2021-05-20 LAB — CBC WITH DIFFERENTIAL/PLATELET
Basophils Absolute: 0 10*3/uL (ref 0.0–0.1)
Basophils Relative: 0.5 % (ref 0.0–3.0)
Eosinophils Absolute: 0.1 10*3/uL (ref 0.0–0.7)
Eosinophils Relative: 1.4 % (ref 0.0–5.0)
HCT: 34.5 % — ABNORMAL LOW (ref 36.0–46.0)
Hemoglobin: 11.2 g/dL — ABNORMAL LOW (ref 12.0–15.0)
Lymphocytes Relative: 28.6 % (ref 12.0–46.0)
Lymphs Abs: 1.6 10*3/uL (ref 0.7–4.0)
MCHC: 32.6 g/dL (ref 30.0–36.0)
MCV: 90.4 fl (ref 78.0–100.0)
Monocytes Absolute: 0.5 10*3/uL (ref 0.1–1.0)
Monocytes Relative: 9.5 % (ref 3.0–12.0)
Neutro Abs: 3.4 10*3/uL (ref 1.4–7.7)
Neutrophils Relative %: 60 % (ref 43.0–77.0)
Platelets: 179 10*3/uL (ref 150.0–400.0)
RBC: 3.81 Mil/uL — ABNORMAL LOW (ref 3.87–5.11)
RDW: 15.6 % — ABNORMAL HIGH (ref 11.5–15.5)
WBC: 5.7 10*3/uL (ref 4.0–10.5)

## 2021-05-20 LAB — COMPREHENSIVE METABOLIC PANEL
ALT: 34 U/L (ref 0–35)
AST: 32 U/L (ref 0–37)
Albumin: 4 g/dL (ref 3.5–5.2)
Alkaline Phosphatase: 50 U/L (ref 39–117)
BUN: 21 mg/dL (ref 6–23)
CO2: 31 mEq/L (ref 19–32)
Calcium: 9.3 mg/dL (ref 8.4–10.5)
Chloride: 105 mEq/L (ref 96–112)
Creatinine, Ser: 1.53 mg/dL — ABNORMAL HIGH (ref 0.40–1.20)
GFR: 32.27 mL/min — ABNORMAL LOW (ref >60.00)
Glucose, Bld: 87 mg/dL (ref 70–99)
Potassium: 4.4 mEq/L (ref 3.5–5.1)
Sodium: 141 mEq/L (ref 135–145)
Total Bilirubin: 0.4 mg/dL (ref 0.2–1.2)
Total Protein: 6.6 g/dL (ref 6.0–8.3)

## 2021-05-20 LAB — POCT URINALYSIS DIPSTICK
Bilirubin, UA: NEGATIVE
Blood, UA: NEGATIVE
Glucose, UA: NEGATIVE
Ketones, UA: NEGATIVE
Leukocytes, UA: NEGATIVE
Nitrite, UA: NEGATIVE
Protein, UA: NEGATIVE
Spec Grav, UA: 1.015 (ref 1.010–1.025)
Urobilinogen, UA: 0.2 E.U./dL
pH, UA: 7 (ref 5.0–8.0)

## 2021-05-20 MED ORDER — DULOXETINE HCL 30 MG PO CPEP: ORAL_CAPSULE | ORAL | 3 refills | Status: DC

## 2021-05-20 NOTE — Progress Notes (Signed)
Phone [phone] In person visit   Subjective:   Sandra Craig is a 80 y.o. year old very pleasant female patient who presents for/with See problem oriented charting Chief Complaint  Patient presents with   Hypertension   Hyperlipidemia   Prediabetes   Depression    Requesting additional medication and suggestions for a therapist    Flank Pain    Right sides flank pain ongoing for 3 months. States that the pain is constant    This visit occurred during the SARS-CoV-2 public health emergency.  Safety protocols were in place, including screening questions prior to the visit, additional usage of staff PPE, and extensive cleaning of exam room while observing appropriate contact time as indicated for disinfecting solutions.   Past Medical History-  Patient Active Problem List   Diagnosis Date Noted   History of transient ischemic attack (TIA) 06/02/2017    Priority: 1.   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   Chronic low back pain 12/20/2013    Priority: 1.   Fibromyalgia 12/05/2007    Priority: 1.   UTI (urinary tract infection) 12/15/2018    Priority: 2.   CKD (chronic kidney disease), stage III ([place]) 06/02/2017    Priority: 2.   Near syncope 03/11/2017    Priority: 2.   History of small bowel obstruction 06/28/2011    Priority: 2.   GERD with stricture 06/28/2011    Priority: 2.   Polycystic kidney disease     Priority: 2.   Hypertension     Priority: 2.   Hyperlipidemia     Priority: 2.   Osteoporosis 12/21/2009    Priority: 2.   INSOMNIA, CHRONIC 10/08/2008    Priority: 2.   Depression 02/08/2008    Priority: 2.   Facet arthropathy 09/14/2019    Priority: 3.   Vaginal dryness 01/29/2019    Priority: 3.   Chronic right shoulder pain 03/07/2014    Priority: 3.   Benign essential tremor 10/09/2013    Priority: 3.   Hyperglycemia 06/27/2012    Priority: 3.   Diverticulitis large intestine 02/11/2012     Priority: 3.   Irritable bowel syndrome (IBS) 06/28/2011    Priority: 3.   IC (interstitial cystitis)     Priority: 3.   Hemorrhoids 10/08/2008    Priority: 3.   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   HEMATURIA UNSPECIFIED 07/04/2008    Priority: 3.   Lung nodules 08/09/2007    Priority: 3.   Senile purpura ([place]) 02/09/2021   Memory loss 07/21/2020    Medications- reviewed and updated Current Outpatient Medications  Medication Sig Dispense Refill   amLODipine (NORVASC) 2.5 MG tablet Take 0.5 tablets (1.25 mg total) by mouth in the morning and at bedtime. Always take 1/2 tablet before bed. Can take 1/2 tablet in AM if blood pressure >165/100 90 tablet 3   aspirin 81 MG tablet Take 1 tablet (81 mg total) by mouth daily. 30 tablet    calcium-vitamin D 250-100 MG-UNIT tablet Take 1 tablet by mouth 2 (two) times daily.     famotidine (PEPCID) 20 MG tablet Take 1 tablet (20 mg total) by mouth 2 (two) times daily. 180 tablet 1   gabapentin (NEURONTIN) 300 MG capsule TAKE 1 CAPSULE(300 MG) BY MOUTH TWICE DAILY AS NEEDED FOR PAIN 180 capsule 3   Multiple Vitamins-Minerals (MULTIVITAMIN WITH MINERALS) tablet Take 1 tablet by mouth daily.  Polyethyl Glycol-Propyl Glycol (SYSTANE) 0.4-0.3 % GEL ophthalmic gel Place 1 application into both eyes 3 (three) times daily as needed (dry eye).     rosuvastatin (CRESTOR) 40 MG tablet TAKE 1 TABLET(40 MG) BY MOUTH DAILY 90 tablet 3   zolpidem (AMBIEN) 5 MG tablet TAKE 1 TABLET BY MOUTH EVERY DAY AT BEDTIME FOR SLEEP 90 tablet 1   DULoxetine (CYMBALTA) 30 MG capsule Take 60 mg in AM and 30 mg in PM 270 capsule 3   No current facility-administered medications for this visit.     Objective:  BP 136/78   Pulse 74   Temp 98.3 F (36.8 C) (Temporal)   Wt 139 lb 6.4 oz (63.2 kg)   LMP 08/25/1992   SpO2 97%   BMI 22.67 kg/m  Gen: NAD, resting comfortably CV: RRR no murmurs rubs or gallops Lungs: CTAB no  crackles, wheeze, rhonchi Abdomen: soft/patient with mild pain to palpation over right lower back-more intense pain is to move around to the right lower abdomen-she actually guards on exam-moderate to severe pain noted/nondistended/normal bowel sounds. No rebound  Ext: no edema Skin: warm, dry     Assessment and Plan   #hypertension S: medication: Amlodipine 1.25 mg before bed  - additional 1.25 mg if blood pressure is elevated in the daytime- pretty much not taking bc not rechecking blood pressure - recommended to uses extra 1.25  if above 150 or even 165 we had discussed- best for balancebetween preventing blood pressure from being too high and orthostatic symptoms Home readings #s: not checking recently in last few months with stress at home BP Readings from Last 3 Encounters:  05/20/21 136/78  02/09/21 122/80  01/03/21 (!) 160/80  A/P: better on repeat/well controlled- continue current meds- do not want to overtreat given prior orthostatic symptoms  #hyperlipidemia #history of TIA S: Medication:rosuvastatin 40 mg and on Aspirin also due to history of TIA  Lab Results  Component Value Date   CHOL 160 07/21/2020   HDL 68 07/21/2020   LDLCALC 75 07/21/2020   LDLDIRECT 59.0 11/03/2020   TRIG 89 07/21/2020   CHOLHDL 2.4 07/21/2020   A/P: Excellent control last check-continue current medication.  Continue aspirin for prevention of TIA recurrence  # Hyperglycemia/insulin resistance/prediabetes S:  Medication: none Exercise and diet- trying to eat healthy - encouraged exercise in the last visit- getting out with dog in yard- cant leave husband so not able to go for walks yet- discussed could do circles in the yard Lab Results  Component Value Date   HGBA1C 5.9 02/09/2021   HGBA1C 5.4 05/15/2020   HGBA1C 5.7 10/25/2019  A/P: Too soon for A1c repeat-discussed healthy eating/regular exercise to help control this. Weight in a good spot   # Depression S: Medication:Cymbalta 30 mg  twice daily  -new marriage not going well at all- feels this was a bad mistake -does not think hes open to counseling together- plus he has been very sick with cancer- non healing ulcer in mouth and cant eat well- losing a lot of weight Depression screen [facility] 2/9 05/20/2021 02/09/2021 11/03/2020  Decreased Interest 3 1 1   Down, Depressed, Hopeless 3 1 1   PHQ - 2 Score 6 2 2   Altered sleeping 0 0 1  Tired, decreased energy 2 1 2   Change in appetite 0 1 1  Feeling bad or failure about yourself  3 0 0  Trouble concentrating 3 1 1   Moving slowly or fidgety/restless 0 0 0  Suicidal thoughts 0  0 0  PHQ-9 Score 14 5 7   Difficult doing work/chores Very difficult Somewhat difficult Somewhat difficult  Some recent data might be hidden  A/P: Depression with partial remission- we are going to try cymbalta 90mg  total per day-60mg  in AM and 30 mg in PM- typically would not increase dose beyond 60mg  at her age but wanted to trial- think wellbutrin not ideal as may worsen anxiety. If she doesn't tolerate this- over sedated, more lightheaded then will go back to lower dose. Recheck in 6 weeks. Also try to get into therapy though may see me even before first therapy visit based on current back up.  -No thoughts of self-harm but she would contact us immediately or call 911 if this were to occur  # Right lower back pain radiating to RLQ S:3 months of symptoms.  Aches in right low back and radiates around to the front- right lower quadrant. Prior liver cyst noted up to 1.4 cm- pain is lower than that though- there was no specific follow up given by radiology. Pain is not worsening from when it started. MRI previously was prior to pain starting. Pain up to 4/10- chronic all the time.  A/P: right flank pain in 80 year old female radiating to RLQ- will start with UA, CBC, CMP. If not revealing could consider ct abd/pelvis with persistent pain for 3 months.  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 Justin Mend  #hernia on right repair did well- still going to have to have the left- scheduled November 2nd - may have to get pushed back with husbands situation  Recommended follow up: Return in about 6 weeks (around 07/01/2021) for a follow-up or sooner if needed . Future Appointments  Date Time Provider [place]  06/21/2021  2:00 PM [facility] NURSE [facility] None   Lab/Order associations:   ICD-10-CM   1. Hyperlipidemia, unspecified hyperlipidemia type  E78.5     2. Primary hypertension  I10     3. Hyperglycemia  R73.9     4. Depression, unspecified depression type  F32.A     5. Chronic right-sided low back pain, unspecified whether sciatica present  M54.50    G89.29     6. Right lower quadrant pain  R10.31 POCT Urinalysis Dipstick    CBC with Differential/Platelet    Comprehensive metabolic panel    Urine Culture      Meds ordered this encounter  Medications   DULoxetine (CYMBALTA) 30 MG capsule    Sig: Take 60 mg in AM and 30 mg in PM    Dispense:  270 capsule    Refill:  3    I,Harris Phan,acting as a scribe for Garret Reddish, MD.,have documented all relevant documentation on the behalf of Garret Reddish, MD,as directed by  Garret Reddish, MD while in the presence of Garret Reddish, MD.  I, Garret Reddish, MD, have reviewed all documentation for this  visit. The documentation on 05/20/21 for the exam, diagnosis, procedures, and orders are all accurate and  complete.   Return precautions advised.  Garret Reddish, MD

## 2021-05-20 NOTE — Addendum Note (Signed)
Addended by: Loura Back on: 05/20/2021 10:33 AM   Modules accepted: Orders

## 2021-05-20 NOTE — Patient Instructions (Addendum)
Health Maintenance Due  Topic Date Due   Zoster Vaccines- Shingrix (1 of 2) - Please check with your pharmacy to see if they have the shingrix vaccine. If they do- please get this immunization and update [place] by phone call or mychart with dates you receive the vaccine.  Never done   Please stop by lab before you go If you have mychart- we will send your results within 3 business days of [place] receiving them.  If you do not have mychart- we will call you about results within 5 business days of [place] receiving them.  *please also note that you will see labs on mychart as soon as they post. I will later go in and write notes on them- will say "notes from Dr. Yong Channel"  We discussed if pain persist and initial urine/lab work-up does not want [place] in a particular direction likely get CT scan with how tender you are on exam  Recommend walking around your yard to get some exercise in - goal is to get at least 150 minutes per week.  Please call [phone] to schedule a visit with [facility] - please tell the office you were directly referred by Dr. Percell Boston Duloxetine/Cymbalta to 60 mg in the mornings and 30 mg in the evenings. If no effective or if you get any worsening symptoms, you may go back to your lower dose.  If you have any thoughts of self harm, please contact us immediately or call 911 to seek medical attention.  Recommended follow up: Return in about 6 weeks (around 07/01/2021) for a follow-up or sooner if needed .

## 2021-05-21 LAB — URINE CULTURE
MICRO NUMBER:: 12529111
Result:: NO GROWTH
SPECIMEN QUALITY:: ADEQUATE

## 2021-05-24 ENCOUNTER — Other Ambulatory Visit: Payer: Medicare Other

## 2021-05-24 ENCOUNTER — Other Ambulatory Visit: Payer: Self-pay

## 2021-05-24 DIAGNOSIS — D649 Anemia, unspecified: Secondary | ICD-10-CM

## 2021-05-27 ENCOUNTER — Other Ambulatory Visit (INDEPENDENT_AMBULATORY_CARE_PROVIDER_SITE_OTHER): Payer: Medicare Other

## 2021-05-27 ENCOUNTER — Other Ambulatory Visit: Payer: Self-pay

## 2021-05-27 DIAGNOSIS — D649 Anemia, unspecified: Secondary | ICD-10-CM | POA: Diagnosis not present

## 2021-05-28 ENCOUNTER — Other Ambulatory Visit: Payer: Medicare Other

## 2021-05-28 ENCOUNTER — Ambulatory Visit: Payer: Medicare Other | Admitting: Family Medicine

## 2021-05-28 LAB — FECAL OCCULT BLOOD, IMMUNOCHEMICAL: Fecal Occult Bld: POSITIVE — AB

## 2021-06-01 NOTE — Progress Notes (Signed)
Phone [phone] In person visit   Subjective:   Sandra Craig is a 80 y.o. year old very pleasant female patient who presents for/with See problem oriented charting Chief Complaint  Patient presents with   Follow-up    This visit occurred during the SARS-CoV-2 public health emergency.  Safety protocols were in place, including screening questions prior to the visit, additional usage of staff PPE, and extensive cleaning of exam room while observing appropriate contact time as indicated for disinfecting solutions.   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[place]) 10/10/2014    Priority: High   Chronic low back pain 12/20/2013    Priority: High   Fibromyalgia 12/05/2007    Priority: High   UTI (urinary tract infection) 12/15/2018    Priority: Medium    CKD (chronic kidney disease), stage III ([place]) 06/02/2017    Priority: Medium    Near syncope 03/11/2017    Priority: Medium    History of small bowel obstruction 06/28/2011    Priority: Medium    GERD with stricture 06/28/2011    Priority: Medium    Polycystic kidney disease     Priority: Medium    Hypertension     Priority: Medium    Hyperlipidemia     Priority: Medium    Osteoporosis 12/21/2009    Priority: Medium    INSOMNIA, CHRONIC 10/08/2008    Priority: Medium    Depression 02/08/2008    Priority: Medium    Facet arthropathy 09/14/2019    Priority: Low   Vaginal dryness 01/29/2019    Priority: Low   Chronic right shoulder pain 03/07/2014    Priority: Low   Benign essential tremor 10/09/2013    Priority: Low   Hyperglycemia 06/27/2012    Priority: Low   Diverticulitis large intestine 02/11/2012    Priority: Low   Irritable bowel syndrome (IBS) 06/28/2011    Priority: Low   IC (interstitial cystitis)     Priority: Low   Hemorrhoids  10/08/2008    Priority: Low   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 Always take 1/2 tablet before bed. Can take 1/2 tablet in AM if blood pressure >165/100 90 tablet 3   aspirin 81 MG tablet Take 1 tablet (81 mg total) by mouth daily. 30 tablet    calcium-vitamin D 250-100 MG-UNIT tablet Take 1 tablet by mouth 2 (two) times daily.     DULoxetine (CYMBALTA) 30 MG capsule Take 60 mg in AM and 30 mg in PM 270 capsule 3   famotidine (PEPCID) 20 MG tablet Take 1 tablet (20 mg total) by mouth 2 (two) times daily. 180 tablet 1   gabapentin (NEURONTIN) 300 MG capsule TAKE 1 CAPSULE(300 MG) BY MOUTH TWICE DAILY AS NEEDED FOR PAIN 180 capsule 3   Multiple Vitamins-Minerals (MULTIVITAMIN WITH MINERALS) tablet Take 1 tablet by mouth daily.     Polyethyl Glycol-Propyl Glycol (SYSTANE) 0.4-0.3 % GEL  ophthalmic gel Place 1 application into both eyes 3 (three) times daily as needed (dry eye).     rosuvastatin (CRESTOR) 40 MG tablet TAKE 1 TABLET(40 MG) BY MOUTH DAILY 90 tablet 3   zolpidem (AMBIEN) 5 MG tablet TAKE 1 TABLET BY MOUTH EVERY DAY AT BEDTIME FOR SLEEP 90 tablet 1   No current facility-administered medications for this visit.     Objective:  LMP 08/25/1992  Gen: NAD, resting comfortably CV: RRR no murmurs rubs or gallops Lungs: CTAB no crackles, wheeze, rhonchi Abdomen: soft/nontender except some guarding with deep palpation/pain in RLQ/nondistended/normal bowel sounds. No rebound  Ext: no edema Skin: warm, dry    Assessment and Plan    #hypertension S: medication: Amlodipine 1.25 mg  before bed  - additional 1.25 mg if blood pressure was elevated in the daytime- pretty much not taking bc not rechecking blood pressure - recommended to uses extra 1.25  if above 150 or even 165 we had discussed- best for balancebetween preventing blood pressure from being too high and orthostatic symptom BP Readings from Last 3 Encounters:  05/20/21 136/78  02/09/21 122/80  01/03/21 (!) 160/80  A/P: Reasonable control-continue current medication-once again want to avoid overtreating due to orthostatic symptoms and even syncope in the past  #hyperlipidemia #history of TIA S: Medication:rosuvastatin 40 mg daily and on Aspirin also due to history of TIA  Lab Results  Component Value Date   CHOL 160 07/21/2020   HDL 68 07/21/2020   LDLCALC 75 07/21/2020   LDLDIRECT 59.0 11/03/2020   TRIG 89 07/21/2020   CHOLHDL 2.4 07/21/2020   A/P: LDL under 70 on last check-continue current medication  # Hyperglycemia/insulin resistance/prediabetes S:  Medication: none Lab Results  Component Value Date   HGBA1C 5.9 02/09/2021   HGBA1C 5.4 05/15/2020   HGBA1C 5.7 10/25/2019   A/P: Too soon for repeat-continue efforts for healthy eating/regular exercise   # Depression S: Medication:Cymbalta 30 mg twice daily- tried 60 mg in the morning and 30 mg in the evening but felt off/loopy -I also encouraged therapist  -new marriage not going well at all- feels this was a bad mistake but may be able to be fixed . He is not interested in therapy. Cancer outlook for husband is better in remission- still with nonhealing ulcer- still losing weight -has started getting out of house more and that has been very helpful -history of alcohol abuse and plans to get back into AA with a friend- still alcohol free Depression screen [facility] 2/9 06/04/2021 05/20/2021 02/09/2021  Decreased Interest 0 3 1  Down, Depressed, Hopeless 0 3 1  PHQ - 2 Score 0 6 2  Altered sleeping - 0 0  Tired, decreased energy - 2 1  Change in  appetite - 0 1  Feeling bad or failure about yourself  - 3 0  Trouble concentrating - 3 1  Moving slowly or fidgety/restless - 0 0  Suicidal thoughts - 0 0  PHQ-9 Score - 14 5  Difficult doing work/chores - Very difficult Somewhat difficult  Some recent data might be hidden  A/P: depression in full remission with phq9 down to 5- goal 5 or less- continue current medicine- with ongoing stress I still think seeing therapy would be good- encouraged her to call to follow up and apologized that behavioral health has not called back yet.    # Right lower back pain radiating to RLQ S:Had 3 months of symptoms during the 10/22 visit. Aches in right low  back and radiated around to the front- right lower quadrant. Prior liver cyst noted up to 1.4 cm- pain was lower than that though- there was no specific follow up given by radiology. Pain was not worsened from when it started. MRI done previously was prior to pain started. Pain up to 4/10- chronic all the time.  We did think this could potentially be radiating from her back with arthritis history-plan was to consider CT if initial work-up including UA, CBC, CMP unrevealing -She ended up having anemia-we ordered stool cards which were positive for blood-have referred to GI for their opinion on neck steps - right low back is at baseline. Has not pushed on RLQ lately but reports no clear flares of pain.   Also had planned left hernia surgery on November 2-patient reports got moved back to end of month A/P: with anemia and blood in stool- encouraged GI follow up though they may want to hold off on repeat at her age- already had 2021- did have many polyps - RLQ pain better but still concerns her (and is rather severe on exam)- encouraged Gi follow up. If they do not recommend colonoscopy and has ongoing pain consider CT  - she is concerned about hernia site as well- requests [place] of RLQ prior to upcoming left abdomen wall surgeyr for hernia  # saw nephrology who  ended up sending her to urology- they said low risk cysts-now 3 month follow up planned with Dr. Jerl Mina months from last visit   #hernia on right repair did well but wonders if RLQ could be related to this- still going to have to have the left- scheduled November 2nd, 2022 - may have to get pushed back with husbands situation  Recommended follow up: No follow-ups on file. Future Appointments  Date Time Provider [place]  06/21/2021  2:00 PM [facility] NURSE [facility] None    Lab/Order associations:   ICD-10-CM   1. Essential hypertension  I10     2. Primary hypertension  I10     3. Hyperglycemia  R73.9     4. Depression, unspecified depression type  F32.A     5. History of transient ischemic attack (TIA)  Z86.73       No orders of the defined types were placed in this encounter.   I,Jada Bradford,acting as a scribe for Garret Reddish, MD.,have documented all relevant documentation on the behalf of Garret Reddish, MD,as directed by  Garret Reddish, MD while in the presence of Garret Reddish, MD.  I, Garret Reddish, MD, have reviewed all documentation for this visit. The documentation on 06/04/21 for the exam, diagnosis, procedures, and orders are all accurate and complete.  Return precautions advised.  Garret Reddish, MD

## 2021-06-04 ENCOUNTER — Encounter: Payer: Self-pay | Admitting: Family Medicine

## 2021-06-04 ENCOUNTER — Other Ambulatory Visit: Payer: Self-pay

## 2021-06-04 ENCOUNTER — Ambulatory Visit (INDEPENDENT_AMBULATORY_CARE_PROVIDER_SITE_OTHER): Payer: Medicare Other | Admitting: Family Medicine

## 2021-06-04 VITALS — BP 138/86 | HR 69 | Temp 98.3°F | Ht 65.75 in | Wt 140.0 lb

## 2021-06-04 DIAGNOSIS — I10 Essential (primary) hypertension: Secondary | ICD-10-CM | POA: Diagnosis not present

## 2021-06-04 DIAGNOSIS — R1031 Right lower quadrant pain: Secondary | ICD-10-CM

## 2021-06-04 DIAGNOSIS — R739 Hyperglycemia, unspecified: Secondary | ICD-10-CM | POA: Diagnosis not present

## 2021-06-04 DIAGNOSIS — Z8673 Personal history of transient ischemic attack (TIA), and cerebral infarction without residual deficits: Secondary | ICD-10-CM | POA: Diagnosis not present

## 2021-06-04 DIAGNOSIS — F3342 Major depressive disorder, recurrent, in full remission: Secondary | ICD-10-CM

## 2021-06-04 DIAGNOSIS — F32A Depression, unspecified: Secondary | ICD-10-CM

## 2021-06-04 DIAGNOSIS — M545 Low back pain, unspecified: Secondary | ICD-10-CM

## 2021-06-04 DIAGNOSIS — G8929 Other chronic pain: Secondary | ICD-10-CM

## 2021-06-04 LAB — CBC WITH DIFFERENTIAL/PLATELET
Basophils Absolute: 0 10*3/uL (ref 0.0–0.1)
Basophils Relative: 0.9 % (ref 0.0–3.0)
Eosinophils Absolute: 0.1 10*3/uL (ref 0.0–0.7)
Eosinophils Relative: 1.3 % (ref 0.0–5.0)
HCT: 35 % — ABNORMAL LOW (ref 36.0–46.0)
Hemoglobin: 11.4 g/dL — ABNORMAL LOW (ref 12.0–15.0)
Lymphocytes Relative: 33.4 % (ref 12.0–46.0)
Lymphs Abs: 1.8 10*3/uL (ref 0.7–4.0)
MCHC: 32.6 g/dL (ref 30.0–36.0)
MCV: 90.1 fl (ref 78.0–100.0)
Monocytes Absolute: 0.4 10*3/uL (ref 0.1–1.0)
Monocytes Relative: 7.2 % (ref 3.0–12.0)
Neutro Abs: 3 10*3/uL (ref 1.4–7.7)
Neutrophils Relative %: 57.2 % (ref 43.0–77.0)
Platelets: 188 10*3/uL (ref 150.0–400.0)
RBC: 3.89 Mil/uL (ref 3.87–5.11)
RDW: 15.2 % (ref 11.5–15.5)
WBC: 5.3 10*3/uL (ref 4.0–10.5)

## 2021-06-04 MED ORDER — DULOXETINE HCL 30 MG PO CPEP: ORAL_CAPSULE | ORAL | 3 refills | Status: DC

## 2021-06-04 NOTE — Addendum Note (Signed)
Addended by: [name] on: 06/04/2021 01:33 PM   Modules accepted: Orders

## 2021-06-04 NOTE — Patient Instructions (Addendum)
Health Maintenance Due  Topic Date Due   Zoster Vaccines- Shingrix (1 of 2)-  Please check with your pharmacy to see if they have the shingrix vaccine. If they do- please get this immunization and update [place] by phone call or mychart with dates you receive the vaccine Never done   We will call you within two weeks about your referral to [facility] for ultrasound of right lower quadrant. If you do not hear within 2 weeks, give [place] a call.   Please call due to anemia and blood in stools [facility] contact Address: [address], [place], [place] [address] Phone: [phone]  Please stop by lab before you go If you have mychart- we will send your results within 3 business days of [place] receiving them.  If you do not have mychart- we will call you about results within 5 business days of [place] receiving them.  *please also note that you will see labs on mychart as soon as they post. I will later go in and write notes on them- will say "notes from Dr. Yong Channel"  Recommended follow up: Return in about 6 months (around 12/02/2021) for follow-up or sooner if needed.

## 2021-06-15 ENCOUNTER — Ambulatory Visit
Admission: RE | Admit: 2021-06-15 | Discharge: 2021-06-15 | Disposition: A | Discharge status: Home / Self Care | Payer: Medicare Other | Source: Ambulatory Visit | Attending: Family Medicine | Admitting: Family Medicine

## 2021-06-15 DIAGNOSIS — R1031 Right lower quadrant pain: Secondary | ICD-10-CM | POA: Diagnosis not present

## 2021-06-21 ENCOUNTER — Other Ambulatory Visit: Payer: Self-pay

## 2021-06-21 ENCOUNTER — Ambulatory Visit (INDEPENDENT_AMBULATORY_CARE_PROVIDER_SITE_OTHER): Payer: Medicare Other | Admitting: *Deleted

## 2021-06-21 DIAGNOSIS — M810 Age-related osteoporosis without current pathological fracture: Secondary | ICD-10-CM

## 2021-06-21 MED ORDER — DENOSUMAB 60 MG/ML ~~LOC~~ SOSY
60.0000 mg | PREFILLED_SYRINGE | Freq: Once | SUBCUTANEOUS | Status: AC
  Administered 2021-06-21: 60 mg via SUBCUTANEOUS

## 2021-06-28 DIAGNOSIS — K4090 Unilateral inguinal hernia, without obstruction or gangrene, not specified as recurrent: Secondary | ICD-10-CM | POA: Diagnosis not present

## 2021-06-29 NOTE — Telephone Encounter (Signed)
Prolia given 06-21-21. See nurse visit encounter.

## 2021-06-29 NOTE — Telephone Encounter (Signed)
Summary of benefits scanned.   Encounter closed.

## 2021-07-06 ENCOUNTER — Encounter: Payer: Self-pay | Admitting: Family Medicine

## 2021-07-06 DIAGNOSIS — F3341 Major depressive disorder, recurrent, in partial remission: Secondary | ICD-10-CM

## 2021-07-27 ENCOUNTER — Other Ambulatory Visit: Payer: Self-pay | Admitting: Family Medicine

## 2021-08-04 ENCOUNTER — Encounter: Payer: Self-pay | Admitting: Family Medicine

## 2021-08-05 NOTE — Telephone Encounter (Signed)
See below

## 2021-08-05 NOTE — Telephone Encounter (Signed)
Callers blood pressure is showing 180/90 at noon, 183/90, 189-96 high blood pressure, would like to kno wif she should take Rx earlier than normal. Translation No Nurse Assessment Nurse: Rufina Falco, RN, Deb Date/Time (Eastern Time): 08/04/2021 9:45:56 PM Confirm and document reason for call. If symptomatic, describe symptoms. ---Callers blood pressure is showing 180/90 at noon, 183/90, 189-96 high blood pressure, would like to know if she should take Rx earlier than normal. Does the patient have any new or worsening symptoms? ---Yes Will a triage be completed? ---Yes Related visit to physician within the last 2 weeks? ---N/A Does the PT have any chronic conditions? (i.e. diabetes, asthma, this includes High risk factors for pregnancy, etc.) ---Yes List chronic conditions. ---HTN, chronic kidney disease, Is this a behavioral health or substance abuse call? ---No Guidelines Guideline Title Affirmed Question Affirmed Notes Nurse Date/Time (Eastern Time) Blood Pressure - High [1] Systolic BP >= 610 OR Diastolic >= 960 AND [4] cardiac or neurologic symptoms (e.g., chest pain, difficulty Rufina Falco, RN, Deb 08/04/2021 9:46:54 PM PLEASE NOTE: All timestamps contained within this report are represented as [place] Standard Time. CONFIDENTIALTY NOTICE: This fax transmission is intended only for the addressee. It contains information that is legally privileged, confidential or otherwise protected from use or disclosure. If you are not the intended recipient, you are strictly prohibited from reviewing, disclosing, copying using or disseminating any of this information or taking any action in reliance on or regarding this information. If you have received this fax in error, please notify us immediately by telephone so that we can arrange for its return to [place]. Phone: [phone], Toll-Free: [phone], Fax: [phone] Page: 2 of 2 Call Id: [id number] Guidelines Guideline Title Affirmed Question  Affirmed Notes Nurse Date/Time Eilene Ghazi Time) breathing, unsteady gait, blurred vision) Disp. Time Eilene Ghazi Time) Disposition Final User 08/04/2021 9:49:44 PM Go to ED Now Yes Rufina Falco, RN, Deb Caller Disagree/Comply Disagree Caller Understands Yes PreDisposition InappropriateToAsk Care Advice Given Per Guideline CARE ADVICE given per High Blood Pressure (Adult) guideline. GO TO ED NOW: * You need to be seen in the Emergency Department. * Go to the ED at ___________ [place] now. Drive carefully. * Another adult should drive. NOTE TO TRIAGER - DRIVING: Referrals GO TO FACILITY REFUSED

## 2021-08-16 DIAGNOSIS — N2581 Secondary hyperparathyroidism of renal origin: Secondary | ICD-10-CM | POA: Diagnosis not present

## 2021-08-16 DIAGNOSIS — N183 Chronic kidney disease, stage 3 unspecified: Secondary | ICD-10-CM | POA: Diagnosis not present

## 2021-08-16 DIAGNOSIS — I951 Orthostatic hypotension: Secondary | ICD-10-CM | POA: Diagnosis not present

## 2021-08-16 DIAGNOSIS — D631 Anemia in chronic kidney disease: Secondary | ICD-10-CM | POA: Diagnosis not present

## 2021-08-26 ENCOUNTER — Ambulatory Visit: Payer: Medicare Other

## 2021-08-26 DIAGNOSIS — M25512 Pain in left shoulder: Secondary | ICD-10-CM | POA: Diagnosis not present

## 2021-08-26 DIAGNOSIS — M542 Cervicalgia: Secondary | ICD-10-CM | POA: Diagnosis not present

## 2021-09-21 ENCOUNTER — Telehealth: Payer: Self-pay | Admitting: Family Medicine

## 2021-09-21 NOTE — Chronic Care Management (AMB) (Signed)
°  Chronic Care Management   Outreach Note  09/21/2021 Name: Sandra Craig MRN: [id number] DOB: November 08, 1940  Referred by: [name], MD Reason for referral : No chief complaint on file.   An unsuccessful telephone outreach was attempted today. The patient was referred to the pharmacist for assistance with care management and care coordination.   Follow Up Plan:   Tatjana Dellinger Upstream Scheduler

## 2021-09-27 ENCOUNTER — Telehealth: Payer: Self-pay | Admitting: Family Medicine

## 2021-09-27 NOTE — Chronic Care Management (AMB) (Signed)
°  Chronic Care Management   Outreach Note  09/27/2021 Name: Sandra Craig MRN: [id number] DOB: 28-Mar-1941  Referred by: [name], MD Reason for referral : No chief complaint on file.   A second unsuccessful telephone outreach was attempted today. The patient was referred to pharmacist for assistance with care management and care coordination.  Follow Up Plan:   Tatjana Dellinger Upstream Scheduler

## 2021-09-29 DIAGNOSIS — M19072 Primary osteoarthritis, left ankle and foot: Secondary | ICD-10-CM | POA: Diagnosis not present

## 2021-09-29 DIAGNOSIS — M542 Cervicalgia: Secondary | ICD-10-CM | POA: Diagnosis not present

## 2021-10-04 ENCOUNTER — Telehealth: Payer: Self-pay | Admitting: Family Medicine

## 2021-10-04 NOTE — Progress Notes (Signed)
?  Chronic Care Management  ? ?Note ? ?10/04/2021 ?Name: Sandra Craig MRN: [id number] DOB: 1941/04/03 ? ?Sandra Craig is a 81 y.o. year old female who is a primary care patient of [name], MD. I reached out to Reggie Pile by phone today in response to a referral sent by [name]'s PCP, [name], MD.  ? ?Sandra Craig was given information about Chronic Care Management services today including:  ?CCM service includes personalized support from designated clinical staff supervised by her physician, including individualized plan of care and coordination with other care providers ?24/7 contact phone numbers for assistance for urgent and routine care needs. ?Service will only be billed when office clinical staff spend 20 minutes or more in a month to coordinate care. ?Only one practitioner may furnish and bill the service in a calendar month. ?The patient may stop CCM services at any time (effective at the end of the month) by phone call to the office staff. ? ? ?Patient agreed to services and verbal consent obtained.  ? ?Follow up plan: ? ? ?Tatjana Dellinger ?Upstream Scheduler  ?

## 2021-10-07 ENCOUNTER — Telehealth: Payer: Self-pay | Admitting: Pharmacist

## 2021-10-07 NOTE — Progress Notes (Signed)
? ? ?Chronic Care Management ?Pharmacy Assistant  ? ?Name: Sandra Craig  MRN: [id number] DOB: [birth year] ? ? ?Reason for Encounter: Chart Review For Initial Visit With Clinical Pharmacist ?  ?Conditions to be addressed/monitored: ?HTN, GERD, IBS, Osteoporosis, CKD, HLD, Hyperglycemia ? ?Primary concerns for visit include: ?HTN, CKD, HLD, Hyperglycemia  ? ?Recent office visits:  ?06/04/2021 [facility] (PCP) [name], MD;  Amlodipine 1.25 mg before bed  - additional 1.25 mg if blood pressure was elevated in the daytime- pretty much not taking bc not rechecking blood pressure ?- recommended to uses extra 1.25  if above 150 or even 165 we had discussed- best for balancebetween preventing blood pressure from being too high and orthostatic symptom ? ?05/20/2021 [facility] (PCP) [name], MD;  Depression with partial remission- we are going to try cymbalta 90mg  total per day-60mg  in AM and 30 mg in PM- typically would not increase dose beyond 60mg  at her age but wanted to trial- think wellbutrin not ideal as may worsen anxiety. If she doesn't tolerate this- over sedated, more lightheaded then will go back to lower dose ? ?Recent consult visits:  ?None ? ?Hospital visits:  ?None in previous 6 months ? ?Medications: ?Outpatient Encounter Medications as of 10/07/2021  ?Medication Sig  ? amLODipine (NORVASC) 2.5 MG tablet Take 0.5 tablets (1.25 mg total) by mouth in the morning and at bedtime. Always take 1/2 tablet before bed. Can take 1/2 tablet in AM if blood pressure >165/100  ? aspirin 81 MG tablet Take 1 tablet (81 mg total) by mouth daily.  ? calcium-vitamin D 250-100 MG-UNIT tablet Take 1 tablet by mouth 2 (two) times daily.  ? DULoxetine (CYMBALTA) 30 MG capsule TAKE ONE CAPSULE BY MOUTH TWICE DAILY  ? famotidine (PEPCID) 20 MG tablet Take 1 tablet (20 mg total) by mouth 2 (two) times daily.  ? gabapentin (NEURONTIN) 300 MG capsule TAKE 1 CAPSULE(300 MG) BY MOUTH TWICE DAILY AS NEEDED FOR PAIN  ? Multiple  Vitamins-Minerals (MULTIVITAMIN WITH MINERALS) tablet Take 1 tablet by mouth daily.  ? Polyethyl Glycol-Propyl Glycol (SYSTANE) 0.4-0.3 % GEL ophthalmic gel Place 1 application into both eyes 3 (three) times daily as needed (dry eye).  ? rosuvastatin (CRESTOR) 40 MG tablet TAKE 1 TABLET(40 MG) BY MOUTH DAILY  ? zolpidem (AMBIEN) 5 MG tablet TAKE 1 TABLET BY MOUTH EVERY DAY AT BEDTIME FOR SLEEP  ? ?No facility-administered encounter medications on file as of 10/07/2021.  ? ?Current Medications: ?Cymbalta 30 mg last filled 09/29/2021 two in morning, one at night ?Gabapentin 300 mg last filled 05/08/2021 90 DS ?Rosuvastatin 40 mg last filled 09/29/2021 ?Zolpidem 5 mg last filled 09/29/2021 ?Famotidine 20 mg last filled 08/20/2020 90 DS ?Amlodipine 2.5 mg last filled 11/18/2020 90 DS ?Multiple Vitamins-Minerals takes daily ?Calcium-vitamin D 250-100 mg takes daily ?Asprin 81 mg takes daily ?Systane 0.4-0.3 % ophthalmic gel as needed ? ?Patient Questions: ?Any changes in your medications or health? ?Patient denies any recent changes in her medications or health. ? ?Any side effects from any medications?  ?Patient denies any side effects from any of her medications. ? ?Do you have any symptoms or problems not managed by your medications? ?Patient denies having any symptoms or problems that is not currently managed by her medications. ? ?Any concerns about your health right now? ?Patient states he has some concerns with a pinched nerve she has in her neck. ? ?Has your provider asked that you check blood pressure, blood sugar, or follow special diet  at home? ?Patient states she checks her blood pressure regularly. She does not check blood sugars. She does not follow any special diet. ? ?Do you get any type of exercise on a regular basis? ?Patient states she likes to take her dog for a walk. ? ?Can you think of a goal you would like to reach for your health? ?Patient states she would like to be 81 years old again. ? ?Do you  have any problems getting your medications? ?Patient denies any troubles or issues getting any of her medications. ? ?Is there anything that you would like to discuss during the appointment?  ?Patient states she does not have anything she would like to discuss. ? ?Please bring medications and supplements to appointment ? ?Care Gaps: ?Medicare Annual Wellness: Last AWV 08/01/2019 ?Hemoglobin A1C: 5.9% on 02/09/2021 ?Colonoscopy: Completed 09/12/2019 ?Dexa Scan: Completed ?Mammogram: Next due on 03/30/2022 ? ?Future Appointments  ?Date Time Provider [place]  ?10/12/2021 11:00 AM [facility] CCM PHARMACIST [facility]  ?10/25/2021 11:00 AM Bauert, Terri W, LCSW [facility] None  ?11/04/2021 12:00 PM Bauert, Terri W, LCSW [facility] None  ?12/03/2021  1:40 PM Hunter, [name], MD [facility]  ? ? ?Star Rating Drugs: ?Rosuvastatin 40 mg last filled 09/29/2021 ? ?April D Calhoun, [place] ?Clinical Pharmacist Assistant ?[phone]  ?

## 2021-10-11 NOTE — Progress Notes (Unsigned)
Chronic Care Management Pharmacy Note  10/11/2021 Name:  Sandra Craig MRN:  [id number] DOB:  04/07/1941  Summary: ***  Recommendations/Changes made from today's visit: ***  Plan: ***   Subjective: Sandra Craig is an 81 y.o. year old female who is a primary patient of Hunter, [name], MD.  The CCM team was consulted for assistance with disease management and care coordination needs.    {CCMTELEPHONEFACETOFACE:21091510} for initial visit in response to provider referral for pharmacy case management and/or care coordination services.   Consent to Services:  The patient was given the following information about Chronic Care Management services today, agreed to services, and gave verbal consent: 1. CCM service includes personalized support from designated clinical staff supervised by the primary care provider, including individualized plan of care and coordination with other care providers 2. 24/7 contact phone numbers for assistance for urgent and routine care needs. 3. Service will only be billed when office clinical staff spend 20 minutes or more in a month to coordinate care. 4. Only one practitioner may furnish and bill the service in a calendar month. 5.The patient may stop CCM services at any time (effective at the end of the month) by phone call to the office staff. 6. The patient will be responsible for cost sharing (co-pay) of up to 20% of the service fee (after annual deductible is met). Patient agreed to services and consent obtained.  Patient Care Team: [name], MD as PCP - General (Family Medicine) Collene Gobble, MD as Consulting Physician (Pulmonary Disease) Huel Cote, NP (Inactive) as Nurse Practitioner (Obstetrics and Gynecology) Laroy Apple, MD as Referring Physician (Physical Medicine and Rehabilitation) Surgery, [facility] as Surgeon (General Surgery) [facility], [facility] as Pharmacist (Pharmacist)  Recent office visits:   06/04/2021 OV (PCP) [name], MD;  Amlodipine 1.25 mg before bed  - additional 1.25 mg if blood pressure was elevated in the daytime- pretty much not taking bc not rechecking blood pressure - recommended to uses extra 1.25  if above 150 or even 165 we had discussed- best for balancebetween preventing blood pressure from being too high and orthostatic symptom   05/20/2021 OV (PCP) [name], MD;  Depression with partial remission- we are going to try cymbalta 47m total per day-673min AM and 30 mg in PM- typically would not increase dose beyond 6018mt her age but wanted to trial- think wellbutrin not ideal as may worsen anxiety. If she doesn't tolerate this- over sedated, more lightheaded then will go back to lower dose   Recent consult visits:  None   Hospital visits:  None in previous 6 months   Objective:  Lab Results  Component Value Date   CREATININE 1.53 (H) 05/20/2021   BUN 21 05/20/2021   GFR 32.27 (L) 05/20/2021   GFRNONAA 29 (L) 01/03/2021   GFRAA 35 (L) 07/21/2020   NA 141 05/20/2021   K 4.4 05/20/2021   CALCIUM 9.3 05/20/2021   CO2 31 05/20/2021   GLUCOSE 87 05/20/2021    Lab Results  Component Value Date/Time   HGBA1C 5.9 02/09/2021 11:51 AM   HGBA1C 5.4 05/15/2020 12:20 PM   GFR 32.27 (L) 05/20/2021 10:02 AM   GFR 29.75 (L) 02/09/2021 11:51 AM    Last diabetic Eye exam: No results found for: HMDIABEYEEXA  Last diabetic Foot exam: No results found for: HMDIABFOOTEX   Lab Results  Component Value Date   CHOL 160 07/21/2020   HDL  68 07/21/2020   LDLCALC 75 07/21/2020   LDLDIRECT 59.0 11/03/2020   TRIG 89 07/21/2020   CHOLHDL 2.4 07/21/2020    Hepatic Function Latest Ref Rng & Units 05/20/2021 03/16/2021 02/09/2021  Total Protein 6.0 - 8.3 g/dL 6.6 - 6.7  Albumin 3.5 - 5.2 g/dL 4.0 4.4 4.1  AST 0 - 37 U/L 32 - 39(H)  ALT 0 - 35 U/L 34 - 42(H)  Alk Phosphatase 39 - 117 U/L 50 - 50  Total Bilirubin 0.2 - 1.2 mg/dL 0.4 - 0.5  Bilirubin,  Direct 0.0 - 0.3 mg/dL - - -    Lab Results  Component Value Date/Time   TSH 2.60 11/11/2019 02:53 PM   TSH 1.63 03/10/2017 02:18 PM   FREET4 0.64 10/09/2013 04:21 PM   FREET4 0.7 12/11/2006 07:46 AM    CBC Latest Ref Rng & Units 06/04/2021 05/20/2021 03/16/2021  WBC 4.0 - 10.5 K/uL 5.3 5.7 -  Hemoglobin 12.0 - 15.0 g/dL 11.4(L) 11.2(L) 12.6  Hematocrit 36.0 - 46.0 % 35.0(L) 34.5(L) -  Platelets 150.0 - 400.0 K/uL 188.0 179.0 -    Lab Results  Component Value Date/Time   VD25OH 37.49 01/29/2019 04:14 PM   VD25OH 39 12/28/2011 02:19 PM   VD25OH 42 03/10/2009 08:52 PM    Clinical ASCVD: {YES/NO:21197} The 10-year ASCVD risk score (Arnett DK, et al., 2019) is: 36.7%   Values used to calculate the score:     Age: 68 years     Sex: Female     Is Non-Hispanic African American: No     Diabetic: No     Tobacco smoker: No     Systolic Blood Pressure: 081 mmHg     Is BP treated: Yes     HDL Cholesterol: 68 mg/dL     Total Cholesterol: 160 mg/dL    Depression screen [facility] 2/9 06/04/2021 05/20/2021 02/09/2021  Decreased Interest 0 3 1  Down, Depressed, Hopeless 0 3 1  PHQ - 2 Score 0 6 2  Altered sleeping - 0 0  Tired, decreased energy - 2 1  Change in appetite - 0 1  Feeling bad or failure about yourself  - 3 0  Trouble concentrating - 3 1  Moving slowly or fidgety/restless - 0 0  Suicidal thoughts - 0 0  PHQ-9 Score - 14 5  Difficult doing work/chores - Very difficult Somewhat difficult  Some recent data might be hidden     ***Other: (CHADS2VASc if Afib, MMRC or CAT for COPD, ACT, DEXA)  Social History   Tobacco Use  Smoking Status Former   Packs/day: 0.75   Years: 8.00   Pack years: 6.00   Types: Cigarettes   Quit date: 08/01/1976   Years since quitting: 45.2  Smokeless Tobacco Never  Tobacco Comments   Quit in 1978   BP Readings from Last 3 Encounters:  06/04/21 138/86  05/20/21 136/78  02/09/21 122/80   Pulse Readings from Last 3 Encounters:  06/04/21 69   05/20/21 74  02/09/21 71   Wt Readings from Last 3 Encounters:  06/04/21 140 lb (63.5 kg)  05/20/21 139 lb 6.4 oz (63.2 kg)  02/09/21 147 lb (66.7 kg)   BMI Readings from Last 3 Encounters:  06/04/21 22.77 kg/m  05/20/21 22.67 kg/m  02/09/21 23.91 kg/m    Assessment/Interventions: Review of patient past medical history, allergies, medications, health status, including review of consultants reports, laboratory and other test data, was performed as part of comprehensive evaluation and provision of chronic  care management services.   SDOH:  (Social Determinants of Health) assessments and interventions performed: {yes/no:20286}  SDOH Screenings   Alcohol Screen: Not on file  Depression (PHQ2-9): Low Risk    PHQ-2 Score: 0  Financial Resource Strain: Not on file  Food Insecurity: Not on file  Housing: Not on file  Physical Activity: Not on file  Social Connections: Not on file  Stress: Not on file  Tobacco Use: Medium Risk   Smoking Tobacco Use: Former   Smokeless Tobacco Use: Never   Passive Exposure: Not on file  Transportation Needs: Not on file    CCM Care Plan  Allergies  Allergen Reactions   Azithromycin Rash    Pruritic rash diffuse    Celecoxib Nausea Only    Medications Reviewed Today     Reviewed by [name], MD (Physician) on 06/04/21 at 45  Med List Status: <None>   Medication Order Taking? Sig Documenting Provider Last Dose Status Informant  amLODipine (NORVASC) 2.5 MG tablet [id number] Yes Take 0.5 tablets (1.25 mg total) by mouth in the morning and at bedtime. Always take 1/2 tablet before bed. Can take 1/2 tablet in AM if blood pressure >165/100 [name], MD Taking Active   aspirin 81 MG tablet [id number] Yes Take 1 tablet (81 mg total) by mouth daily. Frann Rider, NP Taking Active Self  calcium-vitamin D 250-100 MG-UNIT tablet [id number] Yes Take 1 tablet by mouth 2 (two) times daily. [provider] Taking Active Self   DULoxetine (CYMBALTA) 30 MG capsule [id number] Yes Take 60 mg in AM and 30 mg in PM [name], MD Taking Active   famotidine (PEPCID) 20 MG tablet [id number] Yes Take 1 tablet (20 mg total) by mouth 2 (two) times daily. [name], MD Taking Active   gabapentin (NEURONTIN) 300 MG capsule [id number] Yes TAKE 1 CAPSULE(300 MG) BY MOUTH TWICE DAILY AS NEEDED FOR PAIN [name], MD Taking Active   Multiple Vitamins-Minerals (MULTIVITAMIN WITH MINERALS) tablet [id number] Yes Take 1 tablet by mouth daily. [provider] Taking Active Self  Polyethyl Glycol-Propyl Glycol (SYSTANE) 0.4-0.3 % GEL ophthalmic gel [id number] Yes Place 1 application into both eyes 3 (three) times daily as needed (dry eye). [provider] Taking Active Self  rosuvastatin (CRESTOR) 40 MG tablet [id number] Yes TAKE 1 TABLET(40 MG) BY MOUTH DAILY [name], MD Taking Active   zolpidem (AMBIEN) 5 MG tablet [id number] Yes TAKE 1 TABLET BY MOUTH EVERY DAY AT BEDTIME FOR SLEEP [name], MD Taking Active             Patient Active Problem List   Diagnosis Date Noted   Senile purpura ([place]) 02/09/2021   Memory loss 07/21/2020   Facet arthropathy 09/14/2019   Vaginal dryness 01/29/2019   UTI (urinary tract infection) 12/15/2018   History of transient ischemic attack (TIA) 06/02/2017   CKD (chronic kidney disease), stage III ([place]) 06/02/2017   Near syncope 03/11/2017   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   Chronic right shoulder pain 03/07/2014   Chronic low back pain 12/20/2013   Benign essential tremor 10/09/2013   Hyperglycemia 06/27/2012   Diverticulitis large intestine 02/11/2012   Irritable bowel syndrome (IBS) 06/28/2011   History of small bowel obstruction 06/28/2011   GERD with stricture 06/28/2011   Polycystic kidney disease    Hypertension    Hyperlipidemia    IC (interstitial cystitis)    Osteoporosis 12/21/2009   INSOMNIA,  CHRONIC 10/08/2008  Hemorrhoids 10/08/2008   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   HEMATURIA UNSPECIFIED 07/04/2008   Depression 02/08/2008   Fibromyalgia 12/05/2007   Lung nodules 08/09/2007    Immunization History  Administered Date(s) Administered   Fluad Quad(high Dose 65+) 04/02/2019, 04/05/2020, 05/06/2021   Influenza Split 05/23/2012   Influenza Whole 05/31/2006, 05/28/2007, 05/14/2008, 04/22/2009, 05/01/2010, 04/12/2011   Influenza, High Dose Seasonal PF 05/19/2013, 03/26/2014, 04/21/2016, 04/21/2017, 04/21/2018, 04/02/2019   Influenza,inj,Quad PF,6+ Mos 04/15/2015   Influenza-Unspecified 04/29/2018   Moderna Sars-Covid-2 Vaccination 04/14/2021   PFIZER(Purple Top)SARS-COV-2 Vaccination 09/07/2019, 09/27/2019, 05/27/2020   Pneumococcal Conjugate-13 02/03/2015   Pneumococcal Polysaccharide-23 05/31/2006, 06/07/2012   Td 12/21/2009   Zoster, Live 02/06/2006    Conditions to be addressed/monitored:  HTN, GERD, Ostopeorosis, Depression, HLD, Insomni  There are no care plans that you recently modified to display for this patient.    Medication Assistance: {MEDASSISTANCEINFO:25044}  Compliance/Adherence/Medication fill history: Care Gaps: ***  Star-Rating Drugs: Rosuvastatin 40 mg last filled 09/29/2021  Patient's preferred pharmacy is:  [facility] [place], [place] [address] Phone: [phone] Fax: [phone]  [id number] - [address] [place] AT [facility] [address] [place] [address] Phone: [phone] Fax: [phone]  [facility] [place], [place] [address] [address] [place] [address] [place] [address] Phone: [phone] Fax: [phone]  Uses pill box? {Yes or If no, why not?:20788} Pt endorses ***% compliance  We discussed:  {Pharmacy options:24294} Patient decided to: {US Pharmacy Plan:23885}  Care Plan and Follow Up Patient Decision:  {FOLLOWUP:24991}  Plan: {CM FOLLOW UP XHBZ:16967}  ***   Current Barriers:  {pharmacybarriers:24917}  Pharmacist Clinical Goal(s):  Patient will {PHARMACYGOALCHOICES:24921} through collaboration with PharmD and provider.   Interventions: 1:1 collaboration with [name], MD regarding development and update of comprehensive plan of care as evidenced by provider attestation and co-signature Inter-disciplinary care team collaboration (see longitudinal plan of care) Comprehensive medication review performed; medication list updated in electronic medical record  Hypertension (BP goal {CHL HP UPSTREAM Pharmacist BP ranges:[phone]}) -{US controlled/uncontrolled:25276} -Current treatment: *** -Medications previously tried: ***  -Current home readings: *** -Current dietary habits: *** -Current exercise habits: *** -{ACTIONS;DENIES/REPORTS:21021675::"Denies"} hypotensive/hypertensive symptoms -Educated on {CCM BP Counseling:25124} -Counseled to monitor BP at home ***, document, and provide log at future appointments -{CCMPHARMDINTERVENTION:25122}  Hyperlipidemia: (LDL goal < ***) -{US controlled/uncontrolled:25276} -Current treatment: *** -Medications previously tried: ***  -Current dietary patterns: *** -Current exercise habits: *** -Educated on {CCM HLD Counseling:25126} -{CCMPHARMDINTERVENTION:25122}  Depression/Anxiety (Goal: ***) -{US controlled/uncontrolled:25276} -Current treatment: *** -Medications previously tried/failed: *** -PHQ9: *** -GAD7: *** -Connected with *** for mental health support -Educated on {CCM mental health counseling:25127} -{CCMPHARMDINTERVENTION:25122}  Osteoporosis / Osteopenia (Goal ***) -{US controlled/uncontrolled:25276} -Last DEXA Scan: ***   T-Score femoral neck: ***  T-Score total hip: ***  T-Score lumbar  spine: ***  T-Score forearm radius: ***  10-year probability of major osteoporotic fracture: ***  10-year probability of hip fracture: *** -Patient {is;is not an osteoporosis candidate:23886} -Current treatment  *** -Medications previously tried: ***  -{Osteoporosis Counseling:23892} -{CCMPHARMDINTERVENTION:25122}  GERD (Goal: ***) -{US controlled/uncontrolled:25276} -Current treatment  *** -Medications previously tried: ***  -{CCMPHARMDINTERVENTION:25122}  Patient Goals/Self-Care Activities Patient will:  - {pharmacypatientgoals:24919}  Follow Up Plan: {CM FOLLOW UP ELFY:10175}

## 2021-10-12 ENCOUNTER — Ambulatory Visit (INDEPENDENT_AMBULATORY_CARE_PROVIDER_SITE_OTHER): Payer: Medicare Other | Admitting: Pharmacist

## 2021-10-12 DIAGNOSIS — I10 Essential (primary) hypertension: Secondary | ICD-10-CM

## 2021-10-12 DIAGNOSIS — F3342 Major depressive disorder, recurrent, in full remission: Secondary | ICD-10-CM

## 2021-10-12 DIAGNOSIS — E785 Hyperlipidemia, unspecified: Secondary | ICD-10-CM

## 2021-10-12 DIAGNOSIS — M810 Age-related osteoporosis without current pathological fracture: Secondary | ICD-10-CM

## 2021-10-12 NOTE — Patient Instructions (Addendum)
Visit Information ? ? Goals Addressed   ? ?  ?  ?  ?  ? This Visit's Progress  ?  Track and Manage My Blood Pressure-Hypertension     ?  Timeframe:  Long-Range Goal ?Priority:  High ?Start Date:  10/12/21                           ?Expected End Date: 04/14/22                     ? ?Follow Up Date 01/12/22  ?  ?- check blood pressure daily ?- choose a place to take my blood pressure (home, clinic or office, retail store) ?- write blood pressure results in a log or diary  ?  ?Why is this important?   ?You won't feel high blood pressure, but it can still hurt your blood vessels.  ?High blood pressure can cause heart or kidney problems. It can also cause a stroke.  ?Making lifestyle changes like losing a little weight or eating less salt will help.  ?Checking your blood pressure at home and at different times of the day can help to control blood pressure.  ?If the doctor prescribes medicine remember to take it the way the doctor ordered.  ?Call the office if you cannot afford the medicine or if there are questions about it.   ?  ?Notes:  ?  ? ?  ? ?Patient Care Plan: General Pharmacy (Adult)  ?  ? ?Problem Identified: HTN, GERD, Ostopeorosis, Depression, HLD   ?Priority: High  ?Onset Date: 10/12/2021  ?  ? ?Long-Range Goal: Patient-Specific Goal   ?Start Date: 10/12/2021  ?Expected End Date: 04/14/2022  ?This Visit's Progress: On track  ?Priority: High  ?Note:   ?Current Barriers:  ?Possible need to switch statin ? ?Pharmacist Clinical Goal(s):  ?Patient will achieve control of BP as evidenced by monitoring through collaboration with PharmD and provider.  ? ?Interventions: ?1:1 collaboration with [name], MD regarding development and update of comprehensive plan of care as evidenced by provider attestation and co-signature ?Inter-disciplinary care team collaboration (see longitudinal plan of care) ?Comprehensive medication review performed; medication list updated in electronic medical record ? ?Hypertension (BP  goal <130/80) ?-Controlled ?-Current treatment: ?Amlodipine 1.25mg  qpm and additional half tablet if BP > 165/100 Appropriate, Effective, Safe, Accessible ?-Medications previously tried: losartan, metoprolol  ?-Current home readings: 120-140/80-90 ?-Current dietary habits: eats pretty well, watches salt ?-Current exercise habits: walks the dog in the back yard most days ?-Denies hypotensive/hypertensive symptoms ?-Educated on BP goals and benefits of medications for prevention of heart attack, stroke and kidney damage; ?Importance of home blood pressure monitoring; ?Symptoms of hypotension and importance of maintaining adequate hydration; ?-Counseled to monitor BP at home daily, document, and provide log at future appointments ?-Recommended to continue current medication ?Contact providers with consistent blood pressure above goal. ? ?Hyperlipidemia: (LDL goal < 100) ?-Controlled ?-Current treatment: ?Rosuvastatin 40mg  Appropriate, Effective, Query Safe, ?-Medications previously tried: none noted  ?-Educated on Cholesterol goals;  ?Benefits of statin for ASCVD risk reduction; ?Importance of limiting foods high in cholesterol; ?-Most recent CrCl is right at 97ml/min based on Cockroft-Gault calculation. ?-Max dose of 5-10 mg per day of Crestor recommended for CrCl < 30.  Patient needs updated labs to calculate current CrCl - if below 30 could consider switch to Atorvastatin which does not require renal dose adjustments. ? ?Depression/Anxiety (Goal: Minimize symptoms) ?-Controlled ?-Current treatment: ?Duloxetine 30mg   two tabs AM, one tab PM Appropriate, Effective, Safe, Accessible ?-Medications previously tried/failed: lorazepam, zoloft ?-PHQ9:  ?PHQ9 SCORE ONLY 06/04/2021 05/20/2021 02/09/2021  ?PHQ-9 Total Score 0 14 5  ? ?-GAD7:  ?GAD 7 : Generalized Anxiety Score 02/09/2017  ?Nervous, Anxious, on Edge 3  ?Control/stop worrying 3  ?Worry too much - different things 3  ?Trouble relaxing 0  ?Restless 2  ?Easily annoyed  or irritable 2  ?Afraid - awful might happen 3  ?Total GAD 7 Score 16  ?Anxiety Difficulty Very difficult  ? ?-Reports mood stable, sleeping well with Ambien ?-Educated on Benefits of medication for symptom control ?-Recommended to continue current medication ? ?Osteoporosis / Osteopenia (Goal Prevent Fracture) ?-Controlled ?-Last DEXA Scan: 03/23/21  ? T-Score femoral neck: -2.2 ?  ?-Patient is a candidate for pharmacologic treatment due to previous DEXA indicating treatment ?-Current treatment  ?Prolia 60mg  every 6 months Appropriate, Effective, Safe, Accessible ?-Medications previously tried: none noted  ?-Recommend [phone] units of vitamin D daily. Recommend 1200 mg of calcium daily from dietary and supplemental sources. Recommend weight-bearing and muscle strengthening exercises for building and maintaining bone density. ?-Recommended to continue current medication ?Recommended Repeat bone density two years from previous. ? ?Patient Goals/Self-Care Activities ?Patient will:  ?- take medications as prescribed as evidenced by patient report and record review ?focus on medication adherence by pill box ?check blood pressure daily, document, and provide at future appointments ? ?Follow Up Plan: The care management team will reach out to the patient again over the next 365 days.  ? ?  ? ? ?Sandra Craig was given information about Chronic Care Management services today including:  ?CCM service includes personalized support from designated clinical staff supervised by her physician, including individualized plan of care and coordination with other care providers ?24/7 contact phone numbers for assistance for urgent and routine care needs. ?Standard insurance, coinsurance, copays and deductibles apply for chronic care management only during months in which we provide at least 20 minutes of these services. Most insurances cover these services at 100%, however patients may be responsible for any copay, coinsurance and/or  deductible if applicable. This service may help you avoid the need for more expensive face-to-face services. ?Only one practitioner may furnish and bill the service in a calendar month. ?The patient may stop CCM services at any time (effective at the end of the month) by phone call to the office staff. ? ?Patient agreed to services and verbal consent obtained.  ? ?The patient verbalized understanding of instructions, educational materials, and care plan provided today and agreed to receive a mailed copy of patient instructions, educational materials, and care plan.  ?Telephone follow up appointment with pharmacy team member scheduled for: 1 year ? ?Sandra Craig, [facility]  ?Beverly Milch, PharmD ?Clinical Pharmacist  ?[place] [facility] ?([phone] ? ?

## 2021-10-13 NOTE — Progress Notes (Incomplete)
? ?Phone [phone] ?In person visit ?  ?Subjective:  ? ?Sandra Craig is a 81 y.o. year old very pleasant female patient who presents for/with See problem oriented charting ?No chief complaint on file. ? ? ?This visit occurred during the SARS-CoV-2 public health emergency.  Safety protocols were in place, including screening questions prior to the visit, additional usage of staff PPE, and extensive cleaning of exam room while observing appropriate contact time as indicated for disinfecting solutions.  ? ?Past Medical History-  ?Patient Active Problem List  ? Diagnosis Date Noted  ? Senile purpura ([place]) 02/09/2021  ? Memory loss 07/21/2020  ? Facet arthropathy 09/14/2019  ? Vaginal dryness 01/29/2019  ? UTI (urinary tract infection) 12/15/2018  ? History of transient ischemic attack (TIA) 06/02/2017  ? CKD (chronic kidney disease), stage III ([place]) 06/02/2017  ? Near syncope 03/11/2017  ? 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 Chronic right shoulder pain 03/07/2014  ? Chronic low back pain 12/20/2013  ? Benign essential tremor 10/09/2013  ? Hyperglycemia 06/27/2012  ? Diverticulitis large intestine 02/11/2012  ? Irritable bowel syndrome (IBS) 06/28/2011  ? History of small bowel obstruction 06/28/2011  ? GERD with stricture 06/28/2011  ? Polycystic kidney disease   ? Hypertension   ? Hyperlipidemia   ? IC (interstitial cystitis)   ? Osteoporosis 12/21/2009  ? INSOMNIA, CHRONIC 10/08/2008  ? Hemorrhoids 10/08/2008  ? 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 HEMATURIA UNSPECIFIED 07/04/2008  ? Depression 02/08/2008  ? Fibromyalgia 12/05/2007  ? Lung nodules 08/09/2007  ? ? ?Medications- reviewed and updated ?Current Outpatient Medications  ?Medication Sig Dispense Refill  ? amLODipine (NORVASC) 2.5 MG tablet Take 0.5 tablets (1.25 mg total) by mouth in the morning and at bedtime. Always take 1/2 tablet before bed. Can take 1/2 tablet in AM if blood pressure  >165/100 90 tablet 3  ? aspirin 81 MG tablet Take 1 tablet (81 mg total) by mouth daily. 30 tablet   ? calcium-vitamin D 250-100 MG-UNIT tablet Take 1 tablet by mouth 2 (two) times daily.    ? DULoxetine (CYMBALTA) 30 MG capsule TAKE ONE CAPSULE BY MOUTH TWICE DAILY 180 capsule 3  ? famotidine (PEPCID) 20 MG tablet Take 1 tablet (20 mg total) by mouth 2 (two) times daily. 180 tablet 1  ? gabapentin (NEURONTIN) 300 MG capsule TAKE 1 CAPSULE(300 MG) BY MOUTH TWICE DAILY AS NEEDED FOR PAIN 180 capsule 3  ? Multiple Vitamins-Minerals (MULTIVITAMIN WITH MINERALS) tablet Take 1 tablet by mouth daily.    ? Polyethyl Glycol-Propyl Glycol (SYSTANE) 0.4-0.3 % GEL ophthalmic gel Place 1 application into both eyes 3 (three) times daily as needed (dry eye).    ? rosuvastatin (CRESTOR) 40 MG tablet TAKE 1 TABLET(40 MG) BY MOUTH DAILY 90 tablet 3  ? zolpidem (AMBIEN) 5 MG tablet TAKE 1 TABLET BY MOUTH EVERY DAY AT BEDTIME FOR SLEEP 90 tablet 1  ? ?No current facility-administered medications for this visit.  ? ?  ?Objective:  ?LMP 08/25/1992  ?Gen: NAD, resting comfortably ?CV: RRR no murmurs rubs or gallops ?Lungs: CTAB no crackles, wheeze, rhonchi ?Abdomen: soft/nontender/nondistended/normal bowel sounds. No rebound or guarding.  ?Ext: no edema ?Skin: warm, dry ?Neuro: grossly normal, moves all extremities ? ?*** ?  ? ?Assessment and Plan  ? ?***07/31/20 awv ? ?***10/15/19 see renal US- 3-6 month repeat ?***consider rozerem i/o ambien? ? ?***egd ok 09/2019- refer to ENT due to pharynx markings. had positive FIT- ENT was not concerned-  pt and i chatted 11/11/19 ? ?***sister died October 20, 2019 pancreatic cancer. she got married! 21-Mar-202103/21/2022 husband throat cancer ? ?***Never did MRI of the brain from November 11, 2019 ? ?***needs Dexa- when back from [place] ? ?***MMSE next visit ? ?*** ?Kidney cysts are stable. Bosniak class 2 and below do not generally require follow up. We could consider a 1 year follow up MRI or ultrasound to make sure areas  are stable. Can discuss at future visit ? ?*** -Max dose of 5-10 mg per day of Crestor recommended for CrCl < 30. Patient needs updated labs to calculate current CrCl - if below 30 could consider switch to Atorvastatin which does not require renal dose adjustments. ? ?#hypertension ?S: medication: Amlodipine 1.25 mg before bed  - additional 1.25 mg if blood pressure was elevated in the daytime- pretty much not taking bc not rechecking blood pressure ?- recommended to uses extra 1.25  if above 150 or even 165 we had discussed- best for balance between prevented blood pressure from being too high and orthostatic symptoms ?Home readings #s: *** ?BP Readings from Last 3 Encounters:  ?06/04/21 138/86  ?05/20/21 136/78  ?02/09/21 122/80  ?A/P: *** ?  ?#hyperlipidemia ?#history of TIA ?S: Medication:rosuvastatin 40 mg daily and on Aspirin also due to history of TIA  ?Lab Results  ?Component Value Date  ? CHOL 160 07/21/2020  ? HDL 68 07/21/2020  ? [place] 75 07/21/2020  ? LDLDIRECT 59.0 11/03/2020  ? TRIG 89 07/21/2020  ? CHOLHDL 2.4 07/21/2020  ? A/P: *** ? ?# Hyperglycemia/insulin resistance/prediabetes ?S:  Medication: none ?Exercise and diet- *** ?Lab Results  ?Component Value Date  ? HGBA1C 5.9 02/09/2021  ? HGBA1C 5.4 05/15/2020  ? HGBA1C 5.7 10/25/2019  ? ? A/P: *** ?  ?# Depression ?S: Medication:Cymbalta 30 mg twice daily- tried 60 mg in the morning and 30 mg in the evening but felt off/loopy ?-I also encouraged therapist ?-new marriage not going well at all- feels this was a bad mistake but may be able to be fixed . He was not interested in therapy. Cancer outlook for husband is better in remission- still with nonhealing ulcer- still lost weight ?- started getting out of house more and that been very helpful ?-history of alcohol abuse and planned to get back into AA with a friend- still alcohol free ?Depression screen [facility] 2/9 06/04/2021 05/20/2021 02/09/2021  ?Decreased Interest 0 3 1  ?Down, Depressed, Hopeless 0  3 1  ?PHQ - 2 Score 0 6 2  ?Altered sleeping - 0 0  ?Tired, decreased energy - 2 1  ?Change in appetite - 0 1  ?Feeling bad or failure about yourself  - 3 0  ?Trouble concentrating - 3 1  ?Moving slowly or fidgety/restless - 0 0  ?Suicidal thoughts - 0 0  ?PHQ-9 Score - 14 5  ?Difficult doing work/chores - Very difficult Somewhat difficult  ?Some recent data might be hidden  ? ?A/P: *** ? ?# Right lower back pain radiating to RLQ ?S:Had 3 months of symptoms during the 10/22 visit. Aches in right low back and radiated around to the front- right lower quadrant. Prior liver cyst noted up to 1.4 cm- pain was lower than that though- there was no specific follow up given by radiology. Pain was not worsened from when it started. MRI done previously was prior to pain started. Pain up to 4/10- chronic all the time.  We did think this could potentially be radiating from her back  with arthritis history-plan was to consider CT if initial work-up including UA, CBC, CMP unrevealing ?-She ended up having anemia-we ordered stool cards which were positive for blood-have referred to GI for their opinion on neck steps ?- right low back is at baseline. Has not pushed on RLQ lately but reports no clear flares of pain.  ?  ?Also had planned left hernia surgery on November 2nd 2022-patient reported got moved back to end of month ?- she was concerned about hernia site as well- requested [place] of RLQ prior to upcoming left abdomen wall surgeyr for hernia ?-- encouraged GI follow up though they may want to hold off on repeat at her age- already had 2021- did have many polyps ?A/P: *** ?  ?# seen nephrology who ended up sending her to urology- they said low risk cysts-now 3 month follow up planned with Dr. Jerl Mina months from last visit ?  ?#hernia on right repair did well but wonders if RLQ could be related to this- still going to have to have the left- scheduled November 2nd, 2022 - may have to get pushed back with husbands situation ?   ? ?Health Maintenance Due  ?Topic Date Due  ? Zoster Vaccines- Shingrix (1 of 2) Never done  ? COVID-19 Vaccine (5 - Booster for Pfizer series) 06/09/2021  ? ?Recommended follow up: No follow-ups on file. ?Future

## 2021-10-20 ENCOUNTER — Other Ambulatory Visit: Payer: Self-pay | Admitting: Family Medicine

## 2021-10-22 ENCOUNTER — Ambulatory Visit: Payer: Medicare Other

## 2021-10-22 ENCOUNTER — Other Ambulatory Visit: Payer: Self-pay

## 2021-10-25 ENCOUNTER — Ambulatory Visit (INDEPENDENT_AMBULATORY_CARE_PROVIDER_SITE_OTHER): Payer: Medicare Other | Admitting: Psychology

## 2021-10-25 DIAGNOSIS — F331 Major depressive disorder, recurrent, moderate: Secondary | ICD-10-CM

## 2021-10-25 NOTE — Progress Notes (Signed)
[place] Counselor Initial Adult Exam ? ?Name: Sandra Craig ?Date: 10/25/2021 ?MRN: [id number] ?DOB: 10-01-1940 ?PCP: [name], MD ? ?Time spent: 11:00am-11:55am   55 minutes ? ?Guardian/Payee:  n/a   ? ?Paperwork requested: No  ? ?Reason for Visit /Presenting Problem: Pt present for face-to-face initial assessment via video Webex.  Pt consents to telehealth video session due to COVID 19 pandemic. ?Location of pt: home ?Location of therapist: home office.  ?Pt has had symptoms of depression and anxiety.   Pt has a history of depression.   ?Pt states she has been having a lot of physical issues.   Pt has chronic pain issues.  She hurts all day.  She is able to sleep with taking ambien.  Pt has itching all over her body.   It feels like her hands have been burned.   ?Pt got married about a year and half ago and is not happy in her marriage.   Pt moved from her home of 11 years to her husband's house last year.  She feels trapped and stuck.   Pt state her husband [name] does not change anything he was doing before she lived with him.  He plays the tv very loud which really bothers pt.   They got a new dog a year ago.  The dog needs training and pt and husband disagree about training styles.  They have conflict. Pt's husband had throat cancer that he has been treated for.   Pt worries bc she had to sign a prenup agreement and if her husband dies would have to move out of the house within 6 months.  Nothing is left to pt if her husband dies.  Pt feels resentful about this.  Pt does not feel at home in her husband's house.   ?Pt lost 2 siblings in the past couple of years.   Pt was very close to them.  Her sister was her best friend and her death was a big loss for pt.  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  Pt attends AA. ?Pt's best friend has moved to assisted living and is not as accessible.   ? ? ?Mental Status Exam: ?Appearance:   Casual     ?Behavior:   Appropriate  ?Motor:  Normal  ?Speech/Language:   Normal Rate  ?Affect:  Appropriate  ?Mood:  normal  ?Thought process:  normal  ?Thought content:    WNL  ?Sensory/Perceptual disturbances:    WNL  ?Orientation:  oriented to person, place, time/date, and situation  ?Attention:  Good  ?Concentration:  Good  ?Memory:  WNL  ?Fund of knowledge:   Good  ?Insight:    Good  ?Judgment:   Good  ?Impulse Control:  Good  ? ? ?Reported Symptoms:  anxiety, sadness ? ?Risk Assessment: ?Danger to Self:  No ?Self-injurious Behavior: No ?Danger to Others: No ?Duty to Warn:no ?Physical Aggression / Violence:No  ?Access to Firearms a concern: No  ?Gang Involvement:No  ?Patient / guardian was educated about steps to take if suicide or homicide risk level increases between visits: n/a ?While future psychiatric events cannot be accurately predicted, the patient does not currently require acute inpatient psychiatric care and does not currently meet [facility] involuntary commitment criteria. ? ?Substance Abuse History: ?Current substance abuse: No    ? ?Past Psychiatric History:   ?Previous psychological history is significant for depression ?Outpatient Providers:pt has been in therapy in the past. ?History of  Psych Hospitalization: No  ?Psychological Testing:  n/a   ? ?Abuse History:  ?Victim of: No.,  n/a    ?Report needed: No. ?Victim of Neglect:No. ?Perpetrator of  n/a   ?Witness / Exposure to Domestic Violence: No   ?Protective Services Involvement: No  ?Witness to [employer] Violence:  No  ? ?Family History:  ?Family History  ?Problem Relation Age of Onset  ? Heart disease Mother   ?     MI at age 57  ? Emphysema Mother   ? Thyroid disease Mother   ?     Thyroidectomy/Benign  ? Lymphoma Maternal Grandmother   ? Colon cancer Paternal Grandmother   ? COPD Father   ? Cancer Brother   ?     adenocarcinoma right lung  ? COPD Brother   ? Heart attack Paternal Grandfather   ? Pancreatic cancer Sister   ?     died 66  ? Esophageal cancer  Neg Hx   ? Rectal cancer Neg Hx   ? Stomach cancer Neg Hx   ? ? ?Living situation: the patient lives with her spouse. ? ?Pt grew up with both parents.  Pt is the oldest of 6 kids.  Pt's father worked out of town a lot.  Pt's mother was "high strung". ?Family history of mental health issues:  mother had anxiety.  Pt's mother was alcoholic. ?No abuse ?Pt's mother passed away at 64 years old.   ? ?Sexual Orientation: Straight ? ?Relationship Status: married  ?Name of spouse / other:[name].  [name] is a Civil engineer, contracting and still works.  ?If a parent, number of children / ages:none ?This is pt's 3rd marriage.   Her first marriage ended in divorce after 7 years and the second husband passed away in 2010-10-28 after they were married for 34 years.    ?Support Systems: spouse ?friends ? ?Financial Stress:  No  ? ?Income/Employment/Disability: Social Security Retirement ? ?Military Service: No  ? ?Educational History: ?Education: college graduate ? ?Religion/Sprituality/World View: ?Protestant ? ?Any cultural differences that may affect / interfere with treatment:  not applicable  ? ?Recreation/Hobbies: reading ? ?Stressors: Health problems   ?Marital or family conflict   ? ?Strengths: Friends, Conservator, museum/gallery, and Able to Communicate Effectively ? ?Barriers:  none  ? ?Legal History: ?Pending legal issue / charges: The patient has no significant history of legal issues. ?History of legal issue / charges:  n/a ? ?Medical History/Surgical History: reviewed ?Past Medical History:  ?Diagnosis Date  ? Arthritis   ? 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 Chronic insomnia   ? Cystitis   ? Depression   ? Diverticulitis of intestine without perforation or abscess without bleeding   ? Patient did have abscess but noperforation  ? Diverticulosis of colon (without mention of hemorrhage)   ? Endometriosis   ? Family history of malignant neoplasm of gastrointestinal tract   ? Fibromyalgia   ? Gastritis   ? Hiatal hernia   ?  HIATAL HERNIA 10-27-08  ? Qualifier: Diagnosis of  By: Nils Pyle CMA Deborra Medina), Mearl Latin    ? History of gallstones   ? Hyperlipidemia   ? Hypertension   ? IBS (irritable bowel syndrome)   ? IC (interstitial cystitis)   ? Internal hemorrhoid   ? Osteonecrosis ([place])   ? Osteopenia   ? Osteoporosis   ? Palpitations   ? Polycystic kidney disease   ? Pulmonary nodule 12/08  ? 5 mm Anterior RUL  ? Small bowel obstruction (  [place])   ? Stroke [facility])   ? TIA  ? ? ?Past Surgical History:  ?Procedure Laterality Date  ? ABDOMINAL HYSTERECTOMY  1994  ? TAH,BSO FOR ENDOMETRIOSIS  ? ABDOMINAL SURGERY  2011  ? small intestine blockage  ? CHOLECYSTECTOMY    ? GASTROPLASTY  2011  ? small bowel resection -open  ? INGUINAL HERNIA REPAIR Right 02/28/2020  ? Procedure: LAPAROSCOPIC RIGHT INGUINAL HERNIA REPAIR WITH MESH;  Surgeon: Ralene Ok, MD;  Location: [facility];  Service: General;  Laterality: Right;  ? JOINT REPLACEMENT  2008  ? OOPHORECTOMY  1994  ? TAH,BSO  ? PELVIC LAPAROSCOPY    ? S/P right shoulder rotater cuff  200216/2011  ? Tear/adhesive capsulitis  ? SBO Lap  11  ? Adhesions and small internal hernia  ? SHOULDER HEMI-ARTHROPLASTY  06/05/2012  ? Procedure: SHOULDER HEMI-ARTHROPLASTY;  Surgeon: Johnny Bridge, MD;  Location: [facility];  Service: Orthopedics;  Laterality: Right;  FOR ARTHRITIS  ? TOTAL HIP ARTHROPLASTY  FALL OF 2008  ? rt. partial hip replacement  ? TOTAL SHOULDER ARTHROPLASTY  06/05/2012  ? Procedure: TOTAL SHOULDER ARTHROPLASTY;  Surgeon: Johnny Bridge, MD;  Location: [facility];  Service: Orthopedics;  Laterality: Right;  RIGHT SHOULDER TOTAL ARTHROPLASTY, HEMIARTHROPLASTY, SHOULDER, FOR ARTHRITIS  ? VIDEO BRONCHOSCOPY Bilateral 01/28/2015  ? Procedure: VIDEO BRONCHOSCOPY WITH FLUORO;  Surgeon: Collene Gobble, MD;  Location: [facility];  Service: Cardiopulmonary;  Laterality: Bilateral;  ? VIDEO BRONCHOSCOPY Bilateral 04/23/2019  ? Procedure: VIDEO BRONCHOSCOPY WITHOUT FLUORO;  Surgeon: Collene Gobble, MD;  Location: [facility]  ENDOSCOPY;  Service: Cardiopulmonary;  Laterality: Bilateral;  ? ? ?Medications: ?Current Outpatient Medications  ?Medication Sig Dispense Refill  ? amLODipine (NORVASC) 2.5 MG tablet Take 0.5 tablets (1.25 mg tot

## 2021-10-29 DIAGNOSIS — F3342 Major depressive disorder, recurrent, in full remission: Secondary | ICD-10-CM

## 2021-10-29 DIAGNOSIS — I10 Essential (primary) hypertension: Secondary | ICD-10-CM | POA: Diagnosis not present

## 2021-10-29 DIAGNOSIS — M810 Age-related osteoporosis without current pathological fracture: Secondary | ICD-10-CM | POA: Diagnosis not present

## 2021-10-29 DIAGNOSIS — E785 Hyperlipidemia, unspecified: Secondary | ICD-10-CM

## 2021-11-02 ENCOUNTER — Encounter: Payer: Self-pay | Admitting: Family Medicine

## 2021-11-02 ENCOUNTER — Ambulatory Visit (INDEPENDENT_AMBULATORY_CARE_PROVIDER_SITE_OTHER): Payer: Medicare Other | Admitting: Family Medicine

## 2021-11-02 VITALS — BP 138/76 | HR 78 | Temp 98.6°F | Ht 65.75 in | Wt 145.6 lb

## 2021-11-02 DIAGNOSIS — R350 Frequency of micturition: Secondary | ICD-10-CM | POA: Diagnosis not present

## 2021-11-02 DIAGNOSIS — K5792 Diverticulitis of intestine, part unspecified, without perforation or abscess without bleeding: Secondary | ICD-10-CM | POA: Diagnosis not present

## 2021-11-02 LAB — POC URINALSYSI DIPSTICK (AUTOMATED)
Bilirubin, UA: NEGATIVE
Blood, UA: NEGATIVE
Glucose, UA: NEGATIVE
Ketones, UA: NEGATIVE
Leukocytes, UA: NEGATIVE
Nitrite, UA: NEGATIVE
Protein, UA: NEGATIVE
Spec Grav, UA: 1.01 (ref 1.010–1.025)
Urobilinogen, UA: 0.2 E.U./dL
pH, UA: 6 (ref 5.0–8.0)

## 2021-11-02 MED ORDER — HYDROCORTISONE ACETATE 25 MG RE SUPP: 25.0000 mg | Freq: Two times a day (BID) | RECTAL | 1 refills | Status: DC

## 2021-11-02 MED ORDER — AMOXICILLIN-POT CLAVULANATE 875-125 MG PO TABS: 1.0000 | ORAL_TABLET | Freq: Two times a day (BID) | ORAL | 0 refills | Status: DC

## 2021-11-02 NOTE — Progress Notes (Signed)
?Phone [phone] ?In person visit ?  ?Subjective:  ? ?Sandra Craig is a 81 y.o. year old very pleasant female patient who presents for/with See problem oriented charting ?Chief Complaint  ?Patient presents with  ? Pelvic Pain  ?  Pt c/o pelvic pain that started 2 weeks ago and c/o frequent urination and feels it more on the lower left side. She denies n/v.   ? ?This visit occurred during the SARS-CoV-2 public health emergency.  Safety protocols were in place, including screening questions prior to the visit, additional usage of staff PPE, and extensive cleaning of exam room while observing appropriate contact time as indicated for disinfecting solutions.  ? ?Past Medical History-  ?Patient Active Problem List  ? Diagnosis Date Noted  ? History of transient ischemic attack (TIA) 06/02/2017  ?  Priority: High  ? 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  Priority: High  ? Chronic low back pain 12/20/2013  ?  Priority: High  ? Fibromyalgia 12/05/2007  ?  Priority: High  ? UTI (urinary tract infection) 12/15/2018  ?  Priority: Medium   ? CKD (chronic kidney disease), stage III ([place]) 06/02/2017  ?  Priority: Medium   ? Near syncope 03/11/2017  ?  Priority: Medium   ? History of small bowel obstruction 06/28/2011  ?  Priority: Medium   ? GERD with stricture 06/28/2011  ?  Priority: Medium   ? Polycystic kidney disease   ?  Priority: Medium   ? Hypertension   ?  Priority: Medium   ? Hyperlipidemia   ?  Priority: Medium   ? Osteoporosis 12/21/2009  ?  Priority: Medium   ? INSOMNIA, CHRONIC 10/08/2008  ?  Priority: Medium   ? Depression 02/08/2008  ?  Priority: Medium   ? Facet arthropathy 09/14/2019  ?  Priority: Low  ? Vaginal dryness 01/29/2019  ?  Priority: Low  ? Chronic right shoulder pain 03/07/2014  ?  Priority: Low  ? Benign essential tremor 10/09/2013  ?  Priority: Low  ? Hyperglycemia 06/27/2012  ?  Priority: Low  ? Diverticulitis large intestine 02/11/2012  ?  Priority: Low  ?  Irritable bowel syndrome (IBS) 06/28/2011  ?  Priority: Low  ? IC (interstitial cystitis)   ?  Priority: Low  ? Hemorrhoids 10/08/2008  ?  Priority: Low  ? 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  Priority: Low  ? HEMATURIA UNSPECIFIED 07/04/2008  ?  Priority: Low  ? Lung nodules 08/09/2007  ?  Priority: Low  ? Senile purpura ([place]) 02/09/2021  ? Memory loss 07/21/2020  ? ? ?Medications- reviewed and updated ?Current Outpatient Medications  ?Medication Sig Dispense Refill  ? amLODipine (NORVASC) 2.5 MG tablet Take 0.5 tablets (1.25 mg total) by mouth in the morning and at bedtime. Always take 1/2 tablet before bed. Can take 1/2 tablet in AM if blood pressure >165/100 90 tablet 3  ? amoxicillin-clavulanate (AUGMENTIN) 875-125 MG tablet Take 1 tablet by mouth 2 (two) times daily. 20 tablet 0  ? aspirin 81 MG tablet Take 1 tablet (81 mg total) by mouth daily. 30 tablet   ? calcium-vitamin D 250-100 MG-UNIT tablet Take 1 tablet by mouth 2 (two) times daily.    ? DULoxetine (CYMBALTA) 30 MG capsule TAKE ONE CAPSULE BY MOUTH TWICE DAILY 180 capsule 3  ? famotidine (PEPCID) 20 MG tablet Take 1 tablet (20 mg total) by mouth 2 (two) times daily. 180 tablet 1  ? gabapentin (  NEURONTIN) 300 MG capsule TAKE 1 CAPSULE(300 MG) BY MOUTH TWICE DAILY AS NEEDED FOR PAIN 180 capsule 3  ? hydrocortisone (ANUSOL-HC) 25 MG suppository Place 1 suppository (25 mg total) rectally 2 (two) times daily. For hemorrhoids internal 12 suppository 1  ? Multiple Vitamins-Minerals (MULTIVITAMIN WITH MINERALS) tablet Take 1 tablet by mouth daily.    ? Polyethyl Glycol-Propyl Glycol (SYSTANE) 0.4-0.3 % GEL ophthalmic gel Place 1 application into both eyes 3 (three) times daily as needed (dry eye).    ? rosuvastatin (CRESTOR) 40 MG tablet TAKE 1 TABLET(40 MG) BY MOUTH DAILY 90 tablet 3  ? zolpidem (AMBIEN) 5 MG tablet TAKE 1 TABLET BY MOUTH EVERY DAY AT BEDTIME FOR SLEEP 90 tablet 1  ? ?No current facility-administered medications for  this visit.  ? ?  ?Objective:  ?BP 138/76   Pulse 78   Temp 98.6 ?F (37 ?C)   Ht 5' 5.75" (1.67 m)   Wt 145 lb 9.6 oz (66 kg)   LMP 08/25/1992   SpO2 96%   BMI 23.68 kg/m?  ?Gen: NAD, resting comfortably ?CV: RRR no murmurs rubs or gallops ?Lungs: CTAB no crackles, wheeze, rhonchi ?Abdomen: soft/nontender throughout abdomen except pinpoint tenderness in left lower quadrant and appears to be in severe pain in this area/nondistended/normal bowel sounds. No rebound or guarding.  ?Ext: no edema ?Skin: warm, dry ? ?Results for orders placed or performed in visit on 11/02/21 (from the past 24 hour(s))  ?POCT Urinalysis Dipstick (Automated)     Status: Normal  ? Collection Time: 11/02/21 11:44 AM  ?Result Value Ref Range  ? Color, UA yellow   ? Clarity, UA clear   ? Glucose, UA Negative Negative  ? Bilirubin, UA neg   ? Ketones, UA neg   ? Spec Grav, UA 1.010 1.010 - 1.025  ? Blood, UA neg   ? pH, UA 6.0 5.0 - 8.0  ? Protein, UA Negative Negative  ? Urobilinogen, UA 0.2 0.2 or 1.0 E.U./dL  ? Nitrite, UA neg   ? Leukocytes, UA Negative Negative  ? ? ?  ? ?Assessment and Plan  ? ?# Left lower quadrant abdominal pain ?S: Patient complains of LLQ pain hat started about 2 weeks ago.  She has noted frequent urination.  Pain seems concentrated in the left lower quadrant- varies from 2/10 up to 5/10. Marland Kitchen  No nausea or vomiting reported.  No dysuria. No fever. Does have some urinary urgency.  ? ?On last colonoscopy did have diverticulosis noted. History of diverticulitis twice as well. History of hysterectomy with BSO. No vaginal bleeding or discharge- she thinks no longer has cervix.  ? ?A/P: 81 year old female with history of diverticulitis as well as history of hysterectomy with BSO presenting with left lower quadrant pain and reassuring urinalysis.  We will still get urine culture to rule out UTI but with pinpoint left lower quadrant pain more strongly suspect diverticulitis which she has had in the past ? ?From AVS:  ?" -  Recommend a liquid diet over the next 2 to 3 days to see if this helps ?- If not improving by Thursday I want you to start antibiotics I sent in but I also want you to update me at any point where you start antibiotics (even if you do that next week for instance if improving then worsen) ?-urine cultures sometimes pop positive in 2 days even if initial urine is normal so that is another thing we may have to treat ? ?If you have  significantly worsening symptoms such as fever, nausea, vomiting, significant worsening of pain please seek care in the hospital ? " ? ?#Left arm pins and needles month or two months ago- saw Dr. Mardelle Matte and had neck films- significant arthritis with bulging disc- not much could be done at that time per her report- she reports overall improving but has some itch ? ?#Internal hemorrhoids-patient reports several weeks ago had some constipation and noted internal hemorrhoids and when the bulge out or painful-I sent in some hydrocortisone suppositories for her if recurrent issues ? 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 ?Recommended follow up: Return for as needed for new, worsening, persistent symptoms.  Otherwise keep May visit ?Future Appointments  ?Date Time Provider [place]  ?11/04/2021 12:00 PM Bauert, Terri W, LCSW [facility] None  ?12/03/2021  2:00 PM [name], MD [facility]  ?12/28/2021  2:00 PM Bauert, Nicolasa Ducking, LCSW [facility] None  ?10/24/2022  3:00 PM [facility] PHARMACIST [facility]  ? ? ?Lab/Order associations: ?  ICD-10-CM   ?1. Diverticulitis  K57.92   ?  ?2. Frequent urination  R35.0 Urine Culture  ?  POCT Urinalysis Dipstick (Automated)  ?  Urine Culture  ?  ? ? ?Meds ordered this encounter  ?Medications  ? amoxicillin-clavulanate (AUGMENTIN) 875-125 MG tablet  ?  Sig: Take 1 tablet by mouth 2 (two) times daily.  ?  Dispense:  20 tablet  ?  Refill:  0  ? hydrocortisone (ANUSOL-HC) 25 MG suppository  ?  Sig: Place 1  suppository (25 mg total) rectally 2 (two) times daily. For hemorrhoids internal  ?  Dispense:  12 suppository  ?  Refill:  1  ? ? Time Spent: ?32 minutes of total time (11:48 PM-12:20 PM) was spent on the

## 2021-11-02 NOTE — Patient Instructions (Addendum)
Let us know when you get your [facility] shot at the pharmacy. ? ?With your history of diverticulitis and very pinpoint tenderness in the left lower quadrant today I strongly suspect recurrent diverticulitis ?- Recommend a liquid diet over the next 2 to 3 days to see if this helps ?- If not improving by Thursday I want you to start antibiotics I sent in but I also want you to update me at any point where you start antibiotics (even if you do that next week for instance if improving then worsen) ?-urine cultures sometimes pop positive in 2 days even if initial urine is normal so that is another thing we may have to treat ? ?If you have significantly worsening symptoms such as fever, nausea, vomiting, significant worsening of pain please seek care in the hospital ? ? ?Recommended follow up: Return for as needed for new, worsening, persistent symptoms.  ? ?

## 2021-11-03 LAB — URINE CULTURE
MICRO NUMBER:: 13219990
SPECIMEN QUALITY:: ADEQUATE

## 2021-11-04 ENCOUNTER — Ambulatory Visit (INDEPENDENT_AMBULATORY_CARE_PROVIDER_SITE_OTHER): Payer: Medicare Other | Admitting: Psychology

## 2021-11-04 DIAGNOSIS — F331 Major depressive disorder, recurrent, moderate: Secondary | ICD-10-CM | POA: Diagnosis not present

## 2021-11-04 NOTE — Progress Notes (Signed)
[facility] Counselor/Therapist Progress Note ? ?Patient ID: AIVY AKTER, MRN: [id number],   ? ?Date: 11/04/2021 ? ?Time Spent: 12:00pm - 12:55pm  55 minutes  ? ?Treatment Type: Individual Therapy ? ?Reported Symptoms: confusion, loneliness ? ?Mental Status Exam: ?Appearance:  Casual     ?Behavior: Appropriate  ?Motor: Normal  ?Speech/Language:  Normal Rate  ?Affect: Appropriate  ?Mood: normal  ?Thought process: normal  ?Thought content:   WNL  ?Sensory/Perceptual disturbances:   WNL  ?Orientation: oriented to person, place, time/date, and situation  ?Attention: Good  ?Concentration: Good  ?Memory: WNL  ?Fund of knowledge:  Good  ?Insight:   Good  ?Judgment:  Good  ?Impulse Control: Good  ? ?Risk Assessment: ?Danger to Self:  No ?Self-injurious Behavior: No ?Danger to Others: No ?Duty to Warn:no ?Physical Aggression / Violence:No  ?Access to Firearms a concern: No  ?Gang Involvement:No  ? ?Subjective: Pt present for individual therapy in person. ?Pt talked about feeling confused a lot.  She is concerned about her memory.  She feels her memory has gotten worse in the past year and a half.   ?Pt talked about regretting that she married her husband.  Pt misses the condo where she use to live.  Pt felt safe and comfortable there.  Pt's husband's house is too big and needs a lot of work.  Pt feels like she can not keep up with the house.    ?Pt feels "insecure and less than".  She does not stand up for herself with [name].  Pt feels lonely in the marriage bc [name] does not pay much attention to her.  He watches sports and stays in front of the television most of the time.  [name] also still works a few days a week.   ?Pt does not feel like she is very important to [employer].  Pt feels resentful about their financial arrangement.  Helped pt process her feelings and relationship dynamics.   Pt has thought about leaving the marriage but the logistics are difficult to navigate.  Pt told  [name] she has looked at apartments bc she is unhappy.  ?Pt has been sober for 46 years.  She has started going back to [place] meetings bc it is a good support system for her.   ?Pt has little support from family.  She may consider going to an assisted living place that her sister lives in.    ?Provided supportive therapy.   ? ?Interventions: Cognitive Behavioral Therapy and Insight-Oriented ? ?Diagnosis: F33.1 ? ?Plan: Plan to meet in two weeks.  Pt is progressing toward treatment goals.  ? ?Plan of Care: Recommend ongoing therapy.   Pt participated in setting treatment goals.  Pt wants to have someone to talk to and to improve coping skills.  Pt wants to feel less anxious and depressed.  Plan to meet every two weeks.   ? ?Treatment Plan (Treatment Plan Target Date: 10/26/2022) ?Client Abilities/Strengths  ?Pt is bright, engaging, and motivated for therapy.   ?Client Treatment Preferences  ?Individual therapy.  ?Client Statement of Needs  ?Improve coping skills.  ?Symptoms  ?Depressed or irritable mood. ?Diminished interest in or enjoyment of activities. ?Lack of energy. ?Feelings of hopelessness, worthlessness, or inappropriate guilt. ?Unresolved grief issues. ? ? ?Problems Addressed  ?Unipolar Depression ?Goals ?1. Alleviate depressive symptoms and return to previous level of effective functioning. ?2. Appropriately grieve the loss in order to normalize mood and to return to previously adaptive level of functioning. ?Objective ?Learn  and implement behavioral strategies to overcome depression. ?Target Date: 2022-10-26 Frequency: Biweekly  ?Progress: 10 Modality: individual  ?Related Interventions ?Engage the client in "behavioral activation," increasing his/her activity level and contact with sources of reward, while identifying processes that inhibit activation.  Use behavioral techniques such as instruction, rehearsal, role-playing, role reversal, as needed, to facilitate activity in the client's daily life;  reinforce success. ?Assist the client in developing skills that increase the likelihood of deriving pleasure from behavioral activation (e.g., assertiveness skills, developing an exercise plan, less internal/more external focus, increased social involvement); reinforce success. ?Objective ?Identify important people in life, past and present, and describe the quality, good and poor, of those relationships. ?Target Date: 2022-10-26 Frequency: Biweekly  ?Progress: 10 Modality: individual  ?Related Interventions ?Conduct Interpersonal Therapy beginning with the assessment of the client's "interpersonal inventory" of important past and present relationships; develop a case formulation linking depression to grief, interpersonal role disputes, role transitions, and/or interpersonal deficits). ?Objective ?Learn and implement problem-solving and decision-making skills. ?Target Date: 2022-10-26 Frequency: Biweekly  ?Progress: 10 Modality: individual  ?Related Interventions ?Conduct Problem-Solving Therapy using techniques such as psychoeducation, modeling, and role-playing to teach client problem-solving skills (i.e., defining a problem specifically, generating possible solutions, evaluating the pros and cons of each solution, selecting and implementing a plan of action, evaluating the efficacy of the plan, accepting or revising the plan); role-play application of the problem-solving skill to a real life issue. ?Encourage in the client the development of a positive problem orientation in which problems and solving them are viewed as a natural part of life and not something to be feared, despaired, or avoided. ?3. Develop healthy interpersonal relationships that lead to the alleviation and help prevent the relapse of depression. ?4. Develop healthy thinking patterns and beliefs about self, others, and the world that lead to the alleviation and help prevent the relapse of depression. ?5. Recognize, accept, and cope with  feelings of depression. ?Diagnosis ?F33.1  ?Conditions For Discharge ?Achievement of treatment goals and objectives  ? ?Tregan Read, LCSW ? ? ? ?

## 2021-11-17 ENCOUNTER — Other Ambulatory Visit: Payer: Self-pay | Admitting: Family Medicine

## 2021-12-03 ENCOUNTER — Ambulatory Visit: Payer: Medicare Other | Admitting: Family Medicine

## 2021-12-03 DIAGNOSIS — F3342 Major depressive disorder, recurrent, in full remission: Secondary | ICD-10-CM

## 2021-12-03 DIAGNOSIS — R739 Hyperglycemia, unspecified: Secondary | ICD-10-CM

## 2021-12-03 DIAGNOSIS — I10 Essential (primary) hypertension: Secondary | ICD-10-CM

## 2021-12-03 DIAGNOSIS — E785 Hyperlipidemia, unspecified: Secondary | ICD-10-CM

## 2021-12-07 ENCOUNTER — Encounter: Payer: Self-pay | Admitting: Family Medicine

## 2021-12-07 ENCOUNTER — Telehealth: Payer: Self-pay | Admitting: *Deleted

## 2021-12-07 ENCOUNTER — Ambulatory Visit (INDEPENDENT_AMBULATORY_CARE_PROVIDER_SITE_OTHER): Payer: Medicare Other | Admitting: Family Medicine

## 2021-12-07 VITALS — BP 122/80 | HR 74 | Temp 98.2°F | Ht 65.75 in | Wt 144.0 lb

## 2021-12-07 DIAGNOSIS — I10 Essential (primary) hypertension: Secondary | ICD-10-CM

## 2021-12-07 DIAGNOSIS — E785 Hyperlipidemia, unspecified: Secondary | ICD-10-CM

## 2021-12-07 DIAGNOSIS — N183 Chronic kidney disease, stage 3 unspecified: Secondary | ICD-10-CM

## 2021-12-07 DIAGNOSIS — D692 Other nonthrombocytopenic purpura: Secondary | ICD-10-CM

## 2021-12-07 DIAGNOSIS — R739 Hyperglycemia, unspecified: Secondary | ICD-10-CM

## 2021-12-07 DIAGNOSIS — F3342 Major depressive disorder, recurrent, in full remission: Secondary | ICD-10-CM

## 2021-12-07 DIAGNOSIS — Z8673 Personal history of transient ischemic attack (TIA), and cerebral infarction without residual deficits: Secondary | ICD-10-CM

## 2021-12-07 LAB — LIPID PANEL
Cholesterol: 144 mg/dL (ref 0–200)
HDL: 62.1 mg/dL (ref >39.00)
LDL Cholesterol: 63 mg/dL (ref 0–99)
NonHDL: 82.12
Total CHOL/HDL Ratio: 2
Triglycerides: 97 mg/dL (ref 0.0–149.0)
VLDL: 19.4 mg/dL (ref 0.0–40.0)

## 2021-12-07 LAB — CBC WITH DIFFERENTIAL/PLATELET
Basophils Absolute: 0.1 10*3/uL (ref 0.0–0.1)
Basophils Relative: 0.9 % (ref 0.0–3.0)
Eosinophils Absolute: 0.1 10*3/uL (ref 0.0–0.7)
Eosinophils Relative: 1.8 % (ref 0.0–5.0)
HCT: 37.6 % (ref 36.0–46.0)
Hemoglobin: 12.2 g/dL (ref 12.0–15.0)
Lymphocytes Relative: 28 % (ref 12.0–46.0)
Lymphs Abs: 1.6 10*3/uL (ref 0.7–4.0)
MCHC: 32.5 g/dL (ref 30.0–36.0)
MCV: 90.3 fl (ref 78.0–100.0)
Monocytes Absolute: 0.4 10*3/uL (ref 0.1–1.0)
Monocytes Relative: 7.4 % (ref 3.0–12.0)
Neutro Abs: 3.5 10*3/uL (ref 1.4–7.7)
Neutrophils Relative %: 61.9 % (ref 43.0–77.0)
Platelets: 190 10*3/uL (ref 150.0–400.0)
RBC: 4.16 Mil/uL (ref 3.87–5.11)
RDW: 16.1 % — ABNORMAL HIGH (ref 11.5–15.5)
WBC: 5.7 10*3/uL (ref 4.0–10.5)

## 2021-12-07 LAB — COMPREHENSIVE METABOLIC PANEL
ALT: 55 U/L — ABNORMAL HIGH (ref 0–35)
AST: 50 U/L — ABNORMAL HIGH (ref 0–37)
Albumin: 3.9 g/dL (ref 3.5–5.2)
Alkaline Phosphatase: 59 U/L (ref 39–117)
BUN: 24 mg/dL — ABNORMAL HIGH (ref 6–23)
CO2: 30 mEq/L (ref 19–32)
Calcium: 9 mg/dL (ref 8.4–10.5)
Chloride: 106 mEq/L (ref 96–112)
Creatinine, Ser: 1.59 mg/dL — ABNORMAL HIGH (ref 0.40–1.20)
GFR: 30.7 mL/min — ABNORMAL LOW (ref >60.00)
Glucose, Bld: 88 mg/dL (ref 70–99)
Potassium: 4.2 mEq/L (ref 3.5–5.1)
Sodium: 144 mEq/L (ref 135–145)
Total Bilirubin: 0.4 mg/dL (ref 0.2–1.2)
Total Protein: 6.8 g/dL (ref 6.0–8.3)

## 2021-12-07 LAB — HEMOGLOBIN A1C: Hgb A1c MFr Bld: 5.8 % (ref 4.6–6.5)

## 2021-12-07 NOTE — Progress Notes (Signed)
?Phone [phone] ?In person visit ?  ?Subjective:  ? ?Sandra Craig is a 81 y.o. year old very pleasant female patient who presents for/with See problem oriented charting ?Chief Complaint  ?Patient presents with  ? Follow-up  ?  6 month follow-up ?Fasting ?Bilateral legs have bruises due to itching/scratching ?  ? ? ?Past Medical History-  ?Patient Active Problem List  ? Diagnosis Date Noted  ? History of transient ischemic attack (TIA) 06/02/2017  ?  Priority: High  ? 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  Priority: High  ? Chronic low back pain 12/20/2013  ?  Priority: High  ? Fibromyalgia 12/05/2007  ?  Priority: High  ? UTI (urinary tract infection) 12/15/2018  ?  Priority: Medium   ? CKD (chronic kidney disease), stage III ([place]) 06/02/2017  ?  Priority: Medium   ? Near syncope 03/11/2017  ?  Priority: Medium   ? History of small bowel obstruction 06/28/2011  ?  Priority: Medium   ? GERD with stricture 06/28/2011  ?  Priority: Medium   ? Polycystic kidney disease   ?  Priority: Medium   ? Hypertension   ?  Priority: Medium   ? Hyperlipidemia   ?  Priority: Medium   ? Osteoporosis 12/21/2009  ?  Priority: Medium   ? INSOMNIA, CHRONIC 10/08/2008  ?  Priority: Medium   ? Depression 02/08/2008  ?  Priority: Medium   ? Facet arthropathy 09/14/2019  ?  Priority: Low  ? Vaginal dryness 01/29/2019  ?  Priority: Low  ? Chronic right shoulder pain 03/07/2014  ?  Priority: Low  ? Benign essential tremor 10/09/2013  ?  Priority: Low  ? Hyperglycemia 06/27/2012  ?  Priority: Low  ? Diverticulitis large intestine 02/11/2012  ?  Priority: Low  ? Irritable bowel syndrome (IBS) 06/28/2011  ?  Priority: Low  ? IC (interstitial cystitis)   ?  Priority: Low  ? Hemorrhoids 10/08/2008  ?  Priority: Low  ? 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  Priority: Low  ? HEMATURIA UNSPECIFIED 07/04/2008  ?  Priority: Low  ? Lung nodules 08/09/2007  ?  Priority: Low  ? Senile purpura  ([place]) 02/09/2021  ? Memory loss 07/21/2020  ? ? ?Medications- reviewed and updated ?Current Outpatient Medications  ?Medication Sig Dispense Refill  ? amLODipine (NORVASC) 2.5 MG tablet TAKE 1/2 TABLET BY MOUTH EVERY MORNING AND AT BEDTIME. ALWAYS TAKE 1/2 TABLET BEFORE BEDTIME. CAN TAKE 1/2 IN THE AM IF BLOOD PRESSURE> 165/100 90 tablet 3  ? aspirin 81 MG tablet Take 1 tablet (81 mg total) by mouth daily. 30 tablet   ? calcium-vitamin D 250-100 MG-UNIT tablet Take 1 tablet by mouth 2 (two) times daily.    ? DULoxetine (CYMBALTA) 30 MG capsule TAKE ONE CAPSULE BY MOUTH TWICE DAILY 180 capsule 3  ? famotidine (PEPCID) 20 MG tablet Take 1 tablet (20 mg total) by mouth 2 (two) times daily. 180 tablet 1  ? gabapentin (NEURONTIN) 300 MG capsule TAKE 1 CAPSULE(300 MG) BY MOUTH TWICE DAILY AS NEEDED FOR PAIN 180 capsule 3  ? hydrocortisone (ANUSOL-HC) 25 MG suppository Place 1 suppository (25 mg total) rectally 2 (two) times daily. For hemorrhoids internal 12 suppository 1  ? Multiple Vitamins-Minerals (MULTIVITAMIN WITH MINERALS) tablet Take 1 tablet by mouth daily.    ? Polyethyl Glycol-Propyl Glycol (SYSTANE) 0.4-0.3 % GEL ophthalmic gel Place 1 application into both eyes 3 (three) times daily as needed (  dry eye).    ? rosuvastatin (CRESTOR) 40 MG tablet TAKE 1 TABLET(40 MG) BY MOUTH DAILY 90 tablet 3  ? zolpidem (AMBIEN) 5 MG tablet TAKE 1 TABLET BY MOUTH EVERY DAY AT BEDTIME FOR SLEEP 90 tablet 1  ? ?No current facility-administered medications for this visit.  ? ?  ?Objective:  ?BP 122/80   Pulse 74   Temp 98.2 ?F (36.8 ?C) (Temporal)   Ht 5' 5.75" (1.67 m)   Wt 144 lb (65.3 kg)   LMP 08/25/1992   SpO2 96%   BMI 23.42 kg/m?  ?Gen: NAD, resting comfortably ?CV: RRR no murmurs rubs or gallops ?Lungs: CTAB no crackles, wheeze, rhonchi ?Ext: no edema ?Skin: warm, dry ?  ? ?Assessment and Plan  ? ?#Diverticulitis-treated for this at last visit with pinpoint left lower quadrant pain-plan with liquid diet and if not  improving could start antibiotics- she did not end up needing these  ? ?#hypertension with history of significant orthostatic intolerance ?S: medication: Amlodipine 1.25 mg before bed-can take an additional half tablet if needed if blood pressure over 165/100- hasnt needed lately ?- no lightheadedness or dizziness today ?BP Readings from Last 3 Encounters:  ?12/07/21 122/80  ?11/02/21 138/76  ?06/04/21 138/86  ?A/P: doing better lately and no orthostatic presyncope- continue current meds ? ?#hyperlipidemia ?#History of TIA with LDL goal under 70 ?S: Medication: Rosuvastatin 40 mg, aspirin 81 mg ?Lab Results  ?Component Value Date  ? CHOL 160 07/21/2020  ? HDL 68 07/21/2020  ? [place] 75 07/21/2020  ? LDLDIRECT 59.0 11/03/2020  ? TRIG 89 07/21/2020  ? CHOLHDL 2.4 07/21/2020  ? A/P: Lipids were well controlled when checked last year but due for full lipid panel-check labs today-likely continue current medication ?- With history of TIA continue aspirin 81 mg ? ?# Depression ?S: Medication: Cymbalta 30 mg twice daily ?-Did not tolerate 60 mg in the morning and 30 mg in the evening ?-Working with therapist in 2023 with [place] [name] - planned again 30th of this month ?-Marriage and recent years has been very challenging- better lately but still imperfect ?-at last visit patient was going to [place] and found this helpful.  Not drinking alcohol-in particular wanted to emphasize this with Ambien  ?A/P: imperfect control but overall stable- phq9 3 within a month- wants to continue to improve with therapy ? ? # Hyperglycemia/insulin resistance/prediabetes-A1c of 5.06 February 2021 ?S:  Medication: None ?Exercise and diet- getting out with dog in backyard and getting some good movement, reasonably healthy diet ?Lab Results  ?Component Value Date  ? HGBA1C 5.9 02/09/2021  ?A/P: hopefully stable or improved- update a1c today. Continue withoutmeds for now ? ?#Insomnia-reasonably controlled on Ambien 5 mg- falling asleep at least-  still some wake ups- feeling more rested in AM but imperfect ? ?#Chronic kidney disease stage III ?S: GFR is typically in the 30s range ?-Patient knows to avoid NSAIDs  ?A/P: hopefully stable- update GFR today. Continue current meds for now ?  ?# Itching ?S:went to Dr. Mardelle Matte back in January and had burning in left arm-some concern could be from neck- now down into both legs and ends up scratching and has bruises due to this.  Cold compresses on arms helped some ?-gapapentin 300mg  in AM and PM- only some help ?A/P: I am not sure what is causing the itching but trying vaseline twice a day would be reasonable. In smaller patches that are more bothersome could use benadryl cream . Could be related to  fibromyalgia ? ?#senile purpura- noted- aspirin contributes. Stable. Check cbc at least annually ? ?Recommended follow up: Return in about 3 months (around 03/09/2022) for followup or sooner if needed.Schedule b4 you leave. Or up to 6 months ?Future Appointments  ?Date Time Provider [place]  ?12/28/2021  2:00 PM Bauert, Terri W, LCSW [facility] None  ?01/20/2022  4:00 PM Bauert, Nicolasa Ducking, LCSW [facility] None  ?10/24/2022  3:00 PM [facility] CCM PHARMACIST [facility]  ? ? ?Lab/Order associations: fasting ?  ICD-10-CM   ?1. Primary hypertension  I10   ?  ?2. Hyperlipidemia, unspecified hyperlipidemia type  E78.5 CBC with Differential/Platelet  ?  Comprehensive metabolic panel  ?  Lipid panel  ?  ?3. History of transient ischemic attack (TIA)  Z86.73   ?  ?4. Recurrent major depressive disorder, in full remission ([place])  F33.42   ?  ?5. Stage 3 chronic kidney disease, unspecified whether stage 3a or 3b CKD ([place])  N18.30   ?  ?6. Senile purpura ([facility]) Chronic D69.2   ?  ?7. Hyperglycemia  R73.9 Hemoglobin A1c  ?  ? ? ?No orders of the defined types were placed in this encounter. ? ? ?Return precautions advised.  ?Garret Reddish, MD ? ?

## 2021-12-07 NOTE — Patient Instructions (Addendum)
I am not sure what is causing the itching but trying vaseline twice a day would be reasonable. In smaller patches that are more bothersome could use benadryl cream  ? ?Please stop by lab before you go ?If you have mychart- we will send your results within 3 business days of [place] receiving them.  ?If you do not have mychart- we will call you about results within 5 business days of [place] receiving them.  ?*please also note that you will see labs on mychart as soon as they post. I will later go in and write notes on them- will say "notes from Dr. Yong Channel"  ? ?Recommended follow up: Return in about 3 months (around 03/09/2022) for followup or sooner if needed.Schedule b4 you leave. Or if doing well could go to 6 months ?

## 2021-12-07 NOTE — Telephone Encounter (Addendum)
Deductible n/a  OOP MAX [id number] ($121.74)  Annual exam 12/17/2020  Calcium  9.0           Date 12/07/2021  Upcoming dental procedures NO  Prior Authorization needed NO  Pt estimated Cost $301   Appt 02/09/2022    Coverage Details:20% one dose, 20% admin fee

## 2021-12-10 ENCOUNTER — Ambulatory Visit: Payer: Medicare Other | Admitting: Family Medicine

## 2021-12-21 ENCOUNTER — Ambulatory Visit (INDEPENDENT_AMBULATORY_CARE_PROVIDER_SITE_OTHER): Payer: Medicare Other

## 2021-12-21 DIAGNOSIS — Z0000 Encounter for general adult medical examination without abnormal findings: Secondary | ICD-10-CM

## 2021-12-21 NOTE — Patient Instructions (Signed)
[name] , Thank you for taking time to come for your Medicare Wellness Visit. I appreciate your ongoing commitment to your health goals. Please review the following plan we discussed and let me know if I can assist you in the future.   Screening recommendations/referrals: Colonoscopy: No longer required   Mammogram: Done 03/30/21 repeat every year  Bone Density: Done 03/23/21 repeat every 2 years  Recommended yearly ophthalmology/optometry visit for glaucoma screening and checkup Recommended yearly dental visit for hygiene and checkup  Vaccinations: Influenza vaccine: Done 05/06/21 repeat every year  Pneumococcal vaccine: Up to date Tdap vaccine: Due  Shingles vaccine: Shingrix discussed. Please contact your pharmacy for coverage information.    Covid-19:Completed Completed 2/6, 2/26, 05/27/20 & 04/14/21  Advanced directives: Please bring a copy of your health care power of attorney and living will to the office at your convenience.  Conditions/risks identified: live longer   Next appointment: Follow up in one year for your annual wellness visit    Preventive Care 36 Years and Older, Female Preventive care refers to lifestyle choices and visits with your health care provider that can promote health and wellness. What does preventive care include? A yearly physical exam. This is also called an annual well check. Dental exams once or twice a year. Routine eye exams. Ask your health care provider how often you should have your eyes checked. Personal lifestyle choices, including: Daily care of your teeth and gums. Regular physical activity. Eating a healthy diet. Avoiding tobacco and drug use. Limiting alcohol use. Practicing safe sex. Taking low-dose aspirin every day. Taking vitamin and mineral supplements as recommended by your health care provider. What happens during an annual well check? The services and screenings done by your health care provider during your annual well  check will depend on your age, overall health, lifestyle risk factors, and family history of disease. Counseling  Your health care provider may ask you questions about your: Alcohol use. Tobacco use. Drug use. Emotional well-being. Home and relationship well-being. Sexual activity. Eating habits. History of falls. Memory and ability to understand (cognition). Work and work Statistician. Reproductive health. Screening  You may have the following tests or measurements: Height, weight, and BMI. Blood pressure. Lipid and cholesterol levels. These may be checked every 5 years, or more frequently if you are over 31 years old. Skin check. Lung cancer screening. You may have this screening every year starting at age 65 if you have a 30-pack-year history of smoking and currently smoke or have quit within the past 15 years. Fecal occult blood test (FOBT) of the stool. You may have this test every year starting at age 18. Flexible sigmoidoscopy or colonoscopy. You may have a sigmoidoscopy every 5 years or a colonoscopy every 10 years starting at age 14. Hepatitis C blood test. Hepatitis B blood test. Sexually transmitted disease (STD) testing. Diabetes screening. This is done by checking your blood sugar (glucose) after you have not eaten for a while (fasting). You may have this done every 1-3 years. Bone density scan. This is done to screen for osteoporosis. You may have this done starting at age 52. Mammogram. This may be done every 1-2 years. Talk to your health care provider about how often you should have regular mammograms. Talk with your health care provider about your test results, treatment options, and if necessary, the need for more tests. Vaccines  Your health care provider may recommend certain vaccines, such as: Influenza vaccine. This is recommended every year. Tetanus, diphtheria,  and acellular pertussis (Tdap, Td) vaccine. You may need a Td booster every 10 years. Zoster  vaccine. You may need this after age 25. Pneumococcal 13-valent conjugate (PCV13) vaccine. One dose is recommended after age 46. Pneumococcal polysaccharide (PPSV23) vaccine. One dose is recommended after age 2. Talk to your health care provider about which screenings and vaccines you need and how often you need them. This information is not intended to replace advice given to you by your health care provider. Make sure you discuss any questions you have with your health care provider. Document Released: 08/14/2015 Document Revised: 04/06/2016 Document Reviewed: 05/19/2015 Elsevier Interactive Patient Education  2017 [place] Prevention in the Home Falls can cause injuries. They can happen to people of all ages. There are many things you can do to make your home safe and to help prevent falls. What can I do on the outside of my home? Regularly fix the edges of walkways and driveways and fix any cracks. Remove anything that might make you trip as you walk through a door, such as a raised step or threshold. Trim any bushes or trees on the path to your home. Use bright outdoor lighting. Clear any walking paths of anything that might make someone trip, such as rocks or tools. Regularly check to see if handrails are loose or broken. Make sure that both sides of any steps have handrails. Any raised decks and porches should have guardrails on the edges. Have any leaves, snow, or ice cleared regularly. Use sand or salt on walking paths during winter. Clean up any spills in your garage right away. This includes oil or grease spills. What can I do in the bathroom? Use night lights. Install grab bars by the toilet and in the tub and shower. Do not use towel bars as grab bars. Use non-skid mats or decals in the tub or shower. If you need to sit down in the shower, use a plastic, non-slip stool. Keep the floor dry. Clean up any water that spills on the floor as soon as it happens. Remove  soap buildup in the tub or shower regularly. Attach bath mats securely with double-sided non-slip rug tape. Do not have throw rugs and other things on the floor that can make you trip. What can I do in the bedroom? Use night lights. Make sure that you have a light by your bed that is easy to reach. Do not use any sheets or blankets that are too big for your bed. They should not hang down onto the floor. Have a firm chair that has side arms. You can use this for support while you get dressed. Do not have throw rugs and other things on the floor that can make you trip. What can I do in the kitchen? Clean up any spills right away. Avoid walking on wet floors. Keep items that you use a lot in easy-to-reach places. If you need to reach something above you, use a strong step stool that has a grab bar. Keep electrical cords out of the way. Do not use floor polish or wax that makes floors slippery. If you must use wax, use non-skid floor wax. Do not have throw rugs and other things on the floor that can make you trip. What can I do with my stairs? Do not leave any items on the stairs. Make sure that there are handrails on both sides of the stairs and use them. Fix handrails that are broken or loose. Make sure  that handrails are as long as the stairways. Check any carpeting to make sure that it is firmly attached to the stairs. Fix any carpet that is loose or worn. Avoid having throw rugs at the top or bottom of the stairs. If you do have throw rugs, attach them to the floor with carpet tape. Make sure that you have a light switch at the top of the stairs and the bottom of the stairs. If you do not have them, ask someone to add them for you. What else can I do to help prevent falls? Wear shoes that: Do not have high heels. Have rubber bottoms. Are comfortable and fit you well. Are closed at the toe. Do not wear sandals. If you use a stepladder: Make sure that it is fully opened. Do not climb a  closed stepladder. Make sure that both sides of the stepladder are locked into place. Ask someone to hold it for you, if possible. Clearly mark and make sure that you can see: Any grab bars or handrails. First and last steps. Where the edge of each step is. Use tools that help you move around (mobility aids) if they are needed. These include: Canes. Walkers. Scooters. Crutches. Turn on the lights when you go into a dark area. Replace any light bulbs as soon as they burn out. Set up your furniture so you have a clear path. Avoid moving your furniture around. If any of your floors are uneven, fix them. If there are any pets around you, be aware of where they are. Review your medicines with your doctor. Some medicines can make you feel dizzy. This can increase your chance of falling. Ask your doctor what other things that you can do to help prevent falls. This information is not intended to replace advice given to you by your health care provider. Make sure you discuss any questions you have with your health care provider. Document Released: 05/14/2009 Document Revised: 12/24/2015 Document Reviewed: 08/22/2014 Elsevier Interactive Patient Education  2017 [employer].

## 2021-12-21 NOTE — Progress Notes (Signed)
Virtual Visit via Telephone Note  I connected with  Sandra Craig on 12/21/21 at 10:30 AM EDT by telephone and verified that I am speaking with the correct person using two identifiers.  Medicare Annual Wellness visit completed telephonically due to Covid-19 pandemic.   Persons participating in this call: This Health Coach and this patient.   Location: Patient: home Provider: office   I discussed the limitations, risks, security and privacy concerns of performing an evaluation and management service by telephone and the availability of in person appointments. The patient expressed understanding and agreed to proceed.  Unable to perform video visit due to video visit attempted and failed and/or patient does not have video capability.   Some vital signs may be absent or patient reported.   Willette Brace, LPN   Subjective:   Sandra Craig is a 81 y.o. female who presents for Medicare Annual (Subsequent) preventive examination.  Review of Systems     Cardiac Risk Factors include: advanced age (>50men, >30 women);dyslipidemia;hypertension     Objective:    There were no vitals filed for this visit. There is no height or weight on file to calculate BMI.     12/21/2021   10:38 AM 01/03/2021   10:02 AM 02/26/2020    1:07 PM 02/20/2020   12:04 PM 08/01/2019    9:49 AM 04/23/2019    7:15 AM 10/12/2018    1:52 PM  Advanced Directives  Does Patient Have a Medical Advance Directive? Yes Yes No No Yes Yes Yes  Type of Academic librarian Living will   Living will;Healthcare Power of [place];Living will   Does patient want to make changes to medical advance directive?  No - Patient declined   No - Patient declined    Copy of [place] in Chart? No - copy requested    No - copy requested No - copy requested   Would patient like information on creating a medical advance directive?  No - Patient declined No -  Guardian declined No - Patient declined       Current Medications (verified) Outpatient Encounter Medications as of 12/21/2021  Medication Sig   amLODipine (NORVASC) 2.5 MG tablet TAKE 1/2 TABLET BY MOUTH EVERY MORNING AND AT BEDTIME. ALWAYS TAKE 1/2 TABLET BEFORE BEDTIME. CAN TAKE 1/2 IN THE AM IF BLOOD PRESSURE> 165/100   aspirin 81 MG tablet Take 1 tablet (81 mg total) by mouth daily.   calcium-vitamin D 250-100 MG-UNIT tablet Take 1 tablet by mouth 2 (two) times daily.   DULoxetine (CYMBALTA) 30 MG capsule TAKE ONE CAPSULE BY MOUTH TWICE DAILY   famotidine (PEPCID) 20 MG tablet Take 1 tablet (20 mg total) by mouth 2 (two) times daily.   gabapentin (NEURONTIN) 300 MG capsule TAKE 1 CAPSULE(300 MG) BY MOUTH TWICE DAILY AS NEEDED FOR PAIN   hydrocortisone (ANUSOL-HC) 25 MG suppository Place 1 suppository (25 mg total) rectally 2 (two) times daily. For hemorrhoids internal   Multiple Vitamins-Minerals (MULTIVITAMIN WITH MINERALS) tablet Take 1 tablet by mouth daily.   Polyethyl Glycol-Propyl Glycol (SYSTANE) 0.4-0.3 % GEL ophthalmic gel Place 1 application into both eyes 3 (three) times daily as needed (dry eye).   rosuvastatin (CRESTOR) 40 MG tablet TAKE 1 TABLET(40 MG) BY MOUTH DAILY   zolpidem (AMBIEN) 5 MG tablet TAKE 1 TABLET BY MOUTH EVERY DAY AT BEDTIME FOR SLEEP   No facility-administered encounter medications on file as of 12/21/2021.  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   Chronic insomnia    Cystitis    Depression    Diverticulitis of intestine without perforation or abscess without bleeding    Patient did have abscess but noperforation   Diverticulosis of colon (without mention of hemorrhage)    Endometriosis    Family history of malignant neoplasm of gastrointestinal tract    Fibromyalgia    Gastritis    Hiatal hernia    HIATAL HERNIA  10/08/2008   Qualifier: Diagnosis of  By: Nils Pyle CMA (AAMA), Mearl Latin     History of gallstones    Hyperlipidemia    Hypertension    IBS (irritable bowel syndrome)    IC (interstitial cystitis)    Internal hemorrhoid    Osteonecrosis ([place])    Osteopenia    Osteoporosis    Palpitations    Polycystic kidney disease    Pulmonary nodule 12/08   5 mm Anterior RUL   Small bowel obstruction ([place])    Stroke ([place])    TIA   Past Surgical History:  Procedure Laterality Date   ABDOMINAL HYSTERECTOMY  1994   TAH,BSO FOR ENDOMETRIOSIS   ABDOMINAL SURGERY  2011   small intestine blockage   CHOLECYSTECTOMY     GASTROPLASTY  2011   small bowel resection -open   INGUINAL HERNIA REPAIR Right 02/28/2020   Procedure: LAPAROSCOPIC RIGHT INGUINAL HERNIA REPAIR WITH MESH;  Surgeon: Ralene Ok, MD;  Location: [facility];  Service: General;  Laterality: Right;   JOINT REPLACEMENT  2008   OOPHORECTOMY  1994   TAH,BSO   PELVIC LAPAROSCOPY     S/P right shoulder rotater cuff  200216/2011   Tear/adhesive capsulitis   SBO Lap  11   Adhesions and small internal hernia   SHOULDER HEMI-ARTHROPLASTY  06/05/2012   Procedure: SHOULDER HEMI-ARTHROPLASTY;  Surgeon: Johnny Bridge, MD;  Location: [facility];  Service: Orthopedics;  Laterality: Right;  FOR ARTHRITIS   TOTAL HIP ARTHROPLASTY  FALL OF 2008   rt. partial hip replacement   TOTAL SHOULDER ARTHROPLASTY  06/05/2012   Procedure: TOTAL SHOULDER ARTHROPLASTY;  Surgeon: Johnny Bridge, MD;  Location: [facility];  Service: Orthopedics;  Laterality: Right;  RIGHT SHOULDER TOTAL ARTHROPLASTY, HEMIARTHROPLASTY, SHOULDER, FOR ARTHRITIS   VIDEO BRONCHOSCOPY Bilateral 01/28/2015   Procedure: VIDEO BRONCHOSCOPY WITH FLUORO;  Surgeon: Collene Gobble, MD;  Location: [facility];  Service: Cardiopulmonary;  Laterality: Bilateral;   VIDEO BRONCHOSCOPY Bilateral 04/23/2019   Procedure: VIDEO BRONCHOSCOPY WITHOUT FLUORO;  Surgeon: Collene Gobble, MD;  Location: [facility];   Service: Cardiopulmonary;  Laterality: Bilateral;   Family History  Problem Relation Age of Onset   Heart disease Mother        MI at age 71   Emphysema Mother    Thyroid disease Mother        Thyroidectomy/Benign   Lymphoma Maternal Grandmother    Colon cancer Paternal Grandmother    COPD Father    Cancer Brother        adenocarcinoma right lung   COPD Brother    Heart attack Paternal Grandfather    Pancreatic cancer Sister        died 83   Esophageal cancer Neg Hx    Rectal cancer Neg Hx    Stomach cancer Neg Hx    Social History   Socioeconomic History   Marital status:  Married    Spouse name: Not on file   Number of children: 0   Years of education: Not on file   Highest education level: Not on file  Occupational History   Occupation: Retired    Comment: Manufacturing engineer  Tobacco Use   Smoking status: Former    Packs/day: 0.75    Years: 8.00    Pack years: 6.00    Types: Cigarettes    Quit date: 08/01/1976    Years since quitting: 45.4   Smokeless tobacco: Never   Tobacco comments:    Quit in 1978  Vaping Use   Vaping Use: Never used  Substance and Sexual Activity   Alcohol use: No    Alcohol/week: 0.0 standard drinks   Drug use: No   Sexual activity: Yes  Other Topics Concern   Not on file  Social History Narrative   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 Married to Long term boyfriend 2 years prior 2021. Prior Widowed.       Enjoys reading    Social Determinants of Radio broadcast assistant Strain: Low Risk    Difficulty of Paying Living Expenses: Not hard at all  Food Insecurity: No Food Insecurity   Worried About Charity fundraiser in the Last Year: Never true   Arboriculturist in the Last Year: Never true  Transportation Needs: No Transportation Needs   Lack of Transportation (Medical): No   Lack of Transportation (Non-Medical): No  Physical Activity: Inactive   Days of Exercise per Week: 0 days    Minutes of Exercise per Session: 0 min  Stress: No Stress Concern Present   Feeling of Stress : Not at all  Social Connections: Moderately Integrated   Frequency of Communication with Friends and Family: More than three times a week   Frequency of Social Gatherings with Friends and Family: More than three times a week   Attends Religious Services: Never   Marine scientist or Organizations: Yes   Attends Music therapist: 1 to 4 times per year   Marital Status: Married    Tobacco Counseling Counseling given: Not Answered Tobacco comments: Quit in 1978   Clinical Intake:  Pre-visit preparation completed: Yes  Pain : No/denies pain     BMI - recorded: 23.42 Nutritional Status: BMI of 19-24  Normal Nutritional Risks: None Diabetes: No  How often do you need to have someone help you when you read instructions, pamphlets, or other written materials from your doctor or pharmacy?: 1 - Never  Diabetic?no  Interpreter Needed?: No  Information entered by :: Kirt Boys, LPN   Activities of Daily Living    12/21/2021   10:40 AM  In your present state of health, do you have any difficulty performing the following activities:  Hearing? 1  Comment wear a hearing aids  Vision? 0  Difficulty concentrating or making decisions? 0  Walking or climbing stairs? 0  Dressing or bathing? 0  Doing errands, shopping? 0  Preparing Food and eating ? N  Using the Toilet? N  In the past six months, have you accidently leaked urine? N  Do you have problems with loss of bowel control? N  Managing your Medications? N  Managing your Finances? N  Housekeeping or managing your Housekeeping? N    Patient Care Team: [name], MD as PCP - General (Family Medicine) Collene Gobble, MD as Consulting Physician (Pulmonary Disease)  Huel Cote, NP (Inactive) as Nurse Practitioner (Obstetrics and Gynecology) Laroy Apple, MD as Referring Physician (Physical  Medicine and Rehabilitation) Surgery, [facility] as Surgeon (General Surgery) Edythe Clarity, [facility] as Pharmacist (Pharmacist)  Indicate any recent Medical Services you may have received from other than [facility] providers in the past year (date may be approximate).     Assessment:   This is a routine wellness examination for Isela.  Hearing/Vision screen Hearing Screening - Comments:: Pt wears a hearing aids  Vision Screening - Comments:: Pt follows up with Dr Juanda Chance  for annual eye exams   Dietary issues and exercise activities discussed: Current Exercise Habits: The patient does not participate in regular exercise at present   Goals Addressed             This Visit's Progress    Patient Stated       Live longer        Depression Screen    12/21/2021   10:37 AM 11/02/2021   11:28 AM 06/04/2021    1:03 PM 05/20/2021    9:18 AM 02/09/2021   11:32 AM 11/03/2020    8:27 AM 05/15/2020   11:38 AM  PHQ 2/9 Scores  PHQ - 2 Score 0 0 0 6 2 2  0  PHQ- 9 Score  3  14 5 7 2     Fall Risk    12/21/2021   10:39 AM 12/07/2021    8:51 AM 06/04/2021    1:03 PM 11/03/2020    8:27 AM 08/01/2019    9:50 AM  Fall Risk   Falls in the past year? 1 0 0 0 0  Number falls in past yr: 1 0 0 0   Injury with Fall? 0 0 0 0 0  Risk for fall due to :  No Fall Risks     Follow up Falls prevention discussed Falls evaluation completed Falls evaluation completed  Education provided;Falls prevention discussed;Falls evaluation completed    FALL RISK PREVENTION PERTAINING TO THE HOME:  Any stairs in or around the home? Yes  If so, are there any without handrails? No  Home free of loose throw rugs in walkways, pet beds, electrical cords, etc? Yes  Adequate lighting in your home to reduce risk of falls? Yes   ASSISTIVE DEVICES UTILIZED TO PREVENT FALLS:  Life alert? Yes  Use of a cane, walker or w/c? No  Grab bars in the bathroom? Yes  Shower chair or bench in shower? Yes  Elevated toilet  seat or a handicapped toilet? Yes   TIMED UP AND GO:  Was the test performed? No .   Cognitive Function:    07/26/2018    1:19 PM  MMSE - Mini Mental State Exam  Orientation to time 5  Orientation to Place 5  Registration 3  Attention/ Calculation 5  Recall 3  Language- name 2 objects 2  Language- repeat 1  Language- follow 3 step command 3  Language- read & follow direction 1  Write a sentence 1  Copy design 1  Total score 30        12/21/2021   10:41 AM  6CIT Screen  What Year? 0 points  What month? 0 points  What time? 0 points  Count back from 20 0 points  Months in reverse 0 points  Repeat phrase 0 points  Total Score 0 points    Immunizations Immunization History  Administered Date(s) Administered   Fluad Quad(high Dose 65+) 04/02/2019, 04/05/2020,  05/06/2021   Influenza Split 05/23/2012   Influenza Whole 05/31/2006, 05/28/2007, 05/14/2008, 04/22/2009, 05/01/2010, 04/12/2011   Influenza, High Dose Seasonal PF 05/19/2013, 03/26/2014, 04/21/2016, 04/21/2017, 04/21/2018, 04/02/2019   Influenza,inj,Quad PF,6+ Mos 04/15/2015   Influenza-Unspecified 04/29/2018   Moderna Sars-Covid-2 Vaccination 04/14/2021   PFIZER(Purple Top)SARS-COV-2 Vaccination 09/07/2019, 09/27/2019, 05/27/2020   Pneumococcal Conjugate-13 02/03/2015   Pneumococcal Polysaccharide-23 05/31/2006, 06/07/2012   Td 12/21/2009   Zoster, Live 02/06/2006    TDAP status: Due, Education has been provided regarding the importance of this vaccine. Advised may receive this vaccine at local pharmacy or Health Dept. Aware to provide a copy of the vaccination record if obtained from local pharmacy or Health Dept. Verbalized acceptance and understanding.  Flu Vaccine status: Up to date  Pneumococcal vaccine status: Up to date  Covid-19 vaccine status: Completed vaccines  Qualifies for Shingles Vaccine? Yes   Zostavax completed No   Shingrix Completed?: No.    Education has been provided regarding the  importance of this vaccine. Patient has been advised to call insurance company to determine out of pocket expense if they have not yet received this vaccine. Advised may also receive vaccine at local pharmacy or Health Dept. Verbalized acceptance and understanding.  Screening Tests Health Maintenance  Topic Date Due   Zoster Vaccines- Shingrix (1 of 2) 02/01/2022 (Originally 05/02/1960)   TETANUS/TDAP  02/09/2022 (Originally 12/22/2019)   COVID-19 Vaccine (5 - Booster for Pfizer series) 05/17/2022 (Originally 06/09/2021)   INFLUENZA VACCINE  03/01/2022   MAMMOGRAM  03/30/2022   Pneumonia Vaccine 13+ Years old  Completed   DEXA SCAN  Completed   Hepatitis C Screening  Completed   HPV VACCINES  Aged Out   COLONOSCOPY (Pts 45-36yrs Insurance coverage will need to be confirmed)  Discontinued    Health Maintenance  There are no preventive care reminders to display for this patient.  Colorectal cancer screening: No longer required.   Mammogram status: Completed 03/30/21. Repeat every year  Bone Density status: Completed 03/23/21. Results reflect: Bone density results: OSTEOPOROSIS. Repeat every 2 years.    Additional Screening:  Hepatitis C Screening:  Completed 05/05/20  Vision Screening: Recommended annual ophthalmology exams for early detection of glaucoma and other disorders of the eye. Is the patient up to date with their annual eye exam?  Yes  Who is the provider or what is the name of the office in which the patient attends annual eye exams? Dr Einar Gip  If pt is not established with a provider, would they like to be referred to a provider to establish care? No .   Dental Screening: Recommended annual dental exams for proper oral hygiene  Community Resource Referral / Chronic Care Management: CRR required this visit?  No   CCM required this visit?  No      Plan:     I have personally reviewed and noted the following in the patient's chart:   Medical and social  history Use of alcohol, tobacco or illicit drugs  Current medications and supplements including opioid prescriptions.  Functional ability and status Nutritional status Physical activity Advanced directives List of other physicians Hospitalizations, surgeries, and ER visits in previous 12 months Vitals Screenings to include cognitive, depression, and falls Referrals and appointments  In addition, I have reviewed and discussed with patient certain preventive protocols, quality metrics, and best practice recommendations. A written personalized care plan for preventive services as well as general preventive health recommendations were provided to patient.     Willette Brace, LPN  12/21/2021   Nurse Notes: None

## 2021-12-28 ENCOUNTER — Ambulatory Visit: Payer: Medicare Other | Admitting: Psychology

## 2021-12-28 NOTE — Progress Notes (Incomplete)
[facility] Counselor/Therapist Progress Note  Patient ID: Sandra Craig, MRN: [id number],    Date: 12/28/2021  Time Spent: 12:00pm - 12:55pm  55 minutes   Treatment Type: Individual Therapy  Reported Symptoms: confusion, loneliness  Mental Status Exam: Appearance:  Casual     Behavior: Appropriate  Motor: Normal  Speech/Language:  Normal Rate  Affect: Appropriate  Mood: normal  Thought process: normal  Thought content:   WNL  Sensory/Perceptual disturbances:   WNL  Orientation: oriented to person, place, time/date, and situation  Attention: Good  Concentration: Good  Memory: WNL  Fund of knowledge:  Good  Insight:   Good  Judgment:  Good  Impulse Control: Good   Risk Assessment: Danger to Self:  No Self-injurious Behavior: No Danger to Others: No Duty to Warn:no Physical Aggression / Violence:No  Access to Firearms a concern: No  Gang Involvement:No   Subjective: Pt present for individual therapy in person. Pt talked about feeling confused a lot.  She is concerned about her memory.  She feels her memory has gotten worse in the past year and a half.   Pt talked about regretting that she married her husband.  Pt misses the condo where she use to live.  Pt felt safe and comfortable there.  Pt's husband's house is too big and needs a lot of work.  Pt feels like she can not keep up with the house.    Pt feels "insecure and less than".  She does not stand up for herself with [name].  Pt feels lonely in the marriage bc [name] does not pay much attention to her.  He watches sports and stays in front of the television most of the time.  [name] also still works a few days a week.   Pt does not feel like she is very important to [employer].  Pt feels resentful about their financial arrangement.  Helped pt process her feelings and relationship dynamics.   Pt has thought about leaving the marriage but the logistics are difficult to navigate.  Pt told  [name] she has looked at apartments bc she is unhappy.  Pt has been sober for 46 years.  She has started going back to [place] meetings bc it is a good support system for her.   Pt has little support from family.  She may consider going to an assisted living place that her sister lives in.    Provided supportive therapy.    Interventions: Cognitive Behavioral Therapy and Insight-Oriented  Diagnosis: F33.1  Plan: Plan to meet in two weeks.  Pt is progressing toward treatment goals.   Plan of Care: Recommend ongoing therapy.   Pt participated in setting treatment goals.  Pt wants to have someone to talk to and to improve coping skills.  Pt wants to feel less anxious and depressed.  Plan to meet every two weeks.    Treatment Plan (Treatment Plan Target Date: 10/26/2022) Client Abilities/Strengths  Pt is bright, engaging, and motivated for therapy.   Client Treatment Preferences  Individual therapy.  Client Statement of Needs  Improve coping skills.  Symptoms  Depressed or irritable mood. Diminished interest in or enjoyment of activities. Lack of energy. Feelings of hopelessness, worthlessness, or inappropriate guilt. Unresolved grief issues.   Problems Addressed  Unipolar Depression Goals 1. Alleviate depressive symptoms and return to previous level of effective functioning. 2. Appropriately grieve the loss in order to normalize mood and to return to previously adaptive level of functioning. Objective Learn  and implement behavioral strategies to overcome depression. Target Date: 2022-10-26 Frequency: Biweekly  Progress: 10 Modality: individual  Related Interventions Engage the client in "behavioral activation," increasing his/her activity level and contact with sources of reward, while identifying processes that inhibit activation.  Use behavioral techniques such as instruction, rehearsal, role-playing, role reversal, as needed, to facilitate activity in the client's daily life;  reinforce success. Assist the client in developing skills that increase the likelihood of deriving pleasure from behavioral activation (e.g., assertiveness skills, developing an exercise plan, less internal/more external focus, increased social involvement); reinforce success. Objective Identify important people in life, past and present, and describe the quality, good and poor, of those relationships. Target Date: 2022-10-26 Frequency: Biweekly  Progress: 10 Modality: individual  Related Interventions Conduct Interpersonal Therapy beginning with the assessment of the client's "interpersonal inventory" of important past and present relationships; develop a case formulation linking depression to grief, interpersonal role disputes, role transitions, and/or interpersonal deficits). Objective Learn and implement problem-solving and decision-making skills. Target Date: 2022-10-26 Frequency: Biweekly  Progress: 10 Modality: individual  Related Interventions Conduct Problem-Solving Therapy using techniques such as psychoeducation, modeling, and role-playing to teach client problem-solving skills (i.e., defining a problem specifically, generating possible solutions, evaluating the pros and cons of each solution, selecting and implementing a plan of action, evaluating the efficacy of the plan, accepting or revising the plan); role-play application of the problem-solving skill to a real life issue. Encourage in the client the development of a positive problem orientation in which problems and solving them are viewed as a natural part of life and not something to be feared, despaired, or avoided. 3. Develop healthy interpersonal relationships that lead to the alleviation and help prevent the relapse of depression. 4. Develop healthy thinking patterns and beliefs about self, others, and the world that lead to the alleviation and help prevent the relapse of depression. 5. Recognize, accept, and cope with  feelings of depression. Diagnosis F33.1  Conditions For Discharge Achievement of treatment goals and objectives   [name], LCSW

## 2021-12-29 ENCOUNTER — Ambulatory Visit (INDEPENDENT_AMBULATORY_CARE_PROVIDER_SITE_OTHER): Payer: Medicare Other | Admitting: Psychology

## 2021-12-29 DIAGNOSIS — F331 Major depressive disorder, recurrent, moderate: Secondary | ICD-10-CM | POA: Diagnosis not present

## 2021-12-29 NOTE — Progress Notes (Signed)
[facility] Counselor/Therapist Progress Note  Patient ID: GAE BIHL, MRN: [id number],    Date: 12/29/2021  Time Spent: 10:00am-10:50am    50 minutes   Treatment Type: Individual Therapy  Reported Symptoms: confusion, loneliness  Mental Status Exam: Appearance:  Casual     Behavior: Appropriate  Motor: Normal  Speech/Language:  Normal Rate  Affect: Appropriate  Mood: normal  Thought process: normal  Thought content:   WNL  Sensory/Perceptual disturbances:   WNL  Orientation: oriented to person, place, time/date, and situation  Attention: Good  Concentration: Good  Memory: WNL  Fund of knowledge:  Good  Insight:   Good  Judgment:  Good  Impulse Control: Good   Risk Assessment: Danger to Self:  No Self-injurious Behavior: No Danger to Others: No Duty to Warn:no Physical Aggression / Violence:No  Access to Firearms a concern: No  Gang Involvement:No   Subjective:  [name] present for face-to-face individual therapy via video Webex.  Pt consents to telehealth video session due to COVID 19 pandemic. Location of pt: home Location of therapist: home office.  Pt talked about going on a trip with her husband and went to [place].   Pt had a great time on the trip.   She did get a little anxious about not knowing what was going to happen.   Pt talked about her marriage.   She states she does not love her husband and does not feel at home at his house.  Pt states her husband is very controlling.  Pt does not know if her husband would take care of her if she needed care.  Pt feels insecure about her future.  Helped pt process her feelings and relationship dynamics.   Pt thinks about leaving her husband but is scared about all that would entail.  Addressed pt's issues of feeling trapped.   She may begin to look at apartments or condos to see what her options would be if she chose to leave.  Pt feels very sad.  She states she has physical manifestations of the stress  she feels.  She has a rash and GI issues.  Pt has fibromyalgia.  Pt has had a couple of TIA's.   Pt has a limited support system bc two of  her siblings passed away the past couple of years.  Pt's best friend is in assisted living and has dementia.   Pt has started to go back to [place] for the social support.  Pt has been sober for 46 years.   Provided supportive therapy.    Interventions: Cognitive Behavioral Therapy and Insight-Oriented  Diagnosis: F33.1  Plan: Plan to meet in two weeks.  Pt is progressing toward treatment goals.   Plan of Care: Recommend ongoing therapy.   Pt participated in setting treatment goals.  Pt wants to have someone to talk to and to improve coping skills.  Pt wants to feel less anxious and depressed.  Plan to meet every two weeks.    Treatment Plan (Treatment Plan Target Date: 10/26/2022) Client Abilities/Strengths  Pt is bright, engaging, and motivated for therapy.   Client Treatment Preferences  Individual therapy.  Client Statement of Needs  Improve coping skills.  Symptoms  Depressed or irritable mood. Diminished interest in or enjoyment of activities. Lack of energy. Feelings of hopelessness, worthlessness, or inappropriate guilt. Unresolved grief issues.   Problems Addressed  Unipolar Depression Goals 1. Alleviate depressive symptoms and return to previous level of effective functioning. 2. Appropriately grieve the  loss in order to normalize mood and to return to previously adaptive level of functioning. Objective Learn and implement behavioral strategies to overcome depression. Target Date: 2022-10-26 Frequency: Biweekly  Progress: 10 Modality: individual  Related Interventions Engage the client in "behavioral activation," increasing his/her activity level and contact with sources of reward, while identifying processes that inhibit activation.  Use behavioral techniques such as instruction, rehearsal, role-playing, role reversal, as needed, to  facilitate activity in the client's daily life; reinforce success. Assist the client in developing skills that increase the likelihood of deriving pleasure from behavioral activation (e.g., assertiveness skills, developing an exercise plan, less internal/more external focus, increased social involvement); reinforce success. Objective Identify important people in life, past and present, and describe the quality, good and poor, of those relationships. Target Date: 2022-10-26 Frequency: Biweekly  Progress: 10 Modality: individual  Related Interventions Conduct Interpersonal Therapy beginning with the assessment of the client's "interpersonal inventory" of important past and present relationships; develop a case formulation linking depression to grief, interpersonal role disputes, role transitions, and/or interpersonal deficits). Objective Learn and implement problem-solving and decision-making skills. Target Date: 2022-10-26 Frequency: Biweekly  Progress: 10 Modality: individual  Related Interventions Conduct Problem-Solving Therapy using techniques such as psychoeducation, modeling, and role-playing to teach client problem-solving skills (i.e., defining a problem specifically, generating possible solutions, evaluating the pros and cons of each solution, selecting and implementing a plan of action, evaluating the efficacy of the plan, accepting or revising the plan); role-play application of the problem-solving skill to a real life issue. Encourage in the client the development of a positive problem orientation in which problems and solving them are viewed as a natural part of life and not something to be feared, despaired, or avoided. 3. Develop healthy interpersonal relationships that lead to the alleviation and help prevent the relapse of depression. 4. Develop healthy thinking patterns and beliefs about self, others, and the world that lead to the alleviation and help prevent the relapse of  depression. 5. Recognize, accept, and cope with feelings of depression. Diagnosis F33.1  Conditions For Discharge Achievement of treatment goals and objectives   [name], LCSW                 Moreen Piggott [place], LCSW

## 2022-01-20 ENCOUNTER — Ambulatory Visit (INDEPENDENT_AMBULATORY_CARE_PROVIDER_SITE_OTHER): Payer: Medicare Other | Admitting: Psychology

## 2022-01-20 DIAGNOSIS — F331 Major depressive disorder, recurrent, moderate: Secondary | ICD-10-CM

## 2022-01-20 NOTE — Progress Notes (Signed)
[facility] Counselor/Therapist Progress Note  Patient ID: MILEAH HEMMER, MRN: [id number],    Date: 01/20/2022  Time Spent: 4:00pm-4:50pm    50 minutes   Treatment Type: Individual Therapy  Reported Symptoms: confusion, loneliness  Mental Status Exam: Appearance:  Casual     Behavior: Appropriate  Motor: Normal  Speech/Language:  Normal Rate  Affect: Appropriate  Mood: normal  Thought process: normal  Thought content:   WNL  Sensory/Perceptual disturbances:   WNL  Orientation: oriented to person, place, time/date, and situation  Attention: Good  Concentration: Good  Memory: WNL  Fund of knowledge:  Good  Insight:   Good  Judgment:  Good  Impulse Control: Good   Risk Assessment: Danger to Self:  No Self-injurious Behavior: No Danger to Others: No Duty to Warn:no Physical Aggression / Violence:No  Access to Firearms a concern: No  Gang Involvement:No   Subjective:  [name] present for face-to-face individual therapy via video Webex.  Pt consents to telehealth video session due to COVID 19 pandemic. Location of pt: home Location of therapist: home office.  Pt talked about being unhappy in her marriage.   She feels like she is a caregiver instead of a wife.   Pt is looking into apartments and there may be an opportunity to live in her hair dresser's garage apartment.  Pt feels trapped in her husband's home and thinks making a plan for a possible alternative living situation may help her feel better.   Pt has realized she needs to branch out and do some things and connect with friends that make her feel good and get her out of the house.   Pt has a limited support system bc two of  her siblings passed away the past couple of years.  Pt's best friend is in assisted living and has dementia.   Pt has started to go back to [place] for the social support.  Pt has been sober for 46 years.  Pt has had thoughts about drinking which have worried her.  Encouraged pt to make  contact with her [facility] sponsor and to go to an [place] meeting every day.   Provided supportive therapy.    Interventions: Cognitive Behavioral Therapy and Insight-Oriented  Diagnosis: F33.1  Plan: Plan to meet in two weeks.  Pt is progressing toward treatment goals.   Plan of Care: Recommend ongoing therapy.   Pt participated in setting treatment goals.  Pt wants to have someone to talk to and to improve coping skills.  Pt wants to feel less anxious and depressed.  Plan to meet every two weeks.    Treatment Plan (Treatment Plan Target Date: 10/26/2022) Client Abilities/Strengths  Pt is bright, engaging, and motivated for therapy.   Client Treatment Preferences  Individual therapy.  Client Statement of Needs  Improve coping skills.  Symptoms  Depressed or irritable mood. Diminished interest in or enjoyment of activities. Lack of energy. Feelings of hopelessness, worthlessness, or inappropriate guilt. Unresolved grief issues.   Problems Addressed  Unipolar Depression Goals 1. Alleviate depressive symptoms and return to previous level of effective functioning. 2. Appropriately grieve the loss in order to normalize mood and to return to previously adaptive level of functioning. Objective Learn and implement behavioral strategies to overcome depression. Target Date: 2022-10-26 Frequency: Biweekly  Progress: 10 Modality: individual  Related Interventions Engage the client in "behavioral activation," increasing his/her activity level and contact with sources of reward, while identifying processes that inhibit activation.  Use behavioral techniques such  as instruction, rehearsal, role-playing, role reversal, as needed, to facilitate activity in the client's daily life; reinforce success. Assist the client in developing skills that increase the likelihood of deriving pleasure from behavioral activation (e.g., assertiveness skills, developing an exercise plan, less internal/more external focus,  increased social involvement); reinforce success. Objective Identify important people in life, past and present, and describe the quality, good and poor, of those relationships. Target Date: 2022-10-26 Frequency: Biweekly  Progress: 10 Modality: individual  Related Interventions Conduct Interpersonal Therapy beginning with the assessment of the client's "interpersonal inventory" of important past and present relationships; develop a case formulation linking depression to grief, interpersonal role disputes, role transitions, and/or interpersonal deficits). Objective Learn and implement problem-solving and decision-making skills. Target Date: 2022-10-26 Frequency: Biweekly  Progress: 10 Modality: individual  Related Interventions Conduct Problem-Solving Therapy using techniques such as psychoeducation, modeling, and role-playing to teach client problem-solving skills (i.e., defining a problem specifically, generating possible solutions, evaluating the pros and cons of each solution, selecting and implementing a plan of action, evaluating the efficacy of the plan, accepting or revising the plan); role-play application of the problem-solving skill to a real life issue. Encourage in the client the development of a positive problem orientation in which problems and solving them are viewed as a natural part of life and not something to be feared, despaired, or avoided. 3. Develop healthy interpersonal relationships that lead to the alleviation and help prevent the relapse of depression. 4. Develop healthy thinking patterns and beliefs about self, others, and the world that lead to the alleviation and help prevent the relapse of depression. 5. Recognize, accept, and cope with feelings of depression. Diagnosis F33.1  Conditions For Discharge Achievement of treatment goals and objectives   [name], LCSW                 [name], [place]  Counselor/Therapist Progress Note  Patient ID: KENITA BINES, MRN: [id number],    Date: 01/20/2022  Time Spent: 4:00pm-4:50pm    50 minutes   Treatment Type: Individual Therapy  Reported Symptoms: confusion, loneliness  Mental Status Exam: Appearance:  Casual     Behavior: Appropriate  Motor: Normal  Speech/Language:  Normal Rate  Affect: Appropriate  Mood: normal  Thought process: normal  Thought content:   WNL  Sensory/Perceptual disturbances:   WNL  Orientation: oriented to person, place, time/date, and situation  Attention: Good  Concentration: Good  Memory: WNL  Fund of knowledge:  Good  Insight:   Good  Judgment:  Good  Impulse Control: Good   Risk Assessment: Danger to Self:  No Self-injurious Behavior: No Danger to Others: No Duty to Warn:no Physical Aggression / Violence:No  Access to Firearms a concern: No  Gang Involvement:No   Subjective:  [name] present for face-to-face individual therapy via video Webex.  Pt consents to telehealth video session due to COVID 19 pandemic. Location of pt: home Location of therapist: home office.  Pt has talked to her hair dresser about renting her garage apartment if the timing works out well.    Pt is holding onto this option when she  thinks about wanting to leave her husband.    Pt feels like she is a caregiver instead of a wife.   Pt has tried to do more things that take care of herself and has engaged in planning and getting things done.  This has helped pt feel more in control. Pt and husband have some trips planned  but her husband is infirm so traveling can be challenging.    Pt gets anxious at times bc of feeling "stuck and trapped" in her husband's home.   Provided supportive therapy.    Interventions: Cognitive Behavioral Therapy and Insight-Oriented  Diagnosis: F33.1  Plan: Plan to meet in two weeks.  Pt is progressing toward treatment goals.   Plan of Care: Recommend ongoing therapy.   Pt participated  in setting treatment goals.  Pt wants to have someone to talk to and to improve coping skills.  Pt wants to feel less anxious and depressed.  Plan to meet every two weeks.    Treatment Plan (Treatment Plan Target Date: 10/26/2022) Client Abilities/Strengths  Pt is bright, engaging, and motivated for therapy.   Client Treatment Preferences  Individual therapy.  Client Statement of Needs  Improve coping skills.  Symptoms  Depressed or irritable mood. Diminished interest in or enjoyment of activities. Lack of energy. Feelings of hopelessness, worthlessness, or inappropriate guilt. Unresolved grief issues.   Problems Addressed  Unipolar Depression Goals 1. Alleviate depressive symptoms and return to previous level of effective functioning. 2. Appropriately grieve the loss in order to normalize mood and to return to previously adaptive level of functioning. Objective Learn and implement behavioral strategies to overcome depression. Target Date: 2022-10-26 Frequency: Biweekly  Progress: 10 Modality: individual  Related Interventions Engage the client in "behavioral activation," increasing his/her activity level and contact with sources of reward, while identifying processes that inhibit activation.  Use behavioral techniques such as instruction, rehearsal, role-playing, role reversal, as needed, to facilitate activity in the client's daily life; reinforce success. Assist the client in developing skills that increase the likelihood of deriving pleasure from behavioral activation (e.g., assertiveness skills, developing an exercise plan, less internal/more external focus, increased social involvement); reinforce success. Objective Identify important people in life, past and present, and describe the quality, good and poor, of those relationships. Target Date: 2022-10-26 Frequency: Biweekly  Progress: 10 Modality: individual  Related Interventions Conduct Interpersonal Therapy beginning with  the assessment of the client's "interpersonal inventory" of important past and present relationships; develop a case formulation linking depression to grief, interpersonal role disputes, role transitions, and/or interpersonal deficits). Objective Learn and implement problem-solving and decision-making skills. Target Date: 2022-10-26 Frequency: Biweekly  Progress: 10 Modality: individual  Related Interventions Conduct Problem-Solving Therapy using techniques such as psychoeducation, modeling, and role-playing to teach client problem-solving skills (i.e., defining a problem specifically, generating possible solutions, evaluating the pros and cons of each solution, selecting and implementing a plan of action, evaluating the efficacy of the plan, accepting or revising the plan); role-play application of the problem-solving skill to a real life issue. Encourage in the client the development of a positive problem orientation in which problems and solving them are viewed as a natural part of life and not something to be feared, despaired, or avoided. 3. Develop healthy interpersonal relationships that lead to the alleviation and help prevent the relapse of depression. 4. Develop healthy thinking patterns and beliefs about self, others, and the world that lead to the alleviation and help prevent the relapse of depression. 5. Recognize, accept, and cope with feelings of depression. Diagnosis F33.1  Conditions For Discharge Achievement of treatment goals and objectives   [name], LCSW

## 2022-01-24 ENCOUNTER — Ambulatory Visit: Payer: Medicare Other | Admitting: Obstetrics & Gynecology

## 2022-02-09 ENCOUNTER — Other Ambulatory Visit (HOSPITAL_COMMUNITY)
Admission: RE | Admit: 2022-02-09 | Discharge: 2022-02-09 | Disposition: A | Discharge status: Home / Self Care | Payer: Medicare Other | Source: Ambulatory Visit | Attending: Obstetrics & Gynecology | Admitting: Obstetrics & Gynecology

## 2022-02-09 ENCOUNTER — Encounter: Payer: Self-pay | Admitting: Obstetrics & Gynecology

## 2022-02-09 ENCOUNTER — Ambulatory Visit (INDEPENDENT_AMBULATORY_CARE_PROVIDER_SITE_OTHER): Payer: Medicare Other | Admitting: Obstetrics & Gynecology

## 2022-02-09 VITALS — BP 118/76 | HR 74 | Ht 64.75 in | Wt 143.0 lb

## 2022-02-09 DIAGNOSIS — Z01419 Encounter for gynecological examination (general) (routine) without abnormal findings: Secondary | ICD-10-CM | POA: Insufficient documentation

## 2022-02-09 DIAGNOSIS — B001 Herpesviral vesicular dermatitis: Secondary | ICD-10-CM | POA: Diagnosis not present

## 2022-02-09 DIAGNOSIS — Z780 Asymptomatic menopausal state: Secondary | ICD-10-CM | POA: Diagnosis not present

## 2022-02-09 DIAGNOSIS — M810 Age-related osteoporosis without current pathological fracture: Secondary | ICD-10-CM

## 2022-02-09 DIAGNOSIS — Z1272 Encounter for screening for malignant neoplasm of vagina: Secondary | ICD-10-CM | POA: Insufficient documentation

## 2022-02-09 DIAGNOSIS — Z9189 Other specified personal risk factors, not elsewhere classified: Secondary | ICD-10-CM

## 2022-02-09 MED ORDER — DENOSUMAB 60 MG/ML ~~LOC~~ SOSY
60.0000 mg | PREFILLED_SYRINGE | Freq: Once | SUBCUTANEOUS | Status: AC
  Administered 2022-02-09: 60 mg via SUBCUTANEOUS

## 2022-02-09 NOTE — Progress Notes (Signed)
Sandra Craig 15-Oct-1940 [id number]   History:    81 y.o. G1P0A1 Remarried x 3 years  RP:  Established patient presenting for annual gyn exam   HPI: S/P TAH/BSO for Endometriosis.  Postmenopause, well on no HRT.  No pelvic pain.  Not having intercourse, but having oral sex.  Husband Dxed with an HPV + Throat Ca a year ago.  Last Pap Neg in 2011.  Breasts normal.  Mammo Neg 03/2021.  BD Osteopenia with T-Score at -2.2 at the Lt Femoral Neck in 03/2021 on Prolia.  AP Spine showed increased BD and Hips were stable.  BMI 23.98.  Health labs with Fam MD.   Past medical history,surgical history, family history and social history were all reviewed and documented in the EPIC chart.  Gynecologic History Patient's last menstrual period was 08/25/1992.  Obstetric History OB History  Gravida Para Term Preterm AB Living  1 0     1 0  SAB IAB Ectopic Multiple Live Births  1            # Outcome Date GA Lbr Len/2nd Weight Sex Delivery Anes PTL Lv  1 SAB              ROS: A ROS was performed and pertinent positives and negatives are included in the history.  GENERAL: No fevers or chills. HEENT: No change in vision, no earache, sore throat or sinus congestion. NECK: No pain or stiffness. CARDIOVASCULAR: No chest pain or pressure. No palpitations. PULMONARY: No shortness of breath, cough or wheeze. GASTROINTESTINAL: No abdominal pain, nausea, vomiting or diarrhea, melena or bright red blood per rectum. GENITOURINARY: No urinary frequency, urgency, hesitancy or dysuria. MUSCULOSKELETAL: No joint or muscle pain, no back pain, no recent trauma. DERMATOLOGIC: No rash, no itching, no lesions. ENDOCRINE: No polyuria, polydipsia, no heat or cold intolerance. No recent change in weight. HEMATOLOGICAL: No anemia or easy bruising or bleeding. NEUROLOGIC: No headache, seizures, numbness, tingling or weakness. PSYCHIATRIC: No depression, no loss of interest in normal activity or change in sleep pattern.      Exam:   BP 118/76   Pulse 74   Ht 5' 4.75" (1.645 m)   Wt 143 lb (64.9 kg)   LMP 08/25/1992   SpO2 96%   BMI 23.98 kg/m   Body mass index is 23.98 kg/m.  General appearance : Well developed well nourished female. No acute distress HEENT: Eyes: no retinal hemorrhage or exudates,  Neck supple, trachea midline, no carotid bruits, no thyroidmegaly Lungs: Clear to auscultation, no rhonchi or wheezes, or rib retractions  Heart: Regular rate and rhythm, no murmurs or gallops Breast:Examined in sitting and supine position were symmetrical in appearance, no palpable masses or tenderness,  no skin retraction, no nipple inversion, no nipple discharge, no skin discoloration, no axillary or supraclavicular lymphadenopathy Abdomen: no palpable masses or tenderness, no rebound or guarding Extremities: no edema or skin discoloration or tenderness  Pelvic: Vulva: Normal             Vagina: No gross lesions or discharge.  Pap reflex at the entrance of the vagina.  Cervix/Uterus absent  Adnexa  Without masses or tenderness  Anus: Normal   Assessment/Plan:  81 y.o. female for annual exam   1. Encounter for Papanicolaou smear of vagina as part of routine gynecological examination S/P TAH/BSO for Endometriosis.  Postmenopause, well on no HRT.  No pelvic pain.  Not having intercourse, but having oral sex.  Husband Dxed with an  HPV + Throat Ca a year ago.  Last Pap Neg in 2011.  Breasts normal.  Mammo Neg 03/2021.  BD Osteopenia with T-Score at -2.2 at the Lt Femoral Neck in 03/2021 on Prolia.  AP Spine showed increased BD and Hips were stable.  BMI 23.98.  Health labs with Fam MD.  - Cytology - PAP( [facility])  2. Postmenopausal S/P TAH/BSO for Endometriosis.  Postmenopause, well on no HRT.  No pelvic pain.   3. Age-related osteoporosis without current pathological fracture  BD Osteopenia with T-Score at -2.2 at the Lt Femoral Neck in 03/2021 on Prolia.  AP Spine showed increased BD and Hips were  stable.  Recommend continuing on Prolia.  Injection today.    Princess Bruins MD, 12:14 PM 02/09/2022

## 2022-02-10 DIAGNOSIS — N281 Cyst of kidney, acquired: Secondary | ICD-10-CM | POA: Diagnosis not present

## 2022-02-10 DIAGNOSIS — Z8673 Personal history of transient ischemic attack (TIA), and cerebral infarction without residual deficits: Secondary | ICD-10-CM | POA: Diagnosis not present

## 2022-02-10 DIAGNOSIS — N1832 Chronic kidney disease, stage 3b: Secondary | ICD-10-CM | POA: Diagnosis not present

## 2022-02-10 DIAGNOSIS — N184 Chronic kidney disease, stage 4 (severe): Secondary | ICD-10-CM | POA: Diagnosis not present

## 2022-02-10 DIAGNOSIS — I129 Hypertensive chronic kidney disease with stage 1 through stage 4 chronic kidney disease, or unspecified chronic kidney disease: Secondary | ICD-10-CM | POA: Diagnosis not present

## 2022-02-10 LAB — BASIC METABOLIC PANEL
BUN: 24 — AB (ref 4–21)
CO2: 30 — AB (ref 13–22)
Chloride: 103 (ref 99–108)
Creatinine: 1.6 — AB (ref 0.5–1.1)
Glucose: 90
Potassium: 4.5 mEq/L (ref 3.5–5.1)
Sodium: 140 (ref 137–147)

## 2022-02-10 LAB — COMPREHENSIVE METABOLIC PANEL
Albumin: 4.2 (ref 3.5–5.0)
Calcium: 9.5 (ref 8.7–10.7)
eGFR: 34

## 2022-02-11 LAB — CYTOLOGY - PAP: Diagnosis: NEGATIVE

## 2022-02-12 ENCOUNTER — Other Ambulatory Visit: Payer: Self-pay | Admitting: Family Medicine

## 2022-03-08 ENCOUNTER — Ambulatory Visit: Payer: Medicare Other | Admitting: Family Medicine

## 2022-03-16 ENCOUNTER — Ambulatory Visit (INDEPENDENT_AMBULATORY_CARE_PROVIDER_SITE_OTHER): Payer: Medicare Other | Admitting: Psychology

## 2022-03-16 DIAGNOSIS — F331 Major depressive disorder, recurrent, moderate: Secondary | ICD-10-CM

## 2022-03-16 NOTE — Progress Notes (Signed)
[facility] Counselor/Therapist Progress Note  Patient ID: KORRI ASK, MRN: [id number],    Date: 03/16/2022  Time Spent: 3:00pm-3:50pm    50 minutes   Treatment Type: Individual Therapy  Reported Symptoms: loneliness, stress  Mental Status Exam: Appearance:  Casual     Behavior: Appropriate  Motor: Normal  Speech/Language:  Normal Rate  Affect: Appropriate  Mood: normal  Thought process: normal  Thought content:   WNL  Sensory/Perceptual disturbances:   WNL  Orientation: oriented to person, place, time/date, and situation  Attention: Good  Concentration: Good  Memory: WNL  Fund of knowledge:  Good  Insight:   Good  Judgment:  Good  Impulse Control: Good   Risk Assessment: Danger to Self:  No Self-injurious Behavior: No Danger to Others: No Duty to Warn:no Physical Aggression / Violence:No  Access to Firearms a concern: No  Gang Involvement:No   Subjective:  [name] present for face-to-face individual therapy via video Webex.  Pt consents to telehealth video session due to COVID 19 pandemic. Location of pt: home Location of therapist: home office.  Pt talked about making plans with her sister to possibly buy a house together.   She also has the possibility of renting a friend's garage apartment.  Pt is torn about what decisions to make.   Worked on Control and instrumentation engineer.   Pt's husband's son said he wants their house when pt's husband passes.    Pt is concerned about her memory.   Pt has had some panic attacks bc of worrying about her situation.  Addressed pt's worries and worked on calming strategies.   Pt talked about being unhappy in her marriage. Pt states her husband says he loves her but she does not feel married.  She feels like she is in an "arrangement".   Pt feels like the arrangement is not fair or balanced.   Pt feels "less than" in the marriage.  Pt has realized she needs to branch out and do some things and connect with friends that make  her feel good and get her out of the house.   Pt has started to go back to [place] for the social support.  Pt has been sober for 46 years.  She feels a need to get involved in a small group so she has increased social connection.  Pt has made contact with her [place] sponsor.     Provided supportive therapy.    Interventions: Cognitive Behavioral Therapy and Insight-Oriented  Diagnosis: F33.1  Plan: Plan to meet in two weeks.  Pt is progressing toward treatment goals.   Plan of Care: Recommend ongoing therapy.   Pt participated in setting treatment goals.  Pt wants to have someone to talk to and to improve coping skills.  Pt wants to feel less anxious and depressed.  Plan to meet every two weeks.    Treatment Plan (Treatment Plan Target Date: 10/26/2022) Client Abilities/Strengths  Pt is bright, engaging, and motivated for therapy.   Client Treatment Preferences  Individual therapy.  Client Statement of Needs  Improve coping skills.  Symptoms  Depressed or irritable mood. Diminished interest in or enjoyment of activities. Lack of energy. Feelings of hopelessness, worthlessness, or inappropriate guilt. Unresolved grief issues.   Problems Addressed  Unipolar Depression Goals 1. Alleviate depressive symptoms and return to previous level of effective functioning. 2. Appropriately grieve the loss in order to normalize mood and to return to previously adaptive level of functioning. Objective Learn and implement behavioral strategies to  overcome depression. Target Date: 2022-10-26 Frequency: Biweekly  Progress: 10 Modality: individual  Related Interventions Engage the client in "behavioral activation," increasing his/her activity level and contact with sources of reward, while identifying processes that inhibit activation.  Use behavioral techniques such as instruction, rehearsal, role-playing, role reversal, as needed, to facilitate activity in the client's daily life; reinforce success. Assist  the client in developing skills that increase the likelihood of deriving pleasure from behavioral activation (e.g., assertiveness skills, developing an exercise plan, less internal/more external focus, increased social involvement); reinforce success. Objective Identify important people in life, past and present, and describe the quality, good and poor, of those relationships. Target Date: 2022-10-26 Frequency: Biweekly  Progress: 10 Modality: individual  Related Interventions Conduct Interpersonal Therapy beginning with the assessment of the client's "interpersonal inventory" of important past and present relationships; develop a case formulation linking depression to grief, interpersonal role disputes, role transitions, and/or interpersonal deficits). Objective Learn and implement problem-solving and decision-making skills. Target Date: 2022-10-26 Frequency: Biweekly  Progress: 10 Modality: individual  Related Interventions Conduct Problem-Solving Therapy using techniques such as psychoeducation, modeling, and role-playing to teach client problem-solving skills (i.e., defining a problem specifically, generating possible solutions, evaluating the pros and cons of each solution, selecting and implementing a plan of action, evaluating the efficacy of the plan, accepting or revising the plan); role-play application of the problem-solving skill to a real life issue. Encourage in the client the development of a positive problem orientation in which problems and solving them are viewed as a natural part of life and not something to be feared, despaired, or avoided. 3. Develop healthy interpersonal relationships that lead to the alleviation and help prevent the relapse of depression. 4. Develop healthy thinking patterns and beliefs about self, others, and the world that lead to the alleviation and help prevent the relapse of depression. 5. Recognize, accept, and cope with feelings of  depression. Diagnosis F33.1  Conditions For Discharge Achievement of treatment goals and objectives   [name], LCSW

## 2022-03-18 ENCOUNTER — Ambulatory Visit: Payer: Medicare Other | Admitting: Family Medicine

## 2022-03-22 ENCOUNTER — Ambulatory Visit (INDEPENDENT_AMBULATORY_CARE_PROVIDER_SITE_OTHER): Payer: Medicare Other | Admitting: Family Medicine

## 2022-03-22 ENCOUNTER — Encounter: Payer: Self-pay | Admitting: Family Medicine

## 2022-03-22 VITALS — BP 130/64 | HR 76 | Temp 98.3°F | Ht 64.75 in | Wt 145.4 lb

## 2022-03-22 DIAGNOSIS — R7989 Other specified abnormal findings of blood chemistry: Secondary | ICD-10-CM | POA: Diagnosis not present

## 2022-03-22 DIAGNOSIS — R1011 Right upper quadrant pain: Secondary | ICD-10-CM | POA: Diagnosis not present

## 2022-03-22 DIAGNOSIS — F5101 Primary insomnia: Secondary | ICD-10-CM | POA: Diagnosis not present

## 2022-03-22 DIAGNOSIS — I10 Essential (primary) hypertension: Secondary | ICD-10-CM

## 2022-03-22 LAB — COMPREHENSIVE METABOLIC PANEL
ALT: 60 U/L — ABNORMAL HIGH (ref 0–35)
AST: 52 U/L — ABNORMAL HIGH (ref 0–37)
Albumin: 4 g/dL (ref 3.5–5.2)
Alkaline Phosphatase: 57 U/L (ref 39–117)
BUN: 25 mg/dL — ABNORMAL HIGH (ref 6–23)
CO2: 33 mEq/L — ABNORMAL HIGH (ref 19–32)
Calcium: 9.6 mg/dL (ref 8.4–10.5)
Chloride: 104 mEq/L (ref 96–112)
Creatinine, Ser: 1.63 mg/dL — ABNORMAL HIGH (ref 0.40–1.20)
GFR: 29.73 mL/min — ABNORMAL LOW (ref >60.00)
Glucose, Bld: 84 mg/dL (ref 70–99)
Potassium: 5 mEq/L (ref 3.5–5.1)
Sodium: 141 mEq/L (ref 135–145)
Total Bilirubin: 0.4 mg/dL (ref 0.2–1.2)
Total Protein: 6.7 g/dL (ref 6.0–8.3)

## 2022-03-22 NOTE — Patient Instructions (Addendum)
Flu shot- we should have these available within a month or two but please let us know if you get at outside pharmacy -also due for Tdap and shingrix  We will call you within two weeks about your referral for ultrasound. If you do not hear within 2 weeks, give [place] a call.   Please stop by lab before you go If you have mychart- we will send your results within 3 business days of [place] receiving them.  If you do not have mychart- we will call you about results within 5 business days of [place] receiving them.  *please also note that you will see labs on mychart as soon as they post. I will later go in and write notes on them- will say "notes from Dr. Yong Channel"   Recommended follow up: Return in about 3 months (around 06/22/2022) for followup or sooner if needed.Schedule b4 you leave.

## 2022-03-22 NOTE — Progress Notes (Signed)
Phone [phone] In person visit   Subjective:   Sandra Craig is a 81 y.o. year old very pleasant female patient who presents for/with See problem oriented charting Chief Complaint  Patient presents with   Rib Pain    Pt c/o right sided rib pain that has been present for about a month and pain is remaining the same 3/10 this occurred after falling out of the bed and landing on her right side.   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    Priority: High   Chronic low back pain 12/20/2013    Priority: High   Fibromyalgia 12/05/2007    Priority: High   UTI (urinary tract infection) 12/15/2018    Priority: Medium    CKD (chronic kidney disease), stage III ([place]) 06/02/2017    Priority: Medium    Near syncope 03/11/2017    Priority: Medium    History of small bowel obstruction 06/28/2011    Priority: Medium    GERD with stricture 06/28/2011    Priority: Medium    Polycystic kidney disease     Priority: Medium    Hypertension     Priority: Medium    Hyperlipidemia     Priority: Medium    Osteoporosis 12/21/2009    Priority: Medium    INSOMNIA, CHRONIC 10/08/2008    Priority: Medium    Depression 02/08/2008    Priority: Medium    Facet arthropathy 09/14/2019    Priority: Low   Vaginal dryness 01/29/2019    Priority: Low   Chronic right shoulder pain 03/07/2014    Priority: Low   Benign essential tremor 10/09/2013    Priority: Low   Hyperglycemia 06/27/2012    Priority: Low   Diverticulitis large intestine 02/11/2012    Priority: Low   Irritable bowel syndrome (IBS) 06/28/2011    Priority: Low   IC (interstitial cystitis)     Priority: Low   Hemorrhoids 10/08/2008    Priority: Low   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 30 tablet    calcium-vitamin D 250-100 MG-UNIT tablet Take 1 tablet by mouth 2 (two) times daily.     DULoxetine (CYMBALTA) 30 MG capsule TAKE ONE CAPSULE BY MOUTH TWICE DAILY 180 capsule 3   famotidine (PEPCID) 20 MG tablet TAKE 1 TABLET(20 MG) BY MOUTH TWICE DAILY 180 tablet 1   gabapentin (NEURONTIN) 300 MG capsule TAKE 1 CAPSULE(300 MG) BY MOUTH TWICE DAILY AS NEEDED FOR PAIN 180 capsule 3   Multiple Vitamins-Minerals (MULTIVITAMIN WITH MINERALS) tablet Take 1 tablet by mouth daily.     Polyethyl Glycol-Propyl Glycol (SYSTANE) 0.4-0.3 % GEL ophthalmic gel Place 1 application into both eyes 3 (three) times daily as needed (dry eye).     rosuvastatin (CRESTOR) 40 MG tablet TAKE 1 TABLET(40 MG) BY MOUTH DAILY 90 tablet 3   zolpidem (AMBIEN) 5 MG tablet TAKE 1 TABLET BY MOUTH EVERY DAY AT BEDTIME FOR SLEEP 90 tablet  1   losartan (COZAAR) 25 MG tablet Take 12.5 mg by mouth daily.     No current facility-administered medications for this visit.     Objective:  BP 130/64   Pulse 76   Temp 98.3 F (36.8 C)   Ht 5' 4.75" (1.645 m)   Wt 145 lb 6.4 oz (66 kg)   LMP 08/25/1992   SpO2 96%   BMI 24.38 kg/m  Gen: NAD, resting comfortably Mild pain just under nose CV: RRR no murmurs rubs or gallops Lungs: CTAB no crackles, wheeze, rhonchi Abdomen: soft/nontender other than mild pain RUQ/nondistended/normal bowel sounds. No rebound or guarding.  Ext: no edema Skin: warm, dry     Assessment and Plan   #social update- has 81th bday coming up on same day as anniversary- leaning toward celebrating with the family -[place] for 3 months starting December 28th so we want to check in before she  leaves  #Right-sided rib pain #insomnia S: Has noted over the last month.  Pain about 3 out of 10 and overall stable.  Started after falling out of bed and landing on her right side about a month ago- after that had another fall shortly after and hit nose- some soreness- did have short LOC- no headaches above baseline. No facial or extremity weakness. No slurred words or trouble swallowing. no blurry vision or double vision. No paresthesias. No confusion (above baseline) or word finding difficulties.   She reports dreaming that she is fighting and running away- reaches for something to stay out of way from someone from dream and has fallen out of bed twice. Got a rail to prevent falls but still getting vivid dreams.  A/P: right sided rib pain- actually just under her ribs- given fact also has mild LFT elevations- offered [place] of that area and she agrees   As far as other fall where hit nose and had LOC- she had an MRI 2 weeks ago through [facility] of [employer] [facility] and was told reassuring- will hold off on further neuroimaging. Prior considered MMSE but states this is being studied- also hearing being evaluated. Mild pain at base of nose- offered ENT referral but wants to hold off  For insomnia- good control of sleep but with vivid dreams on ambien recommended trial 2.5 mg and if that still causes them may need to stop- especially if further falls out of bed but thankful she got a rail -also has noted some memory loss but severe issues if misses dose and want s to continue for now -could consider rozerem but had trialed before with DR. Shawna Orleans  # Mild LFT elevations S:tries to eat reasonably healthy. Weight is in normal range.  Last drink 1976.  Lab Results  Component Value Date   ALT 55 (H) 12/07/2021   AST 50 (H) 12/07/2021   ALKPHOS 59 12/07/2021   BILITOT 0.4 12/07/2021  A/P: mild elevations- statin could contribute. Could have mild underlying fatty liver- further workup only if  #s elevate - will get [place] due to RUQ pain as listed above  #hypertension S: medication: Amlodipine 1.25 mg before bed in past now off, losartan 12.5 mg restarted by Dr. Hollie Salk- prior presyncope but was having elevated microalbumin/cr ratio compared to prior  - fist few days some lightheadedness but is doing better now despite taking in am Home readings #s: 130s or so at home- prefer over 120 BP Readings from Last 3 Encounters:  03/22/22 130/64  02/09/22 118/76  12/07/21 122/80  A/P: blood pressure looks great with recent modification by Dr. Hollie Salk- we will update cmp to evaluate creatinine (around 1.5-1.6 on last check) as well as potassium.    Recommended follow up: Return in about 3 months (around 06/22/2022) for followup or sooner if needed.Schedule b4 you leave. Future Appointments  Date Time Provider [place]  04/18/2022 10:00 AM Bauert, Nicolasa Ducking, LCSW [facility] None  05/27/2022 10:00 AM Bauert, Nicolasa Ducking, LCSW [facility] None  10/24/2022  3:00 PM [facility] CCM PHARMACIST [facility]  12/29/2022 10:30 AM [facility] HEALTH COACH [facility]   Lab/Order associations:   ICD-10-CM   1. Primary hypertension  I10 Comprehensive metabolic panel    2. LFT elevation  R79.89 US Abdomen Limited RUQ (LIVER/GB)    3. RUQ pain  R10.11 US Abdomen Limited RUQ (LIVER/GB)    4. Primary insomnia  F51.01      No orders of the defined types were placed in this encounter.  Return precautions advised.  Garret Reddish, MD

## 2022-03-24 ENCOUNTER — Other Ambulatory Visit: Payer: Medicare Other

## 2022-03-25 ENCOUNTER — Ambulatory Visit
Admission: RE | Admit: 2022-03-25 | Discharge: 2022-03-25 | Disposition: A | Discharge status: Home / Self Care | Payer: Medicare Other | Source: Ambulatory Visit | Attending: Family Medicine | Admitting: Family Medicine

## 2022-03-25 DIAGNOSIS — R1011 Right upper quadrant pain: Secondary | ICD-10-CM

## 2022-03-25 DIAGNOSIS — Z9049 Acquired absence of other specified parts of digestive tract: Secondary | ICD-10-CM | POA: Diagnosis not present

## 2022-03-25 DIAGNOSIS — K7689 Other specified diseases of liver: Secondary | ICD-10-CM | POA: Diagnosis not present

## 2022-03-25 DIAGNOSIS — R7989 Other specified abnormal findings of blood chemistry: Secondary | ICD-10-CM

## 2022-04-13 DIAGNOSIS — D2261 Melanocytic nevi of right upper limb, including shoulder: Secondary | ICD-10-CM | POA: Diagnosis not present

## 2022-04-13 DIAGNOSIS — D1801 Hemangioma of skin and subcutaneous tissue: Secondary | ICD-10-CM | POA: Diagnosis not present

## 2022-04-13 DIAGNOSIS — D692 Other nonthrombocytopenic purpura: Secondary | ICD-10-CM | POA: Diagnosis not present

## 2022-04-13 DIAGNOSIS — C44519 Basal cell carcinoma of skin of other part of trunk: Secondary | ICD-10-CM | POA: Diagnosis not present

## 2022-04-13 DIAGNOSIS — L820 Inflamed seborrheic keratosis: Secondary | ICD-10-CM | POA: Diagnosis not present

## 2022-04-13 DIAGNOSIS — L821 Other seborrheic keratosis: Secondary | ICD-10-CM | POA: Diagnosis not present

## 2022-04-15 ENCOUNTER — Other Ambulatory Visit: Payer: Self-pay | Admitting: Family Medicine

## 2022-04-18 ENCOUNTER — Ambulatory Visit: Payer: Medicare Other | Admitting: Psychology

## 2022-04-18 DIAGNOSIS — F331 Major depressive disorder, recurrent, moderate: Secondary | ICD-10-CM

## 2022-04-21 ENCOUNTER — Telehealth: Payer: Self-pay | Admitting: Pharmacist

## 2022-04-21 NOTE — Progress Notes (Signed)
Chronic Care Management Pharmacy Assistant   Name: Sandra Craig  MRN: [id number] DOB: 1940-09-04   Reason for Encounter: Hypertension Adherence Call    Recent office visits:  03/22/2022 [facility] (PCP) [name], MD; no medication changes noted.  12/07/2021 [facility] (PCP) [name], MD; Amlodipine 1.25 mg before bed-can take an additional half tablet if needed if blood pressure over 165/100- hasnt needed lately.  11/02/2021 [facility] (PCP) [name], MD; Augment for UTI  Recent consult visits:  02/09/2022 [facility] (Gynecology) Princess Bruins, MD; no medication changes noted.  Hospital visits:  None in previous 6 months  Medications: Outpatient Encounter Medications as of 04/21/2022  Medication Sig Note   aspirin 81 MG tablet Take 1 tablet (81 mg total) by mouth daily.    calcium-vitamin D 250-100 MG-UNIT tablet Take 1 tablet by mouth 2 (two) times daily.    DULoxetine (CYMBALTA) 30 MG capsule TAKE ONE CAPSULE BY MOUTH TWICE DAILY 10/12/2021: Two qam and one PM   famotidine (PEPCID) 20 MG tablet TAKE 1 TABLET(20 MG) BY MOUTH TWICE DAILY    gabapentin (NEURONTIN) 300 MG capsule TAKE 1 CAPSULE(300 MG) BY MOUTH TWICE DAILY AS NEEDED FOR PAIN    losartan (COZAAR) 25 MG tablet Take 12.5 mg by mouth daily.    Multiple Vitamins-Minerals (MULTIVITAMIN WITH MINERALS) tablet Take 1 tablet by mouth daily.    Polyethyl Glycol-Propyl Glycol (SYSTANE) 0.4-0.3 % GEL ophthalmic gel Place 1 application into both eyes 3 (three) times daily as needed (dry eye).    rosuvastatin (CRESTOR) 40 MG tablet TAKE 1 TABLET(40 MG) BY MOUTH DAILY    zolpidem (AMBIEN) 5 MG tablet TAKE 1 TABLET BY MOUTH EVERY DAY AT BEDTIME FOR SLEEP    No facility-administered encounter medications on file as of 04/21/2022.   Reviewed chart prior to disease state call. Spoke with patient regarding BP  Recent Office Vitals: BP Readings from Last 3 Encounters:  03/22/22 130/64  02/09/22 118/76  12/07/21 122/80   Pulse  Readings from Last 3 Encounters:  03/22/22 76  02/09/22 74  12/07/21 74    Wt Readings from Last 3 Encounters:  03/22/22 145 lb 6.4 oz (66 kg)  02/09/22 143 lb (64.9 kg)  12/07/21 144 lb (65.3 kg)     Kidney Function Lab Results  Component Value Date/Time   CREATININE 1.63 (H) 03/22/2022 11:46 AM   CREATININE 1.6 (A) 02/10/2022 12:00 AM   CREATININE 1.59 (H) 12/07/2021 09:35 AM   CREATININE 1.61 (H) 07/21/2020 03:59 PM   CREATININE 1.42 (H) 05/15/2020 12:20 PM   GFR 29.73 (L) 03/22/2022 11:46 AM   GFRNONAA 29 (L) 01/03/2021 09:01 AM   GFRNONAA 30 (L) 07/21/2020 03:59 PM   GFRAA 35 (L) 07/21/2020 03:59 PM       Latest Ref Rng & Units 03/22/2022   11:46 AM 02/10/2022   12:00 AM 12/07/2021    9:35 AM  BMP  Glucose 70 - 99 mg/dL 84   88   BUN 6 - 23 mg/dL 25  24  24    Creatinine 0.40 - 1.20 mg/dL 1.63  1.6  1.59   Sodium 135 - 145 mEq/L 141  140  144   Potassium 3.5 - 5.1 mEq/L 5.0  4.5  4.2   Chloride 96 - 112 mEq/L 104  103  106   CO2 19 - 32 mEq/L 33  30  30   Calcium 8.4 - 10.5 mg/dL 9.6  9.5  9.0     Current  antihypertensive regimen:  Losartan 25 mg daily  How often are you checking your Blood Pressure? infrequently  Current home BP readings: none to report, patient states she hasn't been checking lately. She denies any dizziness or HA's  What recent interventions/DTPs have been made by any provider to improve Blood Pressure control since last CPP Visit: No recent interventions or DTPs.  Any recent hospitalizations or ED visits since last visit with CPP? No  What diet changes have been made to improve Blood Pressure Control?  No recent diet changes per patient.  What exercise is being done to improve your Blood Pressure Control?  Patient states she likes to take her dog for walks.  Adherence Review: Is the patient currently on ACE/ARB medication? Yes Does the patient have >5 day gap between last estimated fill dates? No  Care Gaps: Medicare Annual Wellness:  Last AWV 08/01/2019 Hemoglobin A1C: 5.9% on 02/09/2021 Colonoscopy: Completed 09/12/2019 Dexa Scan: Completed Mammogram: Next due on 03/30/2022  Future Appointments  Date Time Provider [place]  05/02/2022 11:00 AM Bauert, Nicolasa Ducking, LCSW [facility] None  05/27/2022 10:00 AM Bauert, Nicolasa Ducking, LCSW [facility] None  06/22/2022 10:00 AM [name], MD [facility]  10/24/2022  3:00 PM [facility] CCM PHARMACIST [facility]  12/29/2022 10:30 AM [facility] HEALTH COACH [facility]   Star Rating Drugs: Losartan 25 mg last filled 02/10/2022 90 DS Rosuvastatin 40 mg last filled 02/11/2022 90 DS  April D Calhoun, [facility] Pharmacist Assistant [phone]

## 2022-04-25 ENCOUNTER — Encounter: Payer: Self-pay | Admitting: *Deleted

## 2022-05-02 ENCOUNTER — Ambulatory Visit (INDEPENDENT_AMBULATORY_CARE_PROVIDER_SITE_OTHER): Payer: Medicare Other | Admitting: Psychology

## 2022-05-02 DIAGNOSIS — F331 Major depressive disorder, recurrent, moderate: Secondary | ICD-10-CM | POA: Diagnosis not present

## 2022-05-02 NOTE — Progress Notes (Signed)
[facility] Counselor/Therapist Progress Note  Patient ID: Sandra Craig, MRN: [id number],    Date: 05/02/2022  Time Spent: 11:00am - 11:50am    50 minutes   Treatment Type: Individual Therapy  Reported Symptoms: stress  Mental Status Exam: Appearance:  Casual     Behavior: Appropriate  Motor: Normal  Speech/Language:  Normal Rate  Affect: Appropriate  Mood: normal  Thought process: normal  Thought content:   WNL  Sensory/Perceptual disturbances:   WNL  Orientation: oriented to person, place, time/date, and situation  Attention: Good  Concentration: Good  Memory: WNL  Fund of knowledge:  Good  Insight:   Good  Judgment:  Good  Impulse Control: Good   Risk Assessment: Danger to Self:  No Self-injurious Behavior: No Danger to Others: No Duty to Warn:no Physical Aggression / Violence:No  Access to Firearms a concern: No  Gang Involvement:No   Subjective:  [name] present for face-to-face individual therapy via video Webex.  Pt consents to telehealth video session due to COVID 19 pandemic. Location of pt: home Location of therapist: home office.  Pt talked about feeling better the past few weeks.  Pt and husband had a vacation and got along well.   Pt has decided to live one day at a time and to stay in the marriage.   She knows she has living arrangement options if she ever feels like she needs to leave.  This helps her feel less anxious.   Pt's step sons made some changes in the house that has helped pt feel more at home in the house.   Pt talked about having a fear of the future.   She does not have the confidence she use to have.  Helped pt process her thoughts and feelings.  Pt is working on being more active and busy.   Pt has started to go back to [place] for the social support.  Pt has been sober for 46 years.   Pt has made contact with her [place] sponsor.     Provided supportive therapy.    Interventions: Cognitive Behavioral Therapy and  Insight-Oriented  Diagnosis: F33.1  Plan: Plan to meet in two weeks.  Pt is progressing toward treatment goals.   Plan of Care: Recommend ongoing therapy.   Pt participated in setting treatment goals.  Pt wants to have someone to talk to and to improve coping skills.  Pt wants to feel less anxious and depressed.  Plan to meet every two weeks.    Treatment Plan (Treatment Plan Target Date: 10/26/2022) Client Abilities/Strengths  Pt is bright, engaging, and motivated for therapy.   Client Treatment Preferences  Individual therapy.  Client Statement of Needs  Improve coping skills.  Symptoms  Depressed or irritable mood. Diminished interest in or enjoyment of activities. Lack of energy. Feelings of hopelessness, worthlessness, or inappropriate guilt. Unresolved grief issues.   Problems Addressed  Unipolar Depression Goals 1. Alleviate depressive symptoms and return to previous level of effective functioning. 2. Appropriately grieve the loss in order to normalize mood and to return to previously adaptive level of functioning. Objective Learn and implement behavioral strategies to overcome depression. Target Date: 2022-10-26 Frequency: Biweekly  Progress: 10 Modality: individual  Related Interventions Engage the client in "behavioral activation," increasing his/her activity level and contact with sources of reward, while identifying processes that inhibit activation.  Use behavioral techniques such as instruction, rehearsal, role-playing, role reversal, as needed, to facilitate activity in the client's daily life; reinforce success. Assist  the client in developing skills that increase the likelihood of deriving pleasure from behavioral activation (e.g., assertiveness skills, developing an exercise plan, less internal/more external focus, increased social involvement); reinforce success. Objective Identify important people in life, past and present, and describe the quality, good and  poor, of those relationships. Target Date: 2022-10-26 Frequency: Biweekly  Progress: 10 Modality: individual  Related Interventions Conduct Interpersonal Therapy beginning with the assessment of the client's "interpersonal inventory" of important past and present relationships; develop a case formulation linking depression to grief, interpersonal role disputes, role transitions, and/or interpersonal deficits). Objective Learn and implement problem-solving and decision-making skills. Target Date: 2022-10-26 Frequency: Biweekly  Progress: 10 Modality: individual  Related Interventions Conduct Problem-Solving Therapy using techniques such as psychoeducation, modeling, and role-playing to teach client problem-solving skills (i.e., defining a problem specifically, generating possible solutions, evaluating the pros and cons of each solution, selecting and implementing a plan of action, evaluating the efficacy of the plan, accepting or revising the plan); role-play application of the problem-solving skill to a real life issue. Encourage in the client the development of a positive problem orientation in which problems and solving them are viewed as a natural part of life and not something to be feared, despaired, or avoided. 3. Develop healthy interpersonal relationships that lead to the alleviation and help prevent the relapse of depression. 4. Develop healthy thinking patterns and beliefs about self, others, and the world that lead to the alleviation and help prevent the relapse of depression. 5. Recognize, accept, and cope with feelings of depression. Diagnosis F33.1  Conditions For Discharge Achievement of treatment goals and objectives   [name], LCSW

## 2022-05-09 DIAGNOSIS — Z1231 Encounter for screening mammogram for malignant neoplasm of breast: Secondary | ICD-10-CM | POA: Diagnosis not present

## 2022-05-09 LAB — HM MAMMOGRAPHY

## 2022-05-12 ENCOUNTER — Encounter: Payer: Self-pay | Admitting: Family Medicine

## 2022-05-18 ENCOUNTER — Other Ambulatory Visit: Payer: Self-pay | Admitting: Family Medicine

## 2022-05-27 ENCOUNTER — Ambulatory Visit (INDEPENDENT_AMBULATORY_CARE_PROVIDER_SITE_OTHER): Payer: Medicare Other | Admitting: Psychology

## 2022-05-27 DIAGNOSIS — F331 Major depressive disorder, recurrent, moderate: Secondary | ICD-10-CM | POA: Diagnosis not present

## 2022-05-27 NOTE — Progress Notes (Signed)
[facility] Counselor/Therapist Progress Note  Patient ID: Sandra Craig, MRN: [id number],    Date: 05/27/2022  Time Spent: 10:00am - 10:50am    50 minutes   Treatment Type: Individual Therapy  Reported Symptoms: stress  Mental Status Exam: Appearance:  Casual     Behavior: Appropriate  Motor: Normal  Speech/Language:  Normal Rate  Affect: Appropriate  Mood: normal  Thought process: normal  Thought content:   WNL  Sensory/Perceptual disturbances:   WNL  Orientation: oriented to person, place, time/date, and situation  Attention: Good  Concentration: Good  Memory: WNL  Fund of knowledge:  Good  Insight:   Good  Judgment:  Good  Impulse Control: Good   Risk Assessment: Danger to Self:  No Self-injurious Behavior: No Danger to Others: No Duty to Warn:no Physical Aggression / Violence:No  Access to Firearms a concern: No  Gang Involvement:No   Subjective:  [name] present for face-to-face individual therapy via video Webex.  Pt consents to telehealth video session due to COVID 19 pandemic. Location of pt: home Location of therapist: home office.  Pt talked about being concerned about her memory and not being able to keep up with things she use to be able to keep up with.  Pt does not feel enjoyment about the things she use to get enjoyment from.   Encouraged pt to talk to her PCP about medication adjustment for her antidepressant.   Pt is feeling a lot of pain with increased arthritis.  Pt has had more swelling and increased pain.   Pt sees her PCP Dr. Yong Channel in November.    Pt will be going to [place] December 22nd for 3 months.   Pt is worried about the trip.  She will be there for 3 months.  She is not as social as the group that will be there.  Pt tends to feel "less than".   Pt's best friend of 38 years has vascular dementia and is getting worse.  It is hard for pt to see her best friend decline.  Helped pt process her feelings and grief.     Worked  on self care strategies.   Provided supportive therapy.    Interventions: Cognitive Behavioral Therapy and Insight-Oriented  Diagnosis: F33.1  Plan: Plan to meet in two weeks.  Pt is progressing toward treatment goals.   Plan of Care: Recommend ongoing therapy.   Pt participated in setting treatment goals.  Pt wants to have someone to talk to and to improve coping skills.  Pt wants to feel less anxious and depressed.  Plan to meet every two weeks.    Treatment Plan (Treatment Plan Target Date: 10/26/2022) Client Abilities/Strengths  Pt is bright, engaging, and motivated for therapy.   Client Treatment Preferences  Individual therapy.  Client Statement of Needs  Improve coping skills.  Symptoms  Depressed or irritable mood. Diminished interest in or enjoyment of activities. Lack of energy. Feelings of hopelessness, worthlessness, or inappropriate guilt. Unresolved grief issues.   Problems Addressed  Unipolar Depression Goals 1. Alleviate depressive symptoms and return to previous level of effective functioning. 2. Appropriately grieve the loss in order to normalize mood and to return to previously adaptive level of functioning. Objective Learn and implement behavioral strategies to overcome depression. Target Date: 2022-10-26 Frequency: Biweekly  Progress: 10 Modality: individual  Related Interventions Engage the client in "behavioral activation," increasing his/her activity level and contact with sources of reward, while identifying processes that inhibit activation.  Use behavioral  techniques such as instruction, rehearsal, role-playing, role reversal, as needed, to facilitate activity in the client's daily life; reinforce success. Assist the client in developing skills that increase the likelihood of deriving pleasure from behavioral activation (e.g., assertiveness skills, developing an exercise plan, less internal/more external focus, increased social involvement); reinforce  success. Objective Identify important people in life, past and present, and describe the quality, good and poor, of those relationships. Target Date: 2022-10-26 Frequency: Biweekly  Progress: 10 Modality: individual  Related Interventions Conduct Interpersonal Therapy beginning with the assessment of the client's "interpersonal inventory" of important past and present relationships; develop a case formulation linking depression to grief, interpersonal role disputes, role transitions, and/or interpersonal deficits). Objective Learn and implement problem-solving and decision-making skills. Target Date: 2022-10-26 Frequency: Biweekly  Progress: 10 Modality: individual  Related Interventions Conduct Problem-Solving Therapy using techniques such as psychoeducation, modeling, and role-playing to teach client problem-solving skills (i.e., defining a problem specifically, generating possible solutions, evaluating the pros and cons of each solution, selecting and implementing a plan of action, evaluating the efficacy of the plan, accepting or revising the plan); role-play application of the problem-solving skill to a real life issue. Encourage in the client the development of a positive problem orientation in which problems and solving them are viewed as a natural part of life and not something to be feared, despaired, or avoided. 3. Develop healthy interpersonal relationships that lead to the alleviation and help prevent the relapse of depression. 4. Develop healthy thinking patterns and beliefs about self, others, and the world that lead to the alleviation and help prevent the relapse of depression. 5. Recognize, accept, and cope with feelings of depression. Diagnosis F33.1  Conditions For Discharge Achievement of treatment goals and objectives   Sandra Bolder, LCSW

## 2022-06-14 DIAGNOSIS — M47816 Spondylosis without myelopathy or radiculopathy, lumbar region: Secondary | ICD-10-CM | POA: Diagnosis not present

## 2022-06-21 ENCOUNTER — Ambulatory Visit (INDEPENDENT_AMBULATORY_CARE_PROVIDER_SITE_OTHER): Payer: Medicare Other | Admitting: Psychology

## 2022-06-21 DIAGNOSIS — F331 Major depressive disorder, recurrent, moderate: Secondary | ICD-10-CM | POA: Diagnosis not present

## 2022-06-21 NOTE — Progress Notes (Signed)
[facility] Counselor/Therapist Progress Note  Patient ID: Sandra Craig, MRN: [id number],    Date: 06/21/2022  Time Spent: 10:00am - 10:50am    50 minutes   Treatment Type: Individual Therapy  Reported Symptoms: stress  Mental Status Exam: Appearance:  Casual     Behavior: Appropriate  Motor: Normal  Speech/Language:  Normal Rate  Affect: Appropriate  Mood: normal  Thought process: normal  Thought content:   WNL  Sensory/Perceptual disturbances:   WNL  Orientation: oriented to person, place, time/date, and situation  Attention: Good  Concentration: Good  Memory: WNL  Fund of knowledge:  Good  Insight:   Good  Judgment:  Good  Impulse Control: Good   Risk Assessment: Danger to Self:  No Self-injurious Behavior: No Danger to Others: No Duty to Warn:no Physical Aggression / Violence:No  Access to Firearms a concern: No  Gang Involvement:No   Subjective:  [name] present for face-to-face individual therapy via video Webex.  Pt consents to telehealth video session due to COVID 19 pandemic. Location of pt: home Location of therapist: home office.  Pt talked about doing better bc she has had plans with her sisters and is getting out and about more.   Pt is working on [employer], broadcasting.    When she is out she does not want to go home bc she does not like how her husband greets her.  He makes her feel guilty for going out.  Pt states she grew up feeling guilt and shame so she does not like feeling that triggered with her husband.   Pt finds herself apologizing a lot.   Helped pt process her feelings and relationship dynamics with husband.  Pt is worried about the trip to [place] for 3 months.  She has arranged for her sister to be there for part of the time which will help.     Pt is concerned about her husband's health.   He has had a lot of health issues since pt and he have been married.   Pt feels like she and her husband are just roommates.    Pt at this point  does not feel like she wants to leave the marriage.   Addressed how pt can get her needs met.   Worked on self care strategies.   Provided supportive therapy.    Interventions: Cognitive Behavioral Therapy and Insight-Oriented  Diagnosis: F33.1  Plan: Plan to meet in two weeks.  Pt is progressing toward treatment goals.   Plan of Care: Recommend ongoing therapy.   Pt participated in setting treatment goals.  Pt wants to have someone to talk to and to improve coping skills.  Pt wants to feel less anxious and depressed.  Plan to meet every two weeks.    Treatment Plan (Treatment Plan Target Date: 10/26/2022) Client Abilities/Strengths  Pt is bright, engaging, and motivated for therapy.   Client Treatment Preferences  Individual therapy.  Client Statement of Needs  Improve coping skills.  Symptoms  Depressed or irritable mood. Diminished interest in or enjoyment of activities. Lack of energy. Feelings of hopelessness, worthlessness, or inappropriate guilt. Unresolved grief issues.   Problems Addressed  Unipolar Depression Goals 1. Alleviate depressive symptoms and return to previous level of effective functioning. 2. Appropriately grieve the loss in order to normalize mood and to return to previously adaptive level of functioning. Objective Learn and implement behavioral strategies to overcome depression. Target Date: 2022-10-26 Frequency: Biweekly  Progress: 10 Modality: individual  Related Interventions Engage  the client in "behavioral activation," increasing his/her activity level and contact with sources of reward, while identifying processes that inhibit activation.  Use behavioral techniques such as instruction, rehearsal, role-playing, role reversal, as needed, to facilitate activity in the client's daily life; reinforce success. Assist the client in developing skills that increase the likelihood of deriving pleasure from behavioral activation (e.g., assertiveness skills,  developing an exercise plan, less internal/more external focus, increased social involvement); reinforce success. Objective Identify important people in life, past and present, and describe the quality, good and poor, of those relationships. Target Date: 2022-10-26 Frequency: Biweekly  Progress: 10 Modality: individual  Related Interventions Conduct Interpersonal Therapy beginning with the assessment of the client's "interpersonal inventory" of important past and present relationships; develop a case formulation linking depression to grief, interpersonal role disputes, role transitions, and/or interpersonal deficits). Objective Learn and implement problem-solving and decision-making skills. Target Date: 2022-10-26 Frequency: Biweekly  Progress: 10 Modality: individual  Related Interventions Conduct Problem-Solving Therapy using techniques such as psychoeducation, modeling, and role-playing to teach client problem-solving skills (i.e., defining a problem specifically, generating possible solutions, evaluating the pros and cons of each solution, selecting and implementing a plan of action, evaluating the efficacy of the plan, accepting or revising the plan); role-play application of the problem-solving skill to a real life issue. Encourage in the client the development of a positive problem orientation in which problems and solving them are viewed as a natural part of life and not something to be feared, despaired, or avoided. 3. Develop healthy interpersonal relationships that lead to the alleviation and help prevent the relapse of depression. 4. Develop healthy thinking patterns and beliefs about self, others, and the world that lead to the alleviation and help prevent the relapse of depression. 5. Recognize, accept, and cope with feelings of depression. Diagnosis F33.1  Conditions For Discharge Achievement of treatment goals and objectives   [name], LCSW

## 2022-06-22 ENCOUNTER — Ambulatory Visit: Payer: Medicare Other | Admitting: Emergency Medicine

## 2022-06-22 ENCOUNTER — Ambulatory Visit (INDEPENDENT_AMBULATORY_CARE_PROVIDER_SITE_OTHER): Payer: Medicare Other | Admitting: Family Medicine

## 2022-06-22 ENCOUNTER — Encounter: Payer: Self-pay | Admitting: Family Medicine

## 2022-06-22 VITALS — BP 126/76 | HR 82 | Temp 97.6°F | Ht 64.75 in | Wt 146.8 lb

## 2022-06-22 DIAGNOSIS — N183 Chronic kidney disease, stage 3 unspecified: Secondary | ICD-10-CM

## 2022-06-22 DIAGNOSIS — R739 Hyperglycemia, unspecified: Secondary | ICD-10-CM

## 2022-06-22 DIAGNOSIS — I10 Essential (primary) hypertension: Secondary | ICD-10-CM | POA: Diagnosis not present

## 2022-06-22 DIAGNOSIS — F3342 Major depressive disorder, recurrent, in full remission: Secondary | ICD-10-CM

## 2022-06-22 LAB — HEMOGLOBIN A1C: Hgb A1c MFr Bld: 6.1 % (ref 4.6–6.5)

## 2022-06-22 LAB — COMPREHENSIVE METABOLIC PANEL
ALT: 47 U/L — ABNORMAL HIGH (ref 0–35)
AST: 44 U/L — ABNORMAL HIGH (ref 0–37)
Albumin: 4.2 g/dL (ref 3.5–5.2)
Alkaline Phosphatase: 61 U/L (ref 39–117)
BUN: 28 mg/dL — ABNORMAL HIGH (ref 6–23)
CO2: 32 mEq/L (ref 19–32)
Calcium: 9.1 mg/dL (ref 8.4–10.5)
Chloride: 107 mEq/L (ref 96–112)
Creatinine, Ser: 1.68 mg/dL — ABNORMAL HIGH (ref 0.40–1.20)
GFR: 28.62 mL/min — ABNORMAL LOW (ref >60.00)
Glucose, Bld: 93 mg/dL (ref 70–99)
Potassium: 4.7 mEq/L (ref 3.5–5.1)
Sodium: 143 mEq/L (ref 135–145)
Total Bilirubin: 0.3 mg/dL (ref 0.2–1.2)
Total Protein: 6.6 g/dL (ref 6.0–8.3)

## 2022-06-22 MED ORDER — ATORVASTATIN CALCIUM 40 MG PO TABS: 40.0000 mg | ORAL_TABLET | Freq: Every day | ORAL | 3 refills | Status: DC

## 2022-06-22 NOTE — Patient Instructions (Addendum)
with creatine clearance close to 30- we opted to stop rosuvastatin 40 mg and start atorvastatin 40 mg  Please stop by lab before you go If you have mychart- we will send your results within 3 business days of [place] receiving them.  If you do not have mychart- we will call you about results within 5 business days of [place] receiving them.  *please also note that you will see labs on mychart as soon as they post. I will later go in and write notes on them- will say "notes from Dr. Yong Channel"   Recommended follow up: Return in about 5 months (around 11/21/2022) for physical or sooner if needed.Schedule b4 you leave.

## 2022-06-22 NOTE — Progress Notes (Signed)
Phone [phone] In person visit   Subjective:   BIRGITTA UHLIR is a 81 y.o. year old very pleasant female patient who presents for/with See problem oriented charting Chief Complaint  Patient presents with   Follow-up   Hypertension   Hyperlipidemia    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[facility]) 10/10/2014    Priority: High   Chronic low back pain 12/20/2013    Priority: High   Fibromyalgia 12/05/2007    Priority: High   UTI (urinary tract infection) 12/15/2018    Priority: Medium    CKD (chronic kidney disease), stage III ([place] [facility]) 06/02/2017    Priority: Medium    Near syncope 03/11/2017    Priority: Medium    Hyperglycemia 06/27/2012    Priority: Medium    History of small bowel obstruction 06/28/2011    Priority: Medium    GERD with stricture 06/28/2011    Priority: Medium    Polycystic kidney disease     Priority: Medium    Hypertension     Priority: Medium    Hyperlipidemia     Priority: Medium    Osteoporosis 12/21/2009    Priority: Medium    INSOMNIA, CHRONIC 10/08/2008    Priority: Medium    Depression 02/08/2008    Priority: Medium    Facet arthropathy 09/14/2019    Priority: Low   Vaginal dryness 01/29/2019    Priority: Low   Chronic right shoulder pain 03/07/2014    Priority: Low   Benign essential tremor 10/09/2013    Priority: Low   Diverticulitis large intestine 02/11/2012    Priority: Low   Irritable bowel syndrome (IBS) 06/28/2011    Priority: Low   IC (interstitial cystitis)     Priority: Low   Hemorrhoids 10/08/2008    Priority: Low   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 30 tablet    atorvastatin (LIPITOR) 40 MG tablet Take 1 tablet (40 mg total) by mouth daily. 90 tablet 3   calcium-vitamin D 250-100 MG-UNIT tablet Take 1 tablet by mouth 2 (two) times daily.     DULoxetine (CYMBALTA) 30 MG capsule TAKE ONE CAPSULE BY MOUTH TWICE DAILY 180 capsule 3   famotidine (PEPCID) 20 MG tablet TAKE 1 TABLET(20 MG) BY MOUTH TWICE DAILY 180 tablet 1   gabapentin (NEURONTIN) 300 MG capsule TAKE 1 CAPSULE(300 MG) BY MOUTH TWICE DAILY AS NEEDED FOR PAIN 180 capsule 3   losartan (COZAAR) 25 MG tablet Take 12.5 mg by mouth daily.     Multiple Vitamins-Minerals (MULTIVITAMIN WITH MINERALS) tablet Take 1 tablet by mouth daily.     Polyethyl Glycol-Propyl Glycol (SYSTANE) 0.4-0.3 % GEL ophthalmic gel Place 1 application into both eyes 3 (three) times daily as needed (dry eye).     zolpidem (AMBIEN) 5 MG tablet TAKE 1 TABLET BY MOUTH EVERY DAY AT BEDTIME FOR SLEEP 90 tablet 1   No current facility-administered medications for this visit.  Objective:  BP 126/76   Pulse 82   Temp 97.6 F (36.4 C)   Ht 5' 4.75" (1.645 m)   Wt 146 lb 12.8 oz (66.6 kg)   LMP 08/25/1992   SpO2 94%   BMI 24.62 kg/m  Gen: NAD, resting comfortably CV: RRR no murmurs rubs or gallops Lungs: CTAB no crackles, wheeze, rhonchi Ext: no edema Skin: warm, dry     Assessment and Plan   # Depression/insomnia #fibromyalgia S: Medication: Cymbalta 30 mg twice daily, Ambien 5 mg causes vivid dreams at times -Failed [place] in the past with Dr. 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 Insomnia controlled. Continue current meds   Fibromyalgia stable on Cymbalta as well  #hypertension with significant orthostatic intolerance history S: medication: Losartan 12.5 mg restarted by Dr. Hollie Salk -prior presyncope but was having elevated microalbumin to creatinine ratio compared to prior - Amlodipine 1.25 mg before bed in the past-stopped with presyncope/syncope issues Home readings #s: often 120s even before takes medicine in the morning A/P: blood pressure controleld today but has not had meds yet- she wants to talk to DR. Hollie Salk concerned about running BP too low   #hyperlipidemia S: Medication:Rosuvastatin 40 mg (may need to change if creatinine clearance under 30-changed to atorvastatin) Lab Results  Component Value Date   CHOL 144 12/07/2021   HDL 62.10 12/07/2021   LDLCALC 63 12/07/2021   LDLDIRECT 59.0 11/03/2020   TRIG 97.0 12/07/2021   CHOLHDL 2 12/07/2021   Lab Results  Component Value Date   ALT 60 (H) 03/22/2022   AST 52 (H) 03/22/2022   ALKPHOS 57 03/22/2022   BILITOT 0.4 03/22/2022   A/P: with creatine clearance close to 30- we opted to stop rosuvastatin 40 mg and start atorvastatin 40 mg - I wonder with creatinine clearance close to this if liver function tests elevated with possible higher circulating levels of rosuvastatin and hoping this helps. RUQ ultrasound was reassuring   # Hyperglycemia/insulin resistance/prediabetes-A1c as high as 5.06 February 2021 S:  Medication: none Exercise and  diet- started at gym- medicue doing some PT including water aquatic tank  for back and balance Lab Results  Component Value Date   HGBA1C 5.8 12/07/2021   HGBA1C 5.9 02/09/2021   HGBA1C 5.4 05/15/2020   A/P: hopefully stable- update a1c today. Continue without meds for now   #Chronic kidney disease stage III S: GFR is typically in the 30srange -Patient knows to avoid NSAIDs  A/P: hopefully stable- update cmp today.  Recommended follow up: Return in about 5 months (around 11/21/2022) for physical or sooner if needed.Schedule b4 you  leave. Future Appointments  Date Time Provider [place]  06/22/2022  3:45 PM Collene Gobble, MD [facility] None  10/24/2022  3:00 PM [facility] CCM PHARMACIST [facility]  12/29/2022 10:30 AM [facility] HEALTH COACH [facility]    Lab/Order associations:   ICD-10-CM   1. Primary hypertension  I10 Comprehensive metabolic panel    2. Hyperglycemia  R73.9 HgB A1c    3. Recurrent major depressive disorder, in full remission ([place])  F33.42     4. Stage 3 chronic kidney disease, unspecified whether stage 3a or 3b CKD ([facility])  N18.30       Meds ordered this encounter  Medications   atorvastatin (LIPITOR) 40 MG tablet    Sig: Take 1 tablet (40 mg total) by mouth daily.    Dispense:  90 tablet    Refill:  3    Return precautions advised.  Garret Reddish, MD

## 2022-07-03 ENCOUNTER — Emergency Department (HOSPITAL_BASED_OUTPATIENT_CLINIC_OR_DEPARTMENT_OTHER): Payer: Medicare Other

## 2022-07-03 ENCOUNTER — Observation Stay (HOSPITAL_BASED_OUTPATIENT_CLINIC_OR_DEPARTMENT_OTHER)
Admission: EM | Admit: 2022-07-03 | Discharge: 2022-07-05 | Disposition: A | Discharge status: Home / Self Care | Payer: Medicare Other | Attending: Family Medicine | Admitting: Family Medicine

## 2022-07-03 ENCOUNTER — Encounter (HOSPITAL_COMMUNITY): Payer: Self-pay

## 2022-07-03 ENCOUNTER — Encounter (HOSPITAL_BASED_OUTPATIENT_CLINIC_OR_DEPARTMENT_OTHER): Payer: Self-pay | Admitting: Emergency Medicine

## 2022-07-03 ENCOUNTER — Other Ambulatory Visit: Payer: Self-pay

## 2022-07-03 DIAGNOSIS — A310 Pulmonary mycobacterial infection: Secondary | ICD-10-CM | POA: Diagnosis not present

## 2022-07-03 DIAGNOSIS — F32A Depression, unspecified: Secondary | ICD-10-CM | POA: Diagnosis present

## 2022-07-03 DIAGNOSIS — M797 Fibromyalgia: Secondary | ICD-10-CM | POA: Diagnosis not present

## 2022-07-03 DIAGNOSIS — J90 Pleural effusion, not elsewhere classified: Secondary | ICD-10-CM | POA: Insufficient documentation

## 2022-07-03 DIAGNOSIS — C37 Malignant neoplasm of thymus: Secondary | ICD-10-CM

## 2022-07-03 DIAGNOSIS — R0602 Shortness of breath: Secondary | ICD-10-CM | POA: Diagnosis not present

## 2022-07-03 DIAGNOSIS — Z8673 Personal history of transient ischemic attack (TIA), and cerebral infarction without residual deficits: Secondary | ICD-10-CM | POA: Insufficient documentation

## 2022-07-03 DIAGNOSIS — J479 Bronchiectasis, uncomplicated: Secondary | ICD-10-CM | POA: Diagnosis not present

## 2022-07-03 DIAGNOSIS — Z79899 Other long term (current) drug therapy: Secondary | ICD-10-CM | POA: Diagnosis not present

## 2022-07-03 DIAGNOSIS — I700 Atherosclerosis of aorta: Secondary | ICD-10-CM | POA: Insufficient documentation

## 2022-07-03 DIAGNOSIS — N1832 Chronic kidney disease, stage 3b: Secondary | ICD-10-CM | POA: Diagnosis present

## 2022-07-03 DIAGNOSIS — E785 Hyperlipidemia, unspecified: Secondary | ICD-10-CM | POA: Diagnosis present

## 2022-07-03 DIAGNOSIS — I10 Essential (primary) hypertension: Secondary | ICD-10-CM | POA: Diagnosis present

## 2022-07-03 DIAGNOSIS — I129 Hypertensive chronic kidney disease with stage 1 through stage 4 chronic kidney disease, or unspecified chronic kidney disease: Secondary | ICD-10-CM | POA: Diagnosis not present

## 2022-07-03 DIAGNOSIS — J9859 Other diseases of mediastinum, not elsewhere classified: Secondary | ICD-10-CM

## 2022-07-03 DIAGNOSIS — J9811 Atelectasis: Secondary | ICD-10-CM | POA: Diagnosis not present

## 2022-07-03 DIAGNOSIS — R079 Chest pain, unspecified: Secondary | ICD-10-CM | POA: Diagnosis not present

## 2022-07-03 DIAGNOSIS — R0789 Other chest pain: Secondary | ICD-10-CM | POA: Diagnosis not present

## 2022-07-03 DIAGNOSIS — N183 Chronic kidney disease, stage 3 unspecified: Secondary | ICD-10-CM | POA: Diagnosis present

## 2022-07-03 DIAGNOSIS — R072 Precordial pain: Principal | ICD-10-CM | POA: Insufficient documentation

## 2022-07-03 DIAGNOSIS — Z85118 Personal history of other malignant neoplasm of bronchus and lung: Secondary | ICD-10-CM

## 2022-07-03 LAB — CBC
HCT: 35.2 % — ABNORMAL LOW (ref 36.0–46.0)
Hemoglobin: 11 g/dL — ABNORMAL LOW (ref 12.0–15.0)
MCH: 28.9 pg (ref 26.0–34.0)
MCHC: 31.3 g/dL (ref 30.0–36.0)
MCV: 92.4 fL (ref 80.0–100.0)
Platelets: 163 10*3/uL (ref 150–400)
RBC: 3.81 MIL/uL — ABNORMAL LOW (ref 3.87–5.11)
RDW: 14.6 % (ref 11.5–15.5)
WBC: 9.4 10*3/uL (ref 4.0–10.5)
nRBC: 0 % (ref 0.0–0.2)

## 2022-07-03 LAB — BASIC METABOLIC PANEL
Anion gap: 7 (ref 5–15)
BUN: 27 mg/dL — ABNORMAL HIGH (ref 8–23)
CO2: 25 mmol/L (ref 22–32)
Calcium: 8.8 mg/dL — ABNORMAL LOW (ref 8.9–10.3)
Chloride: 104 mmol/L (ref 98–111)
Creatinine, Ser: 1.92 mg/dL — ABNORMAL HIGH (ref 0.44–1.00)
GFR, Estimated: 26 mL/min — ABNORMAL LOW (ref >60)
Glucose, Bld: 104 mg/dL — ABNORMAL HIGH (ref 70–99)
Potassium: 4.2 mmol/L (ref 3.5–5.1)
Sodium: 136 mmol/L (ref 135–145)

## 2022-07-03 LAB — TROPONIN I (HIGH SENSITIVITY): Troponin I (High Sensitivity): 5 ng/L (ref <18)

## 2022-07-03 LAB — D-DIMER, QUANTITATIVE: D-Dimer, Quant: 1.58 ug/mL-FEU — ABNORMAL HIGH (ref 0.00–0.50)

## 2022-07-03 LAB — MAGNESIUM: Magnesium: 2.1 mg/dL (ref 1.7–2.4)

## 2022-07-03 MED ORDER — ACETAMINOPHEN 500 MG PO TABS
1000.0000 mg | ORAL_TABLET | Freq: Once | ORAL | Status: AC
  Administered 2022-07-03: 1000 mg via ORAL
  Filled 2022-07-03: qty 2

## 2022-07-03 MED ORDER — HEPARIN (PORCINE) 25000 UT/250ML-% IV SOLN
1100.0000 [IU]/h | INTRAVENOUS | Status: DC
  Administered 2022-07-03: 1100 [IU]/h via INTRAVENOUS
  Filled 2022-07-03: qty 250

## 2022-07-03 MED ORDER — HEPARIN BOLUS VIA INFUSION
4500.0000 [IU] | Freq: Once | INTRAVENOUS | Status: AC
  Administered 2022-07-03: 4500 [IU] via INTRAVENOUS

## 2022-07-03 NOTE — ED Notes (Signed)
Assisted patient to the bathroom, she walked by herself with her Heparin gtt. No incidents, pt returned to be with callbell nearby.

## 2022-07-03 NOTE — ED Notes (Signed)
ED Provider at bedside. 

## 2022-07-03 NOTE — ED Notes (Signed)
US at bedside

## 2022-07-03 NOTE — ED Triage Notes (Signed)
Pt arrives pov, slow gait, reports reports fatigue and mid-sternal  CP  radiating to LT arm x 3 days, referral from UC. Endorses pain with deep inspiration. Reports EKG and XR at UC. Denies n/v

## 2022-07-03 NOTE — Progress Notes (Signed)
ANTICOAGULATION CONSULT NOTE - Follow Up Consult  Pharmacy Consult for Heparin infusion Indication: suspected pulmonary embolus  Allergies  Allergen Reactions   Azithromycin Rash    Pruritic rash diffuse  Other reaction(s): RASH   Celecoxib Nausea Only    Patient Measurements: Height: 5' 4.75" (164.5 cm) Weight: 63.5 kg (140 lb) IBW/kg (Calculated) : 56.43 Heparin Dosing Weight: 63.5  Vital Signs: Temp: 98 F (36.7 C) (12/03 1254) Temp Source: Oral (12/03 1254) BP: 119/66 (12/03 1600) Pulse Rate: 76 (12/03 1615)  Labs: Recent Labs    07/03/22 1334  HGB 11.0*  HCT 35.2*  PLT 163  CREATININE 1.92*  TROPONINIHS 5    Estimated Creatinine Clearance: 20.8 mL/min (A) (by C-G formula based on SCr of 1.92 mg/dL (H)).   Medications:  (Not in a hospital admission)   Assessment: 81 yo F presented to [facility] with c/o pleuritic chest pain, no radiographic findings for PNA. D-dimer 1.58, more concerned for suspected PE. Unable to get CTA at med center. Plan for VQ scan and transfer to hospital. Pt was not taking any anticoagulation prior to admission. Pharmacy consulted to dose heparin for suspected PE.  Hgb 11, Plt 164, D-dimer 1.58  Goal of Therapy:  Heparin level 0.3-0.7 units/ml Monitor platelets by anticoagulation protocol: Yes   Plan:  Give 4500 units IV heparin bolus, then Start heparin infusion at 1100 units/hr Check heparin level in 8 hours Monitor daily CBC, heparin level, and for s/sx of bleeding  Luisa Hart, PharmD, BCPS Clinical Pharmacist 07/03/2022 6:23 PM   Please refer to [facility] for pharmacy phone number

## 2022-07-03 NOTE — ED Notes (Signed)
Husband going to go home, when bed status changes, please contact Husband Rich, and son [name] about bed assignment and possible ETA.

## 2022-07-03 NOTE — ED Provider Notes (Signed)
I received the patient in signout from Dr. Mayra Neer, briefly the patient is an 81 year old female with a chief complaints of pleuritic chest pain.  Had a CT scan to evaluate for occult pneumonia.  Has an AKI.  Chest CTA with concern for some lymphadenopathy.  Bronchiectasis.  Patient is higher clinical concern for pulmonary embolism based on her history.  Unfortunately unable to CT angiogram here.  Plan for D-dimer.  D-dimer was positive.  Bilateral lower extremity duplex is negative.  Unable to rule out PE at this time at this facility.  Discussed with hospitalist about VQ scan in the hospital.     Deno Etienne, DO 07/03/22 1759

## 2022-07-04 ENCOUNTER — Encounter: Payer: Self-pay | Admitting: Family Medicine

## 2022-07-04 ENCOUNTER — Encounter (HOSPITAL_COMMUNITY): Payer: Self-pay | Admitting: Internal Medicine

## 2022-07-04 DIAGNOSIS — N1832 Chronic kidney disease, stage 3b: Secondary | ICD-10-CM

## 2022-07-04 DIAGNOSIS — F3342 Major depressive disorder, recurrent, in full remission: Secondary | ICD-10-CM

## 2022-07-04 DIAGNOSIS — R079 Chest pain, unspecified: Secondary | ICD-10-CM

## 2022-07-04 DIAGNOSIS — J90 Pleural effusion, not elsewhere classified: Secondary | ICD-10-CM | POA: Diagnosis not present

## 2022-07-04 DIAGNOSIS — J9859 Other diseases of mediastinum, not elsewhere classified: Secondary | ICD-10-CM

## 2022-07-04 DIAGNOSIS — I700 Atherosclerosis of aorta: Secondary | ICD-10-CM | POA: Diagnosis not present

## 2022-07-04 DIAGNOSIS — Z79899 Other long term (current) drug therapy: Secondary | ICD-10-CM | POA: Diagnosis not present

## 2022-07-04 DIAGNOSIS — F32A Depression, unspecified: Secondary | ICD-10-CM | POA: Diagnosis not present

## 2022-07-04 DIAGNOSIS — Z85118 Personal history of other malignant neoplasm of bronchus and lung: Secondary | ICD-10-CM

## 2022-07-04 DIAGNOSIS — E785 Hyperlipidemia, unspecified: Secondary | ICD-10-CM

## 2022-07-04 DIAGNOSIS — I129 Hypertensive chronic kidney disease with stage 1 through stage 4 chronic kidney disease, or unspecified chronic kidney disease: Secondary | ICD-10-CM | POA: Diagnosis not present

## 2022-07-04 DIAGNOSIS — I10 Essential (primary) hypertension: Secondary | ICD-10-CM

## 2022-07-04 DIAGNOSIS — R072 Precordial pain: Secondary | ICD-10-CM | POA: Diagnosis not present

## 2022-07-04 DIAGNOSIS — C37 Malignant neoplasm of thymus: Secondary | ICD-10-CM

## 2022-07-04 DIAGNOSIS — A310 Pulmonary mycobacterial infection: Secondary | ICD-10-CM

## 2022-07-04 DIAGNOSIS — M797 Fibromyalgia: Secondary | ICD-10-CM | POA: Diagnosis not present

## 2022-07-04 DIAGNOSIS — Z8673 Personal history of transient ischemic attack (TIA), and cerebral infarction without residual deficits: Secondary | ICD-10-CM | POA: Diagnosis not present

## 2022-07-04 LAB — BASIC METABOLIC PANEL
Anion gap: 10 (ref 5–15)
BUN: 19 mg/dL (ref 8–23)
CO2: 24 mmol/L (ref 22–32)
Calcium: 8.9 mg/dL (ref 8.9–10.3)
Chloride: 105 mmol/L (ref 98–111)
Creatinine, Ser: 1.59 mg/dL — ABNORMAL HIGH (ref 0.44–1.00)
GFR, Estimated: 33 mL/min — ABNORMAL LOW (ref >60)
Glucose, Bld: 89 mg/dL (ref 70–99)
Potassium: 4.2 mmol/L (ref 3.5–5.1)
Sodium: 139 mmol/L (ref 135–145)

## 2022-07-04 LAB — HEPARIN LEVEL (UNFRACTIONATED)
Heparin Unfractionated: 1.03 IU/mL — ABNORMAL HIGH (ref 0.30–0.70)
Heparin Unfractionated: 0.57 IU/mL (ref 0.30–0.70)

## 2022-07-04 LAB — CBC
HCT: 34.6 % — ABNORMAL LOW (ref 36.0–46.0)
Hemoglobin: 11.1 g/dL — ABNORMAL LOW (ref 12.0–15.0)
MCH: 29.4 pg (ref 26.0–34.0)
MCHC: 32.1 g/dL (ref 30.0–36.0)
MCV: 91.8 fL (ref 80.0–100.0)
Platelets: 169 10*3/uL (ref 150–400)
RBC: 3.77 MIL/uL — ABNORMAL LOW (ref 3.87–5.11)
RDW: 14.3 % (ref 11.5–15.5)
WBC: 6.6 10*3/uL (ref 4.0–10.5)
nRBC: 0 % (ref 0.0–0.2)

## 2022-07-04 LAB — TROPONIN I (HIGH SENSITIVITY): Troponin I (High Sensitivity): 3 ng/L (ref <18)

## 2022-07-04 MED ORDER — LOSARTAN POTASSIUM 25 MG PO TABS
12.5000 mg | ORAL_TABLET | Freq: Every day | ORAL | Status: DC
  Administered 2022-07-05: 12.5 mg via ORAL
  Filled 2022-07-04: qty 1

## 2022-07-04 MED ORDER — ACETAMINOPHEN 650 MG RE SUPP: 650.0000 mg | Freq: Four times a day (QID) | RECTAL | Status: DC | PRN

## 2022-07-04 MED ORDER — ZOLPIDEM TARTRATE 5 MG PO TABS
5.0000 mg | ORAL_TABLET | Freq: Every evening | ORAL | Status: DC | PRN
  Administered 2022-07-04: 5 mg via ORAL
  Filled 2022-07-04: qty 1

## 2022-07-04 MED ORDER — ATORVASTATIN CALCIUM 40 MG PO TABS
40.0000 mg | ORAL_TABLET | Freq: Every day | ORAL | Status: DC
  Administered 2022-07-05: 40 mg via ORAL
  Filled 2022-07-04: qty 1

## 2022-07-04 MED ORDER — HEPARIN (PORCINE) 25000 UT/250ML-% IV SOLN
950.0000 [IU]/h | INTRAVENOUS | Status: DC
  Administered 2022-07-04: 950 [IU]/h via INTRAVENOUS
  Filled 2022-07-04: qty 250

## 2022-07-04 MED ORDER — GABAPENTIN 300 MG PO CAPS
300.0000 mg | ORAL_CAPSULE | Freq: Two times a day (BID) | ORAL | Status: DC | PRN
  Filled 2022-07-04: qty 1

## 2022-07-04 MED ORDER — ONDANSETRON HCL 4 MG/2ML IJ SOLN: 4.0000 mg | Freq: Four times a day (QID) | INTRAMUSCULAR | Status: DC | PRN

## 2022-07-04 MED ORDER — OXYCODONE HCL 5 MG PO TABS: 5.0000 mg | ORAL_TABLET | ORAL | Status: DC | PRN

## 2022-07-04 MED ORDER — DULOXETINE HCL 30 MG PO CPEP
30.0000 mg | ORAL_CAPSULE | Freq: Two times a day (BID) | ORAL | Status: DC
  Administered 2022-07-04 – 2022-07-05 (×2): 30 mg via ORAL
  Filled 2022-07-04 (×2): qty 1

## 2022-07-04 MED ORDER — ACETAMINOPHEN 325 MG PO TABS: 650.0000 mg | ORAL_TABLET | Freq: Four times a day (QID) | ORAL | Status: DC | PRN

## 2022-07-04 MED ORDER — ONDANSETRON HCL 4 MG PO TABS: 4.0000 mg | ORAL_TABLET | Freq: Four times a day (QID) | ORAL | Status: DC | PRN

## 2022-07-04 NOTE — Hospital Course (Signed)
Mrs. Thayer is an 81 y.o. F with MAC, HTN, HLD, hx TIA, CKD IIIb baseline 1.6, hx PCKD, chronic orthostasis, fibromyalgia, depression who presented with chest pain.  Pain started acutely a few days ago, radiating to back and arm.  Continued pain was central, pleuritic, not exertional or positional.  Went to UC and was sent to the ER where CT chest showed a 4 cm anterior mediastinal mass.  D-dimer elevated and so she was started on heparin and transferred for further work up.

## 2022-07-04 NOTE — Assessment & Plan Note (Signed)
-   Continue duloxetine, Ambien

## 2022-07-04 NOTE — Assessment & Plan Note (Signed)
Continue atorvastatin

## 2022-07-04 NOTE — Assessment & Plan Note (Signed)
Continue losartan. 

## 2022-07-04 NOTE — Assessment & Plan Note (Signed)
Probably the cause of chest pain. - Consult CT surgery, appreciate recommendations

## 2022-07-04 NOTE — Progress Notes (Addendum)
ANTICOAGULATION CONSULT NOTE - Follow Up Consult  Pharmacy Consult for Heparin infusion Indication: suspected pulmonary embolus  Allergies  Allergen Reactions   Azithromycin Rash    Pruritic rash diffuse  Other reaction(s): RASH   Celecoxib Nausea Only    Patient Measurements: Height: 5' 4.75" (164.5 cm) Weight: 63.5 kg (140 lb) IBW/kg (Calculated) : 56.43 Heparin Dosing Weight: 63.5  Vital Signs: Temp: 98 F (36.7 C) (12/04 1715) Temp Source: Oral (12/04 1715) BP: 127/58 (12/04 1600) Pulse Rate: 84 (12/04 1715)  Labs: Recent Labs    07/03/22 1334 07/04/22 0643 07/04/22 1802  HGB 11.0* 11.1*  --   HCT 35.2* 34.6*  --   PLT 163 169  --   HEPARINUNFRC  --  1.03* 0.57  CREATININE 1.92*  --   --   TROPONINIHS 5  --   --      Estimated Creatinine Clearance: 20.8 mL/min (A) (by C-G formula based on SCr of 1.92 mg/dL (H)).   Medications:  Medications Prior to Admission  Medication Sig Dispense Refill Last Dose   aspirin 81 MG tablet Take 1 tablet (81 mg total) by mouth daily. 30 tablet     atorvastatin (LIPITOR) 40 MG tablet Take 1 tablet (40 mg total) by mouth daily. 90 tablet 3    calcium-vitamin D 250-100 MG-UNIT tablet Take 1 tablet by mouth 2 (two) times daily.      DULoxetine (CYMBALTA) 30 MG capsule TAKE ONE CAPSULE BY MOUTH TWICE DAILY 180 capsule 3    famotidine (PEPCID) 20 MG tablet TAKE 1 TABLET(20 MG) BY MOUTH TWICE DAILY 180 tablet 1    gabapentin (NEURONTIN) 300 MG capsule TAKE 1 CAPSULE(300 MG) BY MOUTH TWICE DAILY AS NEEDED FOR PAIN 180 capsule 3    losartan (COZAAR) 25 MG tablet Take 12.5 mg by mouth daily.      Multiple Vitamins-Minerals (MULTIVITAMIN WITH MINERALS) tablet Take 1 tablet by mouth daily.      Polyethyl Glycol-Propyl Glycol (SYSTANE) 0.4-0.3 % GEL ophthalmic gel Place 1 application into both eyes 3 (three) times daily as needed (dry eye).      zolpidem (AMBIEN) 5 MG tablet TAKE 1 TABLET BY MOUTH EVERY DAY AT BEDTIME FOR SLEEP 90 tablet  1     Assessment: 81 yo F presented to [facility] with c/o pleuritic chest pain, no radiographic findings for PNA. D-dimer 1.58, more concerned for suspected PE. Plan for VQ scan and transfer to hospital. Pt was not taking any anticoagulation prior to admission.  -heparin level= 0.57 after 30 minute hold and decrease to 950 units/hr   Goal of Therapy:  Heparin level 0.3-0.7 units/ml Monitor platelets by anticoagulation protocol: Yes   Plan:  -Continue heparin at 950 units/hr -Heparin level and CBC in am  Hildred Laser, PharmD Clinical Pharmacist **Pharmacist phone directory can now be found on [email or web address] ([email or web address]).  Listed under [facility].

## 2022-07-04 NOTE — ED Notes (Signed)
Holding heparin for 30 mins per pharm as heparin level is high

## 2022-07-04 NOTE — ED Notes (Signed)
Pt given wash cloth and coffee with cream, watching TV  await ready bed

## 2022-07-04 NOTE — Assessment & Plan Note (Signed)
Completed therapy with AZ/ETH/RIF.

## 2022-07-04 NOTE — Assessment & Plan Note (Signed)
Suspect this is from mediastinal mass.  ECG normal.  Initial troponin negative.  No exertional component.  EDP started heparin gtt empirically for elevated d-dimer. - Repeat troponin - Obtain Echo - Continue heparin gtt - Obtain VQ - Stop heparin if VQ negative

## 2022-07-04 NOTE — ED Notes (Signed)
PT left with [facility] at this time.

## 2022-07-04 NOTE — Assessment & Plan Note (Signed)
Due to PCKD.  Follows with Dr. Hollie Salk. Cr stable relative to baseline.  AKI ruled out. - Trend Cr

## 2022-07-04 NOTE — H&P (Signed)
History and Physical    Patient: Sandra Craig:[id number] DOB: 09/24/1940 DOA: 07/03/2022 DOS: the patient was seen and examined on 07/04/2022 PCP: [name], MD  Patient coming from: Home  Chief Complaint:  Chief Complaint  Patient presents with   Chest Pain       HPI:  Sandra Craig is an 81 y.o. F with MAC, HTN, HLD, hx TIA, CKD IIIb baseline 1.6, hx PCKD, chronic orthostasis, fibromyalgia, depression who presented with chest pain.  Pain started acutely a few days ago, radiating to back and arm.  Continued pain was central, pleuritic, not exertional or positional.  Went to UC and was sent to the ER where CT chest showed a 4 cm anterior mediastinal mass.  D-dimer elevated and so she was started on heparin and transferred for further work up.      Review of Systems  Constitutional:  Negative for chills, diaphoresis, fever, malaise/fatigue and weight loss.  Respiratory:  Negative for cough, hemoptysis, sputum production, shortness of breath and wheezing.   Cardiovascular:  Positive for chest pain and leg swelling. Negative for palpitations.  Musculoskeletal:  Positive for back pain.  All other systems reviewed and are negative.    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   Chronic insomnia    Cystitis    Depression    Diverticulitis of intestine without perforation or abscess without bleeding    Patient did have abscess but noperforation   Diverticulosis of colon (without mention of hemorrhage)    Endometriosis    Family history of malignant neoplasm of gastrointestinal tract    Fibromyalgia    Gastritis    Hiatal hernia    HIATAL HERNIA 10/08/2008   Qualifier: Diagnosis of  By: Nils Pyle CMA (AAMA), Mearl Latin     History of gallstones    HSV-1 infection    Hyperlipidemia    Hypertension    IBS (irritable bowel syndrome)    IC (interstitial cystitis)    Internal hemorrhoid     Osteonecrosis ([place])    Osteopenia    Osteoporosis    Palpitations    Polycystic kidney disease    Pulmonary nodule 07/2007   5 mm Anterior RUL   Small bowel obstruction ([place])    Stroke ([place])    TIA   Past Surgical History:  Procedure Laterality Date   ABDOMINAL HYSTERECTOMY  1994   TAH,BSO FOR ENDOMETRIOSIS   ABDOMINAL SURGERY  2011   small intestine blockage   CHOLECYSTECTOMY     GASTROPLASTY  2011   small bowel resection -open   INGUINAL HERNIA REPAIR Right 02/28/2020   Procedure: LAPAROSCOPIC RIGHT INGUINAL HERNIA REPAIR WITH MESH;  Surgeon: Ralene Ok, MD;  Location: [place];  Service: General;  Laterality: Right;   JOINT REPLACEMENT  2008   OOPHORECTOMY  1994   TAH,BSO   PELVIC LAPAROSCOPY     S/P right shoulder rotater cuff  200216/2011   Tear/adhesive capsulitis   SBO Lap  11   Adhesions and small internal hernia   SHOULDER HEMI-ARTHROPLASTY  06/05/2012   Procedure: SHOULDER HEMI-ARTHROPLASTY;  Surgeon: Johnny Bridge, MD;  Location: [place];  Service: Orthopedics;  Laterality: Right;  FOR ARTHRITIS   TOTAL HIP ARTHROPLASTY  FALL OF 2008   rt. partial hip replacement   TOTAL SHOULDER ARTHROPLASTY  06/05/2012   Procedure: TOTAL SHOULDER ARTHROPLASTY;  Surgeon: Johnny Bridge, MD;  Location: [place];  Service: Orthopedics;  Laterality: Right;  RIGHT SHOULDER TOTAL ARTHROPLASTY, HEMIARTHROPLASTY, SHOULDER, FOR ARTHRITIS   VIDEO BRONCHOSCOPY Bilateral 01/28/2015   Procedure: VIDEO BRONCHOSCOPY WITH FLUORO;  Surgeon: Collene Gobble, MD;  Location: [place];  Service: Cardiopulmonary;  Laterality: Bilateral;   VIDEO BRONCHOSCOPY Bilateral 04/23/2019   Procedure: VIDEO BRONCHOSCOPY WITHOUT FLUORO;  Surgeon: Collene Gobble, MD;  Location: [facility] ENDOSCOPY;  Service: Cardiopulmonary;  Laterality: Bilateral;   Social History: Retired Secretary/administrator.  Lived in [place] and also down at [employer].  Divorced and remarried.  Smoked 17 years, quit 40 years ago.  Allergies   Allergen Reactions   Azithromycin Rash    Pruritic rash diffuse  Other reaction(s): RASH   Celecoxib Nausea Only    Family History  Problem Relation Age of Onset   Heart disease Mother        MI at age 69   Emphysema Mother    Thyroid disease Mother        Thyroidectomy/Benign   COPD Father    Pancreatic cancer Sister        died 14   Cancer Brother        adenocarcinoma right lung   COPD Brother    Lymphoma Maternal Grandmother    Colon cancer Paternal Grandmother    Heart attack Paternal Grandfather     Prior to Admission medications   Medication Sig Start Date End Date Taking? Authorizing Provider  aspirin 81 MG tablet Take 1 tablet (81 mg total) by mouth daily. 01/23/18   Frann Rider, NP  atorvastatin (LIPITOR) 40 MG tablet Take 1 tablet (40 mg total) by mouth daily. 06/22/22   [name], MD  calcium-vitamin D 250-100 MG-UNIT tablet Take 1 tablet by mouth 2 (two) times daily.    [provider]  DULoxetine (CYMBALTA) 30 MG capsule TAKE ONE CAPSULE BY MOUTH TWICE DAILY 07/28/21   [name], MD  famotidine (PEPCID) 20 MG tablet TAKE 1 TABLET(20 MG) BY MOUTH TWICE DAILY 02/14/22   [name], MD  gabapentin (NEURONTIN) 300 MG capsule TAKE 1 CAPSULE(300 MG) BY MOUTH TWICE DAILY AS NEEDED FOR PAIN 05/18/22   [name], MD  losartan (COZAAR) 25 MG tablet Take 12.5 mg by mouth daily. 02/10/22   [provider]  Multiple Vitamins-Minerals (MULTIVITAMIN WITH MINERALS) tablet Take 1 tablet by mouth daily.    [provider]  Polyethyl Glycol-Propyl Glycol (SYSTANE) 0.4-0.3 % GEL ophthalmic gel Place 1 application into both eyes 3 (three) times daily as needed (dry eye).    [provider]  zolpidem (AMBIEN) 5 MG tablet TAKE 1 TABLET BY MOUTH EVERY DAY AT BEDTIME FOR SLEEP 04/15/22   [name], MD    Physical Exam: Vitals:   07/04/22 1530 07/04/22 1600 07/04/22 1620 07/04/22 1715  BP: (!) 111/55 (!) 127/58     Pulse: 80 85  84  Resp: _0 Temp:   97.8 F (36.6 C) 98 F (36.7 C)  TempSrc:    Oral  SpO2: 98% 97%  98%  Weight:      Height:       Adult female, no acute distress, sitting up straight, interactive and appropriate Anicteric, conjunctival pink, lids and lashes normal.  No nasal forming, discharge, or epistaxis. Oropharynx moist, no oral lesions, dentition in good repair, lips normal No pain to palpation of the precordium, heart rate normal, S1-S2 normal, no murmurs, no peripheral edema, no JVD Respiratory rate normal, lungs clear  without rales or wheezes Abdomen soft without tenderness palpation Attention normal, affect appropriate, judgment and insight appear normal Face symmetric, speech fluent, moves all 4 extremities with normal strength and coordination, gait normal.      Data Reviewed: Discussed with CT surgery Basic metabolic panel notable for creatinine 1.9, close to baseline 1.6-1.7 CBC shows minimal anemia, normal white blood cells and thrombocytes Troponin normal Magnesium normal D-dimer 1.58 CT chest shows known bronchiectasis, 4 cm anterior mediastinal mass, suspicious for lymph node Ultrasound of bilateral lower extremities normal EKG, personally reviewed, shows no ST changes, it is normal sinus rhythm      Assessment and Plan: * Chest pain Suspect this is from mediastinal mass.  ECG normal.  Initial troponin negative.  No exertional component.  EDP started heparin gtt empirically for elevated d-dimer. - Repeat troponin - Obtain Echo - Continue heparin gtt - Obtain VQ - Stop heparin if VQ negative    Mediastinal mass Probably the cause of chest pain. - Consult CT surgery, appreciate recommendations  MAI (mycobacterium avium-intracellulare) infection ([place]) Completed therapy with AZ/ETH/RIF.    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  Follows with Dr. Hollie Salk. Cr stable relative to baseline.  AKI ruled out. - Trend  Cr  Hyperlipidemia - Continue atorvastatin  Hypertension - Continue losartan  Fibromyalgia - Continue duloxetine, gabapentin  Depression - Continue duloxetine, Ambien         Advance Care Planning: FULL CODE presumed  Consults: CT surgery, Dr. Roxan Hockey  Family Communication: None present  Severity of Illness: The appropriate patient status for this patient is OBSERVATION. Observation status is judged to be reasonable and necessary in order to provide the required intensity of service to ensure the patient's safety. The patient's presenting symptoms, physical exam findings, and initial radiographic and laboratory data in the context of their medical condition is felt to place them at decreased risk for further clinical deterioration. Furthermore, it is anticipated that the patient will be medically stable for discharge from the hospital within 2 midnights of admission.   Author: Edwin Dada, MD 07/04/2022 6:12 PM  For on call review [email or web address].

## 2022-07-04 NOTE — Assessment & Plan Note (Signed)
-   Continue duloxetine, gabapentin

## 2022-07-04 NOTE — Progress Notes (Signed)
ANTICOAGULATION CONSULT NOTE - Follow Up Consult  Pharmacy Consult for Heparin infusion Indication: suspected pulmonary embolus  Allergies  Allergen Reactions   Azithromycin Rash    Pruritic rash diffuse  Other reaction(s): RASH   Celecoxib Nausea Only    Patient Measurements: Height: 5' 4.75" (164.5 cm) Weight: 63.5 kg (140 lb) IBW/kg (Calculated) : 56.43 Heparin Dosing Weight: 63.5  Vital Signs: Temp: 98 F (36.7 C) (12/04 0500) Temp Source: Oral (12/04 0500) BP: 120/68 (12/04 0530) Pulse Rate: 76 (12/04 0530)  Labs: Recent Labs    07/03/22 1334 07/04/22 0643  HGB 11.0* 11.1*  HCT 35.2* 34.6*  PLT 163 169  HEPARINUNFRC  --  1.03*  CREATININE 1.92*  --   TROPONINIHS 5  --      Estimated Creatinine Clearance: 20.8 mL/min (A) (by C-G formula based on SCr of 1.92 mg/dL (H)).   Medications:  (Not in a hospital admission)  Assessment: 81 yo F presented to [facility] with c/o pleuritic chest pain, no radiographic findings for PNA. D-dimer 1.58, more concerned for suspected PE. Plan for VQ scan and transfer to hospital. Pt was not taking any anticoagulation prior to admission. Initial heparin level is elevated at 1.03. No bleeding noted. CBC is stable.   Goal of Therapy:  Heparin level 0.3-0.7 units/ml Monitor platelets by anticoagulation protocol: Yes   Plan:  Hold heparin gtt x 30 minutes Resume at 950 units/hr Check an 8 hr heparin level Daily heparin level and CBC  Salome Arnt, PharmD, BCPS, BCEMP Clinical Pharmacist Please see [facility] for all pharmacy numbers 07/04/2022 9:21 AM

## 2022-07-05 ENCOUNTER — Observation Stay (HOSPITAL_BASED_OUTPATIENT_CLINIC_OR_DEPARTMENT_OTHER): Payer: Medicare Other

## 2022-07-05 ENCOUNTER — Observation Stay (HOSPITAL_COMMUNITY): Payer: Medicare Other

## 2022-07-05 ENCOUNTER — Other Ambulatory Visit: Payer: Self-pay | Admitting: Thoracic Surgery (Cardiothoracic Vascular Surgery)

## 2022-07-05 ENCOUNTER — Other Ambulatory Visit (HOSPITAL_COMMUNITY): Payer: Self-pay

## 2022-07-05 DIAGNOSIS — J9859 Other diseases of mediastinum, not elsewhere classified: Secondary | ICD-10-CM

## 2022-07-05 DIAGNOSIS — R079 Chest pain, unspecified: Secondary | ICD-10-CM | POA: Diagnosis not present

## 2022-07-05 DIAGNOSIS — I2699 Other pulmonary embolism without acute cor pulmonale: Secondary | ICD-10-CM | POA: Diagnosis not present

## 2022-07-05 LAB — ECHOCARDIOGRAM COMPLETE
AR max vel: 2.28 cm2
AV Area VTI: 2.04 cm2
AV Area mean vel: 2.08 cm2
AV Mean grad: 2 mmHg
AV Peak grad: 3.4 mmHg
Ao pk vel: 0.93 m/s
Area-P 1/2: 3.4 cm2
Height: 64.75 in
S' Lateral: 2.4 cm
Weight: 2240 oz

## 2022-07-05 LAB — HEPARIN LEVEL (UNFRACTIONATED): Heparin Unfractionated: 0.39 IU/mL (ref 0.30–0.70)

## 2022-07-05 MED ORDER — TECHNETIUM TO 99M ALBUMIN AGGREGATED
4.1000 | Freq: Once | INTRAVENOUS | Status: AC | PRN
  Administered 2022-07-05: 4.1 via INTRAVENOUS

## 2022-07-05 NOTE — Progress Notes (Signed)
Pt discharged home with complications. Pt spoke with provider via phone.

## 2022-07-05 NOTE — Discharge Summary (Signed)
Physician Discharge Summary   Patient: Sandra Craig MRN: [id number] DOB: 1940-08-27  Admit date:     07/03/2022  Discharge date: 07/05/22  Discharge Physician: Edwin Dada   PCP: [name], MD     Recommendations at discharge:  Follow up with Dr. 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 is an 81 y.o. F with MAC, HTN, HLD, hx TIA, CKD IIIb baseline 1.6, hx PCKD, chronic orthostasis, fibromyalgia, depression who presented with acute chest pain, pleuritic in nature, for last 2-3 days, radiating to back and arm, not exertional or associated with dyspnea.  In the ER, CT showed a new mediastinal mass.  Started on heparin empirically and transferred for VQ scan.      * Chest pain due to mass ECG normal, troponins normal, myocardial infarction ruled out.  Echo normal.   VQ scan ruled out PE, heparin stopped.    All indications are that pain is from her mediastinal mass.     CT surgery were consutled, met the patient and recommended outpatient follow up.  They will arrange PET scan and CT surg clinic appt.            The [employer] Controlled Substances Registry was reviewed for this patient prior to discharge.  Consultants:  CT surgery Procedures performed: VQ scan, echo  Disposition: Home   DISCHARGE MEDICATION: Allergies as of 07/05/2022       Reactions   Azithromycin Rash   Pruritic rash diffuse  Other reaction(s): RASH   Celecoxib Nausea Only        Medication List     TAKE these medications    amLODipine 2.5 MG tablet Commonly known as: NORVASC Take 2.5 mg by mouth daily.   aspirin 81 MG tablet Take 1 tablet (81 mg total) by  mouth daily.   atorvastatin 40 MG tablet Commonly known as: LIPITOR Take 1 tablet (40 mg total) by mouth daily.   calcium-vitamin D 250-100 MG-UNIT tablet Take 1 tablet by mouth 2 (two) times daily.   DULoxetine 30 MG capsule Commonly known as: CYMBALTA TAKE ONE CAPSULE BY MOUTH TWICE DAILY   famotidine 20 MG tablet Commonly known as: PEPCID TAKE 1 TABLET(20 MG) BY MOUTH TWICE DAILY   gabapentin 300 MG capsule Commonly known as: NEURONTIN TAKE 1 CAPSULE(300 MG) BY MOUTH TWICE DAILY AS NEEDED FOR PAIN   losartan 25 MG tablet Commonly known as: COZAAR Take 12.5 mg by mouth daily.   multivitamin with minerals tablet Take 1 tablet by mouth daily.   Polyethyl Glycol-Propyl Glycol 0.4-0.3 % Gel ophthalmic gel Commonly known as: SYSTANE Place 1 application into both eyes 3 (three) times daily as needed (dry eye).   tiZANidine 4 MG tablet Commonly known as: ZANAFLEX Take 2 mg by mouth 3 (three) times daily as needed.   zolpidem 5 MG tablet Commonly known as: AMBIEN TAKE 1 TABLET BY MOUTH EVERY DAY AT BEDTIME FOR SLEEP        Follow-up Information     [name], MD Follow up.   Specialty: Cardiothoracic Surgery Contact information: [address] [place] [address] [phone]  Discharge Instructions     Discharge instructions   Complete by: As directed    **IMPORTANT DISCHARGE INSTRUCTIONS**   From Dr. Loleta Books: You were evaluated for chest pain.  Here, we did imaging that ruled out a blood clot in the chest.    We also did testing of your heart that ruled out a heart attack.  Likely your pain is from the mediastinal mass (that means the mass, about 4cm in size, that is in the middle structures of your chest, adjacent to the heart)  Follow up with Dr. Roxan Hockey for this, as we discussed   Increase activity slowly   Complete by: As directed        Discharge Exam: Filed Weights   07/03/22 1254   Weight: 63.5 kg    General: Pt is alert, awake, not in acute distress Cardiovascular: RRR, nl S1-S2, no murmurs appreciated.   No LE edema.   Respiratory: Normal respiratory rate and rhythm.  CTAB without rales or wheezes. Abdominal: Abdomen soft and non-tender.  No distension or HSM.   Neuro/Psych: Strength symmetric in upper and lower extremities.  Judgment and insight appear normal.   Condition at discharge: good  The results of significant diagnostics from this hospitalization (including imaging, microbiology, ancillary and laboratory) are listed below for reference.   Imaging Studies: NM Pulmonary Perfusion  Result Date: 07/05/2022 CLINICAL DATA:  Chest pain question pulmonary embolism EXAM: NUCLEAR MEDICINE PERFUSION LUNG SCAN TECHNIQUE: Perfusion images were obtained in multiple projections after intravenous injection of radiopharmaceutical. Ventilation scans intentionally deferred if perfusion scan and chest x-ray adequate for interpretation during COVID 19 epidemic. RADIOPHARMACEUTICALS:  4.1 mCi Tc-52mMAA IV COMPARISON:  Chest radiograph 07/05/2022 FINDINGS: No segmental or subsegmental perfusion defects. Minimal peripheral irregularity likely reflecting parenchymal lung disease. IMPRESSION: Pulmonary embolism absent. Electronically Signed   By: MLavonia DanaM.D.   On: 07/05/2022 12:29   DG Chest Port 1 View  Result Date: 07/05/2022 CLINICAL DATA:  Pulmonary embolism EXAM: PORTABLE CHEST 1 VIEW COMPARISON:  02/20/2020 FINDINGS: The heart size and mediastinal contours are within normal limits. Both lungs are clear. Status post right shoulder arthroplasty. IMPRESSION: No acute abnormality of the lungs in AP portable projection. Electronically Signed   By: ADelanna AhmadiM.D.   On: 07/05/2022 09:55   ECHOCARDIOGRAM COMPLETE  Result Date: 07/05/2022    ECHOCARDIOGRAM REPORT   Patient Name:   Sandra HARDWICKDate of Exam: 07/05/2022 Medical Rec #:  [id number]       Height:       64.8  in Accession #:    [id number]      Weight:       140.0 lb Date of Birth:  [birth year]       BSA:          1.695 m Patient Age:    81years         BP:           139/73 mmHg Patient Gender: F                HR:           74 bpm. Exam Location:  Inpatient Procedure: 2D Echo, Cardiac Doppler and Color Doppler Indications:    Chest pain  History:        Patient has prior history of Echocardiogram examinations, most                 recent 06/03/2017. Risk Factors:Hypertension, Dyslipidemia  and                 Former Smoker. CKD.  Sonographer:    Clayton Lefort RDCS (AE) Referring Phys: [id number] Danielle Mink P Melis Trochez IMPRESSIONS  1. Left ventricular ejection fraction, by estimation, is 60 to 65%. The left ventricle has normal function. The left ventricle has no regional wall motion abnormalities. Left ventricular diastolic parameters are consistent with Grade I diastolic dysfunction (impaired relaxation).  2. Right ventricular systolic function is normal. The right ventricular size is normal. Tricuspid regurgitation signal is inadequate for assessing PA pressure.  3. The mitral valve is grossly normal. Trivial mitral valve regurgitation. No evidence of mitral stenosis.  4. The aortic valve was not well visualized. Aortic valve regurgitation is not visualized. No aortic stenosis is present.  5. The inferior vena cava is normal in size with greater than 50% respiratory variability, suggesting right atrial pressure of 3 mmHg. FINDINGS  Left Ventricle: Left ventricular ejection fraction, by estimation, is 60 to 65%. The left ventricle has normal function. The left ventricle has no regional wall motion abnormalities. The left ventricular internal cavity size was normal in size. There is  no left ventricular hypertrophy. Left ventricular diastolic parameters are consistent with Grade I diastolic dysfunction (impaired relaxation). Right Ventricle: The right ventricular size is normal. No increase in right ventricular wall thickness.  Right ventricular systolic function is normal. Tricuspid regurgitation signal is inadequate for assessing PA pressure. Left Atrium: Left atrial size was normal in size. Right Atrium: Right atrial size was normal in size. Pericardium: There is no evidence of pericardial effusion. Mitral Valve: The mitral valve is grossly normal. Trivial mitral valve regurgitation. No evidence of mitral valve stenosis. Tricuspid Valve: The tricuspid valve is grossly normal. Tricuspid valve regurgitation is trivial. No evidence of tricuspid stenosis. Aortic Valve: The aortic valve was not well visualized. Aortic valve regurgitation is not visualized. No aortic stenosis is present. Aortic valve mean gradient measures 2.0 mmHg. Aortic valve peak gradient measures 3.4 mmHg. Aortic valve area, by VTI measures 2.04 cm. Pulmonic Valve: The pulmonic valve was not well visualized. Pulmonic valve regurgitation is not visualized. Aorta: The aortic root was not well visualized. Venous: The inferior vena cava is normal in size with greater than 50% respiratory variability, suggesting right atrial pressure of 3 mmHg. IAS/Shunts: The atrial septum is grossly normal.  LEFT VENTRICLE PLAX 2D LVIDd:         3.50 cm   Diastology LVIDs:         2.40 cm   LV e' medial:    6.74 cm/s LV PW:         0.80 cm   LV E/e' medial:  10.5 LV IVS:        0.70 cm   LV e' lateral:   10.20 cm/s LVOT diam:     1.90 cm   LV E/e' lateral: 7.0 LV SV:         42 LV SV Index:   25 LVOT Area:     2.84 cm  RIGHT VENTRICLE             IVC RV Basal diam:  3.00 cm     IVC diam: 1.80 cm RV S prime:     10.10 cm/s TAPSE (M-mode): 1.7 cm LEFT ATRIUM             Index        RIGHT ATRIUM  Index LA diam:        2.30 cm 1.36 cm/m   RA Area:     10.20 cm LA Vol (A2C):   39.9 ml 23.53 ml/m  RA Volume:   19.20 ml  11.32 ml/m LA Vol (A4C):   20.9 ml 12.33 ml/m LA Biplane Vol: 30.8 ml 18.17 ml/m  AORTIC VALVE AV Area (Vmax):    2.28 cm AV Area (Vmean):   2.08 cm AV Area  (VTI):     2.04 cm AV Vmax:           92.70 cm/s AV Vmean:          64.900 cm/s AV VTI:            0.204 m AV Peak Grad:      3.4 mmHg AV Mean Grad:      2.0 mmHg LVOT Vmax:         74.50 cm/s LVOT Vmean:        47.500 cm/s LVOT VTI:          0.147 m LVOT/AV VTI ratio: 0.72  AORTA Ao Root diam: 2.50 cm MITRAL VALVE MV Area (PHT): 3.40 cm     SHUNTS MV Decel Time: 223 msec     Systemic VTI:  0.15 m MV E velocity: 71.10 cm/s   Systemic Diam: 1.90 cm MV A velocity: 102.00 cm/s MV E/A ratio:  0.70 Eleonore Chiquito MD Electronically signed by Eleonore Chiquito MD Signature Date/Time: 07/05/2022/9:25:12 AM    Final    US Venous Img Lower Bilateral  Result Date: 07/03/2022 CLINICAL DATA:  Chest pain, shortness of breath, evaluate for DVT EXAM: BILATERAL LOWER EXTREMITY VENOUS DOPPLER ULTRASOUND TECHNIQUE: Gray-scale sonography with graded compression, as well as color Doppler and duplex ultrasound were performed to evaluate the lower extremity deep venous systems from the level of the common femoral vein and including the common femoral, femoral, profunda femoral, popliteal and calf veins including the posterior tibial, peroneal and gastrocnemius veins when visible. The superficial great saphenous vein was also interrogated. Spectral Doppler was utilized to evaluate flow at rest and with distal augmentation maneuvers in the common femoral, femoral and popliteal veins. COMPARISON:  None Available. FINDINGS: RIGHT LOWER EXTREMITY Common Femoral Vein: No evidence of thrombus. Normal compressibility, respiratory phasicity and response to augmentation. Saphenofemoral Junction: No evidence of thrombus. Normal compressibility and flow on color Doppler imaging. Profunda Femoral Vein: No evidence of thrombus. Normal compressibility and flow on color Doppler imaging. Femoral Vein: No evidence of thrombus. Normal compressibility, respiratory phasicity and response to augmentation. Popliteal Vein: No evidence of thrombus. Normal  compressibility, respiratory phasicity and response to augmentation. Calf Veins: No evidence of thrombus. Normal compressibility and flow on color Doppler imaging. Superficial Great Saphenous Vein: No evidence of thrombus. Normal compressibility. Venous Reflux:  None. Other Findings:  None. LEFT LOWER EXTREMITY Common Femoral Vein: No evidence of thrombus. Normal compressibility, respiratory phasicity and response to augmentation. Saphenofemoral Junction: No evidence of thrombus. Normal compressibility and flow on color Doppler imaging. Profunda Femoral Vein: No evidence of thrombus. Normal compressibility and flow on color Doppler imaging. Femoral Vein: No evidence of thrombus. Normal compressibility, respiratory phasicity and response to augmentation. Popliteal Vein: No evidence of thrombus. Normal compressibility, respiratory phasicity and response to augmentation. Calf Veins: No evidence of thrombus. Normal compressibility and flow on color Doppler imaging. Superficial Great Saphenous Vein: No evidence of thrombus. Normal compressibility. Venous Reflux:  None. Other Findings:  None. IMPRESSION: No evidence of deep venous  thrombosis in either lower extremity. Electronically Signed   By: Elmer Picker M.D.   On: 07/03/2022 17:54   CT CHEST WO CONTRAST  Result Date: 07/03/2022 CLINICAL DATA:  Fatigue and mid sternal chest pain EXAM: CT CHEST WITHOUT CONTRAST TECHNIQUE: Multidetector CT imaging of the chest was performed following the standard protocol without IV contrast. RADIATION DOSE REDUCTION: This exam was performed according to the departmental dose-optimization program which includes automated exposure control, adjustment of the mA and/or kV according to patient size and/or use of iterative reconstruction technique. COMPARISON:  Prior CT scan of the chest 03/19/2019 FINDINGS: Cardiovascular: Limited evaluation in the absence of intravenous contrast. No aneurysm. Scattered atherosclerotic  calcifications throughout the thoracic aorta. The heart is normal in size. No pericardial effusion. Mediastinum/Nodes: Significant interval enlargement of prevascular lymph node which now nearly fills the anterior mediastinum measuring approximately 4.5 x 4.0 cm compared to a maximum 1.6 cm previously. The remaining mediastinum appears unremarkable. Lungs/Pleura: Similar cluster of tree in bud nodularity and focal bronchiectasis in the right upper lobe. Bronchiectasis has progressed in there is more linear pleuroparenchymal scarring than prior. Additional foci of tree-in-bud micro nodularity are also present within the medial aspect of the lingula and throughout the periphery of left upper lobe. Findings are slightly progressed compared to prior. Mild dependent atelectasis. Trace bilateral pleural effusions. Upper Abdomen: No acute abnormality within the upper abdomen. Circumscribed water attenuation simple cyst exophytic from the anterior aspect of the left kidney is similar compared to prior imaging. The gallbladder is surgically absent. Musculoskeletal: No acute fracture or aggressive appearing lytic or blastic osseous lesion. IMPRESSION: 1. Significant interval enlargement of prevascular lymph node which now fills the anterior mediastinum and measures 4.5 x 4.0 cm compared to a maximum of 1.6 cm on prior imaging from August of 2020. Differential considerations include thymoma, lymphoma, teratoma, thyroid neoplasm and other metastatic disease. 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 2. 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 3. Aortic atherosclerotic vascular calcifications. 4. Trace bilateral pleural effusions and associated bibasilar atelectasis. Aortic Atherosclerosis (ICD10-I70.0). Electronically Signed   By: Jacqulynn Cadet M.D.   On: 07/03/2022  15:14    Microbiology: Results for orders placed or performed in visit on 11/02/21  Urine Culture     Status: None   Collection Time: 11/02/21 11:41 AM   Specimen: Urine  Result Value Ref Range Status   MICRO NUMBER: [id number]  Final   SPECIMEN QUALITY: Adequate  Final   Sample Source NOT GIVEN  Final   STATUS: FINAL  Final   ISOLATE 1:   Final    Mixed genital flora isolated. These superficial bacteria are not indicative of a urinary tract infection. No further organism identification is warranted on this specimen. If clinically indicated, recollect clean-catch, mid-stream urine and transfer  immediately to Urine Culture Transport Tube.     Labs: CBC: Recent Labs  Lab 07/03/22 1334 07/04/22 0643  WBC 9.4 6.6  HGB 11.0* 11.1*  HCT 35.2* 34.6*  MCV 92.4 91.8  PLT 163 073   Basic Metabolic Panel: Recent Labs  Lab 07/03/22 1334 07/04/22 1802  NA 136 139  K 4.2 4.2  CL 104 105  CO2 25 24  GLUCOSE 104* 89  BUN 27* 19  CREATININE 1.92* 1.59*  CALCIUM 8.8* 8.9  MG 2.1  --    Liver Function Tests: No results  for input(s): "AST", "ALT", "ALKPHOS", "BILITOT", "PROT", "ALBUMIN" in the last 168 hours. CBG: No results for input(s): "GLUCAP" in the last 168 hours.  Discharge time spent: approximately 25 minutes spent on discharge counseling, evaluation of patient on day of discharge, and coordination of discharge planning with nursing, social work, pharmacy and case management  Signed: Edwin Dada, MD [facility] 07/05/2022

## 2022-07-05 NOTE — Progress Notes (Signed)
Mobility Specialist - Progress Note   07/05/22 1057  Mobility  Activity Off unit   Pt off unit for procedure. Will follow up if time permits.   Franki Monte  Mobility Specialist Please contact via Solicitor or Rehab office at [phone]

## 2022-07-05 NOTE — Progress Notes (Signed)
  Echocardiogram 2D Echocardiogram has been performed.  Sandra Craig 07/05/2022, 9:19 AM

## 2022-07-05 NOTE — Progress Notes (Signed)
ANTICOAGULATION CONSULT NOTE - Follow Up Consult  Pharmacy Consult for Heparin infusion Indication: suspected pulmonary embolus  Allergies  Allergen Reactions   Azithromycin Rash    Pruritic rash diffuse  Other reaction(s): RASH   Celecoxib Nausea Only    Patient Measurements: Height: 5' 4.75" (164.5 cm) Weight: 63.5 kg (140 lb) IBW/kg (Calculated) : 56.43 Heparin Dosing Weight: 63.5  Vital Signs: Temp: 98 F (36.7 C) (12/05 0429) Temp Source: Oral (12/05 0429) BP: 133/75 (12/05 0429) Pulse Rate: 81 (12/05 0429)  Labs: Recent Labs    07/03/22 1334 07/04/22 0643 07/04/22 1802  HGB 11.0* 11.1*  --   HCT 35.2* 34.6*  --   PLT 163 169  --   HEPARINUNFRC  --  1.03* 0.57  CREATININE 1.92*  --  1.59*  TROPONINIHS 5  --  3     Estimated Creatinine Clearance: 25.1 mL/min (A) (by C-G formula based on SCr of 1.59 mg/dL (H)).   Medications:  Medications Prior to Admission  Medication Sig Dispense Refill Last Dose   aspirin 81 MG tablet Take 1 tablet (81 mg total) by mouth daily. 30 tablet     atorvastatin (LIPITOR) 40 MG tablet Take 1 tablet (40 mg total) by mouth daily. 90 tablet 3    calcium-vitamin D 250-100 MG-UNIT tablet Take 1 tablet by mouth 2 (two) times daily.      DULoxetine (CYMBALTA) 30 MG capsule TAKE ONE CAPSULE BY MOUTH TWICE DAILY 180 capsule 3    famotidine (PEPCID) 20 MG tablet TAKE 1 TABLET(20 MG) BY MOUTH TWICE DAILY 180 tablet 1    gabapentin (NEURONTIN) 300 MG capsule TAKE 1 CAPSULE(300 MG) BY MOUTH TWICE DAILY AS NEEDED FOR PAIN 180 capsule 3    losartan (COZAAR) 25 MG tablet Take 12.5 mg by mouth daily.      Multiple Vitamins-Minerals (MULTIVITAMIN WITH MINERALS) tablet Take 1 tablet by mouth daily.      Polyethyl Glycol-Propyl Glycol (SYSTANE) 0.4-0.3 % GEL ophthalmic gel Place 1 application into both eyes 3 (three) times daily as needed (dry eye).      zolpidem (AMBIEN) 5 MG tablet TAKE 1 TABLET BY MOUTH EVERY DAY AT BEDTIME FOR SLEEP 90 tablet 1      Assessment: 81 yo F presented to [facility] with c/o pleuritic chest pain, no radiographic findings for PNA. D-dimer 1.58, more concerned for suspected PE. Plan for VQ scan and transfer to hospital. Pt was not taking any anticoagulation prior to admission.   Heparin level at goal this morning. No cbc done this am, will order for tomorrow. No bleeding or IV issues noted.   Goal of Therapy:  Heparin level 0.3-0.7 units/ml Monitor platelets by anticoagulation protocol: Yes   Plan:  -Continue heparin at 950 units/hr -Heparin level and CBC in am  Erin Hearing PharmD., BCPS Clinical Pharmacist 07/05/2022 8:22 AM

## 2022-07-07 ENCOUNTER — Telehealth: Payer: Medicare Other | Admitting: Family Medicine

## 2022-07-13 NOTE — ED Provider Notes (Signed)
[facility] Provider Note   CSN: [id number] Arrival date & time: 07/03/22  1246     History  Chief Complaint  Patient presents with   Chest Pain    Sandra Craig is a 81 y.o. 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  Had an episode yesterday of severe central and right-sided chest pain that she has no history of. Radiated to back and right arm. Was just sitting when the pain started. A/w mild SOB and it went away after a few hours. Now has pleuritic chest pain ongoing. No f/c, SOB, palpitations, lightheadedness, n/v, diaphoresis, extremity weakness or numbness/tingling. Some cough. No hemoptysis.  Went to UC today and was sent to ER.   HPI     Home Medications Prior to Admission medications   Medication Sig Start Date End Date Taking? Authorizing Provider  tiZANidine (ZANAFLEX) 4 MG tablet Take 2 mg by mouth 3 (three) times daily as needed. 06/14/22  Yes [provider]  amLODipine (NORVASC) 2.5 MG tablet Take 2.5 mg by mouth daily.    [provider]  aspirin 81 MG tablet Take 1 tablet (81 mg total) by mouth daily. 01/23/18   Frann Rider, NP  atorvastatin (LIPITOR) 40 MG tablet Take 1 tablet (40 mg total) by mouth daily. 06/22/22   [name], MD  calcium-vitamin D 250-100 MG-UNIT tablet Take 1 tablet by mouth 2 (two) times daily.    [provider]  DULoxetine (CYMBALTA) 30 MG capsule TAKE ONE CAPSULE BY MOUTH TWICE DAILY 07/28/21   [name], MD  famotidine (PEPCID) 20 MG tablet TAKE 1 TABLET(20 MG) BY MOUTH TWICE DAILY 02/14/22   [name], MD  gabapentin (NEURONTIN) 300 MG capsule TAKE 1 CAPSULE(300 MG) BY MOUTH TWICE DAILY AS NEEDED FOR PAIN 05/18/22   [name], MD  losartan (COZAAR) 25 MG tablet Take 12.5 mg by mouth daily. 02/10/22   [provider]  Multiple Vitamins-Minerals (MULTIVITAMIN  WITH MINERALS) tablet Take 1 tablet by mouth daily.    [provider]  Polyethyl Glycol-Propyl Glycol (SYSTANE) 0.4-0.3 % GEL ophthalmic gel Place 1 application into both eyes 3 (three) times daily as needed (dry eye).    [provider]  zolpidem (AMBIEN) 5 MG tablet TAKE 1 TABLET BY MOUTH EVERY DAY AT BEDTIME FOR SLEEP 04/15/22   [name], MD      Allergies    Azithromycin and Celecoxib    Review of Systems   Review of Systems Review of systems Negative for f/c.  A 10 point review of systems was performed and is negative unless otherwise reported in HPI.  Physical Exam Updated Vital Signs BP 121/81   Pulse 81   Temp 98.2 F (36.8 C) (Oral)   Resp 15   Ht 5' 4.75" (1.645 m)   Wt 63.5 kg   LMP 08/25/1992   SpO2 97%   BMI 23.48 kg/m  Physical Exam General: Normal appearing female, lying in bed.  HEENT: PERRLA, Sclera anicteric, MMM, trachea midline.  Cardiology: RRR, no murmurs/rubs/gallops. BL radial and DP pulses equal bilaterally.  Resp: Normal respiratory rate and effort. CTAB, no wheezes, rhonchi, crackles.  Abd: Soft, non-tender, non-distended. No rebound tenderness or guarding.  GU: Deferred. MSK: No peripheral edema or signs of trauma. Extremities without deformity or TTP. No cyanosis or clubbing. Skin: warm, dry. No rashes or lesions. Back: No  CVA tenderness Neuro: A&Ox4, CNs II-XII grossly intact. MAEs. Sensation grossly intact.  Psych: Normal mood and affect.   ED Results / Procedures / Treatments   Labs (all labs ordered are listed, but only abnormal results are displayed) Labs Reviewed  BASIC METABOLIC PANEL - Abnormal; Notable for the following components:      Result Value   Glucose, Bld 104 (*)    BUN 27 (*)    Creatinine, Ser 1.92 (*)    Calcium 8.8 (*)    GFR, Estimated 26 (*)    All other components within normal limits  CBC - Abnormal; Notable for the following components:   RBC 3.81 (*)    Hemoglobin 11.0 (*)    HCT  35.2 (*)    All other components within normal limits  D-DIMER, QUANTITATIVE - Abnormal; Notable for the following components:   D-Dimer, Quant 1.58 (*)    All other components within normal limits  CBC - Abnormal; Notable for the following components:   RBC 3.77 (*)    Hemoglobin 11.1 (*)    HCT 34.6 (*)    All other components within normal limits  HEPARIN LEVEL (UNFRACTIONATED) - Abnormal; Notable for the following components:   Heparin Unfractionated 1.03 (*)    All other components within normal limits  BASIC METABOLIC PANEL - Abnormal; Notable for the following components:   Creatinine, Ser 1.59 (*)    GFR, Estimated 33 (*)    All other components within normal limits  MAGNESIUM  HEPARIN LEVEL (UNFRACTIONATED)  HEPARIN LEVEL (UNFRACTIONATED)  TROPONIN I (HIGH SENSITIVITY)  TROPONIN I (HIGH SENSITIVITY)    EKG EKG Interpretation  Date/Time:  Sunday July 03 2022 13:00:31 EST Ventricular Rate:  88 PR Interval:  160 QRS Duration: 82 QT Interval:  362 QTC Calculation: 438 R Axis:   17 Text Interpretation: Normal sinus rhythm Anterior infarct , age undetermined Abnormal ECG No significant change since last tracing Confirmed by Deno Etienne [phone]) on 07/03/2022 3:04:15 PM  Radiology   Procedures Procedures    Medications Ordered in ED Medications  acetaminophen (TYLENOL) tablet 1,000 mg (1,000 mg Oral Given 07/03/22 1425)  heparin bolus via infusion 4,500 Units (4,500 Units Intravenous Bolus from Bag 07/03/22 1857)  technetium albumin aggregated (MAA) injection solution 4.1 millicurie (4.1 millicuries Intravenous Contrast Given 07/05/22 1125)    ED Course/ Medical Decision Making/ A&P                          Medical Decision Making Amount and/or Complexity of Data Reviewed Labs: ordered. Radiology: ordered.  Risk OTC drugs. Decision regarding hospitalization.    This patient presents to the ED for concern of chest pain, this involves an extensive number  of treatment options, and is a complaint that carries with it a high risk of complications and morbidity.  I considered the following differential and admission for this acute, potentially life threatening condition.  MDM:    DDX for chest pain includes but is not limited to:  ACS/arrhythmia, PE especially with pleuritic pain, aortic dissection especially with pain radiating to back, PNA, PTX, esophogeal rupture, biliary disease, cardiac tamponade, pericarditis, GERD/PUD/gastritis, or musculoskeletal pain. Patient is overall very well appearing, vitally stable, and has no further back pain. No chest pain at rest, only with deep inspiration. She has equal pulses in all extremities and overall clinical history with self-resolved pain is not consistent with dissection. However, pleuritic pain c/f PE. She has no e/o DVT  on exam but will obtain D-dimer and reassess. Will also obtain EKG/troponin as well as basic labs.    Clinical Course as of 07/13/22 1223  Sun Jul 03, 2022  1620 Patient is signed out to the oncoming ED physician who is made aware of her history, presentation, exam, workup, and plan.  Plan is to obtain repeat troponin and D-dimer. If D-dimer is negative, believe patient can follow-up outpatient.  [HN]    Clinical Course User Index [HN] Audley Hose, MD   D/w oncoming physician who will likely order CT scan for further evaluation.   Labs: I Ordered, and personally interpreted labs.  The pertinent results include:  troponin 5, AKI with Cr 1.92 up from BL 1.6, BUN 28, no electrolyte abnormalities, WBC 9.4, Hgb 11.0, glucose 104.   Imaging Studies ordered: I ordered imaging studies including CT I independently visualized and interpreted imaging. I agree with the radiologist interpretation  Additional history obtained from husband at bedside.    Cardiac Monitoring: The patient was maintained on a cardiac monitor.  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   Chronic insomnia    Cystitis    Depression    Diverticulitis of intestine without perforation or abscess without bleeding    Patient did have abscess but noperforation   Diverticulosis of colon (without mention of hemorrhage)    Endometriosis    Family history of malignant neoplasm of gastrointestinal tract    Fibromyalgia    Gastritis    Hiatal hernia    HIATAL HERNIA 10/08/2008   Qualifier: Diagnosis of  By: Nils Pyle CMA (AAMA), Mearl Latin     History of gallstones    HSV-1 infection    Hyperlipidemia    Hypertension    IBS (irritable bowel syndrome)    IC (interstitial cystitis)    Internal hemorrhoid    Osteonecrosis ([place])    Osteopenia    Osteoporosis    Palpitations    Polycystic kidney disease    Pulmonary nodule 07/2007   5 mm Anterior RUL   Small bowel obstruction ([facility])    Stroke ([facility])    TIA     Medicines Meds ordered this encounter  Medications   acetaminophen (TYLENOL) tablet 1,000 mg   heparin bolus via infusion 4,500 Units   DISCONTD: heparin ADULT infusion 100 units/mL (25000 units/264mL)   DISCONTD: heparin ADULT infusion 100 units/mL (25000 units/267mL)   DISCONTD: atorvastatin (LIPITOR) tablet 40 mg   DISCONTD: losartan (COZAAR) tablet 12.5 mg   DISCONTD: DULoxetine (CYMBALTA) DR capsule 30 mg    TAKE ONE CAPSULE BY MOUTH TWICE DAILY     DISCONTD: zolpidem (AMBIEN) tablet 5 mg    TAKE 1 TABLET BY MOUTH EVERY DAY AT BEDTIME FOR SLEEP     DISCONTD: gabapentin (NEURONTIN) capsule 300 mg   DISCONTD: acetaminophen (TYLENOL) tablet 650 mg   DISCONTD: acetaminophen (TYLENOL) suppository 650 mg   DISCONTD: oxyCODONE (Oxy IR/ROXICODONE) immediate release tablet 5 mg    DISCONTD: ondansetron (ZOFRAN) tablet 4 mg   DISCONTD: ondansetron (ZOFRAN) injection 4 mg   technetium albumin aggregated (MAA) injection solution 4.1 millicurie    I have reviewed the patients home medicines and have made adjustments as needed  Problem  List / ED Course: Problem List Items Addressed This Visit   None Visit Diagnoses     Nonspecific chest pain    -  Primary                 This note was created using dictation software, which may contain spelling or grammatical errors.    Audley Hose, MD 07/13/22 1254

## 2022-07-14 ENCOUNTER — Encounter (HOSPITAL_COMMUNITY)
Admission: RE | Admit: 2022-07-14 | Discharge: 2022-07-14 | Disposition: A | Discharge status: Home / Self Care | Payer: Medicare Other | Source: Ambulatory Visit | Attending: Thoracic Surgery (Cardiothoracic Vascular Surgery) | Admitting: Thoracic Surgery (Cardiothoracic Vascular Surgery)

## 2022-07-14 DIAGNOSIS — I2510 Atherosclerotic heart disease of native coronary artery without angina pectoris: Secondary | ICD-10-CM | POA: Diagnosis not present

## 2022-07-14 DIAGNOSIS — N2889 Other specified disorders of kidney and ureter: Secondary | ICD-10-CM | POA: Diagnosis not present

## 2022-07-14 DIAGNOSIS — R9389 Abnormal findings on diagnostic imaging of other specified body structures: Secondary | ICD-10-CM | POA: Insufficient documentation

## 2022-07-14 DIAGNOSIS — J479 Bronchiectasis, uncomplicated: Secondary | ICD-10-CM | POA: Diagnosis not present

## 2022-07-14 DIAGNOSIS — I700 Atherosclerosis of aorta: Secondary | ICD-10-CM | POA: Diagnosis not present

## 2022-07-14 DIAGNOSIS — J9859 Other diseases of mediastinum, not elsewhere classified: Secondary | ICD-10-CM | POA: Diagnosis not present

## 2022-07-14 MED ORDER — FLUDEOXYGLUCOSE F - 18 (FDG) INJECTION
7.3200 | Freq: Once | INTRAVENOUS | Status: AC | PRN
  Administered 2022-07-14: 7.32 via INTRAVENOUS

## 2022-07-20 ENCOUNTER — Telehealth: Payer: Self-pay | Admitting: Emergency Medicine

## 2022-07-20 ENCOUNTER — Ambulatory Visit (HOSPITAL_COMMUNITY): Payer: Medicare Other

## 2022-07-20 NOTE — Telephone Encounter (Signed)
Called and left voicemail for patient to call office back. I updated her that we do have that office note from September 2020. And I asked her to call office back to let us know how she would like to receive the office note pick up or mailed.

## 2022-07-20 NOTE — Telephone Encounter (Signed)
PT has called medical records for Dr's notes from appt in Sept 2020 regarding a lung nod. Pt has called Records and they said they do not have them but to check with [place]. Please call to advise. She is having surgery the  Dec 27th and needs them. [phone]

## 2022-07-21 NOTE — Telephone Encounter (Signed)
Called patient back to let her know that her husband is able to pick up her office notes for her if she wants him to. I went over the office notes she needed and made sure that all three from the year 2020 were printed along with Dr Lamonte Sakai progress notes. Nothing further needed

## 2022-07-21 NOTE — Telephone Encounter (Signed)
PT called asking Can her husband pick them up? Pls call @ [phone]

## 2022-07-27 ENCOUNTER — Institutional Professional Consult (permissible substitution): Payer: Medicare Other | Admitting: Thoracic Surgery (Cardiothoracic Vascular Surgery)

## 2022-07-27 ENCOUNTER — Other Ambulatory Visit: Payer: Self-pay | Admitting: *Deleted

## 2022-07-27 ENCOUNTER — Encounter: Payer: Self-pay | Admitting: *Deleted

## 2022-07-27 VITALS — BP 165/90 | HR 78 | Resp 20 | Ht 64.75 in | Wt 140.0 lb

## 2022-07-27 DIAGNOSIS — J9859 Other diseases of mediastinum, not elsewhere classified: Secondary | ICD-10-CM | POA: Diagnosis not present

## 2022-07-27 NOTE — H&P (View-Only) (Signed)
PCP is [name], MD Referring Provider is Edwin Dada,*  Chief Complaint  Patient presents with   Mediastinal Mass    Surgical consult, Chest CT 07/03/22/ PET Scan 07/14/22    HPI: Sandra Craig is sent for consultation regarding an anterior mediastinal mass.  Sandra Craig is a an 81 year old woman with a past medical history significant hypertension, hyperlipidemia, MAC, fibromyalgia, endometriosis, hiatal hernia, arthritis, interstitial cystitis, osteopenia, osteoporosis, small bowel obstruction, and TIAs.  She was in her usual state of health until 07/03/2022 when she presented with severe central and right-sided chest pain.  The pain lasted a few hours.  She was admitted.  She was initially started on blood thinners but the CT showed no evidence of a pulmonary embolus.  However, it did show an anterior mediastinal mass.  She ruled out for MI.  Echocardiogram showed normal left-ventricular function with no significant valvular pathology.  She was discharged on day 2.  She denies any fevers, chills, loss of appetite, or weight loss.  No further episodes of the chest pain which led to the diagnosis.  She does have a chronic dry cough and chronic hoarseness.  Zubrod Score: At the time of surgery this patient's most appropriate activity status/level should be described as: _0     0    Normal activity, no symptoms _1     1    Restricted in physical strenuous activity but ambulatory, able to do out light work _2     2    Ambulatory and capable of self care, unable to do work activities, up and about >50 % of waking hours                              _3     3    Only limited self care, in bed greater than 50% of waking hours _4     4    Completely disabled, no self care, confined to bed or chair _5     5    Moribund  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   Chronic insomnia    Cystitis    Depression     Diverticulitis of intestine without perforation or abscess without bleeding    Patient did have abscess but noperforation   Diverticulosis of colon (without mention of hemorrhage)    Endometriosis    Family history of malignant neoplasm of gastrointestinal tract    Fibromyalgia    Gastritis    Hiatal hernia    HIATAL HERNIA 10/08/2008   Qualifier: Diagnosis of  By: Nils Pyle CMA (AAMA), Mearl Latin     History of gallstones    HSV-1 infection    Hyperlipidemia    Hypertension    IBS (irritable bowel syndrome)    IC (interstitial cystitis)    Internal hemorrhoid    Osteonecrosis ([place])    Osteopenia    Osteoporosis    Palpitations    Polycystic kidney disease    Pulmonary nodule 07/2007   5 mm Anterior RUL   Small bowel obstruction ([place])    Stroke ([place])    TIA    Past Surgical History:  Procedure Laterality Date   ABDOMINAL HYSTERECTOMY  1994   TAH,BSO FOR ENDOMETRIOSIS   ABDOMINAL SURGERY  2011   small intestine blockage   CHOLECYSTECTOMY     GASTROPLASTY  2011   small bowel resection -open   INGUINAL  HERNIA REPAIR Right 02/28/2020   Procedure: LAPAROSCOPIC RIGHT INGUINAL HERNIA REPAIR WITH MESH;  Surgeon: Ralene Ok, MD;  Location: [facility];  Service: General;  Laterality: Right;   JOINT REPLACEMENT  2008   OOPHORECTOMY  1994   TAH,BSO   PELVIC LAPAROSCOPY     S/P right shoulder rotater cuff  200216/2011   Tear/adhesive capsulitis   SBO Lap  11   Adhesions and small internal hernia   SHOULDER HEMI-ARTHROPLASTY  06/05/2012   Procedure: SHOULDER HEMI-ARTHROPLASTY;  Surgeon: Johnny Bridge, MD;  Location: [facility];  Service: Orthopedics;  Laterality: Right;  FOR ARTHRITIS   TOTAL HIP ARTHROPLASTY  FALL OF 2008   rt. partial hip replacement   TOTAL SHOULDER ARTHROPLASTY  06/05/2012   Procedure: TOTAL SHOULDER ARTHROPLASTY;  Surgeon: Johnny Bridge, MD;  Location: [facility];  Service: Orthopedics;  Laterality: Right;  RIGHT SHOULDER TOTAL ARTHROPLASTY, HEMIARTHROPLASTY, SHOULDER,  FOR ARTHRITIS   VIDEO BRONCHOSCOPY Bilateral 01/28/2015   Procedure: VIDEO BRONCHOSCOPY WITH FLUORO;  Surgeon: Collene Gobble, MD;  Location: [facility];  Service: Cardiopulmonary;  Laterality: Bilateral;   VIDEO BRONCHOSCOPY Bilateral 04/23/2019   Procedure: VIDEO BRONCHOSCOPY WITHOUT FLUORO;  Surgeon: Collene Gobble, MD;  Location: [facility] ENDOSCOPY;  Service: Cardiopulmonary;  Laterality: Bilateral;    Family History  Problem Relation Age of Onset   Heart disease Mother        MI at age 54   Emphysema Mother    Thyroid disease Mother        Thyroidectomy/Benign   COPD Father    Pancreatic cancer Sister        died 54   Cancer Brother        adenocarcinoma right lung   COPD Brother    Lymphoma Maternal Grandmother    Colon cancer Paternal Grandmother    Heart attack Paternal Grandfather     Social History Social History   Tobacco Use   Smoking status: Former    Packs/day: 0.75    Years: 8.00    Total pack years: 6.00    Types: Cigarettes    Quit date: 08/01/1976    Years since quitting: 46.0   Smokeless tobacco: Never   Tobacco comments:    Quit in 1978  Vaping Use   Vaping Use: Never used  Substance Use Topics   Alcohol use: No    Alcohol/week: 0.0 standard drinks of alcohol   Drug use: No    Current Outpatient Medications  Medication Sig Dispense Refill   amLODipine (NORVASC) 2.5 MG tablet Take 2.5 mg by mouth daily.     aspirin 81 MG tablet Take 1 tablet (81 mg total) by mouth daily. 30 tablet    atorvastatin (LIPITOR) 40 MG tablet Take 1 tablet (40 mg total) by mouth daily. 90 tablet 3   calcium-vitamin D 250-100 MG-UNIT tablet Take 1 tablet by mouth 2 (two) times daily.     DULoxetine (CYMBALTA) 30 MG capsule TAKE ONE CAPSULE BY MOUTH TWICE DAILY 180 capsule 3   famotidine (PEPCID) 20 MG tablet TAKE 1 TABLET(20 MG) BY MOUTH TWICE DAILY 180 tablet 1   gabapentin (NEURONTIN) 300 MG capsule TAKE 1 CAPSULE(300 MG) BY MOUTH TWICE DAILY AS NEEDED FOR PAIN 180 capsule  3   losartan (COZAAR) 25 MG tablet Take 12.5 mg by mouth daily.     Multiple Vitamins-Minerals (MULTIVITAMIN WITH MINERALS) tablet Take 1 tablet by mouth daily.     Polyethyl Glycol-Propyl Glycol (SYSTANE) 0.4-0.3 % GEL ophthalmic gel Place  1 application into both eyes 3 (three) times daily as needed (dry eye).     tiZANidine (ZANAFLEX) 4 MG tablet Take 2 mg by mouth 3 (three) times daily as needed.     zolpidem (AMBIEN) 5 MG tablet TAKE 1 TABLET BY MOUTH EVERY DAY AT BEDTIME FOR SLEEP 90 tablet 1   No current facility-administered medications for this visit.    Allergies  Allergen Reactions   Azithromycin Rash    Pruritic rash diffuse  Other reaction(s): RASH   Celecoxib Nausea Only    Review of Systems  Constitutional:  Negative for activity change, appetite change, chills, diaphoresis, fever and unexpected weight change.  HENT:  Positive for trouble swallowing and voice change.   Eyes:  Negative for visual disturbance.  Respiratory:  Positive for cough. Negative for shortness of breath and wheezing.   Cardiovascular:  Positive for chest pain (See HPI). Negative for palpitations and leg swelling.  Gastrointestinal:  Positive for abdominal pain (Reflux). Negative for abdominal distention.  Genitourinary:  Negative for difficulty urinating and dysuria.  Musculoskeletal:  Positive for arthralgias and joint swelling.  Neurological:  Positive for dizziness (Resolved after losartan was discontinued). Negative for syncope and headaches.  Hematological:  Negative for adenopathy. Bruises/bleeds easily.  All other systems reviewed and are negative.   BP (!) 165/90   Pulse 78   Resp 20   Ht 5' 4.75" (1.645 m)   Wt 140 lb (63.5 kg)   LMP 08/25/1992   SpO2 95% Comment: RA  BMI 23.48 kg/m  Physical Exam Vitals reviewed.  Constitutional:      General: She is not in acute distress.    Appearance: Normal appearance.  HENT:     Head: Normocephalic and atraumatic.  Eyes:     General:  No scleral icterus.    Extraocular Movements: Extraocular movements intact.  Cardiovascular:     Rate and Rhythm: Normal rate and regular rhythm.     Heart sounds: Normal heart sounds. No murmur heard. Pulmonary:     Effort: Pulmonary effort is normal. No respiratory distress.     Breath sounds: Normal breath sounds. No wheezing.  Abdominal:     General: There is no distension.     Palpations: Abdomen is soft.     Tenderness: There is no abdominal tenderness.  Musculoskeletal:     Cervical back: Neck supple.  Lymphadenopathy:     Cervical: No cervical adenopathy.  Skin:    General: Skin is warm and dry.  Neurological:     General: No focal deficit present.     Mental Status: She is alert and oriented to person, place, and time.     Cranial Nerves: No cranial nerve deficit.     Diagnostic Tests: CT CHEST WITHOUT CONTRAST   TECHNIQUE: Multidetector CT imaging of the chest was performed following the standard protocol without IV contrast.   RADIATION DOSE REDUCTION: This exam was performed according to the departmental dose-optimization program which includes automated exposure control, adjustment of the mA and/or kV according to patient size and/or use of iterative reconstruction technique.   COMPARISON:  Prior CT scan of the chest 03/19/2019   FINDINGS: Cardiovascular: Limited evaluation in the absence of intravenous contrast. No aneurysm. Scattered atherosclerotic calcifications throughout the thoracic aorta. The heart is normal in size. No pericardial effusion.   Mediastinum/Nodes: Significant interval enlargement of prevascular lymph node which now nearly fills the anterior mediastinum measuring approximately 4.5 x 4.0 cm compared to a maximum 1.6 cm previously. The   remaining mediastinum appears unremarkable.   Lungs/Pleura: Similar cluster of tree in bud nodularity and focal bronchiectasis in the right upper lobe. Bronchiectasis has progressed in there is more  linear pleuroparenchymal scarring than prior. Additional foci of tree-in-bud micro nodularity are also present within the medial aspect of the lingula and throughout the periphery of left upper lobe. Findings are slightly progressed compared to prior. Mild dependent atelectasis. Trace bilateral pleural effusions.   Upper Abdomen: No acute abnormality within the upper abdomen. Circumscribed water attenuation simple cyst exophytic from the anterior aspect of the left kidney is similar compared to prior imaging. The gallbladder is surgically absent.   Musculoskeletal: No acute fracture or aggressive appearing lytic or blastic osseous lesion.   IMPRESSION: 1. Significant interval enlargement of prevascular lymph node which now fills the anterior mediastinum and measures 4.5 x 4.0 cm compared to a maximum of 1.6 cm on prior imaging from August of 2020. Differential considerations include thymoma, lymphoma, teratoma, thyroid neoplasm and other metastatic disease. 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 2. 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 3. Aortic atherosclerotic vascular calcifications. 4. Trace bilateral pleural effusions and associated bibasilar atelectasis.   Aortic Atherosclerosis (ICD10-I70.0).     Electronically Signed   By: Jacqulynn Cadet M.D.   On: 07/03/2022 15:14 NUCLEAR MEDICINE PET SKULL BASE TO THIGH   TECHNIQUE: 7.3 mCi F-18 FDG was injected intravenously. Full-ring PET imaging was performed from the skull base to thigh after the radiotracer. CT data was obtained and used for attenuation correction and anatomic localization.   Fasting blood glucose: 83 mg/dl   COMPARISON:  CT chest 07/03/2022.   FINDINGS: Mediastinal blood pool activity: SUV max 3.1   Liver activity: SUV max 3.5    NECK:   No abnormal hypermetabolism.   Incidental CT findings:   None.   CHEST:   Peripherally hypermetabolic prevascular mass measures 3.1 x 4.1 cm, SUV max 6.1. No additional hypermetabolic lymph nodes. Bronchiectasis with increasing peribronchovascular nodularity and ground-glass in the apical and posterior segments of the right upper lobe, with associated hypermetabolism.   Incidental CT findings:   Atherosclerotic calcification of the aorta and coronary arteries. Heart is at the upper limits of normal in size. No pericardial or pleural effusion. Scattered peribronchovascular nodularity, as before.   ABDOMEN/PELVIS:   No abnormal hypermetabolism.   Incidental CT findings:   Liver and adrenal glands are unremarkable. Low-attenuation lesions in the kidneys measure up to 3.5 cm on the left. No specific follow-up necessary. Scattered parenchymal calcification in the kidneys. Spleen, pancreas, stomach and bowel are grossly unremarkable. Atherosclerotic calcification of the aorta.   SKELETON:   No abnormal hypermetabolism.   Incidental CT findings:   Right hip arthroplasty. Degenerative changes in the spine. Right shoulder arthroplasty.   IMPRESSION: 1. Slightly necrotic anterior mediastinal mass with peripheral hypermetabolism, worrisome for thymic carcinoma. Lack of additional hypermetabolic adenopathy makes lymphoma less likely. No evidence of distant metastatic disease. 2. Increasing peribronchovascular nodularity and ground-glass in the posterior segment right upper lobe, indicative of an infectious bronchiolitis or chronic mycobacterium avium complex. 3. Aortic atherosclerosis (ICD10-I70.0). Coronary artery calcification.     Electronically Signed   By: Lorin Picket M.D.   On: 07/15/2022 15:53   I personally reviewed the CT and PET/CT images.  There is a 4.5 cm anterior mediastinal mass.  Peripheral hypermetabolism with central necrosis.  Aortic  and coronary atherosclerosis.  Multiple groundglass nodules particularly posterior segment right upper lobe.  Also metabolic activity associated with that area.  Impression: Sandra Craig is an 81 year old woman with a past medical history significant hypertension, hyperlipidemia, MAC, fibromyalgia, endometriosis, hiatal hernia, arthritis, interstitial cystitis, osteopenia, osteoporosis, small bowel obstruction, and TIAs.  She presented a few weeks ago with chest pain.  She ruled out for MI and pulmonary embolus.  During her workup she was found to have a 4.5 cm anterior mediastinal mass.  In retrospect this mass was present on CTs in 2020 and measured about 1.6 cm at that time.  On PET/CT the mass is hypermetabolic with central necrosis.  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  Differential diagnosis also includes adenopathy secondary to MAC.  I think that is extremely unlikely in this case as there is not any other significant adenopathy.  Same holds true for lymphoma.  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  In either event the treatment is surgical resection.  I discussed the proposed surgical procedure with Sandra Craig and her family.  The plan would be to do a robotic assisted left VATS for resection of the anterior mediastinal mass.  They understand that it is possible we might need to convert to a sternotomy depending on intraoperative findings.  I informed them of the general nature of the procedure including the need for general anesthesia, the incisions to be used, the use of the surgical robot, the use of a drainage tube postoperatively, the expected hospital stay, and the overall recovery.  I informed her of the indications, risks, benefits, and alternatives.  She understands the risks include, but are not limited to death, MI, DVT, PE, bleeding, possible need for transfusion, infection, recurrent or phrenic nerve injury, as  well as possibility of other unforeseeable complications.  It is a little concerning that she has chronic coarseness although I think that is more likely related to a cough rather than a recurrent nerve involvement.  Plan:  Robotic assisted left VATS for resection of anterior mediastinal mass, possible sternotomy on Friday, 08/05/2021.  I spent over an hour today in review of records, images, and in consultation with Sandra Craig and her family. [name], MD [facility] [phone]

## 2022-07-27 NOTE — Progress Notes (Signed)
PCP is [name], MD Referring Provider is Edwin Dada,*  Chief Complaint  Patient presents with   Mediastinal Mass    Surgical consult, Chest CT 07/03/22/ PET Scan 07/14/22    HPI: Sandra Craig is sent for consultation regarding an anterior mediastinal mass.  Sandra Craig is a an 81 year old woman with a past medical history significant hypertension, hyperlipidemia, MAC, fibromyalgia, endometriosis, hiatal hernia, arthritis, interstitial cystitis, osteopenia, osteoporosis, small bowel obstruction, and TIAs.  She was in her usual state of health until 07/03/2022 when she presented with severe central and right-sided chest pain.  The pain lasted a few hours.  She was admitted.  She was initially started on blood thinners but the CT showed no evidence of a pulmonary embolus.  However, it did show an anterior mediastinal mass.  She ruled out for MI.  Echocardiogram showed normal left-ventricular function with no significant valvular pathology.  She was discharged on day 2.  She denies any fevers, chills, loss of appetite, or weight loss.  No further episodes of the chest pain which led to the diagnosis.  She does have a chronic dry cough and chronic hoarseness.  Zubrod Score: At the time of surgery this patient's most appropriate activity status/level should be described as: _0     0    Normal activity, no symptoms _1     1    Restricted in physical strenuous activity but ambulatory, able to do out light work _2     2    Ambulatory and capable of self care, unable to do work activities, up and about >50 % of waking hours                              _3     3    Only limited self care, in bed greater than 50% of waking hours _4     4    Completely disabled, no self care, confined to bed or chair _5     5    Moribund  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   Chronic insomnia    Cystitis    Depression     Diverticulitis of intestine without perforation or abscess without bleeding    Patient did have abscess but noperforation   Diverticulosis of colon (without mention of hemorrhage)    Endometriosis    Family history of malignant neoplasm of gastrointestinal tract    Fibromyalgia    Gastritis    Hiatal hernia    HIATAL HERNIA 10/08/2008   Qualifier: Diagnosis of  By: Nils Pyle CMA (AAMA), Mearl Latin     History of gallstones    HSV-1 infection    Hyperlipidemia    Hypertension    IBS (irritable bowel syndrome)    IC (interstitial cystitis)    Internal hemorrhoid    Osteonecrosis ([place])    Osteopenia    Osteoporosis    Palpitations    Polycystic kidney disease    Pulmonary nodule 07/2007   5 mm Anterior RUL   Small bowel obstruction ([place])    Stroke ([place])    TIA    Past Surgical History:  Procedure Laterality Date   ABDOMINAL HYSTERECTOMY  1994   TAH,BSO FOR ENDOMETRIOSIS   ABDOMINAL SURGERY  2011   small intestine blockage   CHOLECYSTECTOMY     GASTROPLASTY  2011   small bowel resection -open   INGUINAL  HERNIA REPAIR Right 02/28/2020   Procedure: LAPAROSCOPIC RIGHT INGUINAL HERNIA REPAIR WITH MESH;  Surgeon: Ralene Ok, MD;  Location: [facility];  Service: General;  Laterality: Right;   JOINT REPLACEMENT  2008   OOPHORECTOMY  1994   TAH,BSO   PELVIC LAPAROSCOPY     S/P right shoulder rotater cuff  200216/2011   Tear/adhesive capsulitis   SBO Lap  11   Adhesions and small internal hernia   SHOULDER HEMI-ARTHROPLASTY  06/05/2012   Procedure: SHOULDER HEMI-ARTHROPLASTY;  Surgeon: Johnny Bridge, MD;  Location: [facility];  Service: Orthopedics;  Laterality: Right;  FOR ARTHRITIS   TOTAL HIP ARTHROPLASTY  FALL OF 2008   rt. partial hip replacement   TOTAL SHOULDER ARTHROPLASTY  06/05/2012   Procedure: TOTAL SHOULDER ARTHROPLASTY;  Surgeon: Johnny Bridge, MD;  Location: [facility];  Service: Orthopedics;  Laterality: Right;  RIGHT SHOULDER TOTAL ARTHROPLASTY, HEMIARTHROPLASTY, SHOULDER,  FOR ARTHRITIS   VIDEO BRONCHOSCOPY Bilateral 01/28/2015   Procedure: VIDEO BRONCHOSCOPY WITH FLUORO;  Surgeon: Collene Gobble, MD;  Location: [facility];  Service: Cardiopulmonary;  Laterality: Bilateral;   VIDEO BRONCHOSCOPY Bilateral 04/23/2019   Procedure: VIDEO BRONCHOSCOPY WITHOUT FLUORO;  Surgeon: Collene Gobble, MD;  Location: [facility] ENDOSCOPY;  Service: Cardiopulmonary;  Laterality: Bilateral;    Family History  Problem Relation Age of Onset   Heart disease Mother        MI at age 54   Emphysema Mother    Thyroid disease Mother        Thyroidectomy/Benign   COPD Father    Pancreatic cancer Sister        died 54   Cancer Brother        adenocarcinoma right lung   COPD Brother    Lymphoma Maternal Grandmother    Colon cancer Paternal Grandmother    Heart attack Paternal Grandfather     Social History Social History   Tobacco Use   Smoking status: Former    Packs/day: 0.75    Years: 8.00    Total pack years: 6.00    Types: Cigarettes    Quit date: 08/01/1976    Years since quitting: 46.0   Smokeless tobacco: Never   Tobacco comments:    Quit in 1978  Vaping Use   Vaping Use: Never used  Substance Use Topics   Alcohol use: No    Alcohol/week: 0.0 standard drinks of alcohol   Drug use: No    Current Outpatient Medications  Medication Sig Dispense Refill   amLODipine (NORVASC) 2.5 MG tablet Take 2.5 mg by mouth daily.     aspirin 81 MG tablet Take 1 tablet (81 mg total) by mouth daily. 30 tablet    atorvastatin (LIPITOR) 40 MG tablet Take 1 tablet (40 mg total) by mouth daily. 90 tablet 3   calcium-vitamin D 250-100 MG-UNIT tablet Take 1 tablet by mouth 2 (two) times daily.     DULoxetine (CYMBALTA) 30 MG capsule TAKE ONE CAPSULE BY MOUTH TWICE DAILY 180 capsule 3   famotidine (PEPCID) 20 MG tablet TAKE 1 TABLET(20 MG) BY MOUTH TWICE DAILY 180 tablet 1   gabapentin (NEURONTIN) 300 MG capsule TAKE 1 CAPSULE(300 MG) BY MOUTH TWICE DAILY AS NEEDED FOR PAIN 180 capsule  3   losartan (COZAAR) 25 MG tablet Take 12.5 mg by mouth daily.     Multiple Vitamins-Minerals (MULTIVITAMIN WITH MINERALS) tablet Take 1 tablet by mouth daily.     Polyethyl Glycol-Propyl Glycol (SYSTANE) 0.4-0.3 % GEL ophthalmic gel Place  1 application into both eyes 3 (three) times daily as needed (dry eye).     tiZANidine (ZANAFLEX) 4 MG tablet Take 2 mg by mouth 3 (three) times daily as needed.     zolpidem (AMBIEN) 5 MG tablet TAKE 1 TABLET BY MOUTH EVERY DAY AT BEDTIME FOR SLEEP 90 tablet 1   No current facility-administered medications for this visit.    Allergies  Allergen Reactions   Azithromycin Rash    Pruritic rash diffuse  Other reaction(s): RASH   Celecoxib Nausea Only    Review of Systems  Constitutional:  Negative for activity change, appetite change, chills, diaphoresis, fever and unexpected weight change.  HENT:  Positive for trouble swallowing and voice change.   Eyes:  Negative for visual disturbance.  Respiratory:  Positive for cough. Negative for shortness of breath and wheezing.   Cardiovascular:  Positive for chest pain (See HPI). Negative for palpitations and leg swelling.  Gastrointestinal:  Positive for abdominal pain (Reflux). Negative for abdominal distention.  Genitourinary:  Negative for difficulty urinating and dysuria.  Musculoskeletal:  Positive for arthralgias and joint swelling.  Neurological:  Positive for dizziness (Resolved after losartan was discontinued). Negative for syncope and headaches.  Hematological:  Negative for adenopathy. Bruises/bleeds easily.  All other systems reviewed and are negative.   BP (!) 165/90   Pulse 78   Resp 20   Ht 5' 4.75" (1.645 m)   Wt 140 lb (63.5 kg)   LMP 08/25/1992   SpO2 95% Comment: RA  BMI 23.48 kg/m  Physical Exam Vitals reviewed.  Constitutional:      General: She is not in acute distress.    Appearance: Normal appearance.  HENT:     Head: Normocephalic and atraumatic.  Eyes:     General:  No scleral icterus.    Extraocular Movements: Extraocular movements intact.  Cardiovascular:     Rate and Rhythm: Normal rate and regular rhythm.     Heart sounds: Normal heart sounds. No murmur heard. Pulmonary:     Effort: Pulmonary effort is normal. No respiratory distress.     Breath sounds: Normal breath sounds. No wheezing.  Abdominal:     General: There is no distension.     Palpations: Abdomen is soft.     Tenderness: There is no abdominal tenderness.  Musculoskeletal:     Cervical back: Neck supple.  Lymphadenopathy:     Cervical: No cervical adenopathy.  Skin:    General: Skin is warm and dry.  Neurological:     General: No focal deficit present.     Mental Status: She is alert and oriented to person, place, and time.     Cranial Nerves: No cranial nerve deficit.     Diagnostic Tests: CT CHEST WITHOUT CONTRAST   TECHNIQUE: Multidetector CT imaging of the chest was performed following the standard protocol without IV contrast.   RADIATION DOSE REDUCTION: This exam was performed according to the departmental dose-optimization program which includes automated exposure control, adjustment of the mA and/or kV according to patient size and/or use of iterative reconstruction technique.   COMPARISON:  Prior CT scan of the chest 03/19/2019   FINDINGS: Cardiovascular: Limited evaluation in the absence of intravenous contrast. No aneurysm. Scattered atherosclerotic calcifications throughout the thoracic aorta. The heart is normal in size. No pericardial effusion.   Mediastinum/Nodes: Significant interval enlargement of prevascular lymph node which now nearly fills the anterior mediastinum measuring approximately 4.5 x 4.0 cm compared to a maximum 1.6 cm previously. The  remaining mediastinum appears unremarkable.   Lungs/Pleura: Similar cluster of tree in bud nodularity and focal bronchiectasis in the right upper lobe. Bronchiectasis has progressed in there is more  linear pleuroparenchymal scarring than prior. Additional foci of tree-in-bud micro nodularity are also present within the medial aspect of the lingula and throughout the periphery of left upper lobe. Findings are slightly progressed compared to prior. Mild dependent atelectasis. Trace bilateral pleural effusions.   Upper Abdomen: No acute abnormality within the upper abdomen. Circumscribed water attenuation simple cyst exophytic from the anterior aspect of the left kidney is similar compared to prior imaging. The gallbladder is surgically absent.   Musculoskeletal: No acute fracture or aggressive appearing lytic or blastic osseous lesion.   IMPRESSION: 1. Significant interval enlargement of prevascular lymph node which now fills the anterior mediastinum and measures 4.5 x 4.0 cm compared to a maximum of 1.6 cm on prior imaging from August of 2020. Differential considerations include thymoma, lymphoma, teratoma, thyroid neoplasm and other metastatic disease. 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 2. 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 3. Aortic atherosclerotic vascular calcifications. 4. Trace bilateral pleural effusions and associated bibasilar atelectasis.   Aortic Atherosclerosis (ICD10-I70.0).     Electronically Signed   By: Jacqulynn Cadet M.D.   On: 07/03/2022 15:14 NUCLEAR MEDICINE PET SKULL BASE TO THIGH   TECHNIQUE: 7.3 mCi F-18 FDG was injected intravenously. Full-ring PET imaging was performed from the skull base to thigh after the radiotracer. CT data was obtained and used for attenuation correction and anatomic localization.   Fasting blood glucose: 83 mg/dl   COMPARISON:  CT chest 07/03/2022.   FINDINGS: Mediastinal blood pool activity: SUV max 3.1   Liver activity: SUV max 3.5    NECK:   No abnormal hypermetabolism.   Incidental CT findings:   None.   CHEST:   Peripherally hypermetabolic prevascular mass measures 3.1 x 4.1 cm, SUV max 6.1. No additional hypermetabolic lymph nodes. Bronchiectasis with increasing peribronchovascular nodularity and ground-glass in the apical and posterior segments of the right upper lobe, with associated hypermetabolism.   Incidental CT findings:   Atherosclerotic calcification of the aorta and coronary arteries. Heart is at the upper limits of normal in size. No pericardial or pleural effusion. Scattered peribronchovascular nodularity, as before.   ABDOMEN/PELVIS:   No abnormal hypermetabolism.   Incidental CT findings:   Liver and adrenal glands are unremarkable. Low-attenuation lesions in the kidneys measure up to 3.5 cm on the left. No specific follow-up necessary. Scattered parenchymal calcification in the kidneys. Spleen, pancreas, stomach and bowel are grossly unremarkable. Atherosclerotic calcification of the aorta.   SKELETON:   No abnormal hypermetabolism.   Incidental CT findings:   Right hip arthroplasty. Degenerative changes in the spine. Right shoulder arthroplasty.   IMPRESSION: 1. Slightly necrotic anterior mediastinal mass with peripheral hypermetabolism, worrisome for thymic carcinoma. Lack of additional hypermetabolic adenopathy makes lymphoma less likely. No evidence of distant metastatic disease. 2. Increasing peribronchovascular nodularity and ground-glass in the posterior segment right upper lobe, indicative of an infectious bronchiolitis or chronic mycobacterium avium complex. 3. Aortic atherosclerosis (ICD10-I70.0). Coronary artery calcification.     Electronically Signed   By: Lorin Picket M.D.   On: 07/15/2022 15:53   I personally reviewed the CT and PET/CT images.  There is a 4.5 cm anterior mediastinal mass.  Peripheral hypermetabolism with central necrosis.  Aortic  and coronary atherosclerosis.  Multiple groundglass nodules particularly posterior segment right upper lobe.  Also metabolic activity associated with that area.  Impression: Sandra Craig is an 81 year old woman with a past medical history significant hypertension, hyperlipidemia, MAC, fibromyalgia, endometriosis, hiatal hernia, arthritis, interstitial cystitis, osteopenia, osteoporosis, small bowel obstruction, and TIAs.  She presented a few weeks ago with chest pain.  She ruled out for MI and pulmonary embolus.  During her workup she was found to have a 4.5 cm anterior mediastinal mass.  In retrospect this mass was present on CTs in 2020 and measured about 1.6 cm at that time.  On PET/CT the mass is hypermetabolic with central necrosis.  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  Differential diagnosis also includes adenopathy secondary to MAC.  I think that is extremely unlikely in this case as there is not any other significant adenopathy.  Same holds true for lymphoma.  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  In either event the treatment is surgical resection.  I discussed the proposed surgical procedure with Sandra Craig and her family.  The plan would be to do a robotic assisted left VATS for resection of the anterior mediastinal mass.  They understand that it is possible we might need to convert to a sternotomy depending on intraoperative findings.  I informed them of the general nature of the procedure including the need for general anesthesia, the incisions to be used, the use of the surgical robot, the use of a drainage tube postoperatively, the expected hospital stay, and the overall recovery.  I informed her of the indications, risks, benefits, and alternatives.  She understands the risks include, but are not limited to death, MI, DVT, PE, bleeding, possible need for transfusion, infection, recurrent or phrenic nerve injury, as  well as possibility of other unforeseeable complications.  It is a little concerning that she has chronic coarseness although I think that is more likely related to a cough rather than a recurrent nerve involvement.  Plan:  Robotic assisted left VATS for resection of anterior mediastinal mass, possible sternotomy on Friday, 08/05/2021.  I spent over an hour today in review of records, images, and in consultation with Mrs. Elza and her family. [name], MD [facility] [phone]

## 2022-07-29 ENCOUNTER — Encounter: Payer: Self-pay | Admitting: Thoracic Surgery (Cardiothoracic Vascular Surgery)

## 2022-08-02 NOTE — Pre-Procedure Instructions (Signed)
Surgical Instructions    Your procedure is scheduled on Friday, January 5.  Report to [facility] Main Entrance "A" at 5:30 A.M., then check in with the Admitting office.  Call this number if you have problems the morning of surgery:  [phone]   If you have any questions prior to your surgery date call [phone]: Open Monday-Friday 8am-4pm If you experience any cold or flu symptoms such as cough, fever, chills, shortness of breath, etc. between now and your scheduled surgery, please notify us at the above number     Remember:  Do not eat or drink after midnight the night before your surgery     Take these medicines the morning of surgery with A SIP OF WATER:  amLODipine (NORVASC)  atorvastatin (LIPITOR)  DULoxetine (CYMBALTA)  famotidine (PEPCID)  gabapentin (NEURONTIN)  acetaminophen (TYLENOL)  if needed  Follow your surgeon's instructions on when to stop Aspirin.  If no instructions were given by your surgeon then you will need to call the office to get those instructions.    As of today, STOP taking any  Aleve, Naproxen, Ibuprofen, Motrin, Advil, Goody's, BC's, all herbal medications, fish oil, and all vitamins.      [facility] is not responsible for any belongings or valuables.    Do NOT Smoke (Tobacco/Vaping)  24 hours prior to your procedure  If you use a CPAP at night, you may bring your mask for your overnight stay.   Contacts, glasses, hearing aids, dentures or partials may not be worn into surgery, please bring cases for these belongings   For patients admitted to the hospital, discharge time will be determined by your treatment team.   Patients discharged the day of surgery will not be allowed to drive home, and someone needs to stay with them for 24 hours.   SURGICAL WAITING ROOM VISITATION Patients having surgery or a procedure may have no more than 2 support people in the waiting area - these visitors may rotate.   Children under the age of 41 must  have an adult with them who is not the patient. If the patient needs to stay at the hospital during part of their recovery, the visitor guidelines for inpatient rooms apply. Pre-op nurse will coordinate an appropriate time for 1 support person to accompany patient in pre-op.  This support person may not rotate.   Please refer to [email or web address] for the visitor guidelines for Inpatients (after your surgery is over and you are in a regular room).    Special instructions:    Oral Hygiene is also important to reduce your risk of infection.  Remember - BRUSH YOUR TEETH THE MORNING OF SURGERY WITH YOUR REGULAR TOOTHPASTE   [facility]- Preparing For Surgery  Before surgery, you can play an important role. Because skin is not sterile, your skin needs to be as free of germs as possible. You can reduce the number of germs on your skin by washing with CHG (chlorahexidine gluconate) Soap before surgery.  CHG is an antiseptic cleaner which kills germs and bonds with the skin to continue killing germs even after washing.     Please do not use if you have an allergy to CHG or antibacterial soaps. If your skin becomes reddened/irritated stop using the CHG.  Do not shave (including legs and underarms) for at least 48 hours prior to first CHG shower. It is OK to shave your face.  Please follow these instructions carefully.     Shower the [employer]  SURGERY and the MORNING OF SURGERY with CHG Soap.   If you chose to wash your hair, wash your hair first as usual with your normal shampoo. After you shampoo, rinse your hair and body thoroughly to remove the shampoo.  Then [employer] and genitals (private parts) with your normal soap and rinse thoroughly to remove soap.  After that Use CHG Soap as you would any other liquid soap. You can apply CHG directly to the skin and wash gently with a scrungie or a clean washcloth.   Apply the CHG Soap to your body  ONLY FROM THE NECK DOWN.  Do not use on open wounds or open sores. Avoid contact with your eyes, ears, mouth and genitals (private parts). Wash Face and genitals (private parts)  with your normal soap.   Wash thoroughly, paying special attention to the area where your surgery will be performed.  Thoroughly rinse your body with warm water from the neck down.  DO NOT shower/wash with your normal soap after using and rinsing off the CHG Soap.  Pat yourself dry with a CLEAN TOWEL.  Wear CLEAN PAJAMAS to bed the night before surgery  Place CLEAN SHEETS on your bed the night before your surgery  DO NOT SLEEP WITH PETS.   Day of Surgery:  Take a shower with CHG soap. Wear Clean/Comfortable clothing the morning of surgery Do not wear jewelry or makeup. Do not wear lotions, powders, perfumes/cologne or deodorant. Do not shave 48 hours prior to surgery.  Men may shave face and neck. Do not bring valuables to the hospital. Do not wear nail polish, gel polish, artificial nails, or any other type of covering on natural nails (fingers and toes) If you have artificial nails or gel coating that need to be removed by a nail salon, please have this removed prior to surgery. Artificial nails or gel coating may interfere with anesthesia's ability to adequately monitor your vital signs. Remember to brush your teeth WITH YOUR REGULAR TOOTHPASTE.    If you received a COVID test during your pre-op visit, it is requested that you wear a mask when out in public, stay away from anyone that may not be feeling well, and notify your surgeon if you develop symptoms. If you have been in contact with anyone that has tested positive in the last 10 days, please notify your surgeon.    Please read over the following fact sheets that you were given.

## 2022-08-03 ENCOUNTER — Encounter (HOSPITAL_COMMUNITY)
Admission: RE | Admit: 2022-08-03 | Discharge: 2022-08-03 | Disposition: A | Discharge status: Home / Self Care | Payer: Medicare Other | Source: Ambulatory Visit | Attending: Thoracic Surgery (Cardiothoracic Vascular Surgery) | Admitting: Thoracic Surgery (Cardiothoracic Vascular Surgery)

## 2022-08-03 ENCOUNTER — Encounter (HOSPITAL_COMMUNITY): Payer: Self-pay

## 2022-08-03 ENCOUNTER — Other Ambulatory Visit: Payer: Self-pay | Admitting: Family Medicine

## 2022-08-03 ENCOUNTER — Other Ambulatory Visit: Payer: Self-pay

## 2022-08-03 ENCOUNTER — Ambulatory Visit (HOSPITAL_COMMUNITY)
Admission: RE | Admit: 2022-08-03 | Discharge: 2022-08-03 | Disposition: A | Discharge status: Home / Self Care | Payer: Medicare Other | Source: Ambulatory Visit | Attending: Thoracic Surgery (Cardiothoracic Vascular Surgery) | Admitting: Thoracic Surgery (Cardiothoracic Vascular Surgery)

## 2022-08-03 VITALS — BP 111/50 | HR 98 | Temp 98.0°F | Resp 18 | Ht 64.0 in | Wt 146.5 lb

## 2022-08-03 DIAGNOSIS — Z1152 Encounter for screening for COVID-19: Secondary | ICD-10-CM | POA: Insufficient documentation

## 2022-08-03 DIAGNOSIS — M797 Fibromyalgia: Secondary | ICD-10-CM | POA: Diagnosis not present

## 2022-08-03 DIAGNOSIS — Z01818 Encounter for other preprocedural examination: Secondary | ICD-10-CM | POA: Insufficient documentation

## 2022-08-03 DIAGNOSIS — C381 Malignant neoplasm of anterior mediastinum: Secondary | ICD-10-CM | POA: Diagnosis not present

## 2022-08-03 DIAGNOSIS — J9811 Atelectasis: Secondary | ICD-10-CM | POA: Diagnosis not present

## 2022-08-03 DIAGNOSIS — M1990 Unspecified osteoarthritis, unspecified site: Secondary | ICD-10-CM | POA: Diagnosis not present

## 2022-08-03 DIAGNOSIS — K449 Diaphragmatic hernia without obstruction or gangrene: Secondary | ICD-10-CM | POA: Diagnosis not present

## 2022-08-03 DIAGNOSIS — J9859 Other diseases of mediastinum, not elsewhere classified: Secondary | ICD-10-CM | POA: Diagnosis not present

## 2022-08-03 DIAGNOSIS — Z87891 Personal history of nicotine dependence: Secondary | ICD-10-CM | POA: Diagnosis not present

## 2022-08-03 DIAGNOSIS — J479 Bronchiectasis, uncomplicated: Secondary | ICD-10-CM | POA: Diagnosis not present

## 2022-08-03 DIAGNOSIS — Z452 Encounter for adjustment and management of vascular access device: Secondary | ICD-10-CM | POA: Diagnosis not present

## 2022-08-03 DIAGNOSIS — F5104 Psychophysiologic insomnia: Secondary | ICD-10-CM | POA: Diagnosis not present

## 2022-08-03 DIAGNOSIS — Z8673 Personal history of transient ischemic attack (TIA), and cerebral infarction without residual deficits: Secondary | ICD-10-CM | POA: Diagnosis not present

## 2022-08-03 DIAGNOSIS — J90 Pleural effusion, not elsewhere classified: Secondary | ICD-10-CM | POA: Diagnosis not present

## 2022-08-03 DIAGNOSIS — R490 Dysphonia: Secondary | ICD-10-CM | POA: Diagnosis not present

## 2022-08-03 DIAGNOSIS — I129 Hypertensive chronic kidney disease with stage 1 through stage 4 chronic kidney disease, or unspecified chronic kidney disease: Secondary | ICD-10-CM | POA: Diagnosis not present

## 2022-08-03 DIAGNOSIS — J939 Pneumothorax, unspecified: Secondary | ICD-10-CM | POA: Diagnosis not present

## 2022-08-03 DIAGNOSIS — N3010 Interstitial cystitis (chronic) without hematuria: Secondary | ICD-10-CM | POA: Diagnosis not present

## 2022-08-03 DIAGNOSIS — A310 Pulmonary mycobacterial infection: Secondary | ICD-10-CM | POA: Diagnosis not present

## 2022-08-03 DIAGNOSIS — F32A Depression, unspecified: Secondary | ICD-10-CM | POA: Diagnosis not present

## 2022-08-03 DIAGNOSIS — M810 Age-related osteoporosis without current pathological fracture: Secondary | ICD-10-CM | POA: Diagnosis not present

## 2022-08-03 DIAGNOSIS — Z79899 Other long term (current) drug therapy: Secondary | ICD-10-CM | POA: Diagnosis not present

## 2022-08-03 DIAGNOSIS — E785 Hyperlipidemia, unspecified: Secondary | ICD-10-CM | POA: Diagnosis not present

## 2022-08-03 DIAGNOSIS — Z90710 Acquired absence of both cervix and uterus: Secondary | ICD-10-CM | POA: Diagnosis not present

## 2022-08-03 DIAGNOSIS — R918 Other nonspecific abnormal finding of lung field: Secondary | ICD-10-CM | POA: Diagnosis not present

## 2022-08-03 DIAGNOSIS — N1832 Chronic kidney disease, stage 3b: Secondary | ICD-10-CM | POA: Diagnosis not present

## 2022-08-03 DIAGNOSIS — N189 Chronic kidney disease, unspecified: Secondary | ICD-10-CM | POA: Diagnosis not present

## 2022-08-03 DIAGNOSIS — I700 Atherosclerosis of aorta: Secondary | ICD-10-CM | POA: Diagnosis not present

## 2022-08-03 DIAGNOSIS — C37 Malignant neoplasm of thymus: Secondary | ICD-10-CM | POA: Diagnosis not present

## 2022-08-03 DIAGNOSIS — R911 Solitary pulmonary nodule: Secondary | ICD-10-CM | POA: Diagnosis not present

## 2022-08-03 DIAGNOSIS — D62 Acute posthemorrhagic anemia: Secondary | ICD-10-CM | POA: Diagnosis not present

## 2022-08-03 DIAGNOSIS — R222 Localized swelling, mass and lump, trunk: Secondary | ICD-10-CM | POA: Diagnosis not present

## 2022-08-03 HISTORY — DX: Anxiety disorder, unspecified: F41.9

## 2022-08-03 HISTORY — DX: Pulmonary mycobacterial infection: A31.0

## 2022-08-03 HISTORY — DX: Chronic kidney disease, unspecified: N18.9

## 2022-08-03 LAB — CBC
HCT: 35.9 % — ABNORMAL LOW (ref 36.0–46.0)
Hemoglobin: 11.6 g/dL — ABNORMAL LOW (ref 12.0–15.0)
MCH: 30.1 pg (ref 26.0–34.0)
MCHC: 32.3 g/dL (ref 30.0–36.0)
MCV: 93 fL (ref 80.0–100.0)
Platelets: 153 10*3/uL (ref 150–400)
RBC: 3.86 MIL/uL — ABNORMAL LOW (ref 3.87–5.11)
RDW: 14.6 % (ref 11.5–15.5)
WBC: 5.7 10*3/uL (ref 4.0–10.5)
nRBC: 0 % (ref 0.0–0.2)

## 2022-08-03 LAB — COMPREHENSIVE METABOLIC PANEL
ALT: 60 U/L — ABNORMAL HIGH (ref 0–44)
AST: 62 U/L — ABNORMAL HIGH (ref 15–41)
Albumin: 3.3 g/dL — ABNORMAL LOW (ref 3.5–5.0)
Alkaline Phosphatase: 80 U/L (ref 38–126)
Anion gap: 12 (ref 5–15)
BUN: 29 mg/dL — ABNORMAL HIGH (ref 8–23)
CO2: 22 mmol/L (ref 22–32)
Calcium: 8.9 mg/dL (ref 8.9–10.3)
Chloride: 105 mmol/L (ref 98–111)
Creatinine, Ser: 1.76 mg/dL — ABNORMAL HIGH (ref 0.44–1.00)
GFR, Estimated: 29 mL/min — ABNORMAL LOW (ref >60)
Glucose, Bld: 168 mg/dL — ABNORMAL HIGH (ref 70–99)
Potassium: 4.3 mmol/L (ref 3.5–5.1)
Sodium: 139 mmol/L (ref 135–145)
Total Bilirubin: 0.7 mg/dL (ref 0.3–1.2)
Total Protein: 6 g/dL — ABNORMAL LOW (ref 6.5–8.1)

## 2022-08-03 LAB — URINALYSIS, ROUTINE W REFLEX MICROSCOPIC
Bacteria, UA: NONE SEEN
Bilirubin Urine: NEGATIVE
Glucose, UA: NEGATIVE mg/dL
Hgb urine dipstick: NEGATIVE
Ketones, ur: NEGATIVE mg/dL
Nitrite: NEGATIVE
Protein, ur: NEGATIVE mg/dL
Specific Gravity, Urine: 1.014 (ref 1.005–1.030)
pH: 6 (ref 5.0–8.0)

## 2022-08-03 LAB — BLOOD GAS, ARTERIAL
Acid-Base Excess: 2.7 mmol/L — ABNORMAL HIGH (ref 0.0–2.0)
Bicarbonate: 27.9 mmol/L (ref 20.0–28.0)
Drawn by: 58793
O2 Saturation: 99.1 %
Patient temperature: 37
pCO2 arterial: 44 mmHg (ref 32–48)
pH, Arterial: 7.41 (ref 7.35–7.45)
pO2, Arterial: 106 mmHg (ref 83–108)

## 2022-08-03 LAB — SURGICAL PCR SCREEN
MRSA, PCR: NEGATIVE
Staphylococcus aureus: NEGATIVE

## 2022-08-03 LAB — APTT: aPTT: 41 seconds — ABNORMAL HIGH (ref 24–36)

## 2022-08-03 LAB — PROTIME-INR
INR: 1.1 (ref 0.8–1.2)
Prothrombin Time: 13.8 seconds (ref 11.4–15.2)

## 2022-08-03 NOTE — Progress Notes (Signed)
PCP - Garret Reddish Cardiologist - pt states that she does not have a cardiologist Neurologist- Dr Leonie Man at [facility]- pt reports she has not seen him in a while Nephrologist- Pt unsure of name of MD as it has changed recently.  Pt reports she saw MD in the last couple of months- records requested from [employer].   PPM/ICD - denies   Chest x-ray - 08/03/2022  EKG - 07/06/22 Stress Test - pt reports normal stress test over 10 years ago ECHO - 07/05/22 Cardiac Cath - denies  Sleep Study - N/A   Fasting Blood Sugar - N/A   Last dose of GLP1 agonist-  N/A   Blood Thinner Instructions: N/A Aspirin Instructions: Follow your surgeon's instructions on when to stop Aspirin.  If no instructions were given by your surgeon then you will need to call the office to get those instructions.     ERAS Protcol - NPO order  COVID TEST- 08/03/2022    Anesthesia review: Follow up on requested records. Pt states she was having issues with her BP medications earlier this year which caused some dizziness. Pt reports she has had no issues since her BP medications were changed. Pt reports her nephrologist helps adjust her BP meds.   Patient denies shortness of breath, fever, cough and chest pain at PAT appointment   All instructions explained to the patient, with a verbal understanding of the material. Patient agrees to go over the instructions while at home for a better understanding. Patient also instructed to self quarantine after being tested for COVID-19. The opportunity to ask questions was provided.

## 2022-08-04 LAB — SARS CORONAVIRUS 2 (TAT 6-24 HRS): SARS Coronavirus 2: NEGATIVE

## 2022-08-04 NOTE — Anesthesia Preprocedure Evaluation (Addendum)
Anesthesia Evaluation  Patient identified by MRN, date of birth, ID band Patient awake    Reviewed: Allergy & Precautions, NPO status , Patient's Chart, lab work & pertinent test results  History of Anesthesia Complications Negative for: history of anesthetic complications  Airway Mallampati: II  TM Distance: >3 FB Neck ROM: Full    Dental  (+) Dental Advisory Given, Teeth Intact   Pulmonary former smoker   Pulmonary exam normal        Cardiovascular hypertension, Pt. on medications + Peripheral Vascular Disease  Normal cardiovascular exam   '23 TTE - EF 60 to 65%. Grade I diastolic dysfunction (impaired relaxation). 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 Differential considerations include thymoma, lymphoma, teratoma, thyroid neoplasm and other metastatic disease. 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 2. 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 3. Aortic atherosclerotic vascular calcifications. 4. Trace bilateral pleural effusions and associated bibasilar atelectasis.    Reproductive/Obstetrics                             Anesthesia  Physical Anesthesia Plan  ASA: 3  Anesthesia Plan: General   Post-op Pain Management: Tylenol PO (pre-op)*   Induction: Intravenous  PONV Risk Score and Plan: 3 and Treatment may vary due to age or medical condition, Ondansetron, Propofol infusion and Dexamethasone  Airway Management Planned: Double Lumen EBT  Additional Equipment: Arterial line and CVP  Intra-op Plan:   Post-operative Plan: Possible Post-op intubation/ventilation  Informed Consent: I have reviewed the patients History and Physical, chart, labs and discussed the procedure including the risks, benefits and alternatives for the proposed anesthesia with the patient or authorized representative who has indicated his/her understanding and acceptance.     Dental advisory given  Plan Discussed with: CRNA, Anesthesiologist and Surgeon  Anesthesia Plan Comments: (Will have glidescope in room)       Anesthesia Quick Evaluation

## 2022-08-05 ENCOUNTER — Inpatient Hospital Stay (HOSPITAL_COMMUNITY): Payer: Medicare Other | Admitting: Physician Assistant

## 2022-08-05 ENCOUNTER — Encounter (HOSPITAL_COMMUNITY): Payer: Self-pay | Admitting: Thoracic Surgery (Cardiothoracic Vascular Surgery)

## 2022-08-05 ENCOUNTER — Other Ambulatory Visit: Payer: Self-pay

## 2022-08-05 ENCOUNTER — Inpatient Hospital Stay (HOSPITAL_COMMUNITY)
Admission: RE | Admit: 2022-08-05 | Discharge: 2022-08-07 | DRG: 827 | Disposition: A | Discharge status: Home / Self Care | Payer: Medicare Other | Source: Ambulatory Visit | Attending: Thoracic Surgery (Cardiothoracic Vascular Surgery) | Admitting: Thoracic Surgery (Cardiothoracic Vascular Surgery)

## 2022-08-05 ENCOUNTER — Inpatient Hospital Stay (HOSPITAL_COMMUNITY): Payer: Medicare Other

## 2022-08-05 ENCOUNTER — Encounter (HOSPITAL_COMMUNITY)
Admission: RE | Disposition: A | Discharge status: Home / Self Care | Payer: Self-pay | Source: Ambulatory Visit | Attending: Thoracic Surgery (Cardiothoracic Vascular Surgery)

## 2022-08-05 DIAGNOSIS — J479 Bronchiectasis, uncomplicated: Secondary | ICD-10-CM | POA: Diagnosis present

## 2022-08-05 DIAGNOSIS — Z1152 Encounter for screening for COVID-19: Secondary | ICD-10-CM

## 2022-08-05 DIAGNOSIS — J90 Pleural effusion, not elsewhere classified: Secondary | ICD-10-CM | POA: Diagnosis present

## 2022-08-05 DIAGNOSIS — C37 Malignant neoplasm of thymus: Secondary | ICD-10-CM | POA: Diagnosis present

## 2022-08-05 DIAGNOSIS — M1990 Unspecified osteoarthritis, unspecified site: Secondary | ICD-10-CM | POA: Diagnosis present

## 2022-08-05 DIAGNOSIS — C381 Malignant neoplasm of anterior mediastinum: Secondary | ICD-10-CM

## 2022-08-05 DIAGNOSIS — R490 Dysphonia: Secondary | ICD-10-CM | POA: Diagnosis present

## 2022-08-05 DIAGNOSIS — Z8673 Personal history of transient ischemic attack (TIA), and cerebral infarction without residual deficits: Secondary | ICD-10-CM

## 2022-08-05 DIAGNOSIS — Z96641 Presence of right artificial hip joint: Secondary | ICD-10-CM | POA: Diagnosis present

## 2022-08-05 DIAGNOSIS — N3010 Interstitial cystitis (chronic) without hematuria: Secondary | ICD-10-CM | POA: Diagnosis present

## 2022-08-05 DIAGNOSIS — Z85118 Personal history of other malignant neoplasm of bronchus and lung: Secondary | ICD-10-CM | POA: Diagnosis present

## 2022-08-05 DIAGNOSIS — J9859 Other diseases of mediastinum, not elsewhere classified: Secondary | ICD-10-CM | POA: Diagnosis present

## 2022-08-05 DIAGNOSIS — I129 Hypertensive chronic kidney disease with stage 1 through stage 4 chronic kidney disease, or unspecified chronic kidney disease: Secondary | ICD-10-CM | POA: Diagnosis present

## 2022-08-05 DIAGNOSIS — I700 Atherosclerosis of aorta: Secondary | ICD-10-CM | POA: Diagnosis present

## 2022-08-05 DIAGNOSIS — K449 Diaphragmatic hernia without obstruction or gangrene: Secondary | ICD-10-CM | POA: Diagnosis present

## 2022-08-05 DIAGNOSIS — F32A Depression, unspecified: Secondary | ICD-10-CM | POA: Diagnosis present

## 2022-08-05 DIAGNOSIS — Z87891 Personal history of nicotine dependence: Secondary | ICD-10-CM | POA: Diagnosis not present

## 2022-08-05 DIAGNOSIS — N189 Chronic kidney disease, unspecified: Secondary | ICD-10-CM

## 2022-08-05 DIAGNOSIS — Z9049 Acquired absence of other specified parts of digestive tract: Secondary | ICD-10-CM

## 2022-08-05 DIAGNOSIS — Z90710 Acquired absence of both cervix and uterus: Secondary | ICD-10-CM | POA: Diagnosis not present

## 2022-08-05 DIAGNOSIS — E785 Hyperlipidemia, unspecified: Secondary | ICD-10-CM | POA: Diagnosis present

## 2022-08-05 DIAGNOSIS — Z79899 Other long term (current) drug therapy: Secondary | ICD-10-CM | POA: Diagnosis not present

## 2022-08-05 DIAGNOSIS — Z90722 Acquired absence of ovaries, bilateral: Secondary | ICD-10-CM

## 2022-08-05 DIAGNOSIS — R222 Localized swelling, mass and lump, trunk: Secondary | ICD-10-CM | POA: Diagnosis not present

## 2022-08-05 DIAGNOSIS — Z8249 Family history of ischemic heart disease and other diseases of the circulatory system: Secondary | ICD-10-CM

## 2022-08-05 DIAGNOSIS — Z9079 Acquired absence of other genital organ(s): Secondary | ICD-10-CM

## 2022-08-05 DIAGNOSIS — A310 Pulmonary mycobacterial infection: Secondary | ICD-10-CM | POA: Diagnosis present

## 2022-08-05 DIAGNOSIS — Z7982 Long term (current) use of aspirin: Secondary | ICD-10-CM

## 2022-08-05 DIAGNOSIS — N1832 Chronic kidney disease, stage 3b: Secondary | ICD-10-CM | POA: Diagnosis present

## 2022-08-05 DIAGNOSIS — Z96611 Presence of right artificial shoulder joint: Secondary | ICD-10-CM | POA: Diagnosis present

## 2022-08-05 DIAGNOSIS — D62 Acute posthemorrhagic anemia: Secondary | ICD-10-CM | POA: Diagnosis not present

## 2022-08-05 DIAGNOSIS — Z800 Family history of malignant neoplasm of digestive organs: Secondary | ICD-10-CM

## 2022-08-05 DIAGNOSIS — M797 Fibromyalgia: Secondary | ICD-10-CM | POA: Diagnosis present

## 2022-08-05 DIAGNOSIS — M810 Age-related osteoporosis without current pathological fracture: Secondary | ICD-10-CM | POA: Diagnosis present

## 2022-08-05 DIAGNOSIS — J9811 Atelectasis: Secondary | ICD-10-CM | POA: Diagnosis present

## 2022-08-05 DIAGNOSIS — R911 Solitary pulmonary nodule: Secondary | ICD-10-CM | POA: Diagnosis present

## 2022-08-05 DIAGNOSIS — F5104 Psychophysiologic insomnia: Secondary | ICD-10-CM | POA: Diagnosis present

## 2022-08-05 DIAGNOSIS — Z881 Allergy status to other antibiotic agents status: Secondary | ICD-10-CM

## 2022-08-05 LAB — PREPARE RBC (CROSSMATCH)

## 2022-08-05 LAB — POCT I-STAT 7, (LYTES, BLD GAS, ICA,H+H)
Acid-base deficit: 2 mmol/L (ref 0.0–2.0)
Bicarbonate: 23.5 mmol/L (ref 20.0–28.0)
Calcium, Ion: 1.24 mmol/L (ref 1.15–1.40)
HCT: 30 % — ABNORMAL LOW (ref 36.0–46.0)
Hemoglobin: 10.2 g/dL — ABNORMAL LOW (ref 12.0–15.0)
O2 Saturation: 99 %
Potassium: 3.9 mmol/L (ref 3.5–5.1)
Sodium: 140 mmol/L (ref 135–145)
TCO2: 25 mmol/L (ref 22–32)
pCO2 arterial: 44.1 mmHg (ref 32–48)
pH, Arterial: 7.334 — ABNORMAL LOW (ref 7.35–7.45)
pO2, Arterial: 137 mmHg — ABNORMAL HIGH (ref 83–108)

## 2022-08-05 SURGERY — EXCISION, MASS, MEDIASTINUM, ROBOT-ASSISTED
Anesthesia: General | Laterality: Left

## 2022-08-05 MED ORDER — SODIUM CHLORIDE 0.9% IV SOLUTION: Freq: Once | INTRAVENOUS | Status: DC

## 2022-08-05 MED ORDER — ROCURONIUM BROMIDE 10 MG/ML (PF) SYRINGE
PREFILLED_SYRINGE | INTRAVENOUS | Status: AC
  Filled 2022-08-05: qty 10

## 2022-08-05 MED ORDER — OXYCODONE HCL 5 MG PO TABS: 5.0000 mg | ORAL_TABLET | Freq: Once | ORAL | Status: DC | PRN

## 2022-08-05 MED ORDER — AMLODIPINE BESYLATE 2.5 MG PO TABS
2.5000 mg | ORAL_TABLET | Freq: Every day | ORAL | Status: DC
  Administered 2022-08-06: 2.5 mg via ORAL
  Filled 2022-08-05: qty 1

## 2022-08-05 MED ORDER — DEXAMETHASONE SODIUM PHOSPHATE 10 MG/ML IJ SOLN
INTRAMUSCULAR | Status: AC
  Filled 2022-08-05: qty 1

## 2022-08-05 MED ORDER — ONDANSETRON HCL 4 MG/2ML IJ SOLN: 4.0000 mg | Freq: Once | INTRAMUSCULAR | Status: DC | PRN

## 2022-08-05 MED ORDER — FENTANYL CITRATE (PF) 100 MCG/2ML IJ SOLN
INTRAMUSCULAR | Status: AC
  Filled 2022-08-05: qty 2

## 2022-08-05 MED ORDER — FENTANYL CITRATE (PF) 250 MCG/5ML IJ SOLN
INTRAMUSCULAR | Status: DC | PRN
  Administered 2022-08-05 (×4): 50 ug via INTRAVENOUS

## 2022-08-05 MED ORDER — CHLORHEXIDINE GLUCONATE 0.12 % MT SOLN
15.0000 mL | Freq: Once | OROMUCOSAL | Status: AC
  Administered 2022-08-05: 15 mL via OROMUCOSAL
  Filled 2022-08-05: qty 15

## 2022-08-05 MED ORDER — ENOXAPARIN SODIUM 40 MG/0.4ML IJ SOSY: 40.0000 mg | PREFILLED_SYRINGE | Freq: Every day | INTRAMUSCULAR | Status: DC

## 2022-08-05 MED ORDER — GABAPENTIN 300 MG PO CAPS
300.0000 mg | ORAL_CAPSULE | Freq: Every day | ORAL | Status: DC
  Administered 2022-08-05 – 2022-08-06 (×2): 300 mg via ORAL
  Filled 2022-08-05 (×2): qty 1

## 2022-08-05 MED ORDER — ACETAMINOPHEN 500 MG PO TABS
1000.0000 mg | ORAL_TABLET | Freq: Four times a day (QID) | ORAL | Status: DC
  Administered 2022-08-05 – 2022-08-07 (×7): 1000 mg via ORAL
  Filled 2022-08-05 (×7): qty 2

## 2022-08-05 MED ORDER — 0.9 % SODIUM CHLORIDE (POUR BTL) OPTIME
TOPICAL | Status: DC | PRN
  Administered 2022-08-05: 2000 mL

## 2022-08-05 MED ORDER — SODIUM CHLORIDE 0.9 % IV SOLN
INTRAVENOUS | Status: AC | PRN
  Administered 2022-08-05: 1000 mL

## 2022-08-05 MED ORDER — GABAPENTIN 300 MG PO CAPS: 300.0000 mg | ORAL_CAPSULE | Freq: Two times a day (BID) | ORAL | Status: DC

## 2022-08-05 MED ORDER — ROCURONIUM BROMIDE 10 MG/ML (PF) SYRINGE
PREFILLED_SYRINGE | INTRAVENOUS | Status: DC | PRN
  Administered 2022-08-05: 20 mg via INTRAVENOUS
  Administered 2022-08-05: 60 mg via INTRAVENOUS

## 2022-08-05 MED ORDER — PROPOFOL 10 MG/ML IV BOLUS
INTRAVENOUS | Status: AC
  Filled 2022-08-05: qty 20

## 2022-08-05 MED ORDER — TRAMADOL HCL 50 MG PO TABS
50.0000 mg | ORAL_TABLET | Freq: Four times a day (QID) | ORAL | Status: DC | PRN
  Administered 2022-08-05: 100 mg via ORAL
  Administered 2022-08-05: 50 mg via ORAL
  Administered 2022-08-06: 100 mg via ORAL
  Filled 2022-08-05 (×2): qty 2

## 2022-08-05 MED ORDER — CEFAZOLIN SODIUM-DEXTROSE 2-4 GM/100ML-% IV SOLN
2.0000 g | Freq: Two times a day (BID) | INTRAVENOUS | Status: AC
  Administered 2022-08-05: 2 g via INTRAVENOUS
  Filled 2022-08-05: qty 100

## 2022-08-05 MED ORDER — SUGAMMADEX SODIUM 200 MG/2ML IV SOLN
INTRAVENOUS | Status: DC | PRN
  Administered 2022-08-05: 200 mg via INTRAVENOUS

## 2022-08-05 MED ORDER — SENNOSIDES-DOCUSATE SODIUM 8.6-50 MG PO TABS
1.0000 | ORAL_TABLET | Freq: Every day | ORAL | Status: DC
  Administered 2022-08-06: 1 via ORAL
  Filled 2022-08-05: qty 1

## 2022-08-05 MED ORDER — DULOXETINE HCL 30 MG PO CPEP
30.0000 mg | ORAL_CAPSULE | Freq: Two times a day (BID) | ORAL | Status: DC
  Administered 2022-08-06 – 2022-08-07 (×3): 30 mg via ORAL
  Filled 2022-08-05 (×3): qty 1

## 2022-08-05 MED ORDER — ACETAMINOPHEN 160 MG/5ML PO SOLN: 1000.0000 mg | Freq: Four times a day (QID) | ORAL | Status: DC

## 2022-08-05 MED ORDER — LOSARTAN POTASSIUM 25 MG PO TABS
12.5000 mg | ORAL_TABLET | Freq: Every day | ORAL | Status: DC
  Administered 2022-08-06: 12.5 mg via ORAL
  Filled 2022-08-05 (×2): qty 1

## 2022-08-05 MED ORDER — FENTANYL CITRATE (PF) 250 MCG/5ML IJ SOLN
INTRAMUSCULAR | Status: AC
  Filled 2022-08-05: qty 5

## 2022-08-05 MED ORDER — PHENYLEPHRINE HCL-NACL 20-0.9 MG/250ML-% IV SOLN
INTRAVENOUS | Status: DC | PRN
  Administered 2022-08-05: 20 ug/min via INTRAVENOUS

## 2022-08-05 MED ORDER — MORPHINE SULFATE (PF) 2 MG/ML IV SOLN
2.0000 mg | INTRAVENOUS | Status: DC | PRN
  Administered 2022-08-05: 2 mg via INTRAVENOUS
  Filled 2022-08-05: qty 1

## 2022-08-05 MED ORDER — AMISULPRIDE (ANTIEMETIC) 5 MG/2ML IV SOLN
10.0000 mg | Freq: Once | INTRAVENOUS | Status: AC
  Administered 2022-08-05: 10 mg via INTRAVENOUS

## 2022-08-05 MED ORDER — ORAL CARE MOUTH RINSE: 15.0000 mL | Freq: Once | OROMUCOSAL | Status: AC

## 2022-08-05 MED ORDER — ONDANSETRON HCL 4 MG/2ML IJ SOLN
INTRAMUSCULAR | Status: AC
  Filled 2022-08-05: qty 2

## 2022-08-05 MED ORDER — ZOLPIDEM TARTRATE 5 MG PO TABS
5.0000 mg | ORAL_TABLET | Freq: Every day | ORAL | Status: DC
  Administered 2022-08-05 – 2022-08-06 (×2): 5 mg via ORAL
  Filled 2022-08-05 (×2): qty 1

## 2022-08-05 MED ORDER — LACTATED RINGERS IV SOLN: INTRAVENOUS | Status: DC

## 2022-08-05 MED ORDER — ACETAMINOPHEN 500 MG PO TABS
1000.0000 mg | ORAL_TABLET | Freq: Once | ORAL | Status: DC
  Filled 2022-08-05: qty 2

## 2022-08-05 MED ORDER — BUPIVACAINE LIPOSOME 1.3 % IJ SUSP
INTRAMUSCULAR | Status: DC | PRN
  Administered 2022-08-05: 15 mL

## 2022-08-05 MED ORDER — BUPIVACAINE LIPOSOME 1.3 % IJ SUSP
INTRAMUSCULAR | Status: AC
  Filled 2022-08-05: qty 20

## 2022-08-05 MED ORDER — PANTOPRAZOLE SODIUM 40 MG PO TBEC
40.0000 mg | DELAYED_RELEASE_TABLET | Freq: Every day | ORAL | Status: DC
  Administered 2022-08-06 – 2022-08-07 (×2): 40 mg via ORAL
  Filled 2022-08-05 (×2): qty 1

## 2022-08-05 MED ORDER — CHLORHEXIDINE GLUCONATE CLOTH 2 % EX PADS
6.0000 | MEDICATED_PAD | Freq: Every day | CUTANEOUS | Status: DC
  Administered 2022-08-06: 6 via TOPICAL

## 2022-08-05 MED ORDER — ENOXAPARIN SODIUM 30 MG/0.3ML IJ SOSY
30.0000 mg | PREFILLED_SYRINGE | Freq: Every day | INTRAMUSCULAR | Status: DC
  Administered 2022-08-06 – 2022-08-07 (×2): 30 mg via SUBCUTANEOUS
  Filled 2022-08-05 (×2): qty 0.3

## 2022-08-05 MED ORDER — SODIUM CHLORIDE 0.45 % IV SOLN: INTRAVENOUS | Status: DC

## 2022-08-05 MED ORDER — LACTATED RINGERS IV SOLN: INTRAVENOUS | Status: DC | PRN

## 2022-08-05 MED ORDER — PHENYLEPHRINE 80 MCG/ML (10ML) SYRINGE FOR IV PUSH (FOR BLOOD PRESSURE SUPPORT)
PREFILLED_SYRINGE | INTRAVENOUS | Status: DC | PRN
  Administered 2022-08-05: 80 ug via INTRAVENOUS

## 2022-08-05 MED ORDER — ONDANSETRON HCL 4 MG/2ML IJ SOLN
INTRAMUSCULAR | Status: DC | PRN
  Administered 2022-08-05: 4 mg via INTRAVENOUS

## 2022-08-05 MED ORDER — DEXAMETHASONE SODIUM PHOSPHATE 10 MG/ML IJ SOLN
INTRAMUSCULAR | Status: DC | PRN
  Administered 2022-08-05: 10 mg via INTRAVENOUS

## 2022-08-05 MED ORDER — ESMOLOL HCL 100 MG/10ML IV SOLN
INTRAVENOUS | Status: DC | PRN
  Administered 2022-08-05: 30 mg via INTRAVENOUS

## 2022-08-05 MED ORDER — POLYETHYL GLYCOL-PROPYL GLYCOL 0.4-0.3 % OP GEL: 1.0000 | Freq: Three times a day (TID) | OPHTHALMIC | Status: DC | PRN

## 2022-08-05 MED ORDER — FENTANYL CITRATE (PF) 100 MCG/2ML IJ SOLN
25.0000 ug | INTRAMUSCULAR | Status: DC | PRN
  Administered 2022-08-05 (×2): 25 ug via INTRAVENOUS
  Administered 2022-08-05: 50 ug via INTRAVENOUS

## 2022-08-05 MED ORDER — ATORVASTATIN CALCIUM 40 MG PO TABS
40.0000 mg | ORAL_TABLET | Freq: Every day | ORAL | Status: DC
  Administered 2022-08-06 – 2022-08-07 (×2): 40 mg via ORAL
  Filled 2022-08-05 (×2): qty 1

## 2022-08-05 MED ORDER — ACETAMINOPHEN 500 MG PO TABS
ORAL_TABLET | ORAL | Status: AC
  Filled 2022-08-05: qty 2

## 2022-08-05 MED ORDER — CEFAZOLIN SODIUM-DEXTROSE 2-4 GM/100ML-% IV SOLN
2.0000 g | INTRAVENOUS | Status: AC
  Administered 2022-08-05: 2 g via INTRAVENOUS
  Filled 2022-08-05: qty 100

## 2022-08-05 MED ORDER — BUPIVACAINE HCL (PF) 0.5 % IJ SOLN
INTRAMUSCULAR | Status: AC
  Filled 2022-08-05: qty 30

## 2022-08-05 MED ORDER — BISACODYL 5 MG PO TBEC
10.0000 mg | DELAYED_RELEASE_TABLET | Freq: Every day | ORAL | Status: DC
  Administered 2022-08-05 – 2022-08-07 (×3): 10 mg via ORAL
  Filled 2022-08-05 (×3): qty 2

## 2022-08-05 MED ORDER — CEFAZOLIN SODIUM-DEXTROSE 2-4 GM/100ML-% IV SOLN: 2.0000 g | Freq: Three times a day (TID) | INTRAVENOUS | Status: DC

## 2022-08-05 MED ORDER — KETOROLAC TROMETHAMINE 15 MG/ML IJ SOLN
15.0000 mg | Freq: Once | INTRAMUSCULAR | Status: AC
  Administered 2022-08-05: 15 mg via INTRAVENOUS
  Filled 2022-08-05: qty 1

## 2022-08-05 MED ORDER — OXYCODONE HCL 5 MG/5ML PO SOLN: 5.0000 mg | Freq: Once | ORAL | Status: DC | PRN

## 2022-08-05 MED ORDER — PROPOFOL 10 MG/ML IV BOLUS
INTRAVENOUS | Status: DC | PRN
  Administered 2022-08-05: 160 mg via INTRAVENOUS

## 2022-08-05 MED ORDER — AMISULPRIDE (ANTIEMETIC) 5 MG/2ML IV SOLN
INTRAVENOUS | Status: AC
  Filled 2022-08-05: qty 4

## 2022-08-05 MED ORDER — ESMOLOL HCL 100 MG/10ML IV SOLN
INTRAVENOUS | Status: AC
  Filled 2022-08-05: qty 10

## 2022-08-05 MED ORDER — TRAMADOL HCL 50 MG PO TABS
ORAL_TABLET | ORAL | Status: AC
  Filled 2022-08-05: qty 1

## 2022-08-05 MED ORDER — ONDANSETRON HCL 4 MG/2ML IJ SOLN: 4.0000 mg | Freq: Four times a day (QID) | INTRAMUSCULAR | Status: DC | PRN

## 2022-08-05 MED ORDER — OXYCODONE HCL 5 MG PO TABS
5.0000 mg | ORAL_TABLET | ORAL | Status: DC | PRN
  Administered 2022-08-05: 10 mg via ORAL
  Administered 2022-08-06: 5 mg via ORAL
  Administered 2022-08-06: 10 mg via ORAL
  Filled 2022-08-05: qty 1
  Filled 2022-08-05 (×2): qty 2

## 2022-08-05 SURGICAL SUPPLY — 69 items
BLADE STERNUM SYSTEM 6 (BLADE) IMPLANT
CANNULA REDUC XI 12-8 STAPL (CANNULA)
CANNULA REDUCER 12-8 DVNC XI (CANNULA) IMPLANT
DEFOGGER SCOPE WARMER CLEARIFY (MISCELLANEOUS) ×1 IMPLANT
DERMABOND ADVANCED .7 DNX12 (GAUZE/BANDAGES/DRESSINGS) IMPLANT
DRAIN CHANNEL 19F RND (DRAIN) IMPLANT
DRAIN CONNECTOR BLAKE 1:1 (MISCELLANEOUS) IMPLANT
DRAPE ARM DVNC X/XI (DISPOSABLE) ×4 IMPLANT
DRAPE COLUMN DVNC XI (DISPOSABLE) ×1 IMPLANT
DRAPE CV SPLIT W-CLR ANES SCRN (DRAPES) ×1 IMPLANT
DRAPE DA VINCI XI ARM (DISPOSABLE) ×4
DRAPE DA VINCI XI COLUMN (DISPOSABLE) ×1
DRAPE HALF SHEET 40X57 (DRAPES) IMPLANT
DRAPE ORTHO SPLIT 77X108 STRL (DRAPES) ×1
DRAPE SURG ORHT 6 SPLT 77X108 (DRAPES) ×1 IMPLANT
ELECT REM PT RETURN 9FT ADLT (ELECTROSURGICAL) ×1
ELECTRODE REM PT RTRN 9FT ADLT (ELECTROSURGICAL) ×1 IMPLANT
FELT TEFLON 1X6 (MISCELLANEOUS) IMPLANT
GAUZE KITTNER 4X8 (MISCELLANEOUS) ×1 IMPLANT
GAUZE SPONGE 4X4 12PLY STRL (GAUZE/BANDAGES/DRESSINGS) IMPLANT
GLOVE SS BIOGEL STRL SZ 7.5 (GLOVE) ×2 IMPLANT
GOWN STRL REUS W/ TWL LRG LVL3 (GOWN DISPOSABLE) ×1 IMPLANT
GOWN STRL REUS W/ TWL XL LVL3 (GOWN DISPOSABLE) ×2 IMPLANT
GOWN STRL REUS W/TWL 2XL LVL3 (GOWN DISPOSABLE) ×1 IMPLANT
GOWN STRL REUS W/TWL LRG LVL3 (GOWN DISPOSABLE) ×3
GOWN STRL REUS W/TWL XL LVL3 (GOWN DISPOSABLE) ×2
HEMOSTAT SURGICEL 2X14 (HEMOSTASIS) ×2 IMPLANT
IRRIGATION STRYKERFLOW (MISCELLANEOUS) ×1 IMPLANT
IRRIGATOR STRYKERFLOW (MISCELLANEOUS) ×1
IV SODIUM CHL 0.9% 500ML (IV SOLUTION) ×1 IMPLANT
KIT SUCTION CATH 14FR (SUCTIONS) IMPLANT
NDL 25GX 5/8IN NON SAFETY (NEEDLE) ×1 IMPLANT
NEEDLE 25GX 5/8IN NON SAFETY (NEEDLE) ×1 IMPLANT
NS IRRIG 1000ML POUR BTL (IV SOLUTION) IMPLANT
PACK CHEST (CUSTOM PROCEDURE TRAY) ×1 IMPLANT
PAD ARMBOARD 7.5X6 YLW CONV (MISCELLANEOUS) ×2 IMPLANT
PAD ELECT DEFIB RADIOL ZOLL (MISCELLANEOUS) IMPLANT
PATTIES SURGICAL 3 X3 (GAUZE/BANDAGES/DRESSINGS) ×1
PATTIES SURGICAL 3X3 (GAUZE/BANDAGES/DRESSINGS) IMPLANT
PORT ACCESS TROCAR AIRSEAL 12 (TROCAR) IMPLANT
SEAL CANN UNIV 5-8 DVNC XI (MISCELLANEOUS) ×3 IMPLANT
SEAL XI 5MM-8MM UNIVERSAL (MISCELLANEOUS) ×3
SEALER SYNCHRO 8 IS4000 DV (MISCELLANEOUS) ×1
SEALER SYNCHRO 8 IS4000 DVNC (MISCELLANEOUS) IMPLANT
SET TRI-LUMEN FLTR TB AIRSEAL (TUBING) ×1 IMPLANT
SPONGE TONSIL 1 RF SGL (DISPOSABLE) ×1 IMPLANT
STAPLER CANNULA SEAL DVNC XI (STAPLE) IMPLANT
STAPLER CANNULA SEAL XI (STAPLE)
SUT SILK  1 MH (SUTURE) ×1
SUT SILK 1 MH (SUTURE) IMPLANT
SUT SILK 2 0 SH (SUTURE) IMPLANT
SUT SILK 2 0 SH CR/8 (SUTURE) IMPLANT
SUT STEEL 6MS V (SUTURE) IMPLANT
SUT STEEL SZ 6 DBL 3X14 BALL (SUTURE) IMPLANT
SUT VIC AB 1 CTX 36 (SUTURE) ×2
SUT VIC AB 1 CTX36XBRD ANBCTR (SUTURE) ×1 IMPLANT
SUT VIC AB 2-0 CTX 36 (SUTURE) ×1 IMPLANT
SUT VIC AB 3-0 X1 27 (SUTURE) ×2 IMPLANT
SUT VICRYL 0 TIES 12 18 (SUTURE) IMPLANT
SUT VICRYL 0 UR6 27IN ABS (SUTURE) ×2 IMPLANT
SYR 20CC LL (SYRINGE) ×2 IMPLANT
SYSTEM RETRIEVAL ANCHOR 8 (MISCELLANEOUS) IMPLANT
SYSTEM SAHARA CHEST DRAIN ATS (WOUND CARE) IMPLANT
TAPE CLOTH SURG 4X10 WHT LF (GAUZE/BANDAGES/DRESSINGS) IMPLANT
TOWEL GREEN STERILE (TOWEL DISPOSABLE) IMPLANT
TOWEL GREEN STERILE FF (TOWEL DISPOSABLE) IMPLANT
TRAY FOLEY SLVR 14FR TEMP STAT (SET/KITS/TRAYS/PACK) IMPLANT
TROCAR PORT AIRSEAL 12X150 (TUBING) IMPLANT
WATER STERILE IRR 1000ML POUR (IV SOLUTION) IMPLANT

## 2022-08-05 NOTE — Transfer of Care (Signed)
Immediate Anesthesia Transfer of Care Note  Patient: Sandra Craig  Procedure(s) Performed: LEFT XI ROBOTIC ASSISTED THORACOSCOPY RESECTION OF ANTERIOR MEDIASTINAL MASS (Left)  Patient Location: PACU  Anesthesia Type:General  Level of Consciousness: drowsy  Airway & Oxygen Therapy: Patient Spontanous Breathing and Patient connected to face mask oxygen  Post-op Assessment: Report given to RN and Post -op Vital signs reviewed and stable  Post vital signs: Reviewed and stable  Last Vitals:  Vitals Value Taken Time  BP 172/81   Temp    Pulse 79 08/05/22 1032  Resp 21 08/05/22 1032  SpO2 98 % 08/05/22 1032  Vitals shown include unvalidated device data.  Last Pain:  Vitals:   08/05/22 0614  TempSrc:   PainSc: 0-No pain      Patients Stated Pain Goal: 0 (62/26/33 3545)  Complications: No notable events documented.

## 2022-08-05 NOTE — Anesthesia Procedure Notes (Signed)
Central Venous Catheter Insertion Performed by: Audry Pili, MD, anesthesiologist Start/End1/11/2022 7:17 AM, 08/05/2022 7:25 AM Patient location: Pre-op. Preanesthetic checklist: patient identified, IV checked, risks and benefits discussed, surgical consent, monitors and equipment checked, pre-op evaluation, timeout performed and anesthesia consent Position: Trendelenburg Lidocaine 1% used for infiltration and patient sedated Hand hygiene performed , maximum sterile barriers used  and Seldinger technique used Catheter size: 8 Fr Central line was placed.Double lumen Procedure performed using ultrasound guided technique. Ultrasound Notes:anatomy identified, needle tip was noted to be adjacent to the nerve/plexus identified, no ultrasound evidence of intravascular and/or intraneural injection and image(s) printed for medical record Attempts: 1 Following insertion, line sutured, dressing applied and Biopatch. Post procedure assessment: blood return through all ports, free fluid flow and no air  Patient tolerated the procedure well with no immediate complications.

## 2022-08-05 NOTE — Hospital Course (Signed)
HPI: Sandra Craig is sent for consultation regarding an anterior mediastinal mass.   Sandra Craig is a an 82 year old woman with a past medical history significant hypertension, hyperlipidemia, MAC, fibromyalgia, endometriosis, hiatal hernia, arthritis, interstitial cystitis, osteopenia, osteoporosis, small bowel obstruction, and TIAs.   She was in her usual state of health until 07/03/2022 when she presented with severe central and right-sided chest pain.  The pain lasted a few hours.  She was admitted.  She was initially started on blood thinners but the CT showed no evidence of a pulmonary embolus.  However, it did show an anterior mediastinal mass.  She ruled out for MI.  Echocardiogram showed normal left-ventricular function with no significant valvular pathology.  She was discharged on day 2.   She denies any fevers, chills, loss of appetite, or weight loss.  No further episodes of the chest pain which led to the diagnosis.  She does have a chronic dry cough and chronic hoarseness.  Dr. Roxan Hockey reviewed the patient's studies and determined surgical intervention would provide the patient the best long term treatment. Dr. Roxan Hockey reviewed the patient's treatment options as well as the risks and benefits of surgery. Sandra Craig was agreeable to proceed with surgery.   Hospital Course: Sandra Craig arrived at [facility] and was brought to the operating room on 08/05/22. She underwent robotic assisted anterior mediastinal mass resection. She tolerated the procedure well and was transferred to the PACU in stable condition.

## 2022-08-05 NOTE — Brief Op Note (Addendum)
08/05/2022  10:21 AM  PATIENT:  Sandra Craig  82 y.o. 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  PROCEDURE:  Procedure(s): LEFT XI ROBOTIC ASSISTED THORACOSCOPY RESECTION OF ANTERIOR MEDIASTINAL MASS (Left)/ THYMECTOMY  SURGEON:  Surgeon(s) and Role: [name], MD - Primary  PHYSICIAN ASSISTANT: Wynelle Beckmann PA-C  ASSISTANTS: none   ANESTHESIA:   local and general  EBL:  20 mL   BLOOD ADMINISTERED:none  DRAINS:  28 F left pleural chest tube    LOCAL MEDICATIONS USED:  OTHER Exparel  SPECIMEN:  Source of Specimen:  Anterior mediastinal mass  DISPOSITION OF SPECIMEN:  PATHOLOGY  COUNTS:  YES  DICTATION: .Dragon Dictation  PLAN OF CARE: Admit to inpatient   PATIENT DISPOSITION:  PACU - hemodynamically stable.   Delay start of Pharmacological VTE agent (>24hrs) due to surgical blood loss or risk of bleeding: no

## 2022-08-05 NOTE — Anesthesia Procedure Notes (Signed)
Arterial Line Insertion Start/End1/11/2022 7:15 AM Performed by: Carolan Clines, CRNA, CRNA  Patient location: Pre-op. Preanesthetic checklist: patient identified, IV checked, site marked, risks and benefits discussed, surgical consent, monitors and equipment checked, pre-op evaluation, timeout performed and anesthesia consent Lidocaine 1% used for infiltration Right, radial was placed Catheter size: 20 G Hand hygiene performed  and maximum sterile barriers used   Attempts: 2 Procedure performed without using ultrasound guided technique. Following insertion, dressing applied and Biopatch. Post procedure assessment: normal and unchanged  Patient tolerated the procedure well with no immediate complications.

## 2022-08-05 NOTE — Discharge Instructions (Signed)
Robot-Assisted Thoracic Surgery, Care After The following information offers guidance on how to care for yourself after your procedure. Your health care provider may also give you more specific instructions. If you have problems or questions, contact your health care provider. What can I expect after the procedure? After the procedure, it is common to have: Some pain and aches in the area of your surgical incisions. Pain when breathing in (inhaling) and coughing. Tiredness (fatigue). Trouble sleeping. Constipation. Follow these instructions at home: Medicines Take over-the-counter and prescription medicines only as told by your health care provider. If you were prescribed an antibiotic medicine, take it as told by your health care provider. Do not stop taking the antibiotic even if you start to feel better. Talk with your health care provider about safe and effective ways to manage pain after your procedure. Pain management should fit your specific health needs. Take pain medicine before pain becomes severe. Relieving and controlling your pain will make breathing easier for you. Ask your health care provider if the medicine prescribed to you requires you to avoid driving or using machinery. Eating and drinking Follow instructions from your health care provider about eating or drinking restrictions. These will vary depending on what procedure you had. Your health care provider may recommend: A liquid diet or soft diet for the first few days. Meals that are smaller and more frequent. A diet of fruits, vegetables, whole grains, and low-fat proteins. Limiting foods that are high in fat and processed sugar, including fried or sweet foods. Incision care Follow instructions from your health care provider about how to take care of your incisions. Make sure you: Wash your hands with soap and water for at least 20 seconds before and after you change your bandage (dressing). If soap and water are not  available, use hand sanitizer. Change your dressing as told by your health care provider. Leave stitches (sutures), skin glue, or adhesive strips in place. These skin closures may need to stay in place for 2 weeks or longer. If adhesive strip edges start to loosen and curl up, you may trim the loose edges. Do not remove adhesive strips completely unless your health care provider tells you to do that. Check your incision area every day for signs of infection. Check for: Redness, swelling, or more pain. Fluid or blood. Warmth. Pus or a bad smell. Activity Return to your normal activities as told by your health care provider. Ask your health care provider what activities are safe for you. Ask your health care provider when it is safe for you to drive. Do not lift anything that is heavier than 10 lb (4.5 kg), or the limit that you are told, until your health care provider says that it is safe. Rest as told by your health care provider. Avoid sitting for a long time without moving. Get up to take short walks every 1-2 hours. This is important to improve blood flow and breathing. Ask for help if you feel weak or unsteady. Do exercises as told by your health care provider. Pneumonia prevention  Do deep breathing exercises and cough regularly as directed. This helps clear mucus and opens your lungs. Doing this helps prevent lung infection (pneumonia). If you were given an incentive spirometer, use it as told. An incentive spirometer is a tool that measures how well you are filling your lungs with each breath. Coughing may hurt less if you try to support your chest. This is called splinting. Try one of these when you  cough: Hold a pillow against your chest. Place the palms of both hands on top of your incision area. Do not use any products that contain nicotine or tobacco. These products include cigarettes, chewing tobacco, and vaping devices, such as e-cigarettes. If you need help quitting, ask your  health care provider. Avoid secondhand smoke. General instructions If you have a drainage tube: Follow instructions from your health care provider about how to take care of it. Do not travel by airplane after your tube is removed until your health care provider tells you it is safe. You may need to take these actions to prevent or treat constipation: Drink enough fluid to keep your urine pale yellow. Take over-the-counter or prescription medicines. Eat foods that are high in fiber, such as beans, whole grains, and fresh fruits and vegetables. Limit foods that are high in fat and processed sugars, such as fried or sweet foods. Keep all follow-up visits. This is important. Contact a health care provider if: You have redness, swelling, or more pain around an incision. You have fluid or blood coming from an incision. An incision feels warm to the touch. You have pus or a bad smell coming from an incision. You have a fever. You cannot eat or drink without vomiting. Your pain medicine is not controlling your pain. Get help right away if: You have chest pain. Your heart is beating quickly. You have trouble breathing. You have trouble speaking. You are confused. You feel weak or dizzy, or you faint. These symptoms may represent a serious problem that is an emergency. Do not wait to see if the symptoms will go away. Get medical help right away. Call your local emergency services (911 in the [place] Do not drive yourself to the hospital. Summary Talk with your health care provider about safe and effective ways to manage pain after your procedure. Pain management should fit your specific health needs. Return to your normal activities as told by your health care provider. Ask your health care provider what activities are safe for you. Do deep breathing exercises and cough regularly as directed. This helps to clear mucus and prevent pneumonia. If it hurts to cough, ease pain by holding a pillow  against your chest or by placing the palms of both hands over your incisions. This information is not intended to replace advice given to you by your health care provider. Make sure you discuss any questions you have with your health care provider. Document Revised: 04/10/2020 Document Reviewed: 04/10/2020 Elsevier Patient Education  [employer].   Discharge Instructions:  1. You may shower, please wash incisions daily with soap and water and keep dry.  If you wish to cover wounds with dressing you may do so but please keep clean and change daily.  No tub baths or swimming until incisions have completely healed.  If your incisions become red or develop any drainage please call our office at [phone]  2. No Driving until cleared by Dr. Leonarda Salon office and you are no longer using narcotic pain medications  3. Monitor your weight daily.. Please use the same scale and weigh at same time... If you gain 5-10 lbs in 48 hours with associated lower extremity swelling, please contact our office at [phone]  4. Fever of 101.5 for at least 24 hours with no source, please contact our office at [phone]  5. Activity- up as tolerated, please walk at least 3 times per day.  Avoid strenuous activity, no lifting, pushing, or pulling with your  arms over 8-10 lbs   6. If any questions or concerns arise, please do not hesitate to contact our office at [phone]

## 2022-08-05 NOTE — Discharge Summary (Signed)
Physician Discharge Summary  Patient ID: Sandra Craig MRN: [id number] DOB/AGE: 1940-09-11 82 y.o.  Admit date: 08/05/2022 Discharge date: 08/07/2022  Admission Diagnoses:  Mediastinal mass Hypertension Stage IIIB CKD  Discharge Diagnoses:   Mediastinal mass (Path pending) S/P robotic-assisted resection of mediastinal mass Hypertension Stage IIIB CKD   Discharged Condition: stable  HPI: [name] is sent for consultation regarding an anterior mediastinal mass.   Sandra Craig is a an 82 year old woman with a past medical history significant hypertension, hyperlipidemia, MAC, fibromyalgia, endometriosis, hiatal hernia, arthritis, interstitial cystitis, osteopenia, osteoporosis, small bowel obstruction, and TIAs.   She was in her usual state of health until 07/03/2022 when she presented with severe central and right-sided chest pain.  The pain lasted a few hours.  She was admitted.  She was initially started on blood thinners but the CT showed no evidence of a pulmonary embolus.  However, it did show an anterior mediastinal mass.  She ruled out for MI.  Echocardiogram showed normal left-ventricular function with no significant valvular pathology.  She was discharged on day 2.   She denies any fevers, chills, loss of appetite, or weight loss.  No further episodes of the chest pain which led to the diagnosis.  She does have a chronic dry cough and chronic hoarseness.  Dr. Roxan Hockey reviewed the patient's studies and determined surgical intervention would provide the patient the best long term treatment. Dr. Roxan Hockey reviewed the patient's treatment options as well as the risks and benefits of surgery. Sandra Craig was agreeable to proceed with surgery.   Hospital Course: [name] arrived at [facility] and was brought to the operating room on 08/05/22. She underwent robotic assisted anterior mediastinal mass resection. She tolerated the procedure well and was  transferred to the PACU in stable condition. Respiratory status and cardiac rhythm remained stable.  There was no air leak from the pleural tube. The tube was removed on the first post-op day and the follow up CXR was satisvactory with no complicating features. Diet and activity were advanced routinely and by the time of discharge, she was tolerating solid and having no difficulty swallowing. She had return of appropriate bowel function and was ambulating independently by the day of discharge.   Consults: None  Significant Diagnostic Studies:  CLINICAL DATA:  Fatigue and mid sternal chest pain   EXAM: CT CHEST WITHOUT CONTRAST   TECHNIQUE: Multidetector CT imaging of the chest was performed following the Craig protocol without IV contrast.   RADIATION DOSE REDUCTION: This exam was performed according to the departmental dose-optimization program which includes automated exposure control, adjustment of the mA and/or kV according to patient size and/or use of iterative reconstruction technique.   COMPARISON:  Prior CT scan of the chest 03/19/2019   FINDINGS: Cardiovascular: Limited evaluation in the absence of intravenous contrast. No aneurysm. Scattered atherosclerotic calcifications throughout the thoracic aorta. The heart is normal in size. No pericardial effusion.   Mediastinum/Nodes: Significant interval enlargement of prevascular lymph node which now nearly fills the anterior mediastinum measuring approximately 4.5 x 4.0 cm compared to a maximum 1.6 cm previously. The remaining mediastinum appears unremarkable.   Lungs/Pleura: Similar cluster of tree in bud nodularity and focal bronchiectasis in the right upper lobe. Bronchiectasis has progressed in there is more linear pleuroparenchymal scarring than prior. Additional foci of tree-in-bud micro nodularity are also present within the medial aspect of the lingula and throughout the periphery of left upper lobe. Findings are  slightly progressed compared to prior.  Mild dependent atelectasis. Trace bilateral pleural effusions.   Upper Abdomen: No acute abnormality within the upper abdomen. Circumscribed water attenuation simple cyst exophytic from the anterior aspect of the left kidney is similar compared to prior imaging. The gallbladder is surgically absent.   Musculoskeletal: No acute fracture or aggressive appearing lytic or blastic osseous lesion.   IMPRESSION: 1. Significant interval enlargement of prevascular lymph node which now fills the anterior mediastinum and measures 4.5 x 4.0 cm compared to a maximum of 1.6 cm on prior imaging from August of 2020. Differential considerations include thymoma, lymphoma, teratoma, thyroid neoplasm and other metastatic disease. 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 2. 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 3. Aortic atherosclerotic vascular calcifications. 4. Trace bilateral pleural effusions and associated bibasilar atelectasis.   Aortic Atherosclerosis (ICD10-I70.0).     Electronically Signed   By: Jacqulynn Cadet M.D.   On: 07/03/2022 15:14  Treatments: Surgery  Operative Report    DATE OF PROCEDURE: 08/05/2022   PREOPERATIVE DIAGNOSIS:  Anterior mediastinal mass.   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   PROCEDURE:  Xi robotic-assisted left thoracoscopy for thymectomy/resection of anterior mediastinal mass.   SURGEON:  Sandra Craig. Roxan Hockey, MD   ASSISTANT:  Wynelle Beckmann, PA   ANESTHESIA:  General.   FINDINGS:  Mass in anterior mediastinum.  No gross invasion of adjacent tissue planes.  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   CLINICAL NOTE:  The patient is an 82 year old woman who presented with a chest pain.  She  ruled out for myocardial infarction and pulmonary embolus, but worked up showed an anterior mediastinal mass.  She then had a PET/CT, which showed the mass was  hypermetabolic with central necrosis.  She was advised to undergo surgical resection.  The indications, risks, benefits, and alternatives were discussed in detail with the patient.  She understood and accepted the risks and agreed to proceed.  PATHOLOGY: Pending at time of discharge  Discharge Exam: Blood pressure (!) 94/56, pulse (!) 104, temperature 97.9 F (36.6 C), temperature source Oral, resp. rate 20, height _0  (1.626 m), weight 58.7 kg, last menstrual period 08/25/1992, SpO2 91 %.  General appearance: alert, cooperative, and no distress Neurologic: intact Heart: RRR Lungs: breath sounds are clear.  CXR stable.  Abdomen: soft and NT Wound: the port incisions and CT exit sites are dry..   Disposition:    Allergies as of 08/07/2022       Reactions   Azithromycin Rash   Pruritic rash diffuse    Celecoxib Nausea Only        Medication List     TAKE these medications    acetaminophen 500 MG tablet Commonly known as: TYLENOL Take 1,000 mg by mouth every 6 (six) hours as needed for moderate pain.   amLODipine 2.5 MG tablet Commonly known as: NORVASC Take 2.5 mg by mouth daily.   aspirin 81 MG tablet Take 1 tablet (81 mg total) by mouth daily.   atorvastatin 40 MG tablet Commonly known as: LIPITOR Take 1 tablet (40 mg total) by mouth daily.   CALCIUM + D PO Take 1 tablet by mouth 2 (two) times daily.   DULoxetine 30 MG capsule Commonly known as: CYMBALTA TAKE ONE CAPSULE BY MOUTH TWICE DAILY   famotidine 20 MG tablet Commonly known as: PEPCID TAKE 1 TABLET(20 MG) BY  MOUTH TWICE DAILY   gabapentin 300 MG capsule Commonly known as: NEURONTIN TAKE 1 CAPSULE(300 MG) BY MOUTH TWICE DAILY AS NEEDED FOR PAIN What changed: See the new instructions.   losartan 25 MG tablet Commonly known as:  COZAAR Take 12.5 mg by mouth daily.   multivitamin with minerals tablet Take 1 tablet by mouth daily.   oxyCODONE 5 MG immediate release tablet Commonly known as: Oxy IR/ROXICODONE Take 1 tablet (5 mg total) by mouth every 6 (six) hours as needed for up to 3 days for moderate pain.   Polyethyl Glycol-Propyl Glycol 0.4-0.3 % Gel ophthalmic gel Commonly known as: SYSTANE Place 1 application into both eyes 3 (three) times daily as needed (dry eye).   zolpidem 5 MG tablet Commonly known as: AMBIEN TAKE 1 TABLET BY MOUTH EVERY DAY AT BEDTIME FOR SLEEP What changed: See the new instructions.        Follow-up Information     [name], MD. Go on 08/16/2022.   Specialty: Cardiothoracic Surgery Why: Your appt is at 12:45pm. Please obtain a chest x-ray 1 hour prior to the appintment at [facility]. Contact information: [address] [place] [address] [phone]         [place]. Go on 08/16/2022.   Why: Please obtain a chest x-ray at 11:45am. Contact information: [place] [address]                Signed: Antony Odea, PA-C  08/07/2022, 9:04 AM

## 2022-08-05 NOTE — Op Note (Signed)
NAME: Sandra Craig, HERRERA MEDICAL RECORD NO: [id number] ACCOUNT NO: [id number] DATE OF BIRTH: 1940/12/21 FACILITY: [facility] LOCATION: [facility] PHYSICIAN: Revonda Standard. Roxan Hockey, MD  Operative Report   DATE OF PROCEDURE: 08/05/2022  PREOPERATIVE DIAGNOSIS:  Anterior mediastinal mass.  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  PROCEDURE:   Xi robotic-assisted left thoracoscopy for thymectomy/resection of anterior mediastinal mass.  SURGEON:  Revonda Standard. Roxan Hockey, MD  ASSISTANT:  Wynelle Beckmann, PA  ANESTHESIA:  General.  FINDINGS:  Mass in anterior mediastinum.  No gross invasion of adjacent tissue planes.  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  CLINICAL NOTE:  Sandra Craig is an 82 year old woman who presented with a chest pain.  She ruled out for myocardial infarction and pulmonary embolus, but work up showed an anterior mediastinal mass.  She then had a PET/CT, which showed the mass was hypermetabolic with central necrosis.  She was advised to undergo surgical resection.  The indications, risks, benefits, and alternatives were discussed in detail with the patient.  She understood and accepted the risks and agreed to proceed.  OPERATIVE NOTE:  Sandra Craig was brought to the preoperative holding area on 08/05/2022.  The anesthesia service under direction of Dr. Renold Don, placed an arterial blood pressure monitoring line and a central venous catheter.  She was taken to the  operating room and anesthetized and intubated with a double lumen endotracheal tube.  Intravenous antibiotics were administered.  A Foley catheter was placed.  Sequential compression devices were placed on the calves for DVT prophylaxis.  She was left in a supine position with the left chest elevated slightly.  A Bair Hugger was placed for active warming.  The left chest was prepped and draped in the usual sterile fashion.  Single lung ventilation of the right lung was  initiated and was tolerated well throughout the procedure.  A timeout was performed.  A solution was prepared containing 20 mL of liposomal bupivacaine and 30 mL of 0.5% bupivacaine.  An incision was made in the mid portion of the chest laterally and an 8 mm robotic port was inserted.  Thoracoscope was advanced into the chest.  After confirming intrapleural placement, carbon dioxide was insufflated per protocol.  Additional ports were placed along the anterior axillary line in the third interspace and then the eighth interspace.  A 12 mm AirSeal port was placed  in the seventh interspace in the posterior axillary line.  The robot was deployed.  The camera arm was docked.  The remaining arms were docked and the robotic instruments were inserted with thoracoscopic visualization.  Inspection showed thin filmy adhesions of the lung to the anterior mediastinum.  These were taken down with cautery and blunt dissection.  The mass was clearly visible in the anterior superior mediastinum.  The phrenic nerve was identified and there was no evidence of invasion of the phrenic nerve.  Initially, a plane was developed anteriorly.  The pleura was incised with bipolar cautery and a plane was developed working from left to right.  The inferior aspect of the thymic fat pad was identified and plane was developed in that area and dissection continued from an inferior approach along the pericardium.  There was no evidence of invasion of the pericardium.  The posterior boundary of the thymic fat pad was just anterior to the phrenic nerve and this plane was developed.  Again, bipolar cautery was used to coagulate vessels.  Care was taken not to place excessive traction on the phrenic nerve. Superiorly  there was some nodal tissue adjacent to the thymus, which was removed with the specimen.  The left superior pole of the thymus was mobilized and the vessels were cauterized.  Then, working along the inferior edge of the innominate vein  the SynchroSeal device was used to divide the large venous branches feeding the thymic fatty tissue.  The right superior pole was mobilized and then once again bipolar cautery was used to mobilize the rest of the mass off of the anterior pericardium.  The sponge that had been placed during the dissection was removed.  An 8 mm endoscopic retrieval bag was placed through the AirSeal port and the mass was manipulated into the bag.  The specimen was removed and was sent for frozen section of the mass.  Sutures were placed to orient the specimen.  The operative site was inspected.  There was no residual tumor seen, the phrenic nerve, pericardium and innominate vein were all clearly visible as was the right sided pleura.  A 19-French Blake drain was placed through the inferior incision and directed to the apex.  It was secured with a #1 silk suture.  Dual lung ventilation was resumed.  The remaining incisions were closed in standard fashion.  Dermabond was applied.  Chest tube was placed to a Pleur-Evac on waterseal.  The patient was extubated in the operating room and taken to the postanesthetic care unit in good condition.  All  sponge, needle and instrument counts were correct at the end of the procedure.  Experienced assistance was necessary for this case due to surgical complexity.  Aloha Gell, PA served as the Geophysicist/field seismologist providing assistance with port placement, robot docking and undocking, instrument exchange and specimen retrieval, and wound  closure.   SHY D: 08/05/2022 4:58:12 pm T: 08/05/2022 8:40:00 pm  JOB: [id number]/ [id number]

## 2022-08-05 NOTE — Interval H&P Note (Signed)
History and Physical Interval Note:  08/05/2022 7:17 AM  Sandra Craig  has presented today for surgery, with the diagnosis of anterior mediastinal mass.  The various methods of treatment have been discussed with the patient and family. After consideration of risks, benefits and other options for treatment, the patient has consented to  Procedure(s): LEFT XI ROBOTIC ASSISTED THORACOSCOPY RESECTION OF ANTERIOR MEDIASTINAL MASS (Left) as a surgical intervention.  The patient's history has been reviewed, patient examined, no change in status, stable for surgery.  I have reviewed the patient's chart and labs.  Questions were answered to the patient's satisfaction.     [name]

## 2022-08-05 NOTE — Anesthesia Postprocedure Evaluation (Signed)
Anesthesia Post Note  Patient: Sandra Craig  Procedure(s) Performed: LEFT XI ROBOTIC ASSISTED THORACOSCOPY RESECTION OF ANTERIOR MEDIASTINAL MASS (Left)     Patient location during evaluation: PACU Anesthesia Type: General Level of consciousness: awake and alert Pain management: pain level controlled Vital Signs Assessment: post-procedure vital signs reviewed and stable Respiratory status: spontaneous breathing, nonlabored ventilation, respiratory function stable and patient connected to nasal cannula oxygen Cardiovascular status: blood pressure returned to baseline and stable Postop Assessment: no apparent nausea or vomiting Anesthetic complications: no   No notable events documented.  Last Vitals:  Vitals:   08/05/22 1226 08/05/22 1240  BP: (!) 164/81   Pulse: 94 97  Resp: 14 20  Temp: 36.7 C   SpO2: 98% 100%                    Audry Pili

## 2022-08-05 NOTE — Anesthesia Procedure Notes (Signed)
Procedure Name: Intubation Date/Time: 08/05/2022 7:50 AM  Performed by: Carolan Clines, CRNAPre-anesthesia Checklist: Patient identified, Emergency Drugs available, Suction available and Patient being monitored Patient Re-evaluated:Patient Re-evaluated prior to induction Oxygen Delivery Method: Circle System Utilized Preoxygenation: Pre-oxygenation with 100% oxygen Induction Type: IV induction Ventilation: Mask ventilation without difficulty Laryngoscope Size: Mac and 3 Grade View: Grade I Tube type: Oral Endobronchial tube: Left and Double lumen EBT and 35 Fr Number of attempts: 1 Airway Equipment and Method: Stylet Placement Confirmation: ETT inserted through vocal cords under direct vision, positive ETCO2 and breath sounds checked- equal and bilateral Tube secured with: Tape Dental Injury: Teeth and Oropharynx as per pre-operative assessment  Comments: 35 Fr VivaSight DLT

## 2022-08-06 ENCOUNTER — Inpatient Hospital Stay (HOSPITAL_COMMUNITY): Payer: Medicare Other

## 2022-08-06 LAB — CBC
HCT: 33.6 % — ABNORMAL LOW (ref 36.0–46.0)
Hemoglobin: 10.6 g/dL — ABNORMAL LOW (ref 12.0–15.0)
MCH: 29.2 pg (ref 26.0–34.0)
MCHC: 31.5 g/dL (ref 30.0–36.0)
MCV: 92.6 fL (ref 80.0–100.0)
Platelets: 151 10*3/uL (ref 150–400)
RBC: 3.63 MIL/uL — ABNORMAL LOW (ref 3.87–5.11)
RDW: 14.4 % (ref 11.5–15.5)
WBC: 10.7 10*3/uL — ABNORMAL HIGH (ref 4.0–10.5)
nRBC: 0 % (ref 0.0–0.2)

## 2022-08-06 LAB — BASIC METABOLIC PANEL
Anion gap: 5 (ref 5–15)
BUN: 22 mg/dL (ref 8–23)
CO2: 26 mmol/L (ref 22–32)
Calcium: 8 mg/dL — ABNORMAL LOW (ref 8.9–10.3)
Chloride: 106 mmol/L (ref 98–111)
Creatinine, Ser: 1.68 mg/dL — ABNORMAL HIGH (ref 0.44–1.00)
GFR, Estimated: 31 mL/min — ABNORMAL LOW (ref >60)
Glucose, Bld: 122 mg/dL — ABNORMAL HIGH (ref 70–99)
Potassium: 4.5 mmol/L (ref 3.5–5.1)
Sodium: 137 mmol/L (ref 135–145)

## 2022-08-06 NOTE — Progress Notes (Addendum)
      [address]       [place] [address]             [phone]      1 Day Post-Op Procedure(s) (LRB): LEFT XI ROBOTIC ASSISTED THORACOSCOPY RESECTION OF ANTERIOR MEDIASTINAL MASS (Left) Subjective: Awake and alert, finished breakfast. Says she is reasonably comfortable. Taking tylenol and has had one dose of Tramadol.  Denies shortness of breath.   Objective: Vital signs in last 24 hours: Temp:  [96.6 F (35.9 C)-98.4 F (36.9 C)] 97.8 F (36.6 C) (01/06 0813) Pulse Rate:  [76-99] 89 (01/06 0813) Cardiac Rhythm: Normal sinus rhythm (01/06 0700) Resp:  [11-22] 17 (01/06 0813) BP: (115-164)/(62-133) 115/62 (01/06 0813) SpO2:  [91 %-100 %] 99 % (01/06 0813) Arterial Line BP: (166-209)/(73-88) 209/82 (01/05 1130)    Intake/Output from previous day: 01/05 0701 - 01/06 0700 In: 2900.4 [P.O.:685; I.V.:1815.4] Out: 2305 [Urine:2185; Blood:20; Chest Tube:100] Intake/Output this shift: Total I/O In: 240 [P.O.:240] Out: -   General appearance: alert, cooperative, and no distress Neurologic: intact Heart: RRR Lungs: breath sounds are coarse. No air leak and minimal drainage from the pleural tube. The CXR shows full expansion of both lungs. CT and TLC well positioned.  Abdomen: soft and NT Wound: the port incisions are dry.   Lab Results: Recent Labs    08/03/22 1000 08/05/22 0844 08/06/22 0310  WBC 5.7  --  10.7*  HGB 11.6* 10.2* 10.6*  HCT 35.9* 30.0* 33.6*  PLT 153  --  151   BMET:  Recent Labs    08/03/22 1000 08/05/22 0844 08/06/22 0310  NA 139 140 137  K 4.3 3.9 4.5  CL 105  --  106  CO2 22  --  26  GLUCOSE 168*  --  122*  BUN 29*  --  22  CREATININE 1.76*  --  1.68*  CALCIUM 8.9  --  8.0*    PT/INR:  Recent Labs    08/03/22 1000  LABPROT 13.8  INR 1.1   ABG    Component Value Date/Time   PHART 7.334 (L) 08/05/2022 0844   HCO3 23.5 08/05/2022 0844   TCO2 25 08/05/2022 0844   ACIDBASEDEF 2.0 08/05/2022 0844   O2SAT 99  08/05/2022 0844   CBG (last 3)  No results for input(s): "GLUCAP" in the last 72 hours.  Assessment/Plan: S/P Procedure(s) (LRB): LEFT XI ROBOTIC ASSISTED THORACOSCOPY RESECTION OF ANTERIOR MEDIASTINAL MASS (Left)  -POD 1 robotic-assisted resection of mediastinal mass suspicious for thymic cancer. Path pending. Stable respiratory status and tolerating PO's with no difficulty. Minimal drainage from the chest drain and no air leak. Will remove the chest tube today. F/U CXR in AM.    -Expected acute blood loss anemia- minimal drop since surgery, monitor.  -GI- tolerating PO's, will d/c the IVF.   -PULM- normal WOB on 2L [place] O2. Mobilize and work on [employer].   -RENAL- stage IIIB CKD- creat near baseline. Monitor.   -Disposition- Pt. lives with her husband who she said will be limited in his ability to assist her after discharge due to severe neuropathy.      LOS: 1 day    Antony Odea, [place] [phone] 08/06/2022   Chart reviewed, patient examined, agree with above. Chest tube just removed. Will repeat CXR in am and plan home tomorrow.

## 2022-08-07 ENCOUNTER — Inpatient Hospital Stay (HOSPITAL_COMMUNITY): Payer: Medicare Other

## 2022-08-07 LAB — COMPREHENSIVE METABOLIC PANEL
ALT: 26 U/L (ref 0–44)
AST: 29 U/L (ref 15–41)
Albumin: 2.7 g/dL — ABNORMAL LOW (ref 3.5–5.0)
Alkaline Phosphatase: 65 U/L (ref 38–126)
Anion gap: 5 (ref 5–15)
BUN: 26 mg/dL — ABNORMAL HIGH (ref 8–23)
CO2: 24 mmol/L (ref 22–32)
Calcium: 7.5 mg/dL — ABNORMAL LOW (ref 8.9–10.3)
Chloride: 104 mmol/L (ref 98–111)
Creatinine, Ser: 1.95 mg/dL — ABNORMAL HIGH (ref 0.44–1.00)
GFR, Estimated: 26 mL/min — ABNORMAL LOW (ref >60)
Glucose, Bld: 109 mg/dL — ABNORMAL HIGH (ref 70–99)
Potassium: 3.8 mmol/L (ref 3.5–5.1)
Sodium: 133 mmol/L — ABNORMAL LOW (ref 135–145)
Total Bilirubin: 0.5 mg/dL (ref 0.3–1.2)
Total Protein: 5.6 g/dL — ABNORMAL LOW (ref 6.5–8.1)

## 2022-08-07 LAB — CBC
HCT: 32.9 % — ABNORMAL LOW (ref 36.0–46.0)
Hemoglobin: 9.8 g/dL — ABNORMAL LOW (ref 12.0–15.0)
MCH: 28.9 pg (ref 26.0–34.0)
MCHC: 29.8 g/dL — ABNORMAL LOW (ref 30.0–36.0)
MCV: 97.1 fL (ref 80.0–100.0)
Platelets: 145 10*3/uL — ABNORMAL LOW (ref 150–400)
RBC: 3.39 MIL/uL — ABNORMAL LOW (ref 3.87–5.11)
RDW: 15.1 % (ref 11.5–15.5)
WBC: 8.3 10*3/uL (ref 4.0–10.5)
nRBC: 0 % (ref 0.0–0.2)

## 2022-08-07 LAB — GLUCOSE, CAPILLARY: Glucose-Capillary: 121 mg/dL — ABNORMAL HIGH (ref 70–99)

## 2022-08-07 MED ORDER — OXYCODONE HCL 5 MG PO TABS: 5.0000 mg | ORAL_TABLET | Freq: Four times a day (QID) | ORAL | 0 refills | Status: AC | PRN

## 2022-08-07 NOTE — Progress Notes (Signed)
      [address]       [place], [address]             [phone]      2 Days Post-Op Procedure(s) (LRB): LEFT XI ROBOTIC ASSISTED THORACOSCOPY RESECTION OF ANTERIOR MEDIASTINAL MASS (Left) Subjective: Awake and alert, finished breakfast. Says she feels good and would like to return home today. Having minimal pain managed with Tylenol. BM yesterday.   Objective: Vital signs in last 24 hours: Temp:  [97.6 F (36.4 C)-98.6 F (37 C)] 97.9 F (36.6 C) (01/07 0721) Pulse Rate:  [80-104] 104 (01/07 0721) Cardiac Rhythm: Normal sinus rhythm (01/07 0700) Resp:  [12-20] 20 (01/07 0721) BP: (94-100)/(47-65) 94/56 (01/07 0721) SpO2:  [91 %-100 %] 91 % (01/07 0721) Weight:  [58.7 kg] 58.7 kg (01/07 0654)    Intake/Output from previous day: 01/06 0701 - 01/07 0700 In: 240 [P.O.:240] Out: 2 [Urine:1; Stool:1] Intake/Output this shift: No intake/output data recorded.  General appearance: alert, cooperative, and no distress Neurologic: intact Heart: RRR Lungs: breath sounds are clear.  CXR stable.  Abdomen: soft and NT Wound: the port incisions and CT exit sites are dry..   Lab Results: Recent Labs    08/06/22 0310 08/07/22 0459  WBC 10.7* 8.3  HGB 10.6* 9.8*  HCT 33.6* 32.9*  PLT 151 145*    BMET:  Recent Labs    08/06/22 0310 08/07/22 0459  NA 137 133*  K 4.5 3.8  CL 106 104  CO2 26 24  GLUCOSE 122* 109*  BUN 22 26*  CREATININE 1.68* 1.95*  CALCIUM 8.0* 7.5*     PT/INR:  No results for input(s): "LABPROT", "INR" in the last 72 hours.  ABG    Component Value Date/Time   PHART 7.334 (L) 08/05/2022 0844   HCO3 23.5 08/05/2022 0844   TCO2 25 08/05/2022 0844   ACIDBASEDEF 2.0 08/05/2022 0844   O2SAT 99 08/05/2022 0844   CBG (last 3)  Recent Labs    08/07/22 0441  GLUCAP 121*    Assessment/Plan: S/P Procedure(s) (LRB): LEFT XI ROBOTIC ASSISTED THORACOSCOPY RESECTION OF ANTERIOR MEDIASTINAL MASS (Left)  -POD 2 robotic-assisted  resection of mediastinal mass suspicious for thymic cancer. Path pending. Stable respiratory status and tolerating PO's with no difficulty. CXR is stable post CT removal.  -Expected acute blood loss anemia- stable  -GI- tolerating PO's, no difficulty swallowing, BM yesterday  -PULM- normal WOB on RA.  O2 sat 97%  -RENAL- stage IIIB CKD- creat near baseline.   -Disposition- Plan for discharge today. She said her son and husband would be assisting her at home.      LOS: 2 days    Antony Odea, PA-C [phone] 08/07/2022

## 2022-08-08 ENCOUNTER — Telehealth: Payer: Self-pay | Admitting: Family Medicine

## 2022-08-08 ENCOUNTER — Telehealth: Payer: Self-pay

## 2022-08-08 LAB — TYPE AND SCREEN
ABO/RH(D): O NEG
Antibody Screen: NEGATIVE
Unit division: 0

## 2022-08-08 LAB — BPAM RBC
Blood Product Expiration Date: 202401142359
Blood Product Expiration Date: 202401192359
Unit Type and Rh: 9500
Unit Type and Rh: 9500

## 2022-08-08 NOTE — Telephone Encounter (Signed)
See below

## 2022-08-08 NOTE — Patient Outreach (Signed)
  Care Coordination [facility] Note Transition Care Management Unsuccessful Follow-up Telephone Call  Date of discharge and from where:  08/07/22  Attempts:  1st Attempt  Reason for unsuccessful TCM follow-up call:  Left voice message     [facility] Management Telephonic Care Management Coordinator Direct Phone: [phone] Toll Free: [phone] Fax: [phone]

## 2022-08-08 NOTE — Telephone Encounter (Signed)
Patient states: - Admitted to hospital for mediastinal mass on 01/05; discharged on 01/07 - Instructed to follow up with PCP in a week.   Can patient be scheduled to see PCP on 08/15/22 @ 4:20pm?

## 2022-08-08 NOTE — Telephone Encounter (Signed)
There is a 4:00 virtual slot-can place her in that

## 2022-08-08 NOTE — Patient Outreach (Signed)
  Care Coordination TOC Note Transition Care Management Follow-up Telephone Call Date of discharge and from where: 08/07/22-[facility]  Dx; "mediastinal mass s/p resection" How have you been since you were released from the hospital? Incoming call from patient returning RN CM call. She voices that she "still having some pain" which she knows is normal and to be expected. She reports she is taking Tylenol q6hrs to help manage pain.She has Oxycodone in the home but has not used it as she does not like the stronger pain medicines. She voices that she has been having some diarrhea. She had more than 4 loose stools within the last 2hrs. She will try some Imodium and sxs does not resolve-advised to alert MD. She voiced understanding. She states bandages are clean,dry and intact.  Any questions or concerns? No  Items Reviewed: Did the pt receive and understand the discharge instructions provided? Yes  Medications obtained and verified? Yes  Other? Yes  Any new allergies since your discharge? No  Dietary orders reviewed? Yes Do you have support at home? Yes -spouse  Home Care and Equipment/Supplies: Were home health services ordered? not applicable If so, what is the name of the agency? N/A  Has the agency set up a time to come to the patient's home? not applicable Were any new equipment or medical supplies ordered?  No What is the name of the medical supply agency? N/A Were you able to get the supplies/equipment? not applicable Do you have any questions related to the use of the equipment or supplies? No  Functional Questionnaire: (I = Independent and D = Dependent) ADLs: A  Bathing/Dressing- A  Meal Prep- A  Eating- I  Maintaining continence- I  Transferring/Ambulation- I  Managing Meds- I  Follow up appointments reviewed:  PCP Hospital f/u appt confirmed?  N/A  . [facility] f/u appt confirmed? Yes  Scheduled to see Dr. Roxan Hockey on 08/16/22 @ 12:45pm. Are  transportation arrangements needed? No  If their condition worsens, is the pt aware to call PCP or go to the Emergency Dept.? Yes Was the patient provided with contact information for the PCP's office or ED? Yes Was to pt encouraged to call back with questions or concerns? Yes  SDOH assessments and interventions completed:   Yes SDOH Interventions Today    Flowsheet Row Most Recent Value  SDOH Interventions   Food Insecurity Interventions Intervention Not Indicated  Transportation Interventions Intervention Not Indicated       Care Coordination Interventions:  Education provided    Encounter Outcome:  Pt. Visit Completed    Enzo Montgomery, RN,BSN,CCM [facility] Telephonic Care Management Coordinator Direct Phone: [phone] Toll Free: [phone] Fax: [phone]

## 2022-08-09 ENCOUNTER — Telehealth: Payer: Self-pay | Admitting: Family Medicine

## 2022-08-09 NOTE — Telephone Encounter (Signed)
Please call back that office for pt. She hung up too quickly to get information.

## 2022-08-09 NOTE — Telephone Encounter (Signed)
FYI, I returned Christina's call and she states pt has voiced confusion about Amlodpine and Losartan. States her nephrologist does not want her on these meds. Margreta Journey states she thinks pt would benefit from [facility] to help her and husband with medication administration to cut down on confusion.I made Margreta Journey aware that pt has an appointment on 08/15/22 and this will be discussed in further detail at the appointment.

## 2022-08-09 NOTE — Telephone Encounter (Signed)
I am ok with her doing what she believes her nephrologist believes is best, bringing all meds to visit, and bringing blood pressure readings up until visit with her. Please let her know

## 2022-08-09 NOTE — Telephone Encounter (Signed)
Vml for pt to call back and confirm 08/15/22 at 4 pm with hunter

## 2022-08-12 ENCOUNTER — Telehealth: Payer: Self-pay

## 2022-08-12 NOTE — Telephone Encounter (Signed)
Patient contacted the office yesterday evening concerned about loose stools she has been having since she has been home from the hospital. She is s/p RATS mediastinal mass resection 08/05/22 with Dr. Dorris Fetch. She states that she is concerned because it is dark in color. She states that she is making sure to maintain good hydration. Advised that she should contact her PCP first thing in the morning for f/u and evaluation of symptoms. She acknowledged receipt.

## 2022-08-15 ENCOUNTER — Other Ambulatory Visit: Payer: Self-pay | Admitting: Thoracic Surgery (Cardiothoracic Vascular Surgery)

## 2022-08-15 ENCOUNTER — Ambulatory Visit (INDEPENDENT_AMBULATORY_CARE_PROVIDER_SITE_OTHER): Payer: Medicare Other | Admitting: Family Medicine

## 2022-08-15 ENCOUNTER — Encounter: Payer: Self-pay | Admitting: Family Medicine

## 2022-08-15 VITALS — BP 140/85 | HR 78 | Temp 98.1°F | Ht 64.0 in | Wt 146.4 lb

## 2022-08-15 DIAGNOSIS — E785 Hyperlipidemia, unspecified: Secondary | ICD-10-CM

## 2022-08-15 DIAGNOSIS — R911 Solitary pulmonary nodule: Secondary | ICD-10-CM

## 2022-08-15 DIAGNOSIS — I10 Essential (primary) hypertension: Secondary | ICD-10-CM

## 2022-08-15 DIAGNOSIS — J9859 Other diseases of mediastinum, not elsewhere classified: Secondary | ICD-10-CM | POA: Diagnosis not present

## 2022-08-15 NOTE — Patient Instructions (Addendum)
Tdap if you get a cut or scrape- can go ahead and get at pharmacy  Stay with cymbalta 30 mg twice a day- in future we may need to go down to 30 depending on kidneys  Recommended follow up: Return for next already scheduled visit or sooner if needed.

## 2022-08-15 NOTE — Progress Notes (Signed)
Phone [phone] In person visit   Subjective:   Sandra Craig is a 82 y.o. year old very pleasant female patient who presents for/with See problem oriented charting Chief Complaint  Patient presents with   Hospitalization Follow-up    Pt has a couple questions and concerns    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    Priority: High   Chronic low back pain 12/20/2013    Priority: High   Fibromyalgia 12/05/2007    Priority: High   UTI (urinary tract infection) 12/15/2018    Priority: Medium    CKD (chronic kidney disease), stage IIIb 06/02/2017    Priority: Medium    Near syncope 03/11/2017    Priority: Medium    Hyperglycemia 06/27/2012    Priority: Medium    History of small bowel obstruction 06/28/2011    Priority: Medium    GERD with stricture 06/28/2011    Priority: Medium    Polycystic kidney disease     Priority: Medium    Hypertension     Priority: Medium    Hyperlipidemia     Priority: Medium    Osteoporosis 12/21/2009    Priority: Medium    INSOMNIA, CHRONIC 10/08/2008    Priority: Medium    Depression 02/08/2008    Priority: Medium    Facet arthropathy 09/14/2019    Priority: Low   Vaginal dryness 01/29/2019    Priority: Low   Chronic right shoulder pain 03/07/2014    Priority: Low   Benign essential tremor 10/09/2013    Priority: Low   Diverticulitis large intestine 02/11/2012    Priority: Low   Irritable bowel syndrome (IBS) 06/28/2011    Priority: Low   IC (interstitial cystitis)     Priority: Low   Hemorrhoids 10/08/2008    Priority: Low   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     aspirin 81 MG tablet Take 1 tablet (81 mg total) by mouth daily. 30 tablet    atorvastatin (LIPITOR) 40 MG tablet Take 1 tablet (40 mg total) by mouth daily. 90 tablet 3   Calcium Citrate-Vitamin D (CALCIUM + D PO) Take 1 tablet by mouth 2 (two) times daily.     DULoxetine (CYMBALTA) 30 MG capsule TAKE ONE CAPSULE BY MOUTH TWICE DAILY 180 capsule 3   famotidine (PEPCID) 20 MG tablet TAKE 1 TABLET(20 MG) BY MOUTH TWICE DAILY 180 tablet 1   gabapentin (NEURONTIN) 300 MG capsule TAKE 1 CAPSULE(300 MG) BY MOUTH TWICE DAILY AS NEEDED FOR PAIN (Patient taking differently: Take 300 mg by mouth 2 (two) times daily.) 180 capsule 3   losartan (COZAAR) 25 MG tablet Take 12.5 mg by mouth daily.     Multiple Vitamins-Minerals (MULTIVITAMIN WITH MINERALS) tablet Take 1 tablet by mouth daily.     Polyethyl Glycol-Propyl Glycol (SYSTANE) 0.4-0.3 % GEL ophthalmic  gel Place 1 application into both eyes 3 (three) times daily as needed (dry eye).     zolpidem (AMBIEN) 5 MG tablet TAKE 1 TABLET BY MOUTH EVERY DAY AT BEDTIME FOR SLEEP (Patient taking differently: Take 5 mg by mouth at bedtime.) 90 tablet 1   No current facility-administered medications for this visit.     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 Patient was hospitalized from August 05, 2022 to August 07, 2022. A TCM phone call was completed on August 08, 2022 but visit did not meet full criteria for TCM today.  On initial presentation patient was started on blood thinners but CT showed no evidence of pulmonary embolism but did ultimately show a mediastinal mass listed above.  Myocardial infarction was ruled out.  Echocardiogram showed normal ejection fraction with no significant valvular pathology.  Cardiothoracic surgery was consulted and Dr. August 10, 2022 recommended surgery to resect the mass.  Patient did require a chest tube.  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 Dorris Fetch tomorrow - I offered urgent referral to oncology but patient prefers for this to be placed by Dr. Dorris Fetch tomorrow (I agreed has a higher probability of being scheduled quickly) - She is recovering well from surgery - We discussed there could be further follow-up steps like radiation or chemotherapy-would defer any discussion on surgery to Dr. Dorris Fetch though likely less likely - We discussed 1 possible lymph node that was affected but for that were not -She had the support of her husband Dorris Fetch today -Nodules were noted in 2020 but it was thought this was related to MAI per pulmonology.  Previously had PET scan scheduled but was thought low yield by pulmonology due to infection causing hypermetabolism as well potentially.  Cytology at that time on brushings  negative for malignancy and she was positive for MAI.  Plan was to refer to infectious disease by pulmonology but patient states was not called.  The next time we discussed her lung function she reported improvement in symptoms and wanted to monitor off therapy  # Depression/insomnia #fibromyalgia- also on gabapentin 300 mg twice daily S: Medication: Cymbalta 30 mg twice daily prescription but has been taking 60 mg in the morning and 30 mg in the evening, Ambien 5 mg causes vivid dreams at times but does help her sleep -Failed Rozerem in the past with Dr. 2021 Counseling: Working with Artist Pais, LCSW in the past A/P: Patient reports reasonable control though information today is quite stressful understandably so.  Apparently she had been taking  Cymbalta 60 mg in the morning and 30 mg in the evening recommended 30 mg twice daily only due to GFR/creatinine clearance which is frequently around or below 30 - Gabapentin 300 mg twice daily should be reasonable for her fibromyalgia which she reports is overall stable  #hypertension S: medication: Losartan 12.5 mg restarted by Dr. Charline Bills presyncope but was having elevated microalbumin to creatinine ratio compared to prior - Amlodipine 1.25 mg before bed in the past-stopped with presyncope/syncope issues Home readings #s: Typically 140s over 80s A/P: Blood pressure slightly elevated but with history of orthostatic hypotension do not feel like we can increase dose further-continue current medication for now   #Chronic kidney disease stage III and on border with 4- sees Dr. Signe Colt S: GFR is typically in the 30srange-dips down at times into the 20s A/P: Creatinine slightly worse at time of discharge-she is agreeable to update CMP today  #hyperlipidemia/elevated LFT history #possible TIA history 06/2017- aspirin 81 mg S: Medication:atorvastatin 40 mg -in the past Rosuvastatin 40 mg ( change with creatinine clearance around 30-changed to  atorvastatin) Lab Results  Component Value Date   CHOL 144 12/07/2021   HDL 62.10 12/07/2021   LDLCALC 63 12/07/2021   LDLDIRECT 59.0 11/03/2020   TRIG 97.0 12/07/2021   CHOLHDL 2 12/07/2021       Latest Ref Rng & Units 08/07/2022    4:59 AM 08/03/2022   10:00 AM 06/22/2022   10:36 AM  Hepatic Function  Total Protein 6.5 - 8.1 g/dL 5.6  6.0  6.6   Albumin 3.5 - 5.0 g/dL 2.7  3.3  4.2   AST 15 - 41 U/L 29  62  44   ALT 0 - 44 U/L 26  60  47   Alk Phosphatase 38 - 126 U/L 65  80  61   Total Bilirubin 0.3 - 1.2 mg/dL 0.5  0.7  0.3     A/P: Most recent transaminases normal-we will update with labs.  Also trying to add on LDL after labs were drawn to see if LDL still under 70 with adjustment to atorvastatin due to renal function  Recommended follow up: Return for next already scheduled visit or sooner if needed. Future Appointments  Date Time Provider Department Center  08/16/2022 12:45 PM Loreli Slot, MD [facility]  08/17/2022  2:00 PM Bauert, Rella Larve, LCSW [facility] None  09/15/2022  2:00 PM Bauert, Rella Larve, LCSW [facility] None  10/11/2022 11:00 AM Bauert, Rella Larve, LCSW [facility] None  10/24/2022  1:00 PM Erroll Luna, [place] [facility] None  11/30/2022  9:00 AM Shelva Majestic, MD [facility]  12/29/2022 10:30 AM [facility] HEALTH COACH [facility]    Lab/Order associations:   ICD-10-CM   1. Mediastinal mass  J98.59     2. Primary hypertension  I10 CBC with Differential/Platelet    Comprehensive metabolic panel    3. Hyperlipidemia, unspecified hyperlipidemia type  E78.5 LDL cholesterol, direct      Time Spent: 42 minutes of total time (4:18 PM-5 PM) was spent on the date of the encounter performing the following actions: chart review prior to seeing the patient, obtaining history, performing a medically necessary exam, counseling on the imaging results and patient's questions surrounding these-we also brought her husband into the room to reexplain-he was very supportive,  placing orders, and documenting in our EHR.    Return precautions advised.  Tana Conch, MD

## 2022-08-16 ENCOUNTER — Ambulatory Visit (INDEPENDENT_AMBULATORY_CARE_PROVIDER_SITE_OTHER): Payer: Self-pay | Admitting: Thoracic Surgery (Cardiothoracic Vascular Surgery)

## 2022-08-16 ENCOUNTER — Ambulatory Visit
Admission: RE | Admit: 2022-08-16 | Discharge: 2022-08-16 | Disposition: A | Discharge status: Home / Self Care | Payer: Medicare Other | Source: Ambulatory Visit | Attending: Thoracic Surgery (Cardiothoracic Vascular Surgery) | Admitting: Thoracic Surgery (Cardiothoracic Vascular Surgery)

## 2022-08-16 VITALS — BP 136/78 | HR 86 | Resp 20 | Ht 64.0 in

## 2022-08-16 DIAGNOSIS — R918 Other nonspecific abnormal finding of lung field: Secondary | ICD-10-CM | POA: Diagnosis not present

## 2022-08-16 DIAGNOSIS — Z9889 Other specified postprocedural states: Secondary | ICD-10-CM

## 2022-08-16 DIAGNOSIS — J9859 Other diseases of mediastinum, not elsewhere classified: Secondary | ICD-10-CM

## 2022-08-16 DIAGNOSIS — R911 Solitary pulmonary nodule: Secondary | ICD-10-CM

## 2022-08-16 LAB — CBC WITH DIFFERENTIAL/PLATELET
Basophils Absolute: 0.1 10*3/uL (ref 0.0–0.1)
Basophils Relative: 1 % (ref 0.0–3.0)
Eosinophils Absolute: 0.2 10*3/uL (ref 0.0–0.7)
Eosinophils Relative: 2.7 % (ref 0.0–5.0)
HCT: 34.4 % — ABNORMAL LOW (ref 36.0–46.0)
Hemoglobin: 11.3 g/dL — ABNORMAL LOW (ref 12.0–15.0)
Lymphocytes Relative: 25.9 % (ref 12.0–46.0)
Lymphs Abs: 2.1 10*3/uL (ref 0.7–4.0)
MCHC: 32.9 g/dL (ref 30.0–36.0)
MCV: 89.2 fl (ref 78.0–100.0)
Monocytes Absolute: 0.5 10*3/uL (ref 0.1–1.0)
Monocytes Relative: 6 % (ref 3.0–12.0)
Neutro Abs: 5.2 10*3/uL (ref 1.4–7.7)
Neutrophils Relative %: 64.4 % (ref 43.0–77.0)
Platelets: 338 10*3/uL (ref 150.0–400.0)
RBC: 3.85 Mil/uL — ABNORMAL LOW (ref 3.87–5.11)
RDW: 15.7 % — ABNORMAL HIGH (ref 11.5–15.5)
WBC: 8 10*3/uL (ref 4.0–10.5)

## 2022-08-16 LAB — COMPREHENSIVE METABOLIC PANEL
ALT: 34 U/L (ref 0–35)
AST: 41 U/L — ABNORMAL HIGH (ref 0–37)
Albumin: 3.9 g/dL (ref 3.5–5.2)
Alkaline Phosphatase: 125 U/L — ABNORMAL HIGH (ref 39–117)
BUN: 28 mg/dL — ABNORMAL HIGH (ref 6–23)
CO2: 29 mEq/L (ref 19–32)
Calcium: 9.6 mg/dL (ref 8.4–10.5)
Chloride: 104 mEq/L (ref 96–112)
Creatinine, Ser: 1.58 mg/dL — ABNORMAL HIGH (ref 0.40–1.20)
GFR: 30.78 mL/min — ABNORMAL LOW (ref >60.00)
Glucose, Bld: 89 mg/dL (ref 70–99)
Potassium: 4.6 mEq/L (ref 3.5–5.1)
Sodium: 141 mEq/L (ref 135–145)
Total Bilirubin: 0.4 mg/dL (ref 0.2–1.2)
Total Protein: 6.7 g/dL (ref 6.0–8.3)

## 2022-08-16 NOTE — Progress Notes (Signed)
Sandra Craig       [id number] [address]             [id number]    HPI: Sandra Craig returns after recent resection of an anterior mediastinal mass.  Sandra Craig is an 82 year old woman with a past history of hypertension, hyperlipidemia, MAC, fibromyalgia, endometriosis, anal hernia, arthritis, interstitial cystitis, osteopenia, osteoporosis, small bowel obstruction, TIA, and an anterior mediastinal mass.  In December 2023 she presented with chest pain.  She ruled out for MI and pulmonary embolus.  The CT done for pulmonary embolus showed a 4.5 x 4 cm anterior mediastinal mass (increased in size from 1.6 cm in August 2020).  PET/CT showed it was hypermetabolic with central necrosis.  She did have some peribronchovascular nodularity in the right upper lobe consistent with infectious or inflammatory changes.  I did a robotic left VATS for thymectomy on 08/05/2022.  She did well postoperatively at home on postoperative day #2.  Pathology initially was a poorly differentiated carcinoma.  Final pathology showed squamous cell carcinoma.  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   Chronic insomnia    Chronic kidney disease    Cystitis    Depression    Diverticulitis of intestine without perforation or abscess without bleeding    Patient did have abscess but noperforation   Diverticulosis of colon (without mention of hemorrhage)    Endometriosis    Family history of malignant neoplasm of gastrointestinal tract    Fibromyalgia    Gastritis    Hiatal hernia    HIATAL HERNIA 10/08/2008   Qualifier: Diagnosis of  By: Nils Pyle CMA Sandra Craig), Sandra Craig     History of gallstones    HSV-1 infection    Hyperlipidemia    Hypertension    IBS (irritable bowel syndrome)    IC (interstitial cystitis)    Internal hemorrhoid    Mycobacterium avium complex ([place])    history- took antibiotics and completed course    Osteonecrosis ([place])    Osteopenia    Osteoporosis    Palpitations    Polycystic kidney disease    pt denies   Pulmonary nodule 07/2007   5 mm Anterior RUL   Small bowel obstruction (HCC)    Stroke ([place]) 2021   TIA    Current Outpatient Medications  Medication Sig Dispense Refill   acetaminophen (TYLENOL) 500 MG tablet Take 1,000 mg by mouth every 6 (six) hours as needed for moderate pain.     aspirin 81 MG tablet Take 1 tablet (81 mg total) by mouth daily. 30 tablet    atorvastatin (LIPITOR) 40 MG tablet Take 1 tablet (40 mg total) by mouth daily. 90 tablet 3   Calcium Citrate-Vitamin D (CALCIUM + D PO) Take 1 tablet by mouth 2 (two) times daily.     DULoxetine (CYMBALTA) 30 MG capsule TAKE ONE CAPSULE BY MOUTH TWICE DAILY 180 capsule 3   famotidine (PEPCID) 20 MG tablet TAKE 1 TABLET(20 MG) BY MOUTH TWICE DAILY 180 tablet 1   gabapentin (NEURONTIN) 300 MG capsule TAKE 1 CAPSULE(300 MG) BY MOUTH TWICE DAILY AS NEEDED FOR PAIN (Patient taking differently: Take 300 mg by mouth 2 (two) times daily.) 180 capsule 3   losartan (COZAAR) 25 MG tablet Take 12.5 mg by mouth daily.     Multiple Vitamins-Minerals (MULTIVITAMIN WITH MINERALS) tablet Take 1 tablet by mouth daily.  Polyethyl Glycol-Propyl Glycol (SYSTANE) 0.4-0.3 % GEL ophthalmic gel Place 1 application into both eyes 3 (three) times daily as needed (dry eye).     zolpidem (AMBIEN) 5 MG tablet TAKE 1 TABLET BY MOUTH EVERY DAY AT BEDTIME FOR SLEEP (Patient taking differently: Take 5 mg by mouth at bedtime.) 90 tablet 1   No current facility-administered medications for this visit.    Physical Exam BP 136/78 (BP Location: Right Arm, Patient Position: Sitting)   Pulse 86   Resp 20   Ht _0  (1.626 m)   LMP 08/25/1992   SpO2 95% Comment: RA  BMI 25.7 kg/m  82 year old woman in no acute distress Alert and oriented x 3, hoarse voice.  No other focal motor deficits Incisions well-healed Lungs clear with equal breath  sounds bilaterally Cardiac regular rate and rhythm  Diagnostic Tests: CHEST - 2 VIEW   COMPARISON:  X-ray 08/07/2022 and older   FINDINGS: Improved contours of the mediastinum with less bulging on the right Craig. Please correlate with surgical history. No consolidation, pneumothorax, effusion or edema. Normal cardiopericardial silhouette. Previous right IJ line no longer seen. Right shoulder reverse arthroplasty. Improving basilar opacities. Air-fluid level along the stomach beneath the left hemidiaphragm.   IMPRESSION: No acute cardiopulmonary disease.     Electronically Signed   By: Sandra Craig M.D.   On: 08/16/2022 11:57 I personally reviewed her chest x-ray images.  Impression: Sandra Craig is an 82 year old woman with a past history of hypertension, hyperlipidemia, MAC, fibromyalgia, endometriosis, anal hernia, arthritis, interstitial cystitis, osteopenia, osteoporosis, small bowel obstruction, TIA, and an anterior mediastinal mass.  I did a robotic assisted resection of the anterior mediastinal mass on 08/05/2021.  She went home on postoperative day #2.    Pathology reported a squamous cell carcinoma with positive CK5/6 and p40 markers.  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  This would be an extraordinarily unusual location for metastatic disease.  Will refer to Dr. Julien Nordmann of oncology and radiation oncology to get their opinions.  She does have some mild hoarseness.  Her husband says that has improved over the past few days.  Possible she could have some recurrent nerve dysfunction based on size and location of the tumor.  Will follow.  She is doing extremely well from a pain standpoint, only taking Tylenol occasionally.  She may begin driving on a limited basis.  Appropriate precautions were discussed.  There are no restrictions on her activities but she was cautioned to build into new  activities gradually.   Plan: Refer to Dr. Julien Nordmann and radiation oncology for consultation regarding possible adjuvant chemotherapy and radiation therapy. Return in 1 month to check on progress  [name], MD [facility] [phone]

## 2022-08-17 ENCOUNTER — Telehealth: Payer: Self-pay | Admitting: *Deleted

## 2022-08-17 ENCOUNTER — Ambulatory Visit (INDEPENDENT_AMBULATORY_CARE_PROVIDER_SITE_OTHER): Payer: Medicare Other | Admitting: Psychology

## 2022-08-17 ENCOUNTER — Other Ambulatory Visit: Payer: Self-pay

## 2022-08-17 DIAGNOSIS — F331 Major depressive disorder, recurrent, moderate: Secondary | ICD-10-CM

## 2022-08-17 DIAGNOSIS — R918 Other nonspecific abnormal finding of lung field: Secondary | ICD-10-CM

## 2022-08-17 NOTE — Telephone Encounter (Signed)
Patient contacted the office stating she experienced a fall yesterday prior to her surgical follow up appt with Dr. Dorris Fetch. Patient states she is experiencing pain in her back, below her rib cage that extends into her abdomen. Patient asking if any abnormalities can be seen from the chest xray she had yesterday. Advised patient that the chest xray only shows structures within her thoracic area and does display her lower back. Advised patient to contact her PCP to let him know of recent fall for further treatment and management of back pain. Patient verbalized understanding.

## 2022-08-17 NOTE — Progress Notes (Signed)
[facility] Counselor/Therapist Progress Note  Patient ID: Sandra Craig, MRN: [id number],    Date: 08/17/2022  Time Spent: 2:00pm-2:50pm   50 minutes   Treatment Type: Individual Therapy  Reported Symptoms: stress  Mental Status Exam: Appearance:  Casual     Behavior: Appropriate  Motor: Normal  Speech/Language:  Normal Rate  Affect: Appropriate  Mood: normal  Thought process: normal  Thought content:   WNL  Sensory/Perceptual disturbances:   WNL  Orientation: oriented to person, place, time/date, and situation  Attention: Good  Concentration: Good  Memory: WNL  Fund of knowledge:  Good  Insight:   Good  Judgment:  Good  Impulse Control: Good   Risk Assessment: Danger to Self:  No Self-injurious Behavior: No Danger to Others: No Duty to Warn:no Physical Aggression / Violence:No  Access to Firearms a concern: No  Gang Involvement:No   Subjective:  [name] present for individual therapy via phone.  Pt consents to telehealth virtual session due to COVID 19 pandemic. Location of pt: home Location of therapist: home office.  Pt talked about her health.   She was diagnosed with cancer in December.  She had surgery and had the mass removed and will be working with an oncologist for radiation treatment.  Pt's cancer is very rare so there is not much information about prognosis.  Pt states her husband and family have rallied around her and are providing support.  Pt states she feels scared at times.   Addressed pt's fears and worries. She copes with the fear by relying on her faith.   Pt is trying to take one day at a time.   Pt is also staying connected with her friends who are very supportive.   Worked on coping strategies.  Worked on self care strategies.   Provided supportive therapy.    Interventions: Cognitive Behavioral Therapy and Insight-Oriented  Diagnosis: F33.1  Plan: Plan to meet in two weeks.  Pt is progressing toward treatment goals.    Plan of Care: Recommend ongoing therapy.   Pt participated in setting treatment goals.  Pt wants to have someone to talk to and to improve coping skills.  Pt wants to feel less anxious and depressed.  Plan to meet every two weeks.    Treatment Plan (Treatment Plan Target Date: 10/26/2022) Client Abilities/Strengths  Pt is bright, engaging, and motivated for therapy.   Client Treatment Preferences  Individual therapy.  Client Statement of Needs  Improve coping skills.  Symptoms  Depressed or irritable mood. Diminished interest in or enjoyment of activities. Lack of energy. Feelings of hopelessness, worthlessness, or inappropriate guilt. Unresolved grief issues.   Problems Addressed  Unipolar Depression Goals 1. Alleviate depressive symptoms and return to previous level of effective functioning. 2. Appropriately grieve the loss in order to normalize mood and to return to previously adaptive level of functioning. Objective Learn and implement behavioral strategies to overcome depression. Target Date: 2022-10-26 Frequency: Biweekly  Progress: 10 Modality: individual  Related Interventions Engage the client in "behavioral activation," increasing his/her activity level and contact with sources of reward, while identifying processes that inhibit activation.  Use behavioral techniques such as instruction, rehearsal, role-playing, role reversal, as needed, to facilitate activity in the client's daily life; reinforce success. Assist the client in developing skills that increase the likelihood of deriving pleasure from behavioral activation (e.g., assertiveness skills, developing an exercise plan, less internal/more external focus, increased social involvement); reinforce success. Objective Identify important people in life, past and present,  and describe the quality, good and poor, of those relationships. Target Date: 2022-10-26 Frequency: Biweekly  Progress: 10 Modality: individual   Related Interventions Conduct Interpersonal Therapy beginning with the assessment of the client's "interpersonal inventory" of important past and present relationships; develop a case formulation linking depression to grief, interpersonal role disputes, role transitions, and/or interpersonal deficits). Objective Learn and implement problem-solving and decision-making skills. Target Date: 2022-10-26 Frequency: Biweekly  Progress: 10 Modality: individual  Related Interventions Conduct Problem-Solving Therapy using techniques such as psychoeducation, modeling, and role-playing to teach client problem-solving skills (i.e., defining a problem specifically, generating possible solutions, evaluating the pros and cons of each solution, selecting and implementing a plan of action, evaluating the efficacy of the plan, accepting or revising the plan); role-play application of the problem-solving skill to a real life issue. Encourage in the client the development of a positive problem orientation in which problems and solving them are viewed as a natural part of life and not something to be feared, despaired, or avoided. 3. Develop healthy interpersonal relationships that lead to the alleviation and help prevent the relapse of depression. 4. Develop healthy thinking patterns and beliefs about self, others, and the world that lead to the alleviation and help prevent the relapse of depression. 5. Recognize, accept, and cope with feelings of depression. Diagnosis F33.1  Conditions For Discharge Achievement of treatment goals and objectives   Salomon Fick, LCSW

## 2022-08-17 NOTE — Progress Notes (Signed)
Today was my introductory phone call to the patient. I introduced myself and explained my role as a Statistician. I wanted to verify that an appt on 1/22 at 11:45 with a lab appointment at 11:15 would suit her, and the patient verified that would work. A message was sent to scheduling to complete this process. CBC, CMP ordered.  I reviewed any barriers the patient may have. At this time, the patient has minimal barriers. She will be bringing her husband and another support person to her appointment with Dr.Mohamed. She was made aware of the location of the Cancer Center and she has a means of transportation. I told her I would see her at the appointment with Dr.Mohamed. Patient was very pleasant and verbalized understanding of the information provided. I gave the patient my number in the event any questions or concerns arise.

## 2022-08-18 ENCOUNTER — Ambulatory Visit
Admission: RE | Admit: 2022-08-18 | Discharge: 2022-08-18 | Disposition: A | Discharge status: Home / Self Care | Payer: Medicare Other | Source: Ambulatory Visit | Attending: Radiation Oncology | Admitting: Radiation Oncology

## 2022-08-18 ENCOUNTER — Telehealth: Payer: Self-pay | Admitting: Family Medicine

## 2022-08-18 ENCOUNTER — Encounter: Payer: Self-pay | Admitting: Radiation Oncology

## 2022-08-18 ENCOUNTER — Other Ambulatory Visit: Payer: Self-pay

## 2022-08-18 VITALS — BP 136/79 | HR 84 | Temp 96.9°F | Resp 18 | Ht 64.0 in | Wt 149.1 lb

## 2022-08-18 DIAGNOSIS — N189 Chronic kidney disease, unspecified: Secondary | ICD-10-CM | POA: Diagnosis not present

## 2022-08-18 DIAGNOSIS — K56609 Unspecified intestinal obstruction, unspecified as to partial versus complete obstruction: Secondary | ICD-10-CM | POA: Insufficient documentation

## 2022-08-18 DIAGNOSIS — K449 Diaphragmatic hernia without obstruction or gangrene: Secondary | ICD-10-CM | POA: Diagnosis not present

## 2022-08-18 DIAGNOSIS — E785 Hyperlipidemia, unspecified: Secondary | ICD-10-CM | POA: Insufficient documentation

## 2022-08-18 DIAGNOSIS — Z7982 Long term (current) use of aspirin: Secondary | ICD-10-CM | POA: Diagnosis not present

## 2022-08-18 DIAGNOSIS — I10 Essential (primary) hypertension: Secondary | ICD-10-CM | POA: Insufficient documentation

## 2022-08-18 DIAGNOSIS — C37 Malignant neoplasm of thymus: Secondary | ICD-10-CM | POA: Diagnosis not present

## 2022-08-18 DIAGNOSIS — Z87891 Personal history of nicotine dependence: Secondary | ICD-10-CM | POA: Insufficient documentation

## 2022-08-18 DIAGNOSIS — R918 Other nonspecific abnormal finding of lung field: Secondary | ICD-10-CM | POA: Insufficient documentation

## 2022-08-18 DIAGNOSIS — Z800 Family history of malignant neoplasm of digestive organs: Secondary | ICD-10-CM | POA: Diagnosis not present

## 2022-08-18 DIAGNOSIS — Z79899 Other long term (current) drug therapy: Secondary | ICD-10-CM | POA: Diagnosis not present

## 2022-08-18 DIAGNOSIS — M797 Fibromyalgia: Secondary | ICD-10-CM | POA: Diagnosis not present

## 2022-08-18 NOTE — Progress Notes (Signed)
Thoracic Location of Tumor / Histology:   Patient presented with complaints of chest pain.  MI and PE was ruled out with CT imaging.  Incidental finding on CT showed a 4.5 x 4 cm anterior mediastinal mass.  PET 07/14/2022: Slightly necrotic anterior mediastinal mass with peripheral hypermetabolism, worrisome for thymic carcinoma. Lack of additional hypermetabolic adenopathy makes lymphoma less likely. No evidence of distant metastatic disease.  Increasing peribronchovascular nodularity and ground-glass in the posterior segment right upper lobe, indicative of an infectious bronchiolitis or chronic mycobacterium avium complex.   CT Chest 07/03/2022: Significant interval enlargement of prevascular lymph node which now fills the anterior mediastinum and measures 4.5 x 4.0 cm compared to a maximum of 1.6 cm on prior imaging from August of 2020. Differential considerations include thymoma, lymphoma, teratoma, thyroid neoplasm and other metastatic disease. 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     Biopsies of Mediastinal Mass 08/05/2022    Tobacco/Marijuana/Snuff/ETOH use:    Past/Anticipated interventions by cardiothoracic surgery, if any:  Dr. 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 This would be an extraordinarily unusual location for metastatic disease.  -Will refer to Dr. Julien Nordmann of oncology and radiation oncology to get their opinions.  -Follow-up 09/20/2022  Past/Anticipated interventions by medical oncology, if any:  Dr. Julien Nordmann 08/22/2022   Signs/Symptoms Weight changes, if any: States that she  is gaining weight slowing. Respiratory complaints, if any: None Hemoptysis, if any: No Pain issues, if any:  States that she fell 2 days ago over the bath tub and hurt her back.States that she has since a doctor since her fall.States that she the doctor did a chest x-ray.  SAFETY ISSUES: Prior radiation?  No Pacemaker/ICD?  No Possible current pregnancy? Hysterectomy Is the patient on methotrexate? No  Current Complaints / other details:   Vitals:   08/18/22 1441  BP: 136/79  Pulse: 84  Resp: 18  Temp: (!) 96.9 F (36.1 C)  TempSrc: Temporal  SpO2: 97%  Weight: 67.6 kg  Height: _0  (1.626 m)

## 2022-08-18 NOTE — Telephone Encounter (Signed)
Pt would like a call back with imaging results. Please advise.

## 2022-08-18 NOTE — Telephone Encounter (Signed)
Looks like pt had xray on 1/16 but hasn't been commented on yet.

## 2022-08-18 NOTE — Telephone Encounter (Signed)
Cardiothoracic surgery should give formal result review but on brief review everything appeared stable to slightly improved

## 2022-08-18 NOTE — Progress Notes (Signed)
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   HISTORY OF PRESENT ILLNESS: Sandra Craig is a 82 y.o. female seen at the request of Dr. Roxan Hockey for a squamous cell carcinoma of the mediastinum. The patient had a known mass in the mediastinum in August 2020 measuring about 1.6 cm. She had complaints of chest pain and shortness of breath recently and a CT on 07/03/22 showed a 4.5 x 4 cm anterior mediastinal mass and a PET scan on 07/14/22 showed hypermetabolism in the mass with an SUV of 6.1 in additional to ground glass changes in the apical RUL that was also hypermetabolic. It was felt the RUL findings were more inflammatory.   She underwent robotic resection of the anterior mediastinal mass on 08/05/22 that on frozen showed a poorly differentiated carcinoma; but squamous cell carcinoma on final pathology with a focus of extracapsular extension, and 4 sampled nodes were negative.  He was also lymphoid tissue in the periphery of the mass possibly representing a mediastinal lymph node.  She's seen to discuss adjuvant therapy options.     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   Chronic insomnia    Chronic kidney disease    Cystitis    Depression    Diverticulitis of intestine without perforation or abscess without bleeding    Patient did have abscess but noperforation   Diverticulosis of colon (without mention of hemorrhage)    Endometriosis    Family history of malignant neoplasm of gastrointestinal tract    Fibromyalgia    Gastritis    Hiatal  hernia    HIATAL HERNIA 10/08/2008   Qualifier: Diagnosis of  By: Nils Pyle CMA (AAMA), Mearl Latin     History of gallstones    HSV-1 infection    Hyperlipidemia    Hypertension    IBS (irritable bowel syndrome)    IC (interstitial cystitis)    Internal hemorrhoid    Mycobacterium avium complex ([place])    history- took antibiotics and completed course   Osteonecrosis ([place])    Osteopenia    Osteoporosis    Palpitations    Polycystic kidney disease    pt denies   Pulmonary nodule 07/2007   5 mm Anterior RUL   Small bowel obstruction ([place])    Stroke ([place]) 2021   TIA       PAST SURGICAL HISTORY: Past Surgical History:  Procedure Laterality Date   ABDOMINAL HYSTERECTOMY  1994   TAH,BSO FOR ENDOMETRIOSIS   ABDOMINAL SURGERY  2011   small intestine blockage   CHOLECYSTECTOMY     GASTROPLASTY  2011   small bowel resection -open   INGUINAL HERNIA REPAIR Right 02/28/2020   Procedure: LAPAROSCOPIC RIGHT INGUINAL HERNIA REPAIR WITH MESH;  Surgeon: Ralene Ok, MD;  Location: [place];  Service: General;  Laterality: Right;   JOINT REPLACEMENT  2008   OOPHORECTOMY  1994   TAH,BSO   PELVIC LAPAROSCOPY     S/P right shoulder rotater cuff  200216/2011   Tear/adhesive capsulitis  SBO Lap  11   Adhesions and small internal hernia   SHOULDER HEMI-ARTHROPLASTY  06/05/2012   Procedure: SHOULDER HEMI-ARTHROPLASTY;  Surgeon: Johnny Bridge, MD;  Location: [place];  Service: Orthopedics;  Laterality: Right;  FOR ARTHRITIS   TOTAL HIP ARTHROPLASTY  FALL OF 2008   rt. partial hip replacement   TOTAL SHOULDER ARTHROPLASTY  06/05/2012   Procedure: TOTAL SHOULDER ARTHROPLASTY;  Surgeon: Johnny Bridge, MD;  Location: [place];  Service: Orthopedics;  Laterality: Right;  RIGHT SHOULDER TOTAL ARTHROPLASTY, HEMIARTHROPLASTY, SHOULDER, FOR ARTHRITIS   VIDEO BRONCHOSCOPY Bilateral 01/28/2015   Procedure: VIDEO BRONCHOSCOPY WITH FLUORO;  Surgeon: Collene Gobble, MD;  Location: [place];  Service:  Cardiopulmonary;  Laterality: Bilateral;   VIDEO BRONCHOSCOPY Bilateral 04/23/2019   Procedure: VIDEO BRONCHOSCOPY WITHOUT FLUORO;  Surgeon: Collene Gobble, MD;  Location: [facility];  Service: Cardiopulmonary;  Laterality: Bilateral;     FAMILY HISTORY:  Family History  Problem Relation Age of Onset   Heart disease Mother        MI at age 17   Emphysema Mother    Thyroid disease Mother        Thyroidectomy/Benign   COPD Father    Pancreatic cancer Sister        died 33   Cancer Brother        adenocarcinoma right lung   COPD Brother    Lymphoma Maternal Grandmother    Colon cancer Paternal Grandmother    Heart attack Paternal Grandfather      SOCIAL HISTORY:  reports that she quit smoking about 46 years ago. Her smoking use included cigarettes. She has a 6.00 pack-year smoking history. She has never used smokeless tobacco. She reports that she does not drink alcohol and does not use drugs. The patient is married and lives in [place]. She is accompanied by her husband.  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   ALLERGIES: Azithromycin and Celecoxib   MEDICATIONS:  Current Outpatient Medications  Medication Sig Dispense Refill   acetaminophen (TYLENOL) 500 MG tablet Take 1,000 mg by mouth every 6 (six) hours as needed for moderate pain.     aspirin 81 MG tablet Take 1 tablet (81 mg total) by mouth daily. 30 tablet    atorvastatin (LIPITOR) 40 MG tablet Take 1 tablet (40 mg total) by mouth daily. 90 tablet 3   Calcium Citrate-Vitamin D (CALCIUM + D PO) Take 1 tablet by mouth 2 (two) times daily.     DULoxetine (CYMBALTA) 30 MG capsule TAKE ONE CAPSULE BY MOUTH TWICE DAILY 180 capsule 3   famotidine (PEPCID) 20 MG tablet TAKE 1 TABLET(20 MG) BY MOUTH TWICE DAILY 180 tablet 1   gabapentin (NEURONTIN) 300 MG capsule TAKE 1 CAPSULE(300 MG) BY MOUTH TWICE DAILY AS NEEDED FOR PAIN (Patient taking  differently: Take 300 mg by mouth 2 (two) times daily.) 180 capsule 3   losartan (COZAAR) 25 MG tablet Take 12.5 mg by mouth daily.     Multiple Vitamins-Minerals (MULTIVITAMIN WITH MINERALS) tablet Take 1 tablet by mouth daily.     Polyethyl Glycol-Propyl Glycol (SYSTANE) 0.4-0.3 % GEL ophthalmic gel Place 1 application into both eyes 3 (three) times daily as needed (dry eye).     zolpidem (AMBIEN) 5 MG tablet TAKE 1 TABLET BY MOUTH EVERY DAY AT BEDTIME FOR SLEEP (Patient taking differently: Take 5 mg by mouth at bedtime.) 90 tablet 1  No current facility-administered medications for this encounter.     REVIEW OF SYSTEMS: On review of systems, the patient reports that she is doing quite well overall since her surgery and only using Tylenol as needed for pain relief.  She states that she is not able to take NSAIDs due to her kidney function nor have imaging with contrast due to this.  She states that she fell a little over a week and a half ago when she slipped on an item in her bathroom and hit her mid left back on her bathtub.  She has been having discomfort in this area since.  She did see Dr. Roxan Hockey and a x-ray in his office last week did not show any acute findings of the chest wall to suggest a fracture. No other complaints are verbalized.      PHYSICAL EXAM:  Wt Readings from Last 3 Encounters:  08/18/22 149 lb 2 oz (67.6 kg)  08/15/22 146 lb 6.4 oz (66.4 kg)  08/07/22 129 lb 8 oz (58.7 kg)   Temp Readings from Last 3 Encounters:  08/18/22 (!) 96.9 F (36.1 C) (Temporal)  08/15/22 98.1 F (36.7 C) (Temporal)  08/07/22 97.9 F (36.6 C) (Oral)   BP Readings from Last 3 Encounters:  08/18/22 136/79  08/16/22 136/78  08/15/22 (!) 140/85   Pulse Readings from Last 3 Encounters:  08/18/22 84  08/16/22 86  08/15/22 78   Pain Assessment Pain Score: 8  Pain Loc: Back/10  In general this is a well appearing caucasian female in no acute distress. She's alert and oriented  x4 and appropriate throughout the examination. Cardiopulmonary assessment is negative for acute distress and she exhibits normal effort. A small ecchymosis is noted over the left CVA, the bruise is about 1 cm. No asymmetry is noted of the posterior trunk.    ECOG = 1  0 - Asymptomatic (Fully active, able to carry on all predisease activities without restriction)  1 - Symptomatic but completely ambulatory (Restricted in physically strenuous activity but ambulatory and able to carry out work of a light or sedentary nature. For example, light housework, office work)  2 - Symptomatic, <50% in bed during the day (Ambulatory and capable of all self care but unable to carry out any work activities. Up and about more than 50% of waking hours)  3 - Symptomatic, >50% in bed, but not bedbound (Capable of only limited self-care, confined to bed or chair 50% or more of waking hours)  4 - Bedbound (Completely disabled. Cannot carry on any self-care. Totally confined to bed or chair)  5 - Death   Eustace Pen MM, Creech RH, Tormey DC, et al. [phone]). "Toxicity and response criteria of the [facility]". [place] Oncol. 5 (6): 649-55    LABORATORY DATA:  Lab Results  Component Value Date   WBC 8.0 08/15/2022   HGB 11.3 (L) 08/15/2022   HCT 34.4 (L) 08/15/2022   MCV 89.2 08/15/2022   PLT 338.0 08/15/2022   Lab Results  Component Value Date   NA 141 08/15/2022   K 4.6 08/15/2022   CL 104 08/15/2022   CO2 29 08/15/2022   Lab Results  Component Value Date   ALT 34 08/15/2022   AST 41 (H) 08/15/2022   ALKPHOS 125 (H) 08/15/2022   BILITOT 0.4 08/15/2022      RADIOGRAPHY: DG Chest 2 View  Result Date: 08/16/2022 CLINICAL DATA:  Lung nodule. History of fall today. Prior mediastinal mass removed  08/05/2022 EXAM: CHEST - 2 VIEW COMPARISON:  X-ray 08/07/2022 and older FINDINGS: Improved contours of the mediastinum with less bulging on the right side. Please correlate with  surgical history. No consolidation, pneumothorax, effusion or edema. Normal cardiopericardial silhouette. Previous right IJ line no longer seen. Right shoulder reverse arthroplasty. Improving basilar opacities. Air-fluid level along the stomach beneath the left hemidiaphragm. IMPRESSION: No acute cardiopulmonary disease. Electronically Signed   By: Jill Side M.D.   On: 08/16/2022 11:57   DG CHEST PORT 1 VIEW  Result Date: 08/07/2022 CLINICAL DATA:  Status post robot assisted procedure. EXAM: PORTABLE CHEST 1 VIEW COMPARISON:  August 06, 2022 FINDINGS: The right central line terminates in the central SVC. No pneumothorax. Stable cardiomediastinal silhouette. Mild atelectasis and probably a tiny effusion at the left base. No overt edema. No nodule, mass, or other abnormality. IMPRESSION: 1. Support apparatus as above. 2. Small left effusion with underlying atelectasis. 3. No other significant abnormalities or changes. Electronically Signed   By: Dorise Bullion III M.D.   On: 08/07/2022 09:43   DG Chest Port 1 View  Result Date: 08/06/2022 CLINICAL DATA:  Mediastinal mass EXAM: PORTABLE CHEST 1 VIEW COMPARISON:  August 05, 2022 FINDINGS: Stable right central line and left chest tube. No pneumothorax. The cardiomediastinal silhouette is stable. No overt edema or suspicious infiltrate. No other interval changes. IMPRESSION: Support apparatus as above. No pneumothorax. No acute abnormalities. Electronically Signed   By: Dorise Bullion III M.D.   On: 08/06/2022 10:10   DG Chest Port 1 View  Result Date: 08/05/2022 CLINICAL DATA:  Mediastinal mass EXAM: PORTABLE CHEST 1 VIEW COMPARISON:  X-ray 08/03/2022.  CT PET 07/14/2022 FINDINGS: Right IJ catheter in place with tip at the SVC right atrial junction. No right pneumothorax. Normal cardiopericardial silhouette. Tortuous and ectatic aorta. Underinflation. Bandlike opacity at the left lung base and some areas of the right upper lobe, possibly atelectatic versus  infiltrate. Recommend follow-up. No effusion or edema. Overlapping cardiac leads. Right shoulder arthroplasty. Left-sided chest tube with a tiny left apical pneumothorax. IMPRESSION: Left-sided chest tube with a tiny left apical pneumothorax. Right IJ line. Subtle patchy opacity left lung base and right upper lung. Possible atelectasis versus infiltrate and recommend follow-up. Electronically Signed   By: Jill Side M.D.   On: 08/05/2022 11:29   DG Chest 2 View  Result Date: 08/03/2022 CLINICAL DATA:  Preop evaluation for mediastinal mass. EXAM: CHEST - 2 VIEW COMPARISON:  July 05, 2022 FINDINGS: The heart size and mediastinal contours are stable. Both lungs are clear. The visualized skeletal structures are stable. IMPRESSION: No active cardiopulmonary disease. Electronically Signed   By: Abelardo Diesel M.D.   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 Dr. Lisbeth Renshaw discusses the pathology findings and reviews the nature of carcinoma of this structure. The report was reviewed, and the patient indicates that Dr. Roxan Hockey had concerns about two sides of the tumor. While the specimen does not have margins called out specifically, we will try to touch base with pathology to determine if that can be deteremined. We will also discuss her case in next week's thoracic oncology conference. In the meantime, it does seem that there are concerns for close or positive margins and extracapsular extension which would increase the risks of local recurrence. It would be anticipated that after discussion we offer chemoRT, versus radiotherapy to the surgical cavity alone.  We discussed the risks, benefits, short, and  long term effects of radiotherapy, as well as the curative intent, and the patient is interested in proceeding after clarity from multidisciplinary discussion. Dr. Lisbeth Renshaw discusses the delivery and logistics of radiotherapy and anticipates a course of up to 6 weeks  of radiotherapy to the mediastinum.  We will follow up with her next week and coordinate with medical oncology. The patient will be contacted to coordinate treatment planning by our simulation department after this decision is clarified.    In a visit lasting 60 minutes, greater than 50% of the time was spent face to face discussing the patient's condition, in preparation for the discussion, and coordinating the patient's care.   The above documentation reflects my direct findings during this shared patient visit. Please see the separate note by Dr. Lisbeth Renshaw on this date for the remainder of the patient's plan of care.    Carola Rhine, [facility]   **Disclaimer: This note was dictated with voice recognition software. Similar sounding words can inadvertently be transcribed and this note may contain transcription errors which may not have been corrected upon publication of note.**

## 2022-08-18 NOTE — Telephone Encounter (Signed)
Called and spoke with pt and below message given.

## 2022-08-19 NOTE — Progress Notes (Signed)
I discussed the patient's case with Dr. Corey Harold in pathology. Unfortunately the specimen was not oriented so they cannot formally call out margins, but within the 3 slides, there are concerns for 1-2 areas within the slides that are either focally to the end of the specimen or in very close poximity. There is extracapsular extension so it is favored that this is not encapsulated. There is still some question about the nodal tissue mentioned in the specimen. This is not a separate nodal station but felt to be part of the primary thymic gland which contains lymphoid tissue.

## 2022-08-19 NOTE — Addendum Note (Signed)
Encounter addended by: Ronny Bacon, PA-C on: 08/19/2022 1:13 PM  Actions taken: Clinical Note Signed

## 2022-08-22 ENCOUNTER — Other Ambulatory Visit: Payer: Self-pay

## 2022-08-22 ENCOUNTER — Other Ambulatory Visit: Payer: Self-pay | Admitting: Medical Oncology

## 2022-08-22 ENCOUNTER — Telehealth: Payer: Self-pay | Admitting: Family Medicine

## 2022-08-22 ENCOUNTER — Inpatient Hospital Stay: Payer: Medicare Other | Attending: Internal Medicine | Admitting: Internal Medicine

## 2022-08-22 ENCOUNTER — Encounter: Payer: Self-pay | Admitting: Internal Medicine

## 2022-08-22 ENCOUNTER — Inpatient Hospital Stay: Payer: Medicare Other

## 2022-08-22 VITALS — BP 183/88 | HR 87 | Temp 98.3°F | Resp 18 | Wt 148.2 lb

## 2022-08-22 DIAGNOSIS — M549 Dorsalgia, unspecified: Secondary | ICD-10-CM | POA: Diagnosis not present

## 2022-08-22 DIAGNOSIS — Z87891 Personal history of nicotine dependence: Secondary | ICD-10-CM | POA: Diagnosis not present

## 2022-08-22 DIAGNOSIS — R918 Other nonspecific abnormal finding of lung field: Secondary | ICD-10-CM

## 2022-08-22 DIAGNOSIS — C771 Secondary and unspecified malignant neoplasm of intrathoracic lymph nodes: Secondary | ICD-10-CM | POA: Diagnosis not present

## 2022-08-22 DIAGNOSIS — N189 Chronic kidney disease, unspecified: Secondary | ICD-10-CM | POA: Diagnosis not present

## 2022-08-22 DIAGNOSIS — C801 Malignant (primary) neoplasm, unspecified: Secondary | ICD-10-CM | POA: Insufficient documentation

## 2022-08-22 DIAGNOSIS — C781 Secondary malignant neoplasm of mediastinum: Secondary | ICD-10-CM | POA: Diagnosis not present

## 2022-08-22 DIAGNOSIS — C3490 Malignant neoplasm of unspecified part of unspecified bronchus or lung: Secondary | ICD-10-CM

## 2022-08-22 DIAGNOSIS — Z800 Family history of malignant neoplasm of digestive organs: Secondary | ICD-10-CM | POA: Diagnosis not present

## 2022-08-22 DIAGNOSIS — I129 Hypertensive chronic kidney disease with stage 1 through stage 4 chronic kidney disease, or unspecified chronic kidney disease: Secondary | ICD-10-CM

## 2022-08-22 DIAGNOSIS — Z801 Family history of malignant neoplasm of trachea, bronchus and lung: Secondary | ICD-10-CM

## 2022-08-22 DIAGNOSIS — C7989 Secondary malignant neoplasm of other specified sites: Secondary | ICD-10-CM | POA: Diagnosis not present

## 2022-08-22 DIAGNOSIS — M810 Age-related osteoporosis without current pathological fracture: Secondary | ICD-10-CM

## 2022-08-22 LAB — CBC WITH DIFFERENTIAL (CANCER CENTER ONLY)
Abs Immature Granulocytes: 0.01 10*3/uL (ref 0.00–0.07)
Basophils Absolute: 0 10*3/uL (ref 0.0–0.1)
Basophils Relative: 0 %
Eosinophils Absolute: 0.2 10*3/uL (ref 0.0–0.5)
Eosinophils Relative: 3 %
HCT: 34.1 % — ABNORMAL LOW (ref 36.0–46.0)
Hemoglobin: 10.8 g/dL — ABNORMAL LOW (ref 12.0–15.0)
Immature Granulocytes: 0 %
Lymphocytes Relative: 25 %
Lymphs Abs: 1.7 10*3/uL (ref 0.7–4.0)
MCH: 29.3 pg (ref 26.0–34.0)
MCHC: 31.7 g/dL (ref 30.0–36.0)
MCV: 92.7 fL (ref 80.0–100.0)
Monocytes Absolute: 0.4 10*3/uL (ref 0.1–1.0)
Monocytes Relative: 7 %
Neutro Abs: 4.4 10*3/uL (ref 1.7–7.7)
Neutrophils Relative %: 65 %
Platelet Count: 270 10*3/uL (ref 150–400)
RBC: 3.68 MIL/uL — ABNORMAL LOW (ref 3.87–5.11)
RDW: 15.5 % (ref 11.5–15.5)
WBC Count: 6.8 10*3/uL (ref 4.0–10.5)
nRBC: 0 % (ref 0.0–0.2)

## 2022-08-22 LAB — CMP (CANCER CENTER ONLY)
ALT: 37 U/L (ref 0–44)
AST: 34 U/L (ref 15–41)
Albumin: 3.5 g/dL (ref 3.5–5.0)
Alkaline Phosphatase: 129 U/L — ABNORMAL HIGH (ref 38–126)
Anion gap: 4 — ABNORMAL LOW (ref 5–15)
BUN: 25 mg/dL — ABNORMAL HIGH (ref 8–23)
CO2: 33 mmol/L — ABNORMAL HIGH (ref 22–32)
Calcium: 9.6 mg/dL (ref 8.9–10.3)
Chloride: 104 mmol/L (ref 98–111)
Creatinine: 1.63 mg/dL — ABNORMAL HIGH (ref 0.44–1.00)
GFR, Estimated: 32 mL/min — ABNORMAL LOW (ref >60)
Glucose, Bld: 84 mg/dL (ref 70–99)
Potassium: 4.2 mmol/L (ref 3.5–5.1)
Sodium: 141 mmol/L (ref 135–145)
Total Bilirubin: 0.4 mg/dL (ref 0.3–1.2)
Total Protein: 6.3 g/dL — ABNORMAL LOW (ref 6.5–8.1)

## 2022-08-22 MED ORDER — OXYCODONE-ACETAMINOPHEN 5-325 MG PO TABS: 1.0000 | ORAL_TABLET | Freq: Three times a day (TID) | ORAL | 0 refills | Status: DC | PRN

## 2022-08-22 NOTE — Telephone Encounter (Signed)
Pt was advised to go to ED   Patient Name: Sandra Craig Gender: Female DOB: 05/29/1941 Age: 82 Y 3 M 18 D Return Phone Number: [phone] (Primary), [phone] (Secondary) Address: City/ State/ Zip: [place]  [address] Client [facility] Night - Human resources officer [facility] Night Provider Tana Conch- MD Contact Type Call Who Is Calling Patient / Member / Family / Caregiver Call Type Triage / Clinical Relationship To Patient Self Return Phone Number [phone] (Primary) Chief Complaint Back Injury Reason for Call Symptomatic / Request for Health Information Initial Comment Caller states she has pain in her back from a fall. The fall happened on Tuesday. She had surgery on the 5th to remove a mass between her lungs. Translation No Nurse Assessment Nurse: Cox, RN, Holly Date/Time (Eastern Time): 08/20/2022 5:22:08 PM Confirm and document reason for call. If symptomatic, describe symptoms. ---Caller states she has pain in her back from a fall. The fall happened on Tuesday. Had X-rays after the fall with no diagnosis of a fracture. She had surgery on the 5th to remove a mass between her lungs. Have tried heat, ice, pain medication, and patient is still having pain. Does the patient have any new or worsening symptoms? ---Yes Will a triage be completed? ---Yes Related visit to physician within the last 2 weeks? ---Yes Does the PT have any chronic conditions? (i.e. diabetes, asthma, this includes High risk factors for pregnancy, etc.) ---Unknown Is this a behavioral health or substance abuse call? ---No Guidelines Guideline Title Affirmed Question Affirmed Notes Nurse Date/Time (Eastern Time) Back Injury [1] SEVERE PAIN in kidney area (flank) AND [2] follows direct blow to that site Cox, RN, [facility] 08/20/2022 5:27:09 PM Disp. Time Lamount Cohen Time) Disposition Final User 08/20/2022 5:31:04 PM Go to  ED Now Yes Cox, RN, [facility] Final Disposition 08/20/2022 5:31:04 PM Go to ED Now Yes Cox, RN, Sharene Butters Disagree/Comply Disagree Caller Understands Yes PreDisposition Call Doctor Care Advice Given Per Guideline GO TO ED NOW: * You need to be seen in the Emergency Department. * Go to the ED at ___________ Hospital. * Leave now. Drive carefully. CARE ADVICE given per Back Injury (Adult) guideline. Referrals GO TO FACILITY REFUSED

## 2022-08-22 NOTE — Progress Notes (Signed)
Today was the pt's medical oncology consult with Dr.Mohamed. She is accompanied by her husband, [name], and her sons Casimiro Needle and Roe Coombs were on speaker phone during the appointment. The plan for the patient is having another CT to follow up with the areas of activity in the R lung found on PET scan, and a biopsy if necessary depending on those results.  She will be starting radiation in early February after healing from her surgery on 08/05/22.  I provided the number for central scheduling to schedule her chest CT, and I suggested she call on Wednesday to schedule if she has been contacted by then. The patient will follow up with Dr.Mohamed when the CT scan results are back.

## 2022-08-22 NOTE — Telephone Encounter (Signed)
Please schedule ov for pt to f/u on this.

## 2022-08-22 NOTE — Progress Notes (Signed)
[place] [phone]   Fax:([phone]) [phone]  CONSULT NOTE  REFERRING PHYSICIAN: Dr. 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 female.  With past medical history significant for multiple medical problems including history of anxiety, osteoarthritis, chronic kidney disease, depression, diverticulosis, fibromyalgia, hypertension, dyslipidemia, osteoporosis, irritable bowel syndrome as well as TIA.  The patient also has remote history of smoking but quit in 1976.  She mentions that she has been complaining of fatigue and midsternal chest pain.  She was seen at one of the urgent care center and imaging studies at that time showed suspicious fullness on the prevascular area.  She had CT scan of the chest without contrast on July 03, 2022 and that showed significant interval enlargement of prevascular lymph node which failed the anterior mediastinum and measured 4.5 x 4.0 cm compared to 1.6 cm on prior imaging studies from August 2020.  There was a slight interval progression of bilateral tree-in-bud nodularity and bronchiectasis most consistent with chronic indolent atypical infection such as MAI.  The patient was referred to Dr. Roxan Hockey and she had a PET scan performed on July 14, 2022 and that showed slightly necrotic anterior mediastinal mass with peripheral hypermetabolism worrisome for thymic carcinoma.  There was lack of additional hypermetabolic adenopathy that makes lymphoma less likely and no evidence of distant metastatic disease.  There was also increasing peribronchovascular nodularity and groundglass in the posterior segment of the right upper lobe indicative of an infectious bronchiolitis or chronic Mycobacterium AVM complex.  On August 05, 2022 the patient underwent robotic assisted left thoracoscopy for  thymectomy/resection of anterior mediastinal mass under the care of Dr. Roxan Hockey.  The Final pathology [id number]) showed poorly differentiated squamous cell carcinoma.  Lymphoid tissue was noted in the periphery of the mass and this could possibly represent a mediastinal lymph node with metastatic poorly differentiated squamous cell carcinoma.  The tumor does show extensive necrosis and a single area suspicious for extracapsular extension is also noted. Appropriately controlled immunohistochemical stains show the  tumor cells are positive for CK5/6 and p40.  For additional lymph nodes were identified in the tissue and did not reveal any evidence of malignancy.  The patient was referred to me today for evaluation and recommendation regarding treatment of her condition. Seen today she is feeling fine except for back pain after a fall recently.  She has chest x-ray that showed no evidence of fractures.  She continues to have mild cough with no shortness of breath or hemoptysis.  She has no nausea, vomiting, diarrhea but has constipation.  She has no headache or visual changes. Family history significant for mother with heart disease as well as thyroid disease.  Father had COPD.  Sister had pancreatic cancer and brother had lung cancer. The patient is married and has 3 stepsons.  She used to work as a Customer service manager.  She was accompanied today by her husband Sandra Craig and 2 of her sons were available by phone during the visit.  She has remote history for smoking for around 15 years but quit in 1976.  She has no history of alcohol or drug abuse.  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   Chronic insomnia    Chronic kidney disease  Cystitis    Depression    Diverticulitis of intestine without perforation or abscess without bleeding    Patient did have abscess but noperforation   Diverticulosis of colon (without mention of  hemorrhage)    Endometriosis    Family history of malignant neoplasm of gastrointestinal tract    Fibromyalgia    Gastritis    Hiatal hernia    HIATAL HERNIA 10/08/2008   Qualifier: Diagnosis of  By: Nils Pyle CMA (AAMA), Mearl Latin     History of gallstones    HSV-1 infection    Hyperlipidemia    Hypertension    IBS (irritable bowel syndrome)    IC (interstitial cystitis)    Internal hemorrhoid    Mycobacterium avium complex ([place])    history- took antibiotics and completed course   Osteonecrosis ([place])    Osteopenia    Osteoporosis    Palpitations    Polycystic kidney disease    pt denies   Pulmonary nodule 07/2007   5 mm Anterior RUL   Small bowel obstruction ([place])    Stroke ([place]) 2021   TIA    Past Surgical History:  Procedure Laterality Date   ABDOMINAL HYSTERECTOMY  1994   TAH,BSO FOR ENDOMETRIOSIS   ABDOMINAL SURGERY  2011   small intestine blockage   CHOLECYSTECTOMY     GASTROPLASTY  2011   small bowel resection -open   INGUINAL HERNIA REPAIR Right 02/28/2020   Procedure: LAPAROSCOPIC RIGHT INGUINAL HERNIA REPAIR WITH MESH;  Surgeon: Ralene Ok, MD;  Location: [facility];  Service: General;  Laterality: Right;   JOINT REPLACEMENT  2008   OOPHORECTOMY  1994   TAH,BSO   PELVIC LAPAROSCOPY     S/P right shoulder rotater cuff  200216/2011   Tear/adhesive capsulitis   SBO Lap  11   Adhesions and small internal hernia   SHOULDER HEMI-ARTHROPLASTY  06/05/2012   Procedure: SHOULDER HEMI-ARTHROPLASTY;  Surgeon: Johnny Bridge, MD;  Location: [facility];  Service: Orthopedics;  Laterality: Right;  FOR ARTHRITIS   TOTAL HIP ARTHROPLASTY  FALL OF 2008   rt. partial hip replacement   TOTAL SHOULDER ARTHROPLASTY  06/05/2012   Procedure: TOTAL SHOULDER ARTHROPLASTY;  Surgeon: Johnny Bridge, MD;  Location: [facility];  Service: Orthopedics;  Laterality: Right;  RIGHT SHOULDER TOTAL ARTHROPLASTY, HEMIARTHROPLASTY, SHOULDER, FOR ARTHRITIS   VIDEO BRONCHOSCOPY Bilateral 01/28/2015    Procedure: VIDEO BRONCHOSCOPY WITH FLUORO;  Surgeon: Collene Gobble, MD;  Location: [facility];  Service: Cardiopulmonary;  Laterality: Bilateral;   VIDEO BRONCHOSCOPY Bilateral 04/23/2019   Procedure: VIDEO BRONCHOSCOPY WITHOUT FLUORO;  Surgeon: Collene Gobble, MD;  [facility] ENDOSCOPY;  Service: Cardiopulmonary;  Laterality: Bilateral;    Family History  Problem Relation Age of Onset   Heart disease Mother        MI at age 55   Emphysema Mother    Thyroid disease Mother        Thyroidectomy/Benign   COPD Father    Pancreatic cancer Sister        died 66   Cancer Brother        adenocarcinoma right lung   COPD Brother    Lymphoma Maternal Grandmother    Colon cancer Paternal Grandmother    Heart attack Paternal Grandfather     Social History Social History   Tobacco Use   Smoking status: Former    Packs/day: 0.75    Years: 8.00    Total pack years: 6.00    Types: Cigarettes  Quit date: 08/01/1976    Years since quitting: 46.0   Smokeless tobacco: Never   Tobacco comments:    Quit in 1978  Vaping Use   Vaping Use: Never used  Substance Use Topics   Alcohol use: No    Alcohol/week: 0.0 standard drinks of alcohol   Drug use: No    Allergies  Allergen Reactions   Azithromycin Rash    Pruritic rash diffuse     Celecoxib Nausea Only    Current Outpatient Medications  Medication Sig Dispense Refill   acetaminophen (TYLENOL) 500 MG tablet Take 1,000 mg by mouth every 6 (six) hours as needed for moderate pain.     aspirin 81 MG tablet Take 1 tablet (81 mg total) by mouth daily. 30 tablet    atorvastatin (LIPITOR) 40 MG tablet Take 1 tablet (40 mg total) by mouth daily. 90 tablet 3   Calcium Citrate-Vitamin D (CALCIUM + D PO) Take 1 tablet by mouth 2 (two) times daily.     DULoxetine (CYMBALTA) 30 MG capsule TAKE ONE CAPSULE BY MOUTH TWICE DAILY 180 capsule 3   famotidine (PEPCID) 20 MG tablet TAKE 1 TABLET(20 MG) BY MOUTH TWICE DAILY 180 tablet 1   gabapentin  (NEURONTIN) 300 MG capsule TAKE 1 CAPSULE(300 MG) BY MOUTH TWICE DAILY AS NEEDED FOR PAIN (Patient taking differently: Take 300 mg by mouth 2 (two) times daily.) 180 capsule 3   losartan (COZAAR) 25 MG tablet Take 12.5 mg by mouth daily.     Multiple Vitamins-Minerals (MULTIVITAMIN WITH MINERALS) tablet Take 1 tablet by mouth daily.     Polyethyl Glycol-Propyl Glycol (SYSTANE) 0.4-0.3 % GEL ophthalmic gel Place 1 application into both eyes 3 (three) times daily as needed (dry eye).     zolpidem (AMBIEN) 5 MG tablet TAKE 1 TABLET BY MOUTH EVERY DAY AT BEDTIME FOR SLEEP (Patient taking differently: Take 5 mg by mouth at bedtime.) 90 tablet 1   No current facility-administered medications for this visit.    Review of Systems  Constitutional: positive for fatigue Eyes: negative Ears, nose, mouth, throat, and face: negative Respiratory: positive for cough Cardiovascular: negative Gastrointestinal: negative Genitourinary:negative Integument/breast: negative Hematologic/lymphatic: negative Musculoskeletal:positive for back pain Neurological: negative Behavioral/Psych: negative Endocrine: negative Allergic/Immunologic: negative  Physical Exam  MBW:GYKZL, healthy, no distress, well nourished, well developed, and anxious SKIN: skin color, texture, turgor are normal, no rashes or significant lesions HEAD: Normocephalic, No masses, lesions, tenderness or abnormalities EYES: normal, PERRLA, Conjunctiva are pink and non-injected EARS: External ears normal, Canals clear OROPHARYNX:no exudate, no erythema, and lips, buccal mucosa, and tongue normal  NECK: supple, no adenopathy, no JVD LYMPH:  no palpable lymphadenopathy, no hepatosplenomegaly BREAST:not examined LUNGS: clear to auscultation , and palpation HEART: regular rate & rhythm, no murmurs, and no gallops ABDOMEN:abdomen soft, non-tender, normal bowel sounds, and no masses or organomegaly BACK: Back symmetric, no curvature., No CVA  tenderness EXTREMITIES:no joint deformities, effusion, or inflammation, no edema  NEURO: alert & oriented x 3 with fluent speech, no focal motor/sensory deficits  PERFORMANCE STATUS: ECOG 1  LABORATORY DATA: Lab Results  Component Value Date   WBC 6.8 08/22/2022   HGB 10.8 (L) 08/22/2022   HCT 34.1 (L) 08/22/2022   MCV 92.7 08/22/2022   PLT 270 08/22/2022      Chemistry      Component Value Date/Time   NA 141 08/22/2022 1114   NA 140 02/10/2022 0000   K 4.2 08/22/2022 1114   CL 104 08/22/2022 1114  CO2 33 (H) 08/22/2022 1114   BUN 25 (H) 08/22/2022 1114   BUN 24 (A) 02/10/2022 0000   CREATININE 1.63 (H) 08/22/2022 1114   CREATININE 1.61 (H) 07/21/2020 1559   GLU 90 02/10/2022 0000      Component Value Date/Time   CALCIUM 9.6 08/22/2022 1114   CALCIUM 10.3 12/21/2009 2250   ALKPHOS 129 (H) 08/22/2022 1114   AST 34 08/22/2022 1114   ALT 37 08/22/2022 1114   BILITOT 0.4 08/22/2022 1114       RADIOGRAPHIC STUDIES: DG Chest 2 View  Result Date: 08/16/2022 CLINICAL DATA:  Lung nodule. History of fall today. Prior mediastinal mass removed 08/05/2022 EXAM: CHEST - 2 VIEW COMPARISON:  X-ray 08/07/2022 and older FINDINGS: Improved contours of the mediastinum with less bulging on the right side. Please correlate with surgical history. No consolidation, pneumothorax, effusion or edema. Normal cardiopericardial silhouette. Previous right IJ line no longer seen. Right shoulder reverse arthroplasty. Improving basilar opacities. Air-fluid level along the stomach beneath the left hemidiaphragm. IMPRESSION: No acute cardiopulmonary disease. Electronically Signed   By: Jill Side M.D.   On: 08/16/2022 11:57   DG CHEST PORT 1 VIEW  Result Date: 08/07/2022 CLINICAL DATA:  Status post robot assisted procedure. EXAM: PORTABLE CHEST 1 VIEW COMPARISON:  August 06, 2022 FINDINGS: The right central line terminates in the central SVC. No pneumothorax. Stable cardiomediastinal silhouette. Mild  atelectasis and probably a tiny effusion at the left base. No overt edema. No nodule, mass, or other abnormality. IMPRESSION: 1. Support apparatus as above. 2. Small left effusion with underlying atelectasis. 3. No other significant abnormalities or changes. Electronically Signed   By: Dorise Bullion III M.D.   On: 08/07/2022 09:43   DG Chest Port 1 View  Result Date: 08/06/2022 CLINICAL DATA:  Mediastinal mass EXAM: PORTABLE CHEST 1 VIEW COMPARISON:  August 05, 2022 FINDINGS: Stable right central line and left chest tube. No pneumothorax. The cardiomediastinal silhouette is stable. No overt edema or suspicious infiltrate. No other interval changes. IMPRESSION: Support apparatus as above. No pneumothorax. No acute abnormalities. Electronically Signed   By: Dorise Bullion III M.D.   On: 08/06/2022 10:10   DG Chest Port 1 View  Result Date: 08/05/2022 CLINICAL DATA:  Mediastinal mass EXAM: PORTABLE CHEST 1 VIEW COMPARISON:  X-ray 08/03/2022.  CT PET 07/14/2022 FINDINGS: Right IJ catheter in place with tip at the SVC right atrial junction. No right pneumothorax. Normal cardiopericardial silhouette. Tortuous and ectatic aorta. Underinflation. Bandlike opacity at the left lung base and some areas of the right upper lobe, possibly atelectatic versus infiltrate. Recommend follow-up. No effusion or edema. Overlapping cardiac leads. Right shoulder arthroplasty. Left-sided chest tube with a tiny left apical pneumothorax. IMPRESSION: Left-sided chest tube with a tiny left apical pneumothorax. Right IJ line. Subtle patchy opacity left lung base and right upper lung. Possible atelectasis versus infiltrate and recommend follow-up. Electronically Signed   By: Jill Side M.D.   On: 08/05/2022 11:29   DG Chest 2 View  Result Date: 08/03/2022 CLINICAL DATA:  Preop evaluation for mediastinal mass. EXAM: CHEST - 2 VIEW COMPARISON:  July 05, 2022 FINDINGS: The heart size and mediastinal contours are stable. Both lungs  are clear. The visualized skeletal structures are stable. IMPRESSION: No active cardiopulmonary disease. Electronically Signed   By: Abelardo Diesel M.D.   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 Roxan Hockey.   PLAN: I had a lengthy discussion with the patient and her family today about her current disease stage, prognosis and treatment options. I personally and independently reviewed the scan images and discussed the result with the patient and her family. I explained to the patient that she had surgical resection of the anterior mediastinal mass and no clear evidence for other metastatic disease but she may benefit from a course of either adjuvant radiotherapy as a single modality or a course of concurrent chemoradiation with weekly carboplatin for AUC of 2 and paclitaxel 45 Mg/M2 for around 6 weeks to the mediastinal area. The patient was seen by Dr. Lisbeth Renshaw and his planning to proceed with the radiotherapy and I will wait to hear from him regarding the need for radiosensitizing chemotherapy or not. The patient had a recent fall and pain on the left side of the chest she also has suspicious hypermetabolic right upper lobe density which could be inflammatory in origin but malignancy could not be completely excluded.  I will repeat CT scan of the chest in 1-2 weeks for further evaluation of this lesion and also to rule out any fractures after her fall. For the back pain, I will give the patient prescription for Percocet 5/325 mg every 8 hours as needed. If there is no need for radiosensitizing agent with the adjuvant radiotherapy, I will arrange for the patient to come back for follow-up visit in 4  months with repeat CT scan of the chest for restaging of her disease. The patient will come back for follow-up visit in 2-3 weeks for evaluation and discussion of her CT scan results and further recommendation regarding chemotherapy if needed. She was advised to call immediately if she has any other concerning symptoms in the interval.   The patient voices understanding of current disease status and treatment options and is in agreement with the current care plan.  All questions were answered. The patient knows to call the clinic with any problems, questions or concerns. We can certainly see the patient much sooner if necessary.  Thank you so much for allowing me to participate in the care of DAZIA LIPPOLD. I will continue to follow up the patient with you and assist in her care.  The total time spent in the appointment was 90 minutes.  Disclaimer: This note was dictated with voice recognition software. Similar sounding words can inadvertently be transcribed and may not be corrected upon review.   Eilleen Kempf August 22, 2022, 11:56 AM

## 2022-08-22 NOTE — Telephone Encounter (Signed)
Patient going to oncologist who will address fall -patient will see Dr Orvis Brill on 1/29 at 1120am -

## 2022-08-23 LAB — SURGICAL PATHOLOGY

## 2022-08-25 ENCOUNTER — Telehealth: Payer: Self-pay | Admitting: Radiation Oncology

## 2022-08-25 DIAGNOSIS — I129 Hypertensive chronic kidney disease with stage 1 through stage 4 chronic kidney disease, or unspecified chronic kidney disease: Secondary | ICD-10-CM | POA: Diagnosis not present

## 2022-08-25 DIAGNOSIS — E785 Hyperlipidemia, unspecified: Secondary | ICD-10-CM | POA: Diagnosis not present

## 2022-08-25 DIAGNOSIS — M8700 Idiopathic aseptic necrosis of unspecified bone: Secondary | ICD-10-CM | POA: Diagnosis not present

## 2022-08-25 DIAGNOSIS — N1832 Chronic kidney disease, stage 3b: Secondary | ICD-10-CM | POA: Diagnosis not present

## 2022-08-25 NOTE — Telephone Encounter (Signed)
The patient called back and would like to hold off on simulation a little bit longer because she is still having pain laying flat from her fall a few weeks ago.  She states that she would like to have clarity on her CT scan results of the chest as well before making any definitive plans but is in agreement with radiation.  I asked our staff to touch base with her next week to see how she is feeling about moving forward.

## 2022-08-25 NOTE — Telephone Encounter (Signed)
I called and left a voicemail for the patient and communicated discussion from thoracic oncology conference and asked her to call back to discuss. Recommendations are for radiation to the chest over 6 weeks without plans for chemotherapy. I placed simulation orders and asked our staff to call to coordinate simulation.

## 2022-08-26 ENCOUNTER — Other Ambulatory Visit: Payer: Self-pay

## 2022-08-26 ENCOUNTER — Ambulatory Visit (HOSPITAL_COMMUNITY)
Admission: RE | Admit: 2022-08-26 | Discharge: 2022-08-26 | Disposition: A | Discharge status: Home / Self Care | Payer: Medicare Other | Source: Ambulatory Visit | Attending: Internal Medicine | Admitting: Internal Medicine

## 2022-08-26 DIAGNOSIS — R918 Other nonspecific abnormal finding of lung field: Secondary | ICD-10-CM | POA: Diagnosis not present

## 2022-08-26 DIAGNOSIS — C3490 Malignant neoplasm of unspecified part of unspecified bronchus or lung: Secondary | ICD-10-CM | POA: Diagnosis not present

## 2022-08-26 DIAGNOSIS — J90 Pleural effusion, not elsewhere classified: Secondary | ICD-10-CM | POA: Diagnosis not present

## 2022-08-26 NOTE — Progress Notes (Signed)
The proposed treatment discussed in conference is for discussion purpose only and is not a binding recommendation.  The patients have not been physically examined, or presented with their treatment options.  Therefore, final treatment plans cannot be decided.  

## 2022-08-27 LAB — LAB REPORT - SCANNED
Albumin, Urine POC: <3
Albumin/Creatinine Ratio, Urine, POC: <16
Creatinine, POC: 18.7 mg/dL

## 2022-08-29 ENCOUNTER — Encounter: Payer: Self-pay | Admitting: Family

## 2022-08-29 ENCOUNTER — Telehealth: Payer: Self-pay | Admitting: Medical Oncology

## 2022-08-29 ENCOUNTER — Ambulatory Visit (INDEPENDENT_AMBULATORY_CARE_PROVIDER_SITE_OTHER): Payer: Medicare Other | Admitting: Family

## 2022-08-29 ENCOUNTER — Telehealth: Payer: Self-pay | Admitting: Radiation Oncology

## 2022-08-29 ENCOUNTER — Telehealth: Payer: Self-pay

## 2022-08-29 VITALS — BP 125/85 | HR 73 | Temp 97.8°F | Ht 64.0 in | Wt 146.4 lb

## 2022-08-29 DIAGNOSIS — S2249XA Multiple fractures of ribs, unspecified side, initial encounter for closed fracture: Secondary | ICD-10-CM | POA: Diagnosis not present

## 2022-08-29 NOTE — Telephone Encounter (Signed)
I spoke with the patient after following up with her CT scan. She has inflammatory changes that radiology feels need to be followed closely but no active concerns for additional developing malignancy in the chest. She has postoperative changes and at least 3 fractrured left posterior ribs. It is likely these are from her recent fall. We discussed increasing gabapentin to TID from her current BID dosing to see if this helps. She is in agreement. She will be contacted by our office as well to schedule simulation. We expect to start next week with radiation at the earliest.

## 2022-08-29 NOTE — Telephone Encounter (Signed)
done

## 2022-08-29 NOTE — Telephone Encounter (Signed)
This nurse received a call report for patients CT of Chest without Contrast.  Provider is aware of results.  No further concerns at this time.

## 2022-08-29 NOTE — Progress Notes (Signed)
Patient ID: Sandra Craig, female    DOB: 31-Aug-1940, 82 y.o.   MRN: [id number]  Chief Complaint  Patient presents with   Follow-up    Pt states she had on a fall on 1/16, Pt hurt her back. CT scan was done in ED and she has 3 broken ribs. Slipped and fell over the tub from a wet floor at home. Has tried oxycodone and tylenol which does help pain. Stopped Oxycodone yesterday.     HPI:      Rib fractures:  fell at home on 1/16, did called Dr. Roxan Hockey on call & had CXR done at [facility] imaging but no fx found, then seen by oncologist for routine CT scan on 1/26, s/p recent lung laparoscopic sgy -  and was given OXY RX at that time. Pt has taken 1 pill q12h as her pain is lessening, as well as Tylenol prn. Per CT scan she read that she did fx 9,10 & 11 ribs on left side. Has not had a call regarding the scan yet.   Assessment & Plan:  1. Closed fracture of multiple ribs, unspecified laterality, initial encounter - per CT scan, fx in 9th,10th, & 11th posterior left ribs, with slight effusion. Pt  also s/p thoracoscopy on 1/5,  denies any SOB or cough, pain is managed with Oxy 56m q12h and Tylenol. Advised on continued POC with current meds, continue using IS 3-4 x/day, can also try OTC topical Lidocaine patches, alternate ice or heat for up to 320m tid. Call back if any worsening of pain, new sx mentioned above, or fever noted.   Subjective:    Outpatient Medications Prior to Visit  Medication Sig Dispense Refill   aspirin 81 MG tablet Take 1 tablet (81 mg total) by mouth daily. 30 tablet    atorvastatin (LIPITOR) 40 MG tablet Take 1 tablet (40 mg total) by mouth daily. 90 tablet 3   Calcium Citrate-Vitamin D (CALCIUM + D PO) Take 1 tablet by mouth 2 (two) times daily.     DULoxetine (CYMBALTA) 30 MG capsule TAKE ONE CAPSULE BY MOUTH TWICE DAILY 180 capsule 3   famotidine (PEPCID) 20 MG tablet TAKE 1 TABLET(20 MG) BY MOUTH TWICE DAILY 180 tablet 1   gabapentin (NEURONTIN) 300 MG  capsule TAKE 1 CAPSULE(300 MG) BY MOUTH TWICE DAILY AS NEEDED FOR PAIN (Patient taking differently: Take 300 mg by mouth 2 (two) times daily.) 180 capsule 3   losartan (COZAAR) 25 MG tablet Take 12.5 mg by mouth daily.     Multiple Vitamins-Minerals (MULTIVITAMIN WITH MINERALS) tablet Take 1 tablet by mouth daily.     Polyethyl Glycol-Propyl Glycol (SYSTANE) 0.4-0.3 % GEL ophthalmic gel Place 1 application into both eyes 3 (three) times daily as needed (dry eye).     zolpidem (AMBIEN) 5 MG tablet TAKE 1 TABLET BY MOUTH EVERY DAY AT BEDTIME FOR SLEEP (Patient taking differently: Take 5 mg by mouth at bedtime.) 90 tablet 1   oxyCODONE-acetaminophen (PERCOCET/ROXICET) 5-325 MG tablet Take 1 tablet by mouth every 8 (eight) hours as needed for severe pain. (Patient not taking: Reported on 08/29/2022) 30 tablet 0   No facility-administered medications prior to visit.   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   Chronic insomnia    Chronic kidney disease    Cystitis    Depression    Diverticulitis of intestine without perforation or abscess without  bleeding    Patient did have abscess but noperforation   Diverticulosis of colon (without mention of hemorrhage)    Endometriosis    Family history of malignant neoplasm of gastrointestinal tract    Fibromyalgia    Gastritis    Hiatal hernia    HIATAL HERNIA 10/08/2008   Qualifier: Diagnosis of  By: Nils Pyle CMA (AAMA), Mearl Latin     History of gallstones    HSV-1 infection    Hyperlipidemia    Hypertension    IBS (irritable bowel syndrome)    IC (interstitial cystitis)    Internal hemorrhoid    Mycobacterium avium complex ([place])    history- took antibiotics and completed course   Osteonecrosis ([place])    Osteopenia    Osteoporosis    Palpitations    Polycystic kidney disease    pt denies   Pulmonary nodule 07/2007   5 mm Anterior RUL   Small bowel obstruction ([place])     Stroke ([place]) 2021   TIA   Past Surgical History:  Procedure Laterality Date   ABDOMINAL HYSTERECTOMY  1994   TAH,BSO FOR ENDOMETRIOSIS   ABDOMINAL SURGERY  2011   small intestine blockage   CHOLECYSTECTOMY     GASTROPLASTY  2011   small bowel resection -open   INGUINAL HERNIA REPAIR Right 02/28/2020   Procedure: LAPAROSCOPIC RIGHT INGUINAL HERNIA REPAIR WITH MESH;  Surgeon: Ralene Ok, MD;  Location: [place];  Service: General;  Laterality: Right;   JOINT REPLACEMENT  2008   OOPHORECTOMY  1994   TAH,BSO   PELVIC LAPAROSCOPY     S/P right shoulder rotater cuff  200216/2011   Tear/adhesive capsulitis   SBO Lap  11   Adhesions and small internal hernia   SHOULDER HEMI-ARTHROPLASTY  06/05/2012   Procedure: SHOULDER HEMI-ARTHROPLASTY;  Surgeon: Johnny Bridge, MD;  Location: [place];  Service: Orthopedics;  Laterality: Right;  FOR ARTHRITIS   TOTAL HIP ARTHROPLASTY  FALL OF 2008   rt. partial hip replacement   TOTAL SHOULDER ARTHROPLASTY  06/05/2012   Procedure: TOTAL SHOULDER ARTHROPLASTY;  Surgeon: Johnny Bridge, MD;  Location: [place];  Service: Orthopedics;  Laterality: Right;  RIGHT SHOULDER TOTAL ARTHROPLASTY, HEMIARTHROPLASTY, SHOULDER, FOR ARTHRITIS   VIDEO BRONCHOSCOPY Bilateral 01/28/2015   Procedure: VIDEO BRONCHOSCOPY WITH FLUORO;  Surgeon: Collene Gobble, MD;  Location: [place];  Service: Cardiopulmonary;  Laterality: Bilateral;   VIDEO BRONCHOSCOPY Bilateral 04/23/2019   Procedure: VIDEO BRONCHOSCOPY WITHOUT FLUORO;  Surgeon: Collene Gobble, MD;  Location: [facility] ENDOSCOPY;  Service: Cardiopulmonary;  Laterality: Bilateral;   Allergies  Allergen Reactions   Azithromycin Rash    Pruritic rash diffuse     Celecoxib Nausea Only      Objective:    Physical Exam Vitals and nursing note reviewed.  Constitutional:      Appearance: Normal appearance.  Cardiovascular:     Rate and Rhythm: Normal rate and regular rhythm.  Pulmonary:     Effort: Pulmonary effort is  normal.     Breath sounds: Normal breath sounds.  Chest:     Chest wall: Tenderness (left posterior, lower rib region) present. No swelling (no ecchymosis noted).  Musculoskeletal:        General: Normal range of motion.  Skin:    General: Skin is warm and dry.  Neurological:     Mental Status: She is alert.  Psychiatric:        Mood and Affect: Mood normal.        Behavior:  Behavior normal.    BP 125/85 (BP Location: Left Arm, Patient Position: Sitting, Cuff Size: Large)   Pulse 73   Temp 97.8 F (36.6 C) (Temporal)   Ht 5' 4"  (1.626 m)   Wt 146 lb 6 oz (66.4 kg)   LMP 08/25/1992   SpO2 95%   BMI 25.13 kg/m  Wt Readings from Last 3 Encounters:  08/29/22 146 lb 6 oz (66.4 kg)  08/22/22 148 lb 4 oz (67.2 kg)  08/18/22 149 lb 2 oz (67.6 kg)       Jeanie Sewer, NP

## 2022-08-30 ENCOUNTER — Encounter (HOSPITAL_COMMUNITY): Payer: Self-pay

## 2022-08-30 ENCOUNTER — Ambulatory Visit (HOSPITAL_COMMUNITY): Payer: Medicare Other

## 2022-08-31 ENCOUNTER — Encounter: Payer: Self-pay | Admitting: Family Medicine

## 2022-09-01 ENCOUNTER — Ambulatory Visit (INDEPENDENT_AMBULATORY_CARE_PROVIDER_SITE_OTHER): Payer: Medicare Other | Admitting: Psychology

## 2022-09-01 DIAGNOSIS — F331 Major depressive disorder, recurrent, moderate: Secondary | ICD-10-CM | POA: Diagnosis not present

## 2022-09-01 NOTE — Progress Notes (Signed)
[facility] Counselor/Therapist Progress Note  Patient ID: Sandra Craig, MRN: [id number],    Date: 09/01/2022  Time Spent: 10:00am-10:50am   50 minutes   Treatment Type: Individual Therapy  Reported Symptoms: stress  Mental Status Exam: Appearance:  Casual     Behavior: Appropriate  Motor: Normal  Speech/Language:  Normal Rate  Affect: Appropriate  Mood: normal  Thought process: normal  Thought content:   WNL  Sensory/Perceptual disturbances:   WNL  Orientation: oriented to person, place, time/date, and situation  Attention: Good  Concentration: Good  Memory: WNL  Fund of knowledge:  Good  Insight:   Good  Judgment:  Good  Impulse Control: Good   Risk Assessment: Danger to Self:  No Self-injurious Behavior: No Danger to Others: No Duty to Warn:no Physical Aggression / Violence:No  Access to Firearms a concern: No  Gang Involvement:No   Subjective:  [name] present for individual therapy via phone.  Pt consents to telehealth virtual session due to [place] 19 pandemic. Location of pt: home Location of therapist: home office.  Pt talked about her health.   Pt will have a radiation plan in the next week to treat her cancer.   Pt states she feels scared at times.   Addressed pt's fears and worries. She copes with the fear by relying on her faith.   Pt is trying to take one day at a time.  It is hard for pt to ask for help from others.  Addressed how pt can communicate her needs.   Pt talked about her husband Sandra Craig who fell last week and is not doing very well.   She has to rely on other family members.  [name]'s son Sandra Craig lives locally and helps them out a lot.   Pt talked about her relationship with her husband.  She said they are both hard headed and pt is independent.  She has realized that if she did not have [name] she would be alone through her health issues.  She has a new appreciation for her marriage.  Pt is also staying connected with her friends  who are very supportive.   Worked on coping strategies.  Worked on self care strategies.   Provided supportive therapy.    Interventions: Cognitive Behavioral Therapy and Insight-Oriented  Diagnosis: F33.1  Plan: Plan to meet in two weeks.  Pt is progressing toward treatment goals.   Plan of Care: Recommend ongoing therapy.   Pt participated in setting treatment goals.  Pt wants to have someone to talk to and to improve coping skills.  Pt wants to feel less anxious and depressed.  Plan to meet every two weeks.    Treatment Plan (Treatment Plan Target Date: 10/26/2022) Client Abilities/Strengths  Pt is bright, engaging, and motivated for therapy.   Client Treatment Preferences  Individual therapy.  Client Statement of Needs  Improve coping skills.  Symptoms  Depressed or irritable mood. Diminished interest in or enjoyment of activities. Lack of energy. Feelings of hopelessness, worthlessness, or inappropriate guilt. Unresolved grief issues.   Problems Addressed  Unipolar Depression Goals 1. Alleviate depressive symptoms and return to previous level of effective functioning. 2. Appropriately grieve the loss in order to normalize mood and to return to previously adaptive level of functioning. Objective Learn and implement behavioral strategies to overcome depression. Target Date: 2022-10-26 Frequency: Biweekly  Progress: 10 Modality: individual  Related Interventions Engage the client in "behavioral activation," increasing his/her activity level and contact with sources of reward, while  identifying processes that inhibit activation.  Use behavioral techniques such as instruction, rehearsal, role-playing, role reversal, as needed, to facilitate activity in the client's daily life; reinforce success. Assist the client in developing skills that increase the likelihood of deriving pleasure from behavioral activation (e.g., assertiveness skills, developing an exercise plan, less  internal/more external focus, increased social involvement); reinforce success. Objective Identify important people in life, past and present, and describe the quality, good and poor, of those relationships. Target Date: 2022-10-26 Frequency: Biweekly  Progress: 10 Modality: individual  Related Interventions Conduct Interpersonal Therapy beginning with the assessment of the client's "interpersonal inventory" of important past and present relationships; develop a case formulation linking depression to grief, interpersonal role disputes, role transitions, and/or interpersonal deficits). Objective Learn and implement problem-solving and decision-making skills. Target Date: 2022-10-26 Frequency: Biweekly  Progress: 10 Modality: individual  Related Interventions Conduct Problem-Solving Therapy using techniques such as psychoeducation, modeling, and role-playing to teach client problem-solving skills (i.e., defining a problem specifically, generating possible solutions, evaluating the pros and cons of each solution, selecting and implementing a plan of action, evaluating the efficacy of the plan, accepting or revising the plan); role-play application of the problem-solving skill to a real life issue. Encourage in the client the development of a positive problem orientation in which problems and solving them are viewed as a natural part of life and not something to be feared, despaired, or avoided. 3. Develop healthy interpersonal relationships that lead to the alleviation and help prevent the relapse of depression. 4. Develop healthy thinking patterns and beliefs about self, others, and the world that lead to the alleviation and help prevent the relapse of depression. 5. Recognize, accept, and cope with feelings of depression. Diagnosis F33.1  Conditions For Discharge Achievement of treatment goals and objectives   Sandra Bolder, LCSW

## 2022-09-02 ENCOUNTER — Telehealth: Payer: Self-pay | Admitting: *Deleted

## 2022-09-02 NOTE — Telephone Encounter (Signed)
Prolia insurance verification has been sent awaiting Summary of benefits  

## 2022-09-02 NOTE — Progress Notes (Unsigned)
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   PRIOR THERAPY: status post surgical resection of the anterior mediastinal lymph node under the care of Dr. Roxan Craig on 08/05/22  CURRENT THERAPY: Adjuvant radiation under the care of Dr. Lisbeth Craig. Simulation scheduled for 09/06/22.   INTERVAL HISTORY: Sandra Craig 82 y.o. female returns to the clinic today for a follow-up visit accompanied by her husband.  The patient was last seen by Dr. Julien Craig on 08/22/2022.  At that time the patient was status post surgical resection.  The plan was to be evaluated by radiation and determine if she needs chemo sensitizing treatment or radiation alone.  The patient was evaluated by radiation oncology to determine that she should proceed with adjuvant radiation without chemotherapy.  She is expected for CT simulation on 09/06/2022.   In the interval since being seen, the patient did have a repeat CT scan the chest to further evaluate any fractures after her fall and to follow-up on a hypermetabolic right upper lobe density which could be inflammatory in origin but malignancy cannot be excluded.  She had her follow-up CT scan that showed fractures of the ninth, 10th, and 11 rib after recent fall at home.  The patient slipped on the wet floor and fell over her bathtub.  Radiology feels that the inflammatory changes will need to be followed but not active concern for developing malignancy in the chest. She reports her rib pain is getting better.  She takes 650 mg of Tylenol once a day.  She is no longer taking oxycodone.  She also takes gabapentin twice a day.  She has not tried using Lidoderm patches at this time.   Since last  being seen the patient denies any other major changes in her health.  Denies any fever, chills, or night sweats.  Her weight and appetite is stable.  She denies any cough or hemoptysis.  She reports she feels like she may be more short of breath if she tries to breathe through her mouth but breathes better through her nose.  She still continues to have some hoarseness following her surgery.  She denies any nausea, vomiting, diarrhea, or constipation.  Denies any headache or visual changes. She is here for evaluation and for a more detailed discussion on her current condition and recommended treatment options.   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   Chronic insomnia    Chronic kidney disease    Cystitis    Depression    Diverticulitis of intestine without perforation or abscess without bleeding    Patient did have abscess but noperforation   Diverticulosis of colon (without mention of hemorrhage)    Endometriosis    Family history of malignant neoplasm of gastrointestinal tract    Fibromyalgia    Gastritis    Hiatal hernia    HIATAL HERNIA 10/08/2008   Qualifier: Diagnosis of  By: Sandra Craig (AAMA), Sandra Craig     History of gallstones    HSV-1 infection    Hyperlipidemia    Hypertension    IBS (irritable bowel syndrome)    IC (interstitial cystitis)  Internal hemorrhoid    Mycobacterium avium complex ([place])    history- took antibiotics and completed course   Osteonecrosis ([place])    Osteopenia    Osteoporosis    Palpitations    Polycystic kidney disease    pt denies   Pulmonary nodule 07/2007   5 mm Anterior RUL   Small bowel obstruction ([place])    Stroke ([place]) 2021   TIA    ALLERGIES:  is allergic to azithromycin and celecoxib.  MEDICATIONS:  Current Outpatient Medications  Medication Sig Dispense Refill   aspirin 81 MG tablet Take 1 tablet (81 mg total) by mouth daily. 30  tablet    atorvastatin (LIPITOR) 40 MG tablet Take 1 tablet (40 mg total) by mouth daily. 90 tablet 3   Calcium Citrate-Vitamin D (CALCIUM + D PO) Take 1 tablet by mouth 2 (two) times daily.     DULoxetine (CYMBALTA) 30 MG capsule TAKE ONE CAPSULE BY MOUTH TWICE DAILY 180 capsule 3   famotidine (PEPCID) 20 MG tablet TAKE 1 TABLET(20 MG) BY MOUTH TWICE DAILY 180 tablet 1   gabapentin (NEURONTIN) 300 MG capsule TAKE 1 CAPSULE(300 MG) BY MOUTH TWICE DAILY AS NEEDED FOR PAIN (Patient taking differently: Take 300 mg by mouth 2 (two) times daily.) 180 capsule 3   losartan (COZAAR) 25 MG tablet Take 12.5 mg by mouth daily.     Multiple Vitamins-Minerals (MULTIVITAMIN WITH MINERALS) tablet Take 1 tablet by mouth daily.     oxyCODONE-acetaminophen (PERCOCET/ROXICET) 5-325 MG tablet Take 1 tablet by mouth every 8 (eight) hours as needed for severe pain. (Patient not taking: Reported on 08/29/2022) 30 tablet 0   Polyethyl Glycol-Propyl Glycol (SYSTANE) 0.4-0.3 % GEL ophthalmic gel Place 1 application into both eyes 3 (three) times daily as needed (dry eye).     zolpidem (AMBIEN) 5 MG tablet TAKE 1 TABLET BY MOUTH EVERY DAY AT BEDTIME FOR SLEEP (Patient taking differently: Take 5 mg by mouth at bedtime.) 90 tablet 1   No current facility-administered medications for this visit.    SURGICAL HISTORY:  Past Surgical History:  Procedure Laterality Date   ABDOMINAL HYSTERECTOMY  1994   TAH,BSO FOR ENDOMETRIOSIS   ABDOMINAL SURGERY  2011   small intestine blockage   CHOLECYSTECTOMY     GASTROPLASTY  2011   small bowel resection -open   INGUINAL HERNIA REPAIR Right 02/28/2020   Procedure: LAPAROSCOPIC RIGHT INGUINAL HERNIA REPAIR WITH MESH;  Surgeon: Ralene Ok, MD;  Location: [facility];  Service: General;  Laterality: Right;   JOINT REPLACEMENT  2008   OOPHORECTOMY  1994   TAH,BSO   PELVIC LAPAROSCOPY     S/P right shoulder rotater cuff  200216/2011   Tear/adhesive capsulitis   SBO Lap  11    Adhesions and small internal hernia   SHOULDER HEMI-ARTHROPLASTY  06/05/2012   Procedure: SHOULDER HEMI-ARTHROPLASTY;  Surgeon: Johnny Bridge, MD;  Location: [facility];  Service: Orthopedics;  Laterality: Right;  FOR ARTHRITIS   TOTAL HIP ARTHROPLASTY  FALL OF 2008   rt. partial hip replacement   TOTAL SHOULDER ARTHROPLASTY  06/05/2012   Procedure: TOTAL SHOULDER ARTHROPLASTY;  Surgeon: Johnny Bridge, MD;  Location: [facility];  Service: Orthopedics;  Laterality: Right;  RIGHT SHOULDER TOTAL ARTHROPLASTY, HEMIARTHROPLASTY, SHOULDER, FOR ARTHRITIS   VIDEO BRONCHOSCOPY Bilateral 01/28/2015   Procedure: VIDEO BRONCHOSCOPY WITH FLUORO;  Surgeon: Collene Gobble, MD;  Location: [facility];  Service: Cardiopulmonary;  Laterality: Bilateral;   VIDEO BRONCHOSCOPY Bilateral 04/23/2019   Procedure: VIDEO  BRONCHOSCOPY WITHOUT FLUORO;  Surgeon: Collene Gobble, MD;  Location: [facility] ENDOSCOPY;  Service: Cardiopulmonary;  Laterality: Bilateral;    REVIEW OF SYSTEMS:   Review of Systems  Constitutional: Negative for appetite change, chills, fatigue, fever and unexpected weight change.  HENT: Negative for mouth sores, nosebleeds, sore throat and trouble swallowing.   Eyes: Negative for eye problems and icterus.  Respiratory: Positive for dyspnea on exertion.  Negative for cough, hemoptysis, and wheezing.   Cardiovascular: Positive for rib pain.  Negative for leg swelling.  Gastrointestinal: Negative for abdominal pain, constipation, diarrhea, nausea and vomiting.  Genitourinary: Negative for bladder incontinence, difficulty urinating, dysuria, frequency and hematuria.   Musculoskeletal: Negative for back pain, gait problem, neck pain and neck stiffness.  Skin: Negative for itching and rash.  Neurological: Negative for dizziness, extremity weakness, gait problem, headaches, light-headedness and seizures.  Hematological: Negative for adenopathy. Does not bruise/bleed easily.  Psychiatric/Behavioral: Negative for  confusion, depression and sleep disturbance. The patient is not nervous/anxious.     PHYSICAL EXAMINATION:  Last menstrual period 08/25/1992.  ECOG PERFORMANCE STATUS: 1  Physical Exam  Constitutional: Oriented to person, place, and time and well-developed, well-nourished, and in no distress.  HENT:  Head: Normocephalic and atraumatic.  Mouth/Throat: Oropharynx is clear and moist. No oropharyngeal exudate.  Eyes: Conjunctivae are normal. Right eye exhibits no discharge. Left eye exhibits no discharge. No scleral icterus.  Neck: Normal range of motion. Neck supple.  Cardiovascular: Normal rate, regular rhythm, normal heart sounds and intact distal pulses.   Pulmonary/Chest: Effort normal and breath sounds normal. No respiratory distress. No wheezes. No rales.  Abdominal: Soft. Bowel sounds are normal. Exhibits no distension and no mass. There is no tenderness.  Musculoskeletal: Normal range of motion. Exhibits no edema.  Lymphadenopathy:    No cervical adenopathy.  Neurological: Alert and oriented to person, place, and time. Exhibits normal muscle tone. Gait normal. Coordination normal.  Skin: Skin is warm and dry. No rash noted. Not diaphoretic. No erythema. No pallor.  Psychiatric: Mood, memory and judgment normal.  Vitals reviewed.  LABORATORY DATA: Lab Results  Component Value Date   WBC 6.8 08/22/2022   HGB 10.8 (L) 08/22/2022   HCT 34.1 (L) 08/22/2022   MCV 92.7 08/22/2022   PLT 270 08/22/2022      Chemistry      Component Value Date/Time   NA 141 08/22/2022 1114   NA 140 02/10/2022 0000   K 4.2 08/22/2022 1114   CL 104 08/22/2022 1114   CO2 33 (H) 08/22/2022 1114   BUN 25 (H) 08/22/2022 1114   BUN 24 (A) 02/10/2022 0000   CREATININE 1.63 (H) 08/22/2022 1114   CREATININE 1.61 (H) 07/21/2020 1559   GLU 90 02/10/2022 0000      Component Value Date/Time   CALCIUM 9.6 08/22/2022 1114   CALCIUM 10.3 12/21/2009 2250   ALKPHOS 129 (H) 08/22/2022 1114   AST 34  08/22/2022 1114   ALT 37 08/22/2022 1114   BILITOT 0.4 08/22/2022 1114       RADIOGRAPHIC STUDIES:  CT Chest Wo Contrast  Result Date: 08/26/2022 CLINICAL DATA:  Follow-up non-small cell lung cancer. Found to have a mediastinal mass. * Tracking Code: BO * EXAM: CT CHEST WITHOUT CONTRAST TECHNIQUE: Multidetector CT imaging of the chest was performed following the standard protocol without IV contrast. RADIATION DOSE REDUCTION: This exam was performed according to the departmental dose-optimization program which includes automated exposure control, adjustment of the mA and/or kV according to patient  size and/or use of iterative reconstruction technique. COMPARISON:  July 03, 2022 FINDINGS: Cardiovascular: Calcified aortic atherosclerotic changes. Calcified coronary artery disease. Normal heart size. Subtle irregularity along the anterior pericardium (image 79/2. Small nodular area measuring 5 mm this image. Small amount of pericardial fluid. Central pulmonary vasculature is normal caliber. Limited assessment of cardiovascular structures given lack of intravenous contrast. Mediastinum/Nodes: Resection of a large mediastinal mass that was seen previously. No gas mediastinum. Lenticular area along the RIGHT margin of the mediastinum measures water density at 2.3 x 0.9 cm (image 85/2 measuring 12 mm thickness on image 90/2 showing 20 Hounsfield unit density. Middle mediastinal area measuring 13 mm short axis 22 Hounsfield units. Esophagus is grossly normal. No axillary or thoracic inlet lymphadenopathy. Lungs/Pleura: Bronchiectatic changes in the RIGHT upper lobe and areas of nodularity largest on image 42/5 measuring 9 mm, stable compared to recent imaging accounting for artifact in this area more pronounced on the prior study than on the current exam. Grouped nodules also seen in the LEFT chest the LEFT upper lobe with similar appearance in the lateral LEFT upper lobe slight improvement with respect to  scattered nodules in the medial LEFT upper lobe. Small LEFT-sided pleural effusion is slightly increased remaining small volume. Airways are patent. No pneumothorax. Mild scarring in the RIGHT middle lobe. Upper Abdomen: Incidental imaging of upper abdominal contents shows no acute process. Post cholecystectomy. Musculoskeletal: No chest wall mass. Post RIGHT shoulder arthroplasty with streak artifact. Irregular area along the LEFT posterior tenth rib with signs of rib fracture cortical lucency. The mildly displaced LEFT posterior eleventh rib fracture as well thinning of the LEFT posterior tenth rib was present in hind site prior. This area showed no increased metabolic activity on the prior PET changes likely reflect chronic findings of bony remodeling perhaps related to prior trauma. Also with mildly displaced fracture of the LEFT posterolateral ninth rib. IMPRESSION: Fluid density and near fluid density areas the mediastinum may reflect postoperative change. Pretracheal area could reflect some distension a pericardial recess but anterior mediastinal area is somewhat atypical for this possibility and was not seen previously. Consider short interval follow-up ensure continued resolution. Mildly nodular areas along the anterior pericardial surface following resection anterior, given recent surgery these findings are uncertain significance. Post infectious and inflammatory changes with scattered nodules based on history warrant close attention subsequent dominant nodule upper lobe mm is unchanged. Slight increase in LEFT-sided pleural effusion. Fractures of ninth, tenth and eleventh ribs posterolaterally may reflect to recent surgery. Electronically Signed   By: Zetta Bills M.D.   On: 08/26/2022 16:06   DG Chest 2 View  Result Date: 08/16/2022 CLINICAL DATA:  Lung nodule. History of fall today. Prior mediastinal mass removed 08/05/2022 EXAM: CHEST - 2 VIEW COMPARISON:  X-ray 08/07/2022 and older FINDINGS:  Improved contours of the mediastinum with less bulging on the right side. Please correlate with surgical history. No consolidation, pneumothorax, effusion or edema. Normal cardiopericardial silhouette. Previous right IJ line no longer seen. Right shoulder reverse arthroplasty. Improving basilar opacities. Air-fluid level along the stomach beneath the left hemidiaphragm. IMPRESSION: No acute cardiopulmonary disease. Electronically Signed   By: Jill Side M.D.   On: 08/16/2022 11:57   DG CHEST PORT 1 VIEW  Result Date: 08/07/2022 CLINICAL DATA:  Status post robot assisted procedure. EXAM: PORTABLE CHEST 1 VIEW COMPARISON:  August 06, 2022 FINDINGS: The right central line terminates in the central SVC. No pneumothorax. Stable cardiomediastinal silhouette. Mild atelectasis and probably a tiny effusion  at the left base. No overt edema. No nodule, mass, or other abnormality. IMPRESSION: 1. Support apparatus as above. 2. Small left effusion with underlying atelectasis. 3. No other significant abnormalities or changes. Electronically Signed   By: Dorise Bullion III M.D.   On: 08/07/2022 09:43   DG Chest Port 1 View  Result Date: 08/06/2022 CLINICAL DATA:  Mediastinal mass EXAM: PORTABLE CHEST 1 VIEW COMPARISON:  August 05, 2022 FINDINGS: Stable right central line and left chest tube. No pneumothorax. The cardiomediastinal silhouette is stable. No overt edema or suspicious infiltrate. No other interval changes. IMPRESSION: Support apparatus as above. No pneumothorax. No acute abnormalities. Electronically Signed   By: Dorise Bullion III M.D.   On: 08/06/2022 10:10   DG Chest Port 1 View  Result Date: 08/05/2022 CLINICAL DATA:  Mediastinal mass EXAM: PORTABLE CHEST 1 VIEW COMPARISON:  X-ray 08/03/2022.  CT PET 07/14/2022 FINDINGS: Right IJ catheter in place with tip at the SVC right atrial junction. No right pneumothorax. Normal cardiopericardial silhouette. Tortuous and ectatic aorta. Underinflation. Bandlike  opacity at the left lung base and some areas of the right upper lobe, possibly atelectatic versus infiltrate. Recommend follow-up. No effusion or edema. Overlapping cardiac leads. Right shoulder arthroplasty. Left-sided chest tube with a tiny left apical pneumothorax. IMPRESSION: Left-sided chest tube with a tiny left apical pneumothorax. Right IJ line. Subtle patchy opacity left lung base and right upper lung. Possible atelectasis versus infiltrate and recommend follow-up. Electronically Signed   By: Jill Side M.D.   On: 08/05/2022 11:29   DG Chest 2 View  Result Date: 08/03/2022 CLINICAL DATA:  Preop evaluation for mediastinal mass. EXAM: CHEST - 2 VIEW COMPARISON:  July 05, 2022 FINDINGS: The heart size and mediastinal contours are stable. Both lungs are clear. The visualized skeletal structures are stable. IMPRESSION: No active cardiopulmonary disease. Electronically Signed   By: Abelardo Diesel M.D.   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  She presented mediastinal lymph node metastasis diagnosed in January 2024 and status post surgery restriction of the anterior mediastinal lymph node/thymic carcinoma under the care of Dr. Roxan Craig.  She was diagnosed in January 2024.   The patient met with radiation oncology who are planning 6-week course of adjuvant radiation without chemotherapy.  CT simulation is scheduled for tomorrow and 09/06/2022.  The patient was seen with Dr. Julien Craig today.  Dr. Julien Craig recommends that we see her back for follow-up visit in 4 months with a repeat CT scan of the chest at that time.  Recent CT scan showed some postinfectious/inflammatory changes which will be monitored closely on subsequent imaging studies.   She is scheduled to follow with Dr. Roxan Craig for the postoperative  follow-up and changes on imaging with Dr. Roxan Craig later this month.  For the rib fractures, she will continue with gabapentin and Tylenol.  She has Percocet if needed and she will try using Lidoderm patches if needed.  We will see her back for follow-up visit in 4 months after her CT scan.  I have ordered this without contrast due to her renal insufficiency and dye allergy.  The patient was advised to call immediately if she has any concerning symptoms in the interval. The patient voices understanding of current disease status and treatment options and is in agreement with the current  care plan. All questions were answered. The patient knows to call the clinic with any problems, questions or concerns. We can certainly see the patient much sooner if necessary       No orders of the defined types were placed in this encounter.     Nairi Oswald L Ledarrius Beauchaine, PA-C 09/02/22  ADDENDUM: Hematology/Oncology Attending: I had a face-to-face encounter with the patient today.  I reviewed her record, lab as well as treatment plan. 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  Status post resection with close margin. The patient was seen by Dr. Lisbeth Craig and he is considering for adjuvant course of radiotherapy.  There is no need for concurrent chemotherapy in this setting based on the discussion with the radiation oncology. I recommended for the patient to proceed with her radiotherapy with Dr. Lisbeth Craig as planned. I will arrange for her a follow-up appointment with me in around 4 months for evaluation with repeat CT scan of the chest for restaging of her disease. She was advised to call immediately if she has any other concerning symptoms in the interval. The total time spent in the appointment was 20 minutes.  Disclaimer: This note was dictated with voice recognition software. Similar sounding words can inadvertently be transcribed and may be missed  upon review. Eilleen Kempf, MD

## 2022-09-05 ENCOUNTER — Inpatient Hospital Stay: Payer: Medicare Other | Attending: Physician Assistant | Admitting: Physician Assistant

## 2022-09-05 VITALS — BP 133/79 | HR 86 | Temp 98.0°F | Resp 14 | Wt 147.1 lb

## 2022-09-05 DIAGNOSIS — C7989 Secondary malignant neoplasm of other specified sites: Secondary | ICD-10-CM | POA: Insufficient documentation

## 2022-09-05 DIAGNOSIS — C801 Malignant (primary) neoplasm, unspecified: Secondary | ICD-10-CM | POA: Diagnosis not present

## 2022-09-05 DIAGNOSIS — C37 Malignant neoplasm of thymus: Secondary | ICD-10-CM

## 2022-09-05 DIAGNOSIS — C771 Secondary and unspecified malignant neoplasm of intrathoracic lymph nodes: Secondary | ICD-10-CM | POA: Insufficient documentation

## 2022-09-05 DIAGNOSIS — Z79899 Other long term (current) drug therapy: Secondary | ICD-10-CM | POA: Insufficient documentation

## 2022-09-06 ENCOUNTER — Ambulatory Visit
Admission: RE | Admit: 2022-09-06 | Discharge: 2022-09-06 | Disposition: A | Discharge status: Home / Self Care | Payer: Medicare Other | Source: Ambulatory Visit | Attending: Radiation Oncology | Admitting: Radiation Oncology

## 2022-09-06 DIAGNOSIS — Z510 Encounter for antineoplastic radiation therapy: Secondary | ICD-10-CM | POA: Insufficient documentation

## 2022-09-06 DIAGNOSIS — C37 Malignant neoplasm of thymus: Secondary | ICD-10-CM | POA: Insufficient documentation

## 2022-09-06 NOTE — Telephone Encounter (Signed)
New insurance submitted  [employer] insurance verification has been sent awaiting Summary of benefits

## 2022-09-09 ENCOUNTER — Telehealth (INDEPENDENT_AMBULATORY_CARE_PROVIDER_SITE_OTHER): Payer: Medicare Other | Admitting: Family Medicine

## 2022-09-09 ENCOUNTER — Encounter: Payer: Self-pay | Admitting: Family Medicine

## 2022-09-09 VITALS — Ht 64.0 in | Wt 140.0 lb

## 2022-09-09 DIAGNOSIS — D649 Anemia, unspecified: Secondary | ICD-10-CM | POA: Diagnosis not present

## 2022-09-09 DIAGNOSIS — C37 Malignant neoplasm of thymus: Secondary | ICD-10-CM | POA: Diagnosis not present

## 2022-09-09 DIAGNOSIS — I10 Essential (primary) hypertension: Secondary | ICD-10-CM | POA: Diagnosis not present

## 2022-09-09 NOTE — Progress Notes (Signed)
Phone [phone] Virtual visit via Video note   Subjective:  Chief complaint: Chief Complaint  Patient presents with   iron infusions    Pt is wanting to discuss possible iron infusions.   This visit type was conducted due to national recommendations for restrictions regarding the COVID-19 Pandemic (e.g. social distancing).  This format is felt to be most appropriate for this patient at this time balancing risks to patient and risks to population by having him in for in person visit.  No physical exam was performed (except for noted visual exam or audio findings with Telehealth visits).    Our team/I connected with Sandra Craig at  2:00 PM EST by a video enabled telemedicine application (doxy.me or caregility through epic) and verified that I am speaking with the correct person using two identifiers.  Location patient: Home-[place] Location provider: [facility], office Persons participating in the virtual visit:  patient  Our team/I discussed the limitations of evaluation and management by telemedicine and the availability of in person appointments. In light of current covid-19 pandemic, patient also understands that we are trying to protect them by minimizing in office contact if at all possible.  The patient expressed consent for telemedicine visit and agreed to proceed. Patient understands insurance will be billed.   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    Priority: High   Chronic low back pain 12/20/2013    Priority: High   Fibromyalgia 12/05/2007    Priority: High   UTI (urinary tract infection) 12/15/2018    Priority: Medium    CKD (chronic kidney disease), stage IIIb 06/02/2017    Priority: Medium    Near syncope 03/11/2017     Priority: Medium    Hyperglycemia 06/27/2012    Priority: Medium    History of small bowel obstruction 06/28/2011    Priority: Medium    GERD with stricture 06/28/2011    Priority: Medium    Hypertension     Priority: Medium    Hyperlipidemia     Priority: Medium    INSOMNIA, CHRONIC 10/08/2008    Priority: Medium    Depression 02/08/2008    Priority: Medium    Facet arthropathy 09/14/2019    Priority: Low   Vaginal dryness 01/29/2019    Priority: Low   Chronic right shoulder pain 03/07/2014    Priority: Low   Benign essential tremor 10/09/2013    Priority: Low   Diverticulitis large intestine 02/11/2012    Priority: Low   Irritable bowel syndrome (IBS) 06/28/2011    Priority: Low   IC (interstitial cystitis)     Priority: Low   Hemorrhoids 10/08/2008    Priority: Low   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 30 tablet    atorvastatin (LIPITOR) 40 MG tablet Take 1 tablet (40 mg total) by mouth daily. Reform  tablet 3   Calcium Citrate-Vitamin D (CALCIUM + D PO) Take 1 tablet by mouth 2 (two) times daily.     DULoxetine (CYMBALTA) 30 MG capsule TAKE ONE CAPSULE BY MOUTH TWICE DAILY 180 capsule 3   famotidine (PEPCID) 20 MG tablet TAKE 1 TABLET(20 MG) BY MOUTH TWICE DAILY 180 tablet 1   gabapentin (NEURONTIN) 300 MG capsule TAKE 1 CAPSULE(300 MG) BY MOUTH TWICE DAILY AS NEEDED FOR PAIN (Patient taking differently: Take 300 mg by mouth 2 (two) times daily.) 180 capsule 3   losartan (COZAAR) 25 MG tablet Take 12.5 mg by mouth daily.     Multiple Vitamins-Minerals (MULTIVITAMIN WITH MINERALS) tablet  Take 1 tablet by mouth daily.     Polyethyl Glycol-Propyl Glycol (SYSTANE) 0.4-0.3 % GEL ophthalmic gel Place 1 application into both eyes 3 (three) times daily as needed (dry eye).     zolpidem (AMBIEN) 5 MG tablet TAKE 1 TABLET BY MOUTH EVERY DAY AT BEDTIME FOR SLEEP (Patient taking differently: Take 5 mg by mouth at bedtime.) 90 tablet 1   oxyCODONE-acetaminophen (PERCOCET/ROXICET) 5-325 MG tablet Take 1 tablet by mouth every 8 (eight) hours as needed for severe pain. (Patient not taking: Reported on 08/29/2022) 30 tablet 0   No current facility-administered medications for this visit.     Objective:  Ht 5\' 4"  (1.626 m)   Wt 140 lb (63.5 kg)   LMP 08/25/1992   BMI 24.03 kg/m  self reported vitals Gen: NAD, resting comfortably Lungs: nonlabored, normal respiratory rate  Skin: appears dry, no obvious rash     Assessment and Plan   # Malignant Neoplasm of Thymus # Anemia # Hoarseness S:upcoming radiation and is worried about her throat being sore after this (radiology told her this is a potential concern).  She is particularly concerned that she may not be able to have a healthy enough diet to get adequate iron and have worsening anemia/weakness and she wants to be proactive.  She reports iron infusions years ago  Hoarseness since January 5th since biopsy/removal of tumor/thymus A/P: Malignant neoplasm of the thymus-status post appropriate surgical intervention and pending radiation though she does not have chemotherapy at this point per notes  After surgery has had some hoarseness and plans to call Dr. Redmond Baseman at [facility] to get established and also help her on an ongoing basis with the upcoming radiation treatments for which she anticipates sore throat-we can place referral if needed as radiation department has informed her she will experience  In regards to anemia she would like to update her CBC and iron stores/ferritin to further evaluate and see if she might be candidate for  iron infusions again if iron deficiency anemia noted  #hypertension S: medication: half losartan 25 mg so 12.5 mg BP Readings from Last 3 Encounters:  09/05/22 133/79  08/29/22 125/85  08/22/22 (!) 183/88  A/P: Reasonable control-continue current medication.  History of orthostasis so we need to be cautious about increasing dose and will avoid at this time  Recommended follow up: We opted to keep May visit Future Appointments  Date Time Provider [place]  09/13/2022  4:15 PM Kyung Rudd, MD [facility] None  09/14/2022  4:15 PM [facility] LIN[facility] 4 [facility] None  09/15/2022  8:45 AM [facility] LINAC 3 [facility] None  09/15/2022  2:00 PM Bauert, Nicolasa Ducking, LCSW [facility] None  09/16/2022  8:45 AM [facility] LINAC 3 [facility] None  09/19/2022  4:00 PM [facility] LINAC 4 [facility] None  09/20/2022 11:00 AM [place] Charon  C, MD [facility]  09/20/2022  1:30 PM [facility] LINAC 4 [facility] None  09/21/2022  3:30 PM [facility] LINAC 4 [facility] None  09/22/2022  4:00 PM [facility] LINAC 4 [facility] None  09/23/2022  3:30 PM [facility] LINAC 4 [facility] None  09/26/2022  3:30 PM [facility] LINAC 4 [facility] None  09/27/2022  3:30 PM [facility] LINAC 4 [facility] None  09/28/2022  3:30 PM [facility] LINAC 4 [facility] None  09/29/2022 10:00 AM Bauert, Terri W, LCSW [facility] None  09/29/2022  3:30 PM [facility] LINAC 4 [facility] None  09/30/2022  3:30 PM [facility] LINAC 4 [facility] None  10/03/2022  3:30 PM [facility] LINAC 4 [facility] None  10/04/2022  3:30 PM [facility] LINAC 4 [facility] None  10/05/2022  3:30 PM [facility] LINAC 4 [facility] None  10/06/2022  3:30 PM [facility] LINAC 4 [facility] None  10/07/2022  3:30 PM [facility] LINAC 4 [facility] None  10/10/2022  3:30 PM [facility] LINAC 4 [facility] None  10/11/2022 11:00 AM Bauert, Terri W, LCSW [facility] None  10/11/2022  3:30 PM [facility] LINAC 4 [facility] None  10/12/2022  3:30 PM  [facility] LINAC 4 [facility] None  10/13/2022  3:30 PM [facility] LINAC 4 [facility] None  10/14/2022  3:30 PM [facility] LINAC 4 [facility] None  10/17/2022  3:30 PM [facility] LINAC 4 [facility] None  10/18/2022  3:30 PM [facility] LINAC 4 [facility] None  10/19/2022  3:30 PM [facility] LINAC 4 [facility] None  10/20/2022  3:30 PM [facility] LINAC 4 [facility] None  10/21/2022  3:30 PM [facility] LINAC 4 [facility] None  10/24/2022  1:00 PM Davis, Christian L, RPH [facility] None  10/24/2022  4:15 PM [facility] LINAC 4 [facility] None  10/27/2022 11:00 AM Bauert, Terri W, LCSW [facility] None  11/30/2022  9:00 AM [name], MD [facility] PEC  12/29/2022 10:30 AM [facility] HEALTH COACH [facility]  12/30/2022 11:00 AM [facility] LAB [facility] None  01/03/2023 11:00 AM Curt Bears, MD [facility] None    Lab/Order associations:   ICD-10-CM   1. Anemia, unspecified type  D64.9 CBC with Differential/Platelet    IBC + Ferritin    CANCELED: IBC + Ferritin    CANCELED: CBC with Differential/Platelet    2. 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 Primary hypertension  I10       Return precautions advised.  Garret Reddish, MD

## 2022-09-11 DIAGNOSIS — K047 Periapical abscess without sinus: Secondary | ICD-10-CM | POA: Diagnosis not present

## 2022-09-12 ENCOUNTER — Ambulatory Visit: Payer: Medicare Other | Admitting: Radiation Oncology

## 2022-09-12 ENCOUNTER — Telehealth: Payer: Self-pay | Admitting: Radiation Oncology

## 2022-09-12 DIAGNOSIS — Z510 Encounter for antineoplastic radiation therapy: Secondary | ICD-10-CM | POA: Diagnosis not present

## 2022-09-12 DIAGNOSIS — C37 Malignant neoplasm of thymus: Secondary | ICD-10-CM | POA: Diagnosis not present

## 2022-09-12 NOTE — Telephone Encounter (Signed)
The pt's husband called back. She's had a tooth abscess and had to switch antibiotics over the weekend after an urgent care visit. She will follow up with her dentist today. We discussed this should not interfere with her ability to have radiation. I gave her the option to delay starting til later in the week, but at this time she'd like to keep her schedule as is.

## 2022-09-12 NOTE — Telephone Encounter (Signed)
Pt called and had questions about her treatment that starts tomorrow. I called her back and had to leave a message.

## 2022-09-13 ENCOUNTER — Ambulatory Visit: Payer: Medicare Other | Admitting: Radiation Oncology

## 2022-09-13 ENCOUNTER — Telehealth: Payer: Medicare Other | Admitting: Family Medicine

## 2022-09-13 ENCOUNTER — Ambulatory Visit: Payer: Medicare Other

## 2022-09-13 ENCOUNTER — Other Ambulatory Visit: Payer: Self-pay | Admitting: Family Medicine

## 2022-09-13 DIAGNOSIS — K080 Exfoliation of teeth due to systemic causes: Secondary | ICD-10-CM | POA: Diagnosis not present

## 2022-09-13 DIAGNOSIS — K047 Periapical abscess without sinus: Secondary | ICD-10-CM | POA: Diagnosis not present

## 2022-09-14 ENCOUNTER — Ambulatory Visit
Admission: RE | Admit: 2022-09-14 | Discharge: 2022-09-14 | Disposition: A | Discharge status: Home / Self Care | Payer: Medicare Other | Source: Ambulatory Visit | Attending: Radiation Oncology | Admitting: Radiation Oncology

## 2022-09-14 ENCOUNTER — Other Ambulatory Visit: Payer: Self-pay

## 2022-09-14 ENCOUNTER — Encounter: Payer: Self-pay | Admitting: Radiation Oncology

## 2022-09-14 DIAGNOSIS — Z510 Encounter for antineoplastic radiation therapy: Secondary | ICD-10-CM | POA: Diagnosis not present

## 2022-09-14 DIAGNOSIS — C37 Malignant neoplasm of thymus: Secondary | ICD-10-CM | POA: Diagnosis not present

## 2022-09-14 LAB — RAD ONC ARIA SESSION SUMMARY
Course Elapsed Days: 0
Plan Fractions Treated to Date: 1
Plan Prescribed Dose Per Fraction: 1.8 Gy
Plan Total Fractions Prescribed: 30
Plan Total Prescribed Dose: 54 Gy
Reference Point Dosage Given to Date: 1.8 Gy
Reference Point Session Dosage Given: 1.8 Gy
Session Number: 1

## 2022-09-15 ENCOUNTER — Ambulatory Visit (INDEPENDENT_AMBULATORY_CARE_PROVIDER_SITE_OTHER): Payer: Medicare Other | Admitting: Psychology

## 2022-09-15 ENCOUNTER — Other Ambulatory Visit: Payer: Self-pay

## 2022-09-15 ENCOUNTER — Ambulatory Visit
Admission: RE | Admit: 2022-09-15 | Discharge: 2022-09-15 | Disposition: A | Discharge status: Home / Self Care | Payer: Medicare Other | Source: Ambulatory Visit | Attending: Radiation Oncology | Admitting: Radiation Oncology

## 2022-09-15 DIAGNOSIS — Z510 Encounter for antineoplastic radiation therapy: Secondary | ICD-10-CM | POA: Diagnosis not present

## 2022-09-15 DIAGNOSIS — F331 Major depressive disorder, recurrent, moderate: Secondary | ICD-10-CM | POA: Diagnosis not present

## 2022-09-15 DIAGNOSIS — C37 Malignant neoplasm of thymus: Secondary | ICD-10-CM | POA: Diagnosis not present

## 2022-09-15 LAB — RAD ONC ARIA SESSION SUMMARY
Course Elapsed Days: 1
Plan Fractions Treated to Date: 2
Plan Prescribed Dose Per Fraction: 1.8 Gy
Plan Total Fractions Prescribed: 30
Plan Total Prescribed Dose: 54 Gy
Reference Point Dosage Given to Date: 3.6 Gy
Reference Point Session Dosage Given: 1.8 Gy
Session Number: 2

## 2022-09-15 NOTE — Progress Notes (Signed)
Sandra Craig is a pleasant 82 y.o. female with a history of thymic carcinoma s/p resection with concerns for margins who is planning to start adjuvant radiotherapy to the chest today. She had pretreatment CT imaging that showed postoperative changes including some mild fluid density and nodulary areas along the anterior pericardial surface. She also just had a tooth extraction due to abscess yesterday.   She presented to the clinic before her treatment was to begin today with complaints of shortness of breath and chest pain. She asked to be placed on O2, despite O2 sats on room air at 100%. Her BP was 157/72, Temp 98.3 F, RR 19, and Pulse 83.   Her symptoms began as she was walking into the cancer center. With deep breathing her symptoms resolved. With brief assessment she is a non toxic  appearing caucasian female in no acute distress. She's alert and oriented x4 and appropriate throughout the examination. Cardiopulmonary assessment is negative for acute distress and she exhibits normal effort.   We discussed that given her stable vitals there is no utlity for O2, so this was removed, and she felt well enough to proceed with treatment today. We will let her know if there are any concerns with her cone beam images taken on the table prior to her therapy. Otherwise if her symptoms return she was instructed to go to the ER overnight. She will see Dr. Roxan Hockey tomorrow as well.      Carola Rhine, PAC

## 2022-09-15 NOTE — Progress Notes (Signed)
[place] Counselor/Therapist Progress Note  Patient ID: Sandra Craig, MRN: [id number],    Date: 09/15/2022  Time Spent: 2:00pm-2:50pm   50 minutes   Treatment Type: Individual Therapy  Reported Symptoms: stress  Mental Status Exam: Appearance:  Casual     Behavior: Appropriate  Motor: Normal  Speech/Language:  Normal Rate  Affect: Appropriate  Mood: normal  Thought process: normal  Thought content:   WNL  Sensory/Perceptual disturbances:   WNL  Orientation: oriented to person, place, time/date, and situation  Attention: Good  Concentration: Good  Memory: WNL  Fund of knowledge:  Good  Insight:   Good  Judgment:  Good  Impulse Control: Good   Risk Assessment: Danger to Self:  No Self-injurious Behavior: No Danger to Others: No Duty to Warn:no Physical Aggression / Violence:No  Access to Firearms a concern: No  Gang Involvement:No   Subjective:  [name] present for individual therapy via phone.  Pt consents to telehealth virtual session due to [place] 19 pandemic. Location of pt: home Location of therapist: home office.  Pt talked about her health.   She had her first radiation treatment yesterday.  She had a lot of anxiety about treatment.  Addressed pt's anxieties.  Pt states she feels scared at times.   Addressed pt's fears and worries.   Worked on calming strategies.   It is hard for pt to ask for help from others.  Addressed how pt can communicate her needs.   She copes with the fear by relying on her faith.  She feels helpless at times.  Pt is trying to take one day at a time.  Pt is also staying connected with her friends who are very supportive.  Pt's husband Sandra Craig has been doing all he can to take care of her.  Worked on coping strategies.  Worked on self care strategies.   Provided supportive therapy.    Interventions: Cognitive Behavioral Therapy and Insight-Oriented  Diagnosis: F33.1  Plan: Plan to meet in two weeks.  Pt is  progressing toward treatment goals.   Plan of Care: Recommend ongoing therapy.   Pt participated in setting treatment goals.  Pt wants to have someone to talk to and to improve coping skills.  Pt wants to feel less anxious and depressed.  Plan to meet every two weeks.    Treatment Plan (Treatment Plan Target Date: 10/26/2022) Client Abilities/Strengths  Pt is bright, engaging, and motivated for therapy.   Client Treatment Preferences  Individual therapy.  Client Statement of Needs  Improve coping skills.  Symptoms  Depressed or irritable mood. Diminished interest in or enjoyment of activities. Lack of energy. Feelings of hopelessness, worthlessness, or inappropriate guilt. Unresolved grief issues.   Problems Addressed  Unipolar Depression Goals 1. Alleviate depressive symptoms and return to previous level of effective functioning. 2. Appropriately grieve the loss in order to normalize mood and to return to previously adaptive level of functioning. Objective Learn and implement behavioral strategies to overcome depression. Target Date: 2022-10-26 Frequency: Biweekly  Progress: 10 Modality: individual  Related Interventions Engage the client in "behavioral activation," increasing his/her activity level and contact with sources of reward, while identifying processes that inhibit activation.  Use behavioral techniques such as instruction, rehearsal, role-playing, role reversal, as needed, to facilitate activity in the client's daily life; reinforce success. Assist the client in developing skills that increase the likelihood of deriving pleasure from behavioral activation (e.g., assertiveness skills, developing an exercise plan, less internal/more external focus, increased social  involvement); reinforce success. Objective Identify important people in life, past and present, and describe the quality, good and poor, of those relationships. Target Date: 2022-10-26 Frequency: Biweekly   Progress: 10 Modality: individual  Related Interventions Conduct Interpersonal Therapy beginning with the assessment of the client's "interpersonal inventory" of important past and present relationships; develop a case formulation linking depression to grief, interpersonal role disputes, role transitions, and/or interpersonal deficits). Objective Learn and implement problem-solving and decision-making skills. Target Date: 2022-10-26 Frequency: Biweekly  Progress: 10 Modality: individual  Related Interventions Conduct Problem-Solving Therapy using techniques such as psychoeducation, modeling, and role-playing to teach client problem-solving skills (i.e., defining a problem specifically, generating possible solutions, evaluating the pros and cons of each solution, selecting and implementing a plan of action, evaluating the efficacy of the plan, accepting or revising the plan); role-play application of the problem-solving skill to a real life issue. Encourage in the client the development of a positive problem orientation in which problems and solving them are viewed as a natural part of life and not something to be feared, despaired, or avoided. 3. Develop healthy interpersonal relationships that lead to the alleviation and help prevent the relapse of depression. 4. Develop healthy thinking patterns and beliefs about self, others, and the world that lead to the alleviation and help prevent the relapse of depression. 5. Recognize, accept, and cope with feelings of depression. Diagnosis F33.1  Conditions For Discharge Achievement of treatment goals and objectives   [name], LCSW

## 2022-09-16 ENCOUNTER — Ambulatory Visit
Admission: RE | Admit: 2022-09-16 | Discharge: 2022-09-16 | Disposition: A | Discharge status: Home / Self Care | Payer: Medicare Other | Source: Ambulatory Visit | Attending: Radiation Oncology | Admitting: Radiation Oncology

## 2022-09-16 ENCOUNTER — Other Ambulatory Visit: Payer: Self-pay

## 2022-09-16 DIAGNOSIS — Z510 Encounter for antineoplastic radiation therapy: Secondary | ICD-10-CM | POA: Diagnosis not present

## 2022-09-16 DIAGNOSIS — C37 Malignant neoplasm of thymus: Secondary | ICD-10-CM

## 2022-09-16 LAB — RAD ONC ARIA SESSION SUMMARY
Course Elapsed Days: 2
Plan Fractions Treated to Date: 3
Plan Prescribed Dose Per Fraction: 1.8 Gy
Plan Total Fractions Prescribed: 30
Plan Total Prescribed Dose: 54 Gy
Reference Point Dosage Given to Date: 5.4 Gy
Reference Point Session Dosage Given: 1.8 Gy
Session Number: 3

## 2022-09-16 MED ORDER — SONAFINE EX EMUL
1.0000 | Freq: Two times a day (BID) | CUTANEOUS | Status: DC
  Administered 2022-09-16: 1 via TOPICAL

## 2022-09-16 NOTE — Telephone Encounter (Addendum)
Deductible n/a  OOP MAX  : $3500 ($790.04)  Annual exam 02/09/2022  Calcium  9.6           Date 08/15/2022  Upcoming dental procedures   Is Prior Authorization needed? required Josem Kaufmann /appeal approved 09/16/2022-09/22/2023 scanned in system  Pt estimated Cost: $299       Coverage Details: For the primary Specialty Pharmacy option, [facility] will be subject to a 20% coinsurance up to a $3,500.00 out of pocket max ($790.04 met). Administration will be covered at 100%

## 2022-09-16 NOTE — Progress Notes (Signed)
Pt here for patient teaching. Pt given Radiation and You booklet, Managing Acute Radiation Side Effects for Head and Neck Cancer handout, skin care instructions, and Sonafine. Reviewed areas of pertinence such as fatigue, hair loss, mouth changes, nausea and vomiting, skin changes, throat changes, cough, shortness of breath, and taste changes. Pt able to give teach back of to pat skin, use unscented/gentle soap, and drink plenty of water, apply Sonafine bid, avoid applying anything to skin within 4 hours of treatment, avoid wearing an under wire bra, and to use an electric razor if they must shave. Pt verbalizes understanding of information given and will contact nursing with any questions or concerns.     [email or web address]

## 2022-09-19 ENCOUNTER — Other Ambulatory Visit: Payer: Self-pay

## 2022-09-19 ENCOUNTER — Ambulatory Visit
Admission: RE | Admit: 2022-09-19 | Discharge: 2022-09-19 | Disposition: A | Discharge status: Home / Self Care | Payer: Medicare Other | Source: Ambulatory Visit | Attending: Radiation Oncology | Admitting: Radiation Oncology

## 2022-09-19 DIAGNOSIS — Z510 Encounter for antineoplastic radiation therapy: Secondary | ICD-10-CM | POA: Diagnosis not present

## 2022-09-19 DIAGNOSIS — C37 Malignant neoplasm of thymus: Secondary | ICD-10-CM | POA: Diagnosis not present

## 2022-09-19 LAB — RAD ONC ARIA SESSION SUMMARY
Course Elapsed Days: 5
Plan Fractions Treated to Date: 4
Plan Prescribed Dose Per Fraction: 1.8 Gy
Plan Total Fractions Prescribed: 30
Plan Total Prescribed Dose: 54 Gy
Reference Point Dosage Given to Date: 7.2 Gy
Reference Point Session Dosage Given: 1.8 Gy
Session Number: 4

## 2022-09-20 ENCOUNTER — Encounter: Payer: Self-pay | Admitting: Thoracic Surgery (Cardiothoracic Vascular Surgery)

## 2022-09-20 ENCOUNTER — Other Ambulatory Visit: Payer: Self-pay

## 2022-09-20 ENCOUNTER — Ambulatory Visit
Admission: RE | Admit: 2022-09-20 | Discharge: 2022-09-20 | Disposition: A | Discharge status: Home / Self Care | Payer: Medicare Other | Source: Ambulatory Visit | Attending: Radiation Oncology | Admitting: Radiation Oncology

## 2022-09-20 ENCOUNTER — Ambulatory Visit (INDEPENDENT_AMBULATORY_CARE_PROVIDER_SITE_OTHER): Payer: Self-pay | Admitting: Thoracic Surgery (Cardiothoracic Vascular Surgery)

## 2022-09-20 VITALS — BP 137/98 | HR 102 | Resp 18 | Ht 64.0 in | Wt 145.0 lb

## 2022-09-20 DIAGNOSIS — Z9889 Other specified postprocedural states: Secondary | ICD-10-CM

## 2022-09-20 DIAGNOSIS — Z510 Encounter for antineoplastic radiation therapy: Secondary | ICD-10-CM | POA: Diagnosis not present

## 2022-09-20 DIAGNOSIS — C37 Malignant neoplasm of thymus: Secondary | ICD-10-CM | POA: Diagnosis not present

## 2022-09-20 LAB — RAD ONC ARIA SESSION SUMMARY
Course Elapsed Days: 6
Plan Fractions Treated to Date: 5
Plan Prescribed Dose Per Fraction: 1.8 Gy
Plan Total Fractions Prescribed: 30
Plan Total Prescribed Dose: 54 Gy
Reference Point Dosage Given to Date: 9 Gy
Reference Point Session Dosage Given: 1.8 Gy
Session Number: 5

## 2022-09-20 NOTE — Progress Notes (Signed)
[address]       [place] [address]             [phone]     HPI: Sandra Craig returns for follow-up after recent thymectomy.  Sandra Craig is an 82 year old woman who had a robotic assisted thymectomy on 08/05/2022.  Initially she did well postoperatively at home on day 2.  When I saw her back about 10 days later she had some hoarseness.  She had a large tumor with some questionable margins.  She saw Dr. Julien Nordmann and Dr. Lisbeth Renshaw.  She did not require adjuvant chemotherapy but is undergoing radiation.  She has had 5 treatments so far.  Feeling well overall but still hoarse.  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   Chronic insomnia    Chronic kidney disease    Cystitis    Depression    Diverticulitis of intestine without perforation or abscess without bleeding    Patient did have abscess but noperforation   Diverticulosis of colon (without mention of hemorrhage)    Endometriosis    Family history of malignant neoplasm of gastrointestinal tract    Fibromyalgia    Gastritis    Hiatal hernia    HIATAL HERNIA 10/08/2008   Qualifier: Diagnosis of  By: Nils Pyle CMA Deborra Medina), Mearl Latin     History of gallstones    HSV-1 infection    Hyperlipidemia    Hypertension    IBS (irritable bowel syndrome)    IC (interstitial cystitis)    Internal hemorrhoid    Mycobacterium avium complex ([place])    history- took antibiotics and completed course   Osteonecrosis ([place])    Osteopenia    Osteoporosis    Palpitations    Polycystic kidney disease    pt denies   Pulmonary nodule 07/2007   5 mm Anterior RUL   Small bowel obstruction ([facility])    Stroke ([place]) 2021   TIA    Current Outpatient Medications  Medication Sig Dispense Refill   aspirin 81 MG tablet Take 1 tablet (81 mg total) by mouth daily. 30 tablet    atorvastatin (LIPITOR) 40 MG tablet Take 1 tablet (40 mg total) by mouth daily.  90 tablet 3   Calcium Citrate-Vitamin D (CALCIUM + D PO) Take 1 tablet by mouth 2 (two) times daily.     DULoxetine (CYMBALTA) 30 MG capsule TAKE ONE CAPSULE BY MOUTH TWICE DAILY 180 capsule 3   famotidine (PEPCID) 20 MG tablet TAKE 1 TABLET(20 MG) BY MOUTH TWICE DAILY 180 tablet 1   gabapentin (NEURONTIN) 300 MG capsule TAKE 1 CAPSULE(300 MG) BY MOUTH TWICE DAILY AS NEEDED FOR PAIN (Patient taking differently: Take 300 mg by mouth 2 (two) times daily.) 180 capsule 3   losartan (COZAAR) 25 MG tablet Take 12.5 mg by mouth daily.     Multiple Vitamins-Minerals (MULTIVITAMIN WITH MINERALS) tablet Take 1 tablet by mouth daily.     Polyethyl Glycol-Propyl Glycol (SYSTANE) 0.4-0.3 % GEL ophthalmic gel Place 1 application into both eyes 3 (three) times daily as needed (dry eye).     zolpidem (AMBIEN) 5 MG tablet TAKE 1 TABLET BY MOUTH EVERY DAY AT BEDTIME FOR SLEEP (Patient taking differently: Take 5 mg by mouth at bedtime.) 90 tablet 1   No current facility-administered medications for this visit.    Physical Exam BP (!) 137/98 (BP Location: Left Arm, Patient  Position: Sitting)   Pulse (!) 102   Resp 18   Ht 5\' 4"  (1.626 m)   Wt 145 lb (65.8 kg)   LMP 08/25/1992   SpO2 95% Comment: RA  BMI 24.5 kg/m  82 year old woman in no acute distress Hoarse voice Lungs clear with equal breath sounds bilaterally Cardiac regular rate and rhythm  Diagnostic Tests: none  Impression: Sandra Craig is an 82 year old woman with a history of hypertension, hyperlipidemia, MAC, fibromyalgia, endometriosis, inguinal hernia, arthritis, interstitial cystitis, osteopenia, osteoporosis, small bowel obstruction, TIA, and a thymoma.  She had a robotic assisted thymectomy on 08/05/2021.  Her only issue postoperatively has been hoarseness.  That has not improved.  She has an appointment scheduled with ENT to further evaluate.  I told her that there is still a good probability that this will recover in time, although  there are no guarantees.  She is currently undergoing adjuvant radiation therapy.  Plan: Follow-up with ENT regarding hoarseness Return in 3 to 4 months after next CT scan  [name], MD [facility] [phone]

## 2022-09-21 ENCOUNTER — Other Ambulatory Visit: Payer: Self-pay

## 2022-09-21 ENCOUNTER — Ambulatory Visit
Admission: RE | Admit: 2022-09-21 | Discharge: 2022-09-21 | Disposition: A | Discharge status: Home / Self Care | Payer: Medicare Other | Source: Ambulatory Visit | Attending: Radiation Oncology | Admitting: Radiation Oncology

## 2022-09-21 DIAGNOSIS — C37 Malignant neoplasm of thymus: Secondary | ICD-10-CM | POA: Diagnosis not present

## 2022-09-21 DIAGNOSIS — Z510 Encounter for antineoplastic radiation therapy: Secondary | ICD-10-CM | POA: Diagnosis not present

## 2022-09-21 LAB — RAD ONC ARIA SESSION SUMMARY
Course Elapsed Days: 7
Plan Fractions Treated to Date: 6
Plan Prescribed Dose Per Fraction: 1.8 Gy
Plan Total Fractions Prescribed: 30
Plan Total Prescribed Dose: 54 Gy
Reference Point Dosage Given to Date: 10.8 Gy
Reference Point Session Dosage Given: 1.8 Gy
Session Number: 6

## 2022-09-22 ENCOUNTER — Other Ambulatory Visit: Payer: Self-pay

## 2022-09-22 ENCOUNTER — Ambulatory Visit
Admission: RE | Admit: 2022-09-22 | Discharge: 2022-09-22 | Disposition: A | Discharge status: Home / Self Care | Payer: Medicare Other | Source: Ambulatory Visit | Attending: Radiation Oncology | Admitting: Radiation Oncology

## 2022-09-22 DIAGNOSIS — Z510 Encounter for antineoplastic radiation therapy: Secondary | ICD-10-CM | POA: Diagnosis not present

## 2022-09-22 DIAGNOSIS — C37 Malignant neoplasm of thymus: Secondary | ICD-10-CM | POA: Diagnosis not present

## 2022-09-22 LAB — RAD ONC ARIA SESSION SUMMARY
Course Elapsed Days: 8
Plan Fractions Treated to Date: 7
Plan Prescribed Dose Per Fraction: 1.8 Gy
Plan Total Fractions Prescribed: 30
Plan Total Prescribed Dose: 54 Gy
Reference Point Dosage Given to Date: 12.6 Gy
Reference Point Session Dosage Given: 1.8 Gy
Session Number: 7

## 2022-09-23 ENCOUNTER — Other Ambulatory Visit: Payer: Self-pay

## 2022-09-23 ENCOUNTER — Ambulatory Visit: Payer: Medicare Other

## 2022-09-23 ENCOUNTER — Ambulatory Visit
Admission: RE | Admit: 2022-09-23 | Discharge: 2022-09-23 | Disposition: A | Discharge status: Home / Self Care | Payer: Medicare Other | Source: Ambulatory Visit | Attending: Radiation Oncology | Admitting: Radiation Oncology

## 2022-09-23 DIAGNOSIS — C37 Malignant neoplasm of thymus: Secondary | ICD-10-CM | POA: Diagnosis not present

## 2022-09-23 DIAGNOSIS — Z510 Encounter for antineoplastic radiation therapy: Secondary | ICD-10-CM | POA: Diagnosis not present

## 2022-09-23 LAB — RAD ONC ARIA SESSION SUMMARY
Course Elapsed Days: 9
Plan Fractions Treated to Date: 8
Plan Prescribed Dose Per Fraction: 1.8 Gy
Plan Total Fractions Prescribed: 30
Plan Total Prescribed Dose: 54 Gy
Reference Point Dosage Given to Date: 14.4 Gy
Reference Point Session Dosage Given: 1.8 Gy
Session Number: 8

## 2022-09-23 NOTE — Telephone Encounter (Signed)
Appeal takes 7 days  Reference # [id number] option 6

## 2022-09-26 ENCOUNTER — Other Ambulatory Visit: Payer: Self-pay

## 2022-09-26 ENCOUNTER — Ambulatory Visit
Admission: RE | Admit: 2022-09-26 | Discharge: 2022-09-26 | Disposition: A | Discharge status: Home / Self Care | Payer: Medicare Other | Source: Ambulatory Visit | Attending: Radiation Oncology | Admitting: Radiation Oncology

## 2022-09-26 DIAGNOSIS — C37 Malignant neoplasm of thymus: Secondary | ICD-10-CM | POA: Diagnosis not present

## 2022-09-26 DIAGNOSIS — Z510 Encounter for antineoplastic radiation therapy: Secondary | ICD-10-CM | POA: Diagnosis not present

## 2022-09-26 LAB — RAD ONC ARIA SESSION SUMMARY
Course Elapsed Days: 12
Plan Fractions Treated to Date: 9
Plan Prescribed Dose Per Fraction: 1.8 Gy
Plan Total Fractions Prescribed: 30
Plan Total Prescribed Dose: 54 Gy
Reference Point Dosage Given to Date: 16.2 Gy
Reference Point Session Dosage Given: 1.8 Gy
Session Number: 9

## 2022-09-27 ENCOUNTER — Ambulatory Visit
Admission: RE | Admit: 2022-09-27 | Discharge: 2022-09-27 | Disposition: A | Discharge status: Home / Self Care | Payer: Medicare Other | Source: Ambulatory Visit | Attending: Radiation Oncology | Admitting: Radiation Oncology

## 2022-09-27 ENCOUNTER — Other Ambulatory Visit: Payer: Self-pay

## 2022-09-27 DIAGNOSIS — Z510 Encounter for antineoplastic radiation therapy: Secondary | ICD-10-CM | POA: Diagnosis not present

## 2022-09-27 DIAGNOSIS — C37 Malignant neoplasm of thymus: Secondary | ICD-10-CM | POA: Diagnosis not present

## 2022-09-27 LAB — RAD ONC ARIA SESSION SUMMARY
Course Elapsed Days: 13
Plan Fractions Treated to Date: 10
Plan Prescribed Dose Per Fraction: 1.8 Gy
Plan Total Fractions Prescribed: 30
Plan Total Prescribed Dose: 54 Gy
Reference Point Dosage Given to Date: 18 Gy
Reference Point Session Dosage Given: 1.8 Gy
Session Number: 10

## 2022-09-28 ENCOUNTER — Other Ambulatory Visit: Payer: Self-pay

## 2022-09-28 ENCOUNTER — Ambulatory Visit
Admission: RE | Admit: 2022-09-28 | Discharge: 2022-09-28 | Disposition: A | Discharge status: Home / Self Care | Payer: Medicare Other | Source: Ambulatory Visit | Attending: Radiation Oncology | Admitting: Radiation Oncology

## 2022-09-28 DIAGNOSIS — Z510 Encounter for antineoplastic radiation therapy: Secondary | ICD-10-CM | POA: Diagnosis not present

## 2022-09-28 DIAGNOSIS — C37 Malignant neoplasm of thymus: Secondary | ICD-10-CM | POA: Diagnosis not present

## 2022-09-28 LAB — RAD ONC ARIA SESSION SUMMARY
Course Elapsed Days: 14
Plan Fractions Treated to Date: 11
Plan Prescribed Dose Per Fraction: 1.8 Gy
Plan Total Fractions Prescribed: 30
Plan Total Prescribed Dose: 54 Gy
Reference Point Dosage Given to Date: 19.8 Gy
Reference Point Session Dosage Given: 1.8 Gy
Session Number: 11

## 2022-09-29 ENCOUNTER — Telehealth: Payer: Self-pay

## 2022-09-29 ENCOUNTER — Ambulatory Visit
Admission: RE | Admit: 2022-09-29 | Discharge: 2022-09-29 | Disposition: A | Discharge status: Home / Self Care | Payer: Medicare Other | Source: Ambulatory Visit | Attending: Radiation Oncology | Admitting: Radiation Oncology

## 2022-09-29 ENCOUNTER — Other Ambulatory Visit: Payer: Self-pay

## 2022-09-29 ENCOUNTER — Other Ambulatory Visit: Payer: Self-pay | Admitting: Radiation Oncology

## 2022-09-29 ENCOUNTER — Ambulatory Visit (INDEPENDENT_AMBULATORY_CARE_PROVIDER_SITE_OTHER): Payer: Medicare Other | Admitting: Psychology

## 2022-09-29 DIAGNOSIS — Z510 Encounter for antineoplastic radiation therapy: Secondary | ICD-10-CM | POA: Diagnosis not present

## 2022-09-29 DIAGNOSIS — F331 Major depressive disorder, recurrent, moderate: Secondary | ICD-10-CM | POA: Diagnosis not present

## 2022-09-29 DIAGNOSIS — C37 Malignant neoplasm of thymus: Secondary | ICD-10-CM | POA: Diagnosis not present

## 2022-09-29 LAB — RAD ONC ARIA SESSION SUMMARY
Course Elapsed Days: 15
Plan Fractions Treated to Date: 12
Plan Prescribed Dose Per Fraction: 1.8 Gy
Plan Total Fractions Prescribed: 30
Plan Total Prescribed Dose: 54 Gy
Reference Point Dosage Given to Date: 21.6 Gy
Reference Point Session Dosage Given: 1.8 Gy
Session Number: 12

## 2022-09-29 MED ORDER — SUCRALFATE 1 G PO TABS: 1.0000 g | ORAL_TABLET | Freq: Four times a day (QID) | ORAL | 2 refills | Status: AC

## 2022-09-29 NOTE — Progress Notes (Signed)
[facility] Counselor/Therapist Progress Note  Patient ID: Sandra Craig, MRN: [id number],    Date: 09/29/2022  Time Spent: 10:00am-10:50am  50 minutes   Treatment Type: Individual Therapy  Reported Symptoms: stress  Mental Status Exam: Appearance:  Casual     Behavior: Appropriate  Motor: Normal  Speech/Language:  Normal Rate  Affect: Appropriate  Mood: normal  Thought process: normal  Thought content:   WNL  Sensory/Perceptual disturbances:   WNL  Orientation: oriented to person, place, time/date, and situation  Attention: Good  Concentration: Good  Memory: WNL  Fund of knowledge:  Good  Insight:   Good  Judgment:  Good  Impulse Control: Good   Risk Assessment: Danger to Self:  No Self-injurious Behavior: No Danger to Others: No Duty to Warn:no Physical Aggression / Violence:No  Access to Firearms a concern: No  Gang Involvement:No   Subjective:  [name] present for individual therapy via phone.  Pt consents to telehealth virtual session due to [place] 19 pandemic. Location of pt: home Location of therapist: home office.  Pt talked about her health.   She is having daily radiation treatments.  She had a lot of anxiety about treatment.  Addressed pt's anxieties.  Overall the treatments have been going well and pt has had minimal side effects.   Pt and Sandra Craig are trying to make a decision about whether to move to [employer] or not.  She feels like it is a big financial decision but also feels the environment may be good for her since she does not feel at home in [name] house.  Helped pt process the issues and worked on Control and instrumentation engineer.   Pt is staying connected with her friends who are very supportive.  Pt's husband Sandra Craig has been doing all he can to take care of her.  Worked on coping strategies.  Worked on self care strategies.   Provided supportive therapy.    Interventions: Cognitive Behavioral Therapy and Insight-Oriented  Diagnosis:  F33.1  Plan: Plan to meet in two weeks.  Pt is progressing toward treatment goals.   Plan of Care: Recommend ongoing therapy.   Pt participated in setting treatment goals.  Pt wants to have someone to talk to and to improve coping skills.  Pt wants to feel less anxious and depressed.  Plan to meet every two weeks.    Treatment Plan (Treatment Plan Target Date: 10/26/2022) Client Abilities/Strengths  Pt is bright, engaging, and motivated for therapy.   Client Treatment Preferences  Individual therapy.  Client Statement of Needs  Improve coping skills.  Symptoms  Depressed or irritable mood. Diminished interest in or enjoyment of activities. Lack of energy. Feelings of hopelessness, worthlessness, or inappropriate guilt. Unresolved grief issues.   Problems Addressed  Unipolar Depression Goals 1. Alleviate depressive symptoms and return to previous level of effective functioning. 2. Appropriately grieve the loss in order to normalize mood and to return to previously adaptive level of functioning. Objective Learn and implement behavioral strategies to overcome depression. Target Date: 2022-10-26 Frequency: Biweekly  Progress: 10 Modality: individual  Related Interventions Engage the client in "behavioral activation," increasing his/her activity level and contact with sources of reward, while identifying processes that inhibit activation.  Use behavioral techniques such as instruction, rehearsal, role-playing, role reversal, as needed, to facilitate activity in the client's daily life; reinforce success. Assist the client in developing skills that increase the likelihood of deriving pleasure from behavioral activation (e.g., assertiveness skills, developing an exercise plan, less internal/more external  focus, increased social involvement); reinforce success. Objective Identify important people in life, past and present, and describe the quality, good and poor, of those  relationships. Target Date: 2022-10-26 Frequency: Biweekly  Progress: 10 Modality: individual  Related Interventions Conduct Interpersonal Therapy beginning with the assessment of the client's "interpersonal inventory" of important past and present relationships; develop a case formulation linking depression to grief, interpersonal role disputes, role transitions, and/or interpersonal deficits). Objective Learn and implement problem-solving and decision-making skills. Target Date: 2022-10-26 Frequency: Biweekly  Progress: 10 Modality: individual  Related Interventions Conduct Problem-Solving Therapy using techniques such as psychoeducation, modeling, and role-playing to teach client problem-solving skills (i.e., defining a problem specifically, generating possible solutions, evaluating the pros and cons of each solution, selecting and implementing a plan of action, evaluating the efficacy of the plan, accepting or revising the plan); role-play application of the problem-solving skill to a real life issue. Encourage in the client the development of a positive problem orientation in which problems and solving them are viewed as a natural part of life and not something to be feared, despaired, or avoided. 3. Develop healthy interpersonal relationships that lead to the alleviation and help prevent the relapse of depression. 4. Develop healthy thinking patterns and beliefs about self, others, and the world that lead to the alleviation and help prevent the relapse of depression. 5. Recognize, accept, and cope with feelings of depression. Diagnosis F33.1  Conditions For Discharge Achievement of treatment goals and objectives   Sandra Bolder, LCSW

## 2022-09-29 NOTE — Telephone Encounter (Signed)
-----   Message from [name], MD sent at 09/29/2022  2:33 PM EST ----- Regarding: RE: ENT referral Yes,please make a referral ----- Message ----- From: Donnella Sham, RN Sent: 09/29/2022  11:42 AM EST To: [name], MD Subject: ENT referral                                   Hey,  She is calling requesting a referral for hoarseness to ENT. I do not believe she has been referred yet. Are you ok with getting this started?  Thanks,  Caryl Pina

## 2022-09-30 ENCOUNTER — Ambulatory Visit
Admission: RE | Admit: 2022-09-30 | Discharge: 2022-09-30 | Disposition: A | Discharge status: Home / Self Care | Payer: Medicare Other | Source: Ambulatory Visit | Attending: Radiation Oncology | Admitting: Radiation Oncology

## 2022-09-30 ENCOUNTER — Other Ambulatory Visit: Payer: Self-pay

## 2022-09-30 ENCOUNTER — Other Ambulatory Visit: Payer: Self-pay | Admitting: Family Medicine

## 2022-09-30 DIAGNOSIS — C37 Malignant neoplasm of thymus: Secondary | ICD-10-CM | POA: Insufficient documentation

## 2022-09-30 DIAGNOSIS — Z510 Encounter for antineoplastic radiation therapy: Secondary | ICD-10-CM | POA: Diagnosis not present

## 2022-09-30 LAB — RAD ONC ARIA SESSION SUMMARY
Course Elapsed Days: 16
Plan Fractions Treated to Date: 13
Plan Prescribed Dose Per Fraction: 1.8 Gy
Plan Total Fractions Prescribed: 30
Plan Total Prescribed Dose: 54 Gy
Reference Point Dosage Given to Date: 23.4 Gy
Reference Point Session Dosage Given: 1.8 Gy
Session Number: 13

## 2022-10-03 ENCOUNTER — Ambulatory Visit
Admission: RE | Admit: 2022-10-03 | Discharge: 2022-10-03 | Disposition: A | Discharge status: Home / Self Care | Payer: Medicare Other | Source: Ambulatory Visit | Attending: Radiation Oncology | Admitting: Radiation Oncology

## 2022-10-03 ENCOUNTER — Other Ambulatory Visit: Payer: Self-pay

## 2022-10-03 DIAGNOSIS — Z510 Encounter for antineoplastic radiation therapy: Secondary | ICD-10-CM | POA: Diagnosis not present

## 2022-10-03 DIAGNOSIS — C37 Malignant neoplasm of thymus: Secondary | ICD-10-CM | POA: Diagnosis not present

## 2022-10-03 LAB — RAD ONC ARIA SESSION SUMMARY
Course Elapsed Days: 19
Plan Fractions Treated to Date: 14
Plan Prescribed Dose Per Fraction: 1.8 Gy
Plan Total Fractions Prescribed: 30
Plan Total Prescribed Dose: 54 Gy
Reference Point Dosage Given to Date: 25.2 Gy
Reference Point Session Dosage Given: 1.8 Gy
Session Number: 14

## 2022-10-03 NOTE — Telephone Encounter (Signed)
Left message for pt to return our call to be scheduled for Prolia

## 2022-10-03 NOTE — Telephone Encounter (Signed)
Pt would like to wait to have Prolia. Pt had sinus surgery in Jan 2024. 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

## 2022-10-04 ENCOUNTER — Ambulatory Visit
Admission: RE | Admit: 2022-10-04 | Discharge: 2022-10-04 | Disposition: A | Discharge status: Home / Self Care | Payer: Medicare Other | Source: Ambulatory Visit | Attending: Radiation Oncology | Admitting: Radiation Oncology

## 2022-10-04 ENCOUNTER — Other Ambulatory Visit: Payer: Self-pay

## 2022-10-04 DIAGNOSIS — C37 Malignant neoplasm of thymus: Secondary | ICD-10-CM | POA: Diagnosis not present

## 2022-10-04 DIAGNOSIS — Z510 Encounter for antineoplastic radiation therapy: Secondary | ICD-10-CM | POA: Diagnosis not present

## 2022-10-04 LAB — RAD ONC ARIA SESSION SUMMARY
Course Elapsed Days: 20
Plan Fractions Treated to Date: 15
Plan Prescribed Dose Per Fraction: 1.8 Gy
Plan Total Fractions Prescribed: 30
Plan Total Prescribed Dose: 54 Gy
Reference Point Dosage Given to Date: 27 Gy
Reference Point Session Dosage Given: 1.8 Gy
Session Number: 15

## 2022-10-05 ENCOUNTER — Other Ambulatory Visit: Payer: Self-pay

## 2022-10-05 ENCOUNTER — Ambulatory Visit
Admission: RE | Admit: 2022-10-05 | Discharge: 2022-10-05 | Disposition: A | Discharge status: Home / Self Care | Payer: Medicare Other | Source: Ambulatory Visit | Attending: Radiation Oncology | Admitting: Radiation Oncology

## 2022-10-05 DIAGNOSIS — Z510 Encounter for antineoplastic radiation therapy: Secondary | ICD-10-CM | POA: Diagnosis not present

## 2022-10-05 DIAGNOSIS — C37 Malignant neoplasm of thymus: Secondary | ICD-10-CM | POA: Diagnosis not present

## 2022-10-05 LAB — RAD ONC ARIA SESSION SUMMARY
Course Elapsed Days: 21
Plan Fractions Treated to Date: 16
Plan Prescribed Dose Per Fraction: 1.8 Gy
Plan Total Fractions Prescribed: 30
Plan Total Prescribed Dose: 54 Gy
Reference Point Dosage Given to Date: 28.8 Gy
Reference Point Session Dosage Given: 1.8 Gy
Session Number: 16

## 2022-10-06 ENCOUNTER — Other Ambulatory Visit: Payer: Self-pay

## 2022-10-06 ENCOUNTER — Ambulatory Visit
Admission: RE | Admit: 2022-10-06 | Discharge: 2022-10-06 | Disposition: A | Discharge status: Home / Self Care | Payer: Medicare Other | Source: Ambulatory Visit | Attending: Radiation Oncology | Admitting: Radiation Oncology

## 2022-10-06 DIAGNOSIS — C37 Malignant neoplasm of thymus: Secondary | ICD-10-CM | POA: Diagnosis not present

## 2022-10-06 DIAGNOSIS — Z510 Encounter for antineoplastic radiation therapy: Secondary | ICD-10-CM | POA: Diagnosis not present

## 2022-10-06 LAB — RAD ONC ARIA SESSION SUMMARY
Course Elapsed Days: 22
Plan Fractions Treated to Date: 17
Plan Prescribed Dose Per Fraction: 1.8 Gy
Plan Total Fractions Prescribed: 30
Plan Total Prescribed Dose: 54 Gy
Reference Point Dosage Given to Date: 30.6 Gy
Reference Point Session Dosage Given: 1.8 Gy
Session Number: 17

## 2022-10-06 NOTE — Telephone Encounter (Signed)
Pt aware of recommendations and will call us once she finishes treatments.

## 2022-10-06 NOTE — Telephone Encounter (Signed)
Sandra Bruins, MD  Reita Chard minutes ago (3:12 PM)    Can give the Prolia after she finishes her Radiotherapy. Dr Carlean Jews

## 2022-10-07 ENCOUNTER — Ambulatory Visit
Admission: RE | Admit: 2022-10-07 | Discharge: 2022-10-07 | Disposition: A | Discharge status: Home / Self Care | Payer: Medicare Other | Source: Ambulatory Visit | Attending: Radiation Oncology | Admitting: Radiation Oncology

## 2022-10-07 ENCOUNTER — Other Ambulatory Visit: Payer: Self-pay

## 2022-10-07 DIAGNOSIS — Z510 Encounter for antineoplastic radiation therapy: Secondary | ICD-10-CM | POA: Diagnosis not present

## 2022-10-07 DIAGNOSIS — C37 Malignant neoplasm of thymus: Secondary | ICD-10-CM | POA: Diagnosis not present

## 2022-10-07 LAB — RAD ONC ARIA SESSION SUMMARY
Course Elapsed Days: 23
Plan Fractions Treated to Date: 18
Plan Prescribed Dose Per Fraction: 1.8 Gy
Plan Total Fractions Prescribed: 30
Plan Total Prescribed Dose: 54 Gy
Reference Point Dosage Given to Date: 32.4 Gy
Reference Point Session Dosage Given: 1.8 Gy
Session Number: 18

## 2022-10-10 ENCOUNTER — Ambulatory Visit
Admission: RE | Admit: 2022-10-10 | Discharge: 2022-10-10 | Disposition: A | Discharge status: Home / Self Care | Payer: Medicare Other | Source: Ambulatory Visit | Attending: Radiation Oncology | Admitting: Radiation Oncology

## 2022-10-10 ENCOUNTER — Other Ambulatory Visit: Payer: Self-pay

## 2022-10-10 DIAGNOSIS — J3800 Paralysis of vocal cords and larynx, unspecified: Secondary | ICD-10-CM | POA: Diagnosis not present

## 2022-10-10 DIAGNOSIS — Z510 Encounter for antineoplastic radiation therapy: Secondary | ICD-10-CM | POA: Diagnosis not present

## 2022-10-10 DIAGNOSIS — R490 Dysphonia: Secondary | ICD-10-CM | POA: Diagnosis not present

## 2022-10-10 DIAGNOSIS — C37 Malignant neoplasm of thymus: Secondary | ICD-10-CM | POA: Diagnosis not present

## 2022-10-10 LAB — RAD ONC ARIA SESSION SUMMARY
Course Elapsed Days: 26
Plan Fractions Treated to Date: 19
Plan Prescribed Dose Per Fraction: 1.8 Gy
Plan Total Fractions Prescribed: 30
Plan Total Prescribed Dose: 54 Gy
Reference Point Dosage Given to Date: 34.2 Gy
Reference Point Session Dosage Given: 1.8 Gy
Session Number: 19

## 2022-10-11 ENCOUNTER — Ambulatory Visit (INDEPENDENT_AMBULATORY_CARE_PROVIDER_SITE_OTHER): Payer: Medicare Other | Admitting: Psychology

## 2022-10-11 ENCOUNTER — Other Ambulatory Visit: Payer: Self-pay

## 2022-10-11 ENCOUNTER — Ambulatory Visit
Admission: RE | Admit: 2022-10-11 | Discharge: 2022-10-11 | Disposition: A | Discharge status: Home / Self Care | Payer: Medicare Other | Source: Ambulatory Visit | Attending: Radiation Oncology | Admitting: Radiation Oncology

## 2022-10-11 DIAGNOSIS — F331 Major depressive disorder, recurrent, moderate: Secondary | ICD-10-CM

## 2022-10-11 DIAGNOSIS — C37 Malignant neoplasm of thymus: Secondary | ICD-10-CM | POA: Diagnosis not present

## 2022-10-11 DIAGNOSIS — Z510 Encounter for antineoplastic radiation therapy: Secondary | ICD-10-CM | POA: Diagnosis not present

## 2022-10-11 LAB — RAD ONC ARIA SESSION SUMMARY
Course Elapsed Days: 27
Plan Fractions Treated to Date: 20
Plan Prescribed Dose Per Fraction: 1.8 Gy
Plan Total Fractions Prescribed: 30
Plan Total Prescribed Dose: 54 Gy
Reference Point Dosage Given to Date: 36 Gy
Reference Point Session Dosage Given: 1.8 Gy
Session Number: 20

## 2022-10-11 NOTE — Progress Notes (Signed)
[facility] Counselor/Therapist Progress Note  Patient ID: Sandra Craig, MRN: [id number],    Date: 10/11/2022  Time Spent: 11:00am-11:45am  45 minutes   Treatment Type: Individual Therapy  Reported Symptoms: stress  Mental Status Exam: Appearance:  Casual     Behavior: Appropriate  Motor: Normal  Speech/Language:  Normal Rate  Affect: Appropriate  Mood: normal  Thought process: normal  Thought content:   WNL  Sensory/Perceptual disturbances:   WNL  Orientation: oriented to person, place, time/date, and situation  Attention: Good  Concentration: Good  Memory: WNL  Fund of knowledge:  Good  Insight:   Good  Judgment:  Good  Impulse Control: Good   Risk Assessment: Danger to Self:  No Self-injurious Behavior: No Danger to Others: No Duty to Warn:no Physical Aggression / Violence:No  Access to Firearms a concern: No  Gang Involvement:No   Subjective:  [name] present for individual therapy via phone.  Pt consents to telehealth virtual session due to [place] 19 pandemic. Location of pt: home Location of therapist: home office.  Pt talked about her health.   She is having daily radiation treatments.  She had a lot of anxiety about treatment.  The treatments have been more difficult.  Pt is struggling with the decision about whether to move. Helped pt process the issues and worked on Control and instrumentation engineer.   Worked on coping strategies.  Worked on self care strategies.   Provided supportive therapy.    Interventions: Cognitive Behavioral Therapy and Insight-Oriented  Diagnosis: F33.1  Plan: Plan to meet in two weeks.  Pt is progressing toward treatment goals.   Plan of Care: Recommend ongoing therapy.   Pt participated in setting treatment goals.  Pt wants to have someone to talk to and to improve coping skills.  Pt wants to feel less anxious and depressed.  Plan to meet every two weeks.    Treatment Plan (Treatment Plan Target Date:  10/26/2022) Client Abilities/Strengths  Pt is bright, engaging, and motivated for therapy.   Client Treatment Preferences  Individual therapy.  Client Statement of Needs  Improve coping skills.  Symptoms  Depressed or irritable mood. Diminished interest in or enjoyment of activities. Lack of energy. Feelings of hopelessness, worthlessness, or inappropriate guilt. Unresolved grief issues.   Problems Addressed  Unipolar Depression Goals 1. Alleviate depressive symptoms and return to previous level of effective functioning. 2. Appropriately grieve the loss in order to normalize mood and to return to previously adaptive level of functioning. Objective Learn and implement behavioral strategies to overcome depression. Target Date: 2022-10-26 Frequency: Biweekly  Progress: 10 Modality: individual  Related Interventions Engage the client in "behavioral activation," increasing his/her activity level and contact with sources of reward, while identifying processes that inhibit activation.  Use behavioral techniques such as instruction, rehearsal, role-playing, role reversal, as needed, to facilitate activity in the client's daily life; reinforce success. Assist the client in developing skills that increase the likelihood of deriving pleasure from behavioral activation (e.g., assertiveness skills, developing an exercise plan, less internal/more external focus, increased social involvement); reinforce success. Objective Identify important people in life, past and present, and describe the quality, good and poor, of those relationships. Target Date: 2022-10-26 Frequency: Biweekly  Progress: 10 Modality: individual  Related Interventions Conduct Interpersonal Therapy beginning with the assessment of the client's "interpersonal inventory" of important past and present relationships; develop a case formulation linking depression to grief, interpersonal role disputes, role transitions,  and/or  interpersonal deficits). Objective Learn and implement problem-solving and decision-making skills. Target Date: 2022-10-26 Frequency: Biweekly  Progress: 10 Modality: individual  Related Interventions Conduct Problem-Solving Therapy using techniques such as psychoeducation, modeling, and role-playing to teach client problem-solving skills (i.e., defining a problem specifically, generating possible solutions, evaluating the pros and cons of each solution, selecting and implementing a plan of action, evaluating the efficacy of the plan, accepting or revising the plan); role-play application of the problem-solving skill to a real life issue. Encourage in the client the development of a positive problem orientation in which problems and solving them are viewed as a natural part of life and not something to be feared, despaired, or avoided. 3. Develop healthy interpersonal relationships that lead to the alleviation and help prevent the relapse of depression. 4. Develop healthy thinking patterns and beliefs about self, others, and the world that lead to the alleviation and help prevent the relapse of depression. 5. Recognize, accept, and cope with feelings of depression. Diagnosis F33.1  Conditions For Discharge Achievement of treatment goals and objectives   [name], LCSW

## 2022-10-12 ENCOUNTER — Other Ambulatory Visit: Payer: Self-pay

## 2022-10-12 ENCOUNTER — Ambulatory Visit
Admission: RE | Admit: 2022-10-12 | Discharge: 2022-10-12 | Disposition: A | Discharge status: Home / Self Care | Payer: Medicare Other | Source: Ambulatory Visit | Attending: Radiation Oncology | Admitting: Radiation Oncology

## 2022-10-12 DIAGNOSIS — C37 Malignant neoplasm of thymus: Secondary | ICD-10-CM | POA: Diagnosis not present

## 2022-10-12 DIAGNOSIS — Z510 Encounter for antineoplastic radiation therapy: Secondary | ICD-10-CM | POA: Diagnosis not present

## 2022-10-12 LAB — RAD ONC ARIA SESSION SUMMARY
Course Elapsed Days: 28
Plan Fractions Treated to Date: 21
Plan Prescribed Dose Per Fraction: 1.8 Gy
Plan Total Fractions Prescribed: 30
Plan Total Prescribed Dose: 54 Gy
Reference Point Dosage Given to Date: 37.8 Gy
Reference Point Session Dosage Given: 1.8 Gy
Session Number: 21

## 2022-10-13 ENCOUNTER — Ambulatory Visit
Admission: RE | Admit: 2022-10-13 | Discharge: 2022-10-13 | Disposition: A | Discharge status: Home / Self Care | Payer: Medicare Other | Source: Ambulatory Visit | Attending: Radiation Oncology | Admitting: Radiation Oncology

## 2022-10-13 ENCOUNTER — Other Ambulatory Visit: Payer: Self-pay

## 2022-10-13 DIAGNOSIS — Z510 Encounter for antineoplastic radiation therapy: Secondary | ICD-10-CM | POA: Diagnosis not present

## 2022-10-13 DIAGNOSIS — C37 Malignant neoplasm of thymus: Secondary | ICD-10-CM | POA: Diagnosis not present

## 2022-10-13 LAB — RAD ONC ARIA SESSION SUMMARY
Course Elapsed Days: 29
Plan Fractions Treated to Date: 22
Plan Prescribed Dose Per Fraction: 1.8 Gy
Plan Total Fractions Prescribed: 30
Plan Total Prescribed Dose: 54 Gy
Reference Point Dosage Given to Date: 39.6 Gy
Reference Point Session Dosage Given: 1.8 Gy
Session Number: 22

## 2022-10-14 ENCOUNTER — Ambulatory Visit: Payer: Medicare Other

## 2022-10-14 ENCOUNTER — Other Ambulatory Visit: Payer: Self-pay

## 2022-10-14 ENCOUNTER — Ambulatory Visit
Admission: RE | Admit: 2022-10-14 | Discharge: 2022-10-14 | Disposition: A | Discharge status: Home / Self Care | Payer: Medicare Other | Source: Ambulatory Visit | Attending: Radiation Oncology | Admitting: Radiation Oncology

## 2022-10-14 DIAGNOSIS — C37 Malignant neoplasm of thymus: Secondary | ICD-10-CM | POA: Diagnosis not present

## 2022-10-14 DIAGNOSIS — Z510 Encounter for antineoplastic radiation therapy: Secondary | ICD-10-CM | POA: Diagnosis not present

## 2022-10-14 LAB — RAD ONC ARIA SESSION SUMMARY
Course Elapsed Days: 30
Plan Fractions Treated to Date: 23
Plan Prescribed Dose Per Fraction: 1.8 Gy
Plan Total Fractions Prescribed: 30
Plan Total Prescribed Dose: 54 Gy
Reference Point Dosage Given to Date: 41.4 Gy
Reference Point Session Dosage Given: 1.8 Gy
Session Number: 23

## 2022-10-17 ENCOUNTER — Other Ambulatory Visit: Payer: Self-pay

## 2022-10-17 ENCOUNTER — Ambulatory Visit
Admission: RE | Admit: 2022-10-17 | Discharge: 2022-10-17 | Disposition: A | Discharge status: Home / Self Care | Payer: Medicare Other | Source: Ambulatory Visit | Attending: Radiation Oncology | Admitting: Radiation Oncology

## 2022-10-17 DIAGNOSIS — C37 Malignant neoplasm of thymus: Secondary | ICD-10-CM | POA: Diagnosis not present

## 2022-10-17 DIAGNOSIS — Z510 Encounter for antineoplastic radiation therapy: Secondary | ICD-10-CM | POA: Diagnosis not present

## 2022-10-17 LAB — RAD ONC ARIA SESSION SUMMARY
Course Elapsed Days: 33
Plan Fractions Treated to Date: 24
Plan Prescribed Dose Per Fraction: 1.8 Gy
Plan Total Fractions Prescribed: 30
Plan Total Prescribed Dose: 54 Gy
Reference Point Dosage Given to Date: 43.2 Gy
Reference Point Session Dosage Given: 1.8 Gy
Session Number: 24

## 2022-10-18 ENCOUNTER — Ambulatory Visit
Admission: RE | Admit: 2022-10-18 | Discharge: 2022-10-18 | Disposition: A | Discharge status: Home / Self Care | Payer: Medicare Other | Source: Ambulatory Visit | Attending: Radiation Oncology | Admitting: Radiation Oncology

## 2022-10-18 ENCOUNTER — Other Ambulatory Visit: Payer: Self-pay

## 2022-10-18 DIAGNOSIS — Z510 Encounter for antineoplastic radiation therapy: Secondary | ICD-10-CM | POA: Diagnosis not present

## 2022-10-18 DIAGNOSIS — C37 Malignant neoplasm of thymus: Secondary | ICD-10-CM | POA: Diagnosis not present

## 2022-10-18 LAB — RAD ONC ARIA SESSION SUMMARY
Course Elapsed Days: 34
Plan Fractions Treated to Date: 25
Plan Prescribed Dose Per Fraction: 1.8 Gy
Plan Total Fractions Prescribed: 30
Plan Total Prescribed Dose: 54 Gy
Reference Point Dosage Given to Date: 45 Gy
Reference Point Session Dosage Given: 1.8 Gy
Session Number: 25

## 2022-10-19 ENCOUNTER — Ambulatory Visit
Admission: RE | Admit: 2022-10-19 | Discharge: 2022-10-19 | Disposition: A | Discharge status: Home / Self Care | Payer: Medicare Other | Source: Ambulatory Visit | Attending: Radiation Oncology | Admitting: Radiation Oncology

## 2022-10-19 ENCOUNTER — Other Ambulatory Visit: Payer: Self-pay

## 2022-10-19 DIAGNOSIS — Z510 Encounter for antineoplastic radiation therapy: Secondary | ICD-10-CM | POA: Diagnosis not present

## 2022-10-19 DIAGNOSIS — C37 Malignant neoplasm of thymus: Secondary | ICD-10-CM | POA: Diagnosis not present

## 2022-10-19 LAB — RAD ONC ARIA SESSION SUMMARY
Course Elapsed Days: 35
Plan Fractions Treated to Date: 26
Plan Prescribed Dose Per Fraction: 1.8 Gy
Plan Total Fractions Prescribed: 30
Plan Total Prescribed Dose: 54 Gy
Reference Point Dosage Given to Date: 46.8 Gy
Reference Point Session Dosage Given: 1.8 Gy
Session Number: 26

## 2022-10-20 ENCOUNTER — Other Ambulatory Visit: Payer: Self-pay

## 2022-10-20 ENCOUNTER — Ambulatory Visit
Admission: RE | Admit: 2022-10-20 | Discharge: 2022-10-20 | Disposition: A | Discharge status: Home / Self Care | Payer: Medicare Other | Source: Ambulatory Visit | Attending: Radiation Oncology | Admitting: Radiation Oncology

## 2022-10-20 DIAGNOSIS — Z510 Encounter for antineoplastic radiation therapy: Secondary | ICD-10-CM | POA: Diagnosis not present

## 2022-10-20 DIAGNOSIS — C37 Malignant neoplasm of thymus: Secondary | ICD-10-CM | POA: Diagnosis not present

## 2022-10-20 LAB — RAD ONC ARIA SESSION SUMMARY
Course Elapsed Days: 36
Plan Fractions Treated to Date: 27
Plan Prescribed Dose Per Fraction: 1.8 Gy
Plan Total Fractions Prescribed: 30
Plan Total Prescribed Dose: 54 Gy
Reference Point Dosage Given to Date: 48.6 Gy
Reference Point Session Dosage Given: 1.8 Gy
Session Number: 27

## 2022-10-21 ENCOUNTER — Ambulatory Visit
Admission: RE | Admit: 2022-10-21 | Discharge: 2022-10-21 | Disposition: A | Discharge status: Home / Self Care | Payer: Medicare Other | Source: Ambulatory Visit | Attending: Radiation Oncology | Admitting: Radiation Oncology

## 2022-10-21 ENCOUNTER — Other Ambulatory Visit: Payer: Self-pay

## 2022-10-21 ENCOUNTER — Telehealth: Payer: Self-pay | Admitting: Pharmacist

## 2022-10-21 ENCOUNTER — Ambulatory Visit: Payer: Medicare Other

## 2022-10-21 DIAGNOSIS — Z510 Encounter for antineoplastic radiation therapy: Secondary | ICD-10-CM | POA: Diagnosis not present

## 2022-10-21 DIAGNOSIS — C37 Malignant neoplasm of thymus: Secondary | ICD-10-CM | POA: Diagnosis not present

## 2022-10-21 LAB — RAD ONC ARIA SESSION SUMMARY
Course Elapsed Days: 37
Plan Fractions Treated to Date: 28
Plan Prescribed Dose Per Fraction: 1.8 Gy
Plan Total Fractions Prescribed: 30
Plan Total Prescribed Dose: 54 Gy
Reference Point Dosage Given to Date: 50.4 Gy
Reference Point Session Dosage Given: 1.8 Gy
Session Number: 28

## 2022-10-21 MED ORDER — RADIAPLEXRX EX GEL: Freq: Once | CUTANEOUS | Status: DC

## 2022-10-21 MED ORDER — SONAFINE EX EMUL
1.0000 | Freq: Two times a day (BID) | CUTANEOUS | Status: DC
  Administered 2022-10-21: 1 via TOPICAL

## 2022-10-21 NOTE — Progress Notes (Signed)
Care Management & Coordination Services Pharmacy Team  Reason for Encounter: Appointment Reminder  Contacted patient to confirm telephone appointment with Leata Mouse, PharmD on 10/24/2022 at 1 pm. Unsuccessful outreach. Left voicemail with appointment details.   Star Rating Drugs:  Atorvastatin 40 mg last filled 08/06/2022 90 DS Losartan 25 mg last filled 09/27/2022 90 DS   Care Gaps: Annual wellness visit in last year? Yes   Future Appointments  Date Time Provider [place]  10/21/2022  3:30 PM [facility] None  10/21/2022  3:45 PM [facility] None  10/24/2022  1:00 PM Beverly Milch L, RPH [facility] None  10/24/2022  4:15 PM [facility] 4 [facility] None  10/25/2022  4:15 PM [facility] 4 [facility] None  10/27/2022 11:00 AM Bauert, Terri W, LCSW [facility] None  11/10/2022 11:00 AM Bauert, Nicolasa Ducking, LCSW [facility] None  11/24/2022 11:00 AM Bauert, Nicolasa Ducking, LCSW [facility] None  11/30/2022  9:00 AM [name], MD [facility]  12/29/2022 10:30 AM [facility] ANNUAL WELLNESS VISIT 1 [facility]  12/30/2022 11:00 AM [facility] None  01/03/2023 11:00 AM Curt Bears, MD [facility] None   April D Calhoun, [place] Pharmacist Assistant [phone]

## 2022-10-24 ENCOUNTER — Ambulatory Visit: Payer: Medicare Other | Admitting: Pharmacist

## 2022-10-24 ENCOUNTER — Other Ambulatory Visit: Payer: Self-pay

## 2022-10-24 ENCOUNTER — Ambulatory Visit: Payer: Medicare Other

## 2022-10-24 ENCOUNTER — Ambulatory Visit
Admission: RE | Admit: 2022-10-24 | Discharge: 2022-10-24 | Disposition: A | Discharge status: Home / Self Care | Payer: Medicare Other | Source: Ambulatory Visit | Attending: Radiation Oncology | Admitting: Radiation Oncology

## 2022-10-24 DIAGNOSIS — Z510 Encounter for antineoplastic radiation therapy: Secondary | ICD-10-CM | POA: Diagnosis not present

## 2022-10-24 DIAGNOSIS — C37 Malignant neoplasm of thymus: Secondary | ICD-10-CM | POA: Diagnosis not present

## 2022-10-24 LAB — RAD ONC ARIA SESSION SUMMARY
Course Elapsed Days: 40
Plan Fractions Treated to Date: 29
Plan Prescribed Dose Per Fraction: 1.8 Gy
Plan Total Fractions Prescribed: 30
Plan Total Prescribed Dose: 54 Gy
Reference Point Dosage Given to Date: 52.2 Gy
Reference Point Session Dosage Given: 1.8 Gy
Session Number: 29

## 2022-10-24 NOTE — Progress Notes (Signed)
Care Management & Coordination Services Pharmacy Note  10/25/2022 Name:  Sandra Craig MRN:  [id number] DOB:  April 29, 1941  Summary: PharmD FU visit.  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  Doing well overall despite treatment with radiation.  She has no concerns at this time.  Did question if she should continue with Prolia.  I believe this would be beneficial to continue will also defer to PCP for opinion.  Recommendations/Changes made from today's visit: Repeat DEXA August 2024  Follow up plan: FU 1 year   Subjective: Sandra Craig is an 82 y.o. year old female who is a primary patient of Yong Channel, [name], MD.  The care coordination team was consulted for assistance with disease management and care coordination needs.    Engaged with patient by telephone for follow up visit.     Recent office visits:  03/22/2022 OV (PCP) [name], MD; no medication changes noted.   12/07/2021 OV (PCP) [name], MD; Amlodipine 1.25 mg before bed-can take an additional half tablet if needed if blood pressure over 165/100- hasnt needed lately.   11/02/2021 OV (PCP) [name], MD; Augment for UTI   Recent consult visits:  02/09/2022 OV (Gynecology) Princess Bruins, MD; no medication changes noted.   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  D-dimer was positive but no blood clot in chest or MI.     Objective:  Lab Results  Component Value Date   CREATININE 1.63 (H) 08/22/2022   BUN 25 (H) 08/22/2022   GFR 30.78 (L) 08/15/2022   EGFR 34 02/10/2022   GFRNONAA 32 (L) 08/22/2022   GFRAA 35 (L) 07/21/2020   NA 141 08/22/2022   K 4.2 08/22/2022   CALCIUM 9.6 08/22/2022   CO2 33 (H) 08/22/2022   GLUCOSE 84 08/22/2022    Lab Results  Component Value Date/Time   HGBA1C 6.1 06/22/2022 10:36 AM   HGBA1C 5.8 12/07/2021 09:35 AM   GFR  30.78 (L) 08/15/2022 04:51 PM   GFR 28.62 (L) 06/22/2022 10:36 AM    Last diabetic Eye exam: No results found for: "HMDIABEYEEXA"  Last diabetic Foot exam: No results found for: "HMDIABFOOTEX"   Lab Results  Component Value Date   CHOL 144 12/07/2021   HDL 62.10 12/07/2021   LDLCALC 63 12/07/2021   LDLDIRECT 59.0 11/03/2020   TRIG 97.0 12/07/2021   CHOLHDL 2 12/07/2021       Latest Ref Rng & Units 08/22/2022   11:14 AM 08/15/2022    4:51 PM 08/07/2022    4:59 AM  Hepatic Function  Total Protein 6.5 - 8.1 g/dL 6.3  6.7  5.6   Albumin 3.5 - 5.0 g/dL 3.5  3.9  2.7   AST 15 - 41 U/L 34  41  29   ALT 0 - 44 U/L 37  34  26   Alk Phosphatase 38 - 126 U/L 129  125  65   Total Bilirubin 0.3 - 1.2 mg/dL 0.4  0.4  0.5     Lab Results  Component Value Date/Time   TSH 2.60 11/11/2019 02:53 PM   TSH 1.63 03/10/2017 02:18 PM   FREET4 0.64 10/09/2013 04:21 PM   FREET4 0.7 12/11/2006 07:46 AM       Latest Ref Rng & Units 08/22/2022   11:14 AM 08/15/2022    4:51 PM 08/07/2022  4:59 AM  CBC  WBC 4.0 - 10.5 K/uL 6.8  8.0  8.3   Hemoglobin 12.0 - 15.0 g/dL 10.8  11.3  9.8   Hematocrit 36.0 - 46.0 % 34.1  34.4  32.9   Platelets 150 - 400 K/uL 270  338.0  145     Lab Results  Component Value Date/Time   VD25OH 37.49 01/29/2019 04:14 PM   VD25OH 39 12/28/2011 02:19 PM   VD25OH 42 03/10/2009 08:52 PM   VITAMINB12 318 11/11/2019 02:53 PM    Clinical ASCVD: Yes  The ASCVD Risk score (Arnett DK, et al., 2019) failed to calculate for the following reasons:   The 2019 ASCVD risk score is only valid for ages 85 to 14   The patient has a prior MI or stroke diagnosis        06/22/2022    9:59 AM 03/22/2022   11:01 AM 12/21/2021   10:37 AM  Depression screen PHQ 2/9  Decreased Interest 1 0 0  Down, Depressed, Hopeless 2 0 0  PHQ - 2 Score 3 0 0  Altered sleeping 0 0   Tired, decreased energy 0 0   Change in appetite 0 0   Feeling bad or failure about yourself  0 0   Trouble  concentrating 1 1   Moving slowly or fidgety/restless 0 0   Suicidal thoughts 0 0   PHQ-9 Score 4 1   Difficult doing work/chores Not difficult at all Not difficult at all      Social History   Tobacco Use  Smoking Status Former   Packs/day: 0.75   Years: 8.00   Additional pack years: 0.00   Total pack years: 6.00   Types: Cigarettes   Quit date: 08/01/1976   Years since quitting: 46.2  Smokeless Tobacco Never  Tobacco Comments   Quit in 1978   BP Readings from Last 3 Encounters:  09/20/22 (!) 137/98  09/05/22 133/79  08/29/22 125/85   Pulse Readings from Last 3 Encounters:  09/20/22 (!) 102  09/05/22 86  08/29/22 73   Wt Readings from Last 3 Encounters:  09/20/22 145 lb (65.8 kg)  09/09/22 140 lb (63.5 kg)  09/05/22 147 lb 1.6 oz (66.7 kg)   BMI Readings from Last 3 Encounters:  09/20/22 24.89 kg/m  09/09/22 24.03 kg/m  09/05/22 25.25 kg/m    Allergies  Allergen Reactions   Azithromycin Rash    Pruritic rash diffuse     Celecoxib Nausea Only    Medications Reviewed Today     Reviewed by Edythe Clarity, RPH (Pharmacist) on 10/25/22 at 1145  Med List Status: <None>   Medication Order Taking? Sig Documenting Provider Last Dose Status Informant  aspirin 81 MG tablet [id number] No Take 1 tablet (81 mg total) by mouth daily. Frann Rider, NP Taking Active Self  atorvastatin (LIPITOR) 40 MG tablet [id number] No Take 1 tablet (40 mg total) by mouth daily. [name], MD Taking Active Self  Calcium Citrate-Vitamin D (CALCIUM + D PO) [id number] No Take 1 tablet by mouth 2 (two) times daily. [provider] Taking Active Self  DULoxetine (CYMBALTA) 30 MG capsule [id number] No TAKE ONE CAPSULE BY MOUTH TWICE DAILY [name], MD Taking Active   famotidine (PEPCID) 20 MG tablet [id number] No TAKE 1 TABLET(20 MG) BY MOUTH TWICE DAILY [name], MD Taking Active Self  gabapentin (NEURONTIN) 300 MG capsule [id number] No TAKE 1 CAPSULE(300  MG) BY MOUTH TWICE DAILY AS NEEDED  FOR PAIN  Patient taking differently: Take 300 mg by mouth 2 (two) times daily.   [name], MD Taking Active Self  losartan (COZAAR) 25 MG tablet [id number] No Take 12.5 mg by mouth daily. [provider] Taking Active Self  Multiple Vitamins-Minerals (MULTIVITAMIN WITH MINERALS) tablet [id number] No Take 1 tablet by mouth daily. [provider] Taking Active Self  Polyethyl Glycol-Propyl Glycol (SYSTANE) 0.4-0.3 % GEL ophthalmic gel [id number] No Place 1 application into both eyes 3 (three) times daily as needed (dry eye). [provider] Taking Active Self  sucralfate (CARAFATE) 1 g tablet [id number]  Take 1 tablet (1 g total) by mouth 4 (four) times daily. Dissolve each tablet in 15 cc water before use. Kyung Rudd, MD  Active   zolpidem [facility]) 5 MG tablet [id number]  TAKE 1 TABLET BY MOUTH EVERY DAY AT BEDTIME FOR SLEEP [name], MD  Active             SDOH:  (Social Determinants of Health) assessments and interventions performed: No, don within year Financial Resource Strain: Low Risk  (12/21/2021)   Overall Financial Resource Strain (CARDIA)    Difficulty of Paying Living Expenses: Not hard at all   Food Insecurity: No Food Insecurity (08/08/2022)   Hunger Vital Sign    Worried About Running Out of Food in the Last Year: Never true    Ran Out of Food in the Last Year: Never true    SDOH Interventions    Flowsheet Row Telephone from 08/08/2022 in [place] Visit from 06/22/2022 in [place] Visit from 03/22/2022 in [place] Visit from 05/20/2021 in [place] Visit from 02/09/2021 in [place] Visit from 10/04/2019 in [place] Interventions Intervention Not Indicated  -- -- -- -- --  Transportation Interventions Intervention Not Indicated -- -- -- -- --  Depression Interventions/Treatment  -- PHQ2-9 Score <4 Follow-up Not Indicated PHQ2-9 Score <4 Follow-up Not Indicated Medication, Counseling Medication, Counseling Counseling       Medication Assistance: None required.  Patient affirms current coverage meets needs.  Medication Access: Within the past 30 days, how often has patient missed a dose of medication? 0 Is a pillbox or other method used to improve adherence? Yes  Factors that may affect medication adherence? no barriers identified Are meds synced by current pharmacy? No  Are meds delivered by current pharmacy? Yes  Does patient experience delays in picking up medications due to transportation concerns? No   [facility] Reviewed: Is patient disadvantaged to use [facility]?: Yes  Current Rx insurance plan: [place] Name and location of Current pharmacy:  [facility] [place], [place] [address] Phone: [phone] Fax: [phone]  [facility] [id number] - [address], [place] AT [facility] [address] [place] [address] Phone: [phone] Fax: [place], [place] - [address] AT [place] [phone] [place] [address] Phone: [phone] Fax: [phone]  [facility] services reviewed with patient today?: Yes  Patient requests to transfer care to [facility]?: No  Reason patient declined to change pharmacies: Disadvantaged due to insurance/mail order  Compliance/Adherence/Medication fill history: Atorvastatin 40 mg last filled 08/06/2022 90 DS Losartan 25 mg last filled 09/27/2022 90 DS     Care Gaps: Annual wellness visit in last year? Yes   Assessment/Plan     Hypertension (BP goal  <130/80) -Controlled -Current treatment: Amlodipine 1.25mg  qpm and additional half tablet if BP > 165/100 Appropriate, Effective, Safe, Accessible -Medications previously tried: losartan, metoprolol  -Current home readings: 120-140/80-90 -Current dietary habits: eats pretty well, watches salt -Current exercise habits: walks the dog in the back yard most days -Denies hypotensive/hypertensive symptoms -Educated on BP goals and benefits of medications for prevention of heart attack, stroke and kidney damage; Importance of home blood pressure monitoring; Symptoms of hypotension and importance of maintaining adequate hydration; -Counseled to monitor BP at home daily, document, and provide log at future appointments -Recommended to continue current medication Contact providers with consistent blood pressure above goal.  Hyperlipidemia: (LDL goal < 100) -Controlled -Current treatment: Rosuvastatin 40mg  Appropriate, Effective, Query Safe, -Medications previously tried: none noted  -Educated on Cholesterol goals;  Benefits of statin for ASCVD risk reduction; Importance of limiting foods high in cholesterol; -Most recent CrCl is right at 50ml/min based on Cockroft-Gault calculation. -Max dose of 5-10 mg per day of Crestor recommended for CrCl < 30.  Patient needs updated labs to calculate current CrCl - if below 30 could consider switch to Atorvastatin which does not require renal dose adjustments.  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  She received radiation.  Feels she is recovering well and is in a good place mentally.  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  No changes at this time - will continue to provide supportive  care.  -Recommended continue current management - keep visits with oncology.   Depression/Anxiety (Goal: Minimize symptoms) -Controlled -Current treatment: Duloxetine 30mg  two tabs AM, one tab PM Appropriate, Effective, Safe, Accessible -Medications previously tried/failed: lorazepam, zoloft -PHQ9:  PHQ9 SCORE ONLY 06/04/2021 05/20/2021 02/09/2021  PHQ-9 Total Score 0 14 5   -GAD7:  GAD 7 : Generalized Anxiety Score 02/09/2017  Nervous, Anxious, on Edge 3  Control/stop worrying 3  Worry too much - different things 3  Trouble relaxing 0  Restless 2  Easily annoyed or irritable 2  Afraid - awful might happen 3  Total GAD 7 Score 16  Anxiety Difficulty Very difficult   -Reports mood stable, sleeping well with Ambien -Educated on Benefits of medication for symptom control -Recommended to continue current medication  Osteoporosis / Osteopenia (Goal Prevent Fracture) 10/25/22 -Controlled -Last DEXA Scan: 03/23/21  - will be due August of this year  T-Score femoral neck: -2.2  -Patient is a candidate for pharmacologic treatment due to previous DEXA indicating treatment -Current treatment  Prolia 60mg  every 6 months Appropriate, Effective, Safe, Accessible -Medications previously tried: none noted  -Recommend [phone] units of vitamin D daily. Recommend 1200 mg of calcium daily from dietary and supplemental sources. Recommend weight-bearing and muscle strengthening exercises for building and maintaining bone density. -She continues with Prolia.  Stressed importance of continuing this medication.  No changes needed at this time but will be due to repeat DEXA in August of 2024.  Patient Goals/Self-Care Activities Patient will:  - take medications as prescribed as evidenced by patient report and record review focus on medication  adherence by pill box check blood pressure daily, document, and provide at future appointments  Follow Up Plan: The care management team will reach out to the  patient again over the next 365 days.        Beverly Milch, PharmD Clinical Pharmacist  [facility] [phone]

## 2022-10-25 ENCOUNTER — Encounter: Payer: Self-pay | Admitting: Radiation Oncology

## 2022-10-25 ENCOUNTER — Other Ambulatory Visit: Payer: Self-pay

## 2022-10-25 ENCOUNTER — Ambulatory Visit
Admission: RE | Admit: 2022-10-25 | Discharge: 2022-10-25 | Disposition: A | Discharge status: Home / Self Care | Payer: Medicare Other | Source: Ambulatory Visit | Attending: Radiation Oncology | Admitting: Radiation Oncology

## 2022-10-25 DIAGNOSIS — C37 Malignant neoplasm of thymus: Secondary | ICD-10-CM | POA: Diagnosis not present

## 2022-10-25 DIAGNOSIS — Z510 Encounter for antineoplastic radiation therapy: Secondary | ICD-10-CM | POA: Diagnosis not present

## 2022-10-25 LAB — RAD ONC ARIA SESSION SUMMARY
Course Elapsed Days: 41
Plan Fractions Treated to Date: 30
Plan Prescribed Dose Per Fraction: 1.8 Gy
Plan Total Fractions Prescribed: 30
Plan Total Prescribed Dose: 54 Gy
Reference Point Dosage Given to Date: 54 Gy
Reference Point Session Dosage Given: 1.8 Gy
Session Number: 30

## 2022-10-27 ENCOUNTER — Ambulatory Visit (INDEPENDENT_AMBULATORY_CARE_PROVIDER_SITE_OTHER): Payer: Medicare Other | Admitting: Psychology

## 2022-10-27 DIAGNOSIS — F331 Major depressive disorder, recurrent, moderate: Secondary | ICD-10-CM

## 2022-10-27 NOTE — Progress Notes (Signed)
Reserve Counselor Initial Adult Exam  Name: Sandra Craig Date: 10/27/2022 MRN: [id number] DOB: 1940/12/03 PCP: [name], MD  Time spent: 11:00am-11:50am   50 minutes  Guardian/Payee:  Crista Curb requested: No   Reason for Visit /Presenting Problem: Pt present for face-to-face initial assessment update via video Webex.  Pt consents to telehealth video session due to COVID 19 pandemic. Location of pt: home Location of therapist: home office.  Pt has had symptoms of depression and anxiety.   Pt has a history of depression.   Pt states she has been having a lot of physical issues.   Pt has chronic pain issues.  She has cancer and is getting radiation treatment.  Pt got married about 2 years and a half ago and is not happy in her marriage.   Pt moved from her home of 11 years to her husband's house last year.  She feels trapped and stuck.   Pt state her husband [name] does not change anything he was doing before she lived with him.  He plays the tv very loud which really bothers pt.   They got a new dog 2 years ago.  The dog needs training and pt and husband disagree about training styles.  They have conflict. Pt's husband had throat cancer that he has been treated for.   Pt worries bc she had to sign a prenup agreement and if her husband dies would have to move out of the house within 6 months.  Nothing is left to pt if her husband dies.  Pt feels resentful about this.  Pt does not feel at home in her husband's house.   Pt lost 2 siblings in the past couple of years.   Pt was very close to them.  Her sister was her best friend and her death was a big loss for pt.  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  Pt attends AA. Reviewed pt's treatment plan for annual update.   Updated pt's treatment plan and IA.   Pt participated in setting treatment goals.  Plan to meet every 2 weeks.     Mental Status Exam: Appearance:   Casual     Behavior:   Appropriate  Motor:  Normal  Speech/Language:   Normal Rate  Affect:  Appropriate  Mood:  normal  Thought process:  normal  Thought content:    WNL  Sensory/Perceptual disturbances:    WNL  Orientation:  oriented to person, place, time/date, and situation  Attention:  Good  Concentration:  Good  Memory:  WNL  Fund of knowledge:   Good  Insight:    Good  Judgment:   Good  Impulse Control:  Good    Reported Symptoms:  anxiety, sadness  Risk Assessment: Danger to Self:  No Self-injurious Behavior: No Danger to Others: No Duty to Warn:no Physical Aggression / Violence:No  Access to Firearms a concern: No  Gang Involvement:No  Patient / guardian was educated about steps to take if suicide or homicide risk level increases between visits: n/a While future psychiatric events cannot be accurately predicted, the patient does not currently require acute inpatient psychiatric care and does not currently meet [facility] involuntary commitment criteria.  Substance Abuse History: Current substance abuse: No     Past Psychiatric History:   Previous psychological history is significant for depression Outpatient Providers:pt has been in therapy in the past. History of Psych Hospitalization: No  Psychological  Testing:  n/a    Abuse History:  Victim of: No.,  n/a    Report needed: No. Victim of Neglect:No. Perpetrator of  n/a   Witness / Exposure to Domestic Violence: No   Protective Services Involvement: No  Witness to [employer] Violence:  No   Family History:  Family History  Problem Relation Age of Onset   Heart disease Mother        MI at age 61   Emphysema Mother    Thyroid disease Mother        Thyroidectomy/Benign   COPD Father    Pancreatic cancer Sister        died 27   Cancer Brother        adenocarcinoma right lung   COPD Brother    Lymphoma Maternal Grandmother    Colon cancer Paternal Grandmother    Heart attack Paternal Grandfather     Living  situation: the patient lives with her spouse.  Pt grew up with both parents.  Pt is the oldest of 6 kids.  Pt's father worked out of town a lot.  Pt's mother was "high strung". Family history of mental health issues:  mother had anxiety.  Pt's mother was alcoholic. No abuse Pt's mother passed away at 69 years old.    Sexual Orientation: Straight  Relationship Status: married  Name of spouse / other:[name].  [name] is a Civil engineer, contracting and still works.  If a parent, number of children / ages:none This is pt's 3rd marriage.   Her first marriage ended in divorce after 7 years and the second husband passed away in 11/06/2010 after they were married for 34 years.    Support Systems: spouse friends  Museum/gallery curator Stress:  No   Income/Employment/Disability: Actor: No   Educational History: Education: Scientist, product/process development: Protestant  Any cultural differences that may affect / interfere with treatment:  not applicable   Recreation/Hobbies: reading  Stressors: Health problems   Marital or family conflict    Strengths: Friends, Conservator, museum/gallery, and Able to Communicate Effectively  Barriers:  none   Legal History: Pending legal issue / charges: The patient has no significant history of legal issues. 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   Chronic insomnia    Chronic kidney disease    Cystitis    Depression    Diverticulitis of intestine without perforation or abscess without bleeding    Patient did have abscess but noperforation   Diverticulosis of colon (without mention of hemorrhage)    Endometriosis    Family history of malignant neoplasm of gastrointestinal tract    Fibromyalgia    Gastritis    Hiatal hernia    HIATAL HERNIA 10/08/2008    Qualifier: Diagnosis of  By: Nils Pyle CMA Deborra Medina), Mearl Latin     History of gallstones    HSV-1 infection    Hyperlipidemia    Hypertension    IBS (irritable bowel syndrome)    IC (interstitial cystitis)    Internal hemorrhoid    Mycobacterium avium complex ([place])    history- took antibiotics and completed course   Osteonecrosis ([place])    Osteopenia    Osteoporosis    Palpitations    Polycystic kidney disease    pt denies   Pulmonary nodule 07/2007   5  mm Anterior RUL   Small bowel obstruction ([place])    Stroke ([place]) 2021   TIA    Past Surgical History:  Procedure Laterality Date   ABDOMINAL HYSTERECTOMY  1994   TAH,BSO FOR ENDOMETRIOSIS   ABDOMINAL SURGERY  2011   small intestine blockage   CHOLECYSTECTOMY     GASTROPLASTY  2011   small bowel resection -open   INGUINAL HERNIA REPAIR Right 02/28/2020   Procedure: LAPAROSCOPIC RIGHT INGUINAL HERNIA REPAIR WITH MESH;  Surgeon: Ralene Ok, MD;  Location: [facility];  Service: General;  Laterality: Right;   JOINT REPLACEMENT  2008   OOPHORECTOMY  1994   TAH,BSO   PELVIC LAPAROSCOPY     S/P right shoulder rotater cuff  200216/2011   Tear/adhesive capsulitis   SBO Lap  11   Adhesions and small internal hernia   SHOULDER HEMI-ARTHROPLASTY  06/05/2012   Procedure: SHOULDER HEMI-ARTHROPLASTY;  Surgeon: Johnny Bridge, MD;  Location: [place];  Service: Orthopedics;  Laterality: Right;  FOR ARTHRITIS   TOTAL HIP ARTHROPLASTY  FALL OF 2008   rt. partial hip replacement   TOTAL SHOULDER ARTHROPLASTY  06/05/2012   Procedure: TOTAL SHOULDER ARTHROPLASTY;  Surgeon: Johnny Bridge, MD;  Location: [place];  Service: Orthopedics;  Laterality: Right;  RIGHT SHOULDER TOTAL ARTHROPLASTY, HEMIARTHROPLASTY, SHOULDER, FOR ARTHRITIS   VIDEO BRONCHOSCOPY Bilateral 01/28/2015   Procedure: VIDEO BRONCHOSCOPY WITH FLUORO;  Surgeon: Collene Gobble, MD;  Location: [place];  Service: Cardiopulmonary;  Laterality: Bilateral;   VIDEO BRONCHOSCOPY Bilateral  04/23/2019   Procedure: VIDEO BRONCHOSCOPY WITHOUT FLUORO;  Surgeon: Collene Gobble, MD;  Location[facility] ENDOSCOPY;  Service: Cardiopulmonary;  Laterality: Bilateral;    Medications: Current Outpatient Medications  Medication Sig Dispense Refill   aspirin 81 MG tablet Take 1 tablet (81 mg total) by mouth daily. 30 tablet    atorvastatin (LIPITOR) 40 MG tablet Take 1 tablet (40 mg total) by mouth daily. 90 tablet 3   Calcium Citrate-Vitamin D (CALCIUM + D PO) Take 1 tablet by mouth 2 (two) times daily.     DULoxetine (CYMBALTA) 30 MG capsule TAKE ONE CAPSULE BY MOUTH TWICE DAILY 180 capsule 3   famotidine (PEPCID) 20 MG tablet TAKE 1 TABLET(20 MG) BY MOUTH TWICE DAILY 180 tablet 1   gabapentin (NEURONTIN) 300 MG capsule TAKE 1 CAPSULE(300 MG) BY MOUTH TWICE DAILY AS NEEDED FOR PAIN (Patient taking differently: Take 300 mg by mouth 2 (two) times daily.) 180 capsule 3   losartan (COZAAR) 25 MG tablet Take 12.5 mg by mouth daily.     Multiple Vitamins-Minerals (MULTIVITAMIN WITH MINERALS) tablet Take 1 tablet by mouth daily.     Polyethyl Glycol-Propyl Glycol (SYSTANE) 0.4-0.3 % GEL ophthalmic gel Place 1 application into both eyes 3 (three) times daily as needed (dry eye).     sucralfate (CARAFATE) 1 g tablet Take 1 tablet (1 g total) by mouth 4 (four) times daily. Dissolve each tablet in 15 cc water before use. 120 tablet 2   zolpidem (AMBIEN) 5 MG tablet TAKE 1 TABLET BY MOUTH EVERY DAY AT BEDTIME FOR SLEEP 90 tablet 1   No current facility-administered medications for this visit.    Allergies  Allergen Reactions   Azithromycin Rash    Pruritic rash diffuse     Celecoxib Nausea Only    Diagnoses:  F33.1  Plan of Care: Recommend ongoing therapy.   Pt participated in setting treatment goals.  Pt wants to have someone to talk to and to improve  coping skills.  Pt wants to feel less anxious and depressed.  Plan to meet every two weeks.    Treatment Plan (Treatment Plan Target Date:  10/27/2023) Client Abilities/Strengths  Pt is bright, engaging, and motivated for therapy.   Client Treatment Preferences  Individual therapy.  Client Statement of Needs  Improve coping skills.  Symptoms  Depressed or irritable mood. Diminished interest in or enjoyment of activities. Lack of energy. Feelings of hopelessness, worthlessness, or inappropriate guilt. Unresolved grief issues.   Problems Addressed  Unipolar Depression Goals 1. Alleviate depressive symptoms and return to previous level of effective functioning. 2. Appropriately grieve the loss in order to normalize mood and to return to previously adaptive level of functioning. Objective Learn and implement behavioral strategies to overcome depression. Target Date: 2023-10-27 Frequency: Biweekly  Progress: 30 Modality: individual  Related Interventions Engage the client in "behavioral activation," increasing his/her activity level and contact with sources of reward, while identifying processes that inhibit activation.  Use behavioral techniques such as instruction, rehearsal, role-playing, role reversal, as needed, to facilitate activity in the client's daily life; reinforce success. Assist the client in developing skills that increase the likelihood of deriving pleasure from behavioral activation (e.g., assertiveness skills, developing an exercise plan, less internal/more external focus, increased social involvement); reinforce success. Objective Identify important people in life, past and present, and describe the quality, good and poor, of those relationships. Target Date: 2023-10-27 Frequency: Biweekly  Progress: 30 Modality: individual  Related Interventions Conduct Interpersonal Therapy beginning with the assessment of the client's "interpersonal inventory" of important past and present relationships; develop a case formulation linking depression to grief, interpersonal role disputes, role transitions, and/or  interpersonal deficits). Objective Learn and implement problem-solving and decision-making skills. Target Date: 2023-10-27 Frequency: Biweekly  Progress: 30 Modality: individual  Related Interventions Conduct Problem-Solving Therapy using techniques such as psychoeducation, modeling, and role-playing to teach client problem-solving skills (i.e., defining a problem specifically, generating possible solutions, evaluating the pros and cons of each solution, selecting and implementing a plan of action, evaluating the efficacy of the plan, accepting or revising the plan); role-play application of the problem-solving skill to a real life issue. Encourage in the client the development of a positive problem orientation in which problems and solving them are viewed as a natural part of life and not something to be feared, despaired, or avoided. 3. Develop healthy interpersonal relationships that lead to the alleviation and help prevent the relapse of depression. 4. Develop healthy thinking patterns and beliefs about self, others, and the world that lead to the alleviation and help prevent the relapse of depression. 5. Recognize, accept, and cope with feelings of depression. Diagnosis F33.1  Conditions For Discharge Achievement of treatment goals and objectives    [name], LCSW

## 2022-10-31 NOTE — Progress Notes (Signed)
  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   Narrative: The patient tolerated radiation treatment relatively well.   The patient did experience esophagitis during the course of treatment which required management with Carafate.  Plan: The patient will receive a call in about one month from the radiation oncology department. She will continue follow up with Dr. Julien Nordmann as well.      Carola Rhine, PAC

## 2022-11-06 DIAGNOSIS — S161XXA Strain of muscle, fascia and tendon at neck level, initial encounter: Secondary | ICD-10-CM | POA: Diagnosis not present

## 2022-11-06 DIAGNOSIS — S29012A Strain of muscle and tendon of back wall of thorax, initial encounter: Secondary | ICD-10-CM | POA: Diagnosis not present

## 2022-11-06 DIAGNOSIS — M50322 Other cervical disc degeneration at C5-C6 level: Secondary | ICD-10-CM | POA: Diagnosis not present

## 2022-11-06 DIAGNOSIS — M549 Dorsalgia, unspecified: Secondary | ICD-10-CM | POA: Diagnosis not present

## 2022-11-06 DIAGNOSIS — M542 Cervicalgia: Secondary | ICD-10-CM | POA: Diagnosis not present

## 2022-11-08 DIAGNOSIS — R490 Dysphonia: Secondary | ICD-10-CM | POA: Diagnosis not present

## 2022-11-08 DIAGNOSIS — J3800 Paralysis of vocal cords and larynx, unspecified: Secondary | ICD-10-CM | POA: Diagnosis not present

## 2022-11-09 DIAGNOSIS — R490 Dysphonia: Secondary | ICD-10-CM | POA: Diagnosis not present

## 2022-11-09 DIAGNOSIS — J3800 Paralysis of vocal cords and larynx, unspecified: Secondary | ICD-10-CM | POA: Diagnosis not present

## 2022-11-10 ENCOUNTER — Ambulatory Visit: Payer: Medicare Other | Admitting: Psychology

## 2022-11-24 ENCOUNTER — Ambulatory Visit (INDEPENDENT_AMBULATORY_CARE_PROVIDER_SITE_OTHER): Payer: Medicare Other | Admitting: Psychology

## 2022-11-24 DIAGNOSIS — F331 Major depressive disorder, recurrent, moderate: Secondary | ICD-10-CM | POA: Diagnosis not present

## 2022-11-24 NOTE — Progress Notes (Signed)
[facility] Counselor/Therapist Progress Note  Patient ID: EMARIE PAUL, MRN: [id number],    Date: 11/24/2022  Time Spent: 11:00am-11:50am    50 minutes   Treatment Type: Individual Therapy  Reported Symptoms: stress  Mental Status Exam: Appearance:  Casual     Behavior: Appropriate  Motor: Normal  Speech/Language:  Normal Rate  Affect: Appropriate  Mood: normal  Thought process: normal  Thought content:   WNL  Sensory/Perceptual disturbances:   WNL  Orientation: oriented to person, place, time/date, and situation  Attention: Good  Concentration: Good  Memory: WNL  Fund of knowledge:  Good  Insight:   Good  Judgment:  Good  Impulse Control: Good   Risk Assessment: Danger to Self:  No Self-injurious Behavior: No Danger to Others: No Duty to Warn:no Physical Aggression / Violence:No  Access to Firearms a concern: No  Gang Involvement:No   Subjective: Pt present for face-to-face individual therapy via video.  Pt consents to telehealth video session due to COVID 19 pandemic. Location of pt: home Location of therapist: home office.   Pt talked about her health.   She has finished her radiation cancer treatments.  She is relieved to be done with that.  Pt has had issues with feeling dizzy lately.   Encouraged pt to contact her PCP.   Pt talked about concerns about her husband [name] who may have nodules in his lungs that could be cancer.  Addressed pt's concerns about [name]. Pt has decided to put on hold any decisions about her living situation.  Worked on self care strategies.  Provided supportive therapy.   Interventions: Cognitive Behavioral Therapy and Insight-Oriented  Diagnosis: F33.1  Plan of Care: Recommend ongoing therapy.   Pt participated in setting treatment goals.  Pt wants to have someone to talk to and to improve coping skills.  Pt wants to feel less anxious and depressed.  Plan to meet every two weeks.    Treatment Plan (Treatment  Plan Target Date: 10/27/2023) Client Abilities/Strengths  Pt is bright, engaging, and motivated for therapy.   Client Treatment Preferences  Individual therapy.  Client Statement of Needs  Improve coping skills.  Symptoms  Depressed or irritable mood. Diminished interest in or enjoyment of activities. Lack of energy. Feelings of hopelessness, worthlessness, or inappropriate guilt. Unresolved grief issues.   Problems Addressed  Unipolar Depression Goals 1. Alleviate depressive symptoms and return to previous level of effective functioning. 2. Appropriately grieve the loss in order to normalize mood and to return to previously adaptive level of functioning. Objective Learn and implement behavioral strategies to overcome depression. Target Date: 2023-10-27 Frequency: Biweekly  Progress: 30 Modality: individual  Related Interventions Engage the client in "behavioral activation," increasing his/her activity level and contact with sources of reward, while identifying processes that inhibit activation.  Use behavioral techniques such as instruction, rehearsal, role-playing, role reversal, as needed, to facilitate activity in the client's daily life; reinforce success. Assist the client in developing skills that increase the likelihood of deriving pleasure from behavioral activation (e.g., assertiveness skills, developing an exercise plan, less internal/more external focus, increased social involvement); reinforce success. Objective Identify important people in life, past and present, and describe the quality, good and poor, of those relationships. Target Date: 2023-10-27 Frequency: Biweekly  Progress: 30 Modality: individual  Related Interventions Conduct Interpersonal Therapy beginning with the assessment of the client's "interpersonal inventory" of important past and present relationships; develop a case formulation linking depression to grief, interpersonal role disputes, role transitions,  and/or  interpersonal deficits). Objective Learn and implement problem-solving and decision-making skills. Target Date: 2023-10-27 Frequency: Biweekly  Progress: 30 Modality: individual  Related Interventions Conduct Problem-Solving Therapy using techniques such as psychoeducation, modeling, and role-playing to teach client problem-solving skills (i.e., defining a problem specifically, generating possible solutions, evaluating the pros and cons of each solution, selecting and implementing a plan of action, evaluating the efficacy of the plan, accepting or revising the plan); role-play application of the problem-solving skill to a real life issue. Encourage in the client the development of a positive problem orientation in which problems and solving them are viewed as a natural part of life and not something to be feared, despaired, or avoided. 3. Develop healthy interpersonal relationships that lead to the alleviation and help prevent the relapse of depression. 4. Develop healthy thinking patterns and beliefs about self, others, and the world that lead to the alleviation and help prevent the relapse of depression. 5. Recognize, accept, and cope with feelings of depression. Diagnosis F33.1  Conditions For Discharge Achievement of treatment goals and objectives   Salomon Fick, LCSW

## 2022-11-30 ENCOUNTER — Ambulatory Visit (INDEPENDENT_AMBULATORY_CARE_PROVIDER_SITE_OTHER): Payer: Medicare Other | Admitting: Family Medicine

## 2022-11-30 ENCOUNTER — Encounter: Payer: Self-pay | Admitting: Family Medicine

## 2022-11-30 VITALS — BP 128/74 | HR 89 | Temp 97.9°F | Ht 64.0 in | Wt 140.8 lb

## 2022-11-30 DIAGNOSIS — Z131 Encounter for screening for diabetes mellitus: Secondary | ICD-10-CM | POA: Diagnosis not present

## 2022-11-30 DIAGNOSIS — D649 Anemia, unspecified: Secondary | ICD-10-CM | POA: Diagnosis not present

## 2022-11-30 DIAGNOSIS — E785 Hyperlipidemia, unspecified: Secondary | ICD-10-CM | POA: Diagnosis not present

## 2022-11-30 DIAGNOSIS — F3342 Major depressive disorder, recurrent, in full remission: Secondary | ICD-10-CM | POA: Insufficient documentation

## 2022-11-30 DIAGNOSIS — N1832 Chronic kidney disease, stage 3b: Secondary | ICD-10-CM | POA: Diagnosis not present

## 2022-11-30 DIAGNOSIS — Z0000 Encounter for general adult medical examination without abnormal findings: Secondary | ICD-10-CM

## 2022-11-30 DIAGNOSIS — R739 Hyperglycemia, unspecified: Secondary | ICD-10-CM

## 2022-11-30 DIAGNOSIS — I10 Essential (primary) hypertension: Secondary | ICD-10-CM | POA: Diagnosis not present

## 2022-11-30 LAB — CBC WITH DIFFERENTIAL/PLATELET
Basophils Absolute: 0 10*3/uL (ref 0.0–0.1)
Basophils Relative: 0.8 % (ref 0.0–3.0)
Eosinophils Absolute: 0.3 10*3/uL (ref 0.0–0.7)
Eosinophils Relative: 6.6 % — ABNORMAL HIGH (ref 0.0–5.0)
HCT: 35.7 % — ABNORMAL LOW (ref 36.0–46.0)
Hemoglobin: 11.8 g/dL — ABNORMAL LOW (ref 12.0–15.0)
Lymphocytes Relative: 20.3 % (ref 12.0–46.0)
Lymphs Abs: 1 10*3/uL (ref 0.7–4.0)
MCHC: 33 g/dL (ref 30.0–36.0)
MCV: 87.5 fl (ref 78.0–100.0)
Monocytes Absolute: 0.5 10*3/uL (ref 0.1–1.0)
Monocytes Relative: 10.2 % (ref 3.0–12.0)
Neutro Abs: 3.1 10*3/uL (ref 1.4–7.7)
Neutrophils Relative %: 62.1 % (ref 43.0–77.0)
Platelets: 198 10*3/uL (ref 150.0–400.0)
RBC: 4.07 Mil/uL (ref 3.87–5.11)
RDW: 15.2 % (ref 11.5–15.5)
WBC: 5.1 10*3/uL (ref 4.0–10.5)

## 2022-11-30 LAB — COMPREHENSIVE METABOLIC PANEL
ALT: 18 U/L (ref 0–35)
AST: 25 U/L (ref 0–37)
Albumin: 3.6 g/dL (ref 3.5–5.2)
Alkaline Phosphatase: 66 U/L (ref 39–117)
BUN: 28 mg/dL — ABNORMAL HIGH (ref 6–23)
CO2: 30 mEq/L (ref 19–32)
Calcium: 10.3 mg/dL (ref 8.4–10.5)
Chloride: 104 mEq/L (ref 96–112)
Creatinine, Ser: 2.01 mg/dL — ABNORMAL HIGH (ref 0.40–1.20)
GFR: 23.01 mL/min — ABNORMAL LOW (ref >60.00)
Glucose, Bld: 87 mg/dL (ref 70–99)
Potassium: 4.2 mEq/L (ref 3.5–5.1)
Sodium: 143 mEq/L (ref 135–145)
Total Bilirubin: 0.4 mg/dL (ref 0.2–1.2)
Total Protein: 6.1 g/dL (ref 6.0–8.3)

## 2022-11-30 LAB — IBC + FERRITIN
Ferritin: 27 ng/mL (ref 10.0–291.0)
Iron: 77 ug/dL (ref 42–145)
Saturation Ratios: 24.9 % (ref 20.0–50.0)
TIBC: 309.4 ug/dL (ref 250.0–450.0)
Transferrin: 221 mg/dL (ref 212.0–360.0)

## 2022-11-30 LAB — LIPID PANEL
Cholesterol: 143 mg/dL (ref 0–200)
HDL: 43.9 mg/dL (ref >39.00)
LDL Cholesterol: 68 mg/dL (ref 0–99)
NonHDL: 99.27
Total CHOL/HDL Ratio: 3
Triglycerides: 156 mg/dL — ABNORMAL HIGH (ref 0.0–149.0)
VLDL: 31.2 mg/dL (ref 0.0–40.0)

## 2022-11-30 LAB — HEMOGLOBIN A1C: Hgb A1c MFr Bld: 6 % (ref 4.6–6.5)

## 2022-11-30 NOTE — Progress Notes (Signed)
Phone [phone]   Subjective:  Patient presents today for their annual physical. Chief complaint-noted.   See problem oriented charting- ROS- full  review of systems was completed and negative except for: fatigue, hoarseness, trouble swallowing after treatments, cough, shortness of breath, back pain, lightheadedness particularly with standing- has to be careful   The following were reviewed and entered/updated in epic: 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   Chronic insomnia    Chronic kidney disease    Cystitis    Depression    Diverticulitis of intestine without perforation or abscess without bleeding    Patient did have abscess but noperforation   Diverticulosis of colon (without mention of hemorrhage)    Endometriosis    Family history of malignant neoplasm of gastrointestinal tract    Fibromyalgia    Gastritis    Hiatal hernia    HIATAL HERNIA 10/08/2008   Qualifier: Diagnosis of  By: Koleen Distance CMA (AAMA), Hulan Saas     History of gallstones    HSV-1 infection    Hyperlipidemia    Hypertension    IBS (irritable bowel syndrome)    IC (interstitial cystitis)    Internal hemorrhoid    Mycobacterium avium complex (HCC)    history- took antibiotics and completed course   Osteonecrosis (HCC)    Osteopenia    Osteoporosis    Palpitations    Polycystic kidney disease    pt denies   Pulmonary nodule 07/2007   5 mm Anterior RUL   Small bowel obstruction (HCC)    Stroke (HCC) 2021   TIA   Patient Active Problem List   Diagnosis Date Noted   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    Priority: High   History of transient ischemic attack (TIA) 06/02/2017    Priority: High   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    Priority: High   Chronic low back pain 12/20/2013    Priority: High   Fibromyalgia 12/05/2007    Priority: High   UTI (urinary tract  infection) 12/15/2018    Priority: Medium    Chronic kidney disease, stage 3b (HCC) 06/02/2017    Priority: Medium    Near syncope 03/11/2017    Priority: Medium    Hyperglycemia 06/27/2012    Priority: Medium    History of small bowel obstruction 06/28/2011    Priority: Medium    GERD with stricture 06/28/2011    Priority: Medium    Hypertension     Priority: Medium    Hyperlipidemia     Priority: Medium    INSOMNIA, CHRONIC 10/08/2008    Priority: Medium    Depression 02/08/2008    Priority: Medium    Facet arthropathy 09/14/2019    Priority: Low   Vaginal dryness 01/29/2019    Priority: Low   Chronic right shoulder pain 03/07/2014    Priority: Low   Benign essential tremor 10/09/2013    Priority: Low   Diverticulitis large intestine 02/11/2012    Priority: Low   Irritable bowel syndrome (IBS) 06/28/2011    Priority: Low   IC (interstitial cystitis)     Priority: Low   Hemorrhoids 10/08/2008    Priority: Low   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    Priority: Low   HEMATURIA UNSPECIFIED 07/04/2008    Priority: Low   Lung nodules 08/09/2007    Priority: Low   Major depressive  disorder, recurrent, in full remission (HCC) 11/30/2022   Chest pain 07/03/2022   Memory loss 07/21/2020   Past Surgical History:  Procedure Laterality Date   ABDOMINAL HYSTERECTOMY  1994   TAH,BSO FOR ENDOMETRIOSIS   ABDOMINAL SURGERY  2011   small intestine blockage   CHOLECYSTECTOMY     GASTROPLASTY  2011   small bowel resection -open   INGUINAL HERNIA REPAIR Right 02/28/2020   Procedure: LAPAROSCOPIC RIGHT INGUINAL HERNIA REPAIR WITH MESH;  Surgeon: Axel Filler, MD;  Location: [facility] OR;  Service: General;  Laterality: Right;   JOINT REPLACEMENT  2008   OOPHORECTOMY  1994   TAH,BSO   PELVIC LAPAROSCOPY     S/P right shoulder rotater cuff  200216/2011   Tear/adhesive capsulitis   SBO Lap  11   Adhesions and small internal hernia   SHOULDER HEMI-ARTHROPLASTY   06/05/2012   Procedure: SHOULDER HEMI-ARTHROPLASTY;  Surgeon: Eulas Post, MD;  Location: [facility] OR;  Service: Orthopedics;  Laterality: Right;  FOR ARTHRITIS   TOTAL HIP ARTHROPLASTY  FALL OF 2008   rt. partial hip replacement   TOTAL SHOULDER ARTHROPLASTY  06/05/2012   Procedure: TOTAL SHOULDER ARTHROPLASTY;  Surgeon: Eulas Post, MD;  Location: [facility] OR;  Service: Orthopedics;  Laterality: Right;  RIGHT SHOULDER TOTAL ARTHROPLASTY, HEMIARTHROPLASTY, SHOULDER, FOR ARTHRITIS   VIDEO BRONCHOSCOPY Bilateral 01/28/2015   Procedure: VIDEO BRONCHOSCOPY WITH FLUORO;  Surgeon: Leslye Peer, MD;  Location: [facility] ENDOSCOPY;  Service: Cardiopulmonary;  Laterality: Bilateral;   VIDEO BRONCHOSCOPY Bilateral 04/23/2019   Procedure: VIDEO BRONCHOSCOPY WITHOUT FLUORO;  Surgeon: Leslye Peer, MD;  Location: [facility] ENDOSCOPY;  Service: Cardiopulmonary;  Laterality: Bilateral;    Family History  Problem Relation Age of Onset   Heart disease Mother        MI at age 61   Emphysema Mother    Thyroid disease Mother        Thyroidectomy/Benign   COPD Father    Pancreatic cancer Sister        died 36   Cancer Brother        adenocarcinoma right lung   COPD Brother    Lymphoma Maternal Grandmother    Colon cancer Paternal Grandmother    Heart attack Paternal Grandfather     Medications- reviewed and updated Current Outpatient Medications  Medication Sig Dispense Refill   aspirin 81 MG tablet Take 1 tablet (81 mg total) by mouth daily. 30 tablet    atorvastatin (LIPITOR) 40 MG tablet Take 1 tablet (40 mg total) by mouth daily. 90 tablet 3   Calcium Citrate-Vitamin D (CALCIUM + D PO) Take 1 tablet by mouth 2 (two) times daily.     DULoxetine (CYMBALTA) 30 MG capsule TAKE ONE CAPSULE BY MOUTH TWICE DAILY 180 capsule 3   famotidine (PEPCID) 20 MG tablet TAKE 1 TABLET(20 MG) BY MOUTH TWICE DAILY 180 tablet 1   gabapentin (NEURONTIN) 300 MG capsule TAKE 1 CAPSULE(300 MG) BY MOUTH TWICE DAILY AS NEEDED FOR PAIN  (Patient taking differently: Take 300 mg by mouth 2 (two) times daily.) 180 capsule 3   losartan (COZAAR) 25 MG tablet Take 12.5 mg by mouth daily.     Multiple Vitamins-Minerals (MULTIVITAMIN WITH MINERALS) tablet Take 1 tablet by mouth daily.     Polyethyl Glycol-Propyl Glycol (SYSTANE) 0.4-0.3 % GEL ophthalmic gel Place 1 application into both eyes 3 (three) times daily as needed (dry eye).     sucralfate (CARAFATE) 1 g tablet Take 1 tablet (1 g total)  by mouth 4 (four) times daily. Dissolve each tablet in 15 cc water before use. 120 tablet 2   zolpidem (AMBIEN) 5 MG tablet TAKE 1 TABLET BY MOUTH EVERY DAY AT BEDTIME FOR SLEEP 90 tablet 1   No current facility-administered medications for this visit.    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 Married to Long term boyfriend 2 years prior 2021. Prior Widowed.       Enjoys reading    Objective  Objective:  BP 128/74   Pulse 89   Temp 97.9 F (36.6 C)   Ht 5\' 4"  (1.626 m)   Wt 140 lb 12.8 oz (63.9 kg)   LMP 08/25/1992   SpO2 94%   BMI 24.17 kg/m  Gen: NAD, resting comfortably HEENT: Mucous membranes are moist. Oropharynx normal Neck: no thyromegaly CV: RRR no murmurs rubs or gallops Lungs: CTAB no crackles, wheeze, rhonchi Abdomen: soft/nontender/nondistended/normal bowel sounds. No rebound or guarding.  Ext: no edema Skin: warm, dry Neuro: grossly normal, moves all extremities, PERRLA   Assessment and Plan   82 y.o. female presenting for annual physical.  Health Maintenance counseling: 1. Anticipatory guidance: Patient counseled regarding regular dental exams -q6 months, eye exams - yearly,  avoiding smoking and second hand smoke , limiting alcohol to 1 beverage per day- no alcohol since 1970s , no  illicit drugs .   2. Risk factor reduction:  Advised patient of need for regular exercise and diet rich and fruits and vegetables to reduce risk of heart attack and stroke.  Exercise- doing some walking - some physical therapy planned next week.  Diet/weight management-reasonably healthy some weight loss from esophagitis but has stabilized Wt Readings from Last 3 Encounters:  11/30/22 140 lb 12.8 oz (63.9 kg)  09/20/22 145 lb (65.8 kg)  09/09/22 140 lb (63.5 kg)  3. Immunizations/screenings/ancillary studies-discussed due for tetanus shot at pharmacy, declines COVID shot and Shingrix shot for now  Immunization History  Administered Date(s) Administered   Fluad Quad(high Dose 65+) 04/02/2019, 04/05/2020, 05/06/2021   Influenza Split 05/23/2012   Influenza Whole 05/31/2006, 05/28/2007, 05/14/2008, 04/22/2009, 05/01/2010, 04/12/2011   Influenza, High Dose Seasonal PF 05/19/2013, 03/26/2014, 04/21/2016, 04/21/2017, 04/21/2018, 04/02/2019, 05/06/2022   Influenza,inj,Quad PF,6+ Mos 04/15/2015   Influenza-Unspecified 04/29/2018   Moderna Sars-Covid-2 Vaccination 04/14/2021   PFIZER(Purple Top)SARS-COV-2 Vaccination 09/07/2019, 09/27/2019, 05/27/2020   Pneumococcal Conjugate-13 02/03/2015   Pneumococcal Polysaccharide-23 05/31/2006, 06/07/2012   Td 12/21/2009   Zoster Recombinat (Shingrix) 05/26/2022   Zoster, Live 02/06/2006  4. Cervical cancer screening- still sees GYN but technically past age based screening recommendations 5. Breast cancer screening-  breast exam with GYN and mammogram - prefers to continue but past age based screening recommendations - last done 05/24/22 6. Colon cancer screening - September 12, 2019 -6 precancerous polyps-plan had been for 3 years a down to discuss whether she needs repeat or not.  With other health issues recently she prefers to hold off for now-   7. Skin cancer screening-Dr. Gean Maidens dermatology yearly typically. advised regular sunscreen use.  Denies worrisome, changing, or new skin lesions.  8. Birth control/STD check-monogamous/postmenopausal 9. Osteoporosis screening at 65-known osteoporosis.  Continues Prolia injections every 6 months through GYN- except some delay with recent cancer treatments- trying to decide if she  can restart- she will call to get this restarted since finished treatments 10. Smoking associated screening -former smoker but quit in 1970s and no regular screening required  Status of chronic or acute concerns   # Left lower cord paralysis-treated with gabapentin 300 mg and plan to consider injection by [facility] in March 2024-likely injury during operation for thymectomy-she also completed radiation treatments October 25, 2022 for malignant neoplasm of the thymus  - has a phone call follow up on 15th but has noted some improvements -also doing carafate with prior esophagiatis- very helpful  # Depression/insomnia #fibromyalgia- also on gabapentin S: Medication: Cymbalta 30 mg twice daily, Ambien 5 mg causes vivid dreams at times Counseling: Working with Salomon Fick, LCSW in 2023 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 and continues     11/30/2022    8:52 AM 06/22/2022    9:59 AM 03/22/2022   11:01 AM  Depression screen PHQ 2/9  Decreased Interest 0 1 0  Down, Depressed, Hopeless 0 2 0  PHQ - 2 Score 0 3 0  Altered sleeping 0 0 0  Tired, decreased energy 3 0 0  Change in appetite 0 0 0  Feeling bad or failure about yourself  0 0 0  Trouble concentrating 1 1 1   Moving slowly or fidgety/restless 0 0 0  Suicidal thoughts 0 0 0  PHQ-9 Score 4 4 1   Difficult doing work/chores Somewhat difficult Not difficult at all Not difficult at all  A/P: full remission despite recent treatments- thrilled she is doing as well as she is- seems to be in good spirits- continue current medications   -will be 47 years alcohol free in June- congratulted her!   #hypertension with  orthostatic hypotension S: medication: Losartan 12.5 mg restarted by Dr. Charline Bills presyncope but was having elevated microalbumin to creatinine ratio compared to prior. No recent significant lightheadedness - Amlodipine 1.25 mg before bed in the past-stopped with presyncope/syncope issues A/P: stable- continue current medicines   #Chronic kidney disease stage III and on border with 4- sees Dr. Signe Colt S: GFR is typically in the 30srange -Patient knows to avoid NSAIDs  A/P:  hopefully stable- update cmp today. Continue losartan 12.5 mg  #hyperlipidemia/elevated LFT history #possible TIA history 06/2017- aspirin 81 mg S: Medication:atorvastatin 40 mg -in the past Rosuvastatin 40 mg ( change with creatinine clearance around 30-changed to atorvastatin) Lab Results  Component Value Date   CHOL 144 12/07/2021   HDL 62.10 12/07/2021   LDLCALC 63 12/07/2021   LDLDIRECT 59.0 11/03/2020   TRIG 97.0 12/07/2021   CHOLHDL 2 12/07/2021   A/P: hopefully stable- update cmp today. Continue current meds for now    # Hyperglycemia/insulin resistance/prediabetes-A1c as high as 5.06 February 2021 S:  Medication: none Lab Results  Component Value Date   HGBA1C 6.1 06/22/2022   HGBA1C 5.8 12/07/2021   HGBA1C 5.9 02/09/2021   A/P: hopefully stable- update a1c today. Continue without meds for now   Recommended follow up: Return in about 6 months (around 06/02/2023) for followup or sooner if needed.Schedule b4 you leave. Future Appointments  Date Time Provider Department Center  12/05/2022 11:00 AM Bauert, Rella Larve, LCSW [facility] None  12/12/2022  2:30 PM [facility] None  12/22/2022 11:00 AM Bauert, Rella Larve, LCSW [facility] None  12/29/2022 10:30 AM [facility] ANNUAL WELLNESS VISIT 1 [facility]  12/30/2022 11:00 AM [facility] LAB [facility] None  01/03/2023 11:00 AM Si Gaul, MD [facility] None  01/06/2023 12:00 PM Bauert, Rella Larve, LCSW [facility] None  10/30/2023  2:00 PM Willa Frater  L, [facility] None   Lab/Order associations:  fasting   ICD-10-CM   1. Preventative health care  Z00.00     2. Major depressive disorder, recurrent, in full remission (HCC)  F33.42     3. Chronic kidney disease, stage 3b (HCC) Chronic N18.32     4. Primary hypertension  I10     5. Hyperlipidemia, unspecified hyperlipidemia type  E78.5 CBC with Differential/Platelet    Comprehensive metabolic panel    Lipid panel    6. Hyperglycemia  R73.9 Hemoglobin A1c    7. Screening for diabetes mellitus  Z13.1 Hemoglobin A1c    8. Anemia, unspecified type  D64.9 IBC + Ferritin     No orders of the defined types were placed in this encounter.  Return precautions advised.  Tana Conch, MD

## 2022-11-30 NOTE — Patient Instructions (Addendum)
Let us know when you get your 2nd [facility] vaccine and anymore COVID vaccines at the pharmacy.  I am thrilled you are doing so well!   Please stop by lab before you go If you have mychart- we will send your results within 3 business days of [place] receiving them.  If you do not have mychart- we will call you about results within 5 business days of [place] receiving them.  *please also note that you will see labs on mychart as soon as they post. I will later go in and write notes on them- will say "notes from Dr. Durene Cal"

## 2022-12-05 ENCOUNTER — Ambulatory Visit: Payer: Medicare Other | Admitting: Psychology

## 2022-12-12 ENCOUNTER — Ambulatory Visit
Admission: RE | Admit: 2022-12-12 | Discharge: 2022-12-12 | Disposition: A | Discharge status: Home / Self Care | Payer: Medicare Other | Source: Ambulatory Visit | Attending: Radiation Oncology | Admitting: Radiation Oncology

## 2022-12-12 NOTE — Progress Notes (Signed)
  Radiation Oncology         [id number]) [phone] ________________________________  Name: Sandra Craig MRN: [id number]  Date of Service: 12/12/2022  DOB: [birth year]  Post Treatment Telephone Note  Diagnosis:  Curative        Radiation treatment dates:   09/14/22-10/25/22   Site/dose:   The patient was treated to the disease within the mediastinum initially to a dose of 54 Gy using a 4 field, 3-D conformal technique at 1.8 Gy per day. (as documented in provider EOT note)   The patient was available for call today.   Symptoms of fatigue have improved since completing therapy.  Symptoms of skin changes have improved since completing therapy.  Symptoms of difficulty swallowing have improved since completing therapy.    The patient has scheduled follow up with her medical oncologist Dr. Arbutus Ped  for ongoing surveillance, and with Dr. Basilio Cairo. The patient was encouraged to call if she develops concerns or questions regarding radiation.  This concludes the interview.   Ruel Favors, LPN

## 2022-12-13 DIAGNOSIS — R490 Dysphonia: Secondary | ICD-10-CM | POA: Diagnosis not present

## 2022-12-13 DIAGNOSIS — J3800 Paralysis of vocal cords and larynx, unspecified: Secondary | ICD-10-CM | POA: Diagnosis not present

## 2022-12-14 DIAGNOSIS — K080 Exfoliation of teeth due to systemic causes: Secondary | ICD-10-CM | POA: Diagnosis not present

## 2022-12-22 ENCOUNTER — Ambulatory Visit (INDEPENDENT_AMBULATORY_CARE_PROVIDER_SITE_OTHER): Payer: Medicare Other | Admitting: Psychology

## 2022-12-22 DIAGNOSIS — F331 Major depressive disorder, recurrent, moderate: Secondary | ICD-10-CM | POA: Diagnosis not present

## 2022-12-22 NOTE — Progress Notes (Signed)
[facility] Counselor/Therapist Progress Note  Patient ID: ALEIRA BURZINSKI, MRN: [id number],    Date: 12/22/2022  Time Spent: 2:00pm-2:50pm    50 minutes   Treatment Type: Individual Therapy  Reported Symptoms: stress  Mental Status Exam: Appearance:  Casual     Behavior: Appropriate  Motor: Normal  Speech/Language:  Normal Rate  Affect: Appropriate  Mood: normal  Thought process: normal  Thought content:   WNL  Sensory/Perceptual disturbances:   WNL  Orientation: oriented to person, place, time/date, and situation  Attention: Good  Concentration: Good  Memory: WNL  Fund of knowledge:  Good  Insight:   Good  Judgment:  Good  Impulse Control: Good   Risk Assessment: Danger to Self:  No Self-injurious Behavior: No Danger to Others: No Duty to Warn:no Physical Aggression / Violence:No  Access to Firearms a concern: No  Gang Involvement:No   Subjective: Pt present for face-to-face individual therapy via video.  Pt consents to telehealth video session due to COVID 19 pandemic. Location of pt: home Location of therapist: home office.   Pt talked about her health.   Her voice is still hoarse.  Pt is seeing a doctor to address the issue.   Pt talked about concerns about her husband [name] who is having health issues.   Addressed pt's concerns about [name]. Pt has still decided to put on hold any decisions about her living situation.  Pt is worried about her sisters who have had recent struggles.   Addressed pt's worries. Addressed how pt can connect more with friends and have things to look forward to.  Worked on self care strategies.  Provided supportive therapy.   Interventions: Cognitive Behavioral Therapy and Insight-Oriented  Diagnosis: F33.1  Plan of Care: Recommend ongoing therapy.   Pt participated in setting treatment goals.  Pt wants to have someone to talk to and to improve coping skills.  Pt wants to feel less anxious and depressed.  Plan  to meet every two weeks.    Treatment Plan (Treatment Plan Target Date: 10/27/2023) Client Abilities/Strengths  Pt is bright, engaging, and motivated for therapy.   Client Treatment Preferences  Individual therapy.  Client Statement of Needs  Improve coping skills.  Symptoms  Depressed or irritable mood. Diminished interest in or enjoyment of activities. Lack of energy. Feelings of hopelessness, worthlessness, or inappropriate guilt. Unresolved grief issues.   Problems Addressed  Unipolar Depression Goals 1. Alleviate depressive symptoms and return to previous level of effective functioning. 2. Appropriately grieve the loss in order to normalize mood and to return to previously adaptive level of functioning. Objective Learn and implement behavioral strategies to overcome depression. Target Date: 2023-10-27 Frequency: Biweekly  Progress: 30 Modality: individual  Related Interventions Engage the client in "behavioral activation," increasing his/her activity level and contact with sources of reward, while identifying processes that inhibit activation.  Use behavioral techniques such as instruction, rehearsal, role-playing, role reversal, as needed, to facilitate activity in the client's daily life; reinforce success. Assist the client in developing skills that increase the likelihood of deriving pleasure from behavioral activation (e.g., assertiveness skills, developing an exercise plan, less internal/more external focus, increased social involvement); reinforce success. Objective Identify important people in life, past and present, and describe the quality, good and poor, of those relationships. Target Date: 2023-10-27 Frequency: Biweekly  Progress: 30 Modality: individual  Related Interventions Conduct Interpersonal Therapy beginning with the assessment of the client's "interpersonal inventory" of important past and present relationships; develop a case formulation linking  depression  to grief, interpersonal role disputes, role transitions, and/or interpersonal deficits). Objective Learn and implement problem-solving and decision-making skills. Target Date: 2023-10-27 Frequency: Biweekly  Progress: 30 Modality: individual  Related Interventions Conduct Problem-Solving Therapy using techniques such as psychoeducation, modeling, and role-playing to teach client problem-solving skills (i.e., defining a problem specifically, generating possible solutions, evaluating the pros and cons of each solution, selecting and implementing a plan of action, evaluating the efficacy of the plan, accepting or revising the plan); role-play application of the problem-solving skill to a real life issue. Encourage in the client the development of a positive problem orientation in which problems and solving them are viewed as a natural part of life and not something to be feared, despaired, or avoided. 3. Develop healthy interpersonal relationships that lead to the alleviation and help prevent the relapse of depression. 4. Develop healthy thinking patterns and beliefs about self, others, and the world that lead to the alleviation and help prevent the relapse of depression. 5. Recognize, accept, and cope with feelings of depression. Diagnosis F33.1  Conditions For Discharge Achievement of treatment goals and objectives   Salomon Fick, LCSW

## 2022-12-27 DIAGNOSIS — M5416 Radiculopathy, lumbar region: Secondary | ICD-10-CM | POA: Diagnosis not present

## 2022-12-27 DIAGNOSIS — M545 Low back pain, unspecified: Secondary | ICD-10-CM | POA: Diagnosis not present

## 2022-12-30 ENCOUNTER — Ambulatory Visit (HOSPITAL_COMMUNITY)
Admission: RE | Admit: 2022-12-30 | Discharge: 2022-12-30 | Disposition: A | Discharge status: Home / Self Care | Payer: Medicare Other | Source: Ambulatory Visit | Attending: Physician Assistant | Admitting: Physician Assistant

## 2022-12-30 ENCOUNTER — Inpatient Hospital Stay: Payer: Medicare Other | Attending: Internal Medicine

## 2022-12-30 DIAGNOSIS — C37 Malignant neoplasm of thymus: Secondary | ICD-10-CM | POA: Insufficient documentation

## 2022-12-30 DIAGNOSIS — C771 Secondary and unspecified malignant neoplasm of intrathoracic lymph nodes: Secondary | ICD-10-CM | POA: Diagnosis not present

## 2022-12-30 DIAGNOSIS — J479 Bronchiectasis, uncomplicated: Secondary | ICD-10-CM | POA: Diagnosis not present

## 2022-12-30 DIAGNOSIS — J439 Emphysema, unspecified: Secondary | ICD-10-CM | POA: Diagnosis not present

## 2022-12-30 LAB — CBC WITH DIFFERENTIAL (CANCER CENTER ONLY)
Abs Immature Granulocytes: 0.11 10*3/uL — ABNORMAL HIGH (ref 0.00–0.07)
Basophils Absolute: 0 10*3/uL (ref 0.0–0.1)
Basophils Relative: 1 %
Eosinophils Absolute: 0.2 10*3/uL (ref 0.0–0.5)
Eosinophils Relative: 3 %
HCT: 36.5 % (ref 36.0–46.0)
Hemoglobin: 11.9 g/dL — ABNORMAL LOW (ref 12.0–15.0)
Immature Granulocytes: 2 %
Lymphocytes Relative: 16 %
Lymphs Abs: 1.1 10*3/uL (ref 0.7–4.0)
MCH: 29.7 pg (ref 26.0–34.0)
MCHC: 32.6 g/dL (ref 30.0–36.0)
MCV: 91 fL (ref 80.0–100.0)
Monocytes Absolute: 0.5 10*3/uL (ref 0.1–1.0)
Monocytes Relative: 8 %
Neutro Abs: 5.2 10*3/uL (ref 1.7–7.7)
Neutrophils Relative %: 70 %
Platelet Count: 187 10*3/uL (ref 150–400)
RBC: 4.01 MIL/uL (ref 3.87–5.11)
RDW: 14.5 % (ref 11.5–15.5)
WBC Count: 7.2 10*3/uL (ref 4.0–10.5)
nRBC: 0 % (ref 0.0–0.2)

## 2022-12-30 LAB — CMP (CANCER CENTER ONLY)
ALT: 21 U/L (ref 0–44)
AST: 26 U/L (ref 15–41)
Albumin: 3.9 g/dL (ref 3.5–5.0)
Alkaline Phosphatase: 76 U/L (ref 38–126)
Anion gap: 5 (ref 5–15)
BUN: 30 mg/dL — ABNORMAL HIGH (ref 8–23)
CO2: 31 mmol/L (ref 22–32)
Calcium: 10.3 mg/dL (ref 8.9–10.3)
Chloride: 105 mmol/L (ref 98–111)
Creatinine: 1.81 mg/dL — ABNORMAL HIGH (ref 0.44–1.00)
GFR, Estimated: 28 mL/min — ABNORMAL LOW (ref >60)
Glucose, Bld: 86 mg/dL (ref 70–99)
Potassium: 4.9 mmol/L (ref 3.5–5.1)
Sodium: 141 mmol/L (ref 135–145)
Total Bilirubin: 0.4 mg/dL (ref 0.3–1.2)
Total Protein: 6.8 g/dL (ref 6.5–8.1)

## 2023-01-02 ENCOUNTER — Ambulatory Visit (INDEPENDENT_AMBULATORY_CARE_PROVIDER_SITE_OTHER): Payer: Medicare Other

## 2023-01-02 VITALS — Wt 136.0 lb

## 2023-01-02 DIAGNOSIS — Z0000 Encounter for general adult medical examination without abnormal findings: Secondary | ICD-10-CM | POA: Diagnosis not present

## 2023-01-02 NOTE — Progress Notes (Addendum)
I connected with  Sandra Craig on 01/02/23 by a audio enabled telemedicine application and verified that I am speaking with the correct person using two identifiers.  Patient Location: Home  Provider Location: Office/Clinic  I discussed the limitations of evaluation and management by telemedicine. The patient expressed understanding and agreed to proceed.   Subjective:   Sandra Craig is a 82 y.o. female who presents for Medicare Annual (Subsequent) preventive examination.  Review of Systems     Cardiac Risk Factors include: advanced age (>46men, >44 women);hypertension;dyslipidemia     Objective:    Today's Vitals   01/02/23 1528  Weight: 136 lb (61.7 kg)   Body mass index is 23.34 kg/m.     01/02/2023    3:34 PM 08/18/2022    2:43 PM 08/03/2022    9:18 AM 07/03/2022   12:56 PM 12/21/2021   10:38 AM 01/03/2021   10:02 AM 02/26/2020    1:07 PM  Advanced Directives  Does Patient Have a Medical Advance Directive? Yes Yes Yes No Yes Yes No  Type of Estate agent of [place];Living will Living will Healthcare Power of [place];Living will  Healthcare Power of Attorney Living will   Does patient want to make changes to medical advance directive?      No - Patient declined   Copy of Healthcare Power of Attorney in Chart? No - copy requested No - copy requested No - copy requested  No - copy requested    Would patient like information on creating a medical advance directive?      No - Patient declined No - Guardian declined    Current Medications (verified) Outpatient Encounter Medications as of 01/02/2023  Medication Sig   aspirin 81 MG tablet Take 1 tablet (81 mg total) by mouth daily.   atorvastatin (LIPITOR) 40 MG tablet Take 1 tablet (40 mg total) by mouth daily.   Calcium Citrate-Vitamin D (CALCIUM + D PO) Take 1 tablet by mouth 2 (two) times daily.   DULoxetine (CYMBALTA) 30 MG capsule TAKE ONE CAPSULE BY MOUTH TWICE DAILY   famotidine (PEPCID) 20 MG  tablet TAKE 1 TABLET(20 MG) BY MOUTH TWICE DAILY   gabapentin (NEURONTIN) 300 MG capsule TAKE 1 CAPSULE(300 MG) BY MOUTH TWICE DAILY AS NEEDED FOR PAIN (Patient taking differently: Take 300 mg by mouth 2 (two) times daily.)   losartan (COZAAR) 25 MG tablet Take 12.5 mg by mouth daily.   Multiple Vitamins-Minerals (MULTIVITAMIN WITH MINERALS) tablet Take 1 tablet by mouth daily.   Polyethyl Glycol-Propyl Glycol (SYSTANE) 0.4-0.3 % GEL ophthalmic gel Place 1 application into both eyes 3 (three) times daily as needed (dry eye).   sucralfate (CARAFATE) 1 g tablet Take 1 tablet (1 g total) by mouth 4 (four) times daily. Dissolve each tablet in 15 cc water before use.   zolpidem (AMBIEN) 5 MG tablet TAKE 1 TABLET BY MOUTH EVERY DAY AT BEDTIME FOR SLEEP   No facility-administered encounter medications on file as of 01/02/2023.    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   Chronic insomnia    Chronic kidney disease    Cystitis    Depression    Diverticulitis of intestine without perforation or abscess without bleeding    Patient did have abscess but noperforation   Diverticulosis of colon (without mention of hemorrhage)    Endometriosis  Family history of malignant neoplasm of gastrointestinal tract    Fibromyalgia    Gastritis    Hiatal hernia    HIATAL HERNIA 10/08/2008   Qualifier: Diagnosis of  By: Koleen Distance CMA (AAMA), Hulan Saas     History of gallstones    HSV-1 infection    Hyperlipidemia    Hypertension    IBS (irritable bowel syndrome)    IC (interstitial cystitis)    Internal hemorrhoid    Mycobacterium avium complex (HCC)    history- took antibiotics and completed course   Osteonecrosis (HCC)    Osteopenia    Osteoporosis    Palpitations    Polycystic kidney disease    pt denies   Pulmonary nodule 07/2007   5 mm Anterior RUL   Small  bowel obstruction (HCC)    Stroke (HCC) 2021   TIA   Past Surgical History:  Procedure Laterality Date   ABDOMINAL HYSTERECTOMY  1994   TAH,BSO FOR ENDOMETRIOSIS   ABDOMINAL SURGERY  2011   small intestine blockage   CHOLECYSTECTOMY     GASTROPLASTY  2011   small bowel resection -open   INGUINAL HERNIA REPAIR Right 02/28/2020   Procedure: LAPAROSCOPIC RIGHT INGUINAL HERNIA REPAIR WITH MESH;  Surgeon: Axel Filler, MD;  Location[facility];  Service: General;  Laterality: Right;   JOINT REPLACEMENT  2008   OOPHORECTOMY  1994   TAH,BSO   PELVIC LAPAROSCOPY     S/P right shoulder rotater cuff  200216/2011   Tear/adhesive capsulitis   SBO Lap  11   Adhesions and small internal hernia   SHOULDER HEMI-ARTHROPLASTY  06/05/2012   Procedure: SHOULDER HEMI-ARTHROPLASTY;  Surgeon: Eulas Post, MD;  Location: [facility] OR;  Service: Orthopedics;  Laterality: Right;  FOR ARTHRITIS   TOTAL HIP ARTHROPLASTY  FALL OF 2008   rt. partial hip replacement   TOTAL SHOULDER ARTHROPLASTY  06/05/2012   Procedure: TOTAL SHOULDER ARTHROPLASTY;  Surgeon: Eulas Post, MD;  Location: [facility] OR;  Service: Orthopedics;  Laterality: Right;  RIGHT SHOULDER TOTAL ARTHROPLASTY, HEMIARTHROPLASTY, SHOULDER, FOR ARTHRITIS   VIDEO BRONCHOSCOPY Bilateral 01/28/2015   Procedure: VIDEO BRONCHOSCOPY WITH FLUORO;  Surgeon: Leslye Peer, MD;  Location: [facility] ENDOSCOPY;  Service: Cardiopulmonary;  Laterality: Bilateral;   VIDEO BRONCHOSCOPY Bilateral 04/23/2019   Procedure: VIDEO BRONCHOSCOPY WITHOUT FLUORO;  Surgeon: Leslye Peer, MD;  Location: [facility] ENDOSCOPY;  Service: Cardiopulmonary;  Laterality: Bilateral;   Family History  Problem Relation Age of Onset   Heart disease Mother        MI at age 91   Emphysema Mother    Thyroid disease Mother        Thyroidectomy/Benign   COPD Father    Pancreatic cancer Sister        died 68   Cancer Brother        adenocarcinoma right lung   COPD Brother    Lymphoma Maternal  Grandmother    Colon cancer Paternal Grandmother    Heart attack Paternal Grandfather    Social History   Socioeconomic History   Marital status: Married    Spouse name: Not on file   Number of children: 0   Years of education: Not on file   Highest education level: Not on file  Occupational History   Occupation: Retired    Comment: Actuary  Tobacco Use   Smoking status: Former    Packs/day: 0.75    Years: 8.00    Additional pack years: 0.00  Total pack years: 6.00    Types: Cigarettes    Quit date: 08/01/1976    Years since quitting: 46.4   Smokeless tobacco: Never   Tobacco comments:    Quit in 1978  Vaping Use   Vaping Use: Never used  Substance and Sexual Activity   Alcohol use: No    Alcohol/week: 0.0 standard drinks of alcohol   Drug use: No   Sexual activity: Not Currently    Partners: Male    Birth control/protection: Surgical    Comment: hysterectomy, older than 16, less than 5  Other Topics Concern   Not on file  Social History Narrative   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 Married to Long term boyfriend 2 years prior 2021. Prior Widowed.       Enjoys reading    Social Determinants of Health   Financial Resource Strain: Low Risk  (01/02/2023)   Overall Financial Resource Strain (CARDIA)    Difficulty of Paying Living Expenses: Not hard at all  Food Insecurity: No Food Insecurity (01/02/2023)   Hunger Vital Sign    Worried About Running Out of Food in the Last Year: Never true    Ran Out of Food in the Last Year: Never true  Transportation Needs: No Transportation Needs (01/02/2023)   PRAPARE - Administrator, Civil Service (Medical): No    Lack of Transportation (Non-Medical): No  Physical Activity: Inactive (01/02/2023)   Exercise Vital Sign    Days of Exercise per Week: 0 days    Minutes of Exercise per Session: 0 min  Stress: No Stress Concern Present (01/02/2023)   [employer] of  Occupational Health - Occupational Stress Questionnaire    Feeling of Stress : Not at all  Social Connections: Moderately Integrated (01/02/2023)   Social Connection and Isolation Panel [NHANES]    Frequency of Communication with Friends and Family: More than three times a week    Frequency of Social Gatherings with Friends and Family: More than three times a week    Attends Religious Services: Never    Database administrator or Organizations: Yes    Attends Engineer, structural: 1 to 4 times per year    Marital Status: Married    Tobacco Counseling Counseling given: Not Answered Tobacco comments: Quit in 1978   Clinical Intake:  Pre-visit preparation completed: Yes  Pain : No/denies pain     BMI - recorded: 23.34 Nutritional Status: BMI of 19-24  Normal Nutritional Risks: None Diabetes: No  How often do you need to have someone help you when you read instructions, pamphlets, or other written materials from your doctor or pharmacy?: 1 - Never  Diabetic?no  Interpreter Needed?: No  Information entered by :: Lanier Ensign, LPN   Activities of Daily Living    01/02/2023    3:35 PM 08/03/2022    9:20 AM  In your present state of health, do you have any difficulty performing the following activities:  Hearing? 1   Comment hearing aids   Vision? 0   Difficulty concentrating or making decisions? 0   Walking or climbing stairs? 0   Dressing or bathing? 0   Doing errands, shopping? 0 0  Preparing Food and eating ? N   Using the Toilet? N   In the past six months, have you accidently leaked urine? N   Do you have problems with loss of bowel control? N   Managing your Medications? N  Managing your Finances? N   Housekeeping or managing your Housekeeping? N     Patient Care Team: Shelva Majestic, MD as PCP - General (Family Medicine) Delton Coombes Les Pou, MD as Consulting Physician (Pulmonary Disease) Harrington Challenger, NP (Inactive) as Nurse Practitioner  (Obstetrics and Gynecology) Romero Belling, MD as Referring Physician (Physical Medicine and Rehabilitation) Surgery, [facility] as Surgeon (General Surgery) Erroll Luna, [facility] as Pharmacist (Pharmacist)  Indicate any recent Medical Services you may have received from other than [facility] providers in the past year (date may be approximate).     Assessment:   This is a routine wellness examination for Jole.  Hearing/Vision screen Hearing Screening - Comments:: Pt wears hearing aids  Vision Screening - Comments:: Pt follows up with Dr Clydene Pugh for annual eye exams   Dietary issues and exercise activities discussed: Current Exercise Habits: The patient does not participate in regular exercise at present   Goals Addressed             This Visit's Progress    Patient Stated       None at this time        Depression Screen    01/02/2023    3:35 PM 11/30/2022    8:52 AM 06/22/2022    9:59 AM 03/22/2022   11:01 AM 12/21/2021   10:37 AM 11/02/2021   11:28 AM 06/04/2021    1:03 PM  PHQ 2/9 Scores  PHQ - 2 Score 0 0 3 0 0 0 0  PHQ- 9 Score 0 4 4 1  3      Fall Risk    01/02/2023    3:34 PM 11/30/2022    8:51 AM 08/15/2022    3:58 PM 03/22/2022   11:02 AM 12/21/2021   10:39 AM  Fall Risk   Falls in the past year? 1 1 0 1 1  Number falls in past yr:  1 0 1 1  Injury with Fall? 1 1 0 1 0  Comment cracked ribs      Risk for fall due to : Impaired vision;History of fall(s) History of fall(s);No Fall Risks No Fall Risks History of fall(s)   Follow up Falls prevention discussed Falls evaluation completed Falls evaluation completed Falls evaluation completed Falls prevention discussed    FALL RISK PREVENTION PERTAINING TO THE HOME:  Any stairs in or around the home? Yes  If so, are there any without handrails? No  Home free of loose throw rugs in walkways, pet beds, electrical cords, etc? Yes  Adequate lighting in your home to reduce risk of falls? Yes   ASSISTIVE DEVICES  UTILIZED TO PREVENT FALLS:  Life alert? Yes  Use of a cane, walker or w/c? No  Grab bars in the bathroom? Yes  Shower chair or bench in shower? Yes  Elevated toilet seat or a handicapped toilet? Yes   TIMED UP AND GO:  Was the test performed? No .   Cognitive Function: declined     07/26/2018    1:19 PM  MMSE - Mini Mental State Exam  Orientation to time 5  Orientation to Place 5  Registration 3  Attention/ Calculation 5  Recall 3  Language- name 2 objects 2  Language- repeat 1  Language- follow 3 step command 3  Language- read & follow direction 1  Write a sentence 1  Copy design 1  Total score 30        12/21/2021   10:41 AM  6CIT Screen  What Year? 0 points  What month? 0 points  What time? 0 points  Count back from 20 0 points  Months in reverse 0 points  Repeat phrase 0 points  Total Score 0 points    Immunizations Immunization History  Administered Date(s) Administered   Fluad Quad(high Dose 65+) 04/02/2019, 04/05/2020, 05/06/2021   Influenza Split 05/23/2012   Influenza Whole 05/31/2006, 05/28/2007, 05/14/2008, 04/22/2009, 05/01/2010, 04/12/2011   Influenza, High Dose Seasonal PF 05/19/2013, 03/26/2014, 04/21/2016, 04/21/2017, 04/21/2018, 04/02/2019, 05/06/2022   Influenza,inj,Quad PF,6+ Mos 04/15/2015   Influenza-Unspecified 04/29/2018   Moderna Sars-Covid-2 Vaccination 04/14/2021   PFIZER(Purple Top)SARS-COV-2 Vaccination 09/07/2019, 09/27/2019, 05/27/2020   Pneumococcal Conjugate-13 02/03/2015   Pneumococcal Polysaccharide-23 05/31/2006, 06/07/2012   Td 12/21/2009   Zoster Recombinat (Shingrix) 05/26/2022   Zoster, Live 02/06/2006    TDAP status: Due, Education has been provided regarding the importance of this vaccine. Advised may receive this vaccine at local pharmacy or Health Dept. Aware to provide a copy of the vaccination record if obtained from local pharmacy or Health Dept. Verbalized acceptance and understanding.  Flu Vaccine status:  Up to date  Pneumococcal vaccine status: Up to date  Covid-19 vaccine status: Completed vaccines  Qualifies for Shingles Vaccine? Yes   Zostavax completed Yes   Shingrix Completed?: No.    Education has been provided regarding the importance of this vaccine. Patient has been advised to call insurance company to determine out of pocket expense if they have not yet received this vaccine. Advised may also receive vaccine at local pharmacy or Health Dept. Verbalized acceptance and understanding.  Screening Tests Health Maintenance  Topic Date Due   DTaP/Tdap/Td (2 - Tdap) 12/22/2019   COVID-19 Vaccine (5 - 2023-24 season) 04/01/2022   Zoster Vaccines- Shingrix (2 of 2) 07/21/2022   INFLUENZA VACCINE  03/02/2023   MAMMOGRAM  05/10/2023   Medicare Annual Wellness (AWV)  01/02/2024   Pneumonia Vaccine 76+ Years old  Completed   DEXA SCAN  Completed   HPV VACCINES  Aged Out   Colonoscopy  Discontinued   Hepatitis C Screening  Discontinued    Health Maintenance  Health Maintenance Due  Topic Date Due   DTaP/Tdap/Td (2 - Tdap) 12/22/2019   COVID-19 Vaccine (5 - 2023-24 season) 04/01/2022   Zoster Vaccines- Shingrix (2 of 2) 07/21/2022    Colorectal cancer screening: Type of screening: Colonoscopy. Completed 09/12/19. Repeat every as directed  years  Mammogram status: Completed 05/09/22. Repeat every year  Bone Density status: Completed 03/23/21. Results reflect: Bone density results: OSTEOPENIA. Repeat every 2 years.   Additional Screening:   Vision Screening: Recommended annual ophthalmology exams for early detection of glaucoma and other disorders of the eye. Is the patient up to date with their annual eye exam?  Yes  Who is the provider or what is the name of the office in which the patient attends annual eye exams? Dr Harriette Bouillon  If pt is not established with a provider, would they like to be referred to a provider to establish care? No .   Dental Screening: Recommended annual  dental exams for proper oral hygiene  Community Resource Referral / Chronic Care Management: CRR required this visit?  No   CCM required this visit?  No      Plan:     I have personally reviewed and noted the following in the patient's chart:   Medical and social history Use of alcohol, tobacco or illicit drugs  Current medications and supplements including opioid prescriptions. Patient is not  currently taking opioid prescriptions. Functional ability and status Nutritional status Physical activity Advanced directives List of other physicians Hospitalizations, surgeries, and ER visits in previous 12 months Vitals Screenings to include cognitive, depression, and falls Referrals and appointments  In addition, I have reviewed and discussed with patient certain preventive protocols, quality metrics, and best practice recommendations. A written personalized care plan for preventive services as well as general preventive health recommendations were provided to patient.     Marzella Schlein, LPN   08/05/7827   Nurse Notes: Pt declined stating she'd rather not at this time, pt was knowledgeable to answer questions asked

## 2023-01-02 NOTE — Patient Instructions (Signed)
Sandra Craig , Thank you for taking time to come for your Medicare Wellness Visit. I appreciate your ongoing commitment to your health goals. Please review the following plan we discussed and let me know if I can assist you in the future.   These are the goals we discussed:  Goals       Patient Stated (pt-stated)      Patient will join [employer] after the first of the 2020 year. She will work out at [employer].      Patient Stated      Live longer       Track and Manage My Blood Pressure-Hypertension      Timeframe:  Long-Range Goal Priority:  High Start Date:  10/12/21                           Expected End Date: 04/14/22                      Follow Up Date 01/12/22    - check blood pressure daily - choose a place to take my blood pressure (home, clinic or office, retail store) - write blood pressure results in a log or diary    Why is this important?   You won't feel high blood pressure, but it can still hurt your blood vessels.  High blood pressure can cause heart or kidney problems. It can also cause a stroke.  Making lifestyle changes like losing a little weight or eating less salt will help.  Checking your blood pressure at home and at different times of the day can help to control blood pressure.  If the doctor prescribes medicine remember to take it the way the doctor ordered.  Call the office if you cannot afford the medicine or if there are questions about it.     Notes:         This is a list of the screening recommended for you and due dates:  Health Maintenance  Topic Date Due   DTaP/Tdap/Td vaccine (2 - Tdap) 12/22/2019   COVID-19 Vaccine (5 - 2023-24 season) 04/01/2022   Zoster (Shingles) Vaccine (2 of 2) 07/21/2022   Flu Shot  03/02/2023   Mammogram  05/10/2023   Medicare Annual Wellness Visit  01/02/2024   Pneumonia Vaccine  Completed   DEXA scan (bone density measurement)  Completed   HPV Vaccine  Aged Out   Colon Cancer Screening  Discontinued    Hepatitis C Screening  Discontinued    Advanced directives: Please bring a copy of your health care power of attorney and living will to the office at your convenience.  Conditions/risks identified: none at this time   Next appointment: Follow up in one year for your annual wellness visit    Preventive Care 65 Years and Older, Female Preventive care refers to lifestyle choices and visits with your health care provider that can promote health and wellness. What does preventive care include? A yearly physical exam. This is also called an annual well check. Dental exams once or twice a year. Routine eye exams. Ask your health care provider how often you should have your eyes checked. Personal lifestyle choices, including: Daily care of your teeth and gums. Regular physical activity. Eating a healthy diet. Avoiding tobacco and drug use. Limiting alcohol use. Practicing safe sex. Taking low-dose aspirin every day. Taking vitamin and mineral supplements as recommended by your health care provider. What happens  during an annual well check? The services and screenings done by your health care provider during your annual well check will depend on your age, overall health, lifestyle risk factors, and family history of disease. Counseling  Your health care provider may ask you questions about your: Alcohol use. Tobacco use. Drug use. Emotional well-being. Home and relationship well-being. Sexual activity. Eating habits. History of falls. Memory and ability to understand (cognition). Work and work Astron[employer]. Reproductive health. Screening  You may have the following tests or measurements: Height, weight, and BMI. Blood pressure. Lipid and cholesterol levels. These may be checked every 5 years, or more frequently if you are over 70 years old. Skin check. Lung cancer screening. You may have this screening every year starting at age 49 if you have a 30-pack-year history of smoking  and currently smoke or have quit within the past 15 years. Fecal occult blood test (FOBT) of the stool. You may have this test every year starting at age 72. Flexible sigmoidoscopy or colonoscopy. You may have a sigmoidoscopy every 5 years or a colonoscopy every 10 years starting at age 53. Hepatitis C blood test. Hepatitis B blood test. Sexually transmitted disease (STD) testing. Diabetes screening. This is done by checking your blood sugar (glucose) after you have not eaten for a while (fasting). You may have this done every 1-3 years. Bone density scan. This is done to screen for osteoporosis. You may have this done starting at age 66. Mammogram. This may be done every 1-2 years. Talk to your health care provider about how often you should have regular mammograms. Talk with your health care provider about your test results, treatment options, and if necessary, the need for more tests. Vaccines  Your health care provider may recommend certain vaccines, such as: Influenza vaccine. This is recommended every year. Tetanus, diphtheria, and acellular pertussis (Tdap, Td) vaccine. You may need a Td booster every 10 years. Zoster vaccine. You may need this after age 49. Pneumococcal 13-valent conjugate (PCV13) vaccine. One dose is recommended after age 59. Pneumococcal polysaccharide (PPSV23) vaccine. One dose is recommended after age 44. Talk to your health care provider about which screenings and vaccines you need and how often you need them. This information is not intended to replace advice given to you by your health care provider. Make sure you discuss any questions you have with your health care provider. Document Released: 08/14/2015 Document Revised: 04/06/2016 Document Reviewed: 05/19/2015 Elsevier Interactive Patient Education  2017 [employer].  Fall Prevention in the Home Falls can cause injuries. They can happen to people of all ages. There are many things you can do to make your  home safe and to help prevent falls. What can I do on the outside of my home? Regularly fix the edges of walkways and driveways and fix any cracks. Remove anything that might make you trip as you walk through a door, such as a raised step or threshold. Trim any bushes or trees on the path to your home. Use bright outdoor lighting. Clear any walking paths of anything that might make someone trip, such as rocks or tools. Regularly check to see if handrails are loose or broken. Make sure that both sides of any steps have handrails. Any raised decks and porches should have guardrails on the edges. Have any leaves, snow, or ice cleared regularly. Use sand or salt on walking paths during winter. Clean up any spills in your garage right away. This includes oil or grease spills. What  can I do in the bathroom? Use night lights. Install grab bars by the toilet and in the tub and shower. Do not use towel bars as grab bars. Use non-skid mats or decals in the tub or shower. If you need to sit down in the shower, use a plastic, non-slip stool. Keep the floor dry. Clean up any water that spills on the floor as soon as it happens. Remove soap buildup in the tub or shower regularly. Attach bath mats securely with double-sided non-slip rug tape. Do not have throw rugs and other things on the floor that can make you trip. What can I do in the bedroom? Use night lights. Make sure that you have a light by your bed that is easy to reach. Do not use any sheets or blankets that are too big for your bed. They should not hang down onto the floor. Have a firm chair that has side arms. You can use this for support while you get dressed. Do not have throw rugs and other things on the floor that can make you trip. What can I do in the kitchen? Clean up any spills right away. Avoid walking on wet floors. Keep items that you use a lot in easy-to-reach places. If you need to reach something above you, use a strong  step stool that has a grab bar. Keep electrical cords out of the way. Do not use floor polish or wax that makes floors slippery. If you must use wax, use non-skid floor wax. Do not have throw rugs and other things on the floor that can make you trip. What can I do with my stairs? Do not leave any items on the stairs. Make sure that there are handrails on both sides of the stairs and use them. Fix handrails that are broken or loose. Make sure that handrails are as long as the stairways. Check any carpeting to make sure that it is firmly attached to the stairs. Fix any carpet that is loose or worn. Avoid having throw rugs at the top or bottom of the stairs. If you do have throw rugs, attach them to the floor with carpet tape. Make sure that you have a light switch at the top of the stairs and the bottom of the stairs. If you do not have them, ask someone to add them for you. What else can I do to help prevent falls? Wear shoes that: Do not have high heels. Have rubber bottoms. Are comfortable and fit you well. Are closed at the toe. Do not wear sandals. If you use a stepladder: Make sure that it is fully opened. Do not climb a closed stepladder. Make sure that both sides of the stepladder are locked into place. Ask someone to hold it for you, if possible. Clearly mark and make sure that you can see: Any grab bars or handrails. First and last steps. Where the edge of each step is. Use tools that help you move around (mobility aids) if they are needed. These include: Canes. Walkers. Scooters. Crutches. Turn on the lights when you go into a dark area. Replace any light bulbs as soon as they burn out. Set up your furniture so you have a clear path. Avoid moving your furniture around. If any of your floors are uneven, fix them. If there are any pets around you, be aware of where they are. Review your medicines with your doctor. Some medicines can make you feel dizzy. This can increase your  chance of  falling. Ask your doctor what other things that you can do to help prevent falls. This information is not intended to replace advice given to you by your health care provider. Make sure you discuss any questions you have with your health care provider. Document Released: 05/14/2009 Document Revised: 12/24/2015 Document Reviewed: 08/22/2014 Elsevier Interactive Patient Education  2017 [employer].

## 2023-01-03 ENCOUNTER — Inpatient Hospital Stay: Payer: Medicare Other | Attending: Internal Medicine | Admitting: Internal Medicine

## 2023-01-03 VITALS — BP 103/62 | HR 111 | Temp 98.5°F | Resp 15 | Ht 64.0 in | Wt 139.7 lb

## 2023-01-03 DIAGNOSIS — C801 Malignant (primary) neoplasm, unspecified: Secondary | ICD-10-CM | POA: Insufficient documentation

## 2023-01-03 DIAGNOSIS — C771 Secondary and unspecified malignant neoplasm of intrathoracic lymph nodes: Secondary | ICD-10-CM | POA: Insufficient documentation

## 2023-01-03 DIAGNOSIS — C37 Malignant neoplasm of thymus: Secondary | ICD-10-CM

## 2023-01-03 NOTE — Progress Notes (Signed)
[facility] Telephone:([phone]   Fax:([phone]  OFFICE PROGRESS NOTE  Shelva Majestic, MD 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    PRIOR THERAPY: status post surgical resection of the anterior mediastinal lymph node under the care of Dr. Dorris Fetch on 08/05/22   CURRENT THERAPY: Adjuvant radiation under the care of Dr. Mitzi Hansen.   INTERVAL HISTORY: Sandra Craig 82 y.o. female returns to the clinic today for follow-up visit.  The patient is feeling fine today with no concerning complaints.  The patient has some shingles symptoms on the left side of the chest.  She has not seen her primary care physician yet.  She denied having any shortness of breath, cough or hemoptysis.  She has no nausea, vomiting, diarrhea or constipation.  She has no headache or visual changes.  She completed a course of palliative radiotherapy to the anterior mediastinal lymph nodes after the surgical resection.  She is here today for evaluation with repeat CT scan of the chest for restaging of her disease.  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   Chronic insomnia    Chronic kidney disease    Cystitis    Depression    Diverticulitis of intestine without perforation or abscess without bleeding    Patient did have abscess but noperforation   Diverticulosis of colon (without mention of hemorrhage)    Endometriosis    Family history of malignant neoplasm of gastrointestinal tract    Fibromyalgia    Gastritis    Hiatal hernia    HIATAL HERNIA 10/08/2008   Qualifier: Diagnosis of  By: Koleen Distance CMA Duncan Dull), Hulan Saas     History of gallstones     HSV-1 infection    Hyperlipidemia    Hypertension    IBS (irritable bowel syndrome)    IC (interstitial cystitis)    Internal hemorrhoid    Mycobacterium avium complex (HCC)    history- took antibiotics and completed course   Osteonecrosis (HCC)    Osteopenia    Osteoporosis    Palpitations    Polycystic kidney disease    pt denies   Pulmonary nodule 07/2007   5 mm Anterior RUL   Small bowel obstruction (HCC)    Stroke (HCC) 2021   TIA    ALLERGIES:  is allergic to azithromycin and celecoxib.  MEDICATIONS:  Current Outpatient Medications  Medication Sig Dispense Refill   aspirin 81 MG tablet Take 1 tablet (81 mg total) by mouth daily. 30 tablet    atorvastatin (LIPITOR) 40 MG tablet Take 1 tablet (40 mg total) by mouth daily. 90 tablet 3   Calcium Citrate-Vitamin D (CALCIUM + D PO) Take 1 tablet by mouth 2 (two) times daily.     DULoxetine (CYMBALTA) 30 MG capsule TAKE ONE CAPSULE BY MOUTH TWICE DAILY 180 capsule 3   famotidine (PEPCID) 20 MG tablet TAKE 1 TABLET(20 MG) BY MOUTH TWICE DAILY 180 tablet 1   gabapentin (NEURONTIN) 300 MG capsule TAKE 1 CAPSULE(300 MG) BY MOUTH TWICE DAILY AS NEEDED FOR PAIN (Patient taking differently: Take 300 mg by mouth 2 (two) times daily.) 180 capsule 3   losartan (COZAAR) 25  MG tablet Take 12.5 mg by mouth daily.     Multiple Vitamins-Minerals (MULTIVITAMIN WITH MINERALS) tablet Take 1 tablet by mouth daily.     Polyethyl Glycol-Propyl Glycol (SYSTANE) 0.4-0.3 % GEL ophthalmic gel Place 1 application into both eyes 3 (three) times daily as needed (dry eye).     sucralfate (CARAFATE) 1 g tablet Take 1 tablet (1 g total) by mouth 4 (four) times daily. Dissolve each tablet in 15 cc water before use. 120 tablet 2   zolpidem (AMBIEN) 5 MG tablet TAKE 1 TABLET BY MOUTH EVERY DAY AT BEDTIME FOR SLEEP 90 tablet 1   No current facility-administered medications for this visit.    SURGICAL HISTORY:  Past Surgical History:  Procedure Laterality Date    ABDOMINAL HYSTERECTOMY  1994   TAH,BSO FOR ENDOMETRIOSIS   ABDOMINAL SURGERY  2011   small intestine blockage   CHOLECYSTECTOMY     GASTROPLASTY  2011   small bowel resection -open   INGUINAL HERNIA REPAIR Right 02/28/2020   Procedure: LAPAROSCOPIC RIGHT INGUINAL HERNIA REPAIR WITH MESH;  Surgeon: Axel Filler, MD;  Location: [facility] OR;  Service: General;  Laterality: Right;   JOINT REPLACEMENT  2008   OOPHORECTOMY  1994   TAH,BSO   PELVIC LAPAROSCOPY     S/P right shoulder rotater cuff  200216/2011   Tear/adhesive capsulitis   SBO Lap  11   Adhesions and small internal hernia   SHOULDER HEMI-ARTHROPLASTY  06/05/2012   Procedure: SHOULDER HEMI-ARTHROPLASTY;  Surgeon: Eulas Post, MD;  Location: [facility] OR;  Service: Orthopedics;  Laterality: Right;  FOR ARTHRITIS   TOTAL HIP ARTHROPLASTY  FALL OF 2008   rt. partial hip replacement   TOTAL SHOULDER ARTHROPLASTY  06/05/2012   Procedure: TOTAL SHOULDER ARTHROPLASTY;  Surgeon: Eulas Post, MD;  Location: [facility] OR;  Service: Orthopedics;  Laterality: Right;  RIGHT SHOULDER TOTAL ARTHROPLASTY, HEMIARTHROPLASTY, SHOULDER, FOR ARTHRITIS   VIDEO BRONCHOSCOPY Bilateral 01/28/2015   Procedure: VIDEO BRONCHOSCOPY WITH FLUORO;  Surgeon: Leslye Peer, MD;  Location[facility] ENDOSCOPY;  Service: Cardiopulmonary;  Laterality: Bilateral;   VIDEO BRONCHOSCOPY Bilateral 04/23/2019   Procedure: VIDEO BRONCHOSCOPY WITHOUT FLUORO;  Surgeon: Leslye Peer, MD;  Location: [facility] ENDOSCOPY;  Service: Cardiopulmonary;  Laterality: Bilateral;    REVIEW OF SYSTEMS:  A comprehensive review of systems was negative except for: Respiratory: positive for pleurisy/chest pain   PHYSICAL EXAMINATION: General appearance: alert, cooperative, and no distress Head: Normocephalic, without obvious abnormality, atraumatic Neck: no adenopathy, no JVD, supple, symmetrical, trachea midline, and thyroid not enlarged, symmetric, no tenderness/mass/nodules Lymph nodes: Cervical,  supraclavicular, and axillary nodes normal. Resp: clear to auscultation bilaterally Back: symmetric, no curvature. ROM normal. No CVA tenderness. Cardio: regular rate and rhythm, S1, S2 normal, no murmur, click, rub or gallop GI: soft, non-tender; bowel sounds normal; no masses,  no organomegaly Extremities: extremities normal, atraumatic, no cyanosis or edema  ECOG PERFORMANCE STATUS: 1 - Symptomatic but completely ambulatory  Blood pressure 103/62, pulse (!) 111, temperature 98.5 F (36.9 C), temperature source Oral, resp. rate 15, height 5\' 4"  (1.626 m), weight 139 lb 11.2 oz (63.4 kg), last menstrual period 08/25/1992, SpO2 96 %.  LABORATORY DATA: Lab Results  Component Value Date   WBC 7.2 12/30/2022   HGB 11.9 (L) 12/30/2022   HCT 36.5 12/30/2022   MCV 91.0 12/30/2022   PLT 187 12/30/2022      Chemistry      Component Value Date/Time   NA 141 12/30/2022 1056   NA  140 02/10/2022 0000   K 4.9 12/30/2022 1056   CL 105 12/30/2022 1056   CO2 31 12/30/2022 1056   BUN 30 (H) 12/30/2022 1056   BUN 24 (A) 02/10/2022 0000   CREATININE 1.81 (H) 12/30/2022 1056   CREATININE 1.61 (H) 07/21/2020 1559   GLU 90 02/10/2022 0000      Component Value Date/Time   CALCIUM 10.3 12/30/2022 1056   CALCIUM 10.3 12/21/2009 2250   ALKPHOS 76 12/30/2022 1056   AST 26 12/30/2022 1056   ALT 21 12/30/2022 1056   BILITOT 0.4 12/30/2022 1056       RADIOGRAPHIC STUDIES: CT Chest Wo Contrast  Result Date: 01/02/2023 CLINICAL DATA:  Thymic cancer follow-up. Status post XRT. Surgery. * Tracking Code: BO * EXAM: CT CHEST WITHOUT CONTRAST TECHNIQUE: Multidetector CT imaging of the chest was performed following the standard protocol without IV contrast. RADIATION DOSE REDUCTION: This exam was performed according to the departmental dose-optimization program which includes automated exposure control, adjustment of the mA and/or kV according to patient size and/or use of iterative reconstruction  technique. COMPARISON:  Chest CT 08/26/2022 FINDINGS: Cardiovascular: On this non IV contrast exam the heart is nonenlarged. No pericardial effusion. Coronary artery calcifications are seen. On this non IV contrast exam the thoracic aorta has a normal course and caliber with some scattered vascular calcifications. Mediastinum/Nodes: No discrete abnormal lymph node enlargement identified in the axillary region or hilum on this noncontrast examination. Small anterior cardiophrenic lymph node on the left side is smaller today. Previously this measured 14 by 7 mm and today on series 2, image 103 10 x 6 mm. Small areas of nodularity in the anterior lower mediastinum are slightly decreased today such as series 2, image 109. The lenticular area fluid like density along the anterior margin of the pericardium on the right side is smaller today. Overall decreasing fluid in the pericardial recesses. Residual area on series 2, image 74 measures thickness of 4 mm. Previously 11 mm in retrospect. Otherwise no new abnormal lymph node enlargement identified in the mediastinum. Lungs/Pleura: Decrease in tiny left pleural effusion with trace residual. Few air cysts are identified in the lungs bilaterally. There continues to be tree-in-bud bilateral lung areas of nodularity with the areas of bronchiectasis and bronchial wall thickening. Areas in the right upper lobe has significantly increased with some consolidative like opacity in the medial right upper lobe on series 6 image 40. Areas in the lingula and medial aspect of the left upper lobe have also increased. Is also some bandlike opacities which have increased in the medial left lower lobe. Differential would include atypical infection. Recommend close follow-up. There are some areas which have improved. The 9 mm nodule in the peripheral right upper lobe on the prior, today has a centrally resolved. Upper Abdomen: Adrenal glands are preserved in the upper abdomen. Previous  cholecystectomy. Musculoskeletal: Streak artifact related to the patient's right shoulder arthroplasty. Healing left-sided rib fractures. Mild degenerative changes along the spine. Slight Schmorl's node deformities as well at multiple levels in the thoracic spine. IMPRESSION: Decreasing mediastinal areas of nodularity in the lymph nodes. Decreasing left effusion with trace residual. Multifocal tree-in-bud like lung nodularity and opacity with bronchiectasis. Several areas have increased today with more confluence areas particularly in the upper lobes and medial left lower lobe. There are some areas which have improved in the right upper lobe compared to the prior CT scan as well. This has a differential including atypical infection recommend continued close follow-up. Partial  interval healing of left-sided previously seen rib fractures. Aortic Atherosclerosis (ICD10-I70.0) and Emphysema (ICD10-J43.9). Electronically Signed   By: Karen Kays M.D.   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  She is status post surgical resection of the anterior mediastinal lymph node under the care of Dr. Dorris Fetch on 08/05/22.  She also underwent adjuvant radiotherapy under the care of Dr. Mitzi Hansen completed in February 2024. She is currently on observation and feeling fine. She had repeat CT scan of the chest performed recently.  I personally and independently reviewed the scan and discussed the result with the patient today. Her scan showed no concerning findings for disease progression but she has some suspicious airspace opacity concerning for atypical infection and she is followed by the pulmonary medicine. I will see her back for follow-up visit in 6 months  for evaluation with repeat CT scan of the chest for restaging of her disease. The patient was advised to call immediately if she has any other concerning symptoms in the interval. The patient voices understanding of current disease status and treatment options and is in agreement with the current care plan.  All questions were answered. The patient knows to call the clinic with any problems, questions or concerns. We can certainly see the patient much sooner if necessary.  The total time spent in the appointment was 20 minutes.  Disclaimer: This note was dictated with voice recognition software. Similar sounding words can inadvertently be transcribed and may not be corrected upon review.

## 2023-01-04 NOTE — Progress Notes (Signed)
I connected with  STEFHANIE AYRES on 01/04/23 by a audio enabled telemedicine application and verified that I am speaking with the correct person using two identifiers.  Patient Location: Home  Provider Location: Office/Clinic  I discussed the limitations of evaluation and management by telemedicine. The patient expressed understanding and agreed to proceed.   Subjective:   Sandra Craig is a 82 y.o. female who presents for Medicare Annual (Subsequent) preventive examination.  Review of Systems     Cardiac Risk Factors include: advanced age (>9men, >4 women);hypertension;dyslipidemia     Objective:    Today's Vitals   01/02/23 1528  Weight: 136 lb (61.7 kg)   Body mass index is 23.34 kg/m.     01/02/2023    3:34 PM 08/18/2022    2:43 PM 08/03/2022    9:18 AM 07/03/2022   12:56 PM 12/21/2021   10:38 AM 01/03/2021   10:02 AM 02/26/2020    1:07 PM  Advanced Directives  Does Patient Have a Medical Advance Directive? Yes Yes Yes No Yes Yes No  Type of Estate agent of [place];Living will Living will Healthcare Power of [place];Living will  Healthcare Power of Attorney Living will   Does patient want to make changes to medical advance directive?      No - Patient declined   Copy of Healthcare Power of Attorney in Chart? No - copy requested No - copy requested No - copy requested  No - copy requested    Would patient like information on creating a medical advance directive?      No - Patient declined No - Guardian declined    Current Medications (verified) Outpatient Encounter Medications as of 01/02/2023  Medication Sig   aspirin 81 MG tablet Take 1 tablet (81 mg total) by mouth daily.   atorvastatin (LIPITOR) 40 MG tablet Take 1 tablet (40 mg total) by mouth daily.   Calcium Citrate-Vitamin D (CALCIUM + D PO) Take 1 tablet by mouth 2 (two) times daily.   DULoxetine (CYMBALTA) 30 MG capsule TAKE ONE CAPSULE BY MOUTH TWICE DAILY   famotidine (PEPCID) 20 MG  tablet TAKE 1 TABLET(20 MG) BY MOUTH TWICE DAILY   gabapentin (NEURONTIN) 300 MG capsule TAKE 1 CAPSULE(300 MG) BY MOUTH TWICE DAILY AS NEEDED FOR PAIN (Patient taking differently: Take 300 mg by mouth 2 (two) times daily.)   losartan (COZAAR) 25 MG tablet Take 12.5 mg by mouth daily.   Multiple Vitamins-Minerals (MULTIVITAMIN WITH MINERALS) tablet Take 1 tablet by mouth daily.   Polyethyl Glycol-Propyl Glycol (SYSTANE) 0.4-0.3 % GEL ophthalmic gel Place 1 application into both eyes 3 (three) times daily as needed (dry eye).   sucralfate (CARAFATE) 1 g tablet Take 1 tablet (1 g total) by mouth 4 (four) times daily. Dissolve each tablet in 15 cc water before use.   zolpidem (AMBIEN) 5 MG tablet TAKE 1 TABLET BY MOUTH EVERY DAY AT BEDTIME FOR SLEEP   No facility-administered encounter medications on file as of 01/02/2023.    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   Chronic insomnia    Chronic kidney disease    Cystitis    Depression    Diverticulitis of intestine without perforation or abscess without bleeding    Patient did have abscess but noperforation   Diverticulosis of colon (without mention of hemorrhage)    Endometriosis  Family history of malignant neoplasm of gastrointestinal tract    Fibromyalgia    Gastritis    Hiatal hernia    HIATAL HERNIA 10/08/2008   Qualifier: Diagnosis of  By: Koleen Distance CMA (AAMA), Hulan Saas     History of gallstones    HSV-1 infection    Hyperlipidemia    Hypertension    IBS (irritable bowel syndrome)    IC (interstitial cystitis)    Internal hemorrhoid    Mycobacterium avium complex (HCC)    history- took antibiotics and completed course   Osteonecrosis (HCC)    Osteopenia    Osteoporosis    Palpitations    Polycystic kidney disease    pt denies   Pulmonary nodule 07/2007   5 mm Anterior RUL   Small  bowel obstruction (HCC)    Stroke (HCC) 2021   TIA   Past Surgical History:  Procedure Laterality Date   ABDOMINAL HYSTERECTOMY  1994   TAH,BSO FOR ENDOMETRIOSIS   ABDOMINAL SURGERY  2011   small intestine blockage   CHOLECYSTECTOMY     GASTROPLASTY  2011   small bowel resection -open   INGUINAL HERNIA REPAIR Right 02/28/2020   Procedure: LAPAROSCOPIC RIGHT INGUINAL HERNIA REPAIR WITH MESH;  Surgeon: Axel Filler, MD;  Location: [facility] OR;  Service: General;  Laterality: Right;   JOINT REPLACEMENT  2008   OOPHORECTOMY  1994   TAH,BSO   PELVIC LAPAROSCOPY     S/P right shoulder rotater cuff  200216/2011   Tear/adhesive capsulitis   SBO Lap  11   Adhesions and small internal hernia   SHOULDER HEMI-ARTHROPLASTY  06/05/2012   Procedure: SHOULDER HEMI-ARTHROPLASTY;  Surgeon: Eulas Post, MD;  Location: [facility] OR;  Service: Orthopedics;  Laterality: Right;  FOR ARTHRITIS   TOTAL HIP ARTHROPLASTY  FALL OF 2008   rt. partial hip replacement   TOTAL SHOULDER ARTHROPLASTY  06/05/2012   Procedure: TOTAL SHOULDER ARTHROPLASTY;  Surgeon: Eulas Post, MD;  Location: [facility] OR;  Service: Orthopedics;  Laterality: Right;  RIGHT SHOULDER TOTAL ARTHROPLASTY, HEMIARTHROPLASTY, SHOULDER, FOR ARTHRITIS   VIDEO BRONCHOSCOPY Bilateral 01/28/2015   Procedure: VIDEO BRONCHOSCOPY WITH FLUORO;  Surgeon: Leslye Peer, MD;  Location[facility] ENDOSCOPY;  Service: Cardiopulmonary;  Laterality: Bilateral;   VIDEO BRONCHOSCOPY Bilateral 04/23/2019   Procedure: VIDEO BRONCHOSCOPY WITHOUT FLUORO;  Surgeon: Leslye Peer, MD;  Location: [facility] ENDOSCOPY;  Service: Cardiopulmonary;  Laterality: Bilateral;   Family History  Problem Relation Age of Onset   Heart disease Mother        MI at age 18   Emphysema Mother    Thyroid disease Mother        Thyroidectomy/Benign   COPD Father    Pancreatic cancer Sister        died 67   Cancer Brother        adenocarcinoma right lung   COPD Brother    Lymphoma Maternal  Grandmother    Colon cancer Paternal Grandmother    Heart attack Paternal Grandfather    Social History   Socioeconomic History   Marital status: Married    Spouse name: Not on file   Number of children: 0   Years of education: Not on file   Highest education level: Not on file  Occupational History   Occupation: Retired    Comment: Actuary  Tobacco Use   Smoking status: Former    Packs/day: 0.75    Years: 8.00    Additional pack years: 0.00  Total pack years: 6.00    Types: Cigarettes    Quit date: 08/01/1976    Years since quitting: 46.4   Smokeless tobacco: Never   Tobacco comments:    Quit in 1978  Vaping Use   Vaping Use: Never used  Substance and Sexual Activity   Alcohol use: No    Alcohol/week: 0.0 standard drinks of alcohol   Drug use: No   Sexual activity: Not Currently    Partners: Male    Birth control/protection: Surgical    Comment: hysterectomy, older than 16, less than 5  Other Topics Concern   Not on file  Social History Narrative   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 Married to Long term boyfriend 2 years prior 2021. Prior Widowed.       Enjoys reading    Social Determinants of Health   Financial Resource Strain: Low Risk  (01/02/2023)   Overall Financial Resource Strain (CARDIA)    Difficulty of Paying Living Expenses: Not hard at all  Food Insecurity: No Food Insecurity (01/02/2023)   Hunger Vital Sign    Worried About Running Out of Food in the Last Year: Never true    Ran Out of Food in the Last Year: Never true  Transportation Needs: No Transportation Needs (01/02/2023)   PRAPARE - Administrator, Civil Service (Medical): No    Lack of Transportation (Non-Medical): No  Physical Activity: Inactive (01/02/2023)   Exercise Vital Sign    Days of Exercise per Week: 0 days    Minutes of Exercise per Session: 0 min  Stress: No Stress Concern Present (01/02/2023)   [employer] of  Occupational Health - Occupational Stress Questionnaire    Feeling of Stress : Not at all  Social Connections: Moderately Integrated (01/02/2023)   Social Connection and Isolation Panel [NHANES]    Frequency of Communication with Friends and Family: More than three times a week    Frequency of Social Gatherings with Friends and Family: More than three times a week    Attends Religious Services: Never    Database administrator or Organizations: Yes    Attends Engineer, structural: 1 to 4 times per year    Marital Status: Married    Tobacco Counseling Counseling given: Not Answered Tobacco comments: Quit in 1978   Clinical Intake:  Pre-visit preparation completed: Yes  Pain : No/denies pain     BMI - recorded: 23.34 Nutritional Status: BMI of 19-24  Normal Nutritional Risks: None Diabetes: No  How often do you need to have someone help you when you read instructions, pamphlets, or other written materials from your doctor or pharmacy?: 1 - Never  Diabetic?no  Interpreter Needed?: No  Information entered by :: Lanier Ensign, LPN   Activities of Daily Living    01/02/2023    3:35 PM 08/03/2022    9:20 AM  In your present state of health, do you have any difficulty performing the following activities:  Hearing? 1   Comment hearing aids   Vision? 0   Difficulty concentrating or making decisions? 0   Walking or climbing stairs? 0   Dressing or bathing? 0   Doing errands, shopping? 0 0  Preparing Food and eating ? N   Using the Toilet? N   In the past six months, have you accidently leaked urine? N   Do you have problems with loss of bowel control? N   Managing your Medications? N  Managing your Finances? N   Housekeeping or managing your Housekeeping? N     Patient Care Team: Shelva Majestic, MD as PCP - General (Family Medicine) Delton Coombes Les Pou, MD as Consulting Physician (Pulmonary Disease) Harrington Challenger, NP (Inactive) as Nurse Practitioner  (Obstetrics and Gynecology) Romero Belling, MD as Referring Physician (Physical Medicine and Rehabilitation) Surgery, [facility] as Surgeon (General Surgery) Erroll Luna, [facility] as Pharmacist (Pharmacist)  Indicate any recent Medical Services you may have received from other than [facility] providers in the past year (date may be approximate).     Assessment:   This is a routine wellness examination for Harrison.  Hearing/Vision screen Hearing Screening - Comments:: Pt wears hearing aids  Vision Screening - Comments:: Pt follows up with Dr Clydene Pugh for annual eye exams   Dietary issues and exercise activities discussed: Current Exercise Habits: The patient does not participate in regular exercise at present   Goals Addressed             This Visit's Progress    Patient Stated       None at this time       Depression Screen    01/02/2023    3:35 PM 11/30/2022    8:52 AM 06/22/2022    9:59 AM 03/22/2022   11:01 AM 12/21/2021   10:37 AM 11/02/2021   11:28 AM 06/04/2021    1:03 PM  PHQ 2/9 Scores  PHQ - 2 Score 0 0 3 0 0 0 0  PHQ- 9 Score 0 4 4 1  3      Fall Risk    01/02/2023    3:34 PM 11/30/2022    8:51 AM 08/15/2022    3:58 PM 03/22/2022   11:02 AM 12/21/2021   10:39 AM  Fall Risk   Falls in the past year? 1 1 0 1 1  Number falls in past yr:  1 0 1 1  Injury with Fall? 1 1 0 1 0  Comment cracked ribs      Risk for fall due to : Impaired vision;History of fall(s) History of fall(s);No Fall Risks No Fall Risks History of fall(s)   Follow up Falls prevention discussed Falls evaluation completed Falls evaluation completed Falls evaluation completed Falls prevention discussed    FALL RISK PREVENTION PERTAINING TO THE HOME:  Any stairs in or around the home? Yes  If so, are there any without handrails? No  Home free of loose throw rugs in walkways, pet beds, electrical cords, etc? Yes  Adequate lighting in your home to reduce risk of falls? Yes   ASSISTIVE DEVICES  UTILIZED TO PREVENT FALLS:  Life alert? Yes  Use of a cane, walker or w/c? No  Grab bars in the bathroom? Yes  Shower chair or bench in shower? Yes  Elevated toilet seat or a handicapped toilet? Yes   TIMED UP AND GO:  Was the test performed? No .   Cognitive Function: declined     07/26/2018    1:19 PM  MMSE - Mini Mental State Exam  Orientation to time 5  Orientation to Place 5  Registration 3  Attention/ Calculation 5  Recall 3  Language- name 2 objects 2  Language- repeat 1  Language- follow 3 step command 3  Language- read & follow direction 1  Write a sentence 1  Copy design 1  Total score 30        12/21/2021   10:41 AM  6CIT Screen  What  Year? 0 points  What month? 0 points  What time? 0 points  Count back from 20 0 points  Months in reverse 0 points  Repeat phrase 0 points  Total Score 0 points    Immunizations Immunization History  Administered Date(s) Administered   Fluad Quad(high Dose 65+) 04/02/2019, 04/05/2020, 05/06/2021   Influenza Split 05/23/2012   Influenza Whole 05/31/2006, 05/28/2007, 05/14/2008, 04/22/2009, 05/01/2010, 04/12/2011   Influenza, High Dose Seasonal PF 05/19/2013, 03/26/2014, 04/21/2016, 04/21/2017, 04/21/2018, 04/02/2019, 05/06/2022   Influenza,inj,Quad PF,6+ Mos 04/15/2015   Influenza-Unspecified 04/29/2018   Moderna Sars-Covid-2 Vaccination 04/14/2021   PFIZER(Purple Top)SARS-COV-2 Vaccination 09/07/2019, 09/27/2019, 05/27/2020   Pneumococcal Conjugate-13 02/03/2015   Pneumococcal Polysaccharide-23 05/31/2006, 06/07/2012   Td 12/21/2009   Zoster Recombinat (Shingrix) 05/26/2022   Zoster, Live 02/06/2006    TDAP status: Due, Education has been provided regarding the importance of this vaccine. Advised may receive this vaccine at local pharmacy or Health Dept. Aware to provide a copy of the vaccination record if obtained from local pharmacy or Health Dept. Verbalized acceptance and understanding.  Flu Vaccine status:  Up to date  Pneumococcal vaccine status: Up to date  Covid-19 vaccine status: Completed vaccines  Qualifies for Shingles Vaccine? Yes   Zostavax completed Yes   Shingrix Completed?: No.    Education has been provided regarding the importance of this vaccine. Patient has been advised to call insurance company to determine out of pocket expense if they have not yet received this vaccine. Advised may also receive vaccine at local pharmacy or Health Dept. Verbalized acceptance and understanding.  Screening Tests Health Maintenance  Topic Date Due   DTaP/Tdap/Td (2 - Tdap) 12/22/2019   COVID-19 Vaccine (5 - 2023-24 season) 04/01/2022   Zoster Vaccines- Shingrix (2 of 2) 07/21/2022   INFLUENZA VACCINE  03/02/2023   MAMMOGRAM  05/10/2023   Medicare Annual Wellness (AWV)  01/02/2024   Pneumonia Vaccine 47+ Years old  Completed   DEXA SCAN  Completed   HPV VACCINES  Aged Out   Colonoscopy  Discontinued   Hepatitis C Screening  Discontinued    Health Maintenance  Health Maintenance Due  Topic Date Due   DTaP/Tdap/Td (2 - Tdap) 12/22/2019   COVID-19 Vaccine (5 - 2023-24 season) 04/01/2022   Zoster Vaccines- Shingrix (2 of 2) 07/21/2022    Colorectal cancer screening: Type of screening: Colonoscopy. Completed 09/12/19. Repeat every as directed  years  Mammogram status: Completed 05/09/22. Repeat every year  Bone Density status: Completed 03/23/21. Results reflect: Bone density results: OSTEOPENIA. Repeat every 2 years.   Additional Screening:   Vision Screening: Recommended annual ophthalmology exams for early detection of glaucoma and other disorders of the eye. Is the patient up to date with their annual eye exam?  Yes  Who is the provider or what is the name of the office in which the patient attends annual eye exams? Dr Harriette Bouillon  If pt is not established with a provider, would they like to be referred to a provider to establish care? No .   Dental Screening: Recommended annual  dental exams for proper oral hygiene  Community Resource Referral / Chronic Care Management: CRR required this visit?  No   CCM required this visit?  No      Plan:     I have personally reviewed and noted the following in the patient's chart:   Medical and social history Use of alcohol, tobacco or illicit drugs  Current medications and supplements including opioid prescriptions. Patient is not currently  taking opioid prescriptions. Functional ability and status Nutritional status Physical activity Advanced directives List of other physicians Hospitalizations, surgeries, and ER visits in previous 12 months Vitals Screenings to include cognitive, depression, and falls Referrals and appointments  In addition, I have reviewed and discussed with patient certain preventive protocols, quality metrics, and best practice recommendations. A written personalized care plan for preventive services as well as general preventive health recommendations were provided to patient.     Marzella Schlein, LPN   08/06/1094   Nurse Notes: Pt declined cognition testing stating she'd rather not at this time, pt was knowledgeable to answer questions asked

## 2023-01-06 ENCOUNTER — Ambulatory Visit: Payer: Medicare Other | Admitting: Psychology

## 2023-01-19 ENCOUNTER — Ambulatory Visit (INDEPENDENT_AMBULATORY_CARE_PROVIDER_SITE_OTHER): Payer: Medicare Other | Admitting: Psychology

## 2023-01-19 DIAGNOSIS — F331 Major depressive disorder, recurrent, moderate: Secondary | ICD-10-CM | POA: Diagnosis not present

## 2023-01-19 NOTE — Progress Notes (Signed)
[facility] Counselor/Therapist Progress Note  Patient ID: Sandra Craig, MRN: [id number],    Date: 01/19/2023  Time Spent: 11:00am-11:45am    45 minutes   Treatment Type: Individual Therapy  Reported Symptoms: stress  Mental Status Exam: Appearance:  Casual     Behavior: Appropriate  Motor: Normal  Speech/Language:  Normal Rate  Affect: Appropriate  Mood: normal  Thought process: normal  Thought content:   WNL  Sensory/Perceptual disturbances:   WNL  Orientation: oriented to person, place, time/date, and situation  Attention: Good  Concentration: Good  Memory: WNL  Fund of knowledge:  Good  Insight:   Good  Judgment:  Good  Impulse Control: Good   Risk Assessment: Danger to Self:  No Self-injurious Behavior: No Danger to Others: No Duty to Warn:no Physical Aggression / Violence:No  Access to Firearms a concern: No  Gang Involvement:No   Subjective: Pt present for face-to-face individual therapy via video.  Pt consents to telehealth video session and is aware of limitations of virtual sessions. Location of pt: home Location of therapist: home office.   Pt talked about her health.   Pt's voice / throat is feeling better.  Pt feels recuperated from the radiation treatments.   Pt has been doing a lot of reading which she enjoys.   Pt feels that she "should be doing more".   Helped pt process the issues and worked on mindfulness and developing a Hydrographic surveyor.  Addressed how pt can connect more with friends and have things to look forward to.  Worked on self care strategies.  Provided supportive therapy.   Interventions: Cognitive Behavioral Therapy and Insight-Oriented  Diagnosis: F33.1  Plan of Care: Recommend ongoing therapy.   Pt participated in setting treatment goals.  Pt wants to have someone to talk to and to improve coping skills.  Pt wants to feel less anxious and depressed.  Plan to meet every two weeks.    Treatment Plan  (Treatment Plan Target Date: 10/27/2023) Client Abilities/Strengths  Pt is bright, engaging, and motivated for therapy.   Client Treatment Preferences  Individual therapy.  Client Statement of Needs  Improve coping skills.  Symptoms  Depressed or irritable mood. Diminished interest in or enjoyment of activities. Lack of energy. Feelings of hopelessness, worthlessness, or inappropriate guilt. Unresolved grief issues.   Problems Addressed  Unipolar Depression Goals 1. Alleviate depressive symptoms and return to previous level of effective functioning. 2. Appropriately grieve the loss in order to normalize mood and to return to previously adaptive level of functioning. Objective Learn and implement behavioral strategies to overcome depression. Target Date: 2023-10-27 Frequency: Biweekly  Progress: 30 Modality: individual  Related Interventions Engage the client in "behavioral activation," increasing his/her activity level and contact with sources of reward, while identifying processes that inhibit activation.  Use behavioral techniques such as instruction, rehearsal, role-playing, role reversal, as needed, to facilitate activity in the client's daily life; reinforce success. Assist the client in developing skills that increase the likelihood of deriving pleasure from behavioral activation (e.g., assertiveness skills, developing an exercise plan, less internal/more external focus, increased social involvement); reinforce success. Objective Identify important people in life, past and present, and describe the quality, good and poor, of those relationships. Target Date: 2023-10-27 Frequency: Biweekly  Progress: 30 Modality: individual  Related Interventions Conduct Interpersonal Therapy beginning with the assessment of the client's "interpersonal inventory" of important past and present relationships; develop a case formulation linking depression to grief, interpersonal role disputes, role  transitions, and/or  interpersonal deficits). Objective Learn and implement problem-solving and decision-making skills. Target Date: 2023-10-27 Frequency: Biweekly  Progress: 30 Modality: individual  Related Interventions Conduct Problem-Solving Therapy using techniques such as psychoeducation, modeling, and role-playing to teach client problem-solving skills (i.e., defining a problem specifically, generating possible solutions, evaluating the pros and cons of each solution, selecting and implementing a plan of action, evaluating the efficacy of the plan, accepting or revising the plan); role-play application of the problem-solving skill to a real life issue. Encourage in the client the development of a positive problem orientation in which problems and solving them are viewed as a natural part of life and not something to be feared, despaired, or avoided. 3. Develop healthy interpersonal relationships that lead to the alleviation and help prevent the relapse of depression. 4. Develop healthy thinking patterns and beliefs about self, others, and the world that lead to the alleviation and help prevent the relapse of depression. 5. Recognize, accept, and cope with feelings of depression. Diagnosis F33.1  Conditions For Discharge Achievement of treatment goals and objectives   Salomon Fick, LCSW

## 2023-01-24 ENCOUNTER — Ambulatory Visit: Payer: Medicare Other | Admitting: Thoracic Surgery (Cardiothoracic Vascular Surgery)

## 2023-01-24 ENCOUNTER — Encounter: Payer: Self-pay | Admitting: Thoracic Surgery (Cardiothoracic Vascular Surgery)

## 2023-01-24 VITALS — BP 125/85 | HR 96 | Resp 20 | Ht 64.0 in | Wt 135.0 lb

## 2023-01-24 DIAGNOSIS — Z9889 Other specified postprocedural states: Secondary | ICD-10-CM | POA: Diagnosis not present

## 2023-01-24 NOTE — Progress Notes (Signed)
[address]       [name] [address]             [phone]     HPI: Sandra Craig returns today for scheduled 78-month follow-up after resection of an anterior mediastinal mass.  Sandra Craig is an 82 year old woman with a past medical history significant for hypertension, hyperlipidemia, MAC, fibromyalgia, endometriosis, hiatal hernia, arthritis, interstitial cystitis, osteopenia, osteoporosis, small bowel obstruction, TIA, and an anterior mediastinal mass.  She presented back in December 2023 with chest pain.  CT showed no evidence of pulmonary embolus but there was a 4.5 x 4 cm anterior mediastinal mass.  In retrospect it had been present back in August 2020 and was 1.6 cm at that time.  On PET/CT it was hypermetabolic.  She underwent a robotic left VATS for thymectomy.  Final pathology showed squamous cell carcinoma.  Question was whether this was a thymic carcinoma with squamous differentiation versus metastatic squamous cell from the lung..  She did well initially but a few days after surgery developed hoarseness.  Regardless because of the lack of wide margin she underwent adjuvant radiation therapy.  She had persistent hoarseness.  She saw Dr. Rubye Oaks of ENT.  She had left vocal cord paralysis.  She had an injection with improvement in her voice although when she talks for a long time her voice will start to get very weak.  Still has occasional discomfort from her incisions and has a numb area along the costal margin.  Not taking anything for that other than Neurontin 300 mg twice daily.  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   Chronic insomnia    Chronic kidney disease    Cystitis    Depression    Diverticulitis of intestine without perforation or abscess without bleeding    Patient did have abscess but noperforation   Diverticulosis of colon (without mention of hemorrhage)     Endometriosis    Family history of malignant neoplasm of gastrointestinal tract    Fibromyalgia    Gastritis    Hiatal hernia    HIATAL HERNIA 10/08/2008   Qualifier: Diagnosis of  By: Koleen Distance CMA Duncan Dull), Hulan Saas     History of gallstones    HSV-1 infection    Hyperlipidemia    Hypertension    IBS (irritable bowel syndrome)    IC (interstitial cystitis)    Internal hemorrhoid    Mycobacterium avium complex (HCC)    history- took antibiotics and completed course   Osteonecrosis (HCC)    Osteopenia    Osteoporosis    Palpitations    Polycystic kidney disease    pt denies   Pulmonary nodule 07/2007   5 mm Anterior RUL   Small bowel obstruction (HCC)    Stroke (HCC) 2021   TIA    Current Outpatient Medications  Medication Sig Dispense Refill   aspirin 81 MG tablet Take 1 tablet (81 mg total) by mouth daily. 30 tablet    atorvastatin (LIPITOR) 40 MG tablet Take 1 tablet (40 mg total) by mouth daily. 90 tablet 3   Calcium Citrate-Vitamin D (CALCIUM + D PO) Take 1 tablet by mouth 2 (two) times daily.     DULoxetine (CYMBALTA) 30 MG capsule TAKE ONE CAPSULE BY MOUTH TWICE DAILY 180 capsule 3   famotidine (PEPCID) 20 MG tablet TAKE 1 TABLET(20 MG) BY MOUTH TWICE DAILY  180 tablet 1   gabapentin (NEURONTIN) 300 MG capsule TAKE 1 CAPSULE(300 MG) BY MOUTH TWICE DAILY AS NEEDED FOR PAIN (Patient taking differently: Take 300 mg by mouth 2 (two) times daily.) 180 capsule 3   losartan (COZAAR) 25 MG tablet Take 12.5 mg by mouth daily.     Multiple Vitamins-Minerals (MULTIVITAMIN WITH MINERALS) tablet Take 1 tablet by mouth daily.     Polyethyl Glycol-Propyl Glycol (SYSTANE) 0.4-0.3 % GEL ophthalmic gel Place 1 application into both eyes 3 (three) times daily as needed (dry eye).     sucralfate (CARAFATE) 1 g tablet Take 1 tablet (1 g total) by mouth 4 (four) times daily. Dissolve each tablet in 15 cc water before use. 120 tablet 2   zolpidem (AMBIEN) 5 MG tablet TAKE 1 TABLET BY MOUTH EVERY DAY  AT BEDTIME FOR SLEEP 90 tablet 1   No current facility-administered medications for this visit.    Physical Exam BP 125/85 (BP Location: Left Arm, Patient Position: Sitting, Cuff Size: Normal)   Pulse 96   Resp 20   Ht 5\' 4"  (1.626 m)   Wt 135 lb (61.2 kg)   LMP 08/25/1992   SpO2 94% Comment: RA  BMI 23.54 kg/m  82 year old woman in no acute distress Alert and oriented x 3 Occasionally voice "cracks" Lungs clear bilaterally No cervical or supraclavicular adenopathy  Diagnostic Tests: CT CHEST WITHOUT CONTRAST   TECHNIQUE: Multidetector CT imaging of the chest was performed following the standard protocol without IV contrast.   RADIATION DOSE REDUCTION: This exam was performed according to the departmental dose-optimization program which includes automated exposure control, adjustment of the mA and/or kV according to patient size and/or use of iterative reconstruction technique.   COMPARISON:  Chest CT 08/26/2022   FINDINGS: Cardiovascular: On this non IV contrast exam the heart is nonenlarged. No pericardial effusion. Coronary artery calcifications are seen. On this non IV contrast exam the thoracic aorta has a normal course and caliber with some scattered vascular calcifications.   Mediastinum/Nodes: No discrete abnormal lymph node enlargement identified in the axillary region or hilum on this noncontrast examination. Small anterior cardiophrenic lymph node on the left side is smaller today. Previously this measured 14 by 7 mm and today on series 2, image 103 10 x 6 mm. Small areas of nodularity in the anterior lower mediastinum are slightly decreased today such as series 2, image 109. The lenticular area fluid like density along the anterior margin of the pericardium on the right side is smaller today. Overall decreasing fluid in the pericardial recesses. Residual area on series 2, image 74 measures thickness of 4 mm. Previously 11 mm in retrospect. Otherwise no  new abnormal lymph node enlargement identified in the mediastinum.   Lungs/Pleura: Decrease in tiny left pleural effusion with trace residual. Few air cysts are identified in the lungs bilaterally. There continues to be tree-in-bud bilateral lung areas of nodularity with the areas of bronchiectasis and bronchial wall thickening. Areas in the right upper lobe has significantly increased with some consolidative like opacity in the medial right upper lobe on series 6 image 40. Areas in the lingula and medial aspect of the left upper lobe have also increased. Is also some bandlike opacities which have increased in the medial left lower lobe. Differential would include atypical infection. Recommend close follow-up. There are some areas which have improved. The 9 mm nodule in the peripheral right upper lobe on the prior, today has a centrally resolved.   Upper Abdomen: Adrenal  glands are preserved in the upper abdomen. Previous cholecystectomy.   Musculoskeletal: Streak artifact related to the patient's right shoulder arthroplasty. Healing left-sided rib fractures. Mild degenerative changes along the spine. Slight Schmorl's node deformities as well at multiple levels in the thoracic spine.   IMPRESSION: Decreasing mediastinal areas of nodularity in the lymph nodes.   Decreasing left effusion with trace residual.   Multifocal tree-in-bud like lung nodularity and opacity with bronchiectasis. Several areas have increased today with more confluence areas particularly in the upper lobes and medial left lower lobe. There are some areas which have improved in the right upper lobe compared to the prior CT scan as well. This has a differential including atypical infection recommend continued close follow-up.   Partial interval healing of left-sided previously seen rib fractures.   Aortic Atherosclerosis (ICD10-I70.0) and Emphysema (ICD10-J43.9).     Electronically Signed   By: Karen Kays M.D.   On: 01/02/2023 16:04   I personally reviewed the CT images.  Complex scan with previous surgery and radiation changes.  No apparent recurrent disease.  Multiple areas of tree-in-bud nodularity consistent with known MAC.  Impression: Sandra Craig is an 82 year old woman with a past medical history significant for hypertension, hyperlipidemia, MAC, fibromyalgia, endometriosis, hiatal hernia, arthritis, interstitial cystitis, osteopenia, osteoporosis, small bowel obstruction, TIA, and an anterior mediastinal mass.   Anterior mediastinal mass-resected 6 months ago.  Complicated by left vocal cord paralysis.  Treated with postoperative adjuvant radiation.  Tolerated that well.  Currently doing well with no evidence of recurrent disease.  Vocal cord paralysis-she had an injection a couple of months ago.  That did improve things dramatically.  Still does get some high-pitched or weak voice when she talks a lot.  Plan: Follow-up as scheduled with Dr. Rubye Oaks Follow-up as scheduled with Dr. Shirline Frees Return in 6 months after CT chest for 1 year follow-up  Loreli Slot, MD [facility] [phone]

## 2023-02-13 DIAGNOSIS — M47816 Spondylosis without myelopathy or radiculopathy, lumbar region: Secondary | ICD-10-CM | POA: Diagnosis not present

## 2023-02-21 DIAGNOSIS — J3801 Paralysis of vocal cords and larynx, unilateral: Secondary | ICD-10-CM | POA: Diagnosis not present

## 2023-02-21 DIAGNOSIS — R490 Dysphonia: Secondary | ICD-10-CM | POA: Diagnosis not present

## 2023-02-21 DIAGNOSIS — J3800 Paralysis of vocal cords and larynx, unspecified: Secondary | ICD-10-CM | POA: Diagnosis not present

## 2023-02-24 DIAGNOSIS — M47816 Spondylosis without myelopathy or radiculopathy, lumbar region: Secondary | ICD-10-CM | POA: Diagnosis not present

## 2023-02-27 ENCOUNTER — Other Ambulatory Visit: Payer: Self-pay | Admitting: Family Medicine

## 2023-02-28 ENCOUNTER — Ambulatory Visit (INDEPENDENT_AMBULATORY_CARE_PROVIDER_SITE_OTHER): Payer: Medicare Other | Admitting: Psychology

## 2023-02-28 DIAGNOSIS — F331 Major depressive disorder, recurrent, moderate: Secondary | ICD-10-CM

## 2023-02-28 NOTE — Progress Notes (Signed)
[facility] Counselor/Therapist Progress Note  Patient ID: Sandra Craig, MRN: [id number],    Date: 02/28/2023  Time Spent: 9:00am-9:45am    45 minutes   Treatment Type: Individual Therapy  Reported Symptoms: stress  Mental Status Exam: Appearance:  Casual     Behavior: Appropriate  Motor: Normal  Speech/Language:  Normal Rate  Affect: Appropriate  Mood: normal  Thought process: normal  Thought content:   WNL  Sensory/Perceptual disturbances:   WNL  Orientation: oriented to person, place, time/date, and situation  Attention: Good  Concentration: Good  Memory: WNL  Fund of knowledge:  Good  Insight:   Good  Judgment:  Good  Impulse Control: Good   Risk Assessment: Danger to Self:  No Self-injurious Behavior: No Danger to Others: No Duty to Warn:no Physical Aggression / Violence:No  Access to Firearms a concern: No  Gang Involvement:No   Subjective: Pt present for face-to-face individual therapy via video.  Pt consents to telehealth video session and is aware of limitations of virtual sessions. Location of pt: home Location of therapist: home office.   Pt talked about her health.   Pt had to have a back procedure that she is healing from.  Pt had gotten to the point where she could hardly walk.  She hopes the procedure will help. Pt's husband Sandra Craig has also been having health issues.  He fell last week and hurt his shoulder. They are trying to figure out getting help for their care needs.   Pt wants to have meals delivered and see if they can have some help in the home for caregiving.   Addressed pt's concerns about her and her husband's health.   Pt states she is not as depressed as she was bc she is working on Physicist, medical.  She has also started to read again a lot which is something she enjoys.  Pt continues to go to [employer] which is a good source of support for her.  Pt states she struggles with resentment at times toward Sandra Craig.   Helped pt  process her feelings.   Worked on mindfulness and developing a Hydrographic surveyor.  Addressed how pt can connect more with friends and have things to look forward to.  Worked on self care strategies.  Provided supportive therapy.   Interventions: Cognitive Behavioral Therapy and Insight-Oriented  Diagnosis: F33.1  Plan of Care: Recommend ongoing therapy.   Pt participated in setting treatment goals.  Pt wants to have someone to talk to and to improve coping skills.  Pt wants to feel less anxious and depressed.  Plan to meet every two weeks.    Treatment Plan (Treatment Plan Target Date: 10/27/2023) Client Abilities/Strengths  Pt is bright, engaging, and motivated for therapy.   Client Treatment Preferences  Individual therapy.  Client Statement of Needs  Improve coping skills.  Symptoms  Depressed or irritable mood. Diminished interest in or enjoyment of activities. Lack of energy. Feelings of hopelessness, worthlessness, or inappropriate guilt. Unresolved grief issues.   Problems Addressed  Unipolar Depression Goals 1. Alleviate depressive symptoms and return to previous level of effective functioning. 2. Appropriately grieve the loss in order to normalize mood and to return to previously adaptive level of functioning. Objective Learn and implement behavioral strategies to overcome depression. Target Date: 2023-10-27 Frequency: Biweekly  Progress: 30 Modality: individual  Related Interventions Engage the client in "behavioral activation," increasing his/her activity level and contact with sources of reward, while identifying processes that inhibit activation.  Use behavioral  techniques such as instruction, rehearsal, role-playing, role reversal, as needed, to facilitate activity in the client's daily life; reinforce success. Assist the client in developing skills that increase the likelihood of deriving pleasure from behavioral activation (e.g., assertiveness skills, developing  an exercise plan, less internal/more external focus, increased social involvement); reinforce success. Objective Identify important people in life, past and present, and describe the quality, good and poor, of those relationships. Target Date: 2023-10-27 Frequency: Biweekly  Progress: 30 Modality: individual  Related Interventions Conduct Interpersonal Therapy beginning with the assessment of the client's "interpersonal inventory" of important past and present relationships; develop a case formulation linking depression to grief, interpersonal role disputes, role transitions, and/or interpersonal deficits). Objective Learn and implement problem-solving and decision-making skills. Target Date: 2023-10-27 Frequency: Biweekly  Progress: 30 Modality: individual  Related Interventions Conduct Problem-Solving Therapy using techniques such as psychoeducation, modeling, and role-playing to teach client problem-solving skills (i.e., defining a problem specifically, generating possible solutions, evaluating the pros and cons of each solution, selecting and implementing a plan of action, evaluating the efficacy of the plan, accepting or revising the plan); role-play application of the problem-solving skill to a real life issue. Encourage in the client the development of a positive problem orientation in which problems and solving them are viewed as a natural part of life and not something to be feared, despaired, or avoided. 3. Develop healthy interpersonal relationships that lead to the alleviation and help prevent the relapse of depression. 4. Develop healthy thinking patterns and beliefs about self, others, and the world that lead to the alleviation and help prevent the relapse of depression. 5. Recognize, accept, and cope with feelings of depression. Diagnosis F33.1  Conditions For Discharge Achievement of treatment goals and objectives   Salomon Fick, LCSW

## 2023-03-01 DIAGNOSIS — D631 Anemia in chronic kidney disease: Secondary | ICD-10-CM | POA: Diagnosis not present

## 2023-03-01 DIAGNOSIS — N1832 Chronic kidney disease, stage 3b: Secondary | ICD-10-CM | POA: Diagnosis not present

## 2023-03-01 DIAGNOSIS — E785 Hyperlipidemia, unspecified: Secondary | ICD-10-CM | POA: Diagnosis not present

## 2023-03-01 DIAGNOSIS — N189 Chronic kidney disease, unspecified: Secondary | ICD-10-CM | POA: Diagnosis not present

## 2023-03-01 DIAGNOSIS — I129 Hypertensive chronic kidney disease with stage 1 through stage 4 chronic kidney disease, or unspecified chronic kidney disease: Secondary | ICD-10-CM | POA: Diagnosis not present

## 2023-03-02 LAB — LAB REPORT - SCANNED: EGFR: 27

## 2023-03-07 DIAGNOSIS — J3801 Paralysis of vocal cords and larynx, unilateral: Secondary | ICD-10-CM | POA: Diagnosis not present

## 2023-03-07 DIAGNOSIS — J3800 Paralysis of vocal cords and larynx, unspecified: Secondary | ICD-10-CM | POA: Diagnosis not present

## 2023-03-07 DIAGNOSIS — R490 Dysphonia: Secondary | ICD-10-CM | POA: Diagnosis not present

## 2023-03-09 DIAGNOSIS — M533 Sacrococcygeal disorders, not elsewhere classified: Secondary | ICD-10-CM | POA: Diagnosis not present

## 2023-03-09 DIAGNOSIS — M47816 Spondylosis without myelopathy or radiculopathy, lumbar region: Secondary | ICD-10-CM | POA: Diagnosis not present

## 2023-03-22 DIAGNOSIS — J3801 Paralysis of vocal cords and larynx, unilateral: Secondary | ICD-10-CM | POA: Diagnosis not present

## 2023-03-22 DIAGNOSIS — R490 Dysphonia: Secondary | ICD-10-CM | POA: Diagnosis not present

## 2023-03-22 DIAGNOSIS — J3800 Paralysis of vocal cords and larynx, unspecified: Secondary | ICD-10-CM | POA: Diagnosis not present

## 2023-03-27 DIAGNOSIS — M533 Sacrococcygeal disorders, not elsewhere classified: Secondary | ICD-10-CM | POA: Diagnosis not present

## 2023-03-30 ENCOUNTER — Ambulatory Visit: Payer: Medicare Other | Admitting: Psychology

## 2023-03-31 NOTE — Progress Notes (Deleted)
     [address]       [name] [address]             [phone]   HPI: This is an 82 year old female who is known to [facility] as she had an Xi robotic-assisted left thoracoscopy for thymectomy/resection of anterior mediastinal mass by Dr.Hendrickson on 08/05/2022. She had post op adjuvant radiation. She was last seen by Dr.Hendrickson on 01/24/2023. At that time, she had persistent hoarseness. She saw  Dr. Rubye Oaks of ENT and was found to have left vocal cord paralysis.  She had an injection with improvement in her voice although when she talks for a long time her voice will start to get very weak. She contacted the office as she has complaints of incisional pain.   Current Outpatient Medications  Medication Sig Dispense Refill   aspirin 81 MG tablet Take 1 tablet (81 mg total) by mouth daily. 30 tablet    atorvastatin (LIPITOR) 40 MG tablet TAKE 1 TABLET(40 MG) BY MOUTH DAILY 90 tablet 3   Calcium Citrate-Vitamin D (CALCIUM + D PO) Take 1 tablet by mouth 2 (two) times daily.     DULoxetine (CYMBALTA) 30 MG capsule TAKE ONE CAPSULE BY MOUTH TWICE DAILY 180 capsule 3   famotidine (PEPCID) 20 MG tablet TAKE 1 TABLET(20 MG) BY MOUTH TWICE DAILY 180 tablet 1   gabapentin (NEURONTIN) 300 MG capsule TAKE 1 CAPSULE(300 MG) BY MOUTH TWICE DAILY AS NEEDED FOR PAIN (Patient taking differently: Take 300 mg by mouth 2 (two) times daily.) 180 capsule 3   losartan (COZAAR) 25 MG tablet Take 12.5 mg by mouth daily.     Multiple Vitamins-Minerals (MULTIVITAMIN WITH MINERALS) tablet Take 1 tablet by mouth daily.     Polyethyl Glycol-Propyl Glycol (SYSTANE) 0.4-0.3 % GEL ophthalmic gel Place 1 application into both eyes 3 (three) times daily as needed (dry eye).     sucralfate (CARAFATE) 1 g tablet Take 1 tablet (1 g total) by mouth 4 (four) times daily. Dissolve each tablet in 15 cc water before use. 120 tablet 2   zolpidem (AMBIEN) 5 MG tablet TAKE 1 TABLET BY MOUTH EVERY DAY AT BEDTIME  FOR SLEEP 90 tablet 1  Vital Signs:   Physical Exam: CV Pulmonary Wound  Diagnostic Tests: ***  Impression and Plan: ***    Ardelle Balls, PA-C [facility] [phone]

## 2023-04-04 ENCOUNTER — Ambulatory Visit (INDEPENDENT_AMBULATORY_CARE_PROVIDER_SITE_OTHER): Payer: Medicare Other | Admitting: Psychology

## 2023-04-04 DIAGNOSIS — F331 Major depressive disorder, recurrent, moderate: Secondary | ICD-10-CM | POA: Diagnosis not present

## 2023-04-04 NOTE — Progress Notes (Signed)
[facility] Counselor/Therapist Progress Note  Patient ID: Sandra Craig, MRN: [id number],    Date: 04/04/2023  Time Spent: 11:00am-11:45am    45 minutes   Treatment Type: Individual Therapy  Reported Symptoms: stress  Mental Status Exam: Appearance:  Casual     Behavior: Appropriate  Motor: Normal  Speech/Language:  Normal Rate  Affect: Appropriate  Mood: normal  Thought process: normal  Thought content:   WNL  Sensory/Perceptual disturbances:   WNL  Orientation: oriented to person, place, time/date, and situation  Attention: Good  Concentration: Good  Memory: WNL  Fund of knowledge:  Good  Insight:   Good  Judgment:  Good  Impulse Control: Good   Risk Assessment: Danger to Self:  No Self-injurious Behavior: No Danger to Others: No Duty to Warn:no Physical Aggression / Violence:No  Access to Firearms a concern: No  Gang Involvement:No   Subjective: Pt present for face-to-face individual therapy via video.  Pt consents to telehealth video session and is aware of limitations and benefits of virtual sessions. Location of pt: home Location of therapist: home office.   Pt talked about [name]'s health.   He was recently diagnosed with lung cancer.  Addressed how this has impacted pt.   They will meet with the doctors to get the treatment plan on Sept 10th.    Addressed pt's concerns about her and her husband's health.   Pt states at times she is struggling emotionally bc she has had so much to deal with the past few years.  Helped pt process her feelings and worked on Pharmacologist.    Worked on mindfulness and developing a Hydrographic surveyor.  Addressed how pt can connect more with friends and create things to look forward to.  Worked on self care strategies.  Provided supportive therapy.   Interventions: Cognitive Behavioral Therapy and Insight-Oriented  Diagnosis: F33.1  Plan of Care: Recommend ongoing therapy.   Pt participated in  setting treatment goals.  Pt wants to have someone to talk to and to improve coping skills.  Pt wants to feel less anxious and depressed.  Plan to meet every two weeks.    Treatment Plan (Treatment Plan Target Date: 10/27/2023) Client Abilities/Strengths  Pt is bright, engaging, and motivated for therapy.   Client Treatment Preferences  Individual therapy.  Client Statement of Needs  Improve coping skills.  Symptoms  Depressed or irritable mood. Diminished interest in or enjoyment of activities. Lack of energy. Feelings of hopelessness, worthlessness, or inappropriate guilt. Unresolved grief issues.   Problems Addressed  Unipolar Depression Goals 1. Alleviate depressive symptoms and return to previous level of effective functioning. 2. Appropriately grieve the loss in order to normalize mood and to return to previously adaptive level of functioning. Objective Learn and implement behavioral strategies to overcome depression. Target Date: 2023-10-27 Frequency: Biweekly  Progress: 30 Modality: individual  Related Interventions Engage the client in "behavioral activation," increasing his/her activity level and contact with sources of reward, while identifying processes that inhibit activation.  Use behavioral techniques such as instruction, rehearsal, role-playing, role reversal, as needed, to facilitate activity in the client's daily life; reinforce success. Assist the client in developing skills that increase the likelihood of deriving pleasure from behavioral activation (e.g., assertiveness skills, developing an exercise plan, less internal/more external focus, increased social involvement); reinforce success. Objective Identify important people in life, past and present, and describe the quality, good and poor, of those relationships. Target Date: 2023-10-27 Frequency: Biweekly  Progress: 30 Modality:  individual  Related Interventions Conduct Interpersonal Therapy beginning with the  assessment of the client's "interpersonal inventory" of important past and present relationships; develop a case formulation linking depression to grief, interpersonal role disputes, role transitions, and/or interpersonal deficits). Objective Learn and implement problem-solving and decision-making skills. Target Date: 2023-10-27 Frequency: Biweekly  Progress: 30 Modality: individual  Related Interventions Conduct Problem-Solving Therapy using techniques such as psychoeducation, modeling, and role-playing to teach client problem-solving skills (i.e., defining a problem specifically, generating possible solutions, evaluating the pros and cons of each solution, selecting and implementing a plan of action, evaluating the efficacy of the plan, accepting or revising the plan); role-play application of the problem-solving skill to a real life issue. Encourage in the client the development of a positive problem orientation in which problems and solving them are viewed as a natural part of life and not something to be feared, despaired, or avoided. 3. Develop healthy interpersonal relationships that lead to the alleviation and help prevent the relapse of depression. 4. Develop healthy thinking patterns and beliefs about self, others, and the world that lead to the alleviation and help prevent the relapse of depression. 5. Recognize, accept, and cope with feelings of depression. Diagnosis F33.1  Conditions For Discharge Achievement of treatment goals and objectives   Salomon Fick, LCSW

## 2023-04-06 ENCOUNTER — Other Ambulatory Visit: Payer: Self-pay | Admitting: Family Medicine

## 2023-04-06 ENCOUNTER — Ambulatory Visit: Payer: Medicare Other

## 2023-04-10 ENCOUNTER — Other Ambulatory Visit: Payer: Self-pay

## 2023-04-10 ENCOUNTER — Other Ambulatory Visit: Payer: Self-pay | Admitting: Family Medicine

## 2023-04-10 ENCOUNTER — Encounter: Payer: Self-pay | Admitting: Family Medicine

## 2023-04-10 MED ORDER — ZOLPIDEM TARTRATE 5 MG PO TABS: ORAL_TABLET | ORAL | 1 refills | Status: DC

## 2023-04-21 ENCOUNTER — Ambulatory Visit (INDEPENDENT_AMBULATORY_CARE_PROVIDER_SITE_OTHER): Payer: Medicare Other | Admitting: Psychology

## 2023-04-21 DIAGNOSIS — F331 Major depressive disorder, recurrent, moderate: Secondary | ICD-10-CM | POA: Diagnosis not present
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
## 2023-05-02 DIAGNOSIS — M47816 Spondylosis without myelopathy or radiculopathy, lumbar region: Secondary | ICD-10-CM | POA: Diagnosis not present

## 2023-05-02 DIAGNOSIS — M533 Sacrococcygeal disorders, not elsewhere classified: Secondary | ICD-10-CM | POA: Diagnosis not present

## 2023-05-09 ENCOUNTER — Ambulatory Visit (INDEPENDENT_AMBULATORY_CARE_PROVIDER_SITE_OTHER): Payer: Medicare Other | Admitting: Psychology

## 2023-05-09 DIAGNOSIS — F331 Major depressive disorder, recurrent, moderate: Secondary | ICD-10-CM | POA: Diagnosis not present
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
## 2023-05-15 DIAGNOSIS — Z1231 Encounter for screening mammogram for malignant neoplasm of breast: Secondary | ICD-10-CM | POA: Diagnosis not present

## 2023-05-15 LAB — HM MAMMOGRAPHY

## 2023-05-16 ENCOUNTER — Encounter: Payer: Self-pay | Admitting: Family Medicine

## 2023-05-18 DIAGNOSIS — L570 Actinic keratosis: Secondary | ICD-10-CM | POA: Diagnosis not present

## 2023-05-18 DIAGNOSIS — Z85828 Personal history of other malignant neoplasm of skin: Secondary | ICD-10-CM | POA: Diagnosis not present

## 2023-05-18 DIAGNOSIS — L821 Other seborrheic keratosis: Secondary | ICD-10-CM | POA: Diagnosis not present

## 2023-05-18 DIAGNOSIS — D692 Other nonthrombocytopenic purpura: Secondary | ICD-10-CM | POA: Diagnosis not present

## 2023-05-18 DIAGNOSIS — D1801 Hemangioma of skin and subcutaneous tissue: Secondary | ICD-10-CM | POA: Diagnosis not present

## 2023-05-25 ENCOUNTER — Encounter: Payer: Self-pay | Admitting: Family Medicine

## 2023-06-02 ENCOUNTER — Ambulatory Visit (INDEPENDENT_AMBULATORY_CARE_PROVIDER_SITE_OTHER): Payer: Medicare Other | Admitting: Family Medicine

## 2023-06-02 ENCOUNTER — Encounter: Payer: Self-pay | Admitting: Family Medicine

## 2023-06-02 VITALS — BP 110/72 | HR 94 | Temp 98.2°F | Ht 64.0 in | Wt 141.4 lb

## 2023-06-02 DIAGNOSIS — Z23 Encounter for immunization: Secondary | ICD-10-CM

## 2023-06-02 DIAGNOSIS — N1832 Chronic kidney disease, stage 3b: Secondary | ICD-10-CM | POA: Diagnosis not present

## 2023-06-02 DIAGNOSIS — F3342 Major depressive disorder, recurrent, in full remission: Secondary | ICD-10-CM | POA: Diagnosis not present

## 2023-06-02 DIAGNOSIS — I10 Essential (primary) hypertension: Secondary | ICD-10-CM | POA: Diagnosis not present

## 2023-06-02 NOTE — Patient Instructions (Addendum)
Let us know if you decide to get your 2nd [facility] vaccine at your pharmacy.  With all the stress you are under I love your idea of getting a few days away from it all to clear your mind and help you think more clearly and make some longer term decisions. I think you certainly some time each week where you can get away- utilize the hours you have available.   Also consider couples therapy  Labs next visit  Recommended follow up: Return in about 6 months (around 11/30/2023) for physical or sooner if needed.Schedule b4 you leave.

## 2023-06-02 NOTE — Progress Notes (Signed)
Phone [phone] In person visit   Subjective:   Sandra Craig is a 82 y.o. year old very pleasant female patient who presents for/with See problem oriented charting Chief Complaint  Patient presents with   Medical Management of Chronic Issues   Hypertension   Hyperlipidemia   Depression    Pt states she does not think her meds are working as they should.    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    Priority: High   Chronic low back pain 12/20/2013    Priority: High   Fibromyalgia 12/05/2007    Priority: High   UTI (urinary tract infection) 12/15/2018    Priority: Medium    Chronic kidney disease, stage 3b (HCC) 06/02/2017    Priority: Medium    Near syncope 03/11/2017    Priority: Medium    Hyperglycemia 06/27/2012    Priority: Medium    History of small bowel obstruction 06/28/2011    Priority: Medium    GERD with stricture 06/28/2011    Priority: Medium    Hypertension     Priority: Medium    Hyperlipidemia     Priority: Medium    INSOMNIA, CHRONIC 10/08/2008    Priority: Medium    Depression 02/08/2008    Priority: Medium    Facet arthropathy 09/14/2019    Priority: Low   Vaginal dryness 01/29/2019    Priority: Low   Chronic right shoulder pain 03/07/2014    Priority: Low   Benign essential tremor 10/09/2013    Priority: Low   Diverticulitis large intestine 02/11/2012    Priority: Low   Irritable bowel syndrome (IBS) 06/28/2011    Priority: Low   IC (interstitial cystitis)     Priority: Low   Hemorrhoids 10/08/2008    Priority: Low   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 30 tablet    atorvastatin (LIPITOR) 40 MG tablet TAKE 1 TABLET(40 MG) BY MOUTH DAILY 90 tablet 3   Calcium Citrate-Vitamin D (CALCIUM + D PO) Take 1 tablet by mouth 2 (two) times daily.     DULoxetine (CYMBALTA) 30 MG capsule TAKE ONE CAPSULE BY MOUTH TWICE DAILY 180 capsule 3   famotidine (PEPCID) 20 MG tablet TAKE 1 TABLET(20 MG) BY MOUTH TWICE DAILY 180 tablet 1   gabapentin (NEURONTIN) 300 MG capsule TAKE 1 CAPSULE(300 MG) BY MOUTH TWICE DAILY AS NEEDED FOR PAIN (Patient taking differently: Take 300 mg by mouth 2 (two) times daily.) 180 capsule 3   losartan (COZAAR) 25 MG tablet Take 12.5 mg by mouth daily.     Multiple Vitamins-Minerals (MULTIVITAMIN WITH MINERALS) tablet Take 1 tablet by mouth daily.     Polyethyl Glycol-Propyl Glycol (SYSTANE) 0.4-0.3 % GEL ophthalmic gel Place 1 application into both eyes 3 (three) times daily as needed (dry eye).  sucralfate (CARAFATE) 1 g tablet Take 1 tablet (1 g total) by mouth 4 (four) times daily. Dissolve each tablet in 15 cc water before use. 120 tablet 2   zolpidem (AMBIEN) 5 MG tablet TAKE 1 TABLET BY MOUTH EVERY DAY AT BEDTIME FOR SLEEP 90 tablet 1   No current facility-administered medications for this visit.     Objective:  BP 110/72   Pulse 94   Temp 98.2 F (36.8 C)   Ht 5\' 4"  (1.626 m)   Wt 141 lb 6.4 oz (64.1 kg)   LMP 08/25/1992   SpO2 99%   BMI 24.27 kg/m  Gen: NAD, resting comfortably CV: RRR no murmurs rubs or gallops Lungs: CTAB no crackles, wheeze, rhonchi Ext: no edema Skin: warm, dry     Assessment and Plan   #social update- she has had a lot on her plate personally this year and now husband is dealing with cancer  again and its not accessible where it is in the lung. Sees medical oncologist next week.    # Left lower cord paralysis-treated with gabapentin 300 mg and plan to consider injection by [facility] in March 2024-likely injury during operation for thymectomy-she also completed radiation treatments October 25, 2022 for malignant neoplasm of the thymus -Has oncology visit soon - She has had some cough and wants to establish with her primary pulmonologist from the past Dr. Kendrick Fries with [facility].  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 Cymbalta likely max dose for her renal function and she is already doing therapy.  -mentioned Wellbutrin for depression but can worsen anxiety and she has concern about that.  We also discussed possible second-generation antipsychotic-she declines this as well -She continues to find counseling very helpful and feels she needs to act on some items including creating healthier boundaries and getting some time away for herself-ultimately she decided on this as her next step-continue current medication  #hypertension with orthostatic hypotension S: medication: Losartan 12.5 mg restarted by Dr. Charline Bills presyncope but was having elevated microalbumin to creatinine ratio compared to prior --Did not report any recent presyncope A/P: Well-controlled without any reported presyncope-continue current medication   #Chronic kidney disease stage  III and on border with 4- sees Dr. Signe Colt S: GFR is typically in the 20s or 30srange -Patient knows to avoid NSAIDs  A/P: Most recent creatinine with [facility] at 1.85 in August-she wants to hold off on labs today and do at 21-month visit  Recommended follow up: Return in about 6 months (around 11/30/2023) for physical or sooner if needed.Schedule b4 you leave. Future Appointments  Date Time Provider Department Center  06/05/2023  3:00 PM Bauert, Rella Larve, LCSW [facility] None  07/03/2023 10:00 AM [facility] None  07/04/2023  2:00 PM Bauert, Rella Larve, LCSW [facility] None  07/10/2023  3:30 PM Si Gaul, MD [facility] None  01/08/2024  1:00 PM [facility] ANNUAL WELLNESS VISIT 1 [facility]  Lab/Order associations:   ICD-10-CM   1. Major depressive disorder, recurrent, in full remission (HCC)  F33.42     2. Chronic kidney disease, stage 3b (HCC)  N18.32     3. Primary hypertension  I10     4. Need for immunization against influenza  Z23 Flu Vaccine Trivalent High Dose (Fluad)     No orders of the defined types were placed in this encounter.  Return precautions advised.  Tana Conch, MD

## 2023-06-05 ENCOUNTER — Ambulatory Visit (INDEPENDENT_AMBULATORY_CARE_PROVIDER_SITE_OTHER): Payer: Medicare Other | Admitting: Psychology

## 2023-06-05 DIAGNOSIS — F331 Major depressive disorder, recurrent, moderate: Secondary | ICD-10-CM | POA: Diagnosis not present

## 2023-06-05 NOTE — Progress Notes (Signed)
[facility] Counselor/Therapist Progress Note  Patient ID: Sandra Craig, MRN: [id number],    Date: 06/05/2023  Time Spent: 3:00pm-3:55pm    55 minutes   Treatment Type: Individual Therapy  Reported Symptoms: stress  Mental Status Exam: Appearance:  Casual     Behavior: Appropriate  Motor: Normal  Speech/Language:  Normal Rate  Affect: Appropriate  Mood: normal  Thought process: normal  Thought content:   WNL  Sensory/Perceptual disturbances:   WNL  Orientation: oriented to person, place, time/date, and situation  Attention: Good  Concentration: Good  Memory: WNL  Fund of knowledge:  Good  Insight:   Good  Judgment:  Good  Impulse Control: Good   Risk Assessment: Danger to Self:  No Self-injurious Behavior: No Danger to Others: No Duty to Warn:no Physical Aggression / Violence:No  Access to Firearms a concern: No  Gang Involvement:No   Subjective: Pt present for face-to-face individual therapy via video.  Pt consents to telehealth video session and is aware of limitations and benefits of virtual sessions. Location of pt: home Location of therapist: home office.   Pt talked about feeling frustrated that she is having trouble concentrating and focusing on things.   Pt is losing things a lot.   She feels like she can not concentrate like she use to.  Pt feels anxious a lot.   Pt states she is worried about herself.   She talked with her PCP at her appointment last week.   Pt's PCP recommended pt see a psychiatrist.  Pt's PCP thinks her cognitive issues are due to stress and anxiety.  Pt does not want to be put on any more psychotropic medications bc she already takes Cymbalta and ambien.  Pt talked about [name].  [name] has an oncology appointment tomorrow where he will get his treatment plan.   Pt feels at times like she can not "take this anymore" regarding being with [name].    Pt feels trapped.  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  Pt states she doesn't feel confident.  She is always trying to please [name] and feels like it is not good enough.   Pt feels lonely.   Helped pt process her feelings.   Worked on coping strategies.   Addressed how pt can get out of the house more and connect with friends.  Worked on self care strategies.  Provided supportive therapy.   Interventions: Cognitive Behavioral Therapy and Insight-Oriented  Diagnosis: F33.1  Plan of Care: Recommend ongoing therapy.   Pt participated in setting treatment goals.  Pt wants to have someone to talk to and to improve coping skills.  Pt wants to feel less anxious and depressed.  Plan to meet every two weeks.    Treatment Plan (Treatment Plan Target Date: 10/27/2023) Client Abilities/Strengths  Pt is bright, engaging, and motivated for therapy.   Client Treatment Preferences  Individual therapy.  Client Statement of Needs  Improve coping skills.  Symptoms  Depressed or irritable mood. Diminished interest in or enjoyment of activities. Lack of energy. Feelings of hopelessness, worthlessness, or inappropriate guilt. Unresolved grief issues.   Problems Addressed  Unipolar Depression Goals 1. Alleviate depressive symptoms and return to previous level of effective functioning. 2. Appropriately grieve the loss in order to normalize mood and to return to previously adaptive level of functioning. Objective Learn and implement behavioral strategies to overcome depression. Target Date: 2023-10-27 Frequency: Biweekly  Progress: 30 Modality: individual  Related Interventions  Engage the client in "behavioral activation," increasing his/her activity level and contact with sources of reward, while identifying processes that inhibit activation.  Use behavioral techniques such as instruction, rehearsal, role-playing, role reversal, as needed, to facilitate activity in the client's daily life; reinforce success. Assist the client in developing skills  that increase the likelihood of deriving pleasure from behavioral activation (e.g., assertiveness skills, developing an exercise plan, less internal/more external focus, increased social involvement); reinforce success. Objective Identify important people in life, past and present, and describe the quality, good and poor, of those relationships. Target Date: 2023-10-27 Frequency: Biweekly  Progress: 30 Modality: individual  Related Interventions Conduct Interpersonal Therapy beginning with the assessment of the client's "interpersonal inventory" of important past and present relationships; develop a case formulation linking depression to grief, interpersonal role disputes, role transitions, and/or interpersonal deficits). Objective Learn and implement problem-solving and decision-making skills. Target Date: 2023-10-27 Frequency: Biweekly  Progress: 30 Modality: individual  Related Interventions Conduct Problem-Solving Therapy using techniques such as psychoeducation, modeling, and role-playing to teach client problem-solving skills (i.e., defining a problem specifically, generating possible solutions, evaluating the pros and cons of each solution, selecting and implementing a plan of action, evaluating the efficacy of the plan, accepting or revising the plan); role-play application of the problem-solving skill to a real life issue. Encourage in the client the development of a positive problem orientation in which problems and solving them are viewed as a natural part of life and not something to be feared, despaired, or avoided. 3. Develop healthy interpersonal relationships that lead to the alleviation and help prevent the relapse of depression. 4. Develop healthy thinking patterns and beliefs about self, others, and the world that lead to the alleviation and help prevent the relapse of depression. 5. Recognize, accept, and cope with feelings of depression. Diagnosis F33.1  Conditions For  Discharge Achievement of treatment goals and objectives   Salomon Fick, LCSW

## 2023-06-07 ENCOUNTER — Other Ambulatory Visit: Payer: Self-pay | Admitting: Family Medicine

## 2023-06-20 DIAGNOSIS — K080 Exfoliation of teeth due to systemic causes: Secondary | ICD-10-CM | POA: Diagnosis not present

## 2023-06-26 DIAGNOSIS — J3801 Paralysis of vocal cords and larynx, unilateral: Secondary | ICD-10-CM | POA: Diagnosis not present

## 2023-06-26 DIAGNOSIS — A310 Pulmonary mycobacterial infection: Secondary | ICD-10-CM | POA: Diagnosis not present

## 2023-06-26 DIAGNOSIS — C37 Malignant neoplasm of thymus: Secondary | ICD-10-CM | POA: Diagnosis not present

## 2023-06-26 DIAGNOSIS — R053 Chronic cough: Secondary | ICD-10-CM | POA: Diagnosis not present

## 2023-07-03 ENCOUNTER — Inpatient Hospital Stay: Payer: Medicare Other | Attending: Internal Medicine

## 2023-07-03 ENCOUNTER — Ambulatory Visit (HOSPITAL_COMMUNITY)
Admission: RE | Admit: 2023-07-03 | Discharge: 2023-07-03 | Disposition: A | Discharge status: Home / Self Care | Payer: Medicare Other | Source: Ambulatory Visit | Attending: Internal Medicine | Admitting: Internal Medicine

## 2023-07-03 DIAGNOSIS — Z85238 Personal history of other malignant neoplasm of thymus: Secondary | ICD-10-CM | POA: Diagnosis not present

## 2023-07-03 DIAGNOSIS — Z79899 Other long term (current) drug therapy: Secondary | ICD-10-CM | POA: Diagnosis not present

## 2023-07-03 DIAGNOSIS — J439 Emphysema, unspecified: Secondary | ICD-10-CM | POA: Diagnosis not present

## 2023-07-03 DIAGNOSIS — J9859 Other diseases of mediastinum, not elsewhere classified: Secondary | ICD-10-CM | POA: Diagnosis not present

## 2023-07-03 DIAGNOSIS — C37 Malignant neoplasm of thymus: Secondary | ICD-10-CM

## 2023-07-03 LAB — CMP (CANCER CENTER ONLY)
ALT: 31 U/L (ref 0–44)
AST: 33 U/L (ref 15–41)
Albumin: 3.7 g/dL (ref 3.5–5.0)
Alkaline Phosphatase: 68 U/L (ref 38–126)
Anion gap: 4 — ABNORMAL LOW (ref 5–15)
BUN: 26 mg/dL — ABNORMAL HIGH (ref 8–23)
CO2: 33 mmol/L — ABNORMAL HIGH (ref 22–32)
Calcium: 9.8 mg/dL (ref 8.9–10.3)
Chloride: 105 mmol/L (ref 98–111)
Creatinine: 1.72 mg/dL — ABNORMAL HIGH (ref 0.44–1.00)
GFR, Estimated: 30 mL/min — ABNORMAL LOW (ref >60)
Glucose, Bld: 91 mg/dL (ref 70–99)
Potassium: 4.2 mmol/L (ref 3.5–5.1)
Sodium: 142 mmol/L (ref 135–145)
Total Bilirubin: 0.4 mg/dL (ref <1.2)
Total Protein: 6.1 g/dL — ABNORMAL LOW (ref 6.5–8.1)

## 2023-07-03 LAB — CBC WITH DIFFERENTIAL (CANCER CENTER ONLY)
Abs Immature Granulocytes: 0.01 10*3/uL (ref 0.00–0.07)
Basophils Absolute: 0 10*3/uL (ref 0.0–0.1)
Basophils Relative: 1 %
Eosinophils Absolute: 0.2 10*3/uL (ref 0.0–0.5)
Eosinophils Relative: 4 %
HCT: 36.3 % (ref 36.0–46.0)
Hemoglobin: 11.6 g/dL — ABNORMAL LOW (ref 12.0–15.0)
Immature Granulocytes: 0 %
Lymphocytes Relative: 19 %
Lymphs Abs: 0.9 10*3/uL (ref 0.7–4.0)
MCH: 31.3 pg (ref 26.0–34.0)
MCHC: 32 g/dL (ref 30.0–36.0)
MCV: 97.8 fL (ref 80.0–100.0)
Monocytes Absolute: 0.5 10*3/uL (ref 0.1–1.0)
Monocytes Relative: 9 %
Neutro Abs: 3.2 10*3/uL (ref 1.7–7.7)
Neutrophils Relative %: 67 %
Platelet Count: 177 10*3/uL (ref 150–400)
RBC: 3.71 MIL/uL — ABNORMAL LOW (ref 3.87–5.11)
RDW: 13.4 % (ref 11.5–15.5)
WBC Count: 4.8 10*3/uL (ref 4.0–10.5)
nRBC: 0 % (ref 0.0–0.2)

## 2023-07-04 ENCOUNTER — Ambulatory Visit (INDEPENDENT_AMBULATORY_CARE_PROVIDER_SITE_OTHER): Payer: Medicare Other | Admitting: Psychology

## 2023-07-04 DIAGNOSIS — F331 Major depressive disorder, recurrent, moderate: Secondary | ICD-10-CM

## 2023-07-04 NOTE — Progress Notes (Signed)
[facility] Counselor/Therapist Progress Note  Patient ID: Sandra Craig, MRN: [id number],    Date: 07/04/2023  Time Spent: 2:00pm-2:45pm    45 minutes   Treatment Type: Individual Therapy  Reported Symptoms: stress  Mental Status Exam: Appearance:  Casual     Behavior: Appropriate  Motor: Normal  Speech/Language:  Normal Rate  Affect: Appropriate  Mood: normal  Thought process: normal  Thought content:   WNL  Sensory/Perceptual disturbances:   WNL  Orientation: oriented to person, place, time/date, and situation  Attention: Good  Concentration: Good  Memory: WNL  Fund of knowledge:  Good  Insight:   Good  Judgment:  Good  Impulse Control: Good   Risk Assessment: Danger to Self:  No Self-injurious Behavior: No Danger to Others: No Duty to Warn:no Physical Aggression / Violence:No  Access to Firearms a concern: No  Gang Involvement:No   Subjective: Pt present for face-to-face individual therapy via video.  Pt consents to telehealth video session and is aware of limitations and benefits of virtual sessions. Location of pt: home Location of therapist: home office.   Pt talked about Thanksgiving.  She had 12 house guests for the whole weekend.  It went well but was overwhelming for pt.  Pt talked about her husband Sandra Craig.   He has surgery next week for a lung biopsy.  After they get the results a treatment plan will be determined. Sandra Craig hopes to travel to [place] the end of December.   Pt is not as comfortable with that trip bc they will mostly be with a group of Sandra Craig's friends. Worked on self care strategies.   Addressed how pt can increase contact with friends.   Worked on present moments mindfulness since pt tends to get anticipatory anxiety about things.  Provided supportive therapy.   Interventions: Cognitive Behavioral Therapy and Insight-Oriented  Diagnosis: F33.1  Plan of Care: Recommend ongoing therapy.   Pt participated in setting  treatment goals.  Pt wants to have someone to talk to and to improve coping skills.  Pt wants to feel less anxious and depressed.  Plan to meet every two weeks.    Treatment Plan (Treatment Plan Target Date: 10/27/2023) Client Abilities/Strengths  Pt is bright, engaging, and motivated for therapy.   Client Treatment Preferences  Individual therapy.  Client Statement of Needs  Improve coping skills.  Symptoms  Depressed or irritable mood. Diminished interest in or enjoyment of activities. Lack of energy. Feelings of hopelessness, worthlessness, or inappropriate guilt. Unresolved grief issues.   Problems Addressed  Unipolar Depression Goals 1. Alleviate depressive symptoms and return to previous level of effective functioning. 2. Appropriately grieve the loss in order to normalize mood and to return to previously adaptive level of functioning. Objective Learn and implement behavioral strategies to overcome depression. Target Date: 2023-10-27 Frequency: Biweekly  Progress: 30 Modality: individual  Related Interventions Engage the client in "behavioral activation," increasing his/her activity level and contact with sources of reward, while identifying processes that inhibit activation.  Use behavioral techniques such as instruction, rehearsal, role-playing, role reversal, as needed, to facilitate activity in the client's daily life; reinforce success. Assist the client in developing skills that increase the likelihood of deriving pleasure from behavioral activation (e.g., assertiveness skills, developing an exercise plan, less internal/more external focus, increased social involvement); reinforce success. Objective Identify important people in life, past and present, and describe the quality, good and poor, of those relationships. Target Date: 2023-10-27 Frequency: Biweekly  Progress: 30 Modality: individual  Related  Interventions Conduct Interpersonal Therapy beginning with the  assessment of the client's "interpersonal inventory" of important past and present relationships; develop a case formulation linking depression to grief, interpersonal role disputes, role transitions, and/or interpersonal deficits). Objective Learn and implement problem-solving and decision-making skills. Target Date: 2023-10-27 Frequency: Biweekly  Progress: 30 Modality: individual  Related Interventions Conduct Problem-Solving Therapy using techniques such as psychoeducation, modeling, and role-playing to teach client problem-solving skills (i.e., defining a problem specifically, generating possible solutions, evaluating the pros and cons of each solution, selecting and implementing a plan of action, evaluating the efficacy of the plan, accepting or revising the plan); role-play application of the problem-solving skill to a real life issue. Encourage in the client the development of a positive problem orientation in which problems and solving them are viewed as a natural part of life and not something to be feared, despaired, or avoided. 3. Develop healthy interpersonal relationships that lead to the alleviation and help prevent the relapse of depression. 4. Develop healthy thinking patterns and beliefs about self, others, and the world that lead to the alleviation and help prevent the relapse of depression. 5. Recognize, accept, and cope with feelings of depression. Diagnosis F33.1  Conditions For Discharge Achievement of treatment goals and objectives   Sandra Fick, LCSW

## 2023-07-07 ENCOUNTER — Encounter (HOSPITAL_BASED_OUTPATIENT_CLINIC_OR_DEPARTMENT_OTHER): Payer: Self-pay | Admitting: Family Medicine

## 2023-07-09 ENCOUNTER — Other Ambulatory Visit: Payer: Self-pay | Admitting: Family Medicine

## 2023-07-10 ENCOUNTER — Inpatient Hospital Stay: Payer: Medicare Other | Admitting: Internal Medicine

## 2023-07-10 VITALS — BP 150/90 | HR 98 | Temp 98.0°F | Resp 17 | Ht 64.0 in | Wt 141.8 lb

## 2023-07-10 DIAGNOSIS — C3490 Malignant neoplasm of unspecified part of unspecified bronchus or lung: Secondary | ICD-10-CM | POA: Diagnosis not present

## 2023-07-10 DIAGNOSIS — Z79899 Other long term (current) drug therapy: Secondary | ICD-10-CM | POA: Diagnosis not present

## 2023-07-10 DIAGNOSIS — Z85238 Personal history of other malignant neoplasm of thymus: Secondary | ICD-10-CM | POA: Diagnosis not present

## 2023-07-10 NOTE — Progress Notes (Signed)
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    PRIOR THERAPY:  1) status post surgical resection of the anterior mediastinal lymph node under the care of Dr. Dorris Fetch on 08/05/22 2) Adjuvant radiation under the care of Dr. Mitzi Hansen.   CURRENT THERAPY: Observation.    INTERVAL HISTORY: Sandra Craig 82 y.o. female returns to the clinic today for follow-up visit.Discussed the use of AI scribe software for clinical note transcription with the patient, who gave verbal consent to proceed.  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 This was followed by adjuvant radiation due to close margins. Since then, the patient has been under regular surveillance with periodic scans.  The patient reports occasional incisional pain, described as a sharp twinge, at the site of the laparoscopic procedure. However, this is not a significant concern for her. She has also been experiencing a productive cough with yellow sputum, which she has difficulty expectorating. She has been under the care of a pulmonologist for this issue.  The patient's recent CT scan of the chest showed hazy areas suggestive of atypical infection but no evidence of recurrence. She has been experiencing occasional swelling in one of her legs. Despite these issues, the patient's overall condition appears to be stable.        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   Chronic insomnia    Chronic kidney disease    Cystitis    Depression    Diverticulitis of intestine without perforation or abscess without bleeding    Patient did have abscess but noperforation   Diverticulosis of colon (without mention of hemorrhage)    Endometriosis    Family history of malignant neoplasm of gastrointestinal tract    Fibromyalgia    Gastritis    Hiatal hernia    HIATAL HERNIA 10/08/2008   Qualifier: Diagnosis of  By: Koleen Distance CMA Duncan Dull), Hulan Saas     History of gallstones    HSV-1 infection    Hyperlipidemia    Hypertension    IBS (irritable bowel syndrome)    IC (interstitial cystitis)    Internal hemorrhoid    Mycobacterium avium complex (HCC)    history- took antibiotics and completed course   Osteonecrosis (HCC)    Osteopenia    Osteoporosis    Palpitations    Polycystic kidney disease    pt denies   Pulmonary nodule 07/2007   5 mm Anterior RUL   Small bowel obstruction (HCC)    Stroke (HCC) 2021   TIA    ALLERGIES:  is allergic to azithromycin and celecoxib.  MEDICATIONS:  Current Outpatient Medications  Medication Sig Dispense Refill   aspirin 81 MG tablet Take 1 tablet (81 mg total) by mouth daily. 30 tablet    atorvastatin (LIPITOR) 40 MG tablet TAKE 1 TABLET(40 MG) BY MOUTH DAILY 90 tablet 3   Calcium Citrate-Vitamin D (  CALCIUM + D PO) Take 1 tablet by mouth 2 (two) times daily.     DULoxetine (CYMBALTA) 30 MG capsule TAKE 2 CAPSULE BY MOUTH EVERY MORNING AND 1 CAPSULE BY MOUTH EVERY EVENING 270 capsule 3   famotidine (PEPCID) 20 MG tablet TAKE 1 TABLET(20 MG) BY MOUTH TWICE DAILY 180 tablet 1   gabapentin (NEURONTIN) 300 MG capsule TAKE 1 CAPSULE(300 MG) BY MOUTH TWICE DAILY AS NEEDED FOR PAIN 180 capsule 3   losartan (COZAAR) 25 MG tablet Take 12.5 mg by mouth daily.     Multiple Vitamins-Minerals (MULTIVITAMIN WITH MINERALS) tablet Take 1 tablet by mouth daily.      Polyethyl Glycol-Propyl Glycol (SYSTANE) 0.4-0.3 % GEL ophthalmic gel Place 1 application into both eyes 3 (three) times daily as needed (dry eye).     sucralfate (CARAFATE) 1 g tablet Take 1 tablet (1 g total) by mouth 4 (four) times daily. Dissolve each tablet in 15 cc water before use. 120 tablet 2   zolpidem (AMBIEN) 5 MG tablet TAKE 1 TABLET BY MOUTH EVERY DAY AT BEDTIME FOR SLEEP 90 tablet 1   No current facility-administered medications for this visit.    SURGICAL HISTORY:  Past Surgical History:  Procedure Laterality Date   ABDOMINAL HYSTERECTOMY  1994   TAH,BSO FOR ENDOMETRIOSIS   ABDOMINAL SURGERY  2011   small intestine blockage   CHOLECYSTECTOMY     GASTROPLASTY  2011   small bowel resection -open   INGUINAL HERNIA REPAIR Right 02/28/2020   Procedure: LAPAROSCOPIC RIGHT INGUINAL HERNIA REPAIR WITH MESH;  Surgeon: Axel Filler, MD;  Location: [facility] OR;  Service: General;  Laterality: Right;   JOINT REPLACEMENT  2008   OOPHORECTOMY  1994   TAH,BSO   PELVIC LAPAROSCOPY     S/P right shoulder rotater cuff  200216/2011   Tear/adhesive capsulitis   SBO Lap  11   Adhesions and small internal hernia   SHOULDER HEMI-ARTHROPLASTY  06/05/2012   Procedure: SHOULDER HEMI-ARTHROPLASTY;  Surgeon: Eulas Post, MD;  Location: [facility] OR;  Service: Orthopedics;  Laterality: Right;  FOR ARTHRITIS   TOTAL HIP ARTHROPLASTY  FALL OF 2008   rt. partial hip replacement   TOTAL SHOULDER ARTHROPLASTY  06/05/2012   Procedure: TOTAL SHOULDER ARTHROPLASTY;  Surgeon: Eulas Post, MD;  Location: [facility] OR;  Service: Orthopedics;  Laterality: Right;  RIGHT SHOULDER TOTAL ARTHROPLASTY, HEMIARTHROPLASTY, SHOULDER, FOR ARTHRITIS   VIDEO BRONCHOSCOPY Bilateral 01/28/2015   Procedure: VIDEO BRONCHOSCOPY WITH FLUORO;  Surgeon: Leslye Peer, MD;  Location: [facility];  Service: Cardiopulmonary;  Laterality: Bilateral;   VIDEO BRONCHOSCOPY Bilateral 04/23/2019   Procedure: VIDEO BRONCHOSCOPY WITHOUT FLUORO;   Surgeon: Leslye Peer, MD;  Location: [facility] ENDOSCOPY;  Service: Cardiopulmonary;  Laterality: Bilateral;    REVIEW OF SYSTEMS:  A comprehensive review of systems was negative except for: Respiratory: positive for cough, pleurisy/chest pain, and sputum   PHYSICAL EXAMINATION: General appearance: alert, cooperative, and no distress Head: Normocephalic, without obvious abnormality, atraumatic Neck: no adenopathy, no JVD, supple, symmetrical, trachea midline, and thyroid not enlarged, symmetric, no tenderness/mass/nodules Lymph nodes: Cervical, supraclavicular, and axillary nodes normal. Resp: clear to auscultation bilaterally Back: symmetric, no curvature. ROM normal. No CVA tenderness. Cardio: regular rate and rhythm, S1, S2 normal, no murmur, click, rub or gallop GI: soft, non-tender; bowel sounds normal; no masses,  no organomegaly Extremities: extremities normal, atraumatic, no cyanosis or edema  ECOG PERFORMANCE STATUS: 1 - Symptomatic but completely ambulatory  Blood pressure (!) 150/90, pulse 98, temperature  98 F (36.7 C), temperature source Temporal, resp. rate 17, height 5\' 4"  (1.626 m), weight 141 lb 12.8 oz (64.3 kg), last menstrual period 08/25/1992, SpO2 96%.  LABORATORY DATA: Lab Results  Component Value Date   WBC 4.8 07/03/2023   HGB 11.6 (L) 07/03/2023   HCT 36.3 07/03/2023   MCV 97.8 07/03/2023   PLT 177 07/03/2023      Chemistry      Component Value Date/Time   NA 142 07/03/2023 1007   NA 140 02/10/2022 0000   K 4.2 07/03/2023 1007   CL 105 07/03/2023 1007   CO2 33 (H) 07/03/2023 1007   BUN 26 (H) 07/03/2023 1007   BUN 24 (A) 02/10/2022 0000   CREATININE 1.72 (H) 07/03/2023 1007   CREATININE 1.61 (H) 07/21/2020 1559   GLU 90 02/10/2022 0000      Component Value Date/Time   CALCIUM 9.8 07/03/2023 1007   CALCIUM 10.3 12/21/2009 2250   ALKPHOS 68 07/03/2023 1007   AST 33 07/03/2023 1007   ALT 31 07/03/2023 1007   BILITOT 0.4 07/03/2023 1007        RADIOGRAPHIC STUDIES: CT Chest Wo Contrast  Result Date: 07/07/2023 CLINICAL DATA:  Thymic cancer follow-up, status post surgery and XRT EXAM: CT CHEST WITHOUT CONTRAST TECHNIQUE: Multidetector CT imaging of the chest was performed following the standard protocol without IV contrast. RADIATION DOSE REDUCTION: This exam was performed according to the departmental dose-optimization program which includes automated exposure control, adjustment of the mA and/or kV according to patient size and/or use of iterative reconstruction technique. COMPARISON:  12/30/2022 FINDINGS: Cardiovascular: The heart is normal in size. No pericardial effusion. No evidence of thoracic aneurysm. Atherosclerotic calcifications of the aortic arch. Mild coronary atherosclerosis of the LAD. Mediastinum/Nodes: Postsurgical changes related to prior anterior mediastinal mass resection. Fluid density lesions in the right prevascular region (series 2/image 7) and right paratracheal space (series 2/image 56), progressive, although favoring fluid within a pericardial recess. No suspicious mediastinal lymphadenopathy. Visualized thyroid is unremarkable. Lungs/Pleura: Radiation changes in the medial right upper lobe/paramediastinal regions bilaterally. Peribronchovascular nodularity in the right upper lobe, largely improved although mildly progressive posteriorly (series 6/image 62). Progressive peribronchovascular nodularity in the left upper lobe, measuring up to 12 mm anteriorly (series 6/image 69). However, a dominant 12 mm nodular opacity posteriorly (series 6/image 84) previously measured 16 mm. 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 Mild centrilobular emphysematous changes, upper lung predominant. No pleural effusion or pneumothorax. Upper Abdomen: Visualized upper abdomen is notable for prior cholecystectomy, vascular calcifications, and  a dominant 3.7 cm simple cyst in the anterior left kidney (series 2/image 161), benign (Bosniak I). No follow-up is recommended. Musculoskeletal: Visualized osseous structures are within normal limits. IMPRESSION: Postsurgical and radiation changes, as above. No findings suspicious for recurrent or metastatic disease. Waxing/waning peribronchovascular nodularity in the upper lobes, as above. 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 Aortic Atherosclerosis (ICD10-I70.0) and Emphysema (ICD10-J43.9). Electronically Signed   By: Charline Bills M.D.   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  She is status post surgical resection of the anterior mediastinal lymph node under the care of Dr. Dorris Fetch on 08/05/22.  She  also underwent adjuvant radiotherapy under the care of Dr. Mitzi Hansen completed in February 2024. She is currently on observation and feeling fine. She had repeat CT scan of the chest performed recently.  I personally and independently reviewed the scan images and discussed the result with the patient today.  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    Thymic Carcinoma or Stage III Squamous Cell Carcinoma Diagnosed in January 2024. Underwent surgical resection of a lymph node in the left chest followed by adjuvant radiation due to close margins. No evidence of recurrence on recent CT scan. Reports incisional pain and occasional  sharp twinges at the laparoscopic surgery site. No respiratory symptoms other than a productive cough with yellow sputum, being followed by Dr. Kendrick Fries for suspected MAC. - CT scans every six months - Schedule next scan and lab work one week before the next visit  Atypical Mycobacterial Infection (MAC) Reports a productive cough with yellow sputum. Followed by Dr. Kendrick Fries for hazy lung areas suggestive of atypical infection. Recent CT scan shows inflammation but no concerning findings. Last saw Dr. Kendrick Fries two weeks ago, awaiting further follow-up. - Continue follow-up with Dr. Kendrick Fries for lung condition monitoring  Follow-up - Follow-up in six months - Ensure lab work and CT scan are done one week before the next visit.   The patient was advised to call immediately if she has any other concerning symptoms in the interval. The patient voices understanding of current disease status and treatment options and is in agreement with the current care plan.  All questions were answered. The patient knows to call the clinic with any problems, questions or concerns. We can certainly see the patient much sooner if necessary.  The total time spent in the appointment was 20 minutes.  Disclaimer: This note was dictated with voice recognition software. Similar sounding words can inadvertently be transcribed and may not be corrected upon review.

## 2023-07-12 ENCOUNTER — Telehealth: Payer: Self-pay | Admitting: Internal Medicine

## 2023-07-12 NOTE — Telephone Encounter (Signed)
Left patient a message in regards to scheduled appointment times/dates; left callback number if needed for scheduling

## 2023-07-20 DIAGNOSIS — C37 Malignant neoplasm of thymus: Secondary | ICD-10-CM | POA: Diagnosis not present

## 2023-07-20 DIAGNOSIS — A310 Pulmonary mycobacterial infection: Secondary | ICD-10-CM | POA: Diagnosis not present

## 2023-07-20 DIAGNOSIS — J471 Bronchiectasis with (acute) exacerbation: Secondary | ICD-10-CM | POA: Diagnosis not present

## 2023-07-24 ENCOUNTER — Ambulatory Visit: Payer: Medicare Other | Admitting: Thoracic Surgery (Cardiothoracic Vascular Surgery)

## 2023-07-27 DIAGNOSIS — J471 Bronchiectasis with (acute) exacerbation: Secondary | ICD-10-CM | POA: Diagnosis not present

## 2023-08-08 ENCOUNTER — Ambulatory Visit: Payer: Medicare Other | Admitting: Psychology

## 2023-08-08 DIAGNOSIS — F331 Major depressive disorder, recurrent, moderate: Secondary | ICD-10-CM | POA: Diagnosis not present

## 2023-08-08 NOTE — Progress Notes (Signed)
 [facility] Counselor/Therapist Progress Note  Patient ID: MAYSEL MCCOLM, MRN: [id number],    Date: 08/08/2023  Time Spent: 2:00pm-2:50pm    50 minutes   Treatment Type: Individual Therapy  Reported Symptoms: stress  Mental Status Exam: Appearance:  Casual     Behavior: Appropriate  Motor: Normal  Speech/Language:  Normal Rate  Affect: Appropriate  Mood: normal  Thought process: normal  Thought content:   WNL  Sensory/Perceptual disturbances:   WNL  Orientation: oriented to person, place, time/date, and situation  Attention: Good  Concentration: Good  Memory: WNL  Fund of knowledge:  Good  Insight:   Good  Judgment:  Good  Impulse Control: Good   Risk Assessment: Danger to Self:  No Self-injurious Behavior: No Danger to Others: No Duty to Warn:no Physical Aggression / Violence:No  Access to Firearms a concern: No  Gang Involvement:No   Subjective: Pt present for face-to-face individual therapy via video.  Pt consents to telehealth video session and is aware of limitations and benefits of virtual sessions. Location of pt: home Location of therapist: home office.   Pt talked about the holidays.   [name]'s family had covid at Christmas so pt and Charlie were alone together.   They were able to be with family for New Years.   Pt and Charlie are planning to travel to [place]  this Sunday.   Pt is apprehensive about it bc [name]'s health is so fragile.   [name] will get his cancer treatments when he is in [place] .   [name] got his treatment plan for cancer.  He will get infusions every three weeks for 3 months.   Pt feels anxious about the trip.   She is having trouble doing what she needs to do to get packed and ready.   Pt feels overwhelmed. Helped pt break down the tasks into achievable parts.  Pt talked about feeling insecure and inadequate  around [name]'s friends bc they are college educated and she is not.   Addressed pt's insecurities.   Worked on  positive and affirming self talk.   Worked on self care strategies.   Worked on present moments mindfulness since pt tends to get anticipatory anxiety about things.  Provided supportive therapy.   Interventions: Cognitive Behavioral Therapy and Insight-Oriented  Diagnosis: F33.1  Plan of Care: Recommend ongoing therapy.   Pt participated in setting treatment goals.  Pt wants to have someone to talk to and to improve coping skills.  Pt wants to feel less anxious and depressed.  Plan to meet every two weeks.    Treatment Plan (Treatment Plan Target Date: 10/27/2023) Client Abilities/Strengths  Pt is bright, engaging, and motivated for therapy.   Client Treatment Preferences  Individual therapy.  Client Statement of Needs  Improve coping skills.  Symptoms  Depressed or irritable mood. Diminished interest in or enjoyment of activities. Lack of energy. Feelings of hopelessness, worthlessness, or inappropriate guilt. Unresolved grief issues.   Problems Addressed  Unipolar Depression Goals 1. Alleviate depressive symptoms and return to previous level of effective functioning. 2. Appropriately grieve the loss in order to normalize mood and to return to previously adaptive level of functioning. Objective Learn and implement behavioral strategies to overcome depression. Target Date: 2023-10-27 Frequency: Biweekly  Progress: 30 Modality: individual  Related Interventions Engage the client in behavioral activation, increasing his/her activity level and contact with sources of reward, while identifying processes that inhibit activation.  Use behavioral techniques such as instruction, rehearsal, role-playing, role reversal, as needed,  to facilitate activity in the client's daily life; reinforce success. Assist the client in developing skills that increase the likelihood of deriving pleasure from behavioral activation (e.g., assertiveness skills, developing an exercise plan, less  internal/more external focus, increased social involvement); reinforce success. Objective Identify important people in life, past and present, and describe the quality, good and poor, of those relationships. Target Date: 2023-10-27 Frequency: Biweekly  Progress: 30 Modality: individual  Related Interventions Conduct Interpersonal Therapy beginning with the assessment of the client's interpersonal inventory of important past and present relationships; develop a case formulation linking depression to grief, interpersonal role disputes, role transitions, and/or interpersonal deficits). Objective Learn and implement problem-solving and decision-making skills. Target Date: 2023-10-27 Frequency: Biweekly  Progress: 30 Modality: individual  Related Interventions Conduct Problem-Solving Therapy using techniques such as psychoeducation, modeling, and role-playing to teach client problem-solving skills (i.e., defining a problem specifically, generating possible solutions, evaluating the pros and cons of each solution, selecting and implementing a plan of action, evaluating the efficacy of the plan, accepting or revising the plan); role-play application of the problem-solving skill to a real life issue. Encourage in the client the development of a positive problem orientation in which problems and solving them are viewed as a natural part of life and not something to be feared, despaired, or avoided. 3. Develop healthy interpersonal relationships that lead to the alleviation and help prevent the relapse of depression. 4. Develop healthy thinking patterns and beliefs about self, others, and the world that lead to the alleviation and help prevent the relapse of depression. 5. Recognize, accept, and cope with feelings of depression. Diagnosis F33.1  Conditions For Discharge Achievement of treatment goals and objectives   [name], LCSW

## 2023-08-14 DIAGNOSIS — B308 Other viral conjunctivitis: Secondary | ICD-10-CM | POA: Diagnosis not present

## 2023-08-14 DIAGNOSIS — L0202 Furuncle of face: Secondary | ICD-10-CM | POA: Diagnosis not present

## 2023-08-14 DIAGNOSIS — I10 Essential (primary) hypertension: Secondary | ICD-10-CM | POA: Diagnosis not present

## 2023-09-05 ENCOUNTER — Ambulatory Visit: Payer: Medicare Other | Admitting: Psychology

## 2023-09-05 DIAGNOSIS — F331 Major depressive disorder, recurrent, moderate: Secondary | ICD-10-CM

## 2023-09-05 NOTE — Progress Notes (Signed)
  Behavioral Health Counselor/Therapist Progress Note  Patient ID: Sandra Craig, MRN: [id number],    Date: 09/05/2023  Time Spent: 2:00pm-2:45pm    45 minutes   Treatment Type: Individual Therapy  Reported Symptoms: stress  Mental Status Exam: Appearance:  Casual     Behavior: Appropriate  Motor: Normal  Speech/Language:  Normal Rate  Affect: Appropriate  Mood: normal  Thought process: normal  Thought content:   WNL  Sensory/Perceptual disturbances:   WNL  Orientation: oriented to person, place, time/date, and situation  Attention: Good  Concentration: Good  Memory: WNL  Fund of knowledge:  Good  Insight:   Good  Judgment:  Good  Impulse Control: Good   Risk Assessment: Danger to Self:  No Self-injurious Behavior: No Danger to Others: No Duty to Warn:no Physical Aggression / Violence:No  Access to Firearms a concern: No  Gang Involvement:No   Subjective: Pt present for face-to-face individual therapy via video.  Pt consents to telehealth video session and is aware of limitations and benefits of virtual sessions. Location of pt: home Location of therapist: home office.   Pt talked about [name].   He is having his infusions for cancer treatment and he is struggling with side effects.   Pt feels the medical care has been good.   They have had a chance to be with friends which helps them cheer up.   Pt states she has her low moments.   She is worried about [name].  She is having to do a lot of care giving.  [name] has fallen and hurt his side.  He is very weak.   Addressed how this impacts pt.  Worked on pharmacologist. Worked on self care strategies.   Worked on present moment mindfulness.  Provided supportive therapy.   Interventions: Cognitive Behavioral Therapy and Insight-Oriented  Diagnosis: F33.1  Plan of Care: Recommend ongoing therapy.   Pt participated in setting treatment goals.  Pt wants to have someone to talk to and to improve  coping skills.  Pt wants to feel less anxious and depressed.  Plan to meet every two weeks.    Treatment Plan (Treatment Plan Target Date: 10/27/2023) Client Abilities/Strengths  Pt is bright, engaging, and motivated for therapy.   Client Treatment Preferences  Individual therapy.  Client Statement of Needs  Improve coping skills.  Symptoms  Depressed or irritable mood. Diminished interest in or enjoyment of activities. Lack of energy. Feelings of hopelessness, worthlessness, or inappropriate guilt. Unresolved grief issues.   Problems Addressed  Unipolar Depression Goals 1. Alleviate depressive symptoms and return to previous level of effective functioning. 2. Appropriately grieve the loss in order to normalize mood and to return to previously adaptive level of functioning. Objective Learn and implement behavioral strategies to overcome depression. Target Date: 2023-10-27 Frequency: Biweekly  Progress: 30 Modality: individual  Related Interventions Engage the client in behavioral activation, increasing his/her activity level and contact with sources of reward, while identifying processes that inhibit activation.  Use behavioral techniques such as instruction, rehearsal, role-playing, role reversal, as needed, to facilitate activity in the client's daily life; reinforce success. Assist the client in developing skills that increase the likelihood of deriving pleasure from behavioral activation (e.g., assertiveness skills, developing an exercise plan, less internal/more external focus, increased social involvement); reinforce success. Objective Identify important people in life, past and present, and describe the quality, good and poor, of those relationships. Target Date: 2023-10-27 Frequency: Biweekly  Progress: 30 Modality: individual  Related Interventions Conduct  Interpersonal Therapy beginning with the assessment of the client's interpersonal inventory of important past and  present relationships; develop a case formulation linking depression to grief, interpersonal role disputes, role transitions, and/or interpersonal deficits). Objective Learn and implement problem-solving and decision-making skills. Target Date: 2023-10-27 Frequency: Biweekly  Progress: 30 Modality: individual  Related Interventions Conduct Problem-Solving Therapy using techniques such as psychoeducation, modeling, and role-playing to teach client problem-solving skills (i.e., defining a problem specifically, generating possible solutions, evaluating the pros and cons of each solution, selecting and implementing a plan of action, evaluating the efficacy of the plan, accepting or revising the plan); role-play application of the problem-solving skill to a real life issue. Encourage in the client the development of a positive problem orientation in which problems and solving them are viewed as a natural part of life and not something to be feared, despaired, or avoided. 3. Develop healthy interpersonal relationships that lead to the alleviation and help prevent the relapse of depression. 4. Develop healthy thinking patterns and beliefs about self, others, and the world that lead to the alleviation and help prevent the relapse of depression. 5. Recognize, accept, and cope with feelings of depression. Diagnosis F33.1  Conditions For Discharge Achievement of treatment goals and objectives   [name], LCSW

## 2023-09-08 DIAGNOSIS — M25552 Pain in left hip: Secondary | ICD-10-CM | POA: Diagnosis not present

## 2023-09-08 DIAGNOSIS — H1031 Unspecified acute conjunctivitis, right eye: Secondary | ICD-10-CM | POA: Diagnosis not present

## 2023-09-08 DIAGNOSIS — Z6823 Body mass index (BMI) 23.0-23.9, adult: Secondary | ICD-10-CM | POA: Diagnosis not present

## 2023-09-08 DIAGNOSIS — M25562 Pain in left knee: Secondary | ICD-10-CM | POA: Diagnosis not present

## 2023-09-08 DIAGNOSIS — M79605 Pain in left leg: Secondary | ICD-10-CM | POA: Diagnosis not present

## 2023-09-25 DIAGNOSIS — M533 Sacrococcygeal disorders, not elsewhere classified: Secondary | ICD-10-CM | POA: Diagnosis not present

## 2023-10-03 ENCOUNTER — Other Ambulatory Visit: Payer: Self-pay | Admitting: Family Medicine

## 2023-10-03 ENCOUNTER — Ambulatory Visit: Payer: Medicare Other | Admitting: Psychology

## 2023-10-03 DIAGNOSIS — F331 Major depressive disorder, recurrent, moderate: Secondary | ICD-10-CM | POA: Diagnosis not present

## 2023-10-03 NOTE — Progress Notes (Signed)
 [facility] Counselor/Therapist Progress Note  Patient ID: MARYSA WESSNER, MRN: [id number],    Date: 10/03/2023  Time Spent: 2:00pm-2:45pm    45 minutes   Treatment Type: Individual Therapy  Reported Symptoms: stress  Mental Status Exam: Appearance:  Casual     Behavior: Appropriate  Motor: Normal  Speech/Language:  Normal Rate  Affect: Appropriate  Mood: normal  Thought process: normal  Thought content:   WNL  Sensory/Perceptual disturbances:   WNL  Orientation: oriented to person, place, time/date, and situation  Attention: Good  Concentration: Good  Memory: WNL  Fund of knowledge:  Good  Insight:   Good  Judgment:  Good  Impulse Control: Good   Risk Assessment: Danger to Self:  No Self-injurious Behavior: No Danger to Others: No Duty to Warn:no Physical Aggression / Violence:No  Access to Firearms a concern: No  Gang Involvement:No   Subjective: Pt present for face-to-face individual therapy via video.  Pt consents to telehealth video session and is aware of limitations and benefits of virtual sessions. Location of pt: home Location of therapist: home office.   Pt talked about [name].   He is continuing to get cancer treatments and is having difficult side effects.   He has little energy.  Pt is worried about him.   Pt is feeling overwhelmed and stressed.  She is losing hair and thinks that is stress related.   Worked on Pharmacologist. Pt wants to find things to give her some joy.  Identified that pt can go to [employer] and get together with friends and take walks.   Worked on self care strategies.   Worked on present moment mindfulness.  Provided supportive therapy.   Interventions: Cognitive Behavioral Therapy and Insight-Oriented  Diagnosis: F33.1  Plan of Care: Recommend ongoing therapy.   Pt participated in setting treatment goals.  Pt wants to have someone to talk to and to improve coping skills.  Pt wants to feel less  anxious and depressed.  Plan to meet every two weeks.    Treatment Plan (Treatment Plan Target Date: 10/27/2023) Client Abilities/Strengths  Pt is bright, engaging, and motivated for therapy.   Client Treatment Preferences  Individual therapy.  Client Statement of Needs  Improve coping skills.  Symptoms  Depressed or irritable mood. Diminished interest in or enjoyment of activities. Lack of energy. Feelings of hopelessness, worthlessness, or inappropriate guilt. Unresolved grief issues.   Problems Addressed  Unipolar Depression Goals 1. Alleviate depressive symptoms and return to previous level of effective functioning. 2. Appropriately grieve the loss in order to normalize mood and to return to previously adaptive level of functioning. Objective Learn and implement behavioral strategies to overcome depression. Target Date: 2023-10-27 Frequency: Biweekly  Progress: 30 Modality: individual  Related Interventions Engage the client in "behavioral activation," increasing his/her activity level and contact with sources of reward, while identifying processes that inhibit activation.  Use behavioral techniques such as instruction, rehearsal, role-playing, role reversal, as needed, to facilitate activity in the client's daily life; reinforce success. Assist the client in developing skills that increase the likelihood of deriving pleasure from behavioral activation (e.g., assertiveness skills, developing an exercise plan, less internal/more external focus, increased social involvement); reinforce success. Objective Identify important people in life, past and present, and describe the quality, good and poor, of those relationships. Target Date: 2023-10-27 Frequency: Biweekly  Progress: 30 Modality: individual  Related Interventions Conduct Interpersonal Therapy beginning with the assessment of the client's "interpersonal inventory" of important past  and present relationships; develop a case  formulation linking depression to grief, interpersonal role disputes, role transitions, and/or interpersonal deficits). Objective Learn and implement problem-solving and decision-making skills. Target Date: 2023-10-27 Frequency: Biweekly  Progress: 30 Modality: individual  Related Interventions Conduct Problem-Solving Therapy using techniques such as psychoeducation, modeling, and role-playing to teach client problem-solving skills (i.e., defining a problem specifically, generating possible solutions, evaluating the pros and cons of each solution, selecting and implementing a plan of action, evaluating the efficacy of the plan, accepting or revising the plan); role-play application of the problem-solving skill to a real life issue. Encourage in the client the development of a positive problem orientation in which problems and solving them are viewed as a natural part of life and not something to be feared, despaired, or avoided. 3. Develop healthy interpersonal relationships that lead to the alleviation and help prevent the relapse of depression. 4. Develop healthy thinking patterns and beliefs about self, others, and the world that lead to the alleviation and help prevent the relapse of depression. 5. Recognize, accept, and cope with feelings of depression. Diagnosis F33.1  Conditions For Discharge Achievement of treatment goals and objectives   Salomon Fick, LCSW

## 2023-10-09 ENCOUNTER — Telehealth: Payer: Self-pay | Admitting: Medical Oncology

## 2023-10-09 ENCOUNTER — Encounter: Payer: Self-pay | Admitting: Family Medicine

## 2023-10-09 ENCOUNTER — Ambulatory Visit: Admitting: Family Medicine

## 2023-10-09 VITALS — BP 122/82 | HR 82 | Temp 97.2°F | Ht 64.0 in | Wt 143.2 lb

## 2023-10-09 DIAGNOSIS — A310 Pulmonary mycobacterial infection: Secondary | ICD-10-CM

## 2023-10-09 DIAGNOSIS — N1832 Chronic kidney disease, stage 3b: Secondary | ICD-10-CM | POA: Diagnosis not present

## 2023-10-09 DIAGNOSIS — M797 Fibromyalgia: Secondary | ICD-10-CM | POA: Diagnosis not present

## 2023-10-09 DIAGNOSIS — I10 Essential (primary) hypertension: Secondary | ICD-10-CM | POA: Diagnosis not present

## 2023-10-09 DIAGNOSIS — Z23 Encounter for immunization: Secondary | ICD-10-CM | POA: Diagnosis not present

## 2023-10-09 DIAGNOSIS — F3342 Major depressive disorder, recurrent, in full remission: Secondary | ICD-10-CM | POA: Diagnosis not present

## 2023-10-09 NOTE — Addendum Note (Signed)
 Addended by: Samara Deist on: 10/09/2023 04:26 PM   Modules accepted: Orders

## 2023-10-09 NOTE — Progress Notes (Signed)
 Phone [phone] In person visit   Subjective:   Sandra Craig is a 83 y.o. year old very pleasant female patient who presents for/with See problem oriented charting Chief Complaint  Patient presents with   hair loss   Incisional Pain    Pt c/o still having incisional pain.   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    Priority: High   Chronic low back pain 12/20/2013    Priority: High   Fibromyalgia 12/05/2007    Priority: High   UTI (urinary tract infection) 12/15/2018    Priority: Medium    Chronic kidney disease, stage 3b (HCC) 06/02/2017    Priority: Medium    Near syncope 03/11/2017    Priority: Medium    Hyperglycemia 06/27/2012    Priority: Medium    History of small bowel obstruction 06/28/2011    Priority: Medium    GERD with stricture 06/28/2011    Priority: Medium    Hypertension     Priority: Medium    Hyperlipidemia     Priority: Medium    INSOMNIA, CHRONIC 10/08/2008    Priority: Medium    Depression 02/08/2008    Priority: Medium    Facet arthropathy 09/14/2019    Priority: Low   Vaginal dryness 01/29/2019    Priority: Low   Chronic right shoulder pain 03/07/2014    Priority: Low   Benign essential tremor 10/09/2013    Priority: Low   Diverticulitis large intestine 02/11/2012    Priority: Low   Irritable bowel syndrome (IBS) 06/28/2011    Priority: Low   IC (interstitial cystitis)     Priority: Low   Hemorrhoids 10/08/2008    Priority: Low   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 30 tablet    atorvastatin (LIPITOR) 40 MG tablet TAKE 1 TABLET(40 MG) BY MOUTH DAILY 90 tablet 3   Calcium Citrate-Vitamin D (CALCIUM + D PO) Take 1 tablet by mouth 2 (two) times daily.     diazepam (VALIUM) 5 MG tablet Take 5 mg by mouth daily.     DULoxetine (CYMBALTA) 30 MG capsule TAKE 2 CAPSULE BY MOUTH EVERY MORNING AND 1 CAPSULE BY MOUTH EVERY EVENING 270 capsule 3   famotidine (PEPCID) 20 MG tablet TAKE 1 TABLET(20 MG) BY MOUTH TWICE DAILY 180 tablet 1   gabapentin (NEURONTIN) 300 MG capsule TAKE 1 CAPSULE(300 MG) BY MOUTH TWICE DAILY AS NEEDED FOR PAIN 180 capsule 3   losartan (COZAAR) 25 MG tablet Take 12.5 mg by mouth daily.     Multiple Vitamins-Minerals (MULTIVITAMIN WITH MINERALS) tablet Take 1 tablet by mouth daily.     Polyethyl Glycol-Propyl Glycol (SYSTANE) 0.4-0.3 % GEL ophthalmic gel Place 1 application into both eyes 3 (three) times daily as needed (dry eye).     sucralfate (CARAFATE) 1  g tablet Take 1 tablet (1 g total) by mouth 4 (four) times daily. Dissolve each tablet in 15 cc water before use. 120 tablet 2   zolpidem (AMBIEN) 5 MG tablet TAKE 1 TABLET BY MOUTH EVERY DAY AT BEDTIME FOR SLEEP (Patient not taking: Reported on 10/09/2023) 90 tablet 1   No current facility-administered medications for this visit.     Objective:  BP 122/82   Pulse 82   Temp (!) 97.2 F (36.2 C)   Ht 5\' 4"  (1.626 m)   Wt 143 lb 3.2 oz (65 kg)   LMP 08/25/1992   SpO2 100%   BMI 24.58 kg/m  Gen: NAD, resting comfortably CV: RRR no murmurs rubs or gallops Lungs: CTAB no crackles, wheeze, rhonchi Ext: no edema Skin: warm, dry     Assessment and Plan   # Social update-has been still dealing with cancer that is not accessible to surgical treatment- planning on radiation  plus keytruda that he's been on. He is still working -did take trip to [place]- had shingles in her eye- was treated with valtrex- was on lower lid - later had to take prednisone through Dr. Maurice Small  #Hair loss S: lost about a third of her hair around first of January. Came out in gobs of hair. Hair was left thin. Came out at the roots. Has pretty much stopped A/P: sounds like telogen effluvium- likely recent stress related from husbands situation- doing better now. Still working with Salomon Fick -with improvement will hold off on dermatology unless worsens again- as long as better- hold off on TSH as well  # left sided "incisional pain" S:left sided chest pain that wraps around the left side  and into the back- but prssurizs most in middle of her chest. Triggered by bending over or turning over the wrong way.  Almost as bad as when broke ribs. Occurs at site of laparoscopic procedure -discontinue chest 07/03/23 showed postsurgical and radiation changes- no obvious cause of the pain.  A/P: I agree with possible incisional pain - could have deeper scar tissue or nerve that is irritated by certain positions- already on gabapentin and Cymbalta for other reasons and suspect would be worse without these so we will continue- if worsening she will let me know but has upcoming CT   #Pulmonary MAI- follow up with Dr. Lorrin Goodell [facility] - reestablished 07/20/23  # Depression/insomnia #fibromyalgia- also on gabapentin S: Medication: Cymbalta 30 mg twice daily, Ambien 5 mg causes vivid dreams at times -Failed Rozerem in the past with Dr. Artist Pais Counseling: Working with Salomon Fick, LCSW in 2023-very helpful Remains alcohol free    10/09/2023    3:04 PM 06/02/2023    2:08 PM 01/02/2023    3:35 PM  Depression screen PHQ 2/9  Decreased Interest 0 2 0  Down, Depressed, Hopeless 0 2 0  PHQ - 2 Score 0 4 0  Altered sleeping 0 1 0  Tired, decreased energy 0 2 0  Change in appetite 0 0 0  Feeling bad or  failure about yourself  0 2 0  Trouble concentrating 0 1 0  Moving slowly or fidgety/restless 0 0 0  Suicidal thoughts 0 0 0  PHQ-9 Score 0 10 0  Difficult doing work/chores Not difficult at all Very difficult Not difficult at all  A/P: depression in full remisison- continue current medications    #hypertension with orthostatic hypotension S: medication: Losartan 12.5 mg restarted by Dr. 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 Signe Colt S: GFR is typically in the 30srange- right at 30 3 months ago -Patient knows to avoid NSAIDs - other than 1 week with back issues A/P: GFR on border of stage III and IV- continue to monitor and follow up with nephrology   #hyperlipidemia/elevated LFT history #possible TIA history 06/2017- aspirin 81 mg S: Medication:atorvastatin 40 mg- taking this Lab Results  Component Value Date   CHOL 143 11/30/2022   HDL 43.90 11/30/2022   LDLCALC 68 11/30/2022   LDLDIRECT 59.0 11/03/2020   TRIG 156.0 (H) 11/30/2022   CHOLHDL 3 11/30/2022  A/P: #s have looked good- next visit will be due for labs   # Hyperglycemia/insulin resistance/prediabetes-A1c as high as 5.06 February 2021 S:  Medication: none Lab Results  Component Value Date   HGBA1C 6.0 11/30/2022   HGBA1C 6.1 06/22/2022   HGBA1C 5.8 12/07/2021  A/P: prediabetes noted but check with labs next visit to hopefully get further out from prednisone use recently  #memory concerns- not as bad lately- have considered MMSE if worsens  Recommended follow up: Return in about 4 months (around 02/08/2024) for physical or sooner if needed.Schedule b4 you leave. Future Appointments  Date  Time Provider Department Center  11/09/2023  2:00 PM Bauert, Rella Larve, [place] [facility] None  12/07/2023  2:00 PM Bauert, Rella Larve, LCSW [facility] None  01/01/2024 10:45 AM [facility] None  01/08/2024  1:00 PM [facility] ANNUAL WELLNESS VISIT 1 [facility]  01/10/2024 10:45 AM Si Gaul, MD [facility] None    Lab/Order associations:   ICD-10-CM   1. Major depressive disorder, recurrent, in full remission (HCC)  F33.42     2. Chronic kidney disease, stage 3b (HCC)  N18.32     3. Primary hypertension  I10     4. Fibromyalgia  M79.7     5. MAI (mycobacterium avium-intracellulare) infection (HCC)  A31.0       No orders of the defined types were placed in this encounter.   Return precautions advised.  Tana Conch, MD

## 2023-10-09 NOTE — Patient Instructions (Addendum)
 Prevnar 20 today  Labs next visit  Glad you are doing reasonably well  Chat with GYN about restarting Prolia- if they want to hold off and have [place] do it we can help but will need to restart process here  Recommended follow up: Return in about 3-4 months (around 02/08/2024) for physical or sooner if needed.Schedule b4 you leave.

## 2023-10-09 NOTE — Telephone Encounter (Addendum)
 Pain /heaviness mid abdomen when she bends over or turns over in bed. She feels the pain on left side front and back in the tissue under ribs. Her VATS was in jan 2024. Dr. Arbutus Ped said she needs to see PCP.

## 2023-10-12 ENCOUNTER — Encounter: Payer: Self-pay | Admitting: Podiatry

## 2023-10-12 ENCOUNTER — Ambulatory Visit: Admitting: Podiatry

## 2023-10-12 DIAGNOSIS — L600 Ingrowing nail: Secondary | ICD-10-CM | POA: Diagnosis not present

## 2023-10-12 DIAGNOSIS — B351 Tinea unguium: Secondary | ICD-10-CM | POA: Diagnosis not present

## 2023-10-12 DIAGNOSIS — M79675 Pain in left toe(s): Secondary | ICD-10-CM

## 2023-10-12 DIAGNOSIS — M79674 Pain in right toe(s): Secondary | ICD-10-CM

## 2023-10-12 NOTE — Patient Instructions (Signed)

## 2023-10-13 NOTE — Progress Notes (Signed)
 Subjective:   Patient ID: Sandra Craig, female   DOB: 83 y.o.   MRN: [id number]   HPI Patient states she has had a lot of problems with the second nail of her left foot growing abnormally and painful and all her nails are thickened and impossible for her to cut.  Patient states that she does not smoke and does like to be active   Review of Systems  All other systems reviewed and are negative.       Objective:  Physical Exam Vitals and nursing note reviewed.  Constitutional:      Appearance: She is well-developed.  Pulmonary:     Effort: Pulmonary effort is normal.  Musculoskeletal:        General: Normal range of motion.  Skin:    General: Skin is warm.  Neurological:     Mental Status: She is alert.     Neurovascular status intact muscle strength found to be adequate range of motion adequate with a severely painful deformed second nail left and all nails being thickened and dystrophic and painful with pressed.  Patient has good digital perfusion well-oriented     Assessment:  Combination of a damaged second nail left with abnormal positioning and nail disease 1-5 both feet     Plan:  H&P reviewed discussed treatment options she wants the second nail removed permanently and I did explain procedure risk she wants surgery and I went ahead and I anesthetized 60 mg like Marcaine mixture patient read and signed consent form sterile prep done using sterile instrumentation removed the second nail exposed matrix applied phenol for applications 30 seconds followed by alcohol lavage sterile dressing gave instructions on soaks wear dressing 24 hours take off earlier if throbbing were to occur and debrided nailbeds 1 through 5 right 1 and 3 through 5 left with no iatrogenic bleeding

## 2023-10-28 DIAGNOSIS — R1032 Left lower quadrant pain: Secondary | ICD-10-CM | POA: Diagnosis not present

## 2023-10-28 DIAGNOSIS — K5733 Diverticulitis of large intestine without perforation or abscess with bleeding: Secondary | ICD-10-CM | POA: Diagnosis not present

## 2023-10-30 ENCOUNTER — Encounter: Payer: Medicare Other | Admitting: Pharmacist

## 2023-10-30 ENCOUNTER — Ambulatory Visit: Payer: Self-pay

## 2023-10-30 NOTE — Telephone Encounter (Signed)
 Copied From CRM [id number]. Reason for Triage: Patient was treated in urgent care for diverticulitis attack this past Saturday, 10/28/2023. Stated that pain is gone, but still experiencing irregular bowel movements. Feels like only gas and slight diarrhea passing. Sent a message to Dr Durene Cal via MyChart regarding symptoms and next steps but has not heard back yet.    Chief Complaint: diverticulitis; was seen at [facility] on Sat.  Symptoms: gas, diarrhea Frequency: constant Pertinent Negatives: Patient denies NA; called to schedule apt . Disposition: [] ED /[] Urgent Care (no appt availability in office) / [x] Appointment(In office/virtual)/ []  [facility]/ [] Home Care/ [] Refused Recommended Disposition /[] [facility]/ []  Follow-up with PCP Additional Notes: care advice given, denies questions; instructed to go to ER if becomes worse.

## 2023-10-30 NOTE — Telephone Encounter (Signed)
 FYI

## 2023-11-01 ENCOUNTER — Encounter: Payer: Self-pay | Admitting: Family Medicine

## 2023-11-01 ENCOUNTER — Ambulatory Visit (INDEPENDENT_AMBULATORY_CARE_PROVIDER_SITE_OTHER): Admitting: Family Medicine

## 2023-11-01 VITALS — BP 148/87 | HR 85 | Temp 97.7°F | Ht 64.0 in | Wt 138.0 lb

## 2023-11-01 DIAGNOSIS — F3342 Major depressive disorder, recurrent, in full remission: Secondary | ICD-10-CM

## 2023-11-01 DIAGNOSIS — Z8719 Personal history of other diseases of the digestive system: Secondary | ICD-10-CM | POA: Diagnosis not present

## 2023-11-01 DIAGNOSIS — N1832 Chronic kidney disease, stage 3b: Secondary | ICD-10-CM

## 2023-11-01 DIAGNOSIS — E785 Hyperlipidemia, unspecified: Secondary | ICD-10-CM

## 2023-11-01 DIAGNOSIS — I10 Essential (primary) hypertension: Secondary | ICD-10-CM

## 2023-11-01 NOTE — Progress Notes (Signed)
 Phone [phone] In person visit   Subjective:   Sandra Craig is a 83 y.o. year old very pleasant female patient who presents for/with See problem oriented charting Chief Complaint  Patient presents with   Follow-up    Pt here for folloup from Diverticulitis flare     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    Priority: High   Chronic low back pain 12/20/2013    Priority: High   Fibromyalgia 12/05/2007    Priority: High   UTI (urinary tract infection) 12/15/2018    Priority: Medium    Chronic kidney disease, stage 3b (HCC) 06/02/2017    Priority: Medium    Near syncope 03/11/2017    Priority: Medium    Hyperglycemia 06/27/2012    Priority: Medium    History of small bowel obstruction 06/28/2011    Priority: Medium    GERD with stricture 06/28/2011    Priority: Medium    Hypertension     Priority: Medium    Hyperlipidemia     Priority: Medium    INSOMNIA, CHRONIC 10/08/2008    Priority: Medium    Depression 02/08/2008    Priority: Medium    Facet arthropathy 09/14/2019    Priority: Low   Vaginal dryness 01/29/2019    Priority: Low   Chronic right shoulder pain 03/07/2014    Priority: Low   Benign essential tremor 10/09/2013    Priority: Low   Diverticulitis large intestine 02/11/2012    Priority: Low   Irritable bowel syndrome (IBS) 06/28/2011    Priority: Low   IC (interstitial cystitis)     Priority: Low   Hemorrhoids 10/08/2008    Priority: Low   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 30 tablet    atorvastatin (LIPITOR) 40 MG tablet TAKE 1 TABLET(40 MG) BY MOUTH DAILY 90 tablet 3   Calcium Citrate-Vitamin D (CALCIUM + D PO) Take 1 tablet by mouth 2 (two) times daily.     diazepam (VALIUM) 5 MG tablet Take 5 mg by mouth daily.     DULoxetine (CYMBALTA) 30 MG capsule TAKE 2 CAPSULE BY MOUTH EVERY MORNING AND 1 CAPSULE BY MOUTH EVERY EVENING 270 capsule 3   famotidine (PEPCID) 20 MG tablet TAKE 1 TABLET(20 MG) BY MOUTH TWICE DAILY 180 tablet 1   gabapentin (NEURONTIN) 300 MG capsule TAKE 1 CAPSULE(300 MG) BY MOUTH TWICE DAILY AS NEEDED FOR PAIN 180 capsule 3   losartan (COZAAR) 25 MG tablet Take 12.5 mg by mouth daily.     Multiple Vitamins-Minerals (MULTIVITAMIN WITH MINERALS) tablet Take 1 tablet by mouth daily.     Polyethyl Glycol-Propyl Glycol (SYSTANE) 0.4-0.3 % GEL ophthalmic gel Place 1 application into both eyes 3 (three) times daily as needed (dry eye).     sucralfate (CARAFATE) 1 g tablet  Take 1 tablet (1 g total) by mouth 4 (four) times daily. Dissolve each tablet in 15 cc water before use. 120 tablet 2   zolpidem (AMBIEN) 5 MG tablet TAKE 1 TABLET BY MOUTH EVERY DAY AT BEDTIME FOR SLEEP 90 tablet 1   No current facility-administered medications for this visit.     Objective:  BP (!) 148/87   Pulse 85   Temp 97.7 F (36.5 C)   Ht 5\' 4"  (1.626 m)   Wt 138 lb (62.6 kg)   LMP 08/25/1992   SpO2 99%   BMI 23.69 kg/m  Gen: NAD, resting comfortably CV: RRR no murmurs rubs or gallops Lungs: CTAB no crackles, wheeze, rhonchi Abdomen: soft/nontender with repeat palpation-on initial palpation moderate pain in left lower quadrant with deep palpation/nondistended/normal bowel sounds. No rebound or guarding.  Ext: no edema Skin: warm, dry     Assessment and  Plan    #social update- considering assisted living  # diverticulitis S:patient seen at urgent care on April 29th- was having pain in left lower quadrant/pelvic pain and explosive air and mucusy discharge. She was started on Augmentin by urgent care but only took 1 dose- so much going on in her life and wanted to simply see how she did without medicine.  . She did soft diet and has noted improvement with that and has been able to gradually advance - night before last had soup, yesterday banana sandwich, with nothing so far today. At first was getting irregular bowel movement with a lot of gas still but that has finally normalized as of last night- back to normal bowel movement . No fever or chills.  - basically back to her baselie- does tend to get some pain in LLQ from time to time - once had abscess with this in 2013 and continues to concern her that it could recur.  A/P: Pain without palpation has completely resolved.  Patient reports some pain with deep palpation in left lower quadrant intermittently at baseline and she appears to be back to her baseline-with initial palpation she did note some pain but with subsequent palpation pain resolved-wonder if there is a muscular element to this. -She did not require course of Augmentin and we discussed that guidelines have changed and conservative care is often adequate to treat diverticulitis.  She did not have confirmatory imaging but we discussed if recurrent issues to have her back again and likely move - Discussed if confirmed more frequent flares in coming months for years could consider partial colectomy but she certainly wants to avoid this  #Eyelid infection- has been treated with multiple treatments outpatient by urgent cares several times and lack of improvement so has scheduled with eye doctor this week.     # Depression/insomnia #fibromyalgia- also on gabapentin S: Medication: Cymbalta 30 mg twice daily, Ambien 5 mg causes vivid dreams  at times -Failed Rozerem in the past with Dr. Artist Pais Counseling: Working with Nanda Quinton, LCSW in 2023 Stressors: Ongoing challenging marriage and stress of dealing with his next as far as her living situation is challenging A/P: Patient reports in general full remission of depression but she certainly has some situational stressors in her marriage-had a tough conversation last night and feels that could be contributing to blood pressure being elevated today - We opted to continue current medication for now  #hypertension with orthostatic hypotension S: medication: Losartan 12.5 mg restarted by Dr. Charline Bills presyncope but was having elevated microalbumin to creatinine ratio compared to prior -  Amlodipine 1.25 mg in the past but had previously can be BP Readings from Last 3 Encounters:  11/01/23 (!) 148/87  10/09/23 122/82  07/10/23 (!) 150/90  A/P: Patient's blood pressure situation tends to be rather tenuous due to orthostatic hypotension.  Blood pressure about a month ago was very well-controlled-moderately elevated today but wonders if this could be related to stress on the home front.  Send blood pressure was well-controlled last visit and only mildly elevated today we opted to hold steady on dose of medicine and encouraged her to monitor at home  #Chronic kidney disease stage IIIb and on border with 4- sees Dr. Signe Colt S: GFR is typically right around 30 A/P: Stable on most recent check-offered updated blood work especially with recent diverticulitis concern but she declines.  Has upcoming follow-up with [facility] Dr. Signe Colt and blood pressure and renal function will be checked  #hyperlipidemia/elevated LFT history #possible TIA history 06/2017- aspirin 81 mg S: Medication:atorvastatin 40 mg Lab Results  Component Value Date   CHOL 143 11/30/2022   HDL 43.90 11/30/2022   LDLCALC 68 11/30/2022   LDLDIRECT 59.0 11/03/2020   TRIG 156.0 (H) 11/30/2022   CHOLHDL 3 11/30/2022   A/P:  Lipids at goal with LDL under 70 even with TIA history.  Continue current medications including aspirin and statin.  At next visit will be due for follow-up  Recommended follow up: Return for as needed for new, worsening, persistent symptoms. Future Appointments  Date Time Provider Department Center  11/09/2023  2:00 PM Bauert, Rella Larve, [place] [facility] None  12/07/2023  2:00 PM Bauert, Rella Larve, LCSW [facility] None  01/01/2024 10:45 AM [facility] None  01/05/2024 11:00 AM Shelva Majestic, MD [facility]  01/09/2024  2:00 PM Bauert, Rella Larve, LCSW [facility] None  01/10/2024 10:45 AM Si Gaul, MD [facility] None  01/10/2024  1:40 PM [facility] ANNUAL WELLNESS VISIT 1 [facility]    Lab/Order associations: No diagnosis found.  No orders of the defined types were placed in this encounter.   Return precautions advised.  Tana Conch, MD

## 2023-11-01 NOTE — Patient Instructions (Addendum)
 Glad you are healing up- gradually advance diet  I would be supportive of assisted living just for the long run if cost effective  - not that you immediately need it  Blood pressure slightly high today- keep an eye at home and we can recheck next visit - if regularly above 150 may need to increase- also the fact it looked so good 122/82 at last visit makes me want to be cautious about increasing dose  Recommended follow up: Return for as needed for new, worsening, persistent symptoms. Otherwise will see you in june

## 2023-11-09 ENCOUNTER — Ambulatory Visit: Payer: Medicare Other | Admitting: Psychology

## 2023-11-09 DIAGNOSIS — F331 Major depressive disorder, recurrent, moderate: Secondary | ICD-10-CM | POA: Diagnosis not present

## 2023-11-09 NOTE — Progress Notes (Signed)
 [facility] Counselor Initial Adult Exam  Name: Sandra Craig Date: 11/09/2023 MRN: [id number] DOB: 1940/12/19 PCP: Shelva Majestic, MD  Time spent: 2:00pm-2:50pm   50 minutes  Guardian/Payee:  [name] requested: No   Reason for Visit /Presenting Problem: Pt present for face-to-face initial assessment update via video.  Pt consents to telehealth video session and is aware of limitations and benefits of virtual sessions.   Location of pt: home Location of therapist: home office.  Pt still has symptoms of depression and anxiety.   Pt has issues in her relationship with her husband.   Pt got married about 3 years and a half ago and is not happy in her marriage.   Pt moved from her home of 11 years to her husband's house 3 years ago.  She feels trapped and stuck.   Pt states her husband Sandra Craig does not change anything he was doing before she lived with him.  He plays the tv very loud which really bothers pt.     Pt worries bc she had to sign a prenup agreement and if her husband dies would have to move out of the house within 6 months.  Nothing is left to pt if her husband dies.  Pt feels resentful about this.  Pt does not feel at home in her husband's house. Worked with pt on how she can do things on her own to improve her quality of life.  She is planning on joining a book club and get together with friends more.   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  Pt wants to attend AA more.  Addressed how she can access meetings.   Reviewed pt's treatment plan for annual update.   Updated pt's treatment plan and IA.   Pt participated in setting treatment goals.  Plan to meet monthly.     Mental Status Exam: Appearance:   Casual     Behavior:  Appropriate  Motor:  Normal  Speech/Language:   Normal Rate  Affect:  Appropriate  Mood:  normal  Thought process:  normal  Thought content:    WNL  Sensory/Perceptual disturbances:    WNL  Orientation:   oriented to person, place, time/date, and situation  Attention:  Good  Concentration:  Good  Memory:  WNL  Fund of knowledge:   Good  Insight:    Good  Judgment:   Good  Impulse Control:  Good    Reported Symptoms:  anxiety, sadness  Risk Assessment: Danger to Self:  No Self-injurious Behavior: No Danger to Others: No Duty to Warn:no Physical Aggression / Violence:No  Access to Firearms a concern: No  Gang Involvement:No  Patient / guardian was educated about steps to take if suicide or homicide risk level increases between visits: n/a While future psychiatric events cannot be accurately predicted, the patient does not currently require acute inpatient psychiatric care and does not currently meet [facility] involuntary commitment criteria.  Substance Abuse History: Current substance abuse: No     Past Psychiatric History:   Previous psychological history is significant for depression Outpatient Providers:pt has been in therapy in the past. History of Psych Hospitalization: No  Psychological Testing:  n/a    Abuse History:  Victim of: No.,  n/a    Report needed: No. Victim of Neglect:No. Perpetrator of  n/a   Witness / Exposure to Domestic Violence: No   Protective Services Involvement: No  Witness to [employer]  Violence:  No   Family History:  Family History  Problem Relation Age of Onset   Heart disease Mother        MI at age 47   Emphysema Mother    Thyroid disease Mother        Thyroidectomy/Benign   COPD Father    Pancreatic cancer Sister        died 32   Cancer Brother        adenocarcinoma right lung   COPD Brother    Lymphoma Maternal Grandmother    Colon cancer Paternal Grandmother    Heart attack Paternal Grandfather     Living situation: the patient lives with her spouse.  Pt grew up with both parents.  Pt is the oldest of 6 kids.  Pt's father worked out of town a lot.  Pt's mother was "high strung". Family history of mental health issues:   mother had anxiety.  Pt's mother was alcoholic. No abuse Pt's mother passed away at 77 years old.    Sexual Orientation: Straight  Relationship Status: married  Name of spouse / other:Sandra Craig.  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  If a parent, number of children / ages:none This is pt's 3rd marriage.   Her first marriage ended in divorce after 7 years and the second husband passed away in 11/14/10 after they were married for 34 years.    Support Systems: spouse friends  Surveyor, quantity Stress:  No   Income/Employment/Disability: Neurosurgeon: No   Educational History: Education: Risk manager: Protestant  Any cultural differences that may affect / interfere with treatment:  not applicable   Recreation/Hobbies: reading  Stressors: Health problems   Marital or family conflict    Strengths: Friends, Journalist, newspaper, and Able to Communicate Effectively  Barriers:  none   Legal History: Pending legal issue / charges: The patient has no significant history of legal issues. 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   Chronic insomnia    Chronic kidney disease    Cystitis    Depression    Diverticulitis of intestine without perforation or abscess without bleeding    Patient did have abscess but noperforation   Diverticulosis of colon (without mention of hemorrhage)    Endometriosis    Family history of malignant neoplasm of gastrointestinal tract    Fibromyalgia    Gastritis    Hiatal hernia    HIATAL HERNIA 10/08/2008   Qualifier: Diagnosis of  By: Koleen Distance CMA Duncan Dull), Hulan Saas     History of gallstones    HSV-1 infection    Hyperlipidemia    Hypertension    IBS (irritable bowel syndrome)    IC (interstitial cystitis)    Internal hemorrhoid     Mycobacterium avium complex (HCC)    history- took antibiotics and completed course   Osteonecrosis (HCC)    Osteopenia    Osteoporosis    Palpitations    Polycystic kidney disease    pt denies   Pulmonary nodule 07/2007   5 mm Anterior RUL   Small bowel obstruction (HCC)    Stroke (HCC) 11/14/2019   TIA    Past Surgical History:  Procedure Laterality Date   ABDOMINAL HYSTERECTOMY  1994   TAH,BSO FOR ENDOMETRIOSIS   ABDOMINAL SURGERY  2009/11/13  small intestine blockage   CHOLECYSTECTOMY     GASTROPLASTY  2011   small bowel resection -open   INGUINAL HERNIA REPAIR Right 02/28/2020   Procedure: LAPAROSCOPIC RIGHT INGUINAL HERNIA REPAIR WITH MESH;  Surgeon: Axel Filler, MD;  Location: [facility] OR;  Service: General;  Laterality: Right;   JOINT REPLACEMENT  2008   OOPHORECTOMY  1994   TAH,BSO   PELVIC LAPAROSCOPY     S/P right shoulder rotater cuff  200216/2011   Tear/adhesive capsulitis   SBO Lap  11   Adhesions and small internal hernia   SHOULDER HEMI-ARTHROPLASTY  06/05/2012   Procedure: SHOULDER HEMI-ARTHROPLASTY;  Surgeon: Eulas Post, MD;  Location: [facility] OR;  Service: Orthopedics;  Laterality: Right;  FOR ARTHRITIS   TOTAL HIP ARTHROPLASTY  FALL OF 2008   rt. partial hip replacement   TOTAL SHOULDER ARTHROPLASTY  06/05/2012   Procedure: TOTAL SHOULDER ARTHROPLASTY;  Surgeon: Eulas Post, MD;  Location: [facility] OR;  Service: Orthopedics;  Laterality: Right;  RIGHT SHOULDER TOTAL ARTHROPLASTY, HEMIARTHROPLASTY, SHOULDER, FOR ARTHRITIS   VIDEO BRONCHOSCOPY Bilateral 01/28/2015   Procedure: VIDEO BRONCHOSCOPY WITH FLUORO;  Surgeon: Leslye Peer, MD;  Location: [facility] ENDOSCOPY;  Service: Cardiopulmonary;  Laterality: Bilateral;   VIDEO BRONCHOSCOPY Bilateral 04/23/2019   Procedure: VIDEO BRONCHOSCOPY WITHOUT FLUORO;  Surgeon: Leslye Peer, MD;  Location: [facility] ENDOSCOPY;  Service: Cardiopulmonary;  Laterality: Bilateral;    Medications: Current Outpatient Medications  Medication Sig  Dispense Refill   aspirin 81 MG tablet Take 1 tablet (81 mg total) by mouth daily. 30 tablet    atorvastatin (LIPITOR) 40 MG tablet TAKE 1 TABLET(40 MG) BY MOUTH DAILY 90 tablet 3   Calcium Citrate-Vitamin D (CALCIUM + D PO) Take 1 tablet by mouth 2 (two) times daily.     diazepam (VALIUM) 5 MG tablet Take 5 mg by mouth daily.     DULoxetine (CYMBALTA) 30 MG capsule TAKE 2 CAPSULE BY MOUTH EVERY MORNING AND 1 CAPSULE BY MOUTH EVERY EVENING 270 capsule 3   famotidine (PEPCID) 20 MG tablet TAKE 1 TABLET(20 MG) BY MOUTH TWICE DAILY 180 tablet 1   gabapentin (NEURONTIN) 300 MG capsule TAKE 1 CAPSULE(300 MG) BY MOUTH TWICE DAILY AS NEEDED FOR PAIN 180 capsule 3   losartan (COZAAR) 25 MG tablet Take 12.5 mg by mouth daily.     Multiple Vitamins-Minerals (MULTIVITAMIN WITH MINERALS) tablet Take 1 tablet by mouth daily.     Polyethyl Glycol-Propyl Glycol (SYSTANE) 0.4-0.3 % GEL ophthalmic gel Place 1 application into both eyes 3 (three) times daily as needed (dry eye).     sucralfate (CARAFATE) 1 g tablet Take 1 tablet (1 g total) by mouth 4 (four) times daily. Dissolve each tablet in 15 cc water before use. 120 tablet 2   zolpidem (AMBIEN) 5 MG tablet TAKE 1 TABLET BY MOUTH EVERY DAY AT BEDTIME FOR SLEEP 90 tablet 1   No current facility-administered medications for this visit.    Allergies  Allergen Reactions   Azithromycin Rash    Pruritic rash diffuse     Celecoxib Nausea Only    Diagnoses:  F33.1  Plan of Care: Recommend ongoing therapy.   Pt participated in setting treatment goals.  Pt wants to have someone to talk to and to improve coping skills.  Pt wants to feel less anxious and depressed.  Plan to meet monthly.    Pt agrees with treatment plan.     Treatment Plan (Treatment Plan Target Date: 11/08/2024) Client Abilities/Strengths  Pt  is bright, engaging, and motivated for therapy.   Client Treatment Preferences  Individual therapy.  Client Statement of Needs  Improve coping  skills.  Symptoms  Depressed or irritable mood. Diminished interest in or enjoyment of activities. Lack of energy. Feelings of hopelessness, worthlessness, or inappropriate guilt. Unresolved grief issues.  Problems Addressed  Unipolar Depression Goals 1. Alleviate depressive symptoms and return to previous level of effective functioning. 2. Appropriately grieve the loss in order to normalize mood and to return to previously adaptive level of functioning. Objective Learn and implement behavioral strategies to overcome depression. Target Date: 2024-11-08 Frequency: Monthly  Progress: 40 Modality: individual  Related Interventions Engage the client in "behavioral activation," increasing his/her activity level and contact with sources of reward, while identifying processes that inhibit activation.  Use behavioral techniques such as instruction, rehearsal, role-playing, role reversal, as needed, to facilitate activity in the client's daily life; reinforce success. Assist the client in developing skills that increase the likelihood of deriving pleasure from behavioral activation (e.g., assertiveness skills, developing an exercise plan, less internal/more external focus, increased social involvement); reinforce success. Objective Identify important people in life, past and present, and describe the quality, good and poor, of those relationships. Target Date: 2024-11-08 Frequency:Monthly  Progress: 40 Modality: individual  Related Interventions Conduct Interpersonal Therapy beginning with the assessment of the client's "interpersonal inventory" of important past and present relationships; develop a case formulation linking depression to grief, interpersonal role disputes, role transitions, and/or interpersonal deficits). Objective Learn and implement problem-solving and decision-making skills. Target Date: 2024-11-08 Frequency: Monthly  Progress: 40 Modality: individual  Related  Interventions Conduct Problem-Solving Therapy using techniques such as psychoeducation, modeling, and role-playing to teach client problem-solving skills (i.e., defining a problem specifically, generating possible solutions, evaluating the pros and cons of each solution, selecting and implementing a plan of action, evaluating the efficacy of the plan, accepting or revising the plan); role-play application of the problem-solving skill to a real life issue. Encourage in the client the development of a positive problem orientation in which problems and solving them are viewed as a natural part of life and not something to be feared, despaired, or avoided. 3. Develop healthy interpersonal relationships that lead to the alleviation and help prevent the relapse of depression. 4. Develop healthy thinking patterns and beliefs about self, others, and the world that lead to the alleviation and help prevent the relapse of depression. 5. Recognize, accept, and cope with feelings of depression. Diagnosis F33.1  Conditions For Discharge Achievement of treatment goals and objectives    Salomon Fick, LCSW

## 2023-11-15 DIAGNOSIS — I129 Hypertensive chronic kidney disease with stage 1 through stage 4 chronic kidney disease, or unspecified chronic kidney disease: Secondary | ICD-10-CM | POA: Diagnosis not present

## 2023-11-15 DIAGNOSIS — E785 Hyperlipidemia, unspecified: Secondary | ICD-10-CM | POA: Diagnosis not present

## 2023-11-15 DIAGNOSIS — C37 Malignant neoplasm of thymus: Secondary | ICD-10-CM | POA: Diagnosis not present

## 2023-11-15 DIAGNOSIS — N1832 Chronic kidney disease, stage 3b: Secondary | ICD-10-CM | POA: Diagnosis not present

## 2023-11-21 DIAGNOSIS — H0012 Chalazion right lower eyelid: Secondary | ICD-10-CM | POA: Diagnosis not present

## 2023-11-21 DIAGNOSIS — H02882 Meibomian gland dysfunction right lower eyelid: Secondary | ICD-10-CM | POA: Diagnosis not present

## 2023-11-23 DIAGNOSIS — M25552 Pain in left hip: Secondary | ICD-10-CM | POA: Diagnosis not present

## 2023-11-27 LAB — LAB REPORT - SCANNED
Albumin, Urine POC: 4
Creatinine, POC: 21 mg/dL
Microalb Creat Ratio: 19

## 2023-11-29 ENCOUNTER — Telehealth: Payer: Self-pay | Admitting: *Deleted

## 2023-11-29 NOTE — Telephone Encounter (Signed)
 Patient left message asking about note on 6/2 lab appt - Labs for CT scan. She said there wasn't a CT scan scheduled.  Per Dr. Bing Buff OV note 07/10/23 - Follow-up in six months - Ensure lab work and CT scan are done one week before the next visit. Next scheduled appt with Dr. Marguerita Shih 01/10/24.  Contacted Centralized Scheduling on patient's behalf and scheduled CT at [facility] on 01/01/24 @ 12 noon/arrive 1145 following lab appt.   TCT patient with CT date and time. Patient verbalized understanding of appt date/time and location.

## 2023-12-01 ENCOUNTER — Other Ambulatory Visit: Payer: Self-pay | Admitting: Medical Oncology

## 2023-12-01 ENCOUNTER — Other Ambulatory Visit: Payer: Self-pay

## 2023-12-01 DIAGNOSIS — M25552 Pain in left hip: Secondary | ICD-10-CM | POA: Diagnosis not present

## 2023-12-04 ENCOUNTER — Other Ambulatory Visit: Payer: Self-pay | Admitting: Medical Oncology

## 2023-12-04 DIAGNOSIS — C3490 Malignant neoplasm of unspecified part of unspecified bronchus or lung: Secondary | ICD-10-CM

## 2023-12-04 NOTE — Progress Notes (Signed)
 CT order changed to without.

## 2023-12-05 ENCOUNTER — Telehealth: Payer: Self-pay | Admitting: *Deleted

## 2023-12-05 NOTE — Telephone Encounter (Signed)
 Returned call to patient. Patient calling to inquire about restarting prolia . Stopped in 2022 and 2023 due to cancer treatments. Patient states she fell in January of 2024 and hit the bathtub at home cracking 3 ribs. Unsure of when last BMD scan was. Requesting to schedule at [facility]. Patient advised to call [facility] and schedule. Has B&P scheduled for 01/16/24 with Dr. Tia Flowers.   Routing to provider to review and advise. Okay to send order to [facility] for BMD?

## 2023-12-05 NOTE — Telephone Encounter (Signed)
 Order for BMD faxed to [facility]. Patient updated and will call [facility] to schedule.   Encounter closed.

## 2023-12-07 ENCOUNTER — Ambulatory Visit: Payer: Medicare Other | Admitting: Psychology

## 2023-12-07 DIAGNOSIS — F331 Major depressive disorder, recurrent, moderate: Secondary | ICD-10-CM

## 2023-12-07 NOTE — Progress Notes (Signed)
  Behavioral Health Counselor/Therapist Progress Note  Patient ID: RHEVA VENTRICE, MRN: [id number],    Date: 12/07/2023  Time Spent: 2:00pm-2:45pm   45 minutes   Treatment Type: Individual Therapy  Reported Symptoms: stress  Mental Status Exam: Appearance:  Casual     Behavior: Appropriate  Motor: Normal  Speech/Language:  Normal Rate  Affect: Appropriate  Mood: normal  Thought process: normal  Thought content:   WNL  Sensory/Perceptual disturbances:   WNL  Orientation: oriented to person, place, time/date, and situation  Attention: Good  Concentration: Good  Memory: WNL  Fund of knowledge:  Good  Insight:   Good  Judgment:  Good  Impulse Control: Good   Risk Assessment: Danger to Self:  No Self-injurious Behavior: No Danger to Others: No Duty to Warn:no Physical Aggression / Violence:No  Access to Firearms a concern: No  Gang Involvement:No   Subjective: Pt present for face-to-face individual therapy via video.  Pt consents to telehealth video session and is aware of limitations and benefits of virtual sessions.  Location of pt: home Location of therapist: home office.   Pt and [name] went to an [employer] with friends and had a good time.  [name]'s son helped with driving the RV to make the trip easier for pt and [name]. Pt talked about her health.  She is still having a lot of pain and is going to have a MRI and see doctors to address treatment options.   Pt continues to feel disappointment with her marriage but is coping by trying to accept the way Rich Champ is and getting her needs met by making more social connections with friends.  Pt is also planning to join a book club.   Worked on self care strategies. Provided supportive therapy.    Interventions: Cognitive Behavioral Therapy and Insight-Oriented  Diagnosis: F33.1   Plan of Care: Recommend ongoing therapy.   Pt participated in setting treatment goals.  Pt wants to have someone to talk to and to  improve coping skills.  Pt wants to feel less anxious and depressed.  Plan to meet monthly.    Pt agrees with treatment plan.     Treatment Plan (Treatment Plan Target Date: 11/08/2024) Client Abilities/Strengths  Pt is bright, engaging, and motivated for therapy.   Client Treatment Preferences  Individual therapy.  Client Statement of Needs  Improve coping skills.  Symptoms  Depressed or irritable mood. Diminished interest in or enjoyment of activities. Lack of energy. Feelings of hopelessness, worthlessness, or inappropriate guilt. Unresolved grief issues.  Problems Addressed  Unipolar Depression Goals 1. Alleviate depressive symptoms and return to previous level of effective functioning. 2. Appropriately grieve the loss in order to normalize mood and to return to previously adaptive level of functioning. Objective Learn and implement behavioral strategies to overcome depression. Target Date: 2024-11-08 Frequency: Monthly  Progress: 40 Modality: individual  Related Interventions Engage the client in "behavioral activation," increasing his/her activity level and contact with sources of reward, while identifying processes that inhibit activation.  Use behavioral techniques such as instruction, rehearsal, role-playing, role reversal, as needed, to facilitate activity in the client's daily life; reinforce success. Assist the client in developing skills that increase the likelihood of deriving pleasure from behavioral activation (e.g., assertiveness skills, developing an exercise plan, less internal/more external focus, increased social involvement); reinforce success. Objective Identify important people in life, past and present, and describe the quality, good and poor, of those relationships. Target Date: 2024-11-08 Frequency:Monthly  Progress: 40 Modality: individual  Related Interventions Conduct Interpersonal Therapy beginning with the assessment of the client's "interpersonal  inventory" of important past and present relationships; develop a case formulation linking depression to grief, interpersonal role disputes, role transitions, and/or interpersonal deficits). Objective Learn and implement problem-solving and decision-making skills. Target Date: 2024-11-08 Frequency: Monthly  Progress: 40 Modality: individual  Related Interventions Conduct Problem-Solving Therapy using techniques such as psychoeducation, modeling, and role-playing to teach client problem-solving skills (i.e., defining a problem specifically, generating possible solutions, evaluating the pros and cons of each solution, selecting and implementing a plan of action, evaluating the efficacy of the plan, accepting or revising the plan); role-play application of the problem-solving skill to a real life issue. Encourage in the client the development of a positive problem orientation in which problems and solving them are viewed as a natural part of life and not something to be feared, despaired, or avoided. 3. Develop healthy interpersonal relationships that lead to the alleviation and help prevent the relapse of depression. 4. Develop healthy thinking patterns and beliefs about self, others, and the world that lead to the alleviation and help prevent the relapse of depression. 5. Recognize, accept, and cope with feelings of depression. Diagnosis F33.1  Conditions For Discharge Achievement of treatment goals and objectives   Willey Harrier, LCSW

## 2023-12-13 DIAGNOSIS — M8588 Other specified disorders of bone density and structure, other site: Secondary | ICD-10-CM | POA: Diagnosis not present

## 2023-12-13 DIAGNOSIS — Z8262 Family history of osteoporosis: Secondary | ICD-10-CM | POA: Diagnosis not present

## 2023-12-13 DIAGNOSIS — N958 Other specified menopausal and perimenopausal disorders: Secondary | ICD-10-CM | POA: Diagnosis not present

## 2023-12-13 DIAGNOSIS — E2839 Other primary ovarian failure: Secondary | ICD-10-CM | POA: Diagnosis not present

## 2023-12-19 ENCOUNTER — Ambulatory Visit: Payer: Self-pay | Admitting: Obstetrics and Gynecology

## 2023-12-19 ENCOUNTER — Encounter: Payer: Self-pay | Admitting: Obstetrics and Gynecology

## 2023-12-19 DIAGNOSIS — M25552 Pain in left hip: Secondary | ICD-10-CM | POA: Diagnosis not present

## 2023-12-20 ENCOUNTER — Telehealth: Payer: Self-pay

## 2023-12-20 DIAGNOSIS — K080 Exfoliation of teeth due to systemic causes: Secondary | ICD-10-CM | POA: Diagnosis not present

## 2023-12-20 NOTE — Telephone Encounter (Signed)
 Patient inquired whether she needed to schedule a follow-up appointment with Dr. Luna Salinas. She reported that she had to cancel a previously scheduled appointment with his office in December 2024. Informed patient that her last visit with Dr. Luna Salinas was in June 2024, and per his note, she was advised to return in 6 months following a CT chest for a 1-year follow-up. Patient stated she will contact Dr. Audree Bless office to schedule the appointment.

## 2023-12-20 NOTE — Telephone Encounter (Signed)
 Patient called and LVM stating that she had a question.  Tried to reach patient and LVM for a return call.

## 2023-12-26 DIAGNOSIS — M25552 Pain in left hip: Secondary | ICD-10-CM | POA: Diagnosis not present

## 2023-12-29 DIAGNOSIS — M25552 Pain in left hip: Secondary | ICD-10-CM | POA: Diagnosis not present

## 2024-01-01 ENCOUNTER — Inpatient Hospital Stay: Payer: Medicare Other | Attending: Internal Medicine

## 2024-01-01 ENCOUNTER — Ambulatory Visit (HOSPITAL_COMMUNITY)
Admission: RE | Admit: 2024-01-01 | Discharge: 2024-01-01 | Disposition: A | Discharge status: Home / Self Care | Source: Ambulatory Visit | Attending: Internal Medicine | Admitting: Internal Medicine

## 2024-01-01 DIAGNOSIS — Z85238 Personal history of other malignant neoplasm of thymus: Secondary | ICD-10-CM | POA: Insufficient documentation

## 2024-01-01 DIAGNOSIS — J9859 Other diseases of mediastinum, not elsewhere classified: Secondary | ICD-10-CM | POA: Diagnosis not present

## 2024-01-01 DIAGNOSIS — C3490 Malignant neoplasm of unspecified part of unspecified bronchus or lung: Secondary | ICD-10-CM

## 2024-01-01 DIAGNOSIS — Z85118 Personal history of other malignant neoplasm of bronchus and lung: Secondary | ICD-10-CM | POA: Diagnosis not present

## 2024-01-01 LAB — CMP (CANCER CENTER ONLY)
ALT: 19 U/L (ref 0–44)
AST: 18 U/L (ref 15–41)
Albumin: 3.9 g/dL (ref 3.5–5.0)
Alkaline Phosphatase: 74 U/L (ref 38–126)
Anion gap: 4 — ABNORMAL LOW (ref 5–15)
BUN: 32 mg/dL — ABNORMAL HIGH (ref 8–23)
CO2: 31 mmol/L (ref 22–32)
Calcium: 9.8 mg/dL (ref 8.9–10.3)
Chloride: 106 mmol/L (ref 98–111)
Creatinine: 1.82 mg/dL — ABNORMAL HIGH (ref 0.44–1.00)
GFR, Estimated: 28 mL/min — ABNORMAL LOW (ref >60)
Glucose, Bld: 86 mg/dL (ref 70–99)
Potassium: 4.6 mmol/L (ref 3.5–5.1)
Sodium: 141 mmol/L (ref 135–145)
Total Bilirubin: 0.3 mg/dL (ref 0.0–1.2)
Total Protein: 6.6 g/dL (ref 6.5–8.1)

## 2024-01-01 LAB — CBC WITH DIFFERENTIAL (CANCER CENTER ONLY)
Abs Immature Granulocytes: 0.02 10*3/uL (ref 0.00–0.07)
Basophils Absolute: 0 10*3/uL (ref 0.0–0.1)
Basophils Relative: 0 %
Eosinophils Absolute: 0 10*3/uL (ref 0.0–0.5)
Eosinophils Relative: 0 %
HCT: 36.3 % (ref 36.0–46.0)
Hemoglobin: 11.5 g/dL — ABNORMAL LOW (ref 12.0–15.0)
Immature Granulocytes: 0 %
Lymphocytes Relative: 17 %
Lymphs Abs: 1.1 10*3/uL (ref 0.7–4.0)
MCH: 30.2 pg (ref 26.0–34.0)
MCHC: 31.7 g/dL (ref 30.0–36.0)
MCV: 95.3 fL (ref 80.0–100.0)
Monocytes Absolute: 0.6 10*3/uL (ref 0.1–1.0)
Monocytes Relative: 8 %
Neutro Abs: 5.2 10*3/uL (ref 1.7–7.7)
Neutrophils Relative %: 75 %
Platelet Count: 204 10*3/uL (ref 150–400)
RBC: 3.81 MIL/uL — ABNORMAL LOW (ref 3.87–5.11)
RDW: 13.9 % (ref 11.5–15.5)
WBC Count: 6.9 10*3/uL (ref 4.0–10.5)
nRBC: 0 % (ref 0.0–0.2)

## 2024-01-02 ENCOUNTER — Telehealth: Payer: Self-pay

## 2024-01-02 NOTE — Telephone Encounter (Signed)
 Patient called this morning regarding the CT scan results and requested an earlier appointment if needed.  Patient's next appointment with Dr. Marguerita Shih is scheduled for 01/10/24 at 10:45 AM. Informed patient there is no availability for an earlier appointment. Patient stated that was acceptable and she will keep the current appointment. Advised patient that I would inform Dr. Marguerita Shih of her request and that if an earlier visit is needed, I would contact her. Otherwise, we will see her as scheduled on 01/10/24. Patient acknowledged and voiced understanding.

## 2024-01-05 ENCOUNTER — Encounter: Payer: Self-pay | Admitting: Family Medicine

## 2024-01-05 ENCOUNTER — Ambulatory Visit (INDEPENDENT_AMBULATORY_CARE_PROVIDER_SITE_OTHER): Admitting: Family Medicine

## 2024-01-05 ENCOUNTER — Ambulatory Visit: Payer: Self-pay | Admitting: Family Medicine

## 2024-01-05 VITALS — BP 144/90 | HR 83 | Temp 97.3°F | Ht 64.0 in | Wt 144.0 lb

## 2024-01-05 DIAGNOSIS — I10 Essential (primary) hypertension: Secondary | ICD-10-CM

## 2024-01-05 DIAGNOSIS — R739 Hyperglycemia, unspecified: Secondary | ICD-10-CM

## 2024-01-05 DIAGNOSIS — Z131 Encounter for screening for diabetes mellitus: Secondary | ICD-10-CM

## 2024-01-05 DIAGNOSIS — Z0000 Encounter for general adult medical examination without abnormal findings: Secondary | ICD-10-CM | POA: Diagnosis not present

## 2024-01-05 DIAGNOSIS — E785 Hyperlipidemia, unspecified: Secondary | ICD-10-CM

## 2024-01-05 LAB — LIPID PANEL
Cholesterol: 183 mg/dL (ref 0–200)
HDL: 64.6 mg/dL (ref >39.00)
LDL Cholesterol: 87 mg/dL (ref 0–99)
NonHDL: 118.44
Total CHOL/HDL Ratio: 3
Triglycerides: 156 mg/dL — ABNORMAL HIGH (ref 0.0–149.0)
VLDL: 31.2 mg/dL (ref 0.0–40.0)

## 2024-01-05 LAB — CBC WITH DIFFERENTIAL/PLATELET
Basophils Absolute: 0 10*3/uL (ref 0.0–0.1)
Basophils Relative: 0.4 % (ref 0.0–3.0)
Eosinophils Absolute: 0 10*3/uL (ref 0.0–0.7)
Eosinophils Relative: 0.7 % (ref 0.0–5.0)
HCT: 38.3 % (ref 36.0–46.0)
Hemoglobin: 12.6 g/dL (ref 12.0–15.0)
Lymphocytes Relative: 19.6 % (ref 12.0–46.0)
Lymphs Abs: 1.1 10*3/uL (ref 0.7–4.0)
MCHC: 32.8 g/dL (ref 30.0–36.0)
MCV: 93.3 fl (ref 78.0–100.0)
Monocytes Absolute: 0.5 10*3/uL (ref 0.1–1.0)
Monocytes Relative: 8.6 % (ref 3.0–12.0)
Neutro Abs: 4 10*3/uL (ref 1.4–7.7)
Neutrophils Relative %: 70.7 % (ref 43.0–77.0)
Platelets: 242 10*3/uL (ref 150.0–400.0)
RBC: 4.11 Mil/uL (ref 3.87–5.11)
RDW: 14.8 % (ref 11.5–15.5)
WBC: 5.7 10*3/uL (ref 4.0–10.5)

## 2024-01-05 LAB — HEMOGLOBIN A1C: Hgb A1c MFr Bld: 5.8 % (ref 4.6–6.5)

## 2024-01-05 LAB — COMPREHENSIVE METABOLIC PANEL WITH GFR
ALT: 23 U/L (ref 0–35)
AST: 22 U/L (ref 0–37)
Albumin: 4 g/dL (ref 3.5–5.2)
Alkaline Phosphatase: 63 U/L (ref 39–117)
BUN: 32 mg/dL — ABNORMAL HIGH (ref 6–23)
CO2: 33 meq/L — ABNORMAL HIGH (ref 19–32)
Calcium: 9.9 mg/dL (ref 8.4–10.5)
Chloride: 101 meq/L (ref 96–112)
Creatinine, Ser: 1.97 mg/dL — ABNORMAL HIGH (ref 0.40–1.20)
GFR: 23.39 mL/min — ABNORMAL LOW (ref >60.00)
Glucose, Bld: 87 mg/dL (ref 70–99)
Potassium: 4.3 meq/L (ref 3.5–5.1)
Sodium: 141 meq/L (ref 135–145)
Total Bilirubin: 0.4 mg/dL (ref 0.2–1.2)
Total Protein: 6.7 g/dL (ref 6.0–8.3)

## 2024-01-05 MED ORDER — ZOLPIDEM TARTRATE 5 MG PO TABS: ORAL_TABLET | ORAL | 1 refills | Status: DC

## 2024-01-05 NOTE — Patient Instructions (Addendum)
 Vaccines to consider at [facility] (cheaper there)  Final shingrix Tetanus, Diphtheria, and Pertussis (Tdap)  Covid  Please stop by lab before you go If you have mychart- we will send your results within 3 business days of us  receiving them.  If you do not have mychart- we will call you about results within 5 business days of us  receiving them.  *please also note that you will see labs on mychart as soon as they post. I will later go in and write notes on them- will say "notes from Dr. Arlene Ben"   Recommended follow up: Return in about 6 months (around 07/06/2024) for followup or sooner if needed.Schedule b4 you leave.

## 2024-01-05 NOTE — Progress Notes (Signed)
 Phone [phone]   Subjective:  Patient presents today for their annual physical. Chief complaint-noted.   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   Chronic insomnia    Chronic kidney disease    Cystitis    Depression    Diverticulitis of intestine without perforation or abscess without bleeding    Patient did have abscess but noperforation   Diverticulosis of colon (without mention of hemorrhage)    Endometriosis    Family history of malignant neoplasm of gastrointestinal tract    Fibromyalgia    Gastritis    Hiatal hernia    HIATAL HERNIA 10/08/2008   Qualifier: Diagnosis of  By: Celestia Colander CMA (AAMA), Christine Cozier     History of gallstones    HSV-1 infection    Hyperlipidemia    Hypertension    IBS (irritable bowel syndrome)    IC (interstitial cystitis)    Internal hemorrhoid    Mycobacterium avium complex (HCC)    history- took antibiotics and completed course   Osteonecrosis (HCC)    Osteopenia    Osteoporosis    Palpitations    Polycystic kidney disease    pt denies   Pulmonary nodule 07/2007   5 mm Anterior RUL   Small bowel obstruction (HCC)    Stroke (HCC) 2021   TIA   Patient Active Problem List   Diagnosis Date Noted   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    Priority: High   History of transient ischemic attack (TIA) 06/02/2017    Priority: High   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    Priority: High   Chronic low back pain 12/20/2013    Priority: High   Fibromyalgia 12/05/2007    Priority: High   UTI (urinary tract infection) 12/15/2018    Priority: Medium    Chronic kidney disease, stage 3b  (HCC) 06/02/2017    Priority: Medium    Near syncope 03/11/2017    Priority: Medium    Hyperglycemia 06/27/2012    Priority: Medium    History of small bowel obstruction 06/28/2011    Priority: Medium    GERD with stricture 06/28/2011    Priority: Medium    Hypertension     Priority: Medium    Hyperlipidemia     Priority: Medium    INSOMNIA, CHRONIC 10/08/2008    Priority: Medium    Depression 02/08/2008    Priority: Medium    Facet arthropathy 09/14/2019    Priority: Low   Vaginal dryness 01/29/2019    Priority: Low   Chronic right shoulder pain 03/07/2014    Priority: Low   Benign essential tremor 10/09/2013    Priority: Low   Diverticulitis large intestine 02/11/2012    Priority: Low   Irritable bowel syndrome (IBS) 06/28/2011    Priority: Low   IC (interstitial cystitis)     Priority: Low   Hemorrhoids 10/08/2008    Priority: Low   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    Priority: Low   HEMATURIA UNSPECIFIED 07/04/2008    Priority: Low   Lung nodules 08/09/2007    Priority: Low   Major depressive disorder, recurrent, in full remission (HCC) 11/30/2022  Chest pain 07/03/2022   Memory loss 07/21/2020   Past Surgical History:  Procedure Laterality Date   ABDOMINAL HYSTERECTOMY  1994   TAH,BSO FOR ENDOMETRIOSIS   ABDOMINAL SURGERY  2011   small intestine blockage   CHOLECYSTECTOMY     GASTROPLASTY  2011   small bowel resection -open   INGUINAL HERNIA REPAIR Right 02/28/2020   Procedure: LAPAROSCOPIC RIGHT INGUINAL HERNIA REPAIR WITH MESH;  Surgeon: Shela Derby, MD;  Location: [facility] OR;  Service: General;  Laterality: Right;   JOINT REPLACEMENT  2008   OOPHORECTOMY  1994   TAH,BSO   PELVIC LAPAROSCOPY     S/P right shoulder rotater cuff  200216/2011   Tear/adhesive capsulitis   SBO Lap  11   Adhesions and small internal hernia   SHOULDER HEMI-ARTHROPLASTY  06/05/2012   Procedure: SHOULDER HEMI-ARTHROPLASTY;  Surgeon: Neville Barbone,  MD;  Location: [facility] OR;  Service: Orthopedics;  Laterality: Right;  FOR ARTHRITIS   TOTAL HIP ARTHROPLASTY  FALL OF 2008   rt. partial hip replacement   TOTAL SHOULDER ARTHROPLASTY  06/05/2012   Procedure: TOTAL SHOULDER ARTHROPLASTY;  Surgeon: Neville Barbone, MD;  Location: [facility] OR;  Service: Orthopedics;  Laterality: Right;  RIGHT SHOULDER TOTAL ARTHROPLASTY, HEMIARTHROPLASTY, SHOULDER, FOR ARTHRITIS   VIDEO BRONCHOSCOPY Bilateral 01/28/2015   Procedure: VIDEO BRONCHOSCOPY WITH FLUORO;  Surgeon: Denson Flake, MD;  Location: [facility] ENDOSCOPY;  Service: Cardiopulmonary;  Laterality: Bilateral;   VIDEO BRONCHOSCOPY Bilateral 04/23/2019   Procedure: VIDEO BRONCHOSCOPY WITHOUT FLUORO;  Surgeon: Denson Flake, MD;  Location: [facility] ENDOSCOPY;  Service: Cardiopulmonary;  Laterality: Bilateral;    Family History  Problem Relation Age of Onset   Heart disease Mother        MI at age 55   Emphysema Mother    Thyroid  disease Mother        Thyroidectomy/Benign   COPD Father    Pancreatic cancer Sister        died 50   Cancer Brother        adenocarcinoma right lung   COPD Brother    Lymphoma Maternal Grandmother    Colon cancer Paternal Grandmother    Heart attack Paternal Grandfather     Medications- reviewed and updated Current Outpatient Medications  Medication Sig Dispense Refill   aspirin  81 MG tablet Take 1 tablet (81 mg total) by mouth daily. 30 tablet    atorvastatin  (LIPITOR) 40 MG tablet TAKE 1 TABLET(40 MG) BY MOUTH DAILY 90 tablet 3   Calcium  Citrate-Vitamin D  (CALCIUM  + D PO) Take 1 tablet by mouth 2 (two) times daily.     diazepam (VALIUM) 5 MG tablet Take 5 mg by mouth daily.     DULoxetine  (CYMBALTA ) 30 MG capsule TAKE 2 CAPSULE BY MOUTH EVERY MORNING AND 1 CAPSULE BY MOUTH EVERY EVENING 270 capsule 3   famotidine  (PEPCID ) 20 MG tablet TAKE 1 TABLET(20 MG) BY MOUTH TWICE DAILY 180 tablet 1   gabapentin  (NEURONTIN ) 300 MG capsule TAKE 1 CAPSULE(300 MG) BY MOUTH TWICE DAILY AS NEEDED  FOR PAIN 180 capsule 3   losartan  (COZAAR ) 25 MG tablet Take 12.5 mg by mouth daily.     methylPREDNISolone  (MEDROL  DOSEPAK) 4 MG TBPK tablet Take 4 mg by mouth as directed.     Multiple Vitamins-Minerals (MULTIVITAMIN WITH MINERALS) tablet Take 1 tablet by mouth daily.     Polyethyl Glycol-Propyl Glycol (SYSTANE) 0.4-0.3 % GEL ophthalmic gel Place 1 application into both eyes 3 (three) times daily as needed (  dry eye).     sucralfate  (CARAFATE ) 1 g tablet Take 1 tablet (1 g total) by mouth 4 (four) times daily. Dissolve each tablet in 15 cc water  before use. 120 tablet 2   zolpidem  (AMBIEN ) 5 MG tablet TAKE 1 TABLET BY MOUTH EVERY DAY AT BEDTIME FOR SLEEP 90 tablet 1   No current facility-administered medications for this visit.    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 Married to Long term boyfriend 2 years prior 2021. Prior Widowed.       Enjoys reading    Objective  Objective:  BP (!) 144/90 Comment: no improvement on repeat  Pulse 83   Temp (!) 97.3 F (36.3 C)   Ht 5\' 4"  (1.626 m)   Wt 144 lb (65.3 kg)   LMP 08/25/1992   SpO2 96%   BMI 24.72 kg/m  Gen: NAD, resting comfortably HEENT: Mucous membranes are moist. Oropharynx normal Neck: no thyromegaly CV: RRR no murmurs rubs or gallops Lungs: CTAB no crackles, wheeze, rhonchi Abdomen: soft/nontender/nondistended/normal bowel sounds. No rebound or guarding.  Ext: no edema Skin: warm, dry Neuro: grossly normal, moves all extremities, PERRLA   Assessment and Plan   83 y.o. female presenting for annual physical.  Health Maintenance counseling: 1. Anticipatory guidance: Patient counseled regarding regular dental exams -q6 months, eye exams - yearly,  avoiding smoking and second hand smoke, limiting  alcohol to 1 beverage per day- still alcohol free since 1970s , no illicit drugs .   2. Risk factor reduction:  Advised patient of need for regular exercise and diet rich and fruits and vegetables to reduce risk of heart attack and stroke.  Exercise- apparently trying to get in with someone that can give her exercises safe for the hip- that has been a barrier.  Diet/weight management-within 4 lbs of last year- still with healthy weight- trying to eat reasonably health.  Wt Readings from Last 3 Encounters:  01/05/24 144 lb (65.3 kg)  11/01/23 138 lb (62.6 kg)  10/09/23 143 lb 3.2 oz (65 kg)  3. Immunizations/screenings/ancillary studies-is due Tdap and COVID vaccination at pharmacyas well as Shingrix Immunization History  Administered Date(s) Administered   Fluad Quad(high Dose 65+) 04/02/2019, 04/05/2020, 05/06/2021   Fluad Trivalent(High Dose 65+) 06/02/2023   Influenza Split 05/23/2012   Influenza Whole 05/31/2006, 05/28/2007, 05/14/2008, 04/22/2009, 05/01/2010, 04/12/2011   Influenza, High Dose Seasonal PF 05/19/2013, 03/26/2014, 04/21/2016, 04/21/2017, 04/21/2018, 04/02/2019, 05/06/2022   Influenza,inj,Quad PF,6+ Mos 04/15/2015   Influenza-Unspecified 04/29/2018   Moderna Sars-Covid-2 Vaccination 04/14/2021   PFIZER(Purple Top)SARS-COV-2 Vaccination 09/07/2019, 09/27/2019, 05/27/2020   PNEUMOCOCCAL CONJUGATE-20 10/09/2023   Pneumococcal Conjugate-13 02/03/2015   Pneumococcal Polysaccharide-23 05/31/2006, 06/07/2012   Td 12/21/2009   Zoster Recombinant(Shingrix) 05/26/2022   Zoster, Live 02/06/2006  4. Cervical cancer screening- still sees GYN but technically past age based screening recommendations  5. Breast cancer screening-  breast exam with GYN and mammogram - 05/15/2023 6. Colon cancer screening - September 12, 2019 -6 precancerous polyps-plan had been for 3 years a down to discuss whether she needs repeat or not.  With other health issues recently she prefers to hold off for now  once again-  opts out  7. Skin cancer screening-Dr. Mark Sil dermatology yearly typically.  advised regular sunscreen use. Denies  worrisome, changing, or new skin lesions.  8. Birth control/STD check-monogamous/postmenopausal  9. Osteoporosis screening at 65-known osteoporosis but had 3 weeks ago and reports was in osteopenia range on scores- is looking at something like Evenity and then restarting Prolia  later for maintenance- had difficulty with Prolia  and her other medical issues- balancing it all in the past but things are more stable now 10. Smoking associated screening -former smoker but quit in 1970s and no regular screening required    Status of chronic or acute concerns   # Social update-last visit was considering assisted living but too costly  - husband on keytruda and helping with the non operable cancer  #Left hip arthritis- on methylprednisolone  5 mg dose pack through Dr. Agatha Horsfall- aware of risks to bone density  #Pulmonary MAI- follow up with Dr. 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  # Left vocal cord paralysis-treated with gabapentin  300 mg and had injection by [facility] in early 2024-likely injury during operation for thymectomy-she also completed radiation treatments October 25, 2022 for malignant neoplasm of the thymus - tolerating this- imperfect control   # Depression/insomnia #fibromyalgia- also on gabapentin  S: Medication: Cymbalta  30 mg twice daily, Ambien  5 mg causes vivid dreams at times but still takes- not horrible just vivid Counseling: Working with Clide Dalton, LCSW stated in  2023 Stressors: Challenging marriage  with his health issues A/P: ups and downs with depression- still on Cymbalta  and seeing Terri Bauert. Does need refill on Ambien . No suicidal ideation.  Death is more on mind with health issues with her and husband.   #hypertension with orthostatic hypotension S: medication: Losartan  12.5 mg restarted by Dr. Ricard Chalk presyncope but was having elevated microalbumin to creatinine ratio compared to prior - Amlodipine  1.25 mg before bed in the past-stopped with presyncope/syncope issues Home readings #s: no recent checks BP Readings from Last 3 Encounters:  01/05/24 (!) 144/90  11/01/23 (!) 148/87  10/09/23 122/82   A/P: blood pressure mildly elevated but with orthostatic history and fall concern we really are going to tolerate mild elevations- was 140's/80's at Dr. Christianne Cowper and she felt acceptable as well  #Chronic kidney disease stage III and on border with 4- sees Dr. Christianne Cowper S: GFR is typically in the 30srange but dipped into 20's- most recently up to 28  -Patient knows to avoid NSAIDs  A/P: hopefully stable- update cmp today.   #hyperlipidemia/elevated LFT history #possible TIA history 06/2017- aspirin  81 mg S: Medication:atorvastatin  40 mg -in the past Rosuvastatin  40 mg ( change with creatinine clearance around 30-changed to atorvastatin ) Lab Results  Component Value Date   CHOL 143 11/30/2022   HDL 43.90 11/30/2022   LDLCALC 68 11/30/2022   LDLDIRECT 59.0 11/03/2020   TRIG 156.0 (H) 11/30/2022   CHOLHDL 3 11/30/2022   A/P: lipids at goal last year- update today- continue current medications  Has had possible transient ishemic attack (TIA) in past so still on aspirin  81 mg   # Hyperglycemia/insulin resistance/prediabetes-A1c as high as 5.06 February 2021 S:  Medication: none Lab Results  Component Value Date   HGBA1C 6.0 11/30/2022  HGBA1C 6.1 06/22/2022   HGBA1C 5.8 12/07/2021   A/P: hopefully stable or improved- update a1c today. Continue  current meds for now - may be higher with needing to be on steroids lately   Recommended follow up: Return in about 6 months (around 07/06/2024) for followup or sooner if needed.Schedule b4 you leave. Future Appointments  Date Time Provider Department Center  01/09/2024  2:00 PM Natalie Bailey, [place] [facility] None  01/10/2024 10:45 AM Marlene Simas, MD [facility] None  01/10/2024  1:40 PM [facility] ANNUAL WELLNESS VISIT 1 [facility] PEC  01/16/2024  2:30 PM Reinaldo Caras, MD [facility] None  01/23/2024  1:00 PM Zelphia Higashi, MD [facility] H[facility]V  02/06/2024  2:00 PM Bauert, Quilla [place], LCSW [facility] None  03/05/2024  2:00 PM Bauert, Terri W, LCSW [facility] None   Lab/Order associations: fasting   ICD-10-CM   1. Preventative health care  Z00.00     2. Screening for diabetes mellitus  Z13.1 Hemoglobin A1c    3. Hyperglycemia  R73.9 Hemoglobin A1c    4. Primary hypertension  I10 CBC with Differential/Platelet    Comprehensive metabolic panel with GFR    Lipid panel    5. Hyperlipidemia, unspecified hyperlipidemia type  E78.5 CBC with Differential/Platelet    Comprehensive metabolic panel with GFR    Lipid panel      Meds ordered this encounter  Medications   zolpidem  (AMBIEN ) 5 MG tablet    Sig: TAKE 1 TABLET BY MOUTH EVERY DAY AT BEDTIME FOR SLEEP    Dispense:  90 tablet    Refill:  1    Return precautions advised.  Clarisa Crooked, MD

## 2024-01-09 ENCOUNTER — Ambulatory Visit (INDEPENDENT_AMBULATORY_CARE_PROVIDER_SITE_OTHER): Admitting: Psychology

## 2024-01-09 DIAGNOSIS — F331 Major depressive disorder, recurrent, moderate: Secondary | ICD-10-CM | POA: Diagnosis not present

## 2024-01-09 NOTE — Progress Notes (Signed)
 [facility] Counselor/Therapist Progress Note  Patient ID: TATYM SCHERMER, MRN: [id number],    Date: 01/09/2024  Time Spent: 2:00pm-2:45pm   45 minutes   Treatment Type: Individual Therapy  Reported Symptoms: stress  Mental Status Exam: Appearance:  Casual     Behavior: Appropriate  Motor: Normal  Speech/Language:  Normal Rate  Affect: Appropriate  Mood: normal  Thought process: normal  Thought content:   WNL  Sensory/Perceptual disturbances:   WNL  Orientation: oriented to person, place, time/date, and situation  Attention: Good  Concentration: Good  Memory: WNL  Fund of knowledge:  Good  Insight:   Good  Judgment:  Good  Impulse Control: Good   Risk Assessment: Danger to Self:  No Self-injurious Behavior: No Danger to Others: No Duty to Warn:no Physical Aggression / Violence:No  Access to Firearms a concern: No  Gang Involvement:No   Subjective: Pt present for face-to-face individual therapy via video.  Pt consents to telehealth video session and is aware of limitations and benefits of virtual sessions.  Location of pt: home Location of therapist: home office.   Pt talked about her health.  She had her CT scan and was diagnosed with non small cell lung cancer.   Pt knew she had cancer but they have specified the type.   Pt meets with her doctor tomorrow to address the diagnosis and treatment options.   Pt is worried about the cancer.   She has been feeling more fatigued.  Pt had an MRI and found out the issue with her leg is hip related.  She is going to be referred to physical therapy.   Pt is hoping that will help with pain relief.   Pt continues to feel disappointment with her marriage but is coping by trying to accept the way [place] is and getting her needs met by making more social connections with friends.  Pt is looking into living situations so she will be prepared when the time comes for her to move.  Pt has a friend who may have a place for her  to stay.   Worked on self care strategies. Provided supportive therapy.    Interventions: Cognitive Behavioral Therapy and Insight-Oriented  Diagnosis: F33.1   Plan of Care: Recommend ongoing therapy.   Pt participated in setting treatment goals.  Pt wants to have someone to talk to and to improve coping skills.  Pt wants to feel less anxious and depressed.  Plan to meet monthly.    Pt agrees with treatment plan.     Treatment Plan (Treatment Plan Target Date: 11/08/2024) Client Abilities/Strengths  Pt is bright, engaging, and motivated for therapy.   Client Treatment Preferences  Individual therapy.  Client Statement of Needs  Improve coping skills.  Symptoms  Depressed or irritable mood. Diminished interest in or enjoyment of activities. Lack of energy. Feelings of hopelessness, worthlessness, or inappropriate guilt. Unresolved grief issues.  Problems Addressed  Unipolar Depression Goals 1. Alleviate depressive symptoms and return to previous level of effective functioning. 2. Appropriately grieve the loss in order to normalize mood and to return to previously adaptive level of functioning. Objective Learn and implement behavioral strategies to overcome depression. Target Date: 2024-11-08 Frequency: Monthly  Progress: 40 Modality: individual  Related Interventions Engage the client in "behavioral activation," increasing his/her activity level and contact with sources of reward, while identifying processes that inhibit activation.  Use behavioral techniques such as instruction, rehearsal, role-playing, role reversal, as needed, to facilitate activity in the client's  daily life; reinforce success. Assist the client in developing skills that increase the likelihood of deriving pleasure from behavioral activation (e.g., assertiveness skills, developing an exercise plan, less internal/more external focus, increased social involvement); reinforce success. Objective Identify important  people in life, past and present, and describe the quality, good and poor, of those relationships. Target Date: 2024-11-08 Frequency:Monthly  Progress: 40 Modality: individual  Related Interventions Conduct Interpersonal Therapy beginning with the assessment of the client's "interpersonal inventory" of important past and present relationships; develop a case formulation linking depression to grief, interpersonal role disputes, role transitions, and/or interpersonal deficits). Objective Learn and implement problem-solving and decision-making skills. Target Date: 2024-11-08 Frequency: Monthly  Progress: 40 Modality: individual  Related Interventions Conduct Problem-Solving Therapy using techniques such as psychoeducation, modeling, and role-playing to teach client problem-solving skills (i.e., defining a problem specifically, generating possible solutions, evaluating the pros and cons of each solution, selecting and implementing a plan of action, evaluating the efficacy of the plan, accepting or revising the plan); role-play application of the problem-solving skill to a real life issue. Encourage in the client the development of a positive problem orientation in which problems and solving them are viewed as a natural part of life and not something to be feared, despaired, or avoided. 3. Develop healthy interpersonal relationships that lead to the alleviation and help prevent the relapse of depression. 4. Develop healthy thinking patterns and beliefs about self, others, and the world that lead to the alleviation and help prevent the relapse of depression. 5. Recognize, accept, and cope with feelings of depression. Diagnosis F33.1  Conditions For Discharge Achievement of treatment goals and objectives   Willey Harrier, LCSW

## 2024-01-10 ENCOUNTER — Inpatient Hospital Stay: Payer: Medicare Other | Admitting: Internal Medicine

## 2024-01-10 ENCOUNTER — Ambulatory Visit (INDEPENDENT_AMBULATORY_CARE_PROVIDER_SITE_OTHER)

## 2024-01-10 VITALS — BP 114/72 | HR 99 | Temp 97.7°F | Resp 18 | Ht 64.0 in | Wt 144.0 lb

## 2024-01-10 VITALS — Ht 65.0 in | Wt 144.0 lb

## 2024-01-10 DIAGNOSIS — Z85118 Personal history of other malignant neoplasm of bronchus and lung: Secondary | ICD-10-CM | POA: Diagnosis not present

## 2024-01-10 DIAGNOSIS — Z85238 Personal history of other malignant neoplasm of thymus: Secondary | ICD-10-CM | POA: Diagnosis not present

## 2024-01-10 DIAGNOSIS — Z0000 Encounter for general adult medical examination without abnormal findings: Secondary | ICD-10-CM | POA: Diagnosis not present

## 2024-01-10 DIAGNOSIS — C3490 Malignant neoplasm of unspecified part of unspecified bronchus or lung: Secondary | ICD-10-CM | POA: Diagnosis not present

## 2024-01-10 NOTE — Patient Instructions (Signed)
 Sandra Craig , Thank you for taking time out of your busy schedule to complete your Annual Wellness Visit with me. I enjoyed our conversation and look forward to speaking with you again next year. I, as well as your care team,  appreciate your ongoing commitment to your health goals. Please review the following plan we discussed and let me know if I can assist you in the future. Your Game plan/ To Do List    Referrals: If you haven't heard from the office you've been referred to, please reach out to them at the phone provided.   Follow up Visits: Next Medicare AWV with our clinical staff: 01/14/25 Have you seen your provider in the last 6 months (3 months if uncontrolled diabetes)? Yes Next Office Visit with your provider: not scheduled at this time   Clinician Recommendations:  Each day, aim for 6 glasses of water , plenty of protein in your diet and try to get up and walk/ stretch every hour for 5-10 minutes at a time.        This is a list of the screening recommended for you and due dates:  Health Maintenance  Topic Date Due   Medicare Annual Wellness Visit  01/02/2024   COVID-19 Vaccine (5 - 2024-25 season) 01/21/2024*   Zoster (Shingles) Vaccine (2 of 2) 01/31/2024*   DTaP/Tdap/Td vaccine (2 - Tdap) 06/01/2024*   Flu Shot  03/01/2024   Mammogram  05/14/2024   Pneumonia Vaccine  Completed   DEXA scan (bone density measurement)  Completed   HPV Vaccine  Aged Out   Meningitis B Vaccine  Aged Out   Colon Cancer Screening  Discontinued   Hepatitis C Screening  Discontinued  *Topic was postponed. The date shown is not the original due date.    Advanced directives: (Copy Requested) Please bring a copy of your health care power of attorney and living will to the office to be added to your chart at your convenience. You can mail to [facility] [address] [place], [place] [address] or email to [email or web address] Advance Care Planning is important because  it:  [x]  Makes sure you receive the medical care that is consistent with your values, goals, and preferences  [x]  It provides guidance to your family and loved ones and reduces their decisional burden about whether or not they are making the right decisions based on your wishes.  Follow the link provided in your after visit summary or read over the paperwork we have mailed to you to help you started getting your Advance Directives in place. If you need assistance in completing these, please reach out to us  so that we can help you!  See attachments for Preventive Care and Fall Prevention Tips.

## 2024-01-10 NOTE — Progress Notes (Signed)
 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    PRIOR THERAPY:  1) status post surgical resection of the anterior mediastinal lymph node under the care of Dr. Luna Salinas on 08/05/22 2) Adjuvant radiation under the care of Dr. Jeryl Moris.   CURRENT THERAPY: Observation.    INTERVAL HISTORY: Sandra Craig 83 y.o. female returns to the clinic today for follow-up visit. Discussed the use of AI scribe software for clinical note transcription with the patient, who gave verbal consent to proceed.  History of Present Illness   Sandra Craig is an 83 year old female with stage III squamous cell carcinoma who presents for follow-up of a pulmonary nodule. She is accompanied by her husband, Sandra Craig.  She experiences shortness of breath upon exertion, which has impacted her ability to perform activities such as shopping. No other respiratory symptoms are present.  She has a history of stage III squamous cell carcinoma, previously involving the mediastinal lymph nodes. The cancer was initially thought to be thymic carcinoma but was later identified as squamous cell carcinoma. A recent scan showed a 4 mm nodule in the right lower lobe. Previous nodularity has improved.  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   Chronic insomnia    Chronic kidney disease    Cystitis    Depression    Diverticulitis of intestine without  perforation or abscess without bleeding    Patient did have abscess but noperforation   Diverticulosis of colon (without mention of hemorrhage)    Endometriosis    Family history of malignant neoplasm of gastrointestinal tract    Fibromyalgia    Gastritis    Hiatal hernia    HIATAL HERNIA 10/08/2008   Qualifier: Diagnosis of  By: Celestia Colander CMA Nonie Beady), Christine Cozier     History of gallstones    HSV-1 infection    Hyperlipidemia    Hypertension    IBS (irritable bowel syndrome)    IC (interstitial cystitis)    Internal hemorrhoid    Mycobacterium avium complex (HCC)    history- took antibiotics and completed course   Osteonecrosis (HCC)    Osteopenia    Osteoporosis    Palpitations    Polycystic kidney disease    pt denies   Pulmonary nodule 07/2007   5 mm Anterior RUL   Small bowel obstruction (HCC)    Stroke (HCC) 2021   TIA    ALLERGIES:  is allergic to azithromycin  and celecoxib.  MEDICATIONS:  Current Outpatient Medications  Medication Sig Dispense Refill   aspirin  81 MG tablet Take 1 tablet (81 mg total) by mouth daily. 30 tablet    atorvastatin  (LIPITOR) 40 MG tablet TAKE 1 TABLET(40 MG) BY MOUTH DAILY 90 tablet 3   Calcium  Citrate-Vitamin D  (CALCIUM  + D PO) Take 1 tablet by mouth 2 (two) times daily.     diazepam (VALIUM) 5 MG tablet Take 5 mg by mouth  daily.     DULoxetine  (CYMBALTA ) 30 MG capsule TAKE 2 CAPSULE BY MOUTH EVERY MORNING AND 1 CAPSULE BY MOUTH EVERY EVENING 270 capsule 3   famotidine  (PEPCID ) 20 MG tablet TAKE 1 TABLET(20 MG) BY MOUTH TWICE DAILY 180 tablet 1   gabapentin  (NEURONTIN ) 300 MG capsule TAKE 1 CAPSULE(300 MG) BY MOUTH TWICE DAILY AS NEEDED FOR PAIN 180 capsule 3   losartan  (COZAAR ) 25 MG tablet Take 12.5 mg by mouth daily.     methylPREDNISolone  (MEDROL  DOSEPAK) 4 MG TBPK tablet Take 4 mg by mouth as directed.     Multiple Vitamins-Minerals (MULTIVITAMIN WITH MINERALS) tablet Take 1 tablet by mouth daily.     Polyethyl Glycol-Propyl Glycol  (SYSTANE) 0.4-0.3 % GEL ophthalmic gel Place 1 application into both eyes 3 (three) times daily as needed (dry eye).     sucralfate  (CARAFATE ) 1 g tablet Take 1 tablet (1 g total) by mouth 4 (four) times daily. Dissolve each tablet in 15 cc water  before use. 120 tablet 2   zolpidem  (AMBIEN ) 5 MG tablet TAKE 1 TABLET BY MOUTH EVERY DAY AT BEDTIME FOR SLEEP 90 tablet 1   No current facility-administered medications for this visit.    SURGICAL HISTORY:  Past Surgical History:  Procedure Laterality Date   ABDOMINAL HYSTERECTOMY  1994   TAH,BSO FOR ENDOMETRIOSIS   ABDOMINAL SURGERY  2011   small intestine blockage   CHOLECYSTECTOMY     GASTROPLASTY  2011   small bowel resection -open   INGUINAL HERNIA REPAIR Right 02/28/2020   Procedure: LAPAROSCOPIC RIGHT INGUINAL HERNIA REPAIR WITH MESH;  Surgeon: Shela Derby, MD;  Location: [facility] OR;  Service: General;  Laterality: Right;   JOINT REPLACEMENT  2008   OOPHORECTOMY  1994   TAH,BSO   PELVIC LAPAROSCOPY     S/P right shoulder rotater cuff  200216/2011   Tear/adhesive capsulitis   SBO Lap  11   Adhesions and small internal hernia   SHOULDER HEMI-ARTHROPLASTY  06/05/2012   Procedure: SHOULDER HEMI-ARTHROPLASTY;  Surgeon: Neville Barbone, MD;  Location: [facility] OR;  Service: Orthopedics;  Laterality: Right;  FOR ARTHRITIS   TOTAL HIP ARTHROPLASTY  FALL OF 2008   rt. partial hip replacement   TOTAL SHOULDER ARTHROPLASTY  06/05/2012   Procedure: TOTAL SHOULDER ARTHROPLASTY;  Surgeon: Neville Barbone, MD;  Location: [facility] OR;  Service: Orthopedics;  Laterality: Right;  RIGHT SHOULDER TOTAL ARTHROPLASTY, HEMIARTHROPLASTY, SHOULDER, FOR ARTHRITIS   VIDEO BRONCHOSCOPY Bilateral 01/28/2015   Procedure: VIDEO BRONCHOSCOPY WITH FLUORO;  Surgeon: Denson Flake, MD;  Location: [facility] ENDOSCOPY;  Service: Cardiopulmonary;  Laterality: Bilateral;   VIDEO BRONCHOSCOPY Bilateral 04/23/2019   Procedure: VIDEO BRONCHOSCOPY WITHOUT FLUORO;  Surgeon: Denson Flake, MD;   Location: [facility] ENDOSCOPY;  Service: Cardiopulmonary;  Laterality: Bilateral;    REVIEW OF SYSTEMS:  A comprehensive review of systems was negative except for: Respiratory: positive for cough and sputum   PHYSICAL EXAMINATION: General appearance: alert, cooperative, and no distress Head: Normocephalic, without obvious abnormality, atraumatic Neck: no adenopathy, no JVD, supple, symmetrical, trachea midline, and thyroid  not enlarged, symmetric, no tenderness/mass/nodules Lymph nodes: Cervical, supraclavicular, and axillary nodes normal. Resp: clear to auscultation bilaterally Back: symmetric, no curvature. ROM normal. No CVA tenderness. Cardio: regular rate and rhythm, S1, S2 normal, no murmur, click, rub or gallop GI: soft, non-tender; bowel sounds normal; no masses,  no organomegaly Extremities: extremities normal, atraumatic, no cyanosis or edema  ECOG PERFORMANCE STATUS: 1 - Symptomatic but completely ambulatory  Blood pressure 114/72,  pulse 99, temperature 97.7 F (36.5 C), temperature source Tympanic, resp. rate 18, height 5' 4 (1.626 m), weight 144 lb (65.3 kg), last menstrual period 08/25/1992, SpO2 96%.  LABORATORY DATA: Lab Results  Component Value Date   WBC 5.7 01/05/2024   HGB 12.6 01/05/2024   HCT 38.3 01/05/2024   MCV 93.3 01/05/2024   PLT 242.0 01/05/2024      Chemistry      Component Value Date/Time   NA 141 01/05/2024 1137   NA 140 02/10/2022 0000   K 4.3 01/05/2024 1137   CL 101 01/05/2024 1137   CO2 33 (H) 01/05/2024 1137   BUN 32 (H) 01/05/2024 1137   BUN 24 (A) 02/10/2022 0000   CREATININE 1.97 (H) 01/05/2024 1137   CREATININE 1.82 (H) 01/01/2024 1048   CREATININE 1.61 (H) 07/21/2020 1559   GLU 90 02/10/2022 0000      Component Value Date/Time   CALCIUM  9.9 01/05/2024 1137   CALCIUM  10.3 12/21/2009 2250   ALKPHOS 63 01/05/2024 1137   AST 22 01/05/2024 1137   AST 18 01/01/2024 1048   ALT 23 01/05/2024 1137   ALT 19 01/01/2024 1048   BILITOT 0.4  01/05/2024 1137   BILITOT 0.3 01/01/2024 1048       RADIOGRAPHIC STUDIES: CT Chest Wo Contrast Result Date: 01/02/2024 CLINICAL DATA:  Non-small cell lung cancer, staging EXAM: CT CHEST WITHOUT CONTRAST TECHNIQUE: Multidetector CT imaging of the chest was performed following the standard protocol without IV contrast. RADIATION DOSE REDUCTION: This exam was performed according to the departmental dose-optimization program which includes automated exposure control, adjustment of the mA and/or kV according to patient size and/or use of iterative reconstruction technique. COMPARISON:  CT of the chest performed July 03 2023 FINDINGS: Cardiovascular: Normal size heart.  No pericardial effusion. Mediastinum/Nodes: Postsurgical changes from anterior mediastinal mass resection, similar. Lungs/Pleura: Radiation changes are present, similar. The previously observed periosteal or nodularity in the left upper lobe has decreased. The previously observed peripheral is nodule is no longer apparent. The largest individual nodule measures 9 mm x 4 mm which is improved. Previously observed foci of nodularity in the right upper lobe posteriorly are also improved. A subtle noncalcified nodules present in the right lower lobe measuring 4 mm, new when compared to July 03, 2023. Upper Abdomen: Cholecystectomy. Musculoskeletal: Right shoulder prosthesis. IMPRESSION: 1. The previously observed nodularity in the left upper lobe and right upper lobe are improved and were likely sequelae of prior infection. 2. Post radiation changes, similar. 3. New subtle nodule in the right lower lobe measuring 4 mm which may be infectious or inflammatory. Recommend attention to this area on follow-up imaging. Electronically Signed   By: Reagan Camera M.D.   On: 01/02/2024 06:35    ASSESSMENT AND PLAN: This is a very pleasant 83 years old white female with thymic carcinoma/stage IIIa (TX, N2, M0) squamous cell carcinoma of the lung with  mediastinal lymph node metastasis diagnosed in January 2024 status post surgical resection of the anterior mediastinal lymph node.  She is status post surgical resection of the anterior mediastinal lymph node under the care of Dr. Luna Salinas on 08/05/22.  She also underwent adjuvant radiotherapy under the care of Dr. Jeryl Moris completed in February 2024. She is currently on observation and feeling fine. Assessment and Plan    Stage 3 squamous cell carcinoma of the lung Previously staged as stage 3 due to mediastinal lymph node involvement, which has been removed. Current imaging shows no evidence of cancer.  Previous nodularity has improved, likely due to infection. Cancer is considered cured. Explained that the cancer was squamous cell carcinoma, possibly thymic or squamous cell carcinoma of the lung, but the exact classification is not critical as the cancer is resolved. - Follow-up in six months with repeat scan one week prior to the appointment.  Right lower lobe nodule Four millimeter nodule in the right lower lobe likely represents inflammation. No immediate intervention required. Plan to monitor with follow-up imaging. - Monitor with follow-up imaging in six months.  Shortness of breath on exertion Shortness of breath on exertion during activities such as shopping.   The patient was advised to call immediately if she has any concerning symptoms in the interval. The patient voices understanding of current disease status and treatment options and is in agreement with the current care plan.  All questions were answered. The patient knows to call the clinic with any problems, questions or concerns. We can certainly see the patient much sooner if necessary.  The total time spent in the appointment was 20 minutes.  Disclaimer: This note was dictated with voice recognition software. Similar sounding words can inadvertently be transcribed and may not be corrected upon review.

## 2024-01-10 NOTE — Progress Notes (Signed)
 Subjective:   Sandra Craig is a 83 y.o. who presents for a Medicare Wellness preventive visit.  As a reminder, Annual Wellness Visits don't include a physical exam, and some assessments may be limited, especially if this visit is performed virtually. We may recommend an in-person follow-up visit with your provider if needed.  Visit Complete: Virtual I connected with  Sandra Craig on 01/10/24 by a audio enabled telemedicine application and verified that I am speaking with the correct person using two identifiers.  Patient Location: Home  Provider Location: Home Office  I discussed the limitations of evaluation and management by telemedicine. The patient expressed understanding and agreed to proceed.  Vital Signs: Because this visit was a virtual/telehealth visit, some criteria may be missing or patient reported. Any vitals not documented were not able to be obtained and vitals that have been documented are patient reported.  VideoDeclined- This patient declined Librarian, academic. Therefore the visit was completed with audio only.  Persons Participating in Visit: Patient.  AWV Questionnaire: No: Patient Medicare AWV questionnaire was not completed prior to this visit.  Cardiac Risk Factors include: advanced age (>30men, >4 women);hypertension;dyslipidemia     Objective:     Today's Vitals   01/10/24 1344  Weight: 144 lb (65.3 kg)  Height: 5' 5 (1.651 m)   Body mass index is 23.96 kg/m.     01/10/2024    1:46 PM 01/02/2023    3:34 PM 08/18/2022    2:43 PM 08/03/2022    9:18 AM 07/03/2022   12:56 PM 12/21/2021   10:38 AM 01/03/2021   10:02 AM  Advanced Directives  Does Patient Have a Medical Advance Directive? Yes Yes Yes Yes No Yes Yes  Type of Estate agent of [place];Living will Healthcare Power of [place];Living will Living will Healthcare Power of [place];Living will  Healthcare Power of Attorney Living will   Does patient want to make changes to medical advance directive?       No - Patient declined  Copy of Healthcare Power of Attorney in Chart? No - copy requested No - copy requested No - copy requested No - copy requested  No - copy requested   Would patient like information on creating a medical advance directive?       No - Patient declined    Current Medications (verified) Outpatient Encounter Medications as of 01/10/2024  Medication Sig   aspirin  81 MG tablet Take 1 tablet (81 mg total) by mouth daily.   atorvastatin  (LIPITOR) 40 MG tablet TAKE 1 TABLET(40 MG) BY MOUTH DAILY   Calcium  Citrate-Vitamin D  (CALCIUM  + D PO) Take 1 tablet by mouth 2 (two) times daily.   DULoxetine  (CYMBALTA ) 30 MG capsule TAKE 2 CAPSULE BY MOUTH EVERY MORNING AND 1 CAPSULE BY MOUTH EVERY EVENING   famotidine  (PEPCID ) 20 MG tablet TAKE 1 TABLET(20 MG) BY MOUTH TWICE DAILY   gabapentin  (NEURONTIN ) 300 MG capsule TAKE 1 CAPSULE(300 MG) BY MOUTH TWICE DAILY AS NEEDED FOR PAIN   losartan  (COZAAR ) 25 MG tablet Take 12.5 mg by mouth daily.   Multiple Vitamins-Minerals (MULTIVITAMIN WITH MINERALS) tablet Take 1 tablet by mouth daily.   Polyethyl Glycol-Propyl Glycol (SYSTANE) 0.4-0.3 % GEL ophthalmic gel Place 1 application into both eyes 3 (three) times daily as needed (dry eye).   sucralfate  (CARAFATE ) 1 g tablet Take 1 tablet (1 g total) by mouth 4 (four) times daily. Dissolve each tablet in 15 cc water  before use.  zolpidem  (AMBIEN ) 5 MG tablet TAKE 1 TABLET BY MOUTH EVERY DAY AT BEDTIME FOR SLEEP   diazepam (VALIUM) 5 MG tablet Take 5 mg by mouth daily. (Patient not taking: Reported on 01/10/2024)   [DISCONTINUED] methylPREDNISolone  (MEDROL  DOSEPAK) 4 MG TBPK tablet Take 4 mg by mouth as directed.   No facility-administered encounter medications on file as of 01/10/2024.    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   Chronic insomnia    Chronic kidney disease    Cystitis    Depression    Diverticulitis of intestine without perforation or abscess without bleeding    Patient did have abscess but noperforation   Diverticulosis of colon (without mention of hemorrhage)    Endometriosis    Family history of malignant neoplasm of gastrointestinal tract    Fibromyalgia    Gastritis    Hiatal hernia    HIATAL HERNIA 10/08/2008   Qualifier: Diagnosis of  By: Celestia Colander CMA (AAMA), Christine Cozier     History of gallstones    HSV-1 infection    Hyperlipidemia    Hypertension    IBS (irritable bowel syndrome)    IC (interstitial cystitis)    Internal hemorrhoid    Mycobacterium avium complex (HCC)    history- took antibiotics and completed course   Osteonecrosis (HCC)    Osteopenia    Osteoporosis    Palpitations    Polycystic kidney disease    pt denies   Pulmonary nodule 07/2007   5 mm Anterior RUL   Small bowel obstruction (HCC)    Stroke (HCC) 2021   TIA   Past Surgical History:  Procedure Laterality Date   ABDOMINAL HYSTERECTOMY  1994   TAH,BSO FOR ENDOMETRIOSIS   ABDOMINAL SURGERY  2011   small intestine blockage   CHOLECYSTECTOMY     GASTROPLASTY  2011   small bowel resection -open   INGUINAL HERNIA REPAIR Right 02/28/2020   Procedure: LAPAROSCOPIC RIGHT INGUINAL HERNIA REPAIR WITH MESH;  Surgeon: Shela Derby, MD;  Location: [facility] OR;  Service: General;  Laterality: Right;   JOINT REPLACEMENT  2008   OOPHORECTOMY  1994   TAH,BSO   PELVIC LAPAROSCOPY     S/P right shoulder rotater cuff  200216/2011   Tear/adhesive capsulitis   SBO Lap  11   Adhesions and small internal hernia   SHOULDER HEMI-ARTHROPLASTY  06/05/2012   Procedure: SHOULDER HEMI-ARTHROPLASTY;  Surgeon: Neville Barbone, MD;  Location: [facility] OR;  Service: Orthopedics;  Laterality: Right;  FOR ARTHRITIS   TOTAL HIP ARTHROPLASTY  FALL OF 2008   rt. partial hip replacement   TOTAL  SHOULDER ARTHROPLASTY  06/05/2012   Procedure: TOTAL SHOULDER ARTHROPLASTY;  Surgeon: Neville Barbone, MD;  Location: [facility] OR;  Service: Orthopedics;  Laterality: Right;  RIGHT SHOULDER TOTAL ARTHROPLASTY, HEMIARTHROPLASTY, SHOULDER, FOR ARTHRITIS   VIDEO BRONCHOSCOPY Bilateral 01/28/2015   Procedure: VIDEO BRONCHOSCOPY WITH FLUORO;  Surgeon: Denson Flake, MD;  Location: [facility] ENDOSCOPY;  Service: Cardiopulmonary;  Laterality: Bilateral;   VIDEO BRONCHOSCOPY Bilateral 04/23/2019   Procedure: VIDEO BRONCHOSCOPY WITHOUT FLUORO;  Surgeon: Denson Flake, MD;  Location: [facility] ENDOSCOPY;  Service: Cardiopulmonary;  Laterality: Bilateral;   Family History  Problem Relation Age of Onset   Heart disease Mother        MI at age 67   Emphysema Mother    Thyroid  disease  Mother        Thyroidectomy/Benign   COPD Father    Pancreatic cancer Sister        died 8   Cancer Brother        adenocarcinoma right lung   COPD Brother    Lymphoma Maternal Grandmother    Colon cancer Paternal Grandmother    Heart attack Paternal Grandfather    Social History   Socioeconomic History   Marital status: Married    Spouse name: Not on file   Number of children: 0   Years of education: Not on file   Highest education level: Not on file  Occupational History   Occupation: Retired    Comment: Actuary  Tobacco Use   Smoking status: Former    Current packs/day: 0.00    Average packs/day: 0.8 packs/day for 8.0 years (6.0 ttl pk-yrs)    Types: Cigarettes    Start date: 08/01/1968    Quit date: 08/01/1976    Years since quitting: 47.4   Smokeless tobacco: Never   Tobacco comments:    Quit in 1978  Vaping Use   Vaping status: Never Used  Substance and Sexual Activity   Alcohol use: No    Alcohol/week: 0.0 standard drinks of alcohol   Drug use: No   Sexual activity: Not Currently    Partners: Male    Birth control/protection: Surgical    Comment: hysterectomy, older than 16, less than 5  Other  Topics Concern   Not on file  Social History Narrative   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 Married to Long term boyfriend 2 years prior 2021. Prior Widowed.       Enjoys reading    Social Drivers of Corporate investment banker Strain: Low Risk  (01/10/2024)   Overall Financial Resource Strain (CARDIA)    Difficulty of Paying Living Expenses: Not hard at all  Food Insecurity: No Food Insecurity (01/10/2024)   Hunger Vital Sign    Worried About Running Out of Food in the Last Year: Never true    Ran Out of Food in the Last Year: Never true  Transportation Needs: No Transportation Needs (01/10/2024)   PRAPARE - Administrator, Civil Service (Medical): No    Lack of Transportation (Non-Medical): No  Physical Activity: Inactive (01/10/2024)   Exercise Vital Sign    Days of Exercise per Week: 0 days    Minutes of Exercise per Session: 0 min  Stress: No Stress Concern Present (01/10/2024)   [employer] of Occupational Health - Occupational Stress Questionnaire    Feeling of Stress : Not at all  Social Connections: Moderately Integrated (01/10/2024)   Social Connection and Isolation Panel [NHANES]    Frequency of Communication with Friends and Family: More than three times a week    Frequency of Social Gatherings with Friends and Family: Never    Attends Religious Services: Never    Database administrator or Organizations: Yes    Attends Engineer, structural: More than 4 times per year    Marital Status: Married    Tobacco Counseling Counseling given: Not Answered Tobacco comments: Quit in 1978    Clinical Intake:  Pre-visit preparation completed: Yes  Pain : No/denies pain     BMI - recorded: 23.96 Nutritional Status: BMI of 19-24  Normal Nutritional Risks: None Diabetes: No  Lab Results  Component Value Date   HGBA1C 5.8 01/05/2024  HGBA1C 6.0 11/30/2022   HGBA1C 6.1 06/22/2022     How often do  you need to have someone help you when you read instructions, pamphlets, or other written materials from your doctor or pharmacy?: 1 - Never  Interpreter Needed?: No  Information entered by :: Lamont Pilsner, LPN   Activities of Daily Living      01/10/2024    1:45 PM  In your present state of health, do you have any difficulty performing the following activities:  Hearing? 0  Vision? 0  Difficulty concentrating or making decisions? 0  Walking or climbing stairs? 0  Dressing or bathing? 0  Doing errands, shopping? 0  Preparing Food and eating ? N  Using the Toilet? N  In the past six months, have you accidently leaked urine? N  Do you have problems with loss of bowel control? N  Managing your Medications? N  Managing your Finances? N  Housekeeping or managing your Housekeeping? N    Patient Care Team: Almira Jaeger, MD as PCP - General (Family Medicine) Baldwin Levee Delora Ferry, MD as Consulting Physician (Pulmonary Disease) Freddi Jaeger, NP (Inactive) as Nurse Practitioner (Obstetrics and Gynecology) Christophe Cram, MD as Referring Physician (Physical Medicine and Rehabilitation) Surgery, [facility] as Surgeon (General Surgery) Myrle Aspen, [facility] (Inactive) as Pharmacist (Pharmacist)   I have updated your Care Teams any recent Medical Services you may have received from other providers in the past year.     Assessment:    This is a routine wellness examination for Sandra Craig.  Hearing/Vision screen Hearing Screening - Comments:: Pt denies any hearing issues  Vision Screening - Comments:: Wears rx glasses - up to date with routine eye exams [facility]     Goals Addressed             This Visit's Progress    Patient Stated       Lose weight        Depression Screen      01/10/2024    1:48 PM 10/09/2023    3:04 PM 06/02/2023    2:08 PM 01/02/2023    3:35 PM 11/30/2022    8:52 AM 06/22/2022    9:59 AM 03/22/2022   11:01  AM  PHQ 2/9 Scores  PHQ - 2 Score 0 0 4 0 0 3 0  PHQ- 9 Score  0 10 0 4 4 1     Fall Risk      01/10/2024    1:51 PM 06/02/2023    2:07 PM 01/02/2023    3:34 PM 11/30/2022    8:51 AM 08/15/2022    3:58 PM  Fall Risk   Falls in the past year? 1 1 1 1  0  Number falls in past yr: 1 1  1  0  Injury with Fall? 1 1 1 1  0  Comment broke 3 ribs  cracked ribs    Risk for fall due to : History of fall(s) History of fall(s) Impaired vision;History of fall(s) History of fall(s);No Fall Risks No Fall Risks  Follow up Falls prevention discussed Falls evaluation completed Falls prevention discussed Falls evaluation completed Falls evaluation completed    MEDICARE RISK AT HOME:   Medicare Risk at Home Any stairs in or around the home?: Yes If so, are there any without handrails?: No Home free of loose throw rugs in walkways, pet beds, electrical cords, etc?: Yes Adequate lighting in your home to reduce risk of falls?:  Yes Life alert?: Yes Use of a cane, walker or w/c?: No Grab bars in the bathroom?: Yes Shower chair or bench in shower?: Yes Elevated toilet seat or a handicapped toilet?: No  TIMED UP AND GO:  Was the test performed?  No  Cognitive Function: 6CIT completed    07/26/2018    1:19 PM  MMSE - Mini Mental State Exam  Orientation to time 5  Orientation to Place 5  Registration 3  Attention/ Calculation 5  Recall 3  Language- name 2 objects 2  Language- repeat 1  Language- follow 3 step command 3  Language- read & follow direction 1  Write a sentence 1  Copy design 1  Total score 30        01/10/2024    1:53 PM 12/21/2021   10:41 AM  6CIT Screen  What Year? 0 points 0 points  What month? 0 points 0 points  What time? 0 points 0 points  Count back from 20 0 points 0 points  Months in reverse 0 points 0 points  Repeat phrase 0 points 0 points  Total Score 0 points 0 points    Immunizations Immunization History  Administered Date(s) Administered   Fluad Quad(high  Dose 65+) 04/02/2019, 04/05/2020, 05/06/2021   Fluad Trivalent(High Dose 65+) 06/02/2023   Influenza Split 05/23/2012   Influenza Whole 05/31/2006, 05/28/2007, 05/14/2008, 04/22/2009, 05/01/2010, 04/12/2011   Influenza, High Dose Seasonal PF 05/19/2013, 03/26/2014, 04/21/2016, 04/21/2017, 04/21/2018, 04/02/2019, 05/06/2022   Influenza,inj,Quad PF,6+ Mos 04/15/2015   Influenza-Unspecified 04/29/2018   Moderna Sars-Covid-2 Vaccination 04/14/2021   PFIZER(Purple Top)SARS-COV-2 Vaccination 09/07/2019, 09/27/2019, 05/27/2020   PNEUMOCOCCAL CONJUGATE-20 10/09/2023   Pneumococcal Conjugate-13 02/03/2015   Pneumococcal Polysaccharide-23 05/31/2006, 06/07/2012   Td 12/21/2009   Zoster Recombinant(Shingrix) 05/26/2022   Zoster, Live 02/06/2006    Screening Tests Health Maintenance  Topic Date Due   COVID-19 Vaccine (5 - 2024-25 season) 01/21/2024 (Originally 04/02/2023)   Zoster Vaccines- Shingrix (2 of 2) 01/31/2024 (Originally 07/21/2022)   DTaP/Tdap/Td (2 - Tdap) 06/01/2024 (Originally 12/22/2019)   INFLUENZA VACCINE  03/01/2024   MAMMOGRAM  05/14/2024   Medicare Annual Wellness (AWV)  01/09/2025   Pneumonia Vaccine 28+ Years old  Completed   DEXA SCAN  Completed   HPV VACCINES  Aged Out   Meningococcal B Vaccine  Aged Out   Colonoscopy  Discontinued   Hepatitis C Screening  Discontinued    Health Maintenance  There are no preventive care reminders to display for this patient.  Health Maintenance Items Addressed: See Nurse Notes at the end of this note  Additional Screening:  Vision Screening: Recommended annual ophthalmology exams for early detection of glaucoma and other disorders of the eye. Would you like a referral to an eye doctor? No    Dental Screening: Recommended annual dental exams for proper oral hygiene  Community Resource Referral / Chronic Care Management: CRR required this visit?  No   CCM required this visit?  No   Plan:    I have personally reviewed  and noted the following in the patient's chart:   Medical and social history Use of alcohol, tobacco or illicit drugs  Current medications and supplements including opioid prescriptions. Patient is not currently taking opioid prescriptions. Functional ability and status Nutritional status Physical activity Advanced directives List of other physicians Hospitalizations, surgeries, and ER visits in previous 12 months Vitals Screenings to include cognitive, depression, and falls Referrals and appointments  In addition, I have reviewed and discussed with patient certain preventive protocols,  quality metrics, and best practice recommendations. A written personalized care plan for preventive services as well as general preventive health recommendations were provided to patient.   Bruno Capri, LPN   1/61/0960   After Visit Summary: (MyChart) Due to this being a telephonic visit, the after visit summary with patients personalized plan was offered to patient via MyChart   Notes: Nothing significant to report at this time.

## 2024-01-16 ENCOUNTER — Telehealth: Payer: Self-pay | Admitting: *Deleted

## 2024-01-16 ENCOUNTER — Encounter: Payer: Self-pay | Admitting: Obstetrics and Gynecology

## 2024-01-16 ENCOUNTER — Ambulatory Visit (INDEPENDENT_AMBULATORY_CARE_PROVIDER_SITE_OTHER): Admitting: Obstetrics and Gynecology

## 2024-01-16 VITALS — BP 114/78 | HR 98 | Resp 16 | Ht 64.75 in | Wt 142.0 lb

## 2024-01-16 DIAGNOSIS — B009 Herpesviral infection, unspecified: Secondary | ICD-10-CM | POA: Diagnosis not present

## 2024-01-16 DIAGNOSIS — N952 Postmenopausal atrophic vaginitis: Secondary | ICD-10-CM

## 2024-01-16 DIAGNOSIS — Z1331 Encounter for screening for depression: Secondary | ICD-10-CM | POA: Diagnosis not present

## 2024-01-16 DIAGNOSIS — Z01419 Encounter for gynecological examination (general) (routine) without abnormal findings: Secondary | ICD-10-CM

## 2024-01-16 DIAGNOSIS — Z124 Encounter for screening for malignant neoplasm of cervix: Secondary | ICD-10-CM

## 2024-01-16 DIAGNOSIS — M810 Age-related osteoporosis without current pathological fracture: Secondary | ICD-10-CM | POA: Diagnosis not present

## 2024-01-16 DIAGNOSIS — Z9189 Other specified personal risk factors, not elsewhere classified: Secondary | ICD-10-CM

## 2024-01-16 DIAGNOSIS — M8000XA Age-related osteoporosis with current pathological fracture, unspecified site, initial encounter for fracture: Secondary | ICD-10-CM | POA: Diagnosis not present

## 2024-01-16 DIAGNOSIS — Z9289 Personal history of other medical treatment: Secondary | ICD-10-CM

## 2024-01-16 NOTE — Progress Notes (Signed)
 83 y.o. y.o. female here for annual exam. Patient's last menstrual period was 08/25/1992.    G1P0A1 Remarried x 4 years  RP:  Established patient presenting for annual gyn exam   HPI: S/P TAH/BSO for Endometriosis.  Postmenopause, well on no HRT.  No pelvic pain.  Not having intercourse, but having oral sex.  Husband Dxed with an HPV + Throat Ca a year ago. She was diagnosed with lung cancer in 1/24 and finished chemo.  Last Pap Neg in 2011.  Breasts normal.  Mammo Neg 03/2021.  BD Osteopenia with T-Score at -2.2 at the Lt Femoral Neck in 03/2021 on Prolia  stopped. Did chemo for cancer. Dxa 5/14 -1.9 LFN with positive frax score needs to go back on a bone support but GFR 23 repeat today 25 with normal calcium  and and Vit D.  Spoke with research rep for Evenity and Dr. Marguerita Shih with oncology.  Patient would like evenity and can do this followed by prolia .  Would need to watch for low calcium  with lower gfr but both are not renally excreted.  Patient notified.  Counseled on tymlos as well.  This could cause higher concentration of the medication and will need to be closely followed. Cannot have bone mets with tymlos use. Information given to patient.  She cannot do Fosamax with her reflux and is on two medications for this along with risks with low gfr should be avoided at all cost.  Some of patient fractures include: :2024 fell and broke 3 ribs, righ hip and shoulder fracture in 2008, she has been on long term steroids (injections in her back and now on prednisone for her hip pain.) Patient takes care of her husband with cancer and counseled on the mortality risks with fractures and loss of mobility would be detrimental to her.  Bone support strongly encouraged.    Body mass index is 23.81 kg/m.     01/10/2024    1:48 PM 10/09/2023    3:04 PM 06/02/2023    2:08 PM  Depression screen PHQ 2/9  Decreased Interest 0 0 2  Down, Depressed, Hopeless 0 0 2  PHQ - 2 Score 0 0 4  Altered sleeping  0 1   Tired, decreased energy  0 2  Change in appetite  0 0  Feeling bad or failure about yourself   0 2  Trouble concentrating  0 1  Moving slowly or fidgety/restless  0 0  Suicidal thoughts  0 0  PHQ-9 Score  0 10  Difficult doing work/chores  Not difficult at all Very difficult    Height 5' 4.75 (1.645 m), weight 142 lb (64.4 kg), last menstrual period 08/25/1992.     Component Value Date/Time   DIAGPAP  02/09/2022 1230    - Negative for intraepithelial lesion or malignancy (NILM)   ADEQPAP Satisfactory for evaluation. 02/09/2022 1230    GYN HISTORY:    Component Value Date/Time   DIAGPAP  02/09/2022 1230    - Negative for intraepithelial lesion or malignancy (NILM)   ADEQPAP Satisfactory for evaluation. 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   Chronic insomnia    Chronic kidney disease    Cystitis    Depression    Diverticulitis of intestine without perforation or abscess without bleeding    Patient did have abscess but noperforation   Diverticulosis of colon (without mention of hemorrhage)    Endometriosis    Family history of malignant neoplasm of gastrointestinal tract    Fibromyalgia    Gastritis    GERD (gastroesophageal reflux disease)    Hiatal hernia    HIATAL HERNIA 10/08/2008   Qualifier: Diagnosis of  By: Celestia Colander CMA (AAMA), Christine Cozier     History of gallstones    HSV-1 infection    Hyperlipidemia    Hypertension    IBS (irritable bowel syndrome)    IC (interstitial cystitis)    Internal hemorrhoid    Mycobacterium avium complex (HCC)    history- took antibiotics and completed course   Osteonecrosis (HCC)    Osteopenia    Osteoporosis    Palpitations    Polycystic  kidney disease    pt denies   Pulmonary nodule 07/2007   5 mm Anterior RUL   Small bowel obstruction (HCC)    Stroke (HCC) 2021   TIA    Past Surgical History:  Procedure Laterality Date   ABDOMINAL HYSTERECTOMY  08/01/1992   TAH,BSO FOR ENDOMETRIOSIS   ABDOMINAL SURGERY  08/01/2009   small intestine blockage   CHOLECYSTECTOMY     GASTROPLASTY  08/01/2009   small bowel resection -open   INGUINAL HERNIA REPAIR Right 02/28/2020   Procedure: LAPAROSCOPIC RIGHT INGUINAL HERNIA REPAIR WITH MESH;  Surgeon: Shela Derby, MD;  Location: [facility] OR;  Service: General;  Laterality: Right;   JOINT REPLACEMENT  08/01/2006   OOPHORECTOMY  08/01/1992   TAH,BSO   PELVIC LAPAROSCOPY     S/P right shoulder rotater cuff  200216/2011   Tear/adhesive capsulitis   SBO Lap  08/01/2009   Adhesions and small internal hernia   SHOULDER HEMI-ARTHROPLASTY  06/05/2012   Procedure: SHOULDER HEMI-ARTHROPLASTY;  Surgeon: Neville Barbone, MD;  Location: [facility] OR;  Service: Orthopedics;  Laterality: Right;  FOR ARTHRITIS   SMALL INTESTINE SURGERY     TOTAL HIP ARTHROPLASTY  FALL OF 2008   rt. partial hip replacement   TOTAL SHOULDER ARTHROPLASTY  06/05/2012   Procedure: TOTAL SHOULDER ARTHROPLASTY;  Surgeon: Neville Barbone, MD;  Location: [facility] OR;  Service: Orthopedics;  Laterality: Right;  RIGHT SHOULDER TOTAL ARTHROPLASTY, HEMIARTHROPLASTY, SHOULDER, FOR ARTHRITIS   VIDEO BRONCHOSCOPY Bilateral 01/28/2015   Procedure: VIDEO BRONCHOSCOPY WITH FLUORO;  Surgeon: Denson Flake, MD;  Location[facility] ENDOSCOPY;  Service: Cardiopulmonary;  Laterality: Bilateral;   VIDEO BRONCHOSCOPY Bilateral 04/23/2019   Procedure: VIDEO BRONCHOSCOPY WITHOUT FLUORO;  Surgeon: Denson Flake, MD;  Location: [facility] ENDOSCOPY;  Service: Cardiopulmonary;  Laterality: Bilateral;    Current Outpatient Medications on File Prior to Visit  Medication Sig Dispense Refill   aspirin  81 MG tablet Take 1 tablet (81 mg total) by mouth daily. 30 tablet     atorvastatin  (LIPITOR) 40 MG tablet TAKE 1 TABLET(40 MG) BY MOUTH DAILY 90 tablet 3   Calcium  Citrate-Vitamin D  (CALCIUM  + D PO) Take 1 tablet by mouth 2 (two) times daily.     DULoxetine  (CYMBALTA ) 30 MG capsule TAKE 2 CAPSULE BY MOUTH EVERY MORNING AND 1 CAPSULE BY MOUTH EVERY EVENING 270 capsule 3   famotidine  (PEPCID ) 20 MG tablet TAKE 1 TABLET(20 MG)  BY MOUTH TWICE DAILY 180 tablet 1   gabapentin  (NEURONTIN ) 300 MG capsule TAKE 1 CAPSULE(300 MG) BY MOUTH TWICE DAILY AS NEEDED FOR PAIN 180 capsule 3   losartan  (COZAAR ) 25 MG tablet Take 12.5 mg by mouth daily.     Multiple Vitamins-Minerals (MULTIVITAMIN WITH MINERALS) tablet Take 1 tablet by mouth daily.     Polyethyl Glycol-Propyl Glycol (SYSTANE) 0.4-0.3 % GEL ophthalmic gel Place 1 application into both eyes 3 (three) times daily as needed (dry eye).     sucralfate  (CARAFATE ) 1 g tablet Take 1 tablet (1 g total) by mouth 4 (four) times daily. Dissolve each tablet in 15 cc water  before use. (Patient taking differently: Take 1 g by mouth as needed. Dissolve each tablet in 15 cc water  before use.) 120 tablet 2   zolpidem  (AMBIEN ) 5 MG tablet TAKE 1 TABLET BY MOUTH EVERY DAY AT BEDTIME FOR SLEEP 90 tablet 1   No current facility-administered medications on file prior to visit.    Social History   Socioeconomic History   Marital status: Married    Spouse name: Not on file   Number of children: 0   Years of education: Not on file   Highest education level: Not on file  Occupational History   Occupation: Retired    Comment: Actuary  Tobacco Use   Smoking status: Former    Current packs/day: 0.00    Average packs/day: 0.8 packs/day for 8.0 years (6.0 ttl pk-yrs)    Types: Cigarettes    Start date: 08/01/1968    Quit date: 08/01/1976    Years since quitting: 47.4   Smokeless tobacco: Never   Tobacco comments:    Quit in 1978  Vaping Use   Vaping status: Never Used  Substance and Sexual Activity   Alcohol use: No     Alcohol/week: 0.0 standard drinks of alcohol   Drug use: No   Sexual activity: Not Currently    Partners: Male    Birth control/protection: Surgical    Comment: hysterectomy, older than 16, less than 5  Other Topics Concern   Not on file  Social History Narrative   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 Married to Long term boyfriend 2 years prior 2021. Prior Widowed.       Enjoys reading    Social Drivers of Corporate investment banker Strain: Low Risk  (01/10/2024)   Overall Financial Resource Strain (CARDIA)    Difficulty of Paying Living Expenses: Not hard at all  Food Insecurity: No Food Insecurity (01/10/2024)   Hunger Vital Sign    Worried About Running Out of Food in the Last Year: Never true    Ran Out of Food in the Last Year: Never true  Transportation Needs: No Transportation Needs (01/10/2024)   PRAPARE - Administrator, Civil Service (Medical): No    Lack of Transportation (Non-Medical): No  Physical Activity: Inactive (01/10/2024)   Exercise Vital Sign    Days of Exercise per Week: 0 days    Minutes of Exercise per Session: 0 min  Stress: No Stress Concern Present (01/10/2024)   [employer] of Occupational Health - Occupational Stress Questionnaire    Feeling of Stress : Not at all  Social Connections: Moderately Integrated (01/10/2024)   Social Connection and Isolation Panel    Frequency of Communication with Friends and Family: More than three times a week    Frequency of Social Gatherings with Friends and  Family: Never    Attends Religious Services: Never    Active Member of Clubs or Organizations: Yes    Attends Engineer, structural: More than 4 times per year    Marital Status: Married  Catering manager Violence: Not At Risk (01/10/2024)   Humiliation, Afraid, Rape, and Kick questionnaire    Fear of Current or Ex-Partner: No    Emotionally Abused: No    Physically Abused: No    Sexually Abused:  No    Family History  Problem Relation Age of Onset   Heart disease Mother        MI at age 86   Emphysema Mother    Thyroid  disease Mother        Thyroidectomy/Benign   COPD Father    Pancreatic cancer Sister        died 45   Cancer Sister    Cancer Brother        adenocarcinoma right lung   COPD Brother    Lymphoma Maternal Grandmother    Colon cancer Paternal Grandmother    Heart attack Paternal Grandfather      Allergies  Allergen Reactions   Azithromycin  Rash    Pruritic rash diffuse     Celecoxib Nausea Only      Patient's last menstrual period was Patient's last menstrual period was 08/25/1992.Sandra Craig             Review of Systems Alls systems reviewed and are negative.     Physical Exam Constitutional:      Appearance: Normal appearance.  Genitourinary:     Vulva and urethral meatus normal.     No lesions in the vagina.     Right Labia: No rash, lesions or skin changes.    Left Labia: No lesions, skin changes or rash.    No vaginal discharge or tenderness.     No vaginal prolapse present.    Moderate vaginal atrophy present.     Right Adnexa: not tender, not palpable and no mass present.    Left Adnexa: not tender, not palpable and no mass present.    No cervical motion tenderness or discharge.     Uterus is not enlarged, tender or irregular.  Breasts:    Right: Normal.     Left: Normal.  HENT:     Head: Normocephalic.  Neck:     Thyroid : No thyroid  mass, thyromegaly or thyroid  tenderness.   Cardiovascular:     Rate and Rhythm: Normal rate and regular rhythm.     Heart sounds: Normal heart sounds, S1 normal and S2 normal.  Pulmonary:     Effort: Pulmonary effort is normal.     Breath sounds: Normal breath sounds and air entry.  Abdominal:     General: There is no distension.     Palpations: Abdomen is soft. There is no mass.     Tenderness: There is no abdominal tenderness. There is no guarding or rebound.   Musculoskeletal:         General: Normal range of motion.     Cervical back: Full passive range of motion without pain, normal range of motion and neck supple. No tenderness.     Right lower leg: No edema.     Left lower leg: No edema.   Neurological:     Mental Status: She is alert.   Skin:    General: Skin is warm.   Psychiatric:        Mood and Affect: Mood normal.  Behavior: Behavior normal.        Thought Content: Thought content normal.  Vitals and nursing note reviewed. Exam conducted with a chaperone present.    Terra CMA was present for exam   A:         Well Woman GYN exam                             P:        Pap smear collected today Encouraged annual mammogram screening Colon cancer screening up-to-date DXA up-to-date Labs and immunizations to do with PMD Discussed breast self exams Encouraged healthy lifestyle practices Encouraged Vit D and Calcium    No follow-ups on file.  Reinaldo Caras

## 2024-01-16 NOTE — Telephone Encounter (Signed)
 Patient was waiting for updated BMD. Last prolia  injection was in 2022. Not currently in Evenity or Prolia  log.

## 2024-01-16 NOTE — Telephone Encounter (Signed)
-----   Message from Reinaldo Caras sent at 01/16/2024  3:56 PM EDT ----- We need to pull from [place] for evenity/prolia  if she has one. GFR is 23  Will need to do tymlos Thank you  Dr. Tia Flowers

## 2024-01-17 ENCOUNTER — Other Ambulatory Visit (HOSPITAL_COMMUNITY)
Admission: RE | Admit: 2024-01-17 | Discharge: 2024-01-17 | Disposition: A | Discharge status: Home / Self Care | Source: Ambulatory Visit | Attending: Obstetrics and Gynecology | Admitting: Obstetrics and Gynecology

## 2024-01-17 ENCOUNTER — Ambulatory Visit: Payer: Self-pay | Admitting: Obstetrics and Gynecology

## 2024-01-17 DIAGNOSIS — Z01419 Encounter for gynecological examination (general) (routine) without abnormal findings: Secondary | ICD-10-CM | POA: Insufficient documentation

## 2024-01-17 LAB — VITAMIN D 25 HYDROXY (VIT D DEFICIENCY, FRACTURES): Vit D, 25-Hydroxy: 51 ng/mL (ref 30–100)

## 2024-01-17 LAB — COMPREHENSIVE METABOLIC PANEL WITH GFR
AG Ratio: 1.6 (calc) (ref 1.0–2.5)
ALT: 23 U/L (ref 6–29)
AST: 20 U/L (ref 10–35)
Albumin: 4 g/dL (ref 3.6–5.1)
Alkaline phosphatase (APISO): 70 U/L (ref 37–153)
BUN/Creatinine Ratio: 16 (calc) (ref 6–22)
BUN: 31 mg/dL — ABNORMAL HIGH (ref 7–25)
CO2: 26 mmol/L (ref 20–32)
Calcium: 9.8 mg/dL (ref 8.6–10.4)
Chloride: 103 mmol/L (ref 98–110)
Creat: 1.98 mg/dL — ABNORMAL HIGH (ref 0.60–0.95)
Globulin: 2.5 g/dL (ref 1.9–3.7)
Glucose, Bld: 88 mg/dL (ref 65–99)
Potassium: 4.4 mmol/L (ref 3.5–5.3)
Sodium: 140 mmol/L (ref 135–146)
Total Bilirubin: 0.5 mg/dL (ref 0.2–1.2)
Total Protein: 6.5 g/dL (ref 6.1–8.1)
eGFR: 25 mL/min/{1.73_m2} — ABNORMAL LOW (ref >=60)

## 2024-01-17 MED ORDER — ROMOSOZUMAB-AQQG 105 MG/1.17ML ~~LOC~~ SOSY: 210.0000 mg | PREFILLED_SYRINGE | Freq: Once | SUBCUTANEOUS | Status: AC

## 2024-01-17 NOTE — Telephone Encounter (Signed)
 BMD on file in EPIC

## 2024-01-19 LAB — CYTOLOGY - PAP: Diagnosis: NEGATIVE

## 2024-01-23 ENCOUNTER — Ambulatory Visit: Admitting: Thoracic Surgery (Cardiothoracic Vascular Surgery)

## 2024-01-25 DIAGNOSIS — C37 Malignant neoplasm of thymus: Secondary | ICD-10-CM | POA: Diagnosis not present

## 2024-01-25 DIAGNOSIS — J471 Bronchiectasis with (acute) exacerbation: Secondary | ICD-10-CM | POA: Diagnosis not present

## 2024-01-25 DIAGNOSIS — R053 Chronic cough: Secondary | ICD-10-CM | POA: Diagnosis not present

## 2024-01-25 DIAGNOSIS — A310 Pulmonary mycobacterial infection: Secondary | ICD-10-CM | POA: Diagnosis not present

## 2024-01-31 DIAGNOSIS — M25552 Pain in left hip: Secondary | ICD-10-CM | POA: Diagnosis not present

## 2024-02-06 ENCOUNTER — Ambulatory Visit (INDEPENDENT_AMBULATORY_CARE_PROVIDER_SITE_OTHER): Admitting: Psychology

## 2024-02-06 DIAGNOSIS — F331 Major depressive disorder, recurrent, moderate: Secondary | ICD-10-CM | POA: Diagnosis not present

## 2024-02-06 NOTE — Progress Notes (Signed)
 [facility] Counselor/Therapist Progress Note  Patient ID: Sandra GATLIFF, MRN: [id number],    Date: 02/06/2024  Time Spent: 2:00pm-2:45pm   45 minutes   Treatment Type: Individual Therapy  Reported Symptoms: stress  Mental Status Exam: Appearance:  Casual     Behavior: Appropriate  Motor: Normal  Speech/Language:  Normal Rate  Affect: Appropriate  Mood: normal  Thought process: normal  Thought content:   WNL  Sensory/Perceptual disturbances:   WNL  Orientation: oriented to person, place, time/date, and situation  Attention: Good  Concentration: Good  Memory: WNL  Fund of knowledge:  Good  Insight:   Good  Judgment:  Good  Impulse Control: Good   Risk Assessment: Danger to Self:  No Self-injurious Behavior: No Danger to Others: No Duty to Warn:no Physical Aggression / Violence:No  Access to Firearms a concern: No  Gang Involvement:No   Subjective: Pt present for face-to-face individual therapy via video.  Pt consents to telehealth video session and is aware of limitations and benefits of virtual sessions.  Location of pt: home Location of therapist: home office.   Pt talked about her health.  She was diagnosed with non small cell lung cancer.  Her doctors are watching a nodule in her lung.   Pt was told she does not need treatment at this time.   Pt is worried about the cancer since talking with her doctors.  She wants to request a PET scan to get more answers.   She has been feeling more fatigued.  Pt is still having issues with her hip.  Addressed how pt can get the medical care she needs and made a plan for her to contact Dr. Katrinka.   Pt talked about [name].   He is getting worse with symptoms of neuropathy.   Pt states it is very stressful bc he expects her to be at his beckon call.  Addressed how pt can advocate for herself and work with Charlie to be willing to have help come in the home.   Pt continues to feel disappointment with her  marriage but is coping by trying to accept the way Charlie is and getting her needs met by making more social connections with friends.  Pt spent some time with her sister who had to have an emergency appendectomy.  They had some good time together and bonded.  Worked on self care strategies. Provided supportive therapy.    Interventions: Cognitive Behavioral Therapy and Insight-Oriented  Diagnosis: F33.1   Plan of Care: Recommend ongoing therapy.   Pt participated in setting treatment goals.  Pt wants to have someone to talk to and to improve coping skills.  Pt wants to feel less anxious and depressed.  Plan to meet monthly.    Pt agrees with treatment plan.     Treatment Plan (Treatment Plan Target Date: 11/08/2024) Client Abilities/Strengths  Pt is bright, engaging, and motivated for therapy.   Client Treatment Preferences  Individual therapy.  Client Statement of Needs  Improve coping skills.  Symptoms  Depressed or irritable mood. Diminished interest in or enjoyment of activities. Lack of energy. Feelings of hopelessness, worthlessness, or inappropriate guilt. Unresolved grief issues.  Problems Addressed  Unipolar Depression Goals 1. Alleviate depressive symptoms and return to previous level of effective functioning. 2. Appropriately grieve the loss in order to normalize mood and to return to previously adaptive level of functioning. Objective Learn and implement behavioral strategies to overcome depression. Target Date: 2024-11-08 Frequency: Monthly  Progress:  40 Modality: individual  Related Interventions Engage the client in behavioral activation, increasing his/her activity level and contact with sources of reward, while identifying processes that inhibit activation.  Use behavioral techniques such as instruction, rehearsal, role-playing, role reversal, as needed, to facilitate activity in the client's daily life; reinforce success. Assist the client in developing skills  that increase the likelihood of deriving pleasure from behavioral activation (e.g., assertiveness skills, developing an exercise plan, less internal/more external focus, increased social involvement); reinforce success. Objective Identify important people in life, past and present, and describe the quality, good and poor, of those relationships. Target Date: 2024-11-08 Frequency:Monthly  Progress: 40 Modality: individual  Related Interventions Conduct Interpersonal Therapy beginning with the assessment of the client's interpersonal inventory of important past and present relationships; develop a case formulation linking depression to grief, interpersonal role disputes, role transitions, and/or interpersonal deficits). Objective Learn and implement problem-solving and decision-making skills. Target Date: 2024-11-08 Frequency: Monthly  Progress: 40 Modality: individual  Related Interventions Conduct Problem-Solving Therapy using techniques such as psychoeducation, modeling, and role-playing to teach client problem-solving skills (i.e., defining a problem specifically, generating possible solutions, evaluating the pros and cons of each solution, selecting and implementing a plan of action, evaluating the efficacy of the plan, accepting or revising the plan); role-play application of the problem-solving skill to a real life issue. Encourage in the client the development of a positive problem orientation in which problems and solving them are viewed as a natural part of life and not something to be feared, despaired, or avoided. 3. Develop healthy interpersonal relationships that lead to the alleviation and help prevent the relapse of depression. 4. Develop healthy thinking patterns and beliefs about self, others, and the world that lead to the alleviation and help prevent the relapse of depression. 5. Recognize, accept, and cope with feelings of depression. Diagnosis F33.1  Conditions For  Discharge Achievement of treatment goals and objectives   [name], LCSW

## 2024-02-13 ENCOUNTER — Ambulatory Visit: Admitting: Thoracic Surgery (Cardiothoracic Vascular Surgery)

## 2024-02-15 ENCOUNTER — Encounter: Payer: Self-pay | Admitting: Family Medicine

## 2024-02-15 ENCOUNTER — Ambulatory Visit: Admitting: Family Medicine

## 2024-02-15 ENCOUNTER — Telehealth (INDEPENDENT_AMBULATORY_CARE_PROVIDER_SITE_OTHER): Admitting: Family Medicine

## 2024-02-15 VITALS — Ht 64.75 in | Wt 140.0 lb

## 2024-02-15 DIAGNOSIS — R911 Solitary pulmonary nodule: Secondary | ICD-10-CM

## 2024-02-15 NOTE — Patient Instructions (Signed)
 Health Maintenance Due  Topic Date Due   Zoster Vaccines- Shingrix (2 of 2) 07/21/2022    Recommended follow up: No follow-ups on file.

## 2024-02-15 NOTE — Progress Notes (Signed)
 Phone [phone] Virtual visit via Video note   Subjective:  Chief complaint: Chief Complaint  Patient presents with   discuss CT scan results   Our team/I connected with Sandra Craig at 10:00 AM EDT by a video enabled telemedicine application (caregility through epic) and verified that I am speaking with the correct person using two identifiers. Our team/I discussed the limitations of evaluation and management by telemedicine and the availability of in person appointments.No physical exam was performed (except for noted visual exam or audio findings with Telehealth visits).   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    Priority: High   Chronic low back pain 12/20/2013    Priority: High   Fibromyalgia 12/05/2007    Priority: High   UTI (urinary tract infection) 12/15/2018    Priority: Medium    Chronic kidney disease, stage 3b (HCC) 06/02/2017    Priority: Medium    Near syncope 03/11/2017    Priority: Medium    Hyperglycemia 06/27/2012    Priority: Medium    History of small bowel obstruction 06/28/2011    Priority: Medium    GERD with stricture 06/28/2011    Priority: Medium    Hypertension     Priority: Medium    Hyperlipidemia     Priority: Medium    INSOMNIA, CHRONIC 10/08/2008    Priority: Medium    Depression 02/08/2008    Priority: Medium    Facet arthropathy 09/14/2019    Priority: Low   Vaginal dryness 01/29/2019    Priority: Low   Chronic right shoulder pain 03/07/2014    Priority: Low   Benign essential tremor 10/09/2013    Priority: Low   Diverticulitis large intestine 02/11/2012     Priority: Low   Irritable bowel syndrome (IBS) 06/28/2011    Priority: Low   IC (interstitial cystitis)     Priority: Low   Hemorrhoids 10/08/2008    Priority: Low   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 30 tablet    atorvastatin  (LIPITOR) 40 MG tablet TAKE 1 TABLET(40 MG) BY MOUTH DAILY 90 tablet 3   Calcium  Citrate-Vitamin D  (CALCIUM  + D PO) Take 1 tablet by mouth 2 (two) times daily.     DULoxetine  (CYMBALTA ) 30 MG capsule TAKE 2 CAPSULE BY MOUTH EVERY MORNING AND 1 CAPSULE BY MOUTH EVERY EVENING 270 capsule 3   famotidine  (PEPCID ) 20 MG tablet TAKE 1 TABLET(20 MG) BY MOUTH TWICE DAILY 180 tablet 1   gabapentin  (NEURONTIN ) 300 MG capsule TAKE 1 CAPSULE(300 MG) BY MOUTH TWICE DAILY AS NEEDED FOR PAIN 180 capsule 3   losartan  (COZAAR ) 25 MG tablet Take 12.5 mg by mouth daily.     Multiple Vitamins-Minerals (MULTIVITAMIN WITH  MINERALS) tablet Take 1 tablet by mouth daily.     Polyethyl Glycol-Propyl Glycol (SYSTANE) 0.4-0.3 % GEL ophthalmic gel Place 1 application into both eyes 3 (three) times daily as needed (dry eye).     sucralfate  (CARAFATE ) 1 g tablet Take 1 tablet (1 g total) by mouth 4 (four) times daily. Dissolve each tablet in 15 cc water  before use. 120 tablet 2   zolpidem  (AMBIEN ) 5 MG tablet TAKE 1 TABLET BY MOUTH EVERY DAY AT BEDTIME FOR SLEEP 90 tablet 1   Current Facility-Administered Medications  Medication Dose Route Frequency Provider Last Rate Last Admin   Romosozumab -aqqg (EVENITY ) 105 MG/1. injection 210 mg  210 mg Subcutaneous Once           Objective:  Ht 5' 4.75 (1.645 m)   Wt 140 lb (63.5 kg)   LMP 08/25/1992   BMI 23.48 kg/m  self reported vitals Gen: NAD, resting comfortably Lungs: nonlabored, normal respiratory rate  Skin: appears dry, no obvious rash     Assessment and Plan   # Pulmonary nodule S: Patient's most recent CT scan 01-01-24 showed clearance of prior pulmonary nodular area in the left upper lobe and right upper lobe-thought to likely be related to infection.  She also displayed postradiation changes -There was a very small subtle 4 mm pulmonary nodule in the right lower lobe A/P: With patient's cancer history she was very concerned about this-we reviewed note from Dr. Gatha with plan for 53-month follow-up.  She asks about CT scan versus PET scan and we discussed with this size CT scan would likely be more beneficial.  I thought the risk of malignancy was low-likely infectious or inflammatory-as per radiology -Will have imaging repeat in 6 months and I told her I think that is a reasonable timeframe -Did discuss warning signs such as hemoptysis, unintentional weight loss, cough, increased shortness of breath as indications for sooner evaluation- she will reach out to me or oncology if that were to occur. Also sees pulmonary Dr. McQuaid who could weigh in  Recommended follow up:  no regular follow up for this issue- return to prior recommended schedule Future Appointments  Date Time Provider Department Center  03/05/2024  2:00 PM Valdemar Veva ORN, LCSW [facility] None  03/05/2024  3:45 PM Kerrin Elspeth BROCKS, MD [facility]  04/04/2024  2:00 PM Bauert, Veva ORN, LCSW [facility] None  07/02/2024  9:30 AM [facility] None  07/10/2024  3:15 PM Sherrod Sherrod, MD [facility] None  01/14/2025  1:40 PM [facility] ANNUAL WELLNESS VISIT 1 [facility]    Lab/Order associations:   ICD-10-CM   1. Pulmonary nodule seen on imaging study  R91.1      Return precautions advised.  Garnette Lukes, MD

## 2024-02-17 ENCOUNTER — Other Ambulatory Visit: Payer: Self-pay | Admitting: Family Medicine

## 2024-02-22 DIAGNOSIS — K080 Exfoliation of teeth due to systemic causes: Secondary | ICD-10-CM | POA: Diagnosis not present

## 2024-03-05 ENCOUNTER — Ambulatory Visit: Admitting: Thoracic Surgery (Cardiothoracic Vascular Surgery)

## 2024-03-05 ENCOUNTER — Ambulatory Visit (INDEPENDENT_AMBULATORY_CARE_PROVIDER_SITE_OTHER): Admitting: Psychology

## 2024-03-05 ENCOUNTER — Encounter: Payer: Self-pay | Admitting: Thoracic Surgery (Cardiothoracic Vascular Surgery)

## 2024-03-05 DIAGNOSIS — F331 Major depressive disorder, recurrent, moderate: Secondary | ICD-10-CM | POA: Diagnosis not present

## 2024-03-05 NOTE — Progress Notes (Signed)
 [facility] Counselor/Therapist Progress Note  Patient ID: Sandra Craig, MRN: [id number],    Date: 03/05/2024  Time Spent: 2:00pm-2:45pm   45 minutes   Treatment Type: Individual Therapy  Reported Symptoms: stress  Mental Status Exam: Appearance:  Casual     Behavior: Appropriate  Motor: Normal  Speech/Language:  Normal Rate  Affect: Appropriate  Mood: normal  Thought process: normal  Thought content:   WNL  Sensory/Perceptual disturbances:   WNL  Orientation: oriented to person, place, time/date, and situation  Attention: Good  Concentration: Good  Memory: WNL  Fund of knowledge:  Good  Insight:   Good  Judgment:  Good  Impulse Control: Good   Risk Assessment: Danger to Self:  No Self-injurious Behavior: No Danger to Others: No Duty to Warn:no Physical Aggression / Violence:No  Access to Firearms a concern: No  Gang Involvement:No   Subjective: Pt present for face-to-face individual therapy via video.  Pt consents to telehealth video session and is aware of limitations and benefits of virtual sessions.  Location of pt: home Location of therapist: home office.   Pt talked about Charlie being in the hospital.  He asperates when he eats.  The chemo treatment Charlie was on has not worked.  The cancer has been growing.  He is on a different chemo treatment that is very rough for him physically.  [name] may not continue chemo and if he doesn't he may have less than a year to live.   Pt will need to do a lot more caregiving.  She is worried about the care giving and what the next few months may be like.  Pt talked about her health.   She has been feeling fatigued.  Pt is still having issues with her hip.   Worked on self care strategies. Provided supportive therapy.    Interventions: Cognitive Behavioral Therapy and Insight-Oriented  Diagnosis: F33.1   Plan of Care: Recommend ongoing therapy.   Pt participated in setting treatment goals.  Pt wants to  have someone to talk to and to improve coping skills.  Pt wants to feel less anxious and depressed.  Plan to meet monthly.    Pt agrees with treatment plan.     Treatment Plan (Treatment Plan Target Date: 11/08/2024) Client Abilities/Strengths  Pt is bright, engaging, and motivated for therapy.   Client Treatment Preferences  Individual therapy.  Client Statement of Needs  Improve coping skills.  Symptoms  Depressed or irritable mood. Diminished interest in or enjoyment of activities. Lack of energy. Feelings of hopelessness, worthlessness, or inappropriate guilt. Unresolved grief issues.  Problems Addressed  Unipolar Depression Goals 1. Alleviate depressive symptoms and return to previous level of effective functioning. 2. Appropriately grieve the loss in order to normalize mood and to return to previously adaptive level of functioning. Objective Learn and implement behavioral strategies to overcome depression. Target Date: 2024-11-08 Frequency: Monthly  Progress: 40 Modality: individual  Related Interventions Engage the client in behavioral activation, increasing his/her activity level and contact with sources of reward, while identifying processes that inhibit activation.  Use behavioral techniques such as instruction, rehearsal, role-playing, role reversal, as needed, to facilitate activity in the client's daily life; reinforce success. Assist the client in developing skills that increase the likelihood of deriving pleasure from behavioral activation (e.g., assertiveness skills, developing an exercise plan, less internal/more external focus, increased social involvement); reinforce success. Objective Identify important people in life, past and present, and describe the quality, good and poor, of those  relationships. Target Date: 2024-11-08 Frequency:Monthly  Progress: 40 Modality: individual  Related Interventions Conduct Interpersonal Therapy beginning with the assessment of  the client's interpersonal inventory of important past and present relationships; develop a case formulation linking depression to grief, interpersonal role disputes, role transitions, and/or interpersonal deficits). Objective Learn and implement problem-solving and decision-making skills. Target Date: 2024-11-08 Frequency: Monthly  Progress: 40 Modality: individual  Related Interventions Conduct Problem-Solving Therapy using techniques such as psychoeducation, modeling, and role-playing to teach client problem-solving skills (i.e., defining a problem specifically, generating possible solutions, evaluating the pros and cons of each solution, selecting and implementing a plan of action, evaluating the efficacy of the plan, accepting or revising the plan); role-play application of the problem-solving skill to a real life issue. Encourage in the client the development of a positive problem orientation in which problems and solving them are viewed as a natural part of life and not something to be feared, despaired, or avoided. 3. Develop healthy interpersonal relationships that lead to the alleviation and help prevent the relapse of depression. 4. Develop healthy thinking patterns and beliefs about self, others, and the world that lead to the alleviation and help prevent the relapse of depression. 5. Recognize, accept, and cope with feelings of depression. Diagnosis F33.1  Conditions For Discharge Achievement of treatment goals and objectives   Sandra Alma, LCSW

## 2024-03-18 ENCOUNTER — Ambulatory Visit (INDEPENDENT_AMBULATORY_CARE_PROVIDER_SITE_OTHER): Admitting: Psychology

## 2024-03-18 DIAGNOSIS — F331 Major depressive disorder, recurrent, moderate: Secondary | ICD-10-CM

## 2024-03-18 NOTE — Progress Notes (Signed)
 [facility] Counselor/Therapist Progress Note  Patient ID: Sandra Craig, MRN: [id number],    Date: 03/18/2024  Time Spent: 12:00pm-12:45pm   45 minutes   Treatment Type: Individual Therapy  Reported Symptoms: stress  Mental Status Exam: Appearance:  Casual     Behavior: Appropriate  Motor: Normal  Speech/Language:  Normal Rate  Affect: Appropriate  Mood: normal  Thought process: normal  Thought content:   WNL  Sensory/Perceptual disturbances:   WNL  Orientation: oriented to person, place, time/date, and situation  Attention: Good  Concentration: Good  Memory: WNL  Fund of knowledge:  Good  Insight:   Good  Judgment:  Good  Impulse Control: Good   Risk Assessment: Danger to Self:  No Self-injurious Behavior: No Danger to Others: No Duty to Warn:no Physical Aggression / Violence:No  Access to Firearms a concern: No  Gang Involvement:No   Subjective: Pt present for face-to-face individual therapy via video.  Pt consents to telehealth video session and is aware of limitations and benefits of virtual sessions.  Location of pt: home Location of therapist: home office.   Pt talked about Charlie being home from the hospital.  Charlie has decided to have one more chemo treatment and if the cancer does not improve he will discontinue treatment.  Pt will need to do a lot more care giving of [name] in the future.   She is worried about the care giving and what the next few months may be like.   Pt states she feels worried about Charlie passing away and where she will live since [name]'s son will take the house.  Pt states she is good in a crisis and knows she will be ok but she still worries.  Pt states she feels fearful a lot.  Worked on coping strategies.  Pt talked about her health.   She has been feeling fatigued.  Pt is having fibromyalgia flare ups.   Worked on self care strategies. Provided supportive therapy.    Interventions: Cognitive Behavioral  Therapy and Insight-Oriented  Diagnosis: F33.1   Plan of Care: Recommend ongoing therapy.   Pt participated in setting treatment goals.  Pt wants to have someone to talk to and to improve coping skills.  Pt wants to feel less anxious and depressed.  Plan to meet monthly.    Pt agrees with treatment plan.     Treatment Plan (Treatment Plan Target Date: 11/08/2024) Client Abilities/Strengths  Pt is bright, engaging, and motivated for therapy.   Client Treatment Preferences  Individual therapy.  Client Statement of Needs  Improve coping skills.  Symptoms  Depressed or irritable mood. Diminished interest in or enjoyment of activities. Lack of energy. Feelings of hopelessness, worthlessness, or inappropriate guilt. Unresolved grief issues.  Problems Addressed  Unipolar Depression Goals 1. Alleviate depressive symptoms and return to previous level of effective functioning. 2. Appropriately grieve the loss in order to normalize mood and to return to previously adaptive level of functioning. Objective Learn and implement behavioral strategies to overcome depression. Target Date: 2024-11-08 Frequency: Monthly  Progress: 40 Modality: individual  Related Interventions Engage the client in behavioral activation, increasing his/her activity level and contact with sources of reward, while identifying processes that inhibit activation.  Use behavioral techniques such as instruction, rehearsal, role-playing, role reversal, as needed, to facilitate activity in the client's daily life; reinforce success. Assist the client in developing skills that increase the likelihood of deriving pleasure from behavioral activation (e.g., assertiveness skills, developing an exercise plan, less internal/more  external focus, increased social involvement); reinforce success. Objective Identify important people in life, past and present, and describe the quality, good and poor, of those relationships. Target Date:  2024-11-08 Frequency:Monthly  Progress: 40 Modality: individual  Related Interventions Conduct Interpersonal Therapy beginning with the assessment of the client's interpersonal inventory of important past and present relationships; develop a case formulation linking depression to grief, interpersonal role disputes, role transitions, and/or interpersonal deficits). Objective Learn and implement problem-solving and decision-making skills. Target Date: 2024-11-08 Frequency: Monthly  Progress: 40 Modality: individual  Related Interventions Conduct Problem-Solving Therapy using techniques such as psychoeducation, modeling, and role-playing to teach client problem-solving skills (i.e., defining a problem specifically, generating possible solutions, evaluating the pros and cons of each solution, selecting and implementing a plan of action, evaluating the efficacy of the plan, accepting or revising the plan); role-play application of the problem-solving skill to a real life issue. Encourage in the client the development of a positive problem orientation in which problems and solving them are viewed as a natural part of life and not something to be feared, despaired, or avoided. 3. Develop healthy interpersonal relationships that lead to the alleviation and help prevent the relapse of depression. 4. Develop healthy thinking patterns and beliefs about self, others, and the world that lead to the alleviation and help prevent the relapse of depression. 5. Recognize, accept, and cope with feelings of depression. Diagnosis F33.1  Conditions For Discharge Achievement of treatment goals and objectives   [name], LCSW

## 2024-03-21 DIAGNOSIS — M71332 Other bursal cyst, left wrist: Secondary | ICD-10-CM | POA: Diagnosis not present

## 2024-03-25 DIAGNOSIS — M25532 Pain in left wrist: Secondary | ICD-10-CM | POA: Diagnosis not present

## 2024-04-04 ENCOUNTER — Ambulatory Visit (INDEPENDENT_AMBULATORY_CARE_PROVIDER_SITE_OTHER): Admitting: Psychology

## 2024-04-04 DIAGNOSIS — F331 Major depressive disorder, recurrent, moderate: Secondary | ICD-10-CM

## 2024-04-04 NOTE — Progress Notes (Signed)
 [facility] Counselor/Therapist Progress Note  Patient ID: Sandra Craig, MRN: [id number],    Date: 04/04/2024  Time Spent: 2:00pm-2:45pm   45 minutes   Treatment Type: Individual Therapy  Reported Symptoms: stress  Mental Status Exam: Appearance:  Casual     Behavior: Appropriate  Motor: Normal  Speech/Language:  Normal Rate  Affect: Appropriate  Mood: normal  Thought process: normal  Thought content:   WNL  Sensory/Perceptual disturbances:   WNL  Orientation: oriented to person, place, time/date, and situation  Attention: Good  Concentration: Good  Memory: WNL  Fund of knowledge:  Good  Insight:   Good  Judgment:  Good  Impulse Control: Good   Risk Assessment: Danger to Self:  No Self-injurious Behavior: No Danger to Others: No Duty to Warn:no Physical Aggression / Violence:No  Access to Firearms a concern: No  Gang Involvement:No   Subjective: Pt present for face-to-face individual therapy via video.  Pt consents to telehealth video session and is aware of limitations and benefits of virtual sessions.  Location of pt: home Location of therapist: home office.   Pt talked about Sandra Craig having a transfusion yesterday.   He is tolerating treatment ok so far and has even gone to work today.  Sandra Craig had a talk with pt and told her he does not want her to help him do things until he asks for help.  He wants to do all he can on his own.  This was reassuring to pt and a relief that she does not have to hover over him constantly.   Pt is making some plans with family and friends.   Pt and Sandra Craig are going to [place] for 5 days next week to be with friends.  They are both looking forward to that trip.  Pt talked about her health.   She has been feeling better physically which has also improved her mood.   Pt states that overall she is doing ok right now.    She does still get upset at times about money bc she feels like she has her pay to live there with  Sandra Craig.  At times she feels like he does not care about her as much as she cares about him.   Helped pt process her feelings and relationship dynamics.   Worked on self care strategies. Provided supportive therapy.    Interventions: Cognitive Behavioral Therapy and Insight-Oriented  Diagnosis: F33.1   Plan of Care: Recommend ongoing therapy.   Pt participated in setting treatment goals.  Pt wants to have someone to talk to and to improve coping skills.  Pt wants to feel less anxious and depressed.  Plan to meet monthly.    Pt agrees with treatment plan.     Treatment Plan (Treatment Plan Target Date: 11/08/2024) Client Abilities/Strengths  Pt is bright, engaging, and motivated for therapy.   Client Treatment Preferences  Individual therapy.  Client Statement of Needs  Improve coping skills.  Symptoms  Depressed or irritable mood. Diminished interest in or enjoyment of activities. Lack of energy. Feelings of hopelessness, worthlessness, or inappropriate guilt. Unresolved grief issues.  Problems Addressed  Unipolar Depression Goals 1. Alleviate depressive symptoms and return to previous level of effective functioning. 2. Appropriately grieve the loss in order to normalize mood and to return to previously adaptive level of functioning. Objective Learn and implement behavioral strategies to overcome depression. Target Date: 2024-11-08 Frequency: Monthly  Progress: 40 Modality: individual  Related Interventions Engage the client in behavioral activation,  increasing his/her activity level and contact with sources of reward, while identifying processes that inhibit activation.  Use behavioral techniques such as instruction, rehearsal, role-playing, role reversal, as needed, to facilitate activity in the client's daily life; reinforce success. Assist the client in developing skills that increase the likelihood of deriving pleasure from behavioral activation (e.g., assertiveness skills,  developing an exercise plan, less internal/more external focus, increased social involvement); reinforce success. Objective Identify important people in life, past and present, and describe the quality, good and poor, of those relationships. Target Date: 2024-11-08 Frequency:Monthly  Progress: 40 Modality: individual  Related Interventions Conduct Interpersonal Therapy beginning with the assessment of the client's interpersonal inventory of important past and present relationships; develop a case formulation linking depression to grief, interpersonal role disputes, role transitions, and/or interpersonal deficits). Objective Learn and implement problem-solving and decision-making skills. Target Date: 2024-11-08 Frequency: Monthly  Progress: 40 Modality: individual  Related Interventions Conduct Problem-Solving Therapy using techniques such as psychoeducation, modeling, and role-playing to teach client problem-solving skills (i.e., defining a problem specifically, generating possible solutions, evaluating the pros and cons of each solution, selecting and implementing a plan of action, evaluating the efficacy of the plan, accepting or revising the plan); role-play application of the problem-solving skill to a real life issue. Encourage in the client the development of a positive problem orientation in which problems and solving them are viewed as a natural part of life and not something to be feared, despaired, or avoided. 3. Develop healthy interpersonal relationships that lead to the alleviation and help prevent the relapse of depression. 4. Develop healthy thinking patterns and beliefs about self, others, and the world that lead to the alleviation and help prevent the relapse of depression. 5. Recognize, accept, and cope with feelings of depression. Diagnosis F33.1  Conditions For Discharge Achievement of treatment goals and objectives   [name], LCSW

## 2024-04-16 ENCOUNTER — Other Ambulatory Visit: Payer: Self-pay | Admitting: Family Medicine

## 2024-05-01 DIAGNOSIS — M25532 Pain in left wrist: Secondary | ICD-10-CM | POA: Diagnosis not present

## 2024-05-01 DIAGNOSIS — M67432 Ganglion, left wrist: Secondary | ICD-10-CM | POA: Diagnosis not present

## 2024-05-01 DIAGNOSIS — Z1330 Encounter for screening examination for mental health and behavioral disorders, unspecified: Secondary | ICD-10-CM | POA: Diagnosis not present

## 2024-05-02 ENCOUNTER — Ambulatory Visit: Admitting: Psychology

## 2024-05-13 DIAGNOSIS — J471 Bronchiectasis with (acute) exacerbation: Secondary | ICD-10-CM | POA: Diagnosis not present

## 2024-05-13 DIAGNOSIS — A310 Pulmonary mycobacterial infection: Secondary | ICD-10-CM | POA: Diagnosis not present

## 2024-05-14 ENCOUNTER — Ambulatory Visit (INDEPENDENT_AMBULATORY_CARE_PROVIDER_SITE_OTHER): Admitting: Psychology

## 2024-05-14 DIAGNOSIS — F331 Major depressive disorder, recurrent, moderate: Secondary | ICD-10-CM | POA: Diagnosis not present

## 2024-05-14 NOTE — Progress Notes (Signed)
 [facility] Counselor/Therapist Progress Note  Patient ID: Sandra Craig, MRN: [id number],    Date: 05/14/2024  Time Spent: 2:00pm-2:45pm   45 minutes   Treatment Type: Individual Therapy  Reported Symptoms: stress  Mental Status Exam: Appearance:  Casual     Behavior: Appropriate  Motor: Normal  Speech/Language:  Normal Rate  Affect: Appropriate  Mood: normal  Thought process: normal  Thought content:   WNL  Sensory/Perceptual disturbances:   WNL  Orientation: oriented to person, place, time/date, and situation  Attention: Good  Concentration: Good  Memory: WNL  Fund of knowledge:  Good  Insight:   Good  Judgment:  Good  Impulse Control: Good   Risk Assessment: Danger to Self:  No Self-injurious Behavior: No Danger to Others: No Duty to Warn:no Physical Aggression / Violence:No  Access to Firearms a concern: No  Gang Involvement:No   Subjective: Pt present for face-to-face individual therapy via video.  Pt consents to telehealth video session and is aware of limitations and benefits of virtual sessions.  Location of pt: home Location of therapist: home office.   Pt talked about [name].  They did not go on the trip to [facility] did not feel up to it.   They got the results of the PET scan and it was not good bc the cancer is growing and metastisizing.   [name] has decided to stop getting transfusions until he meets with his doctor about the plan of care given the new test results.  Pt states she is not looking forward to a long time of care giving.   She is already exhausted.  Pt can't leave [name] alone bc he is a fall risk.  Pt talked about her health.   She has a bad cough that she is worried about.  Pt often feels tired and fatigued.   Pt has reached out to her friends and family for support.   [name]'s sons are a good support as well.  Pt states she is stable emotionally bc she is relying on her faith, humor, and family. Worked  on self care strategies. Provided supportive therapy.    Interventions: Cognitive Behavioral Therapy and Insight-Oriented  Diagnosis: F33.1   Plan of Care: Recommend ongoing therapy.   Pt participated in setting treatment goals.  Pt wants to have someone to talk to and to improve coping skills.  Pt wants to feel less anxious and depressed.  Plan to meet monthly.    Pt agrees with treatment plan.     Treatment Plan (Treatment Plan Target Date: 11/08/2024) Client Abilities/Strengths  Pt is bright, engaging, and motivated for therapy.   Client Treatment Preferences  Individual therapy.  Client Statement of Needs  Improve coping skills.  Symptoms  Depressed or irritable mood. Diminished interest in or enjoyment of activities. Lack of energy. Feelings of hopelessness, worthlessness, or inappropriate guilt. Unresolved grief issues.  Problems Addressed  Unipolar Depression Goals 1. Alleviate depressive symptoms and return to previous level of effective functioning. 2. Appropriately grieve the loss in order to normalize mood and to return to previously adaptive level of functioning. Objective Learn and implement behavioral strategies to overcome depression. Target Date: 2024-11-08 Frequency: Monthly  Progress: 40 Modality: individual  Related Interventions Engage the client in behavioral activation, increasing his/her activity level and contact with sources of reward, while identifying processes that inhibit activation.  Use behavioral techniques such as instruction, rehearsal, role-playing, role reversal, as needed, to facilitate activity in the client's daily life; reinforce  success. Assist the client in developing skills that increase the likelihood of deriving pleasure from behavioral activation (e.g., assertiveness skills, developing an exercise plan, less internal/more external focus, increased social involvement); reinforce success. Objective Identify important people in life,  past and present, and describe the quality, good and poor, of those relationships. Target Date: 2024-11-08 Frequency:Monthly  Progress: 40 Modality: individual  Related Interventions Conduct Interpersonal Therapy beginning with the assessment of the client's interpersonal inventory of important past and present relationships; develop a case formulation linking depression to grief, interpersonal role disputes, role transitions, and/or interpersonal deficits). Objective Learn and implement problem-solving and decision-making skills. Target Date: 2024-11-08 Frequency: Monthly  Progress: 40 Modality: individual  Related Interventions Conduct Problem-Solving Therapy using techniques such as psychoeducation, modeling, and role-playing to teach client problem-solving skills (i.e., defining a problem specifically, generating possible solutions, evaluating the pros and cons of each solution, selecting and implementing a plan of action, evaluating the efficacy of the plan, accepting or revising the plan); role-play application of the problem-solving skill to a real life issue. Encourage in the client the development of a positive problem orientation in which problems and solving them are viewed as a natural part of life and not something to be feared, despaired, or avoided. 3. Develop healthy interpersonal relationships that lead to the alleviation and help prevent the relapse of depression. 4. Develop healthy thinking patterns and beliefs about self, others, and the world that lead to the alleviation and help prevent the relapse of depression. 5. Recognize, accept, and cope with feelings of depression. Diagnosis F33.1  Conditions For Discharge Achievement of treatment goals and objectives   [name], LCSW

## 2024-05-15 ENCOUNTER — Ambulatory Visit
Admission: RE | Admit: 2024-05-15 | Discharge: 2024-05-15 | Disposition: A | Source: Ambulatory Visit | Attending: Family Medicine | Admitting: Family Medicine

## 2024-05-15 ENCOUNTER — Encounter: Payer: Self-pay | Admitting: Family Medicine

## 2024-05-15 ENCOUNTER — Ambulatory Visit (INDEPENDENT_AMBULATORY_CARE_PROVIDER_SITE_OTHER): Admitting: Family Medicine

## 2024-05-15 VITALS — BP 122/72 | Temp 98.2°F | Ht 64.75 in | Wt 141.8 lb

## 2024-05-15 DIAGNOSIS — A310 Pulmonary mycobacterial infection: Secondary | ICD-10-CM | POA: Diagnosis not present

## 2024-05-15 DIAGNOSIS — I10 Essential (primary) hypertension: Secondary | ICD-10-CM

## 2024-05-15 DIAGNOSIS — J479 Bronchiectasis, uncomplicated: Secondary | ICD-10-CM | POA: Diagnosis not present

## 2024-05-15 DIAGNOSIS — Z23 Encounter for immunization: Secondary | ICD-10-CM

## 2024-05-15 DIAGNOSIS — R911 Solitary pulmonary nodule: Secondary | ICD-10-CM | POA: Diagnosis not present

## 2024-05-15 DIAGNOSIS — R053 Chronic cough: Secondary | ICD-10-CM

## 2024-05-15 DIAGNOSIS — E785 Hyperlipidemia, unspecified: Secondary | ICD-10-CM

## 2024-05-15 NOTE — Progress Notes (Signed)
 Phone [phone] In person visit   Subjective:   Sandra Craig is a 83 y.o. 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    Priority: High   Chronic low back pain 12/20/2013    Priority: High   Fibromyalgia 12/05/2007    Priority: High   UTI (urinary tract infection) 12/15/2018    Priority: Medium    Chronic kidney disease, stage 3b (HCC) 06/02/2017    Priority: Medium    Near syncope 03/11/2017    Priority: Medium    Hyperglycemia 06/27/2012    Priority: Medium    History of small bowel obstruction 06/28/2011    Priority: Medium    GERD with stricture 06/28/2011    Priority: Medium    Hypertension     Priority: Medium    Hyperlipidemia     Priority: Medium    INSOMNIA, CHRONIC 10/08/2008    Priority: Medium    Depression 02/08/2008    Priority: Medium    Facet arthropathy 09/14/2019    Priority: Low   Vaginal dryness 01/29/2019    Priority: Low   Chronic right shoulder pain 03/07/2014    Priority: Low   Benign essential tremor 10/09/2013    Priority: Low   Diverticulitis large intestine 02/11/2012    Priority: Low   Irritable bowel syndrome (IBS) 06/28/2011    Priority: Low   IC (interstitial cystitis)     Priority: Low   Hemorrhoids 10/08/2008    Priority: Low   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 30 tablet    atorvastatin  (LIPITOR) 40 MG tablet TAKE 1 TABLET(40 MG) BY MOUTH DAILY 90 tablet 3   Calcium  Citrate-Vitamin D  (CALCIUM  + D PO) Take 1 tablet by mouth 2 (two) times daily.     DULoxetine  (CYMBALTA ) 30 MG capsule TAKE 2 CAPSULE BY MOUTH EVERY MORNING AND 1 CAPSULE BY MOUTH EVERY EVENING 270 capsule 3   famotidine  (PEPCID ) 20 MG tablet TAKE 1 TABLET(20 MG) BY MOUTH TWICE DAILY 180 tablet 1   gabapentin  (NEURONTIN ) 300 MG capsule TAKE 1 CAPSULE(300 MG) BY MOUTH TWICE DAILY AS NEEDED FOR PAIN 180 capsule 3   losartan  (COZAAR ) 25 MG tablet Take 12.5 mg by mouth daily.     Multiple Vitamins-Minerals (MULTIVITAMIN WITH MINERALS) tablet Take 1 tablet by mouth daily.     Polyethyl Glycol-Propyl Glycol (SYSTANE) 0.4-0.3 % GEL ophthalmic gel Place 1 application into both eyes 3 (three) times daily as needed (dry eye).     sucralfate  (CARAFATE ) 1 g tablet Take 1 tablet (1 g total) by mouth 4 (four) times daily. Dissolve each tablet  in 15 cc water  before use. 120 tablet 2   zolpidem  (AMBIEN ) 5 MG tablet TAKE 1 TABLET BY MOUTH EVERY DAY AT BEDTIME FOR SLEEP 90 tablet 1   Current Facility-Administered Medications  Medication Dose Route Frequency Provider Last Rate Last Admin   Romosozumab -aqqg (EVENITY ) 105 MG/1. injection 210 mg  210 mg Subcutaneous Once          Objective:  BP 122/72 (BP Location: Left Arm, Patient Position: Sitting, Cuff Size: Normal)   Temp 98.2 F (36.8 C) (Temporal)   Ht 5' 4.75 (1.645 m)   Wt 141 lb 12.8 oz (64.3 kg)   LMP 08/25/1992   BMI 23.78 kg/m  Gen: NAD, resting comfortably Tympanogram is normal bilaterally, normal nasal turbinates, normal oropharynx CV: RRR no murmurs rubs or gallops Lungs: CTAB no crackles, wheeze, rhonchi Ext: no edema Skin:  warm, dry     Assessment and Plan   #social update- helping care for [name] with ongoing caner battle  # Chronic cough for 2 months worsening over the last 2 weeks now productive and 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 Ona [facility] with most recent visit 07/20/23 when he advised regular flutter valve use.  She also had a fungal smear from sputum just 2 days ago and no fungus was seen and no acid-fast bacilli on sputum culture.   She also has a known pulmonary nodule from January 01, 2024 with plan for 88-month follow-up  Started with earache and sore throat and then in last 2 weeks became more productive. No increased shortness of breath. Coughing up yellow sputum- thus reason for sputum at [facility]. They had wanted this a few months ago but she simply didn't have sputum at that time. She plans to schedule follow up.  A/P: Patient with history of non-small cell lung cancer with metastasis with upcoming scan planned in 6 months but with recent worsening cough in the last 2 weeks and clear lung exam and negative fungal evaluation just 2 days ago by pulmonary he opted to move the scan up to at present-ordered a stat to evaluate for recurrence of cancer and status of pulmonary nodule noted last visit    #Pulmonary MAI- follow up with Dr. Ona with [facility] with most recent visit 07/20/2023 -Advised regular flutter valve use previously but reports has not been using this-encouraged her to start this - Recent fungal culture and smear negative -After discussion she thinks she was supposed to schedule 64-month follow-up instead of just yearly-she plans to call to schedule and can get them to weigh in on the cough - Discussed option of antibiotic treatment with cough for 2 months and worsening in the last 2 weeks but we opted to wait on the CT scan ultimately  #hypertension with orthostatic hypotension S: medication: Losartan  12.5 mg restarted  by Dr. Gregory presyncope but was having elevated microalbumin to creatinine ratio compared to prior A/P: Blood pressure well-controlled and thankfully no presyncope recently-continue current medication  #hyperlipidemia/elevated LFT history #possible TIA history 06/2017- aspirin  81 mg S: Medication:atorvastatin  40 mg -in the past Rosuvastatin  40 mg ( change with creatinine clearance around 30-changed to atorvastatin )  Lab Results  Component Value Date   CHOL 183 01/05/2024   HDL 64.60 01/05/2024   LDLCALC 87 01/05/2024   LDLDIRECT 59.0 11/03/2020   TRIG 156.0 (H) 01/05/2024   CHOLHDL 3 01/05/2024     A/P: Cholesterol slightly above ideal goal of 70 or less for  LDL-we have encouraged consistency and we will recheck this with next labs-in the past she had this under 70.  Also want to be cautious with prediabetes risk.  Ideal LDL would be at least under 70 with TIA history  Recommended follow up: Return in about 8 months (around 01/13/2025) for physical or sooner if needed.Schedule b4 you leave. Future Appointments  Date Time Provider Department Center  06/04/2024  3:00 PM Bauert, Veva ORN, LCSW [facility] None  07/02/2024  9:30 AM [facility] None  07/02/2024  3:00 PM Bauert, Veva ORN, LCSW [facility] None  07/10/2024  3:15 PM Sherrod Sherrod, MD [facility] None  01/14/2025  1:40 PM [facility] ANNUAL WELLNESS VISIT 1 [facility]    Lab/Order associations:   ICD-10-CM   1. Pulmonary nodule  R91.1 CT Chest Wo Contrast    2. Chronic cough  R05.3 CT Chest Wo Contrast    3. MAI (mycobacterium avium-intracellulare) infection (HCC)  A31.0 CT Chest Wo Contrast    4. Primary hypertension  I10     5. Hyperlipidemia, unspecified hyperlipidemia type  E78.5     6. Immunization due  Z23 Flu vaccine HIGH DOSE PF(Fluzone Trivalent)      No orders of the defined types were placed in this encounter.   Return precautions advised.  Garnette Lukes, MD

## 2024-05-15 NOTE — Patient Instructions (Addendum)
 Let us  know if you don't hear within a week from [facility] for chest CT  Really glad you came in- we can update oncology after we get scan back but I did not cancel their scheduled 6 month yet  Recommended follow up: Return in about 8 months (around 01/13/2025) for physical or sooner if needed.Schedule b4 you leave. Happy to see you sooner if needed

## 2024-05-16 ENCOUNTER — Ambulatory Visit: Payer: Self-pay | Admitting: Family Medicine

## 2024-05-17 DIAGNOSIS — M25552 Pain in left hip: Secondary | ICD-10-CM | POA: Diagnosis not present

## 2024-05-20 DIAGNOSIS — L821 Other seborrheic keratosis: Secondary | ICD-10-CM | POA: Diagnosis not present

## 2024-05-20 DIAGNOSIS — L570 Actinic keratosis: Secondary | ICD-10-CM | POA: Diagnosis not present

## 2024-05-20 DIAGNOSIS — D692 Other nonthrombocytopenic purpura: Secondary | ICD-10-CM | POA: Diagnosis not present

## 2024-05-20 DIAGNOSIS — D1801 Hemangioma of skin and subcutaneous tissue: Secondary | ICD-10-CM | POA: Diagnosis not present

## 2024-05-20 DIAGNOSIS — L814 Other melanin hyperpigmentation: Secondary | ICD-10-CM | POA: Diagnosis not present

## 2024-05-20 DIAGNOSIS — Z1231 Encounter for screening mammogram for malignant neoplasm of breast: Secondary | ICD-10-CM | POA: Diagnosis not present

## 2024-05-20 LAB — HM MAMMOGRAPHY

## 2024-05-22 ENCOUNTER — Encounter: Payer: Self-pay | Admitting: Family Medicine

## 2024-05-22 DIAGNOSIS — N189 Chronic kidney disease, unspecified: Secondary | ICD-10-CM | POA: Diagnosis not present

## 2024-05-22 DIAGNOSIS — N2581 Secondary hyperparathyroidism of renal origin: Secondary | ICD-10-CM | POA: Diagnosis not present

## 2024-05-22 DIAGNOSIS — D631 Anemia in chronic kidney disease: Secondary | ICD-10-CM | POA: Diagnosis not present

## 2024-05-22 DIAGNOSIS — I129 Hypertensive chronic kidney disease with stage 1 through stage 4 chronic kidney disease, or unspecified chronic kidney disease: Secondary | ICD-10-CM | POA: Diagnosis not present

## 2024-05-22 DIAGNOSIS — N1832 Chronic kidney disease, stage 3b: Secondary | ICD-10-CM | POA: Diagnosis not present

## 2024-05-23 LAB — LAB REPORT - SCANNED
Albumin, Urine POC: 11.5
Creatinine, POC: 79.2 mg/dL
EGFR: 23
Microalb Creat Ratio: 15

## 2024-05-24 ENCOUNTER — Inpatient Hospital Stay (HOSPITAL_COMMUNITY)
Admission: EM | Admit: 2024-05-24 | Discharge: 2024-05-29 | DRG: 536 | Disposition: A | Discharge status: Skilled Nursing Facility | Attending: Internal Medicine | Admitting: Internal Medicine

## 2024-05-24 ENCOUNTER — Emergency Department (HOSPITAL_COMMUNITY)

## 2024-05-24 ENCOUNTER — Encounter (HOSPITAL_COMMUNITY): Payer: Self-pay | Admitting: *Deleted

## 2024-05-24 ENCOUNTER — Other Ambulatory Visit: Payer: Self-pay

## 2024-05-24 DIAGNOSIS — Z886 Allergy status to analgesic agent status: Secondary | ICD-10-CM

## 2024-05-24 DIAGNOSIS — M545 Low back pain, unspecified: Secondary | ICD-10-CM | POA: Diagnosis present

## 2024-05-24 DIAGNOSIS — Z7982 Long term (current) use of aspirin: Secondary | ICD-10-CM

## 2024-05-24 DIAGNOSIS — N1832 Chronic kidney disease, stage 3b: Secondary | ICD-10-CM | POA: Diagnosis present

## 2024-05-24 DIAGNOSIS — F419 Anxiety disorder, unspecified: Secondary | ICD-10-CM | POA: Diagnosis present

## 2024-05-24 DIAGNOSIS — R2681 Unsteadiness on feet: Secondary | ICD-10-CM | POA: Diagnosis not present

## 2024-05-24 DIAGNOSIS — S32592A Other specified fracture of left pubis, initial encounter for closed fracture: Secondary | ICD-10-CM | POA: Diagnosis present

## 2024-05-24 DIAGNOSIS — E875 Hyperkalemia: Secondary | ICD-10-CM | POA: Diagnosis present

## 2024-05-24 DIAGNOSIS — Z87891 Personal history of nicotine dependence: Secondary | ICD-10-CM | POA: Diagnosis not present

## 2024-05-24 DIAGNOSIS — Z8673 Personal history of transient ischemic attack (TIA), and cerebral infarction without residual deficits: Secondary | ICD-10-CM | POA: Diagnosis not present

## 2024-05-24 DIAGNOSIS — Z8249 Family history of ischemic heart disease and other diseases of the circulatory system: Secondary | ICD-10-CM | POA: Diagnosis not present

## 2024-05-24 DIAGNOSIS — S32512A Fracture of superior rim of left pubis, initial encounter for closed fracture: Secondary | ICD-10-CM | POA: Diagnosis not present

## 2024-05-24 DIAGNOSIS — R5383 Other fatigue: Secondary | ICD-10-CM | POA: Diagnosis not present

## 2024-05-24 DIAGNOSIS — M797 Fibromyalgia: Secondary | ICD-10-CM | POA: Diagnosis present

## 2024-05-24 DIAGNOSIS — N184 Chronic kidney disease, stage 4 (severe): Secondary | ICD-10-CM | POA: Diagnosis present

## 2024-05-24 DIAGNOSIS — S329XXA Fracture of unspecified parts of lumbosacral spine and pelvis, initial encounter for closed fracture: Principal | ICD-10-CM | POA: Diagnosis present

## 2024-05-24 DIAGNOSIS — Y93K1 Activity, walking an animal: Secondary | ICD-10-CM | POA: Diagnosis not present

## 2024-05-24 DIAGNOSIS — E876 Hypokalemia: Secondary | ICD-10-CM | POA: Diagnosis present

## 2024-05-24 DIAGNOSIS — Z800 Family history of malignant neoplasm of digestive organs: Secondary | ICD-10-CM

## 2024-05-24 DIAGNOSIS — Z96641 Presence of right artificial hip joint: Secondary | ICD-10-CM | POA: Diagnosis present

## 2024-05-24 DIAGNOSIS — S0990XA Unspecified injury of head, initial encounter: Secondary | ICD-10-CM | POA: Diagnosis not present

## 2024-05-24 DIAGNOSIS — Z801 Family history of malignant neoplasm of trachea, bronchus and lung: Secondary | ICD-10-CM

## 2024-05-24 DIAGNOSIS — Z807 Family history of other malignant neoplasms of lymphoid, hematopoietic and related tissues: Secondary | ICD-10-CM

## 2024-05-24 DIAGNOSIS — Z85118 Personal history of other malignant neoplasm of bronchus and lung: Secondary | ICD-10-CM | POA: Diagnosis not present

## 2024-05-24 DIAGNOSIS — W1830XA Fall on same level, unspecified, initial encounter: Secondary | ICD-10-CM | POA: Diagnosis present

## 2024-05-24 DIAGNOSIS — E785 Hyperlipidemia, unspecified: Secondary | ICD-10-CM | POA: Diagnosis present

## 2024-05-24 DIAGNOSIS — Z825 Family history of asthma and other chronic lower respiratory diseases: Secondary | ICD-10-CM

## 2024-05-24 DIAGNOSIS — M6281 Muscle weakness (generalized): Secondary | ICD-10-CM | POA: Diagnosis not present

## 2024-05-24 DIAGNOSIS — M47812 Spondylosis without myelopathy or radiculopathy, cervical region: Secondary | ICD-10-CM | POA: Diagnosis not present

## 2024-05-24 DIAGNOSIS — Z881 Allergy status to other antibiotic agents status: Secondary | ICD-10-CM | POA: Diagnosis not present

## 2024-05-24 DIAGNOSIS — G8929 Other chronic pain: Secondary | ICD-10-CM | POA: Diagnosis present

## 2024-05-24 DIAGNOSIS — S32502A Unspecified fracture of left pubis, initial encounter for closed fracture: Secondary | ICD-10-CM | POA: Diagnosis not present

## 2024-05-24 DIAGNOSIS — S79919A Unspecified injury of unspecified hip, initial encounter: Secondary | ICD-10-CM | POA: Diagnosis not present

## 2024-05-24 DIAGNOSIS — S32512D Fracture of superior rim of left pubis, subsequent encounter for fracture with routine healing: Secondary | ICD-10-CM | POA: Diagnosis not present

## 2024-05-24 DIAGNOSIS — K589 Irritable bowel syndrome without diarrhea: Secondary | ICD-10-CM | POA: Diagnosis present

## 2024-05-24 DIAGNOSIS — M25552 Pain in left hip: Secondary | ICD-10-CM | POA: Diagnosis not present

## 2024-05-24 DIAGNOSIS — K219 Gastro-esophageal reflux disease without esophagitis: Secondary | ICD-10-CM | POA: Diagnosis present

## 2024-05-24 DIAGNOSIS — I129 Hypertensive chronic kidney disease with stage 1 through stage 4 chronic kidney disease, or unspecified chronic kidney disease: Secondary | ICD-10-CM | POA: Diagnosis not present

## 2024-05-24 DIAGNOSIS — G4700 Insomnia, unspecified: Secondary | ICD-10-CM | POA: Diagnosis present

## 2024-05-24 DIAGNOSIS — D62 Acute posthemorrhagic anemia: Secondary | ICD-10-CM | POA: Diagnosis present

## 2024-05-24 DIAGNOSIS — M4312 Spondylolisthesis, cervical region: Secondary | ICD-10-CM | POA: Diagnosis not present

## 2024-05-24 DIAGNOSIS — M810 Age-related osteoporosis without current pathological fracture: Secondary | ICD-10-CM | POA: Diagnosis present

## 2024-05-24 DIAGNOSIS — W19XXXA Unspecified fall, initial encounter: Principal | ICD-10-CM

## 2024-05-24 DIAGNOSIS — Z9181 History of falling: Secondary | ICD-10-CM | POA: Diagnosis not present

## 2024-05-24 DIAGNOSIS — F32A Depression, unspecified: Secondary | ICD-10-CM | POA: Diagnosis present

## 2024-05-24 DIAGNOSIS — Z8349 Family history of other endocrine, nutritional and metabolic diseases: Secondary | ICD-10-CM

## 2024-05-24 DIAGNOSIS — I10 Essential (primary) hypertension: Secondary | ICD-10-CM | POA: Diagnosis present

## 2024-05-24 DIAGNOSIS — Z7401 Bed confinement status: Secondary | ICD-10-CM | POA: Diagnosis not present

## 2024-05-24 DIAGNOSIS — Z96611 Presence of right artificial shoulder joint: Secondary | ICD-10-CM | POA: Diagnosis present

## 2024-05-24 DIAGNOSIS — Z79899 Other long term (current) drug therapy: Secondary | ICD-10-CM | POA: Diagnosis not present

## 2024-05-24 DIAGNOSIS — S199XXA Unspecified injury of neck, initial encounter: Secondary | ICD-10-CM | POA: Diagnosis not present

## 2024-05-24 DIAGNOSIS — R9082 White matter disease, unspecified: Secondary | ICD-10-CM | POA: Diagnosis not present

## 2024-05-24 LAB — CBC WITH DIFFERENTIAL/PLATELET
Abs Immature Granulocytes: 0.05 K/uL (ref 0.00–0.07)
Basophils Absolute: 0 K/uL (ref 0.0–0.1)
Basophils Relative: 0 %
Eosinophils Absolute: 0.1 K/uL (ref 0.0–0.5)
Eosinophils Relative: 1 %
HCT: 41.2 % (ref 36.0–46.0)
Hemoglobin: 12.3 g/dL (ref 12.0–15.0)
Immature Granulocytes: 1 %
Lymphocytes Relative: 15 %
Lymphs Abs: 1.2 K/uL (ref 0.7–4.0)
MCH: 28.4 pg (ref 26.0–34.0)
MCHC: 29.9 g/dL — ABNORMAL LOW (ref 30.0–36.0)
MCV: 95.2 fL (ref 80.0–100.0)
Monocytes Absolute: 0.7 K/uL (ref 0.1–1.0)
Monocytes Relative: 9 %
Neutro Abs: 5.8 K/uL (ref 1.7–7.7)
Neutrophils Relative %: 74 %
Platelets: 196 K/uL (ref 150–400)
RBC: 4.33 MIL/uL (ref 3.87–5.11)
RDW: 13.8 % (ref 11.5–15.5)
WBC: 7.8 K/uL (ref 4.0–10.5)
nRBC: 0 % (ref 0.0–0.2)

## 2024-05-24 LAB — COMPREHENSIVE METABOLIC PANEL WITH GFR
ALT: 48 U/L — ABNORMAL HIGH (ref 0–44)
AST: 37 U/L (ref 15–41)
Albumin: 3.8 g/dL (ref 3.5–5.0)
Alkaline Phosphatase: 84 U/L (ref 38–126)
Anion gap: 9 (ref 5–15)
BUN: 33 mg/dL — ABNORMAL HIGH (ref 8–23)
CO2: 28 mmol/L (ref 22–32)
Calcium: 9.6 mg/dL (ref 8.9–10.3)
Chloride: 105 mmol/L (ref 98–111)
Creatinine, Ser: 1.83 mg/dL — ABNORMAL HIGH (ref 0.44–1.00)
GFR, Estimated: 27 mL/min — ABNORMAL LOW (ref >60)
Glucose, Bld: 85 mg/dL (ref 70–99)
Potassium: 4.9 mmol/L (ref 3.5–5.1)
Sodium: 141 mmol/L (ref 135–145)
Total Bilirubin: 0.4 mg/dL (ref 0.0–1.2)
Total Protein: 6.6 g/dL (ref 6.5–8.1)

## 2024-05-24 LAB — CK: Total CK: 37 U/L — ABNORMAL LOW (ref 38–234)

## 2024-05-24 LAB — MAGNESIUM: Magnesium: 2.4 mg/dL (ref 1.7–2.4)

## 2024-05-24 LAB — VITAMIN D 25 HYDROXY (VIT D DEFICIENCY, FRACTURES): Vit D, 25-Hydroxy: 52.26 ng/mL (ref 30–100)

## 2024-05-24 MED ORDER — DULOXETINE HCL 30 MG PO CPEP
30.0000 mg | ORAL_CAPSULE | Freq: Every day | ORAL | Status: DC
  Administered 2024-05-24 – 2024-05-28 (×5): 30 mg via ORAL
  Filled 2024-05-24 (×5): qty 1

## 2024-05-24 MED ORDER — DULOXETINE HCL 30 MG PO CPEP
60.0000 mg | ORAL_CAPSULE | Freq: Every morning | ORAL | Status: DC
  Administered 2024-05-25 – 2024-05-29 (×5): 60 mg via ORAL
  Filled 2024-05-24 (×5): qty 2

## 2024-05-24 MED ORDER — ACETAMINOPHEN 325 MG PO TABS
650.0000 mg | ORAL_TABLET | Freq: Four times a day (QID) | ORAL | Status: DC | PRN
Start: 2024-05-24 — End: 2024-05-29
  Administered 2024-05-25 – 2024-05-27 (×3): 650 mg via ORAL
  Filled 2024-05-24 (×3): qty 2

## 2024-05-24 MED ORDER — HYDROMORPHONE HCL 1 MG/ML IJ SOLN
0.5000 mg | INTRAMUSCULAR | Status: DC | PRN
  Administered 2024-05-24 – 2024-05-29 (×8): 0.5 mg via INTRAVENOUS
  Filled 2024-05-24 (×8): qty 0.5

## 2024-05-24 MED ORDER — GABAPENTIN 100 MG PO CAPS
200.0000 mg | ORAL_CAPSULE | Freq: Two times a day (BID) | ORAL | Status: DC
  Administered 2024-05-24 – 2024-05-29 (×10): 200 mg via ORAL
  Filled 2024-05-24 (×10): qty 2

## 2024-05-24 MED ORDER — FAMOTIDINE 20 MG PO TABS
20.0000 mg | ORAL_TABLET | Freq: Every day | ORAL | Status: DC
  Administered 2024-05-25 – 2024-05-29 (×5): 20 mg via ORAL
  Filled 2024-05-24 (×5): qty 1

## 2024-05-24 MED ORDER — OXYCODONE HCL 5 MG PO TABS
5.0000 mg | ORAL_TABLET | Freq: Four times a day (QID) | ORAL | Status: DC | PRN
  Administered 2024-05-25 – 2024-05-29 (×12): 5 mg via ORAL
  Filled 2024-05-24 (×12): qty 1

## 2024-05-24 MED ORDER — HEPARIN SODIUM (PORCINE) 5000 UNIT/ML IJ SOLN
5000.0000 [IU] | Freq: Three times a day (TID) | INTRAMUSCULAR | Status: DC
  Administered 2024-05-24 – 2024-05-26 (×5): 5000 [IU] via SUBCUTANEOUS
  Filled 2024-05-24 (×5): qty 1

## 2024-05-24 MED ORDER — ACETAMINOPHEN 650 MG RE SUPP: 650.0000 mg | Freq: Four times a day (QID) | RECTAL | Status: DC | PRN

## 2024-05-24 MED ORDER — ACETAMINOPHEN 500 MG PO TABS
1000.0000 mg | ORAL_TABLET | Freq: Once | ORAL | Status: AC
  Administered 2024-05-24: 1000 mg via ORAL
  Filled 2024-05-24: qty 2

## 2024-05-24 MED ORDER — OXYCODONE HCL 5 MG PO TABS
5.0000 mg | ORAL_TABLET | Freq: Once | ORAL | Status: AC
  Administered 2024-05-24: 5 mg via ORAL
  Filled 2024-05-24: qty 1

## 2024-05-24 MED ORDER — ZOLPIDEM TARTRATE 5 MG PO TABS
5.0000 mg | ORAL_TABLET | Freq: Every day | ORAL | Status: DC
  Administered 2024-05-24 – 2024-05-28 (×5): 5 mg via ORAL
  Filled 2024-05-24 (×5): qty 1

## 2024-05-24 NOTE — ED Notes (Signed)
Patient resting in bed with visitor at bedside.

## 2024-05-24 NOTE — ED Notes (Signed)
 ED TO INPATIENT HANDOFF REPORT  Name/Age/Gender Sandra Craig 83 y.o. female  Code Status    Code Status Orders  (From admission, onward)           Start     Ordered   05/24/24 2024  Full code  Continuous       Question:  By:  Answer:  Consent: discussion documented in EHR   05/24/24 2023           Code Status History     Date Active Date Inactive Code Status Order ID Comments User Context   08/05/2022 1151 08/07/2022 1825 Full Code [id number]  Ricke Con JAYSON DEVONNA Inpatient   07/04/2022 1810 07/05/2022 2013 Full Code [id number]  Jonel Lonni SQUIBB, MD Inpatient   06/02/2017 1821 06/03/2017 2026 Full Code [id number]  Jadine Toribio SQUIBB, MD Inpatient   02/11/2012 2223 02/17/2012 1421 Full Code [id number]  Marjory Woodroe LABOR, RN Inpatient      Advance Directive Documentation    Flowsheet Row Most Recent Value  Type of Advance Directive Healthcare Power of Attorney, Living will  Pre-existing out of facility DNR order (yellow form or pink MOST form) --  MOST Form in Place? --    Home/SNF/Other Home  Chief Complaint Closed pelvic fracture (HCC) [S32.9XXA]  Level of Care/Admitting Diagnosis ED Disposition     ED Disposition  Admit   Condition  --   Comment  Hospital Area: [facility] [[id number]]  Level of Care: Med-Surg [16]  May place patient in observation at [facility] or [facility] if equivalent level of care is available:: No  Diagnosis: Closed pelvic fracture [facility]) [[id number]]  Admitting Physician: KATHRIN MIGNON DASEN [[id number]]  Attending Physician: KATHRIN MIGNON DASEN [id number]  For patients discharging to extended facilities (i.e. 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   Chronic insomnia    Chronic kidney disease    stage 4    Cystitis    Depression    Diverticulitis of intestine without perforation or abscess without bleeding    Patient did have abscess but noperforation   Diverticulosis of colon (without mention of hemorrhage)    Endometriosis    Family history of malignant neoplasm of gastrointestinal tract    Fibromyalgia    Fractures involving multiple body regions    Gastritis    GERD (gastroesophageal reflux disease)    Hiatal hernia    HIATAL HERNIA 10/08/2008   Qualifier: Diagnosis of  By: Genie CMA LEODIS), Chick     History of gallstones    HSV-1 infection    Hyperlipidemia    Hypertension    IBS (irritable bowel syndrome)    IC (interstitial cystitis)    Internal hemorrhoid    Mycobacterium avium complex (HCC)    history- took antibiotics and completed course   Non-small cell lung cancer (HCC)    in remission   Osteonecrosis (HCC)    Osteopenia    Osteoporosis    Palpitations    Polycystic kidney disease    pt denies   Pulmonary nodule 07/2007   5 mm Anterior RUL   Small bowel obstruction (HCC)    Stroke (HCC) 2021   TIA    Allergies Allergies  Allergen Reactions   Azithromycin  Rash  Pruritic rash diffuse     Celecoxib Nausea Only    IV Location/Drains/Wounds Patient Lines/Drains/Airways Status     Active Line/Drains/Airways     Name Placement date Placement time Site Days   Peripheral IV 05/24/24 22 G 1 Anterior;Right Forearm 05/24/24  1845  Forearm  less than 1            Labs/Imaging Results for orders placed or performed during the hospital encounter of 05/24/24 (from the past 48 hours)  CBC with Differential     Status: Abnormal   Collection Time: 05/24/24  6:37 PM  Result Value Ref Range   WBC 7.8 4.0 - 10.5 K/uL   RBC 4.33 3.87 - 5.11 MIL/uL   Hemoglobin 12.3 12.0 - 15.0 g/dL   HCT 58.7 63.9 - 53.9 %   MCV 95.2 80.0 - 100.0 fL   MCH 28.4 26.0 - 34.0 pg   MCHC 29.9 (L) 30.0 - 36.0 g/dL   RDW 86.1 88.4 - 84.4 %   Platelets 196 150 - 400 K/uL    nRBC 0.0 0.0 - 0.2 %   Neutrophils Relative % 74 %   Neutro Abs 5.8 1.7 - 7.7 K/uL   Lymphocytes Relative 15 %   Lymphs Abs 1.2 0.7 - 4.0 K/uL   Monocytes Relative 9 %   Monocytes Absolute 0.7 0.1 - 1.0 K/uL   Eosinophils Relative 1 %   Eosinophils Absolute 0.1 0.0 - 0.5 K/uL   Basophils Relative 0 %   Basophils Absolute 0.0 0.0 - 0.1 K/uL   Immature Granulocytes 1 %   Abs Immature Granulocytes 0.05 0.00 - 0.07 K/uL    Comment: Performed at [facility], [address] [place], [place] [address]  Comprehensive metabolic panel     Status: Abnormal   Collection Time: 05/24/24  6:37 PM  Result Value Ref Range   Sodium 141 135 - 145 mmol/L   Potassium 4.9 3.5 - 5.1 mmol/L   Chloride 105 98 - 111 mmol/L   CO2 28 22 - 32 mmol/L   Glucose, Bld 85 70 - 99 mg/dL    Comment: Glucose reference range applies only to samples taken after fasting for at least 8 hours.   BUN 33 (H) 8 - 23 mg/dL   Creatinine, Ser 8.16 (H) 0.44 - 1.00 mg/dL   Calcium  9.6 8.9 - 10.3 mg/dL   Total Protein 6.6 6.5 - 8.1 g/dL   Albumin  3.8 3.5 - 5.0 g/dL   AST 37 15 - 41 U/L    Comment: HEMOLYSIS AT THIS LEVEL MAY AFFECT RESULT   ALT 48 (H) 0 - 44 U/L   Alkaline Phosphatase 84 38 - 126 U/L   Total Bilirubin 0.4 0.0 - 1.2 mg/dL   GFR, Estimated 27 (L) >60 mL/min    Comment: (NOTE) Calculated using the CKD-EPI Creatinine Equation (2021)    Anion gap 9 5 - 15    Comment: Performed at [facility], [address] [place], [place] [address]  Magnesium      Status: None   Collection Time: 05/24/24  6:37 PM  Result Value Ref Range   Magnesium  2.4 1.7 - 2.4 mg/dL    Comment: Performed at [facility], [address], [place], [place] [address]  CK     Status: Abnormal   Collection Time: 05/24/24  6:37 PM  Result Value Ref Range   Total CK 37 (L) 38 - 234 U/L    Comment: Performed at [facility], [address] [place], [place] [address]    CT Hip Left Wo Contrast Result Date: 05/24/2024 CLINICAL DATA:  Hip trauma, fracture suspected, xray done Left hip pain after falling. EXAM: CT OF THE LEFT HIP WITHOUT CONTRAST TECHNIQUE: Multidetector CT imaging of the left hip was performed according to the standard protocol. Multiplanar CT image reconstructions were also generated. RADIATION DOSE REDUCTION: This exam was performed according to the departmental dose-optimization program which includes automated exposure control, adjustment of the mA and/or kV according to patient size and/or use of iterative reconstruction technique. COMPARISON:  Radiographs 05/24/2024.  Left hip MRI 12/19/2023 FINDINGS: Bones/Joint/Cartilage There are nondisplaced fractures of the left superior and inferior pubic rami. The fracture of the left superior pubic ramus extends into the medial wall of the left acetabulum. The left femoral head is located and intact. No evidence of proximal femur fracture or osteonecrosis. There are mild left hip degenerative changes without significant joint effusion. The left sacroiliac joint and symphysis pubis appear intact. Ligaments Suboptimally assessed by CT. Muscles and Tendons Unremarkable. Soft tissues No evidence of significant pelvic hematoma. There are postsurgical changes consistent with previous left inguinal herniorrhaphy. Diverticular changes in the sigmoid colon without evidence of acute inflammation. Mild iliofemoral atherosclerosis. IMPRESSION: 1. Nondisplaced fractures of the left superior and inferior pubic rami. The fracture of the left superior pubic ramus extends into the medial wall of the left acetabulum. 2. No evidence of proximal femur fracture, dislocation or osteonecrosis. 3. Mild left hip degenerative changes. Electronically Signed   By: Elsie Perone M.D.   On: 05/24/2024 14:47   CT Cervical Spine Wo Contrast Result Date: 05/24/2024 EXAM: CT CERVICAL SPINE WITHOUT CONTRAST 05/24/2024 02:33:36 PM TECHNIQUE:  CT of the cervical spine was performed without the administration of intravenous contrast. Multiplanar reformatted images are provided for review. Automated exposure control, iterative reconstruction, and/or weight based adjustment of the mA/kV was utilized to reduce the radiation dose to as low as reasonably achievable. COMPARISON: None available. CLINICAL HISTORY: Neck trauma (Age >= 65y). Table formatting from the original note was not included.; BIB EMS due to fall and left hip pain, sharp pain when trying to stand and radiates No noted shortening or rotation, she got tangled in dog leash fell back and hit head on cabinet. 150/100-90-95% RA FINDINGS: CERVICAL SPINE: BONES AND ALIGNMENT: Straightening of the normal cervical lordosis is present. Slight degenerative anterolisthesis is present at C3-C4 and C4-C5. No acute fracture or traumatic malalignment. DEGENERATIVE CHANGES: Slight degenerative anterolisthesis is present at C3-C4 and C4-C5. Chronic endplate changes are present at C3-C4, C5-C6 and C6-C7. SOFT TISSUES: No prevertebral soft tissue swelling. 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 IMPRESSION: 1. No acute abnormality of the cervical spine related to neck trauma. 2. Straightening of the normal cervical lordosis. 3. Slight degenerative anterolisthesis at C3-4 and C4-5. 4. Chronic endplate changes at C3-4, C5-6, and C6-7. Electronically signed by: Lonni Necessary MD 05/24/2024 02:44 PM EDT RP Workstation: [id number]   CT Head Wo Contrast Result Date: 05/24/2024 EXAM: CT HEAD WITHOUT CONTRAST 05/24/2024 02:33:36 PM TECHNIQUE: CT of the head was performed without the administration of intravenous contrast. Automated exposure control, iterative reconstruction, and/or weight based adjustment of the mA/kV was utilized to reduce the radiation dose to as low as reasonably achievable. COMPARISON: None available. CLINICAL  HISTORY: BIB EMS due to fall and left hip pain, sharp pain when trying to stand and radiates No noted  shortening or rotation, she got tangled in dog leash fell back and hit head on cabinet. 150/100-90-95% RA FINDINGS: BRAIN AND VENTRICLES: No acute hemorrhage. No evidence of acute infarct. No hydrocephalus. No extra-axial collection. No mass effect or midline shift. Age-related cerebral volume loss and mild-to-moderate periventricular and deep cerebral white matter disease. 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 Mild calcific atheromatous disease. ORBITS: No acute abnormality. Status post bilateral lens replacement. SINUSES: No acute abnormality. SOFT TISSUES AND SKULL: No acute soft tissue abnormality. No skull fracture. IMPRESSION: 1. No acute intracranial abnormality related to the reported fall and head trauma. 2. Age-related cerebral volume loss and mild-to-moderate periventricular and deep cerebral white matter disease. 3. 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 Electronically signed by: Lonni Necessary MD 05/24/2024 02:39 PM EDT RP Workstation: [id number]   DG Hip Unilat With Pelvis 2-3 Views Left Result Date: 05/24/2024 CLINICAL DATA:  Fall, left hip and groin pain EXAM: DG HIP (WITH OR WITHOUT PELVIS) 2-3V LEFT COMPARISON:  None Available. FINDINGS: Prior right hip replacement. Fractures in the left superior and inferior pubic rami. The superior pubic ramus fracture courses toward the left acetabulum and possibly involves the medial wall. No subluxation or dislocation. No femoral neck fracture. IMPRESSION: Left superior and inferior pubic rami fractures. Possible involvement of the medial acetabulum. Electronically Signed   By: Franky Crease M.D.   On: 05/24/2024 12:38    Pending Labs Unresulted Labs (From admission, onward)     Start     Ordered   05/25/24 0500  Comprehensive metabolic panel  Tomorrow morning,   R        05/24/24 2023   05/25/24 0500  CBC   Tomorrow morning,   R        05/24/24 2023   05/24/24 1839  VITAMIN D  25 Hydroxy (Vit-D Deficiency, Fractures)  Once,   R        05/24/24 1838            Vitals/Pain Today's Vitals   05/24/24 1148 05/24/24 1309 05/24/24 1541 05/24/24 1946  BP:  (!) 186/93 (!) 148/85   Pulse:  79 87   Resp:  18 18   Temp:  (!) 97.4 F (36.3 C) 97.7 F (36.5 C) 97.6 F (36.4 C)  TempSrc:  Oral  Oral  SpO2:  100% 99%   Weight:      Height:      PainSc: 8        Isolation Precautions No active isolations  Medications Medications  zolpidem  (AMBIEN ) tablet 5 mg (has no administration in time range)  DULoxetine  (CYMBALTA ) DR capsule 60 mg (has no administration in time range)    And  DULoxetine  (CYMBALTA ) DR capsule 30 mg (has no administration in time range)  famotidine  (PEPCID ) tablet 20 mg (has no administration in time range)  gabapentin  (NEURONTIN ) capsule 200 mg (has no administration in time range)  heparin  injection 5,000 Units (has no administration in time range)  acetaminophen  (TYLENOL ) tablet 650 mg (has no administration in time range)    Or  acetaminophen  (TYLENOL ) suppository 650 mg (has no administration in time range)  oxyCODONE  (Oxy IR/ROXICODONE ) immediate release tablet 5 mg (has no administration in time range)  HYDROmorphone  (DILAUDID ) injection 0.5 mg (has no administration in time range)  oxyCODONE  (Oxy IR/ROXICODONE ) immediate release tablet 5 mg (5 mg Oral Given 05/24/24 1303)  acetaminophen  (TYLENOL ) tablet 1,000 mg (1,000 mg Oral Given 05/24/24 1552)  oxyCODONE  (Oxy IR/ROXICODONE ) immediate release tablet 5  mg (5 mg Oral Given 05/24/24 1837)    Mobility walks with person assist

## 2024-05-24 NOTE — ED Provider Notes (Signed)
 [place] [facility] AT [facility] Provider Note   CSN: [id number] Arrival date & time: 05/24/24  1140     Patient presents with: Fall and Hip Pain   Sandra Craig is a 83 y.o. female who presents via EMS following mechanical fall.  Patient was at the vet and states that she got tangled in her dog leash and fell back and hit her head on the cabinet.  She is not on blood thinners.  She did not pass out.  She denies significant headache, vision changes, extremity weakness or numbness.  She primarily complains of pain to her neck and left hip.  Reports that she was unable to ambulate following the injury.     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   Chronic insomnia    Chronic kidney disease    stage 4   Cystitis    Depression    Diverticulitis of intestine without perforation or abscess without bleeding    Patient did have abscess but noperforation   Diverticulosis of colon (without mention of hemorrhage)    Endometriosis    Family history of malignant neoplasm of gastrointestinal tract    Fibromyalgia    Fractures involving multiple body regions    Gastritis    GERD (gastroesophageal reflux disease)    Hiatal hernia    HIATAL HERNIA 10/08/2008   Qualifier: Diagnosis of  By: Genie CMA (AAMA), Chick     History of gallstones    HSV-1 infection    Hyperlipidemia    Hypertension    IBS (irritable bowel syndrome)    IC (interstitial cystitis)    Internal hemorrhoid    Mycobacterium avium complex (HCC)    history- took antibiotics and completed course   Non-small cell lung cancer (HCC)    in remission   Osteonecrosis (HCC)    Osteopenia    Osteoporosis    Palpitations    Polycystic kidney disease    pt denies   Pulmonary nodule 07/2007   5 mm Anterior RUL   Small bowel obstruction (HCC)    Stroke (HCC) 2021   TIA   Past Surgical History:   Procedure Laterality Date   ABDOMINAL HYSTERECTOMY  08/01/1992   TAH,BSO FOR ENDOMETRIOSIS   ABDOMINAL SURGERY  08/01/2009   small intestine blockage   CHOLECYSTECTOMY     GASTROPLASTY  08/01/2009   small bowel resection -open   INGUINAL HERNIA REPAIR Right 02/28/2020   Procedure: LAPAROSCOPIC RIGHT INGUINAL HERNIA REPAIR WITH MESH;  Surgeon: Rubin Calamity, MD;  Location: [facility] OR;  Service: General;  Laterality: Right;   JOINT REPLACEMENT  08/01/2006   OOPHORECTOMY  08/01/1992   TAH,BSO   PELVIC LAPAROSCOPY     S/P right shoulder rotater cuff  200216/2011   Tear/adhesive capsulitis   SBO Lap  08/01/2009   Adhesions and small internal hernia   SHOULDER HEMI-ARTHROPLASTY  06/05/2012   Procedure: SHOULDER HEMI-ARTHROPLASTY;  Surgeon: Fonda SHAUNNA Olmsted, MD;  Location: [facility] OR;  Service: Orthopedics;  Laterality: Right;  FOR ARTHRITIS   SMALL INTESTINE SURGERY     TOTAL HIP ARTHROPLASTY  FALL OF 2008   rt. partial hip replacement   TOTAL SHOULDER ARTHROPLASTY  06/05/2012   Procedure: TOTAL SHOULDER ARTHROPLASTY;  Surgeon: Fonda SHAUNNA Olmsted, MD;  Location: [facility] OR;  Service: Orthopedics;  Laterality: Right;  RIGHT SHOULDER TOTAL ARTHROPLASTY, HEMIARTHROPLASTY, SHOULDER,  FOR ARTHRITIS   VIDEO BRONCHOSCOPY Bilateral 01/28/2015   Procedure: VIDEO BRONCHOSCOPY WITH FLUORO;  Surgeon: Lamar GORMAN Chris, MD;  Location: [facility] ENDOSCOPY;  Service: Cardiopulmonary;  Laterality: Bilateral;   VIDEO BRONCHOSCOPY Bilateral 04/23/2019   Procedure: VIDEO BRONCHOSCOPY WITHOUT FLUORO;  Surgeon: Chris Lamar GORMAN, MD;  Location: [facility] ENDOSCOPY;  Service: Cardiopulmonary;  Laterality: Bilateral;       Prior to Admission medications   Medication Sig Start Date End Date Taking? Authorizing Provider  acetaminophen  (TYLENOL ) 500 MG tablet Take 1,000 mg by mouth 2 (two) times daily as needed for headache (pain).   Yes [provider]  aspirin  81 MG tablet Take 1 tablet (81 mg total) by mouth daily. 01/23/18  Yes  McCue, Harlene, NP  atorvastatin  (LIPITOR) 40 MG tablet TAKE 1 TABLET(40 MG) BY MOUTH DAILY Patient taking differently: Take 40 mg by mouth at bedtime. 02/19/24  Yes Katrinka Garnette KIDD, MD  Calcium  Carb-Cholecalciferol (CALCIUM  + VITAMIN D3 PO) Take 1 tablet by mouth 2 (two) times daily.   Yes [provider]  DULoxetine  (CYMBALTA ) 30 MG capsule TAKE 2 CAPSULE BY MOUTH EVERY MORNING AND 1 CAPSULE BY MOUTH EVERY EVENING Patient taking differently: Take 30 mg by mouth 2 (two) times daily. 07/10/23  Yes Katrinka Garnette KIDD, MD  famotidine  (PEPCID ) 20 MG tablet TAKE 1 TABLET(20 MG) BY MOUTH TWICE DAILY 06/08/23  Yes Katrinka Garnette KIDD, MD  gabapentin  (NEURONTIN ) 300 MG capsule TAKE 1 CAPSULE(300 MG) BY MOUTH TWICE DAILY AS NEEDED FOR PAIN 04/16/24  Yes Katrinka Garnette KIDD, MD  losartan  (COZAAR ) 25 MG tablet Take 12.5 mg by mouth daily. 02/10/22  Yes [provider]  Multiple Vitamins-Minerals (MULTIVITAMIN WOMEN 50+) TABS Take 1 tablet by mouth 2 (two) times daily.   Yes [provider]  Polyethyl Glycol-Propyl Glycol (SYSTANE OP) Place 1 drop into both eyes 2 (two) times daily as needed (eye irritation).   Yes [provider]  sucralfate  (CARAFATE ) 1 g tablet Take 1 tablet (1 g total) by mouth 4 (four) times daily. Dissolve each tablet in 15 cc water  before use. Patient taking differently: Take 1 g by mouth 4 (four) times daily as needed (somach irritation). 09/29/22  Yes Dewey Rush, MD  zolpidem  (AMBIEN ) 5 MG tablet TAKE 1 TABLET BY MOUTH EVERY DAY AT BEDTIME FOR SLEEP 01/05/24  Yes Katrinka Garnette KIDD, MD    Allergies: Zithromax  [azithromycin ] and Celebrex [celecoxib]    Review of Systems  Musculoskeletal:  Positive for arthralgias.    Updated Vital Signs BP 131/73 (BP Location: Left Arm)   Pulse 89   Temp 97.9 F (36.6 C)   Resp 15   Ht 5' 5 (1.651 m)   Wt 64.3 kg   LMP 08/25/1992   SpO2 95%   BMI 23.60 kg/m   Physical Exam Vitals and nursing note reviewed.   Constitutional:      General: She is not in acute distress.    Appearance: She is well-developed.  HENT:     Head: Normocephalic and atraumatic.  Eyes:     Conjunctiva/sclera: Conjunctivae normal.  Neck:     Comments: Midline cervical tenderness Cardiovascular:     Rate and Rhythm: Normal rate and regular rhythm.     Heart sounds: No murmur heard. Pulmonary:     Effort: Pulmonary effort is normal. No respiratory distress.     Breath sounds: Normal breath sounds.  Abdominal:     Palpations: Abdomen is soft.     Tenderness: There is no  abdominal tenderness.  Musculoskeletal:        General: No swelling.     Cervical back: Neck supple.     Comments: Anterior hip tenderness, discomfort with flexion of hip, no obvious shortening  Skin:    General: Skin is warm and dry.     Capillary Refill: Capillary refill takes less than 2 seconds.  Neurological:     Mental Status: She is alert.     Comments: Patient is alert and oriented. There is no abnormal phonation. Symmetric smile without facial droop.  Moves all extremities spontaneously. 5/5 strength in upper and lower extremities. . No sensation deficit. There is no nystagmus. EOMI, PERRL.    Psychiatric:        Mood and Affect: Mood normal.     (all labs ordered are listed, but only abnormal results are displayed) Labs Reviewed  CBC WITH DIFFERENTIAL/PLATELET - Abnormal; Notable for the following components:      Result Value   MCHC 29.9 (*)    All other components within normal limits  COMPREHENSIVE METABOLIC PANEL WITH GFR - Abnormal; Notable for the following components:   BUN 33 (*)    Creatinine, Ser 1.83 (*)    ALT 48 (*)    GFR, Estimated 27 (*)    All other components within normal limits  CK - Abnormal; Notable for the following components:   Total CK 37 (*)    All other components within normal limits  COMPREHENSIVE METABOLIC PANEL WITH GFR - Abnormal; Notable for the following components:   Potassium 5.2 (*)     BUN 33 (*)    Creatinine, Ser 1.83 (*)    Total Protein 6.3 (*)    GFR, Estimated 27 (*)    All other components within normal limits  CBC - Abnormal; Notable for the following components:   Hemoglobin 11.6 (*)    All other components within normal limits  BASIC METABOLIC PANEL WITH GFR - Abnormal; Notable for the following components:   Glucose, Bld 117 (*)    BUN 31 (*)    Creatinine, Ser 1.88 (*)    GFR, Estimated 26 (*)    All other components within normal limits  MAGNESIUM   VITAMIN D  25 HYDROXY (VIT D DEFICIENCY, FRACTURES)    EKG: None  Radiology: CT Hip Left Wo Contrast Result Date: 05/24/2024 CLINICAL DATA:  Hip trauma, fracture suspected, xray done Left hip pain after falling. EXAM: CT OF THE LEFT HIP WITHOUT CONTRAST TECHNIQUE: Multidetector CT imaging of the left hip was performed according to the standard protocol. Multiplanar CT image reconstructions were also generated. RADIATION DOSE REDUCTION: This exam was performed according to the departmental dose-optimization program which includes automated exposure control, adjustment of the mA and/or kV according to patient size and/or use of iterative reconstruction technique. COMPARISON:  Radiographs 05/24/2024.  Left hip MRI 12/19/2023 FINDINGS: Bones/Joint/Cartilage There are nondisplaced fractures of the left superior and inferior pubic rami. The fracture of the left superior pubic ramus extends into the medial wall of the left acetabulum. The left femoral head is located and intact. No evidence of proximal femur fracture or osteonecrosis. There are mild left hip degenerative changes without significant joint effusion. The left sacroiliac joint and symphysis pubis appear intact. Ligaments Suboptimally assessed by CT. Muscles and Tendons Unremarkable. Soft tissues No evidence of significant pelvic hematoma. There are postsurgical changes consistent with previous left inguinal herniorrhaphy. Diverticular changes in the sigmoid  colon without evidence of acute inflammation. Mild iliofemoral atherosclerosis. IMPRESSION:  1. Nondisplaced fractures of the left superior and inferior pubic rami. The fracture of the left superior pubic ramus extends into the medial wall of the left acetabulum. 2. No evidence of proximal femur fracture, dislocation or osteonecrosis. 3. Mild left hip degenerative changes. Electronically Signed   By: Elsie Perone M.D.   On: 05/24/2024 14:47   CT Cervical Spine Wo Contrast Result Date: 05/24/2024 EXAM: CT CERVICAL SPINE WITHOUT CONTRAST 05/24/2024 02:33:36 PM TECHNIQUE: CT of the cervical spine was performed without the administration of intravenous contrast. Multiplanar reformatted images are provided for review. Automated exposure control, iterative reconstruction, and/or weight based adjustment of the mA/kV was utilized to reduce the radiation dose to as low as reasonably achievable. COMPARISON: None available. CLINICAL HISTORY: Neck trauma (Age >= 65y). Table formatting from the original note was not included.; BIB EMS due to fall and left hip pain, sharp pain when trying to stand and radiates No noted shortening or rotation, she got tangled in dog leash fell back and hit head on cabinet. 150/100-90-95% RA FINDINGS: CERVICAL SPINE: BONES AND ALIGNMENT: Straightening of the normal cervical lordosis is present. Slight degenerative anterolisthesis is present at C3-C4 and C4-C5. No acute fracture or traumatic malalignment. DEGENERATIVE CHANGES: Slight degenerative anterolisthesis is present at C3-C4 and C4-C5. Chronic endplate changes are present at C3-C4, C5-C6 and C6-C7. SOFT TISSUES: No prevertebral soft tissue swelling. 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 IMPRESSION: 1. No acute abnormality of the cervical spine related to neck trauma. 2. Straightening of the normal cervical lordosis. 3. Slight degenerative anterolisthesis at  C3-4 and C4-5. 4. Chronic endplate changes at C3-4, C5-6, and C6-7. Electronically signed by: Lonni Necessary MD 05/24/2024 02:44 PM EDT RP Workstation: [id number]   CT Head Wo Contrast Result Date: 05/24/2024 EXAM: CT HEAD WITHOUT CONTRAST 05/24/2024 02:33:36 PM TECHNIQUE: CT of the head was performed without the administration of intravenous contrast. Automated exposure control, iterative reconstruction, and/or weight based adjustment of the mA/kV was utilized to reduce the radiation dose to as low as reasonably achievable. COMPARISON: None available. CLINICAL HISTORY: BIB EMS due to fall and left hip pain, sharp pain when trying to stand and radiates No noted shortening or rotation, she got tangled in dog leash fell back and hit head on cabinet. 150/100-90-95% RA FINDINGS: BRAIN AND VENTRICLES: No acute hemorrhage. No evidence of acute infarct. No hydrocephalus. No extra-axial collection. No mass effect or midline shift. Age-related cerebral volume loss and mild-to-moderate periventricular and deep cerebral white matter disease. 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 Mild calcific atheromatous disease. ORBITS: No acute abnormality. Status post bilateral lens replacement. SINUSES: No acute abnormality. SOFT TISSUES AND SKULL: No acute soft tissue abnormality. No skull fracture. IMPRESSION: 1. No acute intracranial abnormality related to the reported fall and head trauma. 2. Age-related cerebral volume loss and mild-to-moderate periventricular and deep cerebral white matter disease. 3. 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 Electronically signed by: Lonni Necessary MD 05/24/2024 02:39 PM EDT RP Workstation: [id number]   DG Hip Unilat With Pelvis 2-3 Views Left Result Date: 05/24/2024 CLINICAL DATA:  Fall, left hip and groin pain EXAM: DG HIP (WITH OR WITHOUT PELVIS) 2-3V LEFT COMPARISON:  None Available. FINDINGS: Prior right hip replacement. Fractures in the left  superior and inferior pubic rami. The superior pubic ramus fracture courses toward the left acetabulum and possibly involves the medial wall. No subluxation or dislocation. No femoral neck fracture. IMPRESSION: Left superior and inferior  pubic rami fractures. Possible involvement of the medial acetabulum. Electronically Signed   By: Franky Crease M.D.   On: 05/24/2024 12:38     Procedures   Medications Ordered in the ED  zolpidem  (AMBIEN ) tablet 5 mg (5 mg Oral Given 05/25/24 2041)  DULoxetine  (CYMBALTA ) DR capsule 60 mg (60 mg Oral Given 05/25/24 1006)    And  DULoxetine  (CYMBALTA ) DR capsule 30 mg (30 mg Oral Given 05/25/24 2040)  famotidine  (PEPCID ) tablet 20 mg (20 mg Oral Given 05/25/24 1006)  gabapentin  (NEURONTIN ) capsule 200 mg (200 mg Oral Given 05/25/24 2041)  heparin  injection 5,000 Units (5,000 Units Subcutaneous Given 05/26/24 0603)  acetaminophen  (TYLENOL ) tablet 650 mg (650 mg Oral Given 05/26/24 0443)    Or  acetaminophen  (TYLENOL ) suppository 650 mg ( Rectal See Alternative 05/26/24 0443)  oxyCODONE  (Oxy IR/ROXICODONE ) immediate release tablet 5 mg (5 mg Oral Given 05/26/24 0443)  HYDROmorphone  (DILAUDID ) injection 0.5 mg (0.5 mg Intravenous Given 05/25/24 1949)  atorvastatin  (LIPITOR) tablet 40 mg (40 mg Oral Given 05/25/24 1235)  sucralfate  (CARAFATE ) tablet 1 g (1 g Oral Patient Refused/Not Given 05/25/24 2035)  aspirin  EC tablet 81 mg (81 mg Oral Given 05/25/24 1235)  polyethylene glycol (MIRALAX  / GLYCOLAX ) packet 17 g (17 g Oral Patient Refused/Not Given 05/25/24 2035)  oxyCODONE  (Oxy IR/ROXICODONE ) immediate release tablet 5 mg (5 mg Oral Given 05/24/24 1303)  acetaminophen  (TYLENOL ) tablet 1,000 mg (1,000 mg Oral Given 05/24/24 1552)  oxyCODONE  (Oxy IR/ROXICODONE ) immediate release tablet 5 mg (5 mg Oral Given 05/24/24 1837)  sodium zirconium cyclosilicate (LOKELMA) packet 10 g (10 g Oral Given 05/25/24 1007)  sodium chloride  0.9 % bolus 500 mL (500 mLs Intravenous  New Bag/Given 05/25/24 1540)  sodium zirconium cyclosilicate (LOKELMA) packet 10 g (10 g Oral Given 05/25/24 1629)    Clinical Course as of 05/26/24 0649  Fri May 24, 2024  1218 Patient evaluated following mechanical fall, hitting her head without LOC.  Primarily complaining of neck and left hip pain.  Patient is brought in via EMS and upon arrival she is hemodynamically stable.  She has tenderness limiting her midline cervical spine.  Maintained in c-collar at this time.  She has tenderness to anterior left hip.  She has no neurodeficits on exam.  Will proceed with CT imaging of head, cervical spine and left hip as she is nonambulatory at this time [JT]  1426 Left inferior and superior [JT]  1426 DG Hip Unilat With Pelvis 2-3 Views Left Left inferior and superior pubic rami fractures [JT]  1514 Will consult Ortho [JT]  1651 Discussed with orthopedics.  They will review CT. [JT]  1716 Dr. Georgina reviewed CT, recommended WBAT with walker and outpatient follow-up in 2 weeks.  If unable to ambulate will be admitted to medicine service. [JT]  1818 Unsuccessful ambulation, will pursue admission [JT]  1837 Discussed patient with Dr. Gonfa, agreed for admission [JT]    Clinical Course User Index [JT] Donnajean Lynwood DEL, PA-C                                 Medical Decision Making Amount and/or Complexity of Data Reviewed Labs: ordered. Radiology: ordered. Decision-making details documented in ED Course.  Risk OTC drugs. Prescription drug management. Decision regarding hospitalization.   This patient presents to the ED with chief complaint(s) of fall .  The complaint involves an extensive differential diagnosis and also carries with it a high  risk of complications and morbidity.   Pertinent past medical history as listed in HPI  The differential diagnosis includes  Fracture, dislocation, sprain, intracranial hemorrhage Additional history obtained: Records reviewed Care Everywhere/External  Records  Disposition:   Patient admitted for further workup and management  Social Determinants of Health:   none  This note was dictated with voice recognition software.  Despite best efforts at proofreading, errors may have occurred which can change the documentation meaning.       Final diagnoses:  Fall, initial encounter    ED Discharge Orders     None          Donnajean Lynwood DEL, PA-C 05/26/24 [id number]    Bari Roxie HERO, DO 05/27/24 [phone]

## 2024-05-24 NOTE — ED Notes (Signed)
 Visitor at bedside.

## 2024-05-24 NOTE — ED Triage Notes (Signed)
 BIB EMS due to fall and left hip pain, sharp pain when trying to stand and radiates No noted shortening or rotation, she got tangled in dog leash fell back and hit head on cabinet. 150/100-90-95% RA

## 2024-05-24 NOTE — H&P (Signed)
 History and Physical    Patient: Sandra Craig FMW:[id number] DOB: January 02, 1941 DOA: 05/24/2024 DOS: the patient was seen and examined on 05/24/2024 PCP: Katrinka Garnette KIDD, MD  Patient coming from: Home.  Independently ambulates at baseline.  Primary caregiver for her husband who has a cancer.  Chief Complaint:  Chief Complaint  Patient presents with   Fall   Hip Pain   HPI: Sandra Craig is a 83 y.o. female with PMH of CKD-4, chronic low back pain, depression, anxiety, insomnia, HTN, IBS and fibromyalgia brought to ED by EMS after she sustained mechanical fall.  Patient tangled on a leash of her dog and fell on hard ground.  She felt sharp and severe left hip pain when she tried to stand.  She also reports hitting her head but denies passing out.  She denies headache.  Denies any prodromes leading to this fall.  She says she is not in pain as long as she stays still.  However, pain is very severe when she tries to move.  Pain is mainly in her left hip/groin.  Denies bowel or bladder habit changes.  Denies chest pain, shortness of breath, dizziness, nausea, vomiting, fever and focal weakness, numbness and tingling.  She reports taking her morning medication including baby aspirin , duloxetine , Pepcid  and gabapentin .   Patient denies smoking cigarettes, drinking alcohol recreational drug use.  She is interested in cardiopulmonary cessation in event of sudden cardiopulmonary arrest.  In ED, elevated to 186/93 but improved to 148/85. Cr 1.83 (baseline).  BUN 33.  ALT 48.  Otherwise, CMP and CBC without significant finding.  CK 37.  Pelvic x-ray and CT left hip showed nondisplaced left superior and inferior rami fractures with possible involvement of the medial acetabulum.  CT head and CT cervical spine without acute finding.  Per EDP, case discussed with on-call orthopedic surgeon, Dr. Ozell Ada recommended pain control and weightbearing as tolerated.  Unfortunately, patient was not able to  walk due to severe pain, and admission requested.  Review of Systems: As mentioned in the history of present illness. All other systems reviewed and are negative. 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   Chronic insomnia    Chronic kidney disease    stage 4   Cystitis    Depression    Diverticulitis of intestine without perforation or abscess without bleeding    Patient did have abscess but noperforation   Diverticulosis of colon (without mention of hemorrhage)    Endometriosis    Family history of malignant neoplasm of gastrointestinal tract    Fibromyalgia    Fractures involving multiple body regions    Gastritis    GERD (gastroesophageal reflux disease)    Hiatal hernia    HIATAL HERNIA 10/08/2008   Qualifier: Diagnosis of  By: Genie CMA LEODIS), Chick     History of gallstones    HSV-1 infection    Hyperlipidemia    Hypertension    IBS (irritable bowel syndrome)    IC (interstitial cystitis)    Internal hemorrhoid    Mycobacterium avium complex (HCC)    history- took antibiotics and completed course   Non-small cell lung cancer (HCC)    in remission   Osteonecrosis (HCC)    Osteopenia    Osteoporosis    Palpitations    Polycystic kidney disease    pt denies   Pulmonary nodule 07/2007   5  mm Anterior RUL   Small bowel obstruction (HCC)    Stroke (HCC) 2021   TIA   Past Surgical History:  Procedure Laterality Date   ABDOMINAL HYSTERECTOMY  08/01/1992   TAH,BSO FOR ENDOMETRIOSIS   ABDOMINAL SURGERY  08/01/2009   small intestine blockage   CHOLECYSTECTOMY     GASTROPLASTY  08/01/2009   small bowel resection -open   INGUINAL HERNIA REPAIR Right 02/28/2020   Procedure: LAPAROSCOPIC RIGHT INGUINAL HERNIA REPAIR WITH MESH;  Surgeon: Rubin Calamity, MD;  Location: [facility] OR;  Service: General;  Laterality: Right;   JOINT REPLACEMENT  08/01/2006   OOPHORECTOMY  08/01/1992   TAH,BSO    PELVIC LAPAROSCOPY     S/P right shoulder rotater cuff  200216/2011   Tear/adhesive capsulitis   SBO Lap  08/01/2009   Adhesions and small internal hernia   SHOULDER HEMI-ARTHROPLASTY  06/05/2012   Procedure: SHOULDER HEMI-ARTHROPLASTY;  Surgeon: Fonda SHAUNNA Olmsted, MD;  Location: [facility] OR;  Service: Orthopedics;  Laterality: Right;  FOR ARTHRITIS   SMALL INTESTINE SURGERY     TOTAL HIP ARTHROPLASTY  FALL OF 2008   rt. partial hip replacement   TOTAL SHOULDER ARTHROPLASTY  06/05/2012   Procedure: TOTAL SHOULDER ARTHROPLASTY;  Surgeon: Fonda SHAUNNA Olmsted, MD;  Location: [facility] OR;  Service: Orthopedics;  Laterality: Right;  RIGHT SHOULDER TOTAL ARTHROPLASTY, HEMIARTHROPLASTY, SHOULDER, FOR ARTHRITIS   VIDEO BRONCHOSCOPY Bilateral 01/28/2015   Procedure: VIDEO BRONCHOSCOPY WITH FLUORO;  Surgeon: Lamar GORMAN Chris, MD;  Location: [facility] ENDOSCOPY;  Service: Cardiopulmonary;  Laterality: Bilateral;   VIDEO BRONCHOSCOPY Bilateral 04/23/2019   Procedure: VIDEO BRONCHOSCOPY WITHOUT FLUORO;  Surgeon: Chris Lamar GORMAN, MD;  Location: [facility] ENDOSCOPY;  Service: Cardiopulmonary;  Laterality: Bilateral;   Social History:  reports that she quit smoking about 47 years ago. Her smoking use included cigarettes. She started smoking about 55 years ago. She has a 6 pack-year smoking history. She has never used smokeless tobacco. She reports that she does not drink alcohol and does not use drugs.  Allergies  Allergen Reactions   Azithromycin  Rash    Pruritic rash diffuse     Celecoxib Nausea Only    Family History  Problem Relation Age of Onset   Heart disease Mother        MI at age 63   Emphysema Mother    Thyroid  disease Mother        Thyroidectomy/Benign   COPD Father    Pancreatic cancer Sister        died 69   Cancer Sister    Cancer Brother        adenocarcinoma right lung   COPD Brother    Lymphoma Maternal Grandmother    Colon cancer Paternal Grandmother    Heart attack Paternal Grandfather     Prior to  Admission medications   Medication Sig Start Date End Date Taking? Authorizing Provider  aspirin  81 MG tablet Take 1 tablet (81 mg total) by mouth daily. 01/23/18   Whitfield Raisin, NP  atorvastatin  (LIPITOR) 40 MG tablet TAKE 1 TABLET(40 MG) BY MOUTH DAILY 02/19/24   Katrinka Garnette KIDD, MD  Calcium  Citrate-Vitamin D  (CALCIUM  + D PO) Take 1 tablet by mouth 2 (two) times daily.    [provider]  DULoxetine  (CYMBALTA ) 30 MG capsule TAKE 2 CAPSULE BY MOUTH EVERY MORNING AND 1 CAPSULE BY MOUTH EVERY EVENING 07/10/23   Katrinka Garnette KIDD, MD  famotidine  (PEPCID ) 20 MG tablet TAKE 1 TABLET(20 MG) BY MOUTH TWICE DAILY 06/08/23  Katrinka Garnette KIDD, MD  gabapentin  (NEURONTIN ) 300 MG capsule TAKE 1 CAPSULE(300 MG) BY MOUTH TWICE DAILY AS NEEDED FOR PAIN 04/16/24   Katrinka Garnette KIDD, MD  losartan  (COZAAR ) 25 MG tablet Take 12.5 mg by mouth daily. 02/10/22   [provider]  Multiple Vitamins-Minerals (MULTIVITAMIN WITH MINERALS) tablet Take 1 tablet by mouth daily.    [provider]  Polyethyl Glycol-Propyl Glycol (SYSTANE) 0.4-0.3 % GEL ophthalmic gel Place 1 application into both eyes 3 (three) times daily as needed (dry eye).    [provider]  sucralfate  (CARAFATE ) 1 g tablet Take 1 tablet (1 g total) by mouth 4 (four) times daily. Dissolve each tablet in 15 cc water  before use. 09/29/22   Dewey Rush, MD  zolpidem  (AMBIEN ) 5 MG tablet TAKE 1 TABLET BY MOUTH EVERY DAY AT BEDTIME FOR SLEEP 01/05/24   Katrinka Garnette KIDD, MD    Physical Exam: Vitals:   05/24/24 1147 05/24/24 1309 05/24/24 1541 05/24/24 1946  BP:  (!) 186/93 (!) 148/85   Pulse:  79 87   Resp:  18 18   Temp:  (!) 97.4 F (36.3 C) 97.7 F (36.5 C) 97.6 F (36.4 C)  TempSrc:  Oral  Oral  SpO2:  100% 99%   Weight: 64.3 kg     Height: 5' 4.75 (1.645 m)      GENERAL: No apparent distress.  Nontoxic. HEENT: MMM.  Vision and hearing grossly intact.  NECK: Supple.  No apparent JVD.  RESP:  No IWOB.  Fair  aeration bilaterally. CVS:  RRR. Heart sounds normal.  ABD/GI/GU: BS+. Abd soft, NTND.  MSK/EXT:   No apparent deformity. No edema.  Wiggling toes.  Difficulty moving left leg due to pain. SKIN: no apparent skin lesion or wound NEURO: Awake and alert. Oriented appropriately.  No apparent focal neuro deficit. PSYCH: Calm. Normal affect.  Data Reviewed: See HPI  Assessment and Plan: Accidental fall-tangled on the leash of her dog and fell Left pelvic bone fractures-x-ray and CT showed nondisplaced acute left superior and inferior rami fracture extending into the medial wall of left acetabulum -Per EDP, orthopedic surgery Dr. Georgina recommended pain control and weightbearing as tolerated - Multimodal pain control with Tylenol , oxycodone  and IV Dilaudid  - Continue home gabapentin  at reduced dose - Continue home Cymbalta  -Check vitamin D  level - PT/OT eval - TOC consult  CKD-4: Stable - Monitor intermittently  Essential hypertension: BP elevated partly due to pain. - Resume home meds after med rec  Hyperlipidemia - Continue home statin  Anxiety, depression and insomnia - Continue home meds  GERD - Continue home meds   Advance Care Planning:   Code Status: Full Code -discussed with patient and patient's sister at bedside  Consults: Orthopedic surgery  Family Communication: Patient sister at bedside  Severity of Illness: The appropriate patient status for this patient is OBSERVATION. Observation status is judged to be reasonable and necessary in order to provide the required intensity of service to ensure the patient's safety. The patient's presenting symptoms, physical exam findings, and initial radiographic and laboratory data in the context of their medical condition is felt to place them at decreased risk for further clinical deterioration. Furthermore, it is anticipated that the patient will be medically stable for discharge from the hospital within 2 midnights of admission.    Author: Mignon ONEIDA Bump, MD 05/24/2024 8:24 PM  For on call review [email or web address].

## 2024-05-25 DIAGNOSIS — F419 Anxiety disorder, unspecified: Secondary | ICD-10-CM | POA: Diagnosis present

## 2024-05-25 DIAGNOSIS — K219 Gastro-esophageal reflux disease without esophagitis: Secondary | ICD-10-CM | POA: Diagnosis present

## 2024-05-25 DIAGNOSIS — Z7982 Long term (current) use of aspirin: Secondary | ICD-10-CM | POA: Diagnosis not present

## 2024-05-25 DIAGNOSIS — Y93K1 Activity, walking an animal: Secondary | ICD-10-CM | POA: Diagnosis not present

## 2024-05-25 DIAGNOSIS — M797 Fibromyalgia: Secondary | ICD-10-CM | POA: Diagnosis present

## 2024-05-25 DIAGNOSIS — Z8673 Personal history of transient ischemic attack (TIA), and cerebral infarction without residual deficits: Secondary | ICD-10-CM | POA: Diagnosis not present

## 2024-05-25 DIAGNOSIS — E876 Hypokalemia: Secondary | ICD-10-CM | POA: Diagnosis present

## 2024-05-25 DIAGNOSIS — Z96611 Presence of right artificial shoulder joint: Secondary | ICD-10-CM | POA: Diagnosis present

## 2024-05-25 DIAGNOSIS — M545 Low back pain, unspecified: Secondary | ICD-10-CM | POA: Diagnosis present

## 2024-05-25 DIAGNOSIS — Z886 Allergy status to analgesic agent status: Secondary | ICD-10-CM | POA: Diagnosis not present

## 2024-05-25 DIAGNOSIS — N184 Chronic kidney disease, stage 4 (severe): Secondary | ICD-10-CM | POA: Diagnosis present

## 2024-05-25 DIAGNOSIS — W1830XA Fall on same level, unspecified, initial encounter: Secondary | ICD-10-CM | POA: Diagnosis present

## 2024-05-25 DIAGNOSIS — K589 Irritable bowel syndrome without diarrhea: Secondary | ICD-10-CM | POA: Diagnosis present

## 2024-05-25 DIAGNOSIS — Z8249 Family history of ischemic heart disease and other diseases of the circulatory system: Secondary | ICD-10-CM | POA: Diagnosis not present

## 2024-05-25 DIAGNOSIS — F32A Depression, unspecified: Secondary | ICD-10-CM | POA: Diagnosis present

## 2024-05-25 DIAGNOSIS — G4700 Insomnia, unspecified: Secondary | ICD-10-CM | POA: Diagnosis present

## 2024-05-25 DIAGNOSIS — E875 Hyperkalemia: Secondary | ICD-10-CM | POA: Diagnosis present

## 2024-05-25 DIAGNOSIS — I129 Hypertensive chronic kidney disease with stage 1 through stage 4 chronic kidney disease, or unspecified chronic kidney disease: Secondary | ICD-10-CM | POA: Diagnosis present

## 2024-05-25 DIAGNOSIS — Z87891 Personal history of nicotine dependence: Secondary | ICD-10-CM | POA: Diagnosis not present

## 2024-05-25 DIAGNOSIS — Z881 Allergy status to other antibiotic agents status: Secondary | ICD-10-CM | POA: Diagnosis not present

## 2024-05-25 DIAGNOSIS — S32502A Unspecified fracture of left pubis, initial encounter for closed fracture: Secondary | ICD-10-CM

## 2024-05-25 DIAGNOSIS — D62 Acute posthemorrhagic anemia: Secondary | ICD-10-CM | POA: Diagnosis present

## 2024-05-25 DIAGNOSIS — E785 Hyperlipidemia, unspecified: Secondary | ICD-10-CM | POA: Diagnosis present

## 2024-05-25 DIAGNOSIS — S32592A Other specified fracture of left pubis, initial encounter for closed fracture: Secondary | ICD-10-CM | POA: Diagnosis present

## 2024-05-25 DIAGNOSIS — Z85118 Personal history of other malignant neoplasm of bronchus and lung: Secondary | ICD-10-CM | POA: Diagnosis not present

## 2024-05-25 DIAGNOSIS — G8929 Other chronic pain: Secondary | ICD-10-CM | POA: Diagnosis present

## 2024-05-25 DIAGNOSIS — Z79899 Other long term (current) drug therapy: Secondary | ICD-10-CM | POA: Diagnosis not present

## 2024-05-25 LAB — COMPREHENSIVE METABOLIC PANEL WITH GFR
ALT: 43 U/L (ref 0–44)
AST: 32 U/L (ref 15–41)
Albumin: 3.7 g/dL (ref 3.5–5.0)
Alkaline Phosphatase: 82 U/L (ref 38–126)
Anion gap: 9 (ref 5–15)
BUN: 33 mg/dL — ABNORMAL HIGH (ref 8–23)
CO2: 28 mmol/L (ref 22–32)
Calcium: 9.4 mg/dL (ref 8.9–10.3)
Chloride: 104 mmol/L (ref 98–111)
Creatinine, Ser: 1.83 mg/dL — ABNORMAL HIGH (ref 0.44–1.00)
GFR, Estimated: 27 mL/min — ABNORMAL LOW (ref >60)
Glucose, Bld: 99 mg/dL (ref 70–99)
Potassium: 5.2 mmol/L — ABNORMAL HIGH (ref 3.5–5.1)
Sodium: 140 mmol/L (ref 135–145)
Total Bilirubin: 0.4 mg/dL (ref 0.0–1.2)
Total Protein: 6.3 g/dL — ABNORMAL LOW (ref 6.5–8.1)

## 2024-05-25 LAB — CBC
HCT: 38.7 % (ref 36.0–46.0)
Hemoglobin: 11.6 g/dL — ABNORMAL LOW (ref 12.0–15.0)
MCH: 29 pg (ref 26.0–34.0)
MCHC: 30 g/dL (ref 30.0–36.0)
MCV: 96.8 fL (ref 80.0–100.0)
Platelets: 194 K/uL (ref 150–400)
RBC: 4 MIL/uL (ref 3.87–5.11)
RDW: 14 % (ref 11.5–15.5)
WBC: 7.3 K/uL (ref 4.0–10.5)
nRBC: 0 % (ref 0.0–0.2)

## 2024-05-25 MED ORDER — SUCRALFATE 1 G PO TABS
1.0000 g | ORAL_TABLET | Freq: Four times a day (QID) | ORAL | Status: DC
  Administered 2024-05-26 – 2024-05-29 (×13): 1 g via ORAL
  Filled 2024-05-25 (×13): qty 1

## 2024-05-25 MED ORDER — SODIUM CHLORIDE 0.9 % IV BOLUS
500.0000 mL | Freq: Once | INTRAVENOUS | Status: AC
  Administered 2024-05-25: 500 mL via INTRAVENOUS

## 2024-05-25 MED ORDER — LOSARTAN POTASSIUM 25 MG PO TABS: 12.5000 mg | ORAL_TABLET | Freq: Every day | ORAL | Status: DC

## 2024-05-25 MED ORDER — ASPIRIN 81 MG PO TBEC
81.0000 mg | DELAYED_RELEASE_TABLET | Freq: Every day | ORAL | Status: DC
  Administered 2024-05-25 – 2024-05-29 (×5): 81 mg via ORAL
  Filled 2024-05-25 (×5): qty 1

## 2024-05-25 MED ORDER — POLYETHYLENE GLYCOL 3350 17 G PO PACK
17.0000 g | PACK | Freq: Two times a day (BID) | ORAL | Status: AC
Start: 2024-05-25 — End: 2024-05-27
  Administered 2024-05-25 – 2024-05-26 (×3): 17 g via ORAL
  Filled 2024-05-25 (×3): qty 1

## 2024-05-25 MED ORDER — ASPIRIN 81 MG PO TABS: 81.0000 mg | ORAL_TABLET | Freq: Every day | ORAL | Status: DC

## 2024-05-25 MED ORDER — SODIUM ZIRCONIUM CYCLOSILICATE 10 G PO PACK
10.0000 g | PACK | Freq: Once | ORAL | Status: AC
  Administered 2024-05-25: 10 g via ORAL
  Filled 2024-05-25: qty 1

## 2024-05-25 MED ORDER — ATORVASTATIN CALCIUM 40 MG PO TABS
40.0000 mg | ORAL_TABLET | Freq: Every day | ORAL | Status: DC
  Administered 2024-05-25 – 2024-05-29 (×5): 40 mg via ORAL
  Filled 2024-05-25 (×5): qty 1

## 2024-05-25 MED ORDER — SODIUM ZIRCONIUM CYCLOSILICATE 10 G PO PACK
10.0000 g | PACK | Freq: Every day | ORAL | Status: AC
  Administered 2024-05-25: 10 g via ORAL
  Filled 2024-05-25: qty 1

## 2024-05-25 NOTE — Consult Note (Signed)
 Orthopedic Surgery Consult Note  Assessment: Patient is a 83 y.o. female with pelvic ring injury   Plan: -Operative plans: none -Okay for diet and dvt ppx from ortho perspective -Weight bearing status: as tolerated -PT evaluate and treat -Pain control -Wants to follow up with [facility] after discharge. Recommended follow up at approximately 2 weeks from date of injury    ___________________________________________________________________________   Reason for consult: Pelvic ring injury  History:  Patient is a 83 y.o. female who had a ground-level fall and noted immediate onset of left hip pain.  Was unable to weight-bear.  She presented to the ER where she was found to have a pelvic ring injury.  She was unable to weight-bear in the ER so she was admitted to the medicine service.  She is still having left hip pain.  No pain elsewhere.  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   Chronic insomnia    Chronic kidney disease    stage 4   Cystitis    Depression    Diverticulitis of intestine without perforation or abscess without bleeding    Patient did have abscess but noperforation   Diverticulosis of colon (without mention of hemorrhage)    Endometriosis    Family history of malignant neoplasm of gastrointestinal tract    Fibromyalgia    Fractures involving multiple body regions    Gastritis    GERD (gastroesophageal reflux disease)    Hiatal hernia    HIATAL HERNIA 10/08/2008   Qualifier: Diagnosis of  By: Genie CMA (AAMA), Chick     History of gallstones    HSV-1 infection    Hyperlipidemia    Hypertension    IBS (irritable bowel syndrome)    IC (interstitial cystitis)    Internal hemorrhoid    Mycobacterium avium complex (HCC)    history- took antibiotics and completed course   Non-small cell lung cancer (HCC)    in remission   Osteonecrosis (HCC)    Osteopenia    Osteoporosis     Palpitations    Polycystic kidney disease    pt denies   Pulmonary nodule 07/2007   5 mm Anterior RUL   Small bowel obstruction (HCC)    Stroke (HCC) 2021   TIA    Past Surgical History:  Procedure Laterality Date   ABDOMINAL HYSTERECTOMY  08/01/1992   TAH,BSO FOR ENDOMETRIOSIS   ABDOMINAL SURGERY  08/01/2009   small intestine blockage   CHOLECYSTECTOMY     GASTROPLASTY  08/01/2009   small bowel resection -open   INGUINAL HERNIA REPAIR Right 02/28/2020   Procedure: LAPAROSCOPIC RIGHT INGUINAL HERNIA REPAIR WITH MESH;  Surgeon: Rubin Calamity, MD;  Location: [facility] OR;  Service: General;  Laterality: Right;   JOINT REPLACEMENT  08/01/2006   OOPHORECTOMY  08/01/1992   TAH,BSO   PELVIC LAPAROSCOPY     S/P right shoulder rotater cuff  200216/2011   Tear/adhesive capsulitis   SBO Lap  08/01/2009   Adhesions and small internal hernia   SHOULDER HEMI-ARTHROPLASTY  06/05/2012   Procedure: SHOULDER HEMI-ARTHROPLASTY;  Surgeon: Fonda SHAUNNA Olmsted, MD;  Location: [facility] OR;  Service: Orthopedics;  Laterality: Right;  FOR ARTHRITIS   SMALL INTESTINE SURGERY     TOTAL HIP ARTHROPLASTY  FALL OF 2008   rt. partial hip replacement   TOTAL SHOULDER ARTHROPLASTY  06/05/2012   Procedure: TOTAL SHOULDER ARTHROPLASTY;  Surgeon: Fonda SHAUNNA Olmsted, MD;  Location:  [facility] OR;  Service: Orthopedics;  Laterality: Right;  RIGHT SHOULDER TOTAL ARTHROPLASTY, HEMIARTHROPLASTY, SHOULDER, FOR ARTHRITIS   VIDEO BRONCHOSCOPY Bilateral 01/28/2015   Procedure: VIDEO BRONCHOSCOPY WITH FLUORO;  Surgeon: Lamar GORMAN Chris, MD;  Location: [facility] ENDOSCOPY;  Service: Cardiopulmonary;  Laterality: Bilateral;   VIDEO BRONCHOSCOPY Bilateral 04/23/2019   Procedure: VIDEO BRONCHOSCOPY WITHOUT FLUORO;  Surgeon: Chris Lamar GORMAN, MD;  Location: [facility] ENDOSCOPY;  Service: Cardiopulmonary;  Laterality: Bilateral;     Physical Exam:  General: no acute distress, appears stated age Neurologic: alert, answering questions appropriately, following  commands Cardiovascular: regular rate, no cyanosis Respiratory: unlabored breathing on room air, symmetric chest rise Psychiatric: appropriate affect, normal cadence to speech  MSK:   -Bilateral upper extremities  No tenderness to palpation over extremity, no gross deformity, no open wounds Fires deltoid, biceps, triceps, wrist extensors, wrist flexors, finger extensors, finger flexors  AIN/PIN/IO intact  Palpable radial pulse  Sensation intact to light touch in median/ulnar/radial/axillary nerve distributions  Hand warm and well perfused  - Right lower extremity  No tenderness to palpation over extremity, no gross deformity, no open wounds, no pain with logroll Fires hip flexors, quadriceps, hamstrings, tibialis anterior, gastrocnemius and soleus, extensor hallucis longus Plantarflexes and dorsiflexes toes Sensation intact to light touch in sural, saphenous, tibial, deep peroneal, and superficial peroneal nerve distributions Foot warm and well perfused  -Left lower extremity  No tenderness to palpation over extremity, except around the hip.  Pain with logroll at the hip.  No gross deformity.  No open wounds. Fires hip flexors, quadriceps, hamstrings, tibialis anterior, gastrocnemius and soleus, extensor hallucis longus Plantarflexes and dorsiflexes toes Sensation intact to light touch in sural, saphenous, tibial, deep peroneal, and superficial peroneal nerve distributions Foot warm and well perfused  Imaging: CT of the pelvis from 05/24/2024 was independently reviewed and interpreted, showing a nondisplaced left pubic root fracture and a nondisplaced inferior ramus fracture.  Sacral fracture not visualized but the entire sacrum was not captured in this CT scan.  No dislocation seen.  No other fracture seen.   Patient name: Sandra Craig Patient MRN: [id number] Date: 05/25/24

## 2024-05-25 NOTE — Evaluation (Addendum)
 Physical Therapy Evaluation Patient Details Name: Sandra Craig MRN: [id number] DOB: 07/20/1941 Today's Date: 05/25/2024  History of Present Illness  83 y.o. female who had a ground-level fall and noted immediate onset of left hip pain.  Was unable to weight-bear.  She presented to the ER where she was found to have a pelvic ring injury, left upper and inferior pubic ramus fracture with involvement of the acetabulum medial wall. admitted to the medicine service and orthopedics consulted.  PMH: HTN, HLD, R TSA 2013, , R hemiarthroplsasty in 2008, fibromyalgia, CKD  Clinical Impression  Pt admitted with above diagnosis.  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 Pt is very motivated, tolerated OOB well. Anticipate slow but steady progress in acute setting. Continue to follow acutely Patient will benefit from continued inpatient follow up therapy, <3 hours/day   Pt currently with functional limitations due to the deficits listed below (see PT Problem List). Pt will benefit from acute skilled PT to increase their independence and safety with mobility to allow discharge.           If plan is discharge home, recommend the following: A lot of help with walking and/or transfers;Assist for transportation;Help with stairs or ramp for entrance;Assistance with cooking/housework   Can travel by private vehicle   No    Equipment Recommendations None recommended by PT  Recommendations for Other Services       Functional Status Assessment Patient has had a recent decline in their functional status and demonstrates the ability to make significant improvements in function in a reasonable and predictable amount of time.     Precautions / Restrictions Precautions Precautions:  Fall Restrictions Weight Bearing Restrictions Per Provider Order: No LLE Weight Bearing Per Provider Order: Weight bearing as tolerated      Mobility  Bed Mobility Overal bed mobility: Needs Assistance Bed Mobility: Supine to Sit     Supine to sit: Mod assist     General bed mobility comments: assist with LEs, bed pad used to complete scooting laterally and to EOB once in sitting    Transfers Overall transfer level: Needs assistance Equipment used: Rolling walker (2 wheels) Transfers: Sit to/from Stand, Bed to chair/wheelchair/BSC Sit to Stand: Mod assist Stand pivot transfers: Min assist         General transfer comment: cues for hand placement for STS; assist to steady and manage RW for stand pivot; unable to wt shift to take step(s) d/t pain    Ambulation/Gait               General Gait Details: unable d/t pain  Stairs            Wheelchair Mobility     Tilt Bed    Modified Rankin (Stroke Patients Only)       Balance Overall balance assessment: Needs assistance Sitting-balance support: Feet supported, Single extremity supported Sitting balance-Leahy Scale: Fair Sitting balance - Comments: not challenged d/t pain   Standing balance support: Reliant on assistive device for balance, During functional activity Standing balance-Leahy Scale: Poor Standing balance comment: heavy reliance on UEs d/t pain                             Pertinent Vitals/Pain  Pain Assessment Pain Assessment: Faces--10/10 pelvic region, L >R hip    Home Living Family/patient expects to be discharged to:: Private residence Living Arrangements: Spouse/significant other Available Help at Discharge: Family;Available PRN/intermittently Type of Home: House Home Access: Ramped entrance       Home Layout: One level Home Equipment: Agricultural Consultant (2 wheels) Additional Comments: husband undergoing chemo d/t lung CA - unable to assist - sons assisting husband     Prior Function Prior Level of Function : Independent/Modified Independent;Driving             Mobility Comments: independent ADLs Comments: independent; housekeeper 1x/wk     Extremity/Trunk Assessment   Upper Extremity Assessment Upper Extremity Assessment: Defer to OT evaluation    Lower Extremity Assessment Lower Extremity Assessment: RLE deficits/detail;LLE deficits/detail RLE Deficits / Details: AAROM grossly WFL, MMT at least 2+/5 with testing limited by pain LLE Deficits / Details: AAROM grossly WFL, MMT  at least 2+/5 with testing limited by pain       Communication   Communication Communication: No apparent difficulties    Cognition Arousal: Alert Behavior During Therapy: WFL for tasks assessed/performed   PT - Cognitive impairments: No apparent impairments                         Following commands: Intact       Cueing Cueing Techniques: Verbal cues     General Comments      Exercises General Exercises - Lower Extremity Ankle Circles/Pumps: AROM, 5 reps, Both   Assessment/Plan    PT Assessment Patient needs continued PT services  PT Problem List Decreased strength;Decreased activity tolerance;Decreased balance;Decreased mobility;Decreased knowledge of use of DME;Pain       PT Treatment Interventions DME instruction;Therapeutic exercise;Gait training;Functional mobility training;Therapeutic activities;Patient/family education    PT Goals (Current goals can be found in the Care Plan section)  Acute Rehab PT Goals Patient Stated Goal: back to independence PT Goal Formulation: With patient Time For Goal Achievement: 06/08/24 Potential to Achieve Goals: Good    Frequency Min 4X/week     Co-evaluation               AM-PAC PT 6 Clicks Mobility  Outcome Measure Help needed turning from your back to your side while in a flat bed without using bedrails?: A Lot Help needed moving from lying on your back to sitting on the  side of a flat bed without using bedrails?: A Lot Help needed moving to and from a bed to a chair (including a wheelchair)?: A Lot Help needed standing up from a chair using your arms (e.g., wheelchair or bedside chair)?: A Lot Help needed to walk in hospital room?: Total Help needed climbing 3-5 steps with a railing? : Total 6 Click Score: 10    End of Session Equipment Utilized During Treatment: Gait belt Activity Tolerance: Patient tolerated treatment well;Patient limited by pain Patient left: in chair;with call bell/phone within reach;with chair alarm set;with family/visitor present Nurse Communication: Mobility status PT Visit Diagnosis: Other abnormalities of gait and mobility (R26.89)    Time: 8863-8787 PT Time Calculation (min) (ACUTE ONLY): 36 min   Charges:   PT Evaluation $PT Eval Low Complexity: 1 Low PT Treatments $Therapeutic Activity: 8-22 mins PT General Charges $$ ACUTE PT VISIT: 1 Visit         Rexene, PT  Acute Rehab Dept [facility]) [phone]  05/25/2024   [facility] 05/25/2024, 12:13 PM

## 2024-05-25 NOTE — Plan of Care (Signed)
  Problem: Safety: Goal: Ability to remain free from injury will improve Outcome: Progressing   Problem: Pain Managment: Goal: General experience of comfort will improve and/or be controlled Outcome: Progressing   Problem: Elimination: Goal: Will not experience complications related to urinary retention Outcome: Progressing   Problem: Coping: Goal: Level of anxiety will decrease Outcome: Progressing

## 2024-05-25 NOTE — Evaluation (Signed)
 Occupational Therapy Evaluation Patient Details Name: Sandra Craig MRN: [id number] DOB: 03/31/1941 Today's Date: 05/25/2024   History of Present Illness   83 y.o. female who had a ground-level fall and noted immediate onset of left hip pain.  Was unable to weight-bear.  She presented to the ER where she was found to have a pelvic ring injury, left upper and inferior pubic ramus fracture with involvement of the acetabulum medial wall. admitted to the medicine service and orthopedics consulted.  PMH: HTN, HLD, R TSA 2013, , R hemiarthroplsasty in 2008, fibromyalgia, CKD     Clinical Impressions PTA, patient live sat home with husband who is undergoing medical tx thus sons are assisting but patient was indep prior with driving, amb, A/IADL's. Currently, patient presents with deficits outlined below (see OT Problem List for details) most significantly pain, decreased activity tolerance and balance with higher level processing delays (may be pain med related- will con't to assess) impacting functional mobility (lateral scoot txfr mod a, STS mod A) and BADL's (max-total A LB) performance. Patient will benefit from continued inpatient follow up therapy, <3 hours/day. Patient requires continued Acute care hospital level OT services to progress safety and functional performance and allow for discharge.       If plan is discharge home, recommend the following:   Two people to help with walking and/or transfers;A lot of help with bathing/dressing/bathroom;Assistance with cooking/housework;Assist for transportation;Help with stairs or ramp for entrance     Functional Status Assessment   Patient has had a recent decline in their functional status and demonstrates the ability to make significant improvements in function in a reasonable and predictable amount of time.     Equipment Recommendations   Wheelchair cushion (measurements OT);Wheelchair (measurements OT);Hospital bed;BSC/3in1       Precautions/Restrictions   Precautions Precautions: Fall Restrictions Weight Bearing Restrictions Per Provider Order: No LLE Weight Bearing Per Provider Order: Weight bearing as tolerated     Mobility Bed Mobility Overal bed mobility: Needs Assistance Bed Mobility: Sit to Supine     Supine to sit: Mod assist     General bed mobility comments: assist with LEs and to reposition to [facility]    Transfers Overall transfer level: Needs assistance Equipment used: Rolling walker (2 wheels) Transfers: Sit to/from Stand, Bed to chair/wheelchair/BSC Sit to Stand: Mod assist          Lateral/Scoot Transfers: Mod assist General transfer comment: cues for hand placement for STS; assist to steady and manage RW for stand pivot; unable to wt shift to take step(s) d/t pain      Balance Overall balance assessment: Needs assistance Sitting-balance support: Feet supported, Single extremity supported Sitting balance-Leahy Scale: Fair Sitting balance - Comments: not challenged d/t pain   Standing balance support: Reliant on assistive device for balance, During functional activity Standing balance-Leahy Scale: Poor Standing balance comment: heavy reliance on UEs d/t pain                           ADL either performed or assessed with clinical judgement   ADL Overall ADL's : Needs assistance/impaired Eating/Feeding: Set up;Sitting   Grooming: Wash/dry hands;Wash/dry face;Oral care;Sitting;Set up   Upper Body Bathing: Contact guard assist;Sitting   Lower Body Bathing: Total assistance;Bed level   Upper Body Dressing : Contact guard assist;Sitting   Lower Body Dressing: Total assistance;Bed level   Toilet Transfer:  (DABSC need)   Toileting- Clothing Manipulation and Hygiene: Total assistance;Bed level  Toileting - Clothing Manipulation Details (indicate cue type and reason): using purewick     Functional mobility during ADLs: Moderate assistance;Rolling walker (2  wheels);+2 for physical assistance;+2 for safety/equipment (lateral scoot or STEDY) General ADL Comments: pain limits all LB self care     Vision Baseline Vision/History: 0 No visual deficits              Pertinent Vitals/Pain Pain Assessment Pain Assessment: Faces Faces Pain Scale: Hurts whole lot Pain Location: pelvic area, L>RLE Pain Descriptors / Indicators: Grimacing, Guarding Pain Intervention(s): Limited activity within patient's tolerance, Monitored during session, Premedicated before session, Repositioned, Relaxation, Patient requesting pain meds-RN notified     Extremity/Trunk Assessment Upper Extremity Assessment Upper Extremity Assessment: Right hand dominant;Overall WFL for tasks assessed   Lower Extremity Assessment Lower Extremity Assessment: Defer to PT evaluation RLE Deficits / Details: AAROM grossly WFL, MMT at least 2+/5 with testing limited by pain LLE Deficits / Details: AAROM grossly WFL, MMT  at least 2+/5 with testing limited by pain   Cervical / Trunk Assessment Cervical / Trunk Assessment: Normal   Communication Communication Communication: No apparent difficulties   Cognition Arousal: Alert Behavior During Therapy: WFL for tasks assessed/performed Cognition: No apparent impairments             OT - Cognition Comments: none reported by patient nor sister but slower processing and decreased insight into need for calling for pain meds or strategies to assist with transfer etc, will continue to assess                 Following commands: Intact       Cueing  General Comments   Cueing Techniques: Verbal cues  no bruising, edema or SOB noted but BP just prior to session 81/52 with nursing thus back to bed with therapy           Home Living Family/patient expects to be discharged to:: Private residence Living Arrangements: Spouse/significant other Available Help at Discharge: Family;Available PRN/intermittently Type of Home:  House Home Access: Ramped entrance     Home Layout: One level     Bathroom Shower/Tub: Walk-in Pensions Consultant: Standard     Home Equipment: Agricultural Consultant (2 wheels);Tub bench;Grab bars - toilet;Hand held shower head   Additional Comments: husband undergoing chemo d/t lung CA - unable to assist - sons assisting husband      Prior Functioning/Environment Prior Level of Function : Independent/Modified Independent;Driving             Mobility Comments: independent ADLs Comments: independent; housekeeper 1x/wk    OT Problem List: Decreased activity tolerance;Impaired balance (sitting and/or standing);Decreased safety awareness;Cardiopulmonary status limiting activity;Pain   OT Treatment/Interventions: Self-care/ADL training;Therapeutic exercise;Neuromuscular education;Energy conservation;DME and/or AE instruction;Therapeutic activities;Cognitive remediation/compensation;Patient/family education;Balance training      OT Goals(Current goals can be found in the care plan section)   Acute Rehab OT Goals Patient Stated Goal: to standing better and try and walk OT Goal Formulation: With patient/family Time For Goal Achievement: 06/08/24 Potential to Achieve Goals: Fair ADL Goals Pt Will Perform Lower Body Bathing: with min assist;with adaptive equipment;sit to/from stand Pt Will Perform Lower Body Dressing: with min assist;with adaptive equipment;sit to/from stand Pt Will Transfer to Toilet: with +2 assist;with min assist;bedside commode;stand pivot transfer Pt Will Perform Toileting - Clothing Manipulation and hygiene: with min assist;sitting/lateral leans   OT Frequency:  Min 2X/week       AM-PAC OT 6 Clicks Daily Activity  Outcome Measure Help from another person eating meals?: A Little Help from another person taking care of personal grooming?: A Little Help from another person toileting, which includes using toliet, bedpan, or urinal?:  Total Help from another person bathing (including washing, rinsing, drying)?: A Lot Help from another person to put on and taking off regular upper body clothing?: A Little Help from another person to put on and taking off regular lower body clothing?: Total 6 Click Score: 13   End of Session Equipment Utilized During Treatment: Gait belt;Rolling walker (2 wheels) Nurse Communication: Mobility status;Need for lift equipment;Other (comment) (may need STEDY for next OOB with nursing, BP)  Activity Tolerance: Patient limited by pain Patient left: in bed;with call bell/phone within reach;with bed alarm set;with family/visitor present  OT Visit Diagnosis: Unsteadiness on feet (R26.81);Other abnormalities of gait and mobility (R26.89);History of falling (Z91.81);Pain Pain - Right/Left: Right Pain - part of body: Hip (pelvis)                Time: 8574-8544 OT Time Calculation (min): 30 min Charges:  OT General Charges $OT Visit: 1 Visit OT Evaluation $OT Eval Low Complexity: 1 Low OT Treatments $Therapeutic Activity: 8-22 mins  Magdalyn Arenivas OT/L Acute Rehabilitation Department  [phone]  05/25/2024, 4:03 PM

## 2024-05-25 NOTE — Progress Notes (Signed)
 [facility] PROGRESS NOTE    Progress Note  DETTA MELLIN  FMW:[id number] DOB: 1941-01-05 DOA: 05/24/2024 PCP: Katrinka Garnette KIDD, MD     Brief Narrative:   Sandra Craig is an 83 y.o. 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   Assessment/Plan:   Closed pelvic fracture secondary to mechanical fall: CT of the hip showed nondisplaced superior and inferior pubic ramus fracture extending into the medial wall of the acetabulum. ED physician discussed with Dr. Georgina who recommended pain control and weightbearing as tolerated. She was continued on her home dose of Cymbalta . Was started on Tylenol  oxycodone  and IV Dilaudid  for breakthrough pain. PT OT has been consulted, anticipate will need skilled nursing facility placement.  Mild hyperkalemia: Give 1 dose of Lokelma recheck BMP in the morning.  Chronic kidney disease stage IIIb/IV: Creatinine stable.  Acute blood loss anemia: Likely due to fracture minimal drop in hemoglobin came down from 12.3-11.6. Continue monitor intermittently.  Essential hypertension: Continue current home meds.  Hyperlipidemia: Continue statins.  Depression/anxiety/insomnia: Resume home regimen.  DVT prophylaxis: lovenox  Family Communication:none Status is: Observation The patient remains OBS appropriate and will d/c before 2 midnights.    Code Status:     Code Status Orders  (From admission, onward)           Start     Ordered   05/24/24 2024  Full code  Continuous       Question:  By:  Answer:  Consent: discussion documented in EHR   05/24/24 2023           Code Status History     Date  Active Date Inactive Code Status Order ID Comments User Context   08/05/2022 1151 08/07/2022 1825 Full Code [id number]  Ricke Con JAYSON DEVONNA Inpatient   07/04/2022 1810 07/05/2022 2013 Full Code [id number]  Jonel Lonni SQUIBB, MD Inpatient   06/02/2017 1821 06/03/2017 2026 Full Code [id number]  Jadine Toribio SQUIBB, MD Inpatient   02/11/2012 2223 02/17/2012 1421 Full Code [id number]  Marjory Woodroe LABOR, RN Inpatient      Advance Directive Documentation    Flowsheet Row Most Recent Value  Type of Advance Directive Healthcare Power of Attorney, Living will  Pre-existing out of facility DNR order (yellow form or pink MOST form) --  MOST Form in Place? --      IV Access:   Peripheral IV   Procedures and diagnostic studies:   CT Hip Left Wo Contrast Result Date: 05/24/2024 CLINICAL DATA:  Hip trauma, fracture suspected, xray done Left hip pain after falling. EXAM: CT OF THE LEFT HIP WITHOUT CONTRAST TECHNIQUE: Multidetector CT imaging of the left hip was performed according to the standard protocol. Multiplanar CT image reconstructions were also generated. RADIATION DOSE REDUCTION: This exam was performed according to the departmental dose-optimization program which includes automated exposure control, adjustment of the mA and/or kV according to patient size and/or use of iterative reconstruction technique. COMPARISON:  Radiographs 05/24/2024.  Left hip MRI 12/19/2023 FINDINGS: Bones/Joint/Cartilage There are nondisplaced fractures of the left superior and inferior pubic rami. The fracture of the left superior pubic ramus extends into the medial wall of the left acetabulum.  The left femoral head is located and intact. No evidence of proximal femur fracture or osteonecrosis. There are mild left hip degenerative changes without significant joint effusion. The left sacroiliac joint and symphysis pubis appear intact. Ligaments Suboptimally assessed by CT. Muscles and Tendons Unremarkable. Soft tissues No  evidence of significant pelvic hematoma. There are postsurgical changes consistent with previous left inguinal herniorrhaphy. Diverticular changes in the sigmoid colon without evidence of acute inflammation. Mild iliofemoral atherosclerosis. IMPRESSION: 1. Nondisplaced fractures of the left superior and inferior pubic rami. The fracture of the left superior pubic ramus extends into the medial wall of the left acetabulum. 2. No evidence of proximal femur fracture, dislocation or osteonecrosis. 3. Mild left hip degenerative changes. Electronically Signed   By: Elsie Perone M.D.   On: 05/24/2024 14:47   CT Cervical Spine Wo Contrast Result Date: 05/24/2024 EXAM: CT CERVICAL SPINE WITHOUT CONTRAST 05/24/2024 02:33:36 PM TECHNIQUE: CT of the cervical spine was performed without the administration of intravenous contrast. Multiplanar reformatted images are provided for review. Automated exposure control, iterative reconstruction, and/or weight based adjustment of the mA/kV was utilized to reduce the radiation dose to as low as reasonably achievable. COMPARISON: None available. CLINICAL HISTORY: Neck trauma (Age >= 65y). Table formatting from the original note was not included.; BIB EMS due to fall and left hip pain, sharp pain when trying to stand and radiates No noted shortening or rotation, she got tangled in dog leash fell back and hit head on cabinet. 150/100-90-95% RA FINDINGS: CERVICAL SPINE: BONES AND ALIGNMENT: Straightening of the normal cervical lordosis is present. Slight degenerative anterolisthesis is present at C3-C4 and C4-C5. No acute fracture or traumatic malalignment. DEGENERATIVE CHANGES: Slight degenerative anterolisthesis is present at C3-C4 and C4-C5. Chronic endplate changes are present at C3-C4, C5-C6 and C6-C7. SOFT TISSUES: No prevertebral soft tissue swelling. 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 IMPRESSION:  1. No acute abnormality of the cervical spine related to neck trauma. 2. Straightening of the normal cervical lordosis. 3. Slight degenerative anterolisthesis at C3-4 and C4-5. 4. Chronic endplate changes at C3-4, C5-6, and C6-7. Electronically signed by: Lonni Necessary MD 05/24/2024 02:44 PM EDT RP Workstation: [id number]   CT Head Wo Contrast Result Date: 05/24/2024 EXAM: CT HEAD WITHOUT CONTRAST 05/24/2024 02:33:36 PM TECHNIQUE: CT of the head was performed without the administration of intravenous contrast. Automated exposure control, iterative reconstruction, and/or weight based adjustment of the mA/kV was utilized to reduce the radiation dose to as low as reasonably achievable. COMPARISON: None available. CLINICAL HISTORY: BIB EMS due to fall and left hip pain, sharp pain when trying to stand and radiates No noted shortening or rotation, she got tangled in dog leash fell back and hit head on cabinet. 150/100-90-95% RA FINDINGS: BRAIN AND VENTRICLES: No acute hemorrhage. No evidence of acute infarct. No hydrocephalus. No extra-axial collection. No mass effect or midline shift. Age-related cerebral volume loss and mild-to-moderate periventricular and deep cerebral white matter disease. 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 Mild calcific atheromatous disease. ORBITS: No acute abnormality. Status post bilateral lens replacement. SINUSES: No acute abnormality. SOFT TISSUES AND SKULL: No acute soft tissue abnormality. No skull fracture. IMPRESSION: 1. No acute intracranial abnormality related to the reported fall and head trauma. 2. Age-related cerebral volume loss and mild-to-moderate periventricular and deep cerebral white matter disease. 3. 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 Electronically signed by: Lonni Necessary MD 05/24/2024 02:39 PM EDT RP Workstation: [id number]  DG Hip Unilat With Pelvis 2-3 Views Left Result Date: 05/24/2024 CLINICAL DATA:  Fall,  left hip and groin pain EXAM: DG HIP (WITH OR WITHOUT PELVIS) 2-3V LEFT COMPARISON:  None Available. FINDINGS: Prior right hip replacement. Fractures in the left superior and inferior pubic rami. The superior pubic ramus fracture courses toward the left acetabulum and possibly involves the medial wall. No subluxation or dislocation. No femoral neck fracture. IMPRESSION: Left superior and inferior pubic rami fractures. Possible involvement of the medial acetabulum. Electronically Signed   By: Franky Crease M.D.   On: 05/24/2024 12:38     Medical Consultants:   None.   Subjective:    CALA KRUCKENBERG relates her pain slightly better than yesterday.  Objective:    Vitals:   05/24/24 1946 05/24/24 2128 05/25/24 0148 05/25/24 0612  BP:  123/75 (!) 101/57 114/62  Pulse:  91 94 79  Resp:  17 16 16   Temp: 97.6 F (36.4 C) 97.9 F (36.6 C) 97.8 F (36.6 C) 97.6 F (36.4 C)  TempSrc: Oral  Oral Oral  SpO2:  98% 97% 97%  Weight:      Height:  5' 5 (1.651 m)     SpO2: 97 %   Intake/Output Summary (Last 24 hours) at 05/25/2024 0743 Last data filed at 05/25/2024 0620 Gross per 24 hour  Intake 360 ml  Output 700 ml  Net -340 ml   Filed Weights   05/24/24 1147  Weight: 64.3 kg    Exam: General exam: In no acute distress. Respiratory system: Good air movement and clear to auscultation. Cardiovascular system: S1 & S2 heard, RRR. No JVD. Gastrointestinal system: Abdomen is nondistended, soft and nontender.  Extremities: No pedal edema. Skin: No rashes, lesions or ulcers Psychiatry: Judgement and insight appear normal. Mood & affect appropriate.    Data Reviewed:    Labs: Basic Metabolic Panel: Recent Labs  Lab 05/24/24 1837 05/25/24 0409  NA 141 140  K 4.9 5.2*  CL 105 104  CO2 28 28  GLUCOSE 85 99  BUN 33* 33*  CREATININE 1.83* 1.83*  CALCIUM  9.6 9.4  MG 2.4  --    GFR Estimated Creatinine Clearance: 21.3 mL/min (A) (by C-G formula based on SCr of 1.83 mg/dL  (H)). Liver Function Tests: Recent Labs  Lab 05/24/24 1837 05/25/24 0409  AST 37 32  ALT 48* 43  ALKPHOS 84 82  BILITOT 0.4 0.4  PROT 6.6 6.3*  ALBUMIN  3.8 3.7   No results for input(s): LIPASE, AMYLASE in the last 168 hours. No results for input(s): AMMONIA in the last 168 hours. Coagulation profile No results for input(s): INR, PROTIME in the last 168 hours. COVID-19 Labs  No results for input(s): DDIMER, FERRITIN, LDH, CRP in the last 72 hours.  Lab Results  Component Value Date   SARSCOV2NAA NEGATIVE 08/03/2022   SARSCOV2NAA NEGATIVE 02/26/2020   SARSCOV2NAA RESULT:  NEGATIVE 09/10/2019   SARSCOV2NAA NOT DETECTED 04/20/2019    CBC: Recent Labs  Lab 05/24/24 1837 05/25/24 0409  WBC 7.8 7.3  NEUTROABS 5.8  --   HGB 12.3 11.6*  HCT 41.2 38.7  MCV 95.2 96.8  PLT 196 194   Cardiac Enzymes: Recent Labs  Lab 05/24/24 1837  CKTOTAL 37*   BNP (last 3 results) No results for input(s): PROBNP in the last 8760 hours. CBG: No results for input(s): GLUCAP in the last 168 hours. D-Dimer: No results for input(s): DDIMER in the last 72 hours. Hgb A1c: No results for  input(s): HGBA1C in the last 72 hours. Lipid Profile: No results for input(s): CHOL, HDL, LDLCALC, TRIG, CHOLHDL, LDLDIRECT in the last 72 hours. Thyroid  function studies: No results for input(s): TSH, T4TOTAL, T3FREE, THYROIDAB in the last 72 hours.  Invalid input(s): FREET3 Anemia work up: No results for input(s): VITAMINB12, FOLATE, FERRITIN, TIBC, IRON, RETICCTPCT in the last 72 hours. Sepsis Labs: Recent Labs  Lab 05/24/24 1837 05/25/24 0409  WBC 7.8 7.3   Microbiology No results found for this or any previous visit (from the past 240 hours).   Medications:    DULoxetine   60 mg Oral q AM   And   DULoxetine   30 mg Oral QHS   famotidine   20 mg Oral Daily   gabapentin   200 mg Oral BID   heparin   5,000 Units Subcutaneous Q8H    zolpidem   5 mg Oral QHS   Continuous Infusions:    LOS: 0 days   Erle Odell Castor  [facility]  05/25/2024, 7:43 AM

## 2024-05-25 NOTE — Progress Notes (Signed)
   05/25/24 1420  Assess: MEWS Score  Temp 97.7 F (36.5 C)  BP (!) 75/54  MAP (mmHg) (!) 63  Pulse Rate (!) 102  Resp 18  SpO2 96 %  Assess: MEWS Score  MEWS Temp 0  MEWS Systolic 2  MEWS Pulse 1  MEWS RR 0  MEWS LOC 0  MEWS Score 3  MEWS Score Color Yellow  Assess: if the MEWS score is Yellow or Red  Were vital signs accurate and taken at a resting state? Yes  Does the patient meet 2 or more of the SIRS criteria? No  MEWS guidelines implemented  Yes, yellow  Treat  MEWS Interventions Considered administering scheduled or prn medications/treatments as ordered  Take Vital Signs  Increase Vital Sign Frequency  Yellow: Q2hr x1, continue Q4hrs until patient remains green for 12hrs  Escalate  MEWS: Escalate Yellow: Discuss with charge nurse and consider notifying provider and/or RRT  Notify: Charge Nurse/RN  Name of Charge Nurse/RN Notified [employer]  Provider Notification  Provider Name/Title Dr. Celinda  Date Provider Notified 05/25/24  Time Provider Notified 1430  Method of Notification Page  Notification Reason Change in status (Yellow MEWS)  Provider response See new orders  Date of Provider Response 05/25/24  Time of Provider Response 1435  Assess: SIRS CRITERIA  SIRS Temperature  0  SIRS Respirations  0  SIRS Pulse 1  SIRS WBC 0  SIRS Score Sum  1

## 2024-05-26 DIAGNOSIS — S32502A Unspecified fracture of left pubis, initial encounter for closed fracture: Secondary | ICD-10-CM | POA: Diagnosis not present

## 2024-05-26 LAB — BASIC METABOLIC PANEL WITH GFR
Anion gap: 8 (ref 5–15)
BUN: 31 mg/dL — ABNORMAL HIGH (ref 8–23)
CO2: 30 mmol/L (ref 22–32)
Calcium: 9 mg/dL (ref 8.9–10.3)
Chloride: 105 mmol/L (ref 98–111)
Creatinine, Ser: 1.88 mg/dL — ABNORMAL HIGH (ref 0.44–1.00)
GFR, Estimated: 26 mL/min — ABNORMAL LOW (ref >60)
Glucose, Bld: 117 mg/dL — ABNORMAL HIGH (ref 70–99)
Potassium: 4.8 mmol/L (ref 3.5–5.1)
Sodium: 143 mmol/L (ref 135–145)

## 2024-05-26 LAB — CBC
HCT: 36.8 % (ref 36.0–46.0)
Hemoglobin: 10.8 g/dL — ABNORMAL LOW (ref 12.0–15.0)
MCH: 28.5 pg (ref 26.0–34.0)
MCHC: 29.3 g/dL — ABNORMAL LOW (ref 30.0–36.0)
MCV: 97.1 fL (ref 80.0–100.0)
Platelets: 170 10*3/uL (ref 150–400)
RBC: 3.79 MIL/uL — ABNORMAL LOW (ref 3.87–5.11)
RDW: 14 % (ref 11.5–15.5)
WBC: 6.4 10*3/uL (ref 4.0–10.5)
nRBC: 0 % (ref 0.0–0.2)

## 2024-05-26 MED ORDER — SORBITOL 70 % SOLN
30.0000 mL | Freq: Once | Status: AC
  Administered 2024-05-26: 30 mL via ORAL
  Filled 2024-05-26: qty 30

## 2024-05-26 MED ORDER — ENOXAPARIN SODIUM 30 MG/0.3ML IJ SOSY
30.0000 mg | PREFILLED_SYRINGE | INTRAMUSCULAR | Status: DC
  Administered 2024-05-26 – 2024-05-28 (×3): 30 mg via SUBCUTANEOUS
  Filled 2024-05-26 (×3): qty 0.3

## 2024-05-26 NOTE — Progress Notes (Signed)
 Patient and  Sister are requesting [employer] rehabilitation

## 2024-05-26 NOTE — Progress Notes (Addendum)
 [facility] PROGRESS NOTE    Progress Note  Sandra Craig  FMW:[id number] DOB: 1940-08-18 DOA: 05/24/2024 PCP: Katrinka Garnette KIDD, MD     Brief Narrative:   Sandra Craig is an 83 y.o. 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   Assessment/Plan:   Closed pelvic fracture secondary to mechanical fall: CT of the hip showed nondisplaced superior and inferior pubic ramus fracture extending into the medial wall of the acetabulum. Weightbearing as tolerated. She was continued on her home dose of Cymbalta . Continue Tylenol , oxycodone  and IV Dilaudid  for breakthrough pain. PT OT has been consulted, anticipate will need skilled nursing facility placement.  Mild hyperkalemia: Now resolved.  Chronic kidney disease stage IIIb/IV: Creatinine stable.  Acute blood loss anemia: Likely due to fracture minimal drop in hemoglobin came down from 12.3-11.6. Continue monitor intermittently.  Essential hypertension: Continue current home meds.  Hyperlipidemia: Continue statins.  Depression/anxiety/insomnia: Resume home regimen.  DVT prophylaxis: lovenox  Family Communication:none Status is: Observation The patient remains OBS appropriate and will d/c before 2 midnights.    Code Status:     Code Status Orders  (From admission, onward)           Start     Ordered   05/24/24 2024  Full code  Continuous       Question:  By:  Answer:  Consent: discussion documented in EHR   05/24/24 2023           Code Status History     Date Active Date Inactive Code Status Order ID Comments User Context   08/05/2022 1151 08/07/2022 1825 Full Code [id number]   Ricke Con JAYSON DEVONNA Inpatient   07/04/2022 1810 07/05/2022 2013 Full Code [id number]  Jonel Lonni SQUIBB, MD Inpatient   06/02/2017 1821 06/03/2017 2026 Full Code [id number]  Jadine Toribio SQUIBB, MD Inpatient   02/11/2012 2223 02/17/2012 1421 Full Code [id number]  Marjory Woodroe LABOR, RN Inpatient      Advance Directive Documentation    Flowsheet Row Most Recent Value  Type of Advance Directive Healthcare Power of Attorney, Living will  Pre-existing out of facility DNR order (yellow form or pink MOST form) --  MOST Form in Place? --      IV Access:   Peripheral IV   Procedures and diagnostic studies:   CT Hip Left Wo Contrast Result Date: 05/24/2024 CLINICAL DATA:  Hip trauma, fracture suspected, xray done Left hip pain after falling. EXAM: CT OF THE LEFT HIP WITHOUT CONTRAST TECHNIQUE: Multidetector CT imaging of the left hip was performed according to the standard protocol. Multiplanar CT image reconstructions were also generated. RADIATION DOSE REDUCTION: This exam was performed according to the departmental dose-optimization program which includes automated exposure control, adjustment of the mA and/or kV according to patient size and/or use of iterative reconstruction technique. COMPARISON:  Radiographs 05/24/2024.  Left hip MRI 12/19/2023 FINDINGS: Bones/Joint/Cartilage There are nondisplaced fractures of the left superior and inferior pubic rami. The fracture of the left superior pubic ramus extends into the medial wall of the left acetabulum. The left femoral head is located and intact. No evidence of proximal femur fracture or osteonecrosis. There are mild left hip  degenerative changes without significant joint effusion. The left sacroiliac joint and symphysis pubis appear intact. Ligaments Suboptimally assessed by CT. Muscles and Tendons Unremarkable. Soft tissues No evidence of significant pelvic hematoma. There are postsurgical changes consistent with previous left inguinal  herniorrhaphy. Diverticular changes in the sigmoid colon without evidence of acute inflammation. Mild iliofemoral atherosclerosis. IMPRESSION: 1. Nondisplaced fractures of the left superior and inferior pubic rami. The fracture of the left superior pubic ramus extends into the medial wall of the left acetabulum. 2. No evidence of proximal femur fracture, dislocation or osteonecrosis. 3. Mild left hip degenerative changes. Electronically Signed   By: Elsie Perone M.D.   On: 05/24/2024 14:47   CT Cervical Spine Wo Contrast Result Date: 05/24/2024 EXAM: CT CERVICAL SPINE WITHOUT CONTRAST 05/24/2024 02:33:36 PM TECHNIQUE: CT of the cervical spine was performed without the administration of intravenous contrast. Multiplanar reformatted images are provided for review. Automated exposure control, iterative reconstruction, and/or weight based adjustment of the mA/kV was utilized to reduce the radiation dose to as low as reasonably achievable. COMPARISON: None available. CLINICAL HISTORY: Neck trauma (Age >= 65y). Table formatting from the original note was not included.; BIB EMS due to fall and left hip pain, sharp pain when trying to stand and radiates No noted shortening or rotation, she got tangled in dog leash fell back and hit head on cabinet. 150/100-90-95% RA FINDINGS: CERVICAL SPINE: BONES AND ALIGNMENT: Straightening of the normal cervical lordosis is present. Slight degenerative anterolisthesis is present at C3-C4 and C4-C5. No acute fracture or traumatic malalignment. DEGENERATIVE CHANGES: Slight degenerative anterolisthesis is present at C3-C4 and C4-C5. Chronic endplate changes are present at C3-C4, C5-C6 and C6-C7. SOFT TISSUES: No prevertebral soft tissue swelling. 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 IMPRESSION: 1. No acute abnormality of the cervical spine related to neck trauma. 2. Straightening of the normal cervical  lordosis. 3. Slight degenerative anterolisthesis at C3-4 and C4-5. 4. Chronic endplate changes at C3-4, C5-6, and C6-7. Electronically signed by: Lonni Necessary MD 05/24/2024 02:44 PM EDT RP Workstation: [id number]   CT Head Wo Contrast Result Date: 05/24/2024 EXAM: CT HEAD WITHOUT CONTRAST 05/24/2024 02:33:36 PM TECHNIQUE: CT of the head was performed without the administration of intravenous contrast. Automated exposure control, iterative reconstruction, and/or weight based adjustment of the mA/kV was utilized to reduce the radiation dose to as low as reasonably achievable. COMPARISON: None available. CLINICAL HISTORY: BIB EMS due to fall and left hip pain, sharp pain when trying to stand and radiates No noted shortening or rotation, she got tangled in dog leash fell back and hit head on cabinet. 150/100-90-95% RA FINDINGS: BRAIN AND VENTRICLES: No acute hemorrhage. No evidence of acute infarct. No hydrocephalus. No extra-axial collection. No mass effect or midline shift. Age-related cerebral volume loss and mild-to-moderate periventricular and deep cerebral white matter disease. 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 Mild calcific atheromatous disease. ORBITS: No acute abnormality. Status post bilateral lens replacement. SINUSES: No acute abnormality. SOFT TISSUES AND SKULL: No acute soft tissue abnormality. No skull fracture. IMPRESSION: 1. No acute intracranial abnormality related to the reported fall and head trauma. 2. Age-related cerebral volume loss and mild-to-moderate periventricular and deep cerebral white matter disease. 3. 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 Electronically signed by: Lonni Necessary MD 05/24/2024 02:39 PM EDT RP Workstation: [id number]   DG Hip Unilat With Pelvis 2-3 Views Left Result Date: 05/24/2024 CLINICAL DATA:  Fall, left hip and groin  pain EXAM: DG HIP (WITH OR WITHOUT PELVIS) 2-3V LEFT COMPARISON:  None Available. FINDINGS:  Prior right hip replacement. Fractures in the left superior and inferior pubic rami. The superior pubic ramus fracture courses toward the left acetabulum and possibly involves the medial wall. No subluxation or dislocation. No femoral neck fracture. IMPRESSION: Left superior and inferior pubic rami fractures. Possible involvement of the medial acetabulum. Electronically Signed   By: Franky Crease M.D.   On: 05/24/2024 12:38     Medical Consultants:   None.   Subjective:    Sandra Craig pain is controlled has not had a bowel movement.  Objective:    Vitals:   05/25/24 1757 05/25/24 2039 05/25/24 2134 05/26/24 0609  BP: (!) 142/80 116/68 123/74 131/73  Pulse:  91 91 89  Resp:  15 16 15   Temp:  97.7 F (36.5 C) 97.9 F (36.6 C) 97.9 F (36.6 C)  TempSrc:  Oral    SpO2:  98% 97% 95%  Weight:      Height:       SpO2: 95 %   Intake/Output Summary (Last 24 hours) at 05/26/2024 0721 Last data filed at 05/25/2024 2200 Gross per 24 hour  Intake 595.03 ml  Output 1200 ml  Net -604.97 ml   Filed Weights   05/24/24 1147  Weight: 64.3 kg    Exam: General exam: In no acute distress. Respiratory system: Good air movement and clear to auscultation. Cardiovascular system: S1 & S2 heard, RRR. No JVD. Gastrointestinal system: Abdomen is nondistended, soft and nontender.  Extremities: No pedal edema. Skin: No rashes, lesions or ulcers Psychiatry: Judgement and insight appear normal. Mood & affect appropriate.  Data Reviewed:    Labs: Basic Metabolic Panel: Recent Labs  Lab 05/24/24 1837 05/25/24 0409 05/26/24 0349  NA 141 140 143  K 4.9 5.2* 4.8  CL 105 104 105  CO2 28 28 30   GLUCOSE 85 99 117*  BUN 33* 33* 31*  CREATININE 1.83* 1.83* 1.88*  CALCIUM  9.6 9.4 9.0  MG 2.4  --   --    GFR Estimated Creatinine Clearance: 20.8 mL/min (A) (by C-G formula based on SCr of 1.88 mg/dL (H)). Liver Function Tests: Recent Labs  Lab 05/24/24 1837 05/25/24 0409  AST 37  32  ALT 48* 43  ALKPHOS 84 82  BILITOT 0.4 0.4  PROT 6.6 6.3*  ALBUMIN  3.8 3.7   No results for input(s): LIPASE, AMYLASE in the last 168 hours. No results for input(s): AMMONIA in the last 168 hours. Coagulation profile No results for input(s): INR, PROTIME in the last 168 hours. COVID-19 Labs  No results for input(s): DDIMER, FERRITIN, LDH, CRP in the last 72 hours.  Lab Results  Component Value Date   SARSCOV2NAA NEGATIVE 08/03/2022   SARSCOV2NAA NEGATIVE 02/26/2020   SARSCOV2NAA RESULT:  NEGATIVE 09/10/2019   SARSCOV2NAA NOT DETECTED 04/20/2019    CBC: Recent Labs  Lab 05/24/24 1837 05/25/24 0409  WBC 7.8 7.3  NEUTROABS 5.8  --   HGB 12.3 11.6*  HCT 41.2 38.7  MCV 95.2 96.8  PLT 196 194   Cardiac Enzymes: Recent Labs  Lab 05/24/24 1837  CKTOTAL 37*   BNP (last 3 results) No results for input(s): PROBNP in the last 8760 hours. CBG: No results for input(s): GLUCAP in the last 168 hours. D-Dimer: No results for input(s): DDIMER in the last 72 hours. Hgb A1c: No results for input(s): HGBA1C in the last 72 hours. Lipid Profile: No results for  input(s): CHOL, HDL, LDLCALC, TRIG, CHOLHDL, LDLDIRECT in the last 72 hours. Thyroid  function studies: No results for input(s): TSH, T4TOTAL, T3FREE, THYROIDAB in the last 72 hours.  Invalid input(s): FREET3 Anemia work up: No results for input(s): VITAMINB12, FOLATE, FERRITIN, TIBC, IRON, RETICCTPCT in the last 72 hours. Sepsis Labs: Recent Labs  Lab 05/24/24 1837 05/25/24 0409  WBC 7.8 7.3   Microbiology No results found for this or any previous visit (from the past 240 hours).   Medications:    aspirin  EC  81 mg Oral Daily   atorvastatin   40 mg Oral Daily   DULoxetine   60 mg Oral q AM   And   DULoxetine   30 mg Oral QHS   famotidine   20 mg Oral Daily   gabapentin   200 mg Oral BID   heparin   5,000 Units Subcutaneous Q8H   polyethylene glycol   17 g Oral BID   sucralfate   1 g Oral QID   zolpidem   5 mg Oral QHS   Continuous Infusions:    LOS: 1 day   Sandra Craig  [facility]  05/26/2024, 7:21 AM

## 2024-05-26 NOTE — Plan of Care (Signed)
  Problem: Safety: Goal: Ability to remain free from injury will improve Outcome: Progressing   Problem: Pain Managment: Goal: General experience of comfort will improve and/or be controlled Outcome: Progressing   Problem: Elimination: Goal: Will not experience complications related to urinary retention Outcome: Progressing   Problem: Clinical Measurements: Goal: Ability to maintain clinical measurements within normal limits will improve Outcome: Progressing

## 2024-05-27 ENCOUNTER — Encounter: Payer: Self-pay | Admitting: Family Medicine

## 2024-05-27 DIAGNOSIS — S32502A Unspecified fracture of left pubis, initial encounter for closed fracture: Secondary | ICD-10-CM | POA: Diagnosis not present

## 2024-05-27 LAB — CBC
HCT: 34.9 % — ABNORMAL LOW (ref 36.0–46.0)
Hemoglobin: 10.6 g/dL — ABNORMAL LOW (ref 12.0–15.0)
MCH: 29.2 pg (ref 26.0–34.0)
MCHC: 30.4 g/dL (ref 30.0–36.0)
MCV: 96.1 fL (ref 80.0–100.0)
Platelets: 161 K/uL (ref 150–400)
RBC: 3.63 MIL/uL — ABNORMAL LOW (ref 3.87–5.11)
RDW: 14.1 % (ref 11.5–15.5)
WBC: 6.6 K/uL (ref 4.0–10.5)
nRBC: 0 % (ref 0.0–0.2)

## 2024-05-27 MED ORDER — OXYCODONE HCL 5 MG PO TABS: 5.0000 mg | ORAL_TABLET | Freq: Four times a day (QID) | ORAL | 0 refills | Status: AC | PRN

## 2024-05-27 MED ORDER — POLYETHYLENE GLYCOL 3350 17 G PO PACK: 17.0000 g | PACK | Freq: Every day | ORAL | 0 refills | Status: AC

## 2024-05-27 MED ORDER — ENOXAPARIN SODIUM 30 MG/0.3ML IJ SOSY: 30.0000 mg | PREFILLED_SYRINGE | INTRAMUSCULAR | Status: DC

## 2024-05-27 NOTE — Plan of Care (Signed)
  Problem: Clinical Measurements: Goal: Will remain free from infection Outcome: Progressing Goal: Respiratory complications will improve Outcome: Progressing Goal: Cardiovascular complication will be avoided Outcome: Progressing   Problem: Activity: Goal: Risk for activity intolerance will decrease Outcome: Progressing   Problem: Pain Managment: Goal: General experience of comfort will improve and/or be controlled Outcome: Progressing   Problem: Safety: Goal: Ability to remain free from injury will improve Outcome: Progressing   Problem: Skin Integrity: Goal: Risk for impaired skin integrity will decrease Outcome: Progressing

## 2024-05-27 NOTE — TOC Initial Note (Signed)
 Transition of Care [facility]) - Initial/Assessment Note    Patient Details  Name: Sandra Craig MRN: [id number] Date of Birth: 1940/11/12  [facility]) CM/SW Contact:    Alfonse JONELLE Rex, RN Phone Number: 05/27/2024, 4:17 PM  Clinical Narrative:   PT recommendation for short term rehab/SNF. NCM received call from patient's sister, Reena, introduced role of TOC/NCM and reviewed for dc planning, Reena agreeable to SNF, prefers [facility].  Reena reports patient resides with her spouse who is  currently undergoing chemotherapy treatment for cancer. Reena reports she resides in [facility] and is support family at this time. [facility] updated, faxed out for bed offers.                Expected Discharge Plan: Skilled Nursing Facility Barriers to Discharge: Continued Medical Work up   Patient Goals and CMS Choice Patient states their goals for this hospitalization and ongoing recovery are:: short term rehab prior to return home CMS Medicare.gov Compare Post Acute Care list provided to:: Patient Choice offered to / list presented to : Patient      Expected Discharge Plan and Services       Living arrangements for the past 2 months: Single Family Home Expected Discharge Date: 05/27/24                                    Prior Living Arrangements/Services Living arrangements for the past 2 months: Single Family Home Lives with:: Spouse Patient language and need for interpreter reviewed:: Yes        Need for Family Participation in Patient Care: Yes (Comment) Care giver support system in place?: Yes (comment)   Criminal Activity/Legal Involvement Pertinent to Current Situation/Hospitalization: No - Comment as needed  Activities of Daily Living   ADL Screening (condition at time of admission) Independently performs ADLs?: Yes (appropriate for developmental age) Is the patient deaf or have difficulty hearing?: No Does the patient have difficulty seeing, even when  wearing glasses/contacts?: No Does the patient have difficulty concentrating, remembering, or making decisions?: No  Permission Sought/Granted                  Emotional Assessment         Alcohol / Substance Use: Not Applicable Psych Involvement: No (comment)  Admission diagnosis:  Closed pelvic fracture (HCC) [S32.9XXA] Patient Active Problem List   Diagnosis Date Noted   Closed pelvic fracture (HCC) 05/24/2024   Major depressive disorder, recurrent, in full remission 11/30/2022   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   Chest pain 07/03/2022   Memory loss 07/21/2020   Facet arthropathy 09/14/2019   Vaginal dryness 01/29/2019   UTI (urinary tract infection) 12/15/2018   History of transient ischemic attack (TIA) 06/02/2017   Chronic kidney disease, stage 3b (HCC) 06/02/2017   Near syncope 03/11/2017   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   Chronic right shoulder pain 03/07/2014   Chronic low back pain 12/20/2013   Benign essential tremor 10/09/2013   Hyperglycemia 06/27/2012   Diverticulitis large intestine 02/11/2012   Irritable bowel syndrome (IBS) 06/28/2011   History of small bowel obstruction 06/28/2011   GERD with stricture 06/28/2011   Hypertension    Hyperlipidemia    IC (interstitial cystitis)    INSOMNIA, CHRONIC 10/08/2008   Hemorrhoids 10/08/2008   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 AT [address] [place] [address] Phone: [phone] Fax: [phone]  [facility] [phone] [facility], [place] - [phone] [address] [facility] [phone] [place]  [address] [place] [address] Phone: [phone] Fax:  [phone]     Social Drivers of Health (SDOH) Social History: SDOH Screenings   Food Insecurity: No Food Insecurity (05/24/2024)  Housing: Low Risk  (05/24/2024)  Transportation Needs: No Transportation Needs (05/24/2024)  Utilities: Not At Risk (05/24/2024)  Alcohol Screen: Low Risk  (01/10/2024)  Depression (PHQ2-9): Low Risk  (05/15/2024)  Financial Resource Strain: Low Risk  (05/14/2024)  Physical Activity: Unknown (05/14/2024)  Social Connections: Socially Integrated (05/24/2024)  Stress: Stress Concern Present (05/14/2024)  Tobacco Use: Medium Risk (05/24/2024)  Health Literacy: Adequate Health Literacy (01/10/2024)   SDOH Interventions:     Readmission Risk Interventions    05/27/2024    4:16 PM  Readmission Risk Prevention Plan  Post Dischage Appt Complete  Medication Screening Complete  Transportation Screening Complete

## 2024-05-27 NOTE — TOC PASRR Note (Signed)
 30 Day PASRR Note   Patient Details  Name: Sandra Craig Date of Birth: March 18, 1941   Transition of Care [facility]) CM/SW Contact:    Alfonse JONELLE Rex, RN Phone Number: 05/27/2024, 4:34 PM  To Whom It May Concern:  Please be advised that this patient will require a short-term nursing home stay - anticipated 30 days or less for rehabilitation and strengthening.   The plan is for return home.

## 2024-05-27 NOTE — Progress Notes (Signed)
 Physical Therapy Treatment Patient Details Name: Sandra Craig MRN: [id number] DOB: 1941/06/07 Today's Date: 05/27/2024   History of Present Illness 83 y.o. female who had a ground-level fall and noted immediate onset of left hip pain.  Was unable to weight-bear.  She presented to the ER where she was found to have a pelvic ring injury, left upper and inferior pubic ramus fracture with involvement of the acetabulum medial wall. admitted to the medicine service and orthopedics consulted.  PMH: HTN, HLD, R TSA 2013, , R hemiarthroplsasty in 2008, fibromyalgia, CKD    PT Comments  Pt making steady progress. Amb 58' with RW and min assist. Activity limited by pain however pt put forth good effort throughout session, requiring decr assist for transfers and ambulation. Anticipate continue progress. Discussed leaving purewick out during the day with pt and NT. D/c plan remains appropriate at this time.    If plan is discharge home, recommend the following: A lot of help with walking and/or transfers;Assist for transportation;Help with stairs or ramp for entrance;Assistance with cooking/housework   Can travel by private vehicle     No  Equipment Recommendations  None recommended by PT    Recommendations for Other Services       Precautions / Restrictions Precautions Precautions: Fall Restrictions LLE Weight Bearing Per Provider Order: Weight bearing as tolerated     Mobility  Bed Mobility Overal bed mobility: Needs Assistance Bed Mobility: Supine to Sit           General bed mobility comments: assist to elevate trunk and progress LEs off bed, incr time needed    Transfers Overall transfer level: Needs assistance Equipment used: Rolling walker (2 wheels) Transfers: Sit to/from Stand Sit to Stand: Min assist           General transfer comment: verbal cues for proper ahnd placement and to power up to stand, plce more wt on RLE for pain control     Ambulation/Gait Ambulation/Gait assistance: Min assist Gait Distance (Feet): 12 Feet Assistive device: Rolling walker (2 wheels) Gait Pattern/deviations: Step-to pattern, Antalgic, Decreased step length - right, Decreased step length - left       General Gait Details: incr time needed, assist to advance LLE d/t pain. cues for RW position, sequence, use of UEs to off load LLE for pain control   Stairs             Wheelchair Mobility     Tilt Bed    Modified Rankin (Stroke Patients Only)       Balance   Sitting-balance support: Feet supported, Single extremity supported Sitting balance-Leahy Scale: Fair Sitting balance - Comments: not challenged d/t pain   Standing balance support: Reliant on assistive device for balance, During functional activity Standing balance-Leahy Scale: Poor Standing balance comment: heavy reliance on UEs d/t pain                            Communication Communication Communication: No apparent difficulties  Cognition Arousal: Alert Behavior During Therapy: WFL for tasks assessed/performed   PT - Cognitive impairments: No apparent impairments                         Following commands: Intact      Cueing Cueing Techniques: Verbal cues  Exercises General Exercises - Lower Extremity Ankle Circles/Pumps: AROM, 5 reps, Both    General Comments  Pertinent Vitals/Pain Pain Assessment Pain Assessment: Faces Faces Pain Scale: Hurts whole lot Pain Location: pelvic area, L>RLE Pain Descriptors / Indicators: Grimacing, Guarding Pain Intervention(s): Limited activity within patient's tolerance, Monitored during session, Premedicated before session, Repositioned    Home Living                          Prior Function            PT Goals (current goals can now be found in the care plan section) Acute Rehab PT Goals Patient Stated Goal: back to independence PT Goal Formulation: With  patient Time For Goal Achievement: 06/08/24 Potential to Achieve Goals: Good Progress towards PT goals: Progressing toward goals    Frequency    Min 4X/week      PT Plan      Co-evaluation              AM-PAC PT 6 Clicks Mobility   Outcome Measure  Help needed turning from your back to your side while in a flat bed without using bedrails?: A Lot Help needed moving from lying on your back to sitting on the side of a flat bed without using bedrails?: A Lot Help needed moving to and from a bed to a chair (including a wheelchair)?: A Lot Help needed standing up from a chair using your arms (e.g., wheelchair or bedside chair)?: A Lot Help needed to walk in hospital room?: A Lot   6 Click Score: 10    End of Session Equipment Utilized During Treatment: Gait belt Activity Tolerance: Patient tolerated treatment well Patient left: in chair;with call bell/phone within reach;with chair alarm set Nurse Communication: Mobility status PT Visit Diagnosis: Other abnormalities of gait and mobility (R26.89)     Time: 8875-8855 PT Time Calculation (min) (ACUTE ONLY): 20 min  Charges:    $Gait Training: 8-22 mins PT General Charges $$ ACUTE PT VISIT: 1 Visit                     Rexene, PT  Acute Rehab Dept [facility]) [phone]  05/27/2024    [facility] 05/27/2024, 12:48 PM

## 2024-05-27 NOTE — Discharge Summary (Signed)
 Physician Discharge Summary  Sandra Craig FMW:[id number] DOB: May 24, 1941 DOA: 05/24/2024  PCP: Katrinka Garnette KIDD, MD  Admit date: 05/24/2024 Discharge date: 05/27/2024  Admitted From: Home Disposition:  SNF  Recommendations for Outpatient Follow-up:  Follow up with PCP in 1-2 weeks Please obtain BMP/CBC in one week   Home Health:No Equipment/Devices:None  Discharge Condition:Stable CODE STATUS:FUll Diet recommendation: Heart Healthy  Brief/Interim Summary: 83 y.o. 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   Discharge Diagnoses:  Principal Problem:   Closed pelvic fracture (HCC) Active Problems:   Depression   Hypertension   Chronic kidney disease, stage 3b (HCC)  Closed pelvic fracture secondary to mechanical fall, CT scan showed nondisplaced superior and inferior pubic ramus fracture and tending to the medial wall of the acetabulum. Orthopedic surgery was consulted recommended conservative management. Her pain was controlled with narcotics and Tylenol . Physical therapy evaluated the patient she will need skilled nursing facility temporarily.  Mild hypokalemia: Now resolved with IV fluids.  Chronic kidney disease stage IIIb: Creatinine at baseline.  Acute blood loss anemia, Minimal drop of hemoglobin, has remained relatively stable after this.  Essential hypertension: No changes made to her medication.  Depression/ anxiety/insomnia: Continue current home regimen no changes made  Discharge Instructions  Discharge Instructions     Diet - low sodium heart healthy   Complete by: As directed    Increase activity slowly   Complete by:  As directed       Allergies as of 05/27/2024       Reactions   Zithromax  [azithromycin ] Rash   Pruritic rash diffuse    Celebrex [celecoxib] Nausea Only        Medication List     TAKE these medications    acetaminophen  500 MG tablet Commonly known as: TYLENOL  Take 1,000 mg by mouth 2 (two) times daily as needed for headache (pain).   aspirin  81 MG tablet Take 1 tablet (81 mg total) by mouth daily.   atorvastatin  40 MG tablet Commonly known as: LIPITOR TAKE 1 TABLET(40 MG) BY MOUTH DAILY What changed: See the new instructions.   CALCIUM  + VITAMIN D3 PO Take 1 tablet by mouth 2 (two) times daily.   DULoxetine  30 MG capsule Commonly known as: CYMBALTA  TAKE 2 CAPSULE BY MOUTH EVERY MORNING AND 1 CAPSULE BY MOUTH EVERY EVENING What changed:  how much to take how to take this when to take this additional instructions   enoxaparin  30 MG/0.3ML injection Commonly known as: LOVENOX  Inject 0.3 mLs (30 mg total) into the skin daily for 10 days.   famotidine  20 MG tablet Commonly known as: PEPCID  TAKE 1 TABLET(20 MG) BY MOUTH TWICE DAILY   gabapentin  300 MG capsule Commonly known as: NEURONTIN  TAKE 1 CAPSULE(300 MG) BY MOUTH TWICE DAILY AS NEEDED FOR PAIN   losartan  25 MG tablet Commonly known as: COZAAR  Take 12.5 mg by mouth daily.   Multivitamin Women 50+ Tabs Take 1 tablet by mouth 2 (two) times daily.   oxyCODONE  5 MG immediate release tablet Commonly known as: Oxy IR/ROXICODONE  Take 1 tablet (5 mg total) by mouth every 6 (six) hours as needed for up to 3 days for moderate  pain (pain score 4-6).   polyethylene glycol 17 g packet Commonly known as: MIRALAX  / GLYCOLAX  Take 17 g by mouth daily for 5 days.   sucralfate  1 g tablet Commonly known as: Carafate  Take 1 tablet (1 g total) by mouth 4 (four) times daily. Dissolve each tablet in 15 cc water  before use. What changed:  when to take this reasons to take this additional instructions   SYSTANE  OP Place 1 drop into both eyes 2 (two) times daily as needed (eye irritation).   zolpidem  5 MG tablet Commonly known as: AMBIEN  TAKE 1 TABLET BY MOUTH EVERY DAY AT BEDTIME FOR SLEEP        Allergies  Allergen Reactions   Zithromax  [Azithromycin ] Rash    Pruritic rash diffuse     Celebrex [Celecoxib] Nausea Only    Consultations: orthopedic surgery   Procedures/Studies: CT Hip Left Wo Contrast Result Date: 05/24/2024 CLINICAL DATA:  Hip trauma, fracture suspected, xray done Left hip pain after falling. EXAM: CT OF THE LEFT HIP WITHOUT CONTRAST TECHNIQUE: Multidetector CT imaging of the left hip was performed according to the standard protocol. Multiplanar CT image reconstructions were also generated. RADIATION DOSE REDUCTION: This exam was performed according to the departmental dose-optimization program which includes automated exposure control, adjustment of the mA and/or kV according to patient size and/or use of iterative reconstruction technique. COMPARISON:  Radiographs 05/24/2024.  Left hip MRI 12/19/2023 FINDINGS: Bones/Joint/Cartilage There are nondisplaced fractures of the left superior and inferior pubic rami. The fracture of the left superior pubic ramus extends into the medial wall of the left acetabulum. The left femoral head is located and intact. No evidence of proximal femur fracture or osteonecrosis. There are mild left hip degenerative changes without significant joint effusion. The left sacroiliac joint and symphysis pubis appear intact. Ligaments Suboptimally assessed by CT. Muscles and Tendons Unremarkable. Soft tissues No evidence of significant pelvic hematoma. There are postsurgical changes consistent with previous left inguinal herniorrhaphy. Diverticular changes in the sigmoid colon without evidence of acute inflammation. Mild iliofemoral atherosclerosis. IMPRESSION: 1. Nondisplaced fractures of the left superior and inferior pubic rami. The fracture of the left  superior pubic ramus extends into the medial wall of the left acetabulum. 2. No evidence of proximal femur fracture, dislocation or osteonecrosis. 3. Mild left hip degenerative changes. Electronically Signed   By: Elsie Perone M.D.   On: 05/24/2024 14:47   CT Cervical Spine Wo Contrast Result Date: 05/24/2024 EXAM: CT CERVICAL SPINE WITHOUT CONTRAST 05/24/2024 02:33:36 PM TECHNIQUE: CT of the cervical spine was performed without the administration of intravenous contrast. Multiplanar reformatted images are provided for review. Automated exposure control, iterative reconstruction, and/or weight based adjustment of the mA/kV was utilized to reduce the radiation dose to as low as reasonably achievable. COMPARISON: None available. CLINICAL HISTORY: Neck trauma (Age >= 65y). Table formatting from the original note was not included.; BIB EMS due to fall and left hip pain, sharp pain when trying to stand and radiates No noted shortening or rotation, she got tangled in dog leash fell back and hit head on cabinet. 150/100-90-95% RA FINDINGS: CERVICAL SPINE: BONES AND ALIGNMENT: Straightening of the normal cervical lordosis is present. Slight degenerative anterolisthesis is present at C3-C4 and C4-C5. No acute fracture or traumatic malalignment. DEGENERATIVE CHANGES: Slight degenerative anterolisthesis is present at C3-C4 and C4-C5. Chronic endplate changes are present at C3-C4, C5-C6 and C6-C7. SOFT TISSUES: No prevertebral soft tissue swelling. LUNGS: Medial right upper lobe airspace disease and bronchiectasis presumably  secondary to prior radiation, is stable. IMPRESSION: 1. No acute abnormality of the cervical spine related to neck trauma. 2. Straightening of the normal cervical lordosis. 3. Slight degenerative anterolisthesis at C3-4 and C4-5. 4. Chronic endplate changes at C3-4, C5-6, and C6-7. Electronically signed by: Lonni Necessary MD 05/24/2024 02:44 PM EDT RP Workstation: [id number]   CT Head Wo  Contrast Result Date: 05/24/2024 EXAM: CT HEAD WITHOUT CONTRAST 05/24/2024 02:33:36 PM TECHNIQUE: CT of the head was performed without the administration of intravenous contrast. Automated exposure control, iterative reconstruction, and/or weight based adjustment of the mA/kV was utilized to reduce the radiation dose to as low as reasonably achievable. COMPARISON: None available. CLINICAL HISTORY: BIB EMS due to fall and left hip pain, sharp pain when trying to stand and radiates No noted shortening or rotation, she got tangled in dog leash fell back and hit head on cabinet. 150/100-90-95% RA FINDINGS: BRAIN AND VENTRICLES: No acute hemorrhage. No evidence of acute infarct. No hydrocephalus. No extra-axial collection. No mass effect or midline shift. Age-related cerebral volume loss and mild-to-moderate periventricular and deep cerebral white matter disease. 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 Mild calcific atheromatous disease. ORBITS: No acute abnormality. Status post bilateral lens replacement. SINUSES: No acute abnormality. SOFT TISSUES AND SKULL: No acute soft tissue abnormality. No skull fracture. IMPRESSION: 1. No acute intracranial abnormality related to the reported fall and head trauma. 2. Age-related cerebral volume loss and mild-to-moderate periventricular and deep cerebral white matter disease. 3. 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 Electronically signed by: Lonni Necessary MD 05/24/2024 02:39 PM EDT RP Workstation: [id number]   DG Hip Unilat With Pelvis 2-3 Views Left Result Date: 05/24/2024 CLINICAL DATA:  Fall, left hip and groin pain EXAM: DG HIP (WITH OR WITHOUT PELVIS) 2-3V LEFT COMPARISON:  None Available. FINDINGS: Prior right hip replacement. Fractures in the left superior and inferior pubic rami. The superior pubic ramus fracture courses toward the left acetabulum and possibly involves the medial wall. No subluxation or dislocation. No femoral  neck fracture. IMPRESSION: Left superior and inferior pubic rami fractures. Possible involvement of the medial acetabulum. Electronically Signed   By: Franky Crease M.D.   On: 05/24/2024 12:38   CT Chest Wo Contrast Result Date: 05/15/2024 EXAM: CT CHEST WITHOUT CONTRAST 05/15/2024 03:21:48 PM TECHNIQUE: CT of the chest was performed without the administration of intravenous contrast. Multiplanar reformatted images are provided for review. Automated exposure control, iterative reconstruction, and/or weight based adjustment of the mA/kV was utilized to reduce the radiation dose to as low as reasonably achievable. COMPARISON: CT chest 01/01/2024. CLINICAL HISTORY: Lung nodule, < 6 mm, high cancer risk. Follow-up nodule planned 6 months, moved up to 4 months due to worsening productive cough. 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 FINDINGS: MEDIASTINUM: Heart: Coronary artery and aortic atherosclerotic calcification. No pericardial effusion. Pericardium: No pericardial effusion. The central airways are clear. Similar postsurgical changes from anterior mediastinal mass resection. LYMPH NODES: Evaluation for mediastinal and hilar lymphadenopathy is limited without IV contrast. No definite new or enlarging thoracic lymph nodes. LUNGS AND PLEURA: Bronchiectasis in the right upper lobe and medial left upper lobe changes are presumed due to radiation changes. These are similar to prior. Centrilobular micronodules and ground-glass nodules as well as tree-in-bud nodularity has increased compared to 01/01/2024, this is greatest in the left upper lobe where there is confluent opacification on series 8 image  48 measuring 1.4 x 1.3 cm. The previously measured nodule in the left upper lobe measuring 9 x 4 mm is not substantially changed (series 8 image 51). The previously new nodule in the peripheral right lower lobe  has increased in size now measuring 5 mm (previously 4 mm) with increasing adjacent ground glass opacity. No pleural effusion or pneumothorax. SOFT TISSUES/BONES: Right TSA. No acute fracture or destructive osseous lesion. UPPER ABDOMEN: Limited images of the upper abdomen demonstrates no acute abnormality. IMPRESSION: 1. Worsening mycobacterial infection greatest in the left upper lobe where there is more confluent nodularity. Continued attention on follow up. 2. Slight increase in size and associated surrounding ground glass opacity about a 5 mm right lower lobe nodule. This may be infectious/inflammatory; however continued follow-up imaging is recommended. 3. 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 Electronically signed by: Norman Gatlin MD 05/15/2024 03:45 PM EDT RP Workstation: [id number]   (Echo, Carotid, EGD, Colonoscopy, ERCP)    Subjective: No complaints  Discharge Exam: Vitals:   05/26/24 2100 05/27/24 0541  BP: (!) 112/56 110/67  Pulse: 98 93  Resp: 18 18  Temp: (!) 97.4 F (36.3 C) 98.1 F (36.7 C)  SpO2: 97% 100%   Vitals:   05/26/24 0609 05/26/24 0832 05/26/24 2100 05/27/24 0541  BP: 131/73 133/76 (!) 112/56 110/67  Pulse: 89 91 98 93  Resp: 15 16 18 18   Temp: 97.9 F (36.6 C) 98 F (36.7 C) (!) 97.4 F (36.3 C) 98.1 F (36.7 C)  TempSrc:  Oral Oral Oral  SpO2: 95% 96% 97% 100%  Weight:      Height:        General: Pt is alert, awake, not in acute distress Cardiovascular: RRR, S1/S2 +, no rubs, no gallops Respiratory: CTA bilaterally, no wheezing, no rhonchi Abdominal: Soft, NT, ND, bowel sounds + Extremities: no edema, no cyanosis    The results of significant diagnostics from this hospitalization (including imaging, microbiology, ancillary and laboratory) are listed below for reference.     Microbiology: No results found for this or any previous visit (from the past 240 hours).    Labs: BNP (last 3 results) No results for input(s): BNP in the last 8760 hours. Basic Metabolic Panel: Recent Labs  Lab 05/24/24 1837 05/25/24 0409 05/26/24 0349  NA 141 140 143  K 4.9 5.2* 4.8  CL 105 104 105  CO2 28 28 30   GLUCOSE 85 99 117*  BUN 33* 33* 31*  CREATININE 1.83* 1.83* 1.88*  CALCIUM  9.6 9.4 9.0  MG 2.4  --   --    Liver Function Tests: Recent Labs  Lab 05/24/24 1837 05/25/24 0409  AST 37 32  ALT 48* 43  ALKPHOS 84 82  BILITOT 0.4 0.4  PROT 6.6 6.3*  ALBUMIN  3.8 3.7   No results for input(s): LIPASE, AMYLASE in the last 168 hours. No results for input(s): AMMONIA in the last 168 hours. CBC: Recent Labs  Lab 05/24/24 1837 05/25/24 0409 05/26/24 0744 05/27/24 0317  WBC 7.8 7.3 6.4 6.6  NEUTROABS 5.8  --   --   --   HGB 12.3 11.6* 10.8* 10.6*  HCT 41.2 38.7 36.8 34.9*  MCV 95.2 96.8 97.1 96.1  PLT 196 194 170 161   Cardiac Enzymes: Recent Labs  Lab 05/24/24 1837  CKTOTAL 37*   BNP: Invalid input(s): POCBNP CBG: No results for input(s): GLUCAP in the last 168 hours. D-Dimer No results for input(s): DDIMER  in the last 72 hours. Hgb A1c No results for input(s): HGBA1C in the last 72 hours. Lipid Profile No results for input(s): CHOL, HDL, LDLCALC, TRIG, CHOLHDL, LDLDIRECT in the last 72 hours. Thyroid  function studies No results for input(s): TSH, T4TOTAL, T3FREE, THYROIDAB in the last 72 hours.  Invalid input(s): FREET3 Anemia work up No results for input(s): VITAMINB12, FOLATE, FERRITIN, TIBC, IRON, RETICCTPCT in the last 72 hours. Urinalysis    Component Value Date/Time   COLORURINE YELLOW 08/03/2022 1005   APPEARANCEUR CLEAR 08/03/2022 1005   LABSPEC 1.014 08/03/2022 1005   PHURINE 6.0 08/03/2022 1005   GLUCOSEU NEGATIVE 08/03/2022 1005   GLUCOSEU NEGATIVE 03/10/2017 1418   HGBUR NEGATIVE 08/03/2022 1005   HGBUR moderate 07/02/2008 1513   BILIRUBINUR NEGATIVE 08/03/2022  1005   BILIRUBINUR neg 11/02/2021 1144   KETONESUR NEGATIVE 08/03/2022 1005   PROTEINUR NEGATIVE 08/03/2022 1005   UROBILINOGEN 0.2 11/02/2021 1144   UROBILINOGEN 0.2 03/10/2017 1418   NITRITE NEGATIVE 08/03/2022 1005   LEUKOCYTESUR TRACE (A) 08/03/2022 1005   Sepsis Labs Recent Labs  Lab 05/24/24 1837 05/25/24 0409 05/26/24 0744 05/27/24 0317  WBC 7.8 7.3 6.4 6.6   Microbiology No results found for this or any previous visit (from the past 240 hours).   Time coordinating discharge: Over 30 minutes  SIGNED:   Erle Odell Castor, MD  [facility] 05/27/2024, 8:21 AM Pager   If 7PM-7AM, please contact night-coverage [email or web address] Password [email or web address]

## 2024-05-27 NOTE — NC FL2 (Signed)
 [place]  MEDICAID [id number] LEVEL OF CARE FORM     IDENTIFICATION  Patient Name: Sandra Craig Birthdate: 1940/09/27 Sex: female Admission Date (Current Location): 05/24/2024  [facility] and [place] Number:  Producer, Television/film/video and Address:  [facility],  [address] [place], [place] [address]      Provider Number: [id number]  Attending Physician Name and Address:  Odell Celinda Balo, MD  Relative Name and Phone Number:  Reena Fischer (sister) [phone]    Current Level of Care: Hospital Recommended Level of Care: Skilled Nursing Facility Prior Approval Number:    Date Approved/Denied:   PASRR Number: pending  Discharge Plan: SNF    Current Diagnoses: Patient Active Problem List   Diagnosis Date Noted   Closed pelvic fracture (HCC) 05/24/2024   Major depressive disorder, recurrent, in full remission 11/30/2022   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   Chest pain 07/03/2022   Memory loss 07/21/2020   Facet arthropathy 09/14/2019   Vaginal dryness 01/29/2019   UTI (urinary tract infection) 12/15/2018   History of transient ischemic attack (TIA) 06/02/2017   Chronic kidney disease, stage 3b (HCC) 06/02/2017   Near syncope 03/11/2017   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   Chronic right shoulder pain 03/07/2014   Chronic low back pain 12/20/2013   Benign essential tremor 10/09/2013   Hyperglycemia 06/27/2012   Diverticulitis large intestine 02/11/2012   Irritable bowel syndrome (IBS) 06/28/2011   History of small bowel obstruction 06/28/2011   GERD with stricture 06/28/2011   Hypertension    Hyperlipidemia    IC (interstitial cystitis)    INSOMNIA, CHRONIC 10/08/2008   Hemorrhoids 10/08/2008   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   HEMATURIA UNSPECIFIED 07/04/2008   Depression 02/08/2008   Fibromyalgia 12/05/2007   Lung nodules 08/09/2007    Orientation RESPIRATION BLADDER Height &  Weight     Self, Time, Situation, Place  Normal Continent Weight: 64.3 kg Height:  5' 5 (165.1 cm)  BEHAVIORAL SYMPTOMS/MOOD NEUROLOGICAL BOWEL NUTRITION STATUS      Continent Diet (regular diet)  AMBULATORY STATUS COMMUNICATION OF NEEDS Skin   Limited Assist Verbally Normal                       Personal Care Assistance Level of Assistance  Bathing, Feeding, Dressing Bathing Assistance: Limited assistance Feeding assistance: Limited assistance       Functional Limitations Info  Sight, Hearing, Speech Sight Info: Impaired (eyeglasses) Hearing Info: Adequate Speech Info: Adequate    SPECIAL CARE FACTORS FREQUENCY  PT (By licensed PT), OT (By licensed OT)     PT Frequency: 5x/wk OT Frequency: 5x/wk            Contractures Contractures Info: Not present    Additional Factors Info  Code Status, Allergies, Psychotropic Code Status Info: Full Code Allergies Info: Zithromax  (Azithromycin ), Celebrex (Celecoxib) Psychotropic Info: Cymbalta  60mg  po daily; Cymbalta  30mg  po daily         Current Medications (05/27/2024):  This is the current hospital active medication list Current Facility-Administered Medications  Medication Dose Route Frequency Provider Last Rate Last Admin   acetaminophen  (TYLENOL ) tablet 650 mg  650 mg Oral Q6H PRN Gonfa, Taye T, MD   650 mg at 05/26/24 0443   Or   acetaminophen  (TYLENOL ) suppository 650 mg  650 mg Rectal Q6H PRN Gonfa, Taye T, MD       aspirin  EC tablet 81 mg  81 mg  Oral Daily Odell Celinda Balo, MD   81 mg at 05/27/24 9096   atorvastatin  (LIPITOR) tablet 40 mg  40 mg Oral Daily Odell Celinda Balo, MD   40 mg at 05/27/24 9095   DULoxetine  (CYMBALTA ) DR capsule 60 mg  60 mg Oral q AM Gonfa, Taye T, MD   60 mg at 05/27/24 0901   And   DULoxetine  (CYMBALTA ) DR capsule 30 mg  30 mg Oral QHS Gonfa, Taye T, MD   30 mg at 05/26/24 2310   enoxaparin  (LOVENOX ) injection 30 mg  30 mg Subcutaneous Q24H Nicholaus Quarry, RPH   30 mg at  05/27/24 1445   famotidine  (PEPCID ) tablet 20 mg  20 mg Oral Daily Gonfa, Taye T, MD   20 mg at 05/27/24 0901   gabapentin  (NEURONTIN ) capsule 200 mg  200 mg Oral BID Gonfa, Taye T, MD   200 mg at 05/27/24 0901   HYDROmorphone  (DILAUDID ) injection 0.5 mg  0.5 mg Intravenous Q4H PRN Gonfa, Taye T, MD   0.5 mg at 05/26/24 2312   oxyCODONE  (Oxy IR/ROXICODONE ) immediate release tablet 5 mg  5 mg Oral Q6H PRN Gonfa, Taye T, MD   5 mg at 05/27/24 1443   sucralfate  (CARAFATE ) tablet 1 g  1 g Oral QID Odell Celinda Balo, MD   1 g at 05/27/24 1443   zolpidem  (AMBIEN ) tablet 5 mg  5 mg Oral QHS Gonfa, Taye T, MD   5 mg at 05/26/24 2310     Discharge Medications: Please see discharge summary for a list of discharge medications.  Relevant Imaging Results:  Relevant Lab Results:   Additional Information SSN: [id number]  Alfonse JONELLE Rex, RN

## 2024-05-28 DIAGNOSIS — S32502A Unspecified fracture of left pubis, initial encounter for closed fracture: Secondary | ICD-10-CM | POA: Diagnosis not present

## 2024-05-28 MED ORDER — POLYETHYLENE GLYCOL 3350 17 G PO PACK
17.0000 g | PACK | Freq: Two times a day (BID) | ORAL | Status: DC
  Administered 2024-05-28 – 2024-05-29 (×3): 17 g via ORAL
  Filled 2024-05-28 (×3): qty 1

## 2024-05-28 NOTE — Discharge Summary (Signed)
 Physician Discharge Summary  Sandra Craig FMW:[id number] DOB: October 31, 1940 DOA: 05/24/2024  PCP: Katrinka Garnette KIDD, MD  Admit date: 05/24/2024 Discharge date: 05/29/2024  Admitted From: Home Disposition:  SNF  Recommendations for Outpatient Follow-up:  Follow up with PCP in 1-2 weeks Please obtain BMP/CBC in one week   Home Health:No Equipment/Devices:None  Discharge Condition:Stable CODE STATUS:FUll Diet recommendation: Heart Healthy  Brief/Interim Summary: 83 y.o. 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   Discharge Diagnoses:  Principal Problem:   Closed pelvic fracture (HCC) Active Problems:   Depression   Hypertension   Chronic kidney disease, stage 3b (HCC)  Closed pelvic fracture secondary to mechanical fall: CT scan showed nondisplaced superior and inferior pubic ramus fracture and tending to the medial wall of the acetabulum. Orthopedic surgery was consulted recommended conservative management. Her pain was controlled with narcotics and Tylenol . Physical therapy evaluated the patient she will need skilled nursing facility temporarily.  Mild hypokalemia: Now resolved with IV fluids.  Chronic kidney disease stage IIIb: Creatinine at baseline.  Acute blood loss anemia: Minimal drop of hemoglobin, has remained relatively stable after this.  Essential hypertension: No changes made to her medication.  Depression/ anxiety/insomnia: Continue current home regimen no changes made  Discharge Instructions  Discharge Instructions     Diet - low sodium heart healthy   Complete by: As directed    Increase activity slowly   Complete by:  As directed       Allergies as of 05/28/2024       Reactions   Zithromax  [azithromycin ] Rash   Pruritic rash diffuse    Celebrex [celecoxib] Nausea Only        Medication List     TAKE these medications    acetaminophen  500 MG tablet Commonly known as: TYLENOL  Take 1,000 mg by mouth 2 (two) times daily as needed for headache (pain).   aspirin  81 MG tablet Take 1 tablet (81 mg total) by mouth daily.   atorvastatin  40 MG tablet Commonly known as: LIPITOR TAKE 1 TABLET(40 MG) BY MOUTH DAILY What changed: See the new instructions.   CALCIUM  + VITAMIN D3 PO Take 1 tablet by mouth 2 (two) times daily.   DULoxetine  30 MG capsule Commonly known as: CYMBALTA  TAKE 2 CAPSULE BY MOUTH EVERY MORNING AND 1 CAPSULE BY MOUTH EVERY EVENING What changed:  how much to take how to take this when to take this additional instructions   enoxaparin  30 MG/0.3ML injection Commonly known as: LOVENOX  Inject 0.3 mLs (30 mg total) into the skin daily for 10 days.   famotidine  20 MG tablet Commonly known as: PEPCID  TAKE 1 TABLET(20 MG) BY MOUTH TWICE DAILY   gabapentin  300 MG capsule Commonly known as: NEURONTIN  TAKE 1 CAPSULE(300 MG) BY MOUTH TWICE DAILY AS NEEDED FOR PAIN   losartan  25 MG tablet Commonly known as: COZAAR  Take 12.5 mg by mouth daily.   Multivitamin Women 50+ Tabs Take 1 tablet by mouth 2 (two) times daily.   oxyCODONE  5 MG immediate release tablet Commonly known as: Oxy IR/ROXICODONE  Take 1 tablet (5 mg total) by mouth every 6 (six) hours as needed for up to 3 days for moderate  pain (pain score 4-6).   polyethylene glycol 17 g packet Commonly known as: MIRALAX  / GLYCOLAX  Take 17 g by mouth daily for 5 days.   sucralfate  1 g tablet Commonly known as: Carafate  Take 1 tablet (1 g total) by mouth 4 (four) times daily. Dissolve each tablet in 15 cc water  before use. What changed:  when to take this reasons to take this additional instructions   SYSTANE  OP Place 1 drop into both eyes 2 (two) times daily as needed (eye irritation).   zolpidem  5 MG tablet Commonly known as: AMBIEN  TAKE 1 TABLET BY MOUTH EVERY DAY AT BEDTIME FOR SLEEP        Allergies  Allergen Reactions   Zithromax  [Azithromycin ] Rash    Pruritic rash diffuse     Celebrex [Celecoxib] Nausea Only    Consultations: orthopedic surgery   Procedures/Studies: CT Hip Left Wo Contrast Result Date: 05/24/2024 CLINICAL DATA:  Hip trauma, fracture suspected, xray done Left hip pain after falling. EXAM: CT OF THE LEFT HIP WITHOUT CONTRAST TECHNIQUE: Multidetector CT imaging of the left hip was performed according to the standard protocol. Multiplanar CT image reconstructions were also generated. RADIATION DOSE REDUCTION: This exam was performed according to the departmental dose-optimization program which includes automated exposure control, adjustment of the mA and/or kV according to patient size and/or use of iterative reconstruction technique. COMPARISON:  Radiographs 05/24/2024.  Left hip MRI 12/19/2023 FINDINGS: Bones/Joint/Cartilage There are nondisplaced fractures of the left superior and inferior pubic rami. The fracture of the left superior pubic ramus extends into the medial wall of the left acetabulum. The left femoral head is located and intact. No evidence of proximal femur fracture or osteonecrosis. There are mild left hip degenerative changes without significant joint effusion. The left sacroiliac joint and symphysis pubis appear intact. Ligaments Suboptimally assessed by CT. Muscles and Tendons Unremarkable. Soft tissues No evidence of significant pelvic hematoma. There are postsurgical changes consistent with previous left inguinal herniorrhaphy. Diverticular changes in the sigmoid colon without evidence of acute inflammation. Mild iliofemoral atherosclerosis. IMPRESSION: 1. Nondisplaced fractures of the left superior and inferior pubic rami. The fracture of the left  superior pubic ramus extends into the medial wall of the left acetabulum. 2. No evidence of proximal femur fracture, dislocation or osteonecrosis. 3. Mild left hip degenerative changes. Electronically Signed   By: Elsie Perone M.D.   On: 05/24/2024 14:47   CT Cervical Spine Wo Contrast Result Date: 05/24/2024 EXAM: CT CERVICAL SPINE WITHOUT CONTRAST 05/24/2024 02:33:36 PM TECHNIQUE: CT of the cervical spine was performed without the administration of intravenous contrast. Multiplanar reformatted images are provided for review. Automated exposure control, iterative reconstruction, and/or weight based adjustment of the mA/kV was utilized to reduce the radiation dose to as low as reasonably achievable. COMPARISON: None available. CLINICAL HISTORY: Neck trauma (Age >= 65y). Table formatting from the original note was not included.; BIB EMS due to fall and left hip pain, sharp pain when trying to stand and radiates No noted shortening or rotation, she got tangled in dog leash fell back and hit head on cabinet. 150/100-90-95% RA FINDINGS: CERVICAL SPINE: BONES AND ALIGNMENT: Straightening of the normal cervical lordosis is present. Slight degenerative anterolisthesis is present at C3-C4 and C4-C5. No acute fracture or traumatic malalignment. DEGENERATIVE CHANGES: Slight degenerative anterolisthesis is present at C3-C4 and C4-C5. Chronic endplate changes are present at C3-C4, C5-C6 and C6-C7. SOFT TISSUES: No prevertebral soft tissue swelling. LUNGS: Medial right upper lobe airspace disease and bronchiectasis presumably  secondary to prior radiation, is stable. IMPRESSION: 1. No acute abnormality of the cervical spine related to neck trauma. 2. Straightening of the normal cervical lordosis. 3. Slight degenerative anterolisthesis at C3-4 and C4-5. 4. Chronic endplate changes at C3-4, C5-6, and C6-7. Electronically signed by: Lonni Necessary MD 05/24/2024 02:44 PM EDT RP Workstation: [id number]   CT Head Wo  Contrast Result Date: 05/24/2024 EXAM: CT HEAD WITHOUT CONTRAST 05/24/2024 02:33:36 PM TECHNIQUE: CT of the head was performed without the administration of intravenous contrast. Automated exposure control, iterative reconstruction, and/or weight based adjustment of the mA/kV was utilized to reduce the radiation dose to as low as reasonably achievable. COMPARISON: None available. CLINICAL HISTORY: BIB EMS due to fall and left hip pain, sharp pain when trying to stand and radiates No noted shortening or rotation, she got tangled in dog leash fell back and hit head on cabinet. 150/100-90-95% RA FINDINGS: BRAIN AND VENTRICLES: No acute hemorrhage. No evidence of acute infarct. No hydrocephalus. No extra-axial collection. No mass effect or midline shift. Age-related cerebral volume loss and mild-to-moderate periventricular and deep cerebral white matter disease. 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 Mild calcific atheromatous disease. ORBITS: No acute abnormality. Status post bilateral lens replacement. SINUSES: No acute abnormality. SOFT TISSUES AND SKULL: No acute soft tissue abnormality. No skull fracture. IMPRESSION: 1. No acute intracranial abnormality related to the reported fall and head trauma. 2. Age-related cerebral volume loss and mild-to-moderate periventricular and deep cerebral white matter disease. 3. 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 Electronically signed by: Lonni Necessary MD 05/24/2024 02:39 PM EDT RP Workstation: [id number]   DG Hip Unilat With Pelvis 2-3 Views Left Result Date: 05/24/2024 CLINICAL DATA:  Fall, left hip and groin pain EXAM: DG HIP (WITH OR WITHOUT PELVIS) 2-3V LEFT COMPARISON:  None Available. FINDINGS: Prior right hip replacement. Fractures in the left superior and inferior pubic rami. The superior pubic ramus fracture courses toward the left acetabulum and possibly involves the medial wall. No subluxation or dislocation. No femoral  neck fracture. IMPRESSION: Left superior and inferior pubic rami fractures. Possible involvement of the medial acetabulum. Electronically Signed   By: Franky Crease M.D.   On: 05/24/2024 12:38   CT Chest Wo Contrast Result Date: 05/15/2024 EXAM: CT CHEST WITHOUT CONTRAST 05/15/2024 03:21:48 PM TECHNIQUE: CT of the chest was performed without the administration of intravenous contrast. Multiplanar reformatted images are provided for review. Automated exposure control, iterative reconstruction, and/or weight based adjustment of the mA/kV was utilized to reduce the radiation dose to as low as reasonably achievable. COMPARISON: CT chest 01/01/2024. CLINICAL HISTORY: Lung nodule, < 6 mm, high cancer risk. Follow-up nodule planned 6 months, moved up to 4 months due to worsening productive cough. 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 FINDINGS: MEDIASTINUM: Heart: Coronary artery and aortic atherosclerotic calcification. No pericardial effusion. Pericardium: No pericardial effusion. The central airways are clear. Similar postsurgical changes from anterior mediastinal mass resection. LYMPH NODES: Evaluation for mediastinal and hilar lymphadenopathy is limited without IV contrast. No definite new or enlarging thoracic lymph nodes. LUNGS AND PLEURA: Bronchiectasis in the right upper lobe and medial left upper lobe changes are presumed due to radiation changes. These are similar to prior. Centrilobular micronodules and ground-glass nodules as well as tree-in-bud nodularity has increased compared to 01/01/2024, this is greatest in the left upper lobe where there is confluent opacification on series 8 image  48 measuring 1.4 x 1.3 cm. The previously measured nodule in the left upper lobe measuring 9 x 4 mm is not substantially changed (series 8 image 51). The previously new nodule in the peripheral right lower lobe  has increased in size now measuring 5 mm (previously 4 mm) with increasing adjacent ground glass opacity. No pleural effusion or pneumothorax. SOFT TISSUES/BONES: Right TSA. No acute fracture or destructive osseous lesion. UPPER ABDOMEN: Limited images of the upper abdomen demonstrates no acute abnormality. IMPRESSION: 1. Worsening mycobacterial infection greatest in the left upper lobe where there is more confluent nodularity. Continued attention on follow up. 2. Slight increase in size and associated surrounding ground glass opacity about a 5 mm right lower lobe nodule. This may be infectious/inflammatory; however continued follow-up imaging is recommended. 3. 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 Electronically signed by: Norman Gatlin MD 05/15/2024 03:45 PM EDT RP Workstation: [id number]   (Echo, Carotid, EGD, Colonoscopy, ERCP)    Subjective: No complaints  Discharge Exam: Vitals:   05/27/24 2213 05/28/24 0601  BP: (!) 101/56 126/70  Pulse: (!) 101 92  Resp:    Temp: 98 F (36.7 C) 98.4 F (36.9 C)  SpO2: 94% 94%   Vitals:   05/27/24 0541 05/27/24 1400 05/27/24 2213 05/28/24 0601  BP: 110/67 (!) 92/58 (!) 101/56 126/70  Pulse: 93 (!) 107 (!) 101 92  Resp: 18 14    Temp: 98.1 F (36.7 C) 99.1 F (37.3 C) 98 F (36.7 C) 98.4 F (36.9 C)  TempSrc: Oral Oral Oral Oral  SpO2: 100% 93% 94% 94%  Weight:      Height:        General: Pt is alert, awake, not in acute distress Cardiovascular: RRR, S1/S2 +, no rubs, no gallops Respiratory: CTA bilaterally, no wheezing, no rhonchi Abdominal: Soft, NT, ND, bowel sounds + Extremities: no edema, no cyanosis    The results of significant diagnostics from this hospitalization (including imaging, microbiology, ancillary and laboratory) are listed below for reference.     Microbiology: No results found for this or any previous visit (from the past 240 hours).    Labs: BNP (last 3 results) No results for input(s): BNP in the last 8760 hours. Basic Metabolic Panel: Recent Labs  Lab 05/24/24 1837 05/25/24 0409 05/26/24 0349  NA 141 140 143  K 4.9 5.2* 4.8  CL 105 104 105  CO2 28 28 30   GLUCOSE 85 99 117*  BUN 33* 33* 31*  CREATININE 1.83* 1.83* 1.88*  CALCIUM  9.6 9.4 9.0  MG 2.4  --   --    Liver Function Tests: Recent Labs  Lab 05/24/24 1837 05/25/24 0409  AST 37 32  ALT 48* 43  ALKPHOS 84 82  BILITOT 0.4 0.4  PROT 6.6 6.3*  ALBUMIN  3.8 3.7   No results for input(s): LIPASE, AMYLASE in the last 168 hours. No results for input(s): AMMONIA in the last 168 hours. CBC: Recent Labs  Lab 05/24/24 1837 05/25/24 0409 05/26/24 0744 05/27/24 0317  WBC 7.8 7.3 6.4 6.6  NEUTROABS 5.8  --   --   --   HGB 12.3 11.6* 10.8* 10.6*  HCT 41.2 38.7 36.8 34.9*  MCV 95.2 96.8 97.1 96.1  PLT 196 194 170 161   Cardiac Enzymes: Recent Labs  Lab 05/24/24 1837  CKTOTAL 37*   BNP: Invalid input(s): POCBNP CBG: No results for input(s): GLUCAP in the last 168 hours. D-Dimer No results for  input(s): DDIMER in the last 72 hours. Hgb A1c No results for input(s): HGBA1C in the last 72 hours. Lipid Profile No results for input(s): CHOL, HDL, LDLCALC, TRIG, CHOLHDL, LDLDIRECT in the last 72 hours. Thyroid  function studies No results for input(s): TSH, T4TOTAL, T3FREE, THYROIDAB in the last 72 hours.  Invalid input(s): FREET3 Anemia work up No results for input(s): VITAMINB12, FOLATE, FERRITIN, TIBC, IRON, RETICCTPCT in the last 72 hours. Urinalysis    Component Value Date/Time   COLORURINE YELLOW 08/03/2022 1005   APPEARANCEUR CLEAR 08/03/2022 1005   LABSPEC 1.014 08/03/2022 1005   PHURINE 6.0 08/03/2022 1005   GLUCOSEU NEGATIVE 08/03/2022 1005   GLUCOSEU NEGATIVE 03/10/2017 1418   HGBUR NEGATIVE 08/03/2022 1005   HGBUR moderate 07/02/2008 1513   BILIRUBINUR NEGATIVE 08/03/2022  1005   BILIRUBINUR neg 11/02/2021 1144   KETONESUR NEGATIVE 08/03/2022 1005   PROTEINUR NEGATIVE 08/03/2022 1005   UROBILINOGEN 0.2 11/02/2021 1144   UROBILINOGEN 0.2 03/10/2017 1418   NITRITE NEGATIVE 08/03/2022 1005   LEUKOCYTESUR TRACE (A) 08/03/2022 1005   Sepsis Labs Recent Labs  Lab 05/24/24 1837 05/25/24 0409 05/26/24 0744 05/27/24 0317  WBC 7.8 7.3 6.4 6.6   Microbiology No results found for this or any previous visit (from the past 240 hours).   Time coordinating discharge: Over 30 minutes  SIGNED:   Erle Odell Castor, MD  [facility] 05/28/2024, 9:51 AM Pager   If 7PM-7AM, please contact night-coverage [email or web address] Password [email or web address]

## 2024-05-28 NOTE — TOC Progression Note (Addendum)
 Transition of Care [facility]) - Progression Note    Patient Details  Name: Sandra Craig MRN: [id number] Date of Birth: 05-16-1941  [facility]) CM/SW Contact  Alfonse JONELLE Rex, RN Phone Number: 05/28/2024, 1:32 PM  Clinical Narrative:   PASRR [id number] A  -2:46pm Met with patient and her sister to review SNF bed offers ( [facility], [place], [place]), pt/sister request time to review bed offers, TOC will f/u in am.     Expected Discharge Plan: Skilled Nursing Facility Barriers to Discharge: Continued Medical Work up               Expected Discharge Plan and Services       Living arrangements for the past 2 months: Single Family Home Expected Discharge Date: 05/27/24                                     Social Drivers of Health (SDOH) Interventions SDOH Screenings   Food Insecurity: No Food Insecurity (05/24/2024)  Housing: Low Risk  (05/24/2024)  Transportation Needs: No Transportation Needs (05/24/2024)  Utilities: Not At Risk (05/24/2024)  Alcohol Screen: Low Risk  (01/10/2024)  Depression (PHQ2-9): Low Risk  (05/15/2024)  Financial Resource Strain: Low Risk  (05/14/2024)  Physical Activity: Unknown (05/14/2024)  Social Connections: Socially Integrated (05/24/2024)  Stress: Stress Concern Present (05/14/2024)  Tobacco Use: Medium Risk (05/24/2024)  Health Literacy: Adequate Health Literacy (01/10/2024)    Readmission Risk Interventions    05/27/2024    4:16 PM  Readmission Risk Prevention Plan  Post Dischage Appt Complete  Medication Screening Complete  Transportation Screening Complete

## 2024-05-28 NOTE — Progress Notes (Signed)
 Occupational Therapy Treatment Patient Details Name: Sandra Craig MRN: [id number] DOB: 1940/08/25 Today's Date: 05/28/2024   History of present illness 83 y.o. female who had a ground-level fall and noted immediate onset of left hip pain.  Was unable to weight-bear.  She presented to the ER where she was found to have a pelvic ring injury, left upper and inferior pubic ramus fracture with involvement of the acetabulum medial wall. admitted to the medicine service and orthopedics consulted.  PMH: HTN, HLD, R TSA 2013, , R hemiarthroplsasty in 2008, fibromyalgia, CKD   OT comments  Patient seen for skilled OT session this afternoon. Patient open to all therapy presented including RW use for [facility] access and assisted amb to recliner with RW. Improved STS overall with somewhat less pain during mobility. Patient will benefit from continued inpatient follow up therapy, <3 hours/day. Patient requires continued Acute care hospital level OT services to progress safety and functional performance and allow for discharge.        If plan is discharge home, recommend the following:  Two people to help with walking and/or transfers;A lot of help with bathing/dressing/bathroom;Assistance with cooking/housework;Assist for transportation;Help with stairs or ramp for entrance   Equipment Recommendations  Wheelchair cushion (measurements OT);Wheelchair (measurements OT);Hospital bed;BSC/3in1       Precautions / Restrictions Precautions Precautions: Fall Restrictions Weight Bearing Restrictions Per Provider Order: No LLE Weight Bearing Per Provider Order: Weight bearing as tolerated       Mobility Bed Mobility Overal bed mobility: Needs Assistance Bed Mobility: Supine to Sit     Supine to sit: Min assist, Mod assist     General bed mobility comments: increased time for LE's to move off bed latgerally maintaining spinal alignment    Transfers Overall transfer level: Needs assistance Equipment  used: Rolling walker (2 wheels) Transfers: Sit to/from Stand, Bed to chair/wheelchair/BSC Sit to Stand: Min assist Stand pivot transfers: Min assist   Step pivot transfers: Min assist     General transfer comment: verbal cues, increased time to work thru pain to access commode via RW with 5 ft amb then recliner     Balance Overall balance assessment: Needs assistance Sitting-balance support: Feet supported, Single extremity supported Sitting balance-Leahy Scale: Fair     Standing balance support: Reliant on assistive device for balance, During functional activity Standing balance-Leahy Scale: Poor Standing balance comment: heavy reliance on UEs d/t pain                           ADL either performed or assessed with clinical judgement   ADL Overall ADL's : Needs assistance/impaired Eating/Feeding: Independent;Sitting   Grooming: Wash/dry hands;Wash/dry face;Oral care;Modified independent;Sitting   Upper Body Bathing: Contact guard assist;Sitting   Lower Body Bathing: Maximal assistance;Sitting/lateral leans   Upper Body Dressing : Contact guard assist;Sitting   Lower Body Dressing: Maximal assistance;Sitting/lateral leans   Toilet Transfer: Rolling walker (2 wheels);BSC/3in1;Minimal assistance Toilet Transfer Details (indicate cue type and reason): Step pivot transfer Toileting- Clothing Manipulation and Hygiene: Maximal assistance Toileting - Clothing Manipulation Details (indicate cue type and reason): BM on commode, lateral leans for hygiene     Functional mobility during ADLs: Minimal assistance;Rolling walker (2 wheels) General ADL Comments: BM on commode    Extremity/Trunk Assessment Upper Extremity Assessment Upper Extremity Assessment: Right hand dominant;Overall [facility] for tasks assessed   Lower Extremity Assessment Lower Extremity Assessment: Defer to PT evaluation  Communication Communication Communication: No apparent  difficulties   Cognition Arousal: Alert Behavior During Therapy: WFL for tasks assessed/performed Cognition: No apparent impairments             OT - Cognition Comments: mild decreased STM and slower processing patient attributing to pain meds which appears accurate, no overt cog/safety issues                 Following commands: Intact        Cueing   Cueing Techniques: Verbal cues  Exercises         General Comments no SOB, activity tolerance limited by pain    Pertinent Vitals/ Pain       Pain Assessment Pain Assessment: Faces Faces Pain Scale: Hurts whole lot Pain Location: pelvic area, L>RLE Pain Descriptors / Indicators: Grimacing, Guarding Pain Intervention(s): Limited activity within patient's tolerance, Monitored during session, Repositioned, Premedicated before session, Relaxation, Ice applied   Frequency  Min 2X/week        Progress Toward Goals  OT Goals(current goals can now be found in the care plan section)  Progress towards OT goals: Progressing toward goals  Acute Rehab OT Goals Patient Stated Goal: to use the bathroom OT Goal Formulation: With patient Time For Goal Achievement: 06/08/24 Potential to Achieve Goals: Fair ADL Goals Pt Will Perform Lower Body Bathing: with min assist;with adaptive equipment;sit to/from stand Pt Will Perform Lower Body Dressing: with min assist;with adaptive equipment;sit to/from stand Pt Will Transfer to Toilet: with +2 assist;with min assist;bedside commode;stand pivot transfer Pt Will Perform Toileting - Clothing Manipulation and hygiene: with min assist;sitting/lateral leans  Plan         AM-PAC OT 6 Clicks Daily Activity     Outcome Measure   Help from another person eating meals?: None Help from another person taking care of personal grooming?: None Help from another person toileting, which includes using toliet, bedpan, or urinal?: A Lot Help from another person bathing (including washing,  rinsing, drying)?: A Lot Help from another person to put on and taking off regular upper body clothing?: A Little Help from another person to put on and taking off regular lower body clothing?: A Lot 6 Click Score: 17    End of Session Equipment Utilized During Treatment: Gait belt;Rolling walker (2 wheels)  OT Visit Diagnosis: Unsteadiness on feet (R26.81);Other abnormalities of gait and mobility (R26.89);History of falling (Z91.81);Pain Pain - Right/Left: Right Pain - part of body: Hip   Activity Tolerance Patient limited by pain   Patient Left in chair;with call bell/phone within reach;with chair alarm set   Nurse Communication Mobility status        Time: 8884-8799 OT Time Calculation (min): 45 min  Charges: OT General Charges $OT Visit: 1 Visit OT Treatments $Self Care/Home Management : 8-22 mins $Therapeutic Activity: 8-22 mins  Brittnie Lewey OT/L Acute Rehabilitation Department  [phone]  05/28/2024, 1:02 PM

## 2024-05-28 NOTE — Plan of Care (Signed)

## 2024-05-29 DIAGNOSIS — G8911 Acute pain due to trauma: Secondary | ICD-10-CM | POA: Diagnosis not present

## 2024-05-29 DIAGNOSIS — S32512D Fracture of superior rim of left pubis, subsequent encounter for fracture with routine healing: Secondary | ICD-10-CM | POA: Diagnosis not present

## 2024-05-29 DIAGNOSIS — F5101 Primary insomnia: Secondary | ICD-10-CM | POA: Diagnosis not present

## 2024-05-29 DIAGNOSIS — M797 Fibromyalgia: Secondary | ICD-10-CM | POA: Diagnosis not present

## 2024-05-29 DIAGNOSIS — Z96611 Presence of right artificial shoulder joint: Secondary | ICD-10-CM | POA: Diagnosis not present

## 2024-05-29 DIAGNOSIS — F331 Major depressive disorder, recurrent, moderate: Secondary | ICD-10-CM | POA: Diagnosis not present

## 2024-05-29 DIAGNOSIS — Z9181 History of falling: Secondary | ICD-10-CM | POA: Diagnosis not present

## 2024-05-29 DIAGNOSIS — S32592D Other specified fracture of left pubis, subsequent encounter for fracture with routine healing: Secondary | ICD-10-CM | POA: Diagnosis not present

## 2024-05-29 DIAGNOSIS — F4323 Adjustment disorder with mixed anxiety and depressed mood: Secondary | ICD-10-CM | POA: Diagnosis not present

## 2024-05-29 DIAGNOSIS — R2681 Unsteadiness on feet: Secondary | ICD-10-CM | POA: Diagnosis not present

## 2024-05-29 DIAGNOSIS — R5383 Other fatigue: Secondary | ICD-10-CM | POA: Diagnosis not present

## 2024-05-29 DIAGNOSIS — Z8673 Personal history of transient ischemic attack (TIA), and cerebral infarction without residual deficits: Secondary | ICD-10-CM | POA: Diagnosis not present

## 2024-05-29 DIAGNOSIS — M5432 Sciatica, left side: Secondary | ICD-10-CM | POA: Diagnosis not present

## 2024-05-29 DIAGNOSIS — Z7401 Bed confinement status: Secondary | ICD-10-CM | POA: Diagnosis not present

## 2024-05-29 DIAGNOSIS — M6281 Muscle weakness (generalized): Secondary | ICD-10-CM | POA: Diagnosis not present

## 2024-05-29 DIAGNOSIS — F3342 Major depressive disorder, recurrent, in full remission: Secondary | ICD-10-CM | POA: Diagnosis not present

## 2024-05-29 DIAGNOSIS — S32473D Displaced fracture of medial wall of unspecified acetabulum, subsequent encounter for fracture with routine healing: Secondary | ICD-10-CM | POA: Diagnosis not present

## 2024-05-29 NOTE — Progress Notes (Signed)
 Physical Therapy Treatment Patient Details Name: Sandra Craig MRN: [id number] DOB: [birth year] Today's Date: 05/29/2024   History of Present Illness 83 y.o. female who had a ground-level fall and noted immediate onset of left hip pain.  Was unable to weight-bear.  She presented to the ER where she was found to have a pelvic ring injury, left upper and inferior pubic ramus fracture with involvement of the acetabulum medial wall. admitted to the medicine service and orthopedics consulted.  PMH: HTN, HLD, R TSA 2013, , R hemiarthroplsasty in 2008, fibromyalgia, CKD    PT Comments  Pt is making steady progress. Decr assist needed with bed mobility and transfers; amb ~ 18' with RW, and min assist,  incr time needed d/t pain however overall much improved pain control/activity tolerance. D/c plan remains appropriate at this time. Pt states her husband is in the hospital in [place] and they hope to be able to go to same facility.    If plan is discharge home, recommend the following: A lot of help with walking and/or transfers;Assist for transportation;Help with stairs or ramp for entrance;Assistance with cooking/housework   Can travel by private vehicle     No  Equipment Recommendations  None recommended by PT    Recommendations for Other Services       Precautions / Restrictions Precautions Precautions: Fall Restrictions Weight Bearing Restrictions Per Provider Order: No LLE Weight Bearing Per Provider Order: Weight bearing as tolerated     Mobility  Bed Mobility Overal bed mobility: Needs Assistance Bed Mobility: Supine to Sit     Supine to sit: Supervision, Contact guard     General bed mobility comments: increased time    Transfers Overall transfer level: Needs assistance Equipment used: Rolling walker (2 wheels) Transfers: Sit to/from Stand Sit to Stand: Min assist           General transfer comment: verbal cues for proper hand placement and LE position. incr time  needed d/t pain    Ambulation/Gait Ambulation/Gait assistance: Min assist Gait Distance (Feet): 18 Feet Assistive device: Rolling walker (2 wheels) Gait Pattern/deviations: Step-to pattern, Antalgic, Decreased step length - right, Decreased step length - left Gait velocity: decr     General Gait Details: incr time needed, intermittent assist to advance LLE d/t pain. cues for RW position, sequence, use of UEs to off load LLE for pain control   Stairs             Wheelchair Mobility     Tilt Bed    Modified Rankin (Stroke Patients Only)       Balance   Sitting-balance support: Feet supported, Single extremity supported Sitting balance-Leahy Scale: Fair Sitting balance - Comments: not challenged d/t pain   Standing balance support: Reliant on assistive device for balance, During functional activity Standing balance-Leahy Scale: Poor Standing balance comment: moderate reliance on UEs d/t pain                            Communication Communication Communication: No apparent difficulties  Cognition Arousal: Alert Behavior During Therapy: WFL for tasks assessed/performed   PT - Cognitive impairments: No apparent impairments                         Following commands: Intact      Cueing Cueing Techniques: Verbal cues  Exercises General Exercises - Lower Extremity Heel Slides: AAROM, AROM, Both, 10 reps  General Comments        Pertinent Vitals/Pain Pain Assessment Pain Assessment: Faces Faces Pain Scale: Hurts even more Pain Location: pelvic area, L>RLE Pain Descriptors / Indicators: Grimacing, Guarding Pain Intervention(s): Limited activity within patient's tolerance, Monitored during session, Premedicated before session, Repositioned, Ice applied    Home Living                          Prior Function            PT Goals (current goals can now be found in the care plan section) Acute Rehab PT Goals Patient  Stated Goal: back to independence PT Goal Formulation: With patient Time For Goal Achievement: 06/08/24 Potential to Achieve Goals: Good Progress towards PT goals: Progressing toward goals    Frequency    Min 4X/week      PT Plan      Co-evaluation              AM-PAC PT 6 Clicks Mobility   Outcome Measure  Help needed turning from your back to your side while in a flat bed without using bedrails?: A Little Help needed moving from lying on your back to sitting on the side of a flat bed without using bedrails?: A Little Help needed moving to and from a bed to a chair (including a wheelchair)?: A Little Help needed standing up from a chair using your arms (e.g., wheelchair or bedside chair)?: A Little Help needed to walk in hospital room?: A Lot Help needed climbing 3-5 steps with a railing? : Total 6 Click Score: 15    End of Session Equipment Utilized During Treatment: Gait belt Activity Tolerance: Patient tolerated treatment well Patient left: in chair;with call bell/phone within reach;with chair alarm set;with family/visitor present Nurse Communication: Mobility status PT Visit Diagnosis: Other abnormalities of gait and mobility (R26.89)     Time: 8946-8885 PT Time Calculation (min) (ACUTE ONLY): 21 min  Charges:    $Gait Training: 8-22 mins PT General Charges $$ ACUTE PT VISIT: 1 Visit                     Maleeya Peterkin, PT  Acute Rehab Dept ([facility]) [phone]  05/29/2024    [facility] 05/29/2024, 11:22 AM

## 2024-05-29 NOTE — Progress Notes (Signed)
 Report called and given to [facility] at [facility]. Per social work [facility] has been called.

## 2024-05-29 NOTE — Progress Notes (Signed)
 Patient was supposed to be discharged to SNF on 05/28/2024 but still waiting for the same.  Patient seen and examined at bedside.  Patient currently medically stable for discharge to SNF.  Please refer to the full discharge summary done by Dr. Odell Castor on 05/28/2024 for full details.

## 2024-05-29 NOTE — TOC Transition Note (Signed)
 Transition of Care [facility]) - Discharge Note   Patient Details  Name: Sandra Craig MRN: [id number] Date of Birth: [birth year]  [facility]) CM/SW Contact:  Alfonse JONELLE Rex, RN Phone Number: 05/29/2024, 11:43 AM   Clinical Narrative:   DC to SNF [facility]), RM 701p, patient's sister at bedside. [facility] for transport. No further TOC needs identified at this time.     Final next level of care: Skilled Nursing Facility [facility]) Barriers to Discharge: Barriers Resolved   Patient Goals and CMS Choice Patient states their goals for this hospitalization and ongoing recovery are:: short term rehab prior to return home CMS Medicare.gov Compare Post Acute Care list provided to:: Patient Choice offered to / list presented to : Patient [place] ownership interest in [facility].provided to:: Patient    Discharge Placement              Patient chooses bed at: [facility] Patient to be transferred to facility by: PT[facility] Name of family member notified: patient's sister at  bedside Patient and family notified of of transfer: 05/29/24  Discharge Plan and Services Additional resources added to the After Visit Summary for                                       Social Drivers of Health (SDOH) Interventions SDOH Screenings   Food Insecurity: No Food Insecurity (05/24/2024)  Housing: Low Risk  (05/24/2024)  Transportation Needs: No Transportation Needs (05/24/2024)  Utilities: Not At Risk (05/24/2024)  Alcohol Screen: Low Risk  (01/10/2024)  Depression (PHQ2-9): Low Risk  (05/15/2024)  Financial Resource Strain: Low Risk  (05/14/2024)  Physical Activity: Unknown (05/14/2024)  Social Connections: Socially Integrated (05/24/2024)  Stress: Stress Concern Present (05/14/2024)  Tobacco Use: Medium Risk (05/24/2024)  Health Literacy: Adequate Health Literacy (01/10/2024)     Readmission Risk Interventions    05/29/2024   11:42 AM 05/27/2024     4:16 PM  Readmission Risk Prevention Plan  Post Dischage Appt Complete Complete  Medication Screening Complete Complete  Transportation Screening Complete Complete

## 2024-05-29 NOTE — TOC Progression Note (Addendum)
 Transition of Care [facility]) - Progression Note    Patient Details  Name: Sandra Craig MRN: [id number] Date of Birth: 08-28-1940  [facility]) CM/SW Contact  Alfonse JONELLE Rex, RN Phone Number: 05/29/2024, 10:42 AM  Clinical Narrative:  Met with patient at bedside to review SNF bed offers, patient accepted bed offer at [facility], admit coord w/SNF notified. SNF auth initiated via [facility]. Auth ID [id number], shara pending.     -11:12am SNF auth approved,  Jluy [id number], days approved 05/29/2024-05/31/2024   Expected Discharge Plan: Skilled Nursing Facility Barriers to Discharge: Continued Medical Work up               Expected Discharge Plan and Services       Living arrangements for the past 2 months: Single Family Home Expected Discharge Date: 05/29/24                                     Social Drivers of Health (SDOH) Interventions SDOH Screenings   Food Insecurity: No Food Insecurity (05/24/2024)  Housing: Low Risk  (05/24/2024)  Transportation Needs: No Transportation Needs (05/24/2024)  Utilities: Not At Risk (05/24/2024)  Alcohol Screen: Low Risk  (01/10/2024)  Depression (PHQ2-9): Low Risk  (05/15/2024)  Financial Resource Strain: Low Risk  (05/14/2024)  Physical Activity: Unknown (05/14/2024)  Social Connections: Socially Integrated (05/24/2024)  Stress: Stress Concern Present (05/14/2024)  Tobacco Use: Medium Risk (05/24/2024)  Health Literacy: Adequate Health Literacy (01/10/2024)    Readmission Risk Interventions    05/27/2024    4:16 PM  Readmission Risk Prevention Plan  Post Dischage Appt Complete  Medication Screening Complete  Transportation Screening Complete

## 2024-05-30 DIAGNOSIS — S32512D Fracture of superior rim of left pubis, subsequent encounter for fracture with routine healing: Secondary | ICD-10-CM | POA: Diagnosis not present

## 2024-05-30 DIAGNOSIS — F3342 Major depressive disorder, recurrent, in full remission: Secondary | ICD-10-CM | POA: Diagnosis not present

## 2024-05-30 DIAGNOSIS — S32592D Other specified fracture of left pubis, subsequent encounter for fracture with routine healing: Secondary | ICD-10-CM | POA: Diagnosis not present

## 2024-05-30 DIAGNOSIS — Z9181 History of falling: Secondary | ICD-10-CM | POA: Diagnosis not present

## 2024-05-31 DIAGNOSIS — S32473D Displaced fracture of medial wall of unspecified acetabulum, subsequent encounter for fracture with routine healing: Secondary | ICD-10-CM | POA: Diagnosis not present

## 2024-05-31 DIAGNOSIS — S32592D Other specified fracture of left pubis, subsequent encounter for fracture with routine healing: Secondary | ICD-10-CM | POA: Diagnosis not present

## 2024-05-31 DIAGNOSIS — G8911 Acute pain due to trauma: Secondary | ICD-10-CM | POA: Diagnosis not present

## 2024-05-31 DIAGNOSIS — S32512D Fracture of superior rim of left pubis, subsequent encounter for fracture with routine healing: Secondary | ICD-10-CM | POA: Diagnosis not present

## 2024-06-03 DIAGNOSIS — F5101 Primary insomnia: Secondary | ICD-10-CM | POA: Diagnosis not present

## 2024-06-03 DIAGNOSIS — F331 Major depressive disorder, recurrent, moderate: Secondary | ICD-10-CM | POA: Diagnosis not present

## 2024-06-04 ENCOUNTER — Ambulatory Visit (INDEPENDENT_AMBULATORY_CARE_PROVIDER_SITE_OTHER): Admitting: Psychology

## 2024-06-04 DIAGNOSIS — S32592D Other specified fracture of left pubis, subsequent encounter for fracture with routine healing: Secondary | ICD-10-CM | POA: Diagnosis not present

## 2024-06-04 DIAGNOSIS — F331 Major depressive disorder, recurrent, moderate: Secondary | ICD-10-CM

## 2024-06-04 DIAGNOSIS — S32473D Displaced fracture of medial wall of unspecified acetabulum, subsequent encounter for fracture with routine healing: Secondary | ICD-10-CM | POA: Diagnosis not present

## 2024-06-04 DIAGNOSIS — S32512D Fracture of superior rim of left pubis, subsequent encounter for fracture with routine healing: Secondary | ICD-10-CM | POA: Diagnosis not present

## 2024-06-04 DIAGNOSIS — M797 Fibromyalgia: Secondary | ICD-10-CM | POA: Diagnosis not present

## 2024-06-04 NOTE — Progress Notes (Signed)
 [facility] Counselor/Therapist Progress Note  Patient ID: Sandra Craig, MRN: [id number],    Date: 06/04/2024  Time Spent: 3:00pm-3:45pm   45 minutes   Treatment Type: Individual Therapy  Reported Symptoms: stress  Mental Status Exam: Appearance:  Casual     Behavior: Appropriate  Motor: Normal  Speech/Language:  Normal Rate  Affect: Appropriate  Mood: normal  Thought process: normal  Thought content:   WNL  Sensory/Perceptual disturbances:   WNL  Orientation: oriented to person, place, time/date, and situation  Attention: Good  Concentration: Good  Memory: WNL  Fund of knowledge:  Good  Insight:   Good  Judgment:  Good  Impulse Control: Good   Risk Assessment: Danger to Self:  No Self-injurious Behavior: No Danger to Others: No Duty to Warn:no Physical Aggression / Violence:No  Access to Firearms a concern: No  Gang Involvement:No   Subjective: Pt present for face-to-face individual therapy via video.  Pt consents to telehealth video session and is aware of limitations and benefits of virtual sessions.  Location of pt: home Location of therapist: home office.  Pt talked about her health.  She fell a couple of weeks ago and fractured her pelvis.  Pt is in a lot of pain.  Pt is at a rehab facility.  [name] also had a turn for the worst and the cancer has come back aggressively.  Charlie is in the rehab facility with pt and is talking about wanting to go into Hospice.   Pt is feeling overwhelmed with all that is happening.  She and Charlie are having a lot of visits from family and friends so they do have a good support system.   Pt has reached out to her friends and family for support.   [name]'s sons are a good support as well.  Pt states she is stable emotionally bc she is relying on her faith, humor, and family. Worked on self care strategies. Provided supportive therapy.    Interventions: Cognitive Behavioral Therapy and  Insight-Oriented  Diagnosis: F33.1   Plan of Care: Recommend ongoing therapy.   Pt participated in setting treatment goals.  Pt wants to have someone to talk to and to improve coping skills.  Pt wants to feel less anxious and depressed.  Plan to meet monthly.    Pt agrees with treatment plan.     Treatment Plan (Treatment Plan Target Date: 11/08/2024) Client Abilities/Strengths  Pt is bright, engaging, and motivated for therapy.   Client Treatment Preferences  Individual therapy.  Client Statement of Needs  Improve coping skills.  Symptoms  Depressed or irritable mood. Diminished interest in or enjoyment of activities. Lack of energy. Feelings of hopelessness, worthlessness, or inappropriate guilt. Unresolved grief issues.  Problems Addressed  Unipolar Depression Goals 1. Alleviate depressive symptoms and return to previous level of effective functioning. 2. Appropriately grieve the loss in order to normalize mood and to return to previously adaptive level of functioning. Objective Learn and implement behavioral strategies to overcome depression. Target Date: 2024-11-08 Frequency: Monthly  Progress: 40 Modality: individual  Related Interventions Engage the client in behavioral activation, increasing his/her activity level and contact with sources of reward, while identifying processes that inhibit activation.  Use behavioral techniques such as instruction, rehearsal, role-playing, role reversal, as needed, to facilitate activity in the client's daily life; reinforce success. Assist the client in developing skills that increase the likelihood of deriving pleasure from behavioral activation (e.g., assertiveness skills, developing an exercise plan, less internal/more external focus, increased  social involvement); reinforce success. Objective Identify important people in life, past and present, and describe the quality, good and poor, of those relationships. Target Date: 2024-11-08  Frequency:Monthly  Progress: 40 Modality: individual  Related Interventions Conduct Interpersonal Therapy beginning with the assessment of the client's interpersonal inventory of important past and present relationships; develop a case formulation linking depression to grief, interpersonal role disputes, role transitions, and/or interpersonal deficits). Objective Learn and implement problem-solving and decision-making skills. Target Date: 2024-11-08 Frequency: Monthly  Progress: 40 Modality: individual  Related Interventions Conduct Problem-Solving Therapy using techniques such as psychoeducation, modeling, and role-playing to teach client problem-solving skills (i.e., defining a problem specifically, generating possible solutions, evaluating the pros and cons of each solution, selecting and implementing a plan of action, evaluating the efficacy of the plan, accepting or revising the plan); role-play application of the problem-solving skill to a real life issue. Encourage in the client the development of a positive problem orientation in which problems and solving them are viewed as a natural part of life and not something to be feared, despaired, or avoided. 3. Develop healthy interpersonal relationships that lead to the alleviation and help prevent the relapse of depression. 4. Develop healthy thinking patterns and beliefs about self, others, and the world that lead to the alleviation and help prevent the relapse of depression. 5. Recognize, accept, and cope with feelings of depression. Diagnosis F33.1  Conditions For Discharge Achievement of treatment goals and objectives   [name], LCSW

## 2024-06-05 DIAGNOSIS — S32592D Other specified fracture of left pubis, subsequent encounter for fracture with routine healing: Secondary | ICD-10-CM | POA: Diagnosis not present

## 2024-06-05 DIAGNOSIS — Z96611 Presence of right artificial shoulder joint: Secondary | ICD-10-CM | POA: Diagnosis not present

## 2024-06-05 DIAGNOSIS — Z8673 Personal history of transient ischemic attack (TIA), and cerebral infarction without residual deficits: Secondary | ICD-10-CM | POA: Diagnosis not present

## 2024-06-05 DIAGNOSIS — R2681 Unsteadiness on feet: Secondary | ICD-10-CM | POA: Diagnosis not present

## 2024-06-10 DIAGNOSIS — S32592D Other specified fracture of left pubis, subsequent encounter for fracture with routine healing: Secondary | ICD-10-CM | POA: Diagnosis not present

## 2024-06-10 DIAGNOSIS — Z9181 History of falling: Secondary | ICD-10-CM | POA: Diagnosis not present

## 2024-06-10 DIAGNOSIS — R2681 Unsteadiness on feet: Secondary | ICD-10-CM | POA: Diagnosis not present

## 2024-06-11 DIAGNOSIS — S32512D Fracture of superior rim of left pubis, subsequent encounter for fracture with routine healing: Secondary | ICD-10-CM | POA: Diagnosis not present

## 2024-06-11 DIAGNOSIS — M5432 Sciatica, left side: Secondary | ICD-10-CM | POA: Diagnosis not present

## 2024-06-11 DIAGNOSIS — S32592D Other specified fracture of left pubis, subsequent encounter for fracture with routine healing: Secondary | ICD-10-CM | POA: Diagnosis not present

## 2024-06-11 DIAGNOSIS — S32473D Displaced fracture of medial wall of unspecified acetabulum, subsequent encounter for fracture with routine healing: Secondary | ICD-10-CM | POA: Diagnosis not present

## 2024-06-12 DIAGNOSIS — R2681 Unsteadiness on feet: Secondary | ICD-10-CM | POA: Diagnosis not present

## 2024-06-12 DIAGNOSIS — S32592D Other specified fracture of left pubis, subsequent encounter for fracture with routine healing: Secondary | ICD-10-CM | POA: Diagnosis not present

## 2024-06-12 DIAGNOSIS — Z9181 History of falling: Secondary | ICD-10-CM | POA: Diagnosis not present

## 2024-06-13 DIAGNOSIS — M6281 Muscle weakness (generalized): Secondary | ICD-10-CM | POA: Diagnosis not present

## 2024-06-13 DIAGNOSIS — S32512D Fracture of superior rim of left pubis, subsequent encounter for fracture with routine healing: Secondary | ICD-10-CM | POA: Diagnosis not present

## 2024-06-13 DIAGNOSIS — S32592D Other specified fracture of left pubis, subsequent encounter for fracture with routine healing: Secondary | ICD-10-CM | POA: Diagnosis not present

## 2024-06-13 DIAGNOSIS — Z9181 History of falling: Secondary | ICD-10-CM | POA: Diagnosis not present

## 2024-06-13 DIAGNOSIS — R2681 Unsteadiness on feet: Secondary | ICD-10-CM | POA: Diagnosis not present

## 2024-06-13 DIAGNOSIS — N184 Chronic kidney disease, stage 4 (severe): Secondary | ICD-10-CM | POA: Diagnosis not present

## 2024-06-13 DIAGNOSIS — S32473D Displaced fracture of medial wall of unspecified acetabulum, subsequent encounter for fracture with routine healing: Secondary | ICD-10-CM | POA: Diagnosis not present

## 2024-06-13 NOTE — Telephone Encounter (Signed)
 Message left to return call to [employer] at [phone], option #4.

## 2024-06-14 DIAGNOSIS — S32512D Fracture of superior rim of left pubis, subsequent encounter for fracture with routine healing: Secondary | ICD-10-CM | POA: Diagnosis not present

## 2024-06-14 DIAGNOSIS — M6281 Muscle weakness (generalized): Secondary | ICD-10-CM | POA: Diagnosis not present

## 2024-06-14 DIAGNOSIS — Z9181 History of falling: Secondary | ICD-10-CM | POA: Diagnosis not present

## 2024-06-14 DIAGNOSIS — R2681 Unsteadiness on feet: Secondary | ICD-10-CM | POA: Diagnosis not present

## 2024-06-16 DIAGNOSIS — Z9181 History of falling: Secondary | ICD-10-CM | POA: Diagnosis not present

## 2024-06-16 DIAGNOSIS — S32512D Fracture of superior rim of left pubis, subsequent encounter for fracture with routine healing: Secondary | ICD-10-CM | POA: Diagnosis not present

## 2024-06-16 DIAGNOSIS — M6281 Muscle weakness (generalized): Secondary | ICD-10-CM | POA: Diagnosis not present

## 2024-06-16 DIAGNOSIS — R2681 Unsteadiness on feet: Secondary | ICD-10-CM | POA: Diagnosis not present

## 2024-06-17 DIAGNOSIS — S32592D Other specified fracture of left pubis, subsequent encounter for fracture with routine healing: Secondary | ICD-10-CM | POA: Diagnosis not present

## 2024-06-17 DIAGNOSIS — R2681 Unsteadiness on feet: Secondary | ICD-10-CM | POA: Diagnosis not present

## 2024-06-17 DIAGNOSIS — M6281 Muscle weakness (generalized): Secondary | ICD-10-CM | POA: Diagnosis not present

## 2024-06-17 DIAGNOSIS — Z9181 History of falling: Secondary | ICD-10-CM | POA: Diagnosis not present

## 2024-06-17 DIAGNOSIS — S32512D Fracture of superior rim of left pubis, subsequent encounter for fracture with routine healing: Secondary | ICD-10-CM | POA: Diagnosis not present

## 2024-06-18 DIAGNOSIS — S32592D Other specified fracture of left pubis, subsequent encounter for fracture with routine healing: Secondary | ICD-10-CM | POA: Diagnosis not present

## 2024-06-18 DIAGNOSIS — Z9181 History of falling: Secondary | ICD-10-CM | POA: Diagnosis not present

## 2024-06-18 DIAGNOSIS — R2681 Unsteadiness on feet: Secondary | ICD-10-CM | POA: Diagnosis not present

## 2024-06-18 DIAGNOSIS — N184 Chronic kidney disease, stage 4 (severe): Secondary | ICD-10-CM | POA: Diagnosis not present

## 2024-06-18 DIAGNOSIS — S32512D Fracture of superior rim of left pubis, subsequent encounter for fracture with routine healing: Secondary | ICD-10-CM | POA: Diagnosis not present

## 2024-06-18 DIAGNOSIS — M6281 Muscle weakness (generalized): Secondary | ICD-10-CM | POA: Diagnosis not present

## 2024-06-18 DIAGNOSIS — S32473D Displaced fracture of medial wall of unspecified acetabulum, subsequent encounter for fracture with routine healing: Secondary | ICD-10-CM | POA: Diagnosis not present

## 2024-06-21 DIAGNOSIS — M5432 Sciatica, left side: Secondary | ICD-10-CM | POA: Diagnosis not present

## 2024-06-21 DIAGNOSIS — S32512D Fracture of superior rim of left pubis, subsequent encounter for fracture with routine healing: Secondary | ICD-10-CM | POA: Diagnosis not present

## 2024-06-21 DIAGNOSIS — S32473D Displaced fracture of medial wall of unspecified acetabulum, subsequent encounter for fracture with routine healing: Secondary | ICD-10-CM | POA: Diagnosis not present

## 2024-06-21 DIAGNOSIS — S32592D Other specified fracture of left pubis, subsequent encounter for fracture with routine healing: Secondary | ICD-10-CM | POA: Diagnosis not present

## 2024-06-24 ENCOUNTER — Telehealth: Payer: Self-pay | Admitting: *Deleted

## 2024-06-24 ENCOUNTER — Telehealth: Payer: Self-pay

## 2024-06-24 ENCOUNTER — Telehealth: Payer: Self-pay | Admitting: Family Medicine
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
## 2024-06-24 NOTE — Telephone Encounter (Signed)
 Noted- sorry for loss. We will get a plan together tomorrow at visit

## 2024-06-24 NOTE — Transitions of Care (Post Inpatient/ED Visit) (Signed)
 06/24/2024  Name: Sandra Craig MRN: [id number] DOB: 1941/03/28  Today's TOC FU Call Status: Today's TOC FU Call Status:: Successful TOC FU Call Completed TOC FU Call Complete Date: 06/24/24  Patient's Name and Date of Birth confirmed. DOB, Name  Transition Care Management Follow-up Telephone Call Date of Discharge: 06/21/24 Discharge Facility: Other [facility]) Name of Other (Non-[facility]) Discharge Facility: [facility] Type of Discharge: Inpatient Admission Primary Inpatient Discharge Diagnosis:: Fall w/ pelvic fracture How have you been since you were released from the hospital?: Better Any questions or concerns?: Yes Patient Questions/Concerns:: (S) Still having problems with pain control;  also grieving death of husband who died on 06-21-24 while she was in the hospital Patient Questions/Concerns Addressed: Notified Provider of Patient Questions/Concerns  Items Reviewed: Did you receive and understand the discharge instructions provided?: Yes Medications obtained,verified, and reconciled?: Yes (Medications Reviewed) Any new allergies since your discharge?: No Dietary orders reviewed?: NA Do you have support at home?: Yes People in Home [RPT]: other relative(s), sibling(s)  Medications Reviewed Today: Medications Reviewed Today     Reviewed by Lavelle Charmaine NOVAK, LPN (Licensed Practical Nurse) on 06/24/24 at [phone]  Med List Status: <None>   Medication Order Taking? Sig Documenting Provider Last Dose Status Informant  acetaminophen  (TYLENOL ) 500 MG tablet [id number] Yes Take 1,000 mg by mouth 2 (two) times daily as needed for headache (pain). [provider]  Active Self, Pharmacy Records  aspirin  81 MG tablet [id number] Yes Take 1 tablet (81 mg total) by mouth daily. Whitfield Raisin, NP  Active Self, Pharmacy Records  atorvastatin  (LIPITOR) 40 MG tablet [id number] Yes TAKE 1 TABLET(40 MG) BY MOUTH DAILY  Patient taking differently: Take 40 mg by mouth at  bedtime.   Katrinka Garnette KIDD, MD  Active Self, Pharmacy Records  Calcium  Carb-Cholecalciferol (CALCIUM  + VITAMIN D3 PO) [id number] Yes Take 1 tablet by mouth 2 (two) times daily. [provider]  Active Self, Pharmacy Records  DULoxetine  (CYMBALTA ) 30 MG capsule [id number] Yes TAKE 2 CAPSULE BY MOUTH EVERY MORNING AND 1 CAPSULE BY MOUTH EVERY EVENING  Patient taking differently: Take 30 mg by mouth 2 (two) times daily.   Katrinka Garnette KIDD, MD  Active Self, Pharmacy Records  enoxaparin  (LOVENOX ) 30 MG/0.3ML injection [id number]  Inject 0.3 mLs (30 mg total) into the skin daily for 10 days.  Patient not taking: Reported on 06/24/2024   Odell Celinda Balo, MD  Active   famotidine  (PEPCID ) 20 MG tablet [id number] Yes TAKE 1 TABLET(20 MG) BY MOUTH TWICE DAILY Katrinka Garnette KIDD, MD  Active Self, Pharmacy Records  gabapentin  (NEURONTIN ) 300 MG capsule [id number] Yes TAKE 1 CAPSULE(300 MG) BY MOUTH TWICE DAILY AS NEEDED FOR PAIN Katrinka Garnette KIDD, MD  Active Self, Pharmacy Records           Med Note (COFFELL, [facility] M   Sat May 25, 2024 11:13 AM) Usually only once daily.  HYDROcodone -acetaminophen  (NORCO/VICODIN) 5-325 MG tablet [id number] Yes Take 1 tablet by mouth 2 (two) times daily as needed. [provider]  Active   losartan  (COZAAR ) 25 MG tablet [id number] Yes Take 12.5 mg by mouth daily. [provider]  Active Self, Pharmacy Records  Multiple Vitamins-Minerals (MULTIVITAMIN WOMEN 50+) TABS [id number] Yes Take 1 tablet by mouth 2 (two) times daily. [provider]  Active Self, Pharmacy Records  Polyethyl Glycol-Propyl Glycol (SYSTANE OP) [id number] Yes Place 1 drop into both eyes 2 (two) times daily as needed (eye irritation). [provider]  Active  Self, Pharmacy Records  Romosozumab -aqqg (EVENITY ) 105 MG/1. injection 210 mg [id number]   Glennon Almarie POUR, MD  Active   sucralfate  (CARAFATE ) 1 g tablet [id number] Yes Take 1 tablet (1 g total) by mouth 4  (four) times daily. Dissolve each tablet in 15 cc water  before use.  Patient taking differently: Take 1 g by mouth 4 (four) times daily as needed (somach irritation).   Dewey Rush, MD  Active Self, Pharmacy Records  zolpidem  (AMBIEN ) 5 MG tablet [id number] Yes TAKE 1 TABLET BY MOUTH EVERY DAY AT BEDTIME FOR SLEEP Katrinka Garnette KIDD, MD  Active Self, Pharmacy Records            Home Care and Equipment/Supplies: Were Home Health Services Ordered?: Yes Has Agency set up a time to come to your home?: No Any new equipment or medical supplies ordered?: Yes Were you able to get the equipment/medical supplies?: Yes Do you have any questions related to the use of the equipment/supplies?: No  Functional Questionnaire: Do you need assistance with bathing/showering or dressing?: Yes Do you need assistance with meal preparation?: Yes Do you need assistance with eating?: No Do you have difficulty maintaining continence: No Do you need assistance with getting out of bed/getting out of a chair/moving?: Yes Do you have difficulty managing or taking your medications?: No  Follow up appointments reviewed: PCP Follow-up appointment confirmed?: Yes Date of PCP follow-up appointment?: 06/25/24 Follow-up Provider: Dr. Garnette Katrinka Specialist [facility] Follow-up appointment confirmed?: NA Do you need transportation to your follow-up appointment?: No Do you understand care options if your condition(s) worsen?: Yes-patient verbalized understanding    SIGNATURE Charmaine Bloodgood, LPN [facility] Health Advisor [employer] l [facility] You Are. We Are. [facility] Direct Dial [phone]

## 2024-06-24 NOTE — Telephone Encounter (Signed)
 Pt called requesting earlier appt with provider on 12/10 because of appt conflict. Advised pt that early appts were not available. Advised that provider's PA had an early appt opening. Pt agreed to 0800 appt for that day and verbalized understanding of changes.

## 2024-06-25 ENCOUNTER — Encounter: Payer: Self-pay | Admitting: Family Medicine

## 2024-06-25 ENCOUNTER — Ambulatory Visit: Admitting: Family Medicine

## 2024-06-25 ENCOUNTER — Ambulatory Visit: Payer: Self-pay | Admitting: Family Medicine

## 2024-06-25 VITALS — BP 130/78 | HR 92 | Temp 98.4°F | Ht 65.0 in | Wt 135.2 lb

## 2024-06-25 DIAGNOSIS — F3342 Major depressive disorder, recurrent, in full remission: Secondary | ICD-10-CM

## 2024-06-25 DIAGNOSIS — I10 Essential (primary) hypertension: Secondary | ICD-10-CM

## 2024-06-25 DIAGNOSIS — S32502A Unspecified fracture of left pubis, initial encounter for closed fracture: Secondary | ICD-10-CM

## 2024-06-25 DIAGNOSIS — N1832 Chronic kidney disease, stage 3b: Secondary | ICD-10-CM

## 2024-06-25 DIAGNOSIS — E785 Hyperlipidemia, unspecified: Secondary | ICD-10-CM

## 2024-06-25 LAB — COMPREHENSIVE METABOLIC PANEL WITH GFR
ALT: 10 U/L (ref 0–35)
AST: 13 U/L (ref 0–37)
Albumin: 3.7 g/dL (ref 3.5–5.2)
Alkaline Phosphatase: 139 U/L — ABNORMAL HIGH (ref 39–117)
BUN: 23 mg/dL (ref 6–23)
CO2: 33 meq/L — ABNORMAL HIGH (ref 19–32)
Calcium: 9.7 mg/dL (ref 8.4–10.5)
Chloride: 102 meq/L (ref 96–112)
Creatinine, Ser: 1.62 mg/dL — ABNORMAL HIGH (ref 0.40–1.20)
GFR: 29.48 mL/min — ABNORMAL LOW (ref >60.00)
Glucose, Bld: 90 mg/dL (ref 70–99)
Potassium: 4 meq/L (ref 3.5–5.1)
Sodium: 141 meq/L (ref 135–145)
Total Bilirubin: 0.4 mg/dL (ref 0.2–1.2)
Total Protein: 6.6 g/dL (ref 6.0–8.3)

## 2024-06-25 LAB — CBC WITH DIFFERENTIAL/PLATELET
Basophils Absolute: 0 K/uL (ref 0.0–0.1)
Basophils Relative: 0.4 % (ref 0.0–3.0)
Eosinophils Absolute: 0 K/uL (ref 0.0–0.7)
Eosinophils Relative: 1 % (ref 0.0–5.0)
HCT: 33.7 % — ABNORMAL LOW (ref 36.0–46.0)
Hemoglobin: 11 g/dL — ABNORMAL LOW (ref 12.0–15.0)
Lymphocytes Relative: 16.8 % (ref 12.0–46.0)
Lymphs Abs: 0.9 K/uL (ref 0.7–4.0)
MCHC: 32.8 g/dL (ref 30.0–36.0)
MCV: 88.6 fl (ref 78.0–100.0)
Monocytes Absolute: 0.4 K/uL (ref 0.1–1.0)
Monocytes Relative: 8.3 % (ref 3.0–12.0)
Neutro Abs: 3.8 K/uL (ref 1.4–7.7)
Neutrophils Relative %: 73.5 % (ref 43.0–77.0)
Platelets: 198 K/uL (ref 150.0–400.0)
RBC: 3.8 Mil/uL — ABNORMAL LOW (ref 3.87–5.11)
RDW: 15 % (ref 11.5–15.5)
WBC: 5.1 K/uL (ref 4.0–10.5)

## 2024-06-25 MED ORDER — FAMOTIDINE 20 MG PO TABS: 20.0000 mg | ORAL_TABLET | Freq: Two times a day (BID) | ORAL | 1 refills | Status: AC

## 2024-06-25 NOTE — Assessment & Plan Note (Signed)
 We discussed basal ganglia infarct and thus LDL goal 70 or less.  Mild poor control most recently on atorvastatin  40 mg daily-we may need to consider bumping this dose up at future visit.  Previously on rosuvastatin  but we had to change this due to renal function Lab Results  Component Value Date   CHOL 183 01/05/2024   HDL 64.60 01/05/2024   LDLCALC 87 01/05/2024   LDLDIRECT 59.0 11/03/2020   TRIG 156.0 (H) 01/05/2024   CHOLHDL 3 01/05/2024

## 2024-06-25 NOTE — Patient Instructions (Addendum)
 I think the hydrocodone  every 6-8 hours scheduled for now while we are waiting on follow up from orthopedic would reasonable. We want of the back down once we are able but you are really suffering right now.   Reach out if/when you need more pain medicine and ill reach out if I hear from Dr. Georgina  Ideally would want to space Ambien  at least 6-8 hours from Ambien  and how we are scheduling the hydrocodone  may need to see if you can simply do without it for now  Please stop by lab before you go If you have mychart- we will send your results within 3 business days of us  receiving them.  If you do not have mychart- we will call you about results within 5 business days of us  receiving them.  *please also note that you will see labs on mychart as soon as they post. I will later go in and write notes on them- will say notes from Dr. Katrinka   Recommended follow up: Return in about 2 months (around 08/25/2024) for followup or sooner if needed.Schedule b4 you leave.

## 2024-06-25 NOTE — Assessment & Plan Note (Signed)
 Thankfully she is doing well despite the loss of her husband.  Reports family has been very supportive.  She is continuing Cymbalta  30 mg twice daily.  She does take Ambien  and I recommended against this while on the hydrocodone  due to combined risks    06/25/2024   11:10 AM 05/15/2024   10:58 AM 02/15/2024    9:58 AM  Depression screen PHQ 2/9  Decreased Interest 0 0 0  Down, Depressed, Hopeless 0 0 0  PHQ - 2 Score 0 0 0  Altered sleeping 0 0 0  Tired, decreased energy 0 0 0  Change in appetite 0 0 0  Feeling bad or failure about yourself  0 0 0  Trouble concentrating 0 0 0  Moving slowly or fidgety/restless 0 0 0  Suicidal thoughts 0 0 0  PHQ-9 Score 0 0  0   Difficult doing work/chores Not difficult at all Not difficult at all Not difficult at all     Data saved with a previous flowsheet row definition

## 2024-06-25 NOTE — Addendum Note (Signed)
 Addended by: KATRINKA GARNETTE KIDD on: 06/25/2024 06:47 PM   Modules accepted: Orders

## 2024-06-25 NOTE — Progress Notes (Addendum)
 Phone [phone]   Subjective:  Sandra Craig is a 83 y.o. year old very pleasant female patient who presents for transitional care management and hospital follow up for pelvic fracture. Patient was hospitalized from 05/24/2024 to 05/29/2024 and then was in [facility] re[facility] from 05/29/2024 to 06/21/2024. A TCM phone call was completed on 06/24/24. Medical complexity moderate  Patient presented to the hospital after mechanical fall-she was walking her dog and got tangled in the leash and fell to the ground-found to have left upper and inferior pubic rami fracture that was nondisplaced with involvement of the acetabulum medial wall.  No femur fracture or dislocation-essentially had close pelvic fracture.  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  Her chronic kidney disease stage III thankfully was at baseline  Did have some blood loss anemia acutely which later stabilized-they recommended outpatient follow-up of this  Her other chronic medical conditions including hypertension and hyperlipidemia and depression were stable  Unfortunately she lost her husband on November 6 while in rehab. A lot going on for her  Today she reports, trying to  still do rehab at home- exercises have been very painful though including when doing them at [facility] and at home. Pain has not significantly diminished which has been frustrating for her.  - has upcoming scan of CT chest for cancer follow up  -walking with rollator walker -hydrocodone  5/325 with 2 scheduled morning and night- has only used one of the nonscheduled. Pain remains significant even with the medicine. 2 hours after medicine pain is about 7/10 with mobility.  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    Priority: High   Chronic low back pain 12/20/2013    Priority: High   Fibromyalgia 12/05/2007    Priority: High   UTI (urinary tract infection) 12/15/2018    Priority: Medium    Chronic kidney disease, stage 3b (HCC) 06/02/2017    Priority: Medium    Near syncope 03/11/2017    Priority: Medium    Hyperglycemia 06/27/2012    Priority: Medium    History of small bowel obstruction 06/28/2011    Priority: Medium    GERD with stricture 06/28/2011    Priority: Medium    Hypertension     Priority: Medium    Hyperlipidemia     Priority: Medium    INSOMNIA, CHRONIC 10/08/2008    Priority: Medium    Depression 02/08/2008    Priority: Medium  Facet arthropathy 09/14/2019    Priority: Low   Vaginal dryness 01/29/2019    Priority: Low   Chronic right shoulder pain 03/07/2014    Priority: Low   Benign essential tremor 10/09/2013    Priority: Low   Diverticulitis large intestine 02/11/2012    Priority: Low   Irritable bowel syndrome (IBS) 06/28/2011    Priority: Low   IC (interstitial cystitis)     Priority: Low    Hemorrhoids 10/08/2008    Priority: Low   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 Current Outpatient Medications  Medication Sig Dispense Refill   acetaminophen  (TYLENOL ) 500 MG tablet Take 1,000 mg by mouth 2 (two) times daily as needed for headache (pain).     aspirin  81 MG tablet Take 1 tablet (81 mg total) by mouth daily. 30 tablet    atorvastatin  (LIPITOR) 40 MG tablet TAKE 1 TABLET(40 MG) BY MOUTH DAILY (Patient taking differently: Take 40 mg by mouth at bedtime.) 90 tablet 3   Calcium  Carb-Cholecalciferol (CALCIUM  + VITAMIN D3 PO) Take 1 tablet by mouth 2 (two) times daily.     DULoxetine  (CYMBALTA ) 30 MG capsule TAKE 2 CAPSULE BY MOUTH EVERY MORNING AND 1 CAPSULE BY MOUTH EVERY EVENING (Patient taking differently: Take 30 mg by mouth 2 (two) times daily.) 270 capsule 3   gabapentin  (NEURONTIN ) 300 MG capsule TAKE 1 CAPSULE(300 MG) BY MOUTH TWICE DAILY AS NEEDED FOR PAIN 180 capsule 3   HYDROcodone -acetaminophen  (NORCO/VICODIN) 5-325 MG tablet Take 1 tablet by mouth 2 (two) times daily as needed.     losartan  (COZAAR ) 25 MG tablet Take 12.5 mg by mouth daily.     Multiple Vitamins-Minerals (MULTIVITAMIN WOMEN 50+) TABS Take 1 tablet by mouth 2 (two) times daily.     Polyethyl Glycol-Propyl Glycol (SYSTANE OP) Place 1 drop into both eyes 2 (two) times daily as needed (eye irritation).     sucralfate  (CARAFATE ) 1 g tablet Take 1 tablet (1 g total) by mouth 4 (four) times daily. Dissolve each tablet in 15 cc water  before use. (Patient taking differently: Take 1  g by mouth 4 (four) times daily as needed (somach irritation).) 120 tablet 2   zolpidem  (AMBIEN ) 5 MG tablet TAKE 1 TABLET BY MOUTH EVERY DAY AT BEDTIME FOR SLEEP 90 tablet 1   famotidine  (PEPCID ) 20 MG tablet Take 1 tablet (20 mg total) by mouth 2 (two) times daily. 180 tablet 1   Current Facility-Administered Medications  Medication Dose Route Frequency Provider Last Rate Last Admin   Romosozumab -aqqg (EVENITY ) 105 MG/1. injection 210 mg  210 mg Subcutaneous Once        Objective  Objective:  BP 130/78   Pulse 92   Temp 98.4 F (36.9 C) (Temporal)   Ht 5' 5 (1.651 m)   Wt 135 lb 3.2 oz (61.3 kg)   LMP 08/25/1992   SpO2 94%   BMI 22.50 kg/m  Gen: NAD, resting comfortably CV: RRR no murmurs rubs or gallops Lungs: CTAB no crackles, wheeze, rhonchi Ext: no edema Skin: warm, dry Neuro: Wheelchair-bound today-pain in  the groin with leg extension   Assessment and Plan:   #TCM/hospital follow up  - TCM billed but would be [id number] if does not meet qualifications  Assessment & Plan Closed fracture of left pubis, unspecified portion of pubis, initial encounter [facility]) Patient with ongoing significant pain.  The hospital team appears scheduled follow-up with Dr. Georgina of [facility] Ortho care.  Patient had previously seen Beverley Millman and in hospital stated desire to see them 2 week after discharge per his note but today she stats only seeing Dr. Georgina so I reached out to him and he suggested AP/inlet/outlet pelvis views at upcoming visit with him-offered to order for him now but he reported good set up to do it then.  Unfortunately her pain is not well-controlled with twice daily dosing of hydrocodone -discussed half-life of this and I think she would benefit from 3 times a day for now.  She is aware of addictive potential and she wants to be very cautious but I still think short-term until we get more information it would benefit her to take more frequently.  Obviously we want to taper down as  quickly as possible when able to Primary hypertension Well-controlled with losartan  12.5 mg daily-continue current medication Hyperlipidemia, unspecified hyperlipidemia type We discussed basal ganglia infarct and thus LDL goal 70 or less.  Mild poor control most recently on atorvastatin  40 mg daily-we may need to consider bumping this dose up at future visit.  Previously on rosuvastatin  but we had to change this due to renal function Lab Results  Component Value Date   CHOL 183 01/05/2024   HDL 64.60 01/05/2024   LDLCALC 87 01/05/2024   LDLDIRECT 59.0 11/03/2020   TRIG 156.0 (H) 01/05/2024   CHOLHDL 3 01/05/2024    Recurrent major depressive disorder, in full remission Thankfully she is doing well despite the loss of her husband.  Reports family has been very supportive.  She is continuing Cymbalta  30 mg twice daily.  She does take Ambien  and I recommended against this while on the hydrocodone  due to combined risks    06/25/2024   11:10 AM 05/15/2024   10:58 AM 02/15/2024    9:58 AM  Depression screen PHQ 2/9  Decreased Interest 0 0 0  Down, Depressed, Hopeless 0 0 0  PHQ - 2 Score 0 0 0  Altered sleeping 0 0 0  Tired, decreased energy 0 0 0  Change in appetite 0 0 0  Feeling bad or failure about yourself  0 0 0  Trouble concentrating 0 0 0  Moving slowly or fidgety/restless 0 0 0  Suicidal thoughts 0 0 0  PHQ-9 Score 0 0  0   Difficult doing work/chores Not difficult at all Not difficult at all Not difficult at all     Data saved with a previous flowsheet row definition   Chronic kidney disease, stage 3b (HCC) Creatinine was running in the 1.8-1.9 range in the hospital.  Fortunately today slightly better at 1.62.  GFR listed as 29.5 so this may be moving into stage IV range-continue to monitor closely and follow-up with Dr. Gearline of nephrology  Recommended follow up: Return in about 2 months (around 08/25/2024) for followup or sooner if needed.Schedule b4 you leave. Future  Appointments  Date Time Provider Department Center  07/02/2024  9:30 AM [facility] None  07/02/2024 10:30 AM [facility] 1 [facility] [place]  07/02/2024  3:00 PM Bauert, Terri W, LCSW [facility] None  07/10/2024  8:00 AM Heilingoetter,  Cassandra L, PA-C [facility] None  07/10/2024  3:30 PM Georgina Ozell LABOR, MD [facility] None  08/06/2024  2:00 PM Bauert, Veva ORN, LCSW [facility] None  09/03/2024  1:20 PM Katrinka Garnette KIDD, MD [facility] [place]  01/14/2025  1:40 PM [facility] ANNUAL WELLNESS VISIT 1 [facility] [place]    Lab/Order associations:   ICD-10-CM   1. Closed fracture of left pubis, unspecified portion of pubis, initial encounter (HCC)  S32.502A DG Pelvis 1-2 Views    2. Primary hypertension  I10 Comprehensive metabolic panel with GFR    CBC with Differential/Platelet    3. Hyperlipidemia, unspecified hyperlipidemia type  E78.5     4. Recurrent major depressive disorder, in full remission  F33.42     5. Chronic kidney disease, stage 3b (HCC)  N18.32       Meds ordered this encounter  Medications   famotidine  (PEPCID ) 20 MG tablet    Sig: Take 1 tablet (20 mg total) by mouth 2 (two) times daily.    Dispense:  180 tablet    Refill:  1   I personally spent a total of 48 minutes in the care of the patient today including preparing to see the patient, getting/reviewing separately obtained history including review and summarization of discharge summary, performing a medically appropriate exam/evaluation, counseling and educating, referring and communicating with other health care professionals including discussing case with Dr. Georgina, and documenting clinical information in the EHR.  Return precautions advised.  Garnette Katrinka, MD

## 2024-06-25 NOTE — Assessment & Plan Note (Signed)
 Well-controlled with losartan  12.5 mg daily-continue current medication

## 2024-06-25 NOTE — Assessment & Plan Note (Signed)
 Creatinine was running in the 1.8-1.9 range in the hospital.  Fortunately today slightly better at 1.62.  GFR listed as 29.5 so this may be moving into stage IV range-continue to monitor closely and follow-up with Dr. Gearline of nephrology

## 2024-06-25 NOTE — Assessment & Plan Note (Addendum)
 Patient with ongoing significant pain.  The hospital team appears scheduled follow-up with Dr. Georgina of [facility] Ortho care.  Patient had previously seen Beverley Millman and in hospital stated desire to see them 2 week after discharge per his note but today she stats only seeing Dr. Georgina so I reached out to him and he suggested AP/inlet/outlet pelvis views at upcoming visit with him-offered to order for him now but he reported good set up to do it then.  Unfortunately her pain is not well-controlled with twice daily dosing of hydrocodone -discussed half-life of this and I think she would benefit from 3 times a day for now.  She is aware of addictive potential and she wants to be very cautious but I still think short-term until we get more information it would benefit her to take more frequently.  Obviously we want to taper down as quickly as possible when able to

## 2024-07-01 ENCOUNTER — Telehealth: Payer: Self-pay | Admitting: Orthopedic Surgery

## 2024-07-01 ENCOUNTER — Ambulatory Visit: Admitting: Orthopedic Surgery

## 2024-07-01 ENCOUNTER — Other Ambulatory Visit: Payer: Self-pay

## 2024-07-01 VITALS — BP 136/81 | HR 93 | Ht 65.0 in | Wt 135.5 lb

## 2024-07-01 DIAGNOSIS — M545 Low back pain, unspecified: Secondary | ICD-10-CM

## 2024-07-01 NOTE — Progress Notes (Signed)
 Orthopedic Surgery Office Note  Injury: pelvic ring injury Date of injury: 05/24/2024 (~5 weeks from injury)   Patient is a 83 year old female who had a ground-level fall and was admitted to [facility] at the end of October.  She was diagnosed with a pelvic ring injury.  I was consulted at that time but she had extensive history with Beverley Economy and wanted to follow-up with them.  She ended up not following up with them.  She comes in today with persistent pain in the left groin region.  She notes that when she is ambulating without her walker.  She does not have much pain when she is walking with her walker.  She has been using Tylenol  to control her pain.  She notes the pain when she is working with physical therapy.  The leg raises and single-leg marching has been painful.  She does not have any pain radiating down the leg.  She is not having any low back pain.  Her husband recently died and that has complicated things.  She is now living alone.  On exam, she is in no acute distress.  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  Disc height loss at L5/S1.  No other significant degenerative changes seen.  No fracture or dislocation seen.   Plan: -No operative plans at this time -Weight bearing as tolerated -Pain management: tylenol  as needed -Should continue to ambulate, discontinue PT at this time as it is only aggravating pain -Explained that most of these fractures do go on to heal with time.  She is still fairly early from a fracture healing standpoint, we will continue to monitor -Follow up in 4 weeks, x-rays at next visit: AP/inlet/outlet pelvis   Ozell DELENA Ada, MD Orthopedic Surgeon

## 2024-07-01 NOTE — Telephone Encounter (Signed)
 I called and advised that I did not have anything before the 07/10/24 but that I would keep her name so that if someone cancels I will call her.

## 2024-07-01 NOTE — Telephone Encounter (Signed)
 Pt called wanting to see if she can get in sooner than the 10th because she can't do the exercises that were given to her by home Health. Call back number is [phone].

## 2024-07-02 ENCOUNTER — Inpatient Hospital Stay: Attending: Physician Assistant

## 2024-07-02 ENCOUNTER — Telehealth: Payer: Self-pay | Admitting: Orthopedic Surgery

## 2024-07-02 ENCOUNTER — Ambulatory Visit (HOSPITAL_COMMUNITY)
Admission: RE | Admit: 2024-07-02 | Discharge: 2024-07-02 | Disposition: A | Discharge status: Home / Self Care | Source: Ambulatory Visit | Attending: Internal Medicine | Admitting: Internal Medicine

## 2024-07-02 ENCOUNTER — Encounter (HOSPITAL_COMMUNITY): Payer: Self-pay

## 2024-07-02 ENCOUNTER — Ambulatory Visit: Admitting: Psychology

## 2024-07-02 DIAGNOSIS — I700 Atherosclerosis of aorta: Secondary | ICD-10-CM | POA: Diagnosis not present

## 2024-07-02 DIAGNOSIS — C3490 Malignant neoplasm of unspecified part of unspecified bronchus or lung: Secondary | ICD-10-CM

## 2024-07-02 DIAGNOSIS — Z923 Personal history of irradiation: Secondary | ICD-10-CM | POA: Insufficient documentation

## 2024-07-02 DIAGNOSIS — I2510 Atherosclerotic heart disease of native coronary artery without angina pectoris: Secondary | ICD-10-CM | POA: Diagnosis not present

## 2024-07-02 DIAGNOSIS — Z85118 Personal history of other malignant neoplasm of bronchus and lung: Secondary | ICD-10-CM | POA: Insufficient documentation

## 2024-07-02 DIAGNOSIS — A310 Pulmonary mycobacterial infection: Secondary | ICD-10-CM | POA: Insufficient documentation

## 2024-07-02 DIAGNOSIS — F331 Major depressive disorder, recurrent, moderate: Secondary | ICD-10-CM

## 2024-07-02 DIAGNOSIS — Z85238 Personal history of other malignant neoplasm of thymus: Secondary | ICD-10-CM | POA: Diagnosis present

## 2024-07-02 LAB — CMP (CANCER CENTER ONLY)
ALT: 18 U/L (ref 0–44)
AST: 26 U/L (ref 15–41)
Albumin: 3.8 g/dL (ref 3.5–5.0)
Alkaline Phosphatase: 158 U/L — ABNORMAL HIGH (ref 38–126)
Anion gap: 9 (ref 5–15)
BUN: 22 mg/dL (ref 8–23)
CO2: 30 mmol/L (ref 22–32)
Calcium: 9.8 mg/dL (ref 8.9–10.3)
Chloride: 102 mmol/L (ref 98–111)
Creatinine: 1.78 mg/dL — ABNORMAL HIGH (ref 0.44–1.00)
GFR, Estimated: 28 mL/min — ABNORMAL LOW (ref >60)
Glucose, Bld: 93 mg/dL (ref 70–99)
Potassium: 4.7 mmol/L (ref 3.5–5.1)
Sodium: 140 mmol/L (ref 135–145)
Total Bilirubin: 0.4 mg/dL (ref 0.0–1.2)
Total Protein: 6.6 g/dL (ref 6.5–8.1)

## 2024-07-02 LAB — CBC WITH DIFFERENTIAL (CANCER CENTER ONLY)
Abs Immature Granulocytes: 0.01 K/uL (ref 0.00–0.07)
Basophils Absolute: 0 K/uL (ref 0.0–0.1)
Basophils Relative: 1 %
Eosinophils Absolute: 0.1 K/uL (ref 0.0–0.5)
Eosinophils Relative: 2 %
HCT: 35.4 % — ABNORMAL LOW (ref 36.0–46.0)
Hemoglobin: 11.1 g/dL — ABNORMAL LOW (ref 12.0–15.0)
Immature Granulocytes: 0 %
Lymphocytes Relative: 16 %
Lymphs Abs: 0.9 K/uL (ref 0.7–4.0)
MCH: 28.7 pg (ref 26.0–34.0)
MCHC: 31.4 g/dL (ref 30.0–36.0)
MCV: 91.5 fL (ref 80.0–100.0)
Monocytes Absolute: 0.5 K/uL (ref 0.1–1.0)
Monocytes Relative: 8 %
Neutro Abs: 4.2 K/uL (ref 1.7–7.7)
Neutrophils Relative %: 73 %
Platelet Count: 234 K/uL (ref 150–400)
RBC: 3.87 MIL/uL (ref 3.87–5.11)
RDW: 14.6 % (ref 11.5–15.5)
WBC Count: 5.7 K/uL (ref 4.0–10.5)
nRBC: 0 % (ref 0.0–0.2)

## 2024-07-02 NOTE — Telephone Encounter (Signed)
 Ms. Albergo called in with her son Ozell Needles on the line.  He has questions pertaining to Sandra Craig's visit yesterday.  Her son would like to speak with someone to make sure they are all on the same page when it comes to his mother's treatment.  He is unsure why our office did an L-spine x-ray yesterday instead of a pelvis.  The visit discusses her spondylolisthesis, but not her pelvic fracture. They thought she was being seen to follow up from the fracture.  Also, physical therapy was discontinued, is that the best idea? Michael's telephone # is [phone] and you have Ms. Krutz's permission to speak with him.

## 2024-07-03 DIAGNOSIS — S32592A Other specified fracture of left pubis, initial encounter for closed fracture: Secondary | ICD-10-CM | POA: Diagnosis not present

## 2024-07-03 DIAGNOSIS — Z634 Disappearance and death of family member: Secondary | ICD-10-CM | POA: Diagnosis not present

## 2024-07-03 DIAGNOSIS — Z96641 Presence of right artificial hip joint: Secondary | ICD-10-CM | POA: Diagnosis not present

## 2024-07-05 ENCOUNTER — Telehealth: Payer: Self-pay | Admitting: Family Medicine

## 2024-07-05 ENCOUNTER — Ambulatory Visit: Payer: Self-pay

## 2024-07-05 DIAGNOSIS — A310 Pulmonary mycobacterial infection: Secondary | ICD-10-CM | POA: Diagnosis not present

## 2024-07-05 DIAGNOSIS — R918 Other nonspecific abnormal finding of lung field: Secondary | ICD-10-CM | POA: Diagnosis not present

## 2024-07-05 DIAGNOSIS — J471 Bronchiectasis with (acute) exacerbation: Secondary | ICD-10-CM | POA: Diagnosis not present

## 2024-07-05 DIAGNOSIS — J3801 Paralysis of vocal cords and larynx, unilateral: Secondary | ICD-10-CM | POA: Diagnosis not present

## 2024-07-05 NOTE — Telephone Encounter (Signed)
 Patient called Sandra Craig, triage nurse called on behalf of Patient  reporting Spasms and Back Pain. She is trying to get a muscle relaxant since the patient is seen by PT but needs orders and clearance. Sandra Craig advised emergency room and left CRM.

## 2024-07-05 NOTE — Telephone Encounter (Signed)
 Called Pt back and set her up for a phone visit with Dr McQuaid today @ 9:20 am. Pt stated understanding and nothing further is needed at this time.

## 2024-07-05 NOTE — Progress Notes (Unsigned)
 [facility] OFFICE PROGRESS NOTE  Katrinka Garnette KIDD, MD [address] 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   PRIOR THERAPY: 1) status post surgical resection of the anterior mediastinal lymph node under the care of Dr. Kerrin on 08/05/22 2) Adjuvant radiation under the care of Dr. Dewey.   CURRENT THERAPY: Observation   INTERVAL HISTORY: Sandra Craig 83 y.o. female returns to clinic today for follow-up visit.  The patient was last seen in clinic by Dr. Sherrod on 01/10/2024.  The patient has a history of malignancy and she is currently on observation.  She has a history of MAI infection.  She is followed closely by Dr. McQuaid at [facility].  She saw him recently on 07/05/2024.  Her most recent sputum culture showed MAI.  They are planning on ***.   Today she denies any fever, chills, or night sweats.  She has shortness of breath with exertion.  She uses her flutter valve.  Cough?  She denies any chest pain or hemoptysis.  She denies any nausea, vomiting, diarrhea, or constipation.  Denies any headache or visual changes.***Husband recently passed away.  She recently had a restaging CT scan.  She is here today for evaluation and to review her scan results.   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   Chronic insomnia    Chronic kidney disease    stage 4   Cystitis    Depression    Diverticulitis of intestine without perforation or abscess without bleeding    Patient did have abscess but noperforation   Diverticulosis of colon (without mention of hemorrhage)    Endometriosis    Family history of malignant neoplasm of gastrointestinal tract     Fibromyalgia    Fractures involving multiple body regions    Gastritis    GERD (gastroesophageal reflux disease)    Hiatal hernia    HIATAL HERNIA 10/08/2008   Qualifier: Diagnosis of  By: Genie CMA (AAMA), Chick     History of gallstones    HSV-1 infection    Hyperlipidemia    Hypertension    IBS (irritable bowel syndrome)    IC (interstitial cystitis)    Internal hemorrhoid    Mycobacterium avium complex (HCC)    history- took antibiotics and completed course   Non-small cell lung cancer (HCC)    in remission   Osteonecrosis (HCC)    Osteopenia    Osteoporosis    Palpitations    Polycystic kidney disease    pt denies   Pulmonary nodule 07/2007   5 mm Anterior RUL   Small bowel obstruction (HCC)    Stroke (HCC) 2021   TIA    ALLERGIES:  is allergic to zithromax  [azithromycin ] and celebrex [celecoxib].  MEDICATIONS:  Current Outpatient Medications  Medication Sig Dispense Refill   acetaminophen  (TYLENOL ) 500 MG tablet Take 1,000 mg by mouth 2 (two) times daily as needed for headache (pain).     aspirin  81 MG tablet Take 1 tablet (81 mg total) by mouth daily. 30 tablet    atorvastatin  (LIPITOR) 40 MG tablet TAKE 1 TABLET(40 MG) BY MOUTH DAILY (Patient taking differently: Take 40 mg by mouth at bedtime.) 90 tablet  3   Calcium  Carb-Cholecalciferol (CALCIUM  + VITAMIN D3 PO) Take 1 tablet by mouth 2 (two) times daily.     DULoxetine  (CYMBALTA ) 30 MG capsule TAKE 2 CAPSULE BY MOUTH EVERY MORNING AND 1 CAPSULE BY MOUTH EVERY EVENING (Patient taking differently: Take 30 mg by mouth 2 (two) times daily.) 270 capsule 3   famotidine  (PEPCID ) 20 MG tablet Take 1 tablet (20 mg total) by mouth 2 (two) times daily. 180 tablet 1   gabapentin  (NEURONTIN ) 300 MG capsule TAKE 1 CAPSULE(300 MG) BY MOUTH TWICE DAILY AS NEEDED FOR PAIN 180 capsule 3   HYDROcodone -acetaminophen  (NORCO/VICODIN) 5-325 MG tablet Take 1 tablet by mouth 2 (two) times daily as needed.     losartan  (COZAAR ) 25 MG  tablet Take 12.5 mg by mouth daily.     Multiple Vitamins-Minerals (MULTIVITAMIN WOMEN 50+) TABS Take 1 tablet by mouth 2 (two) times daily.     Polyethyl Glycol-Propyl Glycol (SYSTANE OP) Place 1 drop into both eyes 2 (two) times daily as needed (eye irritation).     sucralfate  (CARAFATE ) 1 g tablet Take 1 tablet (1 g total) by mouth 4 (four) times daily. Dissolve each tablet in 15 cc water  before use. (Patient taking differently: Take 1 g by mouth 4 (four) times daily as needed (somach irritation).) 120 tablet 2   zolpidem  (AMBIEN ) 5 MG tablet TAKE 1 TABLET BY MOUTH EVERY DAY AT BEDTIME FOR SLEEP 90 tablet 1   Current Facility-Administered Medications  Medication Dose Route Frequency Provider Last Rate Last Admin   Romosozumab -aqqg (EVENITY ) 105 MG/1. injection 210 mg  210 mg Subcutaneous Once         SURGICAL HISTORY:  Past Surgical History:  Procedure Laterality Date   ABDOMINAL HYSTERECTOMY  08/01/1992   TAH,BSO FOR ENDOMETRIOSIS   ABDOMINAL SURGERY  08/01/2009   small intestine blockage   CHOLECYSTECTOMY     GASTROPLASTY  08/01/2009   small bowel resection -open   INGUINAL HERNIA REPAIR Right 02/28/2020   Procedure: LAPAROSCOPIC RIGHT INGUINAL HERNIA REPAIR WITH MESH;  Surgeon: Rubin Calamity, MD;  Location[facility];  Service: General;  Laterality: Right;   JOINT REPLACEMENT  08/01/2006   OOPHORECTOMY  08/01/1992   TAH,BSO   PELVIC LAPAROSCOPY     S/P right shoulder rotater cuff  200216/2011   Tear/adhesive capsulitis   SBO Lap  08/01/2009   Adhesions and small internal hernia   SHOULDER HEMI-ARTHROPLASTY  06/05/2012   Procedure: SHOULDER HEMI-ARTHROPLASTY;  Surgeon: Fonda SHAUNNA Olmsted, MD;  Location: [facility] OR;  Service: Orthopedics;  Laterality: Right;  FOR ARTHRITIS   SMALL INTESTINE SURGERY     TOTAL HIP ARTHROPLASTY  FALL OF 2008   rt. partial hip replacement   TOTAL SHOULDER ARTHROPLASTY  06/05/2012   Procedure: TOTAL SHOULDER ARTHROPLASTY;  Surgeon: Fonda SHAUNNA Olmsted, MD;   Location: [facility] OR;  Service: Orthopedics;  Laterality: Right;  RIGHT SHOULDER TOTAL ARTHROPLASTY, HEMIARTHROPLASTY, SHOULDER, FOR ARTHRITIS   VIDEO BRONCHOSCOPY Bilateral 01/28/2015   Procedure: VIDEO BRONCHOSCOPY WITH FLUORO;  Surgeon: Lamar GORMAN Chris, MD;  Location: [facility] ENDOSCOPY;  Service: Cardiopulmonary;  Laterality: Bilateral;   VIDEO BRONCHOSCOPY Bilateral 04/23/2019   Procedure: VIDEO BRONCHOSCOPY WITHOUT FLUORO;  Surgeon: Chris Lamar GORMAN, MD;  Location: [facility] ENDOSCOPY;  Service: Cardiopulmonary;  Laterality: Bilateral;    REVIEW OF SYSTEMS:   Review of Systems  Constitutional: Negative for appetite change, chills, fatigue, fever and unexpected weight change.  HENT:   Negative for mouth sores, nosebleeds, sore throat and trouble swallowing.   Eyes:  Negative for eye problems and icterus.  Respiratory: Negative for cough, hemoptysis, shortness of breath and wheezing.   Cardiovascular: Negative for chest pain and leg swelling.  Gastrointestinal: Negative for abdominal pain, constipation, diarrhea, nausea and vomiting.  Genitourinary: Negative for bladder incontinence, difficulty urinating, dysuria, frequency and hematuria.   Musculoskeletal: Negative for back pain, gait problem, neck pain and neck stiffness.  Skin: Negative for itching and rash.  Neurological: Negative for dizziness, extremity weakness, gait problem, headaches, light-headedness and seizures.  Hematological: Negative for adenopathy. Does not bruise/bleed easily.  Psychiatric/Behavioral: Negative for confusion, depression and sleep disturbance. The patient is not nervous/anxious.     PHYSICAL EXAMINATION:  Last menstrual period 08/25/1992.  ECOG PERFORMANCE STATUS: {CHL ONC ECOG [id number]  Physical Exam  Constitutional: Oriented to person, place, and time and well-developed, well-nourished, and in no distress. No distress.  HENT:  Head: Normocephalic and atraumatic.  Mouth/Throat: Oropharynx is clear and moist. No  oropharyngeal exudate.  Eyes: Conjunctivae are normal. Right eye exhibits no discharge. Left eye exhibits no discharge. No scleral icterus.  Neck: Normal range of motion. Neck supple.  Cardiovascular: Normal rate, regular rhythm, normal heart sounds and intact distal pulses.   Pulmonary/Chest: Effort normal and breath sounds normal. No respiratory distress. No wheezes. No rales.  Abdominal: Soft. Bowel sounds are normal. Exhibits no distension and no mass. There is no tenderness.  Musculoskeletal: Normal range of motion. Exhibits no edema.  Lymphadenopathy:    No cervical adenopathy.  Neurological: Alert and oriented to person, place, and time. Exhibits normal muscle tone. Gait normal. Coordination normal.  Skin: Skin is warm and dry. No rash noted. Not diaphoretic. No erythema. No pallor.  Psychiatric: Mood, memory and judgment normal.  Vitals reviewed.  LABORATORY DATA: Lab Results  Component Value Date   WBC 5.7 07/02/2024   HGB 11.1 (L) 07/02/2024   HCT 35.4 (L) 07/02/2024   MCV 91.5 07/02/2024   PLT 234 07/02/2024      Chemistry      Component Value Date/Time   NA 140 07/02/2024 0932   NA 140 02/10/2022 0000   K 4.7 07/02/2024 0932   CL 102 07/02/2024 0932   CO2 30 07/02/2024 0932   BUN 22 07/02/2024 0932   BUN 24 (A) 02/10/2022 0000   CREATININE 1.78 (H) 07/02/2024 0932   CREATININE 1.98 (H) 01/16/2024 1532   GLU 90 02/10/2022 0000      Component Value Date/Time   CALCIUM  9.8 07/02/2024 0932   CALCIUM  10.3 12/21/2009 2250   ALKPHOS 158 (H) 07/02/2024 0932   AST 26 07/02/2024 0932   ALT 18 07/02/2024 0932   BILITOT 0.4 07/02/2024 0932       RADIOGRAPHIC STUDIES:  XR Lumbar Spine Complete Result Date: 07/01/2024 XRs of the lumbar spine from 07/01/2024 were independently reviewed and read, showing spondylolisthesis at L4/5 and L5/S1.  Disc height loss at L5/S1.  No other significant degenerative changes seen.  No fracture or dislocation  seen.    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  She is status post surgical resection of the anterior mediastinal lymph node under the care of Dr. Kerrin on 08/05/22.  She also underwent adjuvant radiotherapy under the care of Dr. Dewey completed in February 2024. She is currently on observation and feeling fine.  The patient was seen with Dr. Sherrod today.  Dr. Sherrod personally and independently reviewed the scan and discussed results with the patient today.  The scan showed ***.  Dr. Sherrod recommends ***  Dr. Sherrod recommends that she ***  I will arrange for restaging CT scan of the chest in approximately ***  She will continue to follow with pulmonary medicine regarding her MAI infection.  The patient was advised to call immediately if she has any concerning symptoms in the interval. The patient voices understanding of current disease status and treatment options and is in agreement with the current care plan. All questions were answered. The patient knows to call the clinic with any problems, questions or concerns. We can certainly see the patient much sooner if necessary   No orders of the defined types were placed in this encounter.    I spent {CHL ONC TIME VISIT - DTPQU:[id number]} counseling the patient face to face. The total time spent in the appointment was {CHL ONC TIME VISIT - DTPQU:[id number]}.  Isai Gottlieb L Pranit Owensby, PA-C 07/05/24

## 2024-07-05 NOTE — Telephone Encounter (Signed)
 It looks like they were trying to get her in for an office visit.  I am okay just prescribing a medicine but I have some concerns about the muscle relaxants.  She is incredibly high fall risk and she would need someone with her likely 6 to 8 hours after taking this to make sure she is steady-would she had access to that?  If not I would not recommend the medicines

## 2024-07-05 NOTE — Telephone Encounter (Signed)
 Please call patient to schedule OV. Tks

## 2024-07-05 NOTE — Progress Notes (Signed)
 [facility] Pulmonary and Critical Care Medicine   Patient Name: Sandra Craig, 1940-10-27  Synopsis: Referred in 06/2023 for pulmonary nodules, has a history of thymic carcinoma and squamous cell carcinoma of the lung.   Has vocal cord paralysis (partial).   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  She was initially followed for a pulmonary nodule in 2008. She used to smoke and quit in 1977 after ten years.  < 1 ppd.  She was diagnosed with a bronchoscopy in June 2016. A CT scan in 2016 showed characteristic tree in bud abnormalities which were worrisome for Mycobacterium avium intercellular. She started 3 drug therapy in August 2016, but had to stop azithromycin  after 2 weeks because of a rash. Started clarithomycin.   CC:  No chief complaint on file.   HPI:  Location Information: Patient State (at time of visit): [place]  Patient Location (at time of visit):Home/Other Non-Medical  Provider Location: Non-Provider-Based Clinic (Clinic, non-hospital) Is provider licensed to provide clinical care in the current location/state of the patient? Yes   Consent:  Patient's identity was confirmed. Presenting condition or illness was discussed with the patient/personal representative. Current proposed treatment for presenting condition or illness was explained to patient/personal representative along with the likely benefits and any significant risks or complications associated with the provision of treatment by audio/video means. The patient/personal representative verbally authorized treatment to be provided by audio/video, which may include a limited review of patient's current health status, medication, or other treatment recommendations, patient education, and an opportunity to ask questions about condition and treatment. Verbal Consent Granted by Patient/Personal Representative:Yes   Visit  Information: Modality: Audio-Only  Time Spent on Phone w/ Patient: 12 minutes   History of Present Illness The patient is an 83 year old female who presents for a virtual visit with a significant medical history.  Thymic Carcinoma - History of thymic carcinoma.  Squamous Carcinoma of the Lung - History of squamous carcinoma of the lung.  Multiple Pulmonary Nodules - History of multiple pulmonary nodules.  Mycobacterium Avium-Intracellulare (MAI) - History of Mycobacterium avium-intracellulare (MAI).  Weight Loss - Reports a 10-pound weight loss attributed to stress from her husband's illness. - Denies fevers or chills. - Symptom relief achieved with flutter valve.  Additional Information - CT scan performed on 07/02/2024 by Dr. Gatha; report pending. - Follow-up with Dr. Nanda PA scheduled for 07/10/2024.  SOCIAL HISTORY Marital Status: Widowed   +weight loss> 10 pounds +improvement in mucus burden with flutter valve No fever, chills   Past Medical History:  Diagnosis Date  . HTN (hypertension)   . Thymus cancer    (CMD)    Past Surgical History:  Procedure Laterality Date  . GALLBLADDER SURGERY    . SMALL INTESTINE SURGERY    . THYMECTOMY       Exam: There were no vitals filed for this visit.  Voice strong, coherent  STUDIES Imaging: 12/30/2022 CT chest > decreasing mediastinal adenopathy of lymph nodes, decreased left effusion, tree-in-bud nodularity with broncheictasis in upper lobes and medial left lower lobe July 03, 2023 CT chest waxing and waning peribronchovascular nodularity upper lobes, favor atypical infection, possibly chronic atypical mycobacterial infection, postsurgical changes no findings suspicious for recurrent disease- June 2024 CT chest from [facility]: Previously observed nodularity left upper lobe and right upper lobe are improved, likely postinfectious, postradiation changes unchanged, new subtle nodule right lower lobe 4  mm 01/01/2024 CT chest:  05/15/2024 CT  chest >     Labs:   Path: Aug 07, 2022  A. MEDIASTINAL, ANTERIOR, RESECTION:  - Poorly differentiated squamous cell carcinoma (see comment)   COMMENT:   - Lymphoid tissue is noted in the periphery of the mass, and this could  possibly represent a mediastinal lymph node with metastatic poorly  differentiated squamous cell carcinoma. The tumor shows extensive  necrosis and a single area suspicious for extracapsular extension is  also noted. Appropriately controlled immunohistochemical stains show the  tumor cells are positive for CK5/6 and p40.  This case was reviewed with  Dr. Rebbecca who agrees with the above interpretation.   Micro: 05/13/2024 Sputum AFB > heavy growth of MAI 05/13/2024 Sputum Fungus > neg 05/13/2024 Sputum  PFT: June 26, 2023 simple spirometry ratio 76%, FVC 2.5 L 90% predicted  Echo:   Heart Cath:   Sleep study:   Ambulatory oximetry:   Diagnoses and all orders for this visit: Pulmonary MAI (mycobacterium avium-intracellulare) infection    (CMD) Bronchiectasis with acute exacerbation    (CMD) Vocal fold paralysis, unilateral Multiple pulmonary nodules Thymic carcinoma    (CMD)       Impression: Dagoberto and I had a phone visit today to discuss the findings from her most recent CT chest.  Unfortunately I cannot see the results of the CT chest that was performed this week however, in October she had a CT chest performed which showed progression of focal nodularity strongly suggestive of MAI.  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  She grew heavy growth MAI from our most recent sputum cultures.  Fortunately, with use of a flutter valve her symptoms of bronchiectasis are well-controlled and she describes no other symptoms  worrisome for active MAI infection other than weight loss which may be related to grief over the recent death of her husband.  Plan: Bronchiectasis: Continue flutter valve twice daily  Pulmonary MAI: I will discuss the findings of the most recent CT chest with Dr. Sherrod, if there are findings worrisome for lung destruction and related to MAI we will likely suggest treatment for MAI  Multiple pulmonary nodules with history of stage III thymic/squamous cell carcinoma of the lung: As above  > 20 minutes spent on this visit, 12 minutes with patient  Current Outpatient Medications  Medication Instructions  . aspirin  81 mg, Daily  . atorvastatin  (LIPITOR) 40 mg, Daily  . calcium  citrate malate-vit D3 250 mg-2.5 mcg (100 unit) tab 1 tablet, 2 times daily  . DULoxetine  (CYMBALTA ) 30 mg capsule 1 capsule, 2 times daily  . famotidine  (PEPCID ) 20 mg  . gabapentin  (NEURONTIN ) 300 mg capsule   . losartan  (COZAAR ) 25 mg tablet   . multivitamin with minerals tab 1 tablet, Daily  . peg 400-propylene glycol (SYSTANE) 0.4-0.3 % drpg ophthalmic gel Administer into affected eye(s).  . sucralfate  (CARAFATE ) 1 g  . zolpidem  (AMBIEN ) 5 mg tablet     There is no immunization history on file for this patient.  Thresa Lennert MD [facility] Pulmonary and Critical Care  Mobile [phone] Office: [phone]

## 2024-07-05 NOTE — Telephone Encounter (Signed)
 FYI Only or Action Required?: FYI only for provider: ED advised.- refused  Patient was last seen in primary care on 06/25/2024 by Katrinka Garnette KIDD, MD.  Called Nurse Triage reporting Spasms and Back Pain.  Symptoms began yesterday.  Interventions attempted: Ice/heat application.  Symptoms are: unchanged.  Triage Disposition: Go to ED Now (Notify PCP)  Patient/caregiver understands and will follow disposition?: No, refuses disposition    Copied from CRM [id number]. Topic: Clinical - Red Word Triage >> Jul 05, 2024  4:19 PM Jasmin G wrote: Red Word that prompted transfer to Nurse Triage: Pt requested to speak to NT regarding the need of a muscle relaxer due to experiencing back and leg spasms. Reason for Disposition  [1] SEVERE back pain (e.g., excruciating) AND [2] sudden onset AND [3] age > 60 years    Severe pain and over 60, unable to bear weight due to pain  Answer Assessment - Initial Assessment Questions Pt has fractured pelvis, PT is currently there working with patient but she is unable to workout due to pain and muscle spasms. Pt states she thinks she did something to her right back and leg yesterday but unsure what. Pain is 10/10 on the right side and she is unable to bear weight due to the pain. Physical therapist says she just needs a muscle relaxer to help her. Her bp is also elevated. She has not had any new falls. Rn advised pt due to pain and inability to bear weight, recommendation is for pt to go to the ER. She states she is not doing that and the physical therapist said she just needs the medication. RN called [facility]. They had not clinical staff for RN to speak to, reception did speak with someone who stated they would not be able to get a medication that has not been prescribed for the pt before approved today and to advise what RN thinks is best for patient. Rn got back on the phone with the patient and advised her of this. She states she just needs the medication.       1. ONSET: When did the pain begin? (e.g., minutes, hours, days)     yesterday 2. LOCATION: Where does it hurt? (upper, mid or lower back)     Right back and leg 3. SEVERITY: How bad is the pain?  (e.g., Scale 1-10; mild, moderate, or severe)     10/10 4. PATTERN: Is the pain constant? (e.g., yes, no; constant, intermittent)      constant 5. RADIATION: Does the pain shoot into your legs or somewhere else?     Down leg 6. CAUSE:  What do you think is causing the back pain?      unknown 7. BACK OVERUSE:  Any recent lifting of heavy objects, strenuous work or exercise?     no 8. NEUROLOGIC SYMPTOMS: Do you have any weakness, numbness, or problems with bowel/bladder control?     denies  Protocols used: Back Pain-A-AH

## 2024-07-05 NOTE — Telephone Encounter (Signed)
 Please review and advise. Tks

## 2024-07-08 ENCOUNTER — Telehealth: Payer: Self-pay | Admitting: Orthopedic Surgery

## 2024-07-08 ENCOUNTER — Ambulatory Visit: Admitting: Family

## 2024-07-08 NOTE — Telephone Encounter (Signed)
 Pt called and said she received the messages and she said now its a pain in her hip and she said could you call her. She wants to know do she needs to let her doctor know this afternoon when she visit him. She can lift that leg because of the pain. CB#[phone]

## 2024-07-08 NOTE — Telephone Encounter (Signed)
 Spoke with patient. They cannot drive but will try to find a ride then call us  back to schedule.

## 2024-07-08 NOTE — Telephone Encounter (Signed)
 I called, pt is c/o right hip pain. Trouble with it getting a catch/spasm that will throw her to the floor. This is a hip replacement that was done Dr. Jane. Pt worked in for 07/11/24 @ 215

## 2024-07-10 ENCOUNTER — Ambulatory Visit: Admitting: Internal Medicine

## 2024-07-10 ENCOUNTER — Inpatient Hospital Stay: Admitting: Physician Assistant

## 2024-07-10 ENCOUNTER — Other Ambulatory Visit: Payer: Self-pay | Admitting: Family Medicine

## 2024-07-10 ENCOUNTER — Ambulatory Visit: Admitting: Orthopedic Surgery

## 2024-07-10 VITALS — BP 132/76 | HR 98 | Temp 98.3°F | Resp 16 | Wt 135.9 lb

## 2024-07-10 DIAGNOSIS — C37 Malignant neoplasm of thymus: Secondary | ICD-10-CM | POA: Diagnosis not present

## 2024-07-10 DIAGNOSIS — Z85238 Personal history of other malignant neoplasm of thymus: Secondary | ICD-10-CM | POA: Diagnosis not present

## 2024-07-10 DIAGNOSIS — C3490 Malignant neoplasm of unspecified part of unspecified bronchus or lung: Secondary | ICD-10-CM | POA: Diagnosis not present

## 2024-07-11 ENCOUNTER — Other Ambulatory Visit: Payer: Self-pay

## 2024-07-11 ENCOUNTER — Ambulatory Visit (INDEPENDENT_AMBULATORY_CARE_PROVIDER_SITE_OTHER): Admitting: Orthopedic Surgery

## 2024-07-11 ENCOUNTER — Telehealth: Payer: Self-pay | Admitting: Internal Medicine

## 2024-07-11 DIAGNOSIS — M25551 Pain in right hip: Secondary | ICD-10-CM | POA: Diagnosis not present

## 2024-07-11 MED ORDER — OXYCODONE HCL 5 MG PO TABS: 5.0000 mg | ORAL_TABLET | ORAL | 0 refills | Status: AC | PRN

## 2024-07-11 MED ORDER — METHYLPREDNISOLONE 4 MG PO TBPK: ORAL_TABLET | ORAL | 0 refills | Status: AC

## 2024-07-11 NOTE — Telephone Encounter (Signed)
 Scheduled patient for labs and follow-up in 6 months. Called and spoke with the patient, she is aware of the days and times.

## 2024-07-12 ENCOUNTER — Telehealth: Payer: Self-pay | Admitting: Orthopedic Surgery

## 2024-07-12 NOTE — Progress Notes (Unsigned)
 rthopedic Surgery Office Note   Injury: pelvic ring injury Date of injury: 05/24/2024 (~5 weeks from injury)    Patient is a 83 year old female who had a ground-level fall and was admitted to [facility] long hospital at the end of October.  She was diagnosed with a pelvic ring injury.  I was consulted at that time but she had extensive history with [facility] and wanted to follow-up with them.  She ended up not following up with them.  She comes in today with persistent pain in the left groin region.  She notes that when she is ambulating without her walker.  She does not have much pain when she is walking with her walker.  She has been using Tylenol  to control her pain.  She notes the pain when she is working with physical therapy.  The leg raises and single-leg marching has been painful.  She does not have any pain radiating down the leg.  She is not having any low back pain.  Her husband recently died and that has complicated things.  She is now living alone.   On exam, she is in no acute distress.  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  Disc height loss at L5/S1.  No other significant degenerative changes seen.  No fracture or dislocation seen.     Plan: -No operative plans at this time -Weight bearing as tolerated -Pain management: tylenol  as needed -Should continue to ambulate, discontinue PT at this time as it is only aggravating pain -Explained that most of these fractures do go on to heal with time.  She is still fairly early from a fracture healing standpoint, we will continue to monitor -Follow up in 4 weeks, x-rays at next visit: AP/inlet/outlet pelvis      Ozell DELENA Ada, MD Orthopedic Surgeon

## 2024-07-12 NOTE — Telephone Encounter (Signed)
 Pt called and ask if you could send her the after visit summary for 07/11/2024. If you could email it please. CB#[phone]

## 2024-07-15 ENCOUNTER — Telehealth: Payer: Self-pay | Admitting: Orthopedic Surgery

## 2024-07-15 NOTE — Telephone Encounter (Signed)
 Pt called and said she would like if you could call her. Her hip has a lot of pain and need to know what to do before she comes to see him. CB#[phone]

## 2024-07-15 NOTE — Telephone Encounter (Signed)
 I called and Lmom that she needs to take meds as rx'd and that she can do heat over the area that hurts

## 2024-07-15 NOTE — Telephone Encounter (Signed)
 Pt called saying that you are sending her messages and she's not good with receiving them. She wants you to call her back. Call back number is [phone]

## 2024-07-16 ENCOUNTER — Telehealth: Payer: Self-pay | Admitting: Orthopedic Surgery

## 2024-07-16 NOTE — Telephone Encounter (Signed)
 Pt called and wanted to respond to your message. And wants you to call herr back. She didn't say why. Call back number is [phone].

## 2024-07-18 ENCOUNTER — Other Ambulatory Visit: Payer: Self-pay | Admitting: Physician Assistant

## 2024-07-18 ENCOUNTER — Telehealth: Payer: Self-pay | Admitting: Orthopedic Surgery

## 2024-07-18 DIAGNOSIS — M533 Sacrococcygeal disorders, not elsewhere classified: Secondary | ICD-10-CM

## 2024-07-19 ENCOUNTER — Telehealth: Payer: Self-pay | Admitting: Family Medicine

## 2024-07-19 NOTE — Telephone Encounter (Signed)
 If she feels well and asymptomatic I'm ok with OT

## 2024-07-19 NOTE — Telephone Encounter (Signed)
 Please see patient message and advise.   Copied from CRM [id number]. Topic: Clinical - Home Health Verbal Orders >> Jul 19, 2024 12:12 PM Robinson DEL wrote: Caller/Agency: [facility] Callback Number: [phone] Service Requested: Occupational Therapy Frequency: 1 week 6 effective today Any new concerns about the patient? Yes, Patient states she's on hold for PT and wants to know if OT needs to be put on hold until appointment next week. Her BP and heart rate a little high today 107 heart rate, blood pressure 156/104 asymptomatic, hadn't taken blood pressure medication yet and took at session, patient was educated to recheck pressure after session.

## 2024-07-22 ENCOUNTER — Ambulatory Visit
Admission: RE | Admit: 2024-07-22 | Discharge: 2024-07-22 | Disposition: A | Discharge status: Home / Self Care | Source: Ambulatory Visit | Attending: Physician Assistant | Admitting: Physician Assistant

## 2024-07-22 DIAGNOSIS — M533 Sacrococcygeal disorders, not elsewhere classified: Secondary | ICD-10-CM

## 2024-07-30 ENCOUNTER — Other Ambulatory Visit: Payer: Self-pay | Admitting: Physician Assistant

## 2024-07-30 DIAGNOSIS — M8448XG Pathological fracture, other site, subsequent encounter for fracture with delayed healing: Secondary | ICD-10-CM

## 2024-07-31 ENCOUNTER — Ambulatory Visit: Admitting: Orthopedic Surgery

## 2024-08-01 NOTE — Progress Notes (Shared)
 "     Chief Complaint: Patient was seen in consultation today for sacral insufficiency fracture  Referring Physician(s): Chadwell,Joshua  History of Present Illness: Sandra Craig is an 84 y.o. female with a medical history significant for CKD IV, depression/anxiety, HTN, IBS, interstitial cystitis, fibromyalgia, diverticulitis, SBO, stroke and osteoporosis. She was admitted to [facility] 05/24/24 after she sustained a fall and experienced severe hip pain. Pelvic x-ray and CT left hip showed nondisplaced left superior and inferior rami fractures with possible involvement of the medial acetabulum. She was evaluated by Orthopedic surgery with recommendations for conservative management. She was treated with pain medication and physical therapy before being discharged to [facility] 05/28/24.   She has been attending outpatient physical therapy and has maintained close follow up with her Orthopedic team. She continues to complain of significant pain and several additional imaging studies were performed including an MRI of the lumbar spine 07/27/24. This showed an acute/recent sacral insufficiency fracture.   She has now been referred to Interventional Radiology to discuss potential treatment options including sacroplasty.   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   Chronic insomnia    Chronic kidney disease    stage 4   Cystitis    Depression    Diverticulitis of intestine without perforation or abscess without bleeding    Patient did have abscess but noperforation   Diverticulosis of colon (without mention of hemorrhage)    Endometriosis    Family history of malignant neoplasm of gastrointestinal tract    Fibromyalgia    Fractures involving multiple body regions    Gastritis    GERD (gastroesophageal reflux disease)    Hiatal hernia    HIATAL HERNIA 10/08/2008   Qualifier: Diagnosis of  By: Genie CMA (AAMA),  Chick     History of gallstones    HSV-1 infection    Hyperlipidemia    Hypertension    IBS (irritable bowel syndrome)    IC (interstitial cystitis)    Internal hemorrhoid    Mycobacterium avium complex (HCC)    history- took antibiotics and completed course   Non-small cell lung cancer (HCC)    in remission   Osteonecrosis (HCC)    Osteopenia    Osteoporosis    Palpitations    Polycystic kidney disease    pt denies   Pulmonary nodule 07/2007   5 mm Anterior RUL   Small bowel obstruction (HCC)    Stroke (HCC) 2021   TIA    Past Surgical History:  Procedure Laterality Date   ABDOMINAL HYSTERECTOMY  08/01/1992   TAH,BSO FOR ENDOMETRIOSIS   ABDOMINAL SURGERY  08/01/2009   small intestine blockage   CHOLECYSTECTOMY     GASTROPLASTY  08/01/2009   small bowel resection -open   INGUINAL HERNIA REPAIR Right 02/28/2020   Procedure: LAPAROSCOPIC RIGHT INGUINAL HERNIA REPAIR WITH MESH;  Surgeon: Rubin Calamity, MD;  Location: [facility] OR;  Service: General;  Laterality: Right;   JOINT REPLACEMENT  08/01/2006   OOPHORECTOMY  08/01/1992   TAH,BSO   PELVIC LAPAROSCOPY     S/P right shoulder rotater cuff  200216/2011   Tear/adhesive capsulitis   SBO Lap  08/01/2009   Adhesions and small internal hernia   SHOULDER HEMI-ARTHROPLASTY  06/05/2012   Procedure: SHOULDER HEMI-ARTHROPLASTY;  Surgeon: Fonda SHAUNNA Olmsted, MD;  Location: [facility] OR;  Service: Orthopedics;  Laterality: Right;  FOR ARTHRITIS   SMALL INTESTINE SURGERY  TOTAL HIP ARTHROPLASTY  FALL OF 2008   rt. partial hip replacement   TOTAL SHOULDER ARTHROPLASTY  06/05/2012   Procedure: TOTAL SHOULDER ARTHROPLASTY;  Surgeon: Fonda SHAUNNA Olmsted, MD;  Location: [facility] OR;  Service: Orthopedics;  Laterality: Right;  RIGHT SHOULDER TOTAL ARTHROPLASTY, HEMIARTHROPLASTY, SHOULDER, FOR ARTHRITIS   VIDEO BRONCHOSCOPY Bilateral 01/28/2015   Procedure: VIDEO BRONCHOSCOPY WITH FLUORO;  Surgeon: Lamar GORMAN Chris, MD;  Location: [facility] ENDOSCOPY;  Service:  Cardiopulmonary;  Laterality: Bilateral;   VIDEO BRONCHOSCOPY Bilateral 04/23/2019   Procedure: VIDEO BRONCHOSCOPY WITHOUT FLUORO;  Surgeon: Chris Lamar GORMAN, MD;  Location: [facility] ENDOSCOPY;  Service: Cardiopulmonary;  Laterality: Bilateral;    Allergies: Zithromax  [azithromycin ] and Celebrex [celecoxib]  Medications: Prior to Admission medications  Medication Sig Start Date End Date Taking? Authorizing Provider  acetaminophen  (TYLENOL ) 500 MG tablet Take 1,000 mg by mouth 2 (two) times daily as needed for headache (pain).    [provider]  aspirin  81 MG tablet Take 1 tablet (81 mg total) by mouth daily. 01/23/18   Whitfield Raisin, NP  atorvastatin  (LIPITOR) 40 MG tablet TAKE 1 TABLET(40 MG) BY MOUTH DAILY Patient taking differently: Take 40 mg by mouth at bedtime. 02/19/24   Katrinka Garnette KIDD, MD  Calcium  Carb-Cholecalciferol (CALCIUM  + VITAMIN D3 PO) Take 1 tablet by mouth 2 (two) times daily.    [provider]  DULoxetine  (CYMBALTA ) 30 MG capsule TAKE 2 CAPSULE BY MOUTH EVERY MORNING AND 1 CAPSULE BY MOUTH EVERY EVENING 07/10/24   Katrinka Garnette KIDD, MD  famotidine  (PEPCID ) 20 MG tablet Take 1 tablet (20 mg total) by mouth 2 (two) times daily. 06/25/24   Katrinka Garnette KIDD, MD  gabapentin  (NEURONTIN ) 300 MG capsule TAKE 1 CAPSULE(300 MG) BY MOUTH TWICE DAILY AS NEEDED FOR PAIN 04/16/24   Katrinka Garnette KIDD, MD  losartan  (COZAAR ) 25 MG tablet Take 12.5 mg by mouth daily. 02/10/22   [provider]  methylPREDNISolone  (MEDROL  DOSEPAK) 4 MG TBPK tablet Take as prescribed on the box 07/11/24   Georgina Ozell LABOR, MD  Multiple Vitamins-Minerals (MULTIVITAMIN WOMEN 50+) TABS Take 1 tablet by mouth 2 (two) times daily.    [provider]  Polyethyl Glycol-Propyl Glycol (SYSTANE OP) Place 1 drop into both eyes 2 (two) times daily as needed (eye irritation).    [provider]  sucralfate  (CARAFATE ) 1 g tablet Take 1 tablet (1 g total) by mouth 4 (four) times daily.  Dissolve each tablet in 15 cc water  before use. Patient taking differently: Take 1 g by mouth 4 (four) times daily as needed (somach irritation). 09/29/22   Dewey Rush, MD  zolpidem  (AMBIEN ) 5 MG tablet TAKE 1 TABLET BY MOUTH EVERY DAY AT BEDTIME FOR SLEEP 01/05/24   Katrinka Garnette KIDD, MD     Family History  Problem Relation Age of Onset   Heart disease Mother        MI at age 64   Emphysema Mother    Thyroid  disease Mother        Thyroidectomy/Benign   COPD Father    Pancreatic cancer Sister        died 59   Cancer Sister    Cancer Brother        adenocarcinoma right lung   COPD Brother    Lymphoma Maternal Grandmother    Colon cancer Paternal Grandmother    Heart attack Paternal Grandfather     Social History   Socioeconomic History   Marital status: Married    Spouse name:  Not on file   Number of children: 0   Years of education: Not on file   Highest education level: 12th grade  Occupational History   Occupation: Retired    Comment: Actuary  Tobacco Use   Smoking status: Former    Current packs/day: 0.00    Average packs/day: 0.8 packs/day for 8.0 years (6.0 ttl pk-yrs)    Types: Cigarettes    Start date: 08/01/1968    Quit date: 08/01/1976    Years since quitting: 48.0   Smokeless tobacco: Never   Tobacco comments:    Quit in 1978  Vaping Use   Vaping status: Never Used  Substance and Sexual Activity   Alcohol use: No    Alcohol/week: 0.0 standard drinks of alcohol   Drug use: No   Sexual activity: Not Currently    Partners: Male    Birth control/protection: Surgical    Comment: hysterectomy, older than 16, less than 5  Other Topics Concern   Not on file  Social History Narrative   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 Married to Long term boyfriend 2 years prior 2021. Prior Widowed.       Enjoys reading    Social Drivers of Health   Tobacco Use: Medium Risk (06/25/2024)   Patient History    Smoking  Tobacco Use: Former    Smokeless Tobacco Use: Never    Passive Exposure: Not on file  Financial Resource Strain: Low Risk (05/14/2024)   Overall Financial Resource Strain (CARDIA)    Difficulty of Paying Living Expenses: Not hard at all  Food Insecurity: No Food Insecurity (05/24/2024)   Epic    Worried About Programme Researcher, Broadcasting/film/video in the Last Year: Never true    Ran Out of Food in the Last Year: Never true  Transportation Needs: No Transportation Needs (05/24/2024)   Epic    Lack of Transportation (Medical): No    Lack of Transportation (Non-Medical): No  Physical Activity: Unknown (05/14/2024)   Exercise Vital Sign    Days of Exercise per Week: Patient declined    Minutes of Exercise per Session: Not on file  Stress: Stress Concern Present (05/14/2024)   [employer] of Occupational Health - Occupational Stress Questionnaire    Feeling of Stress: To some extent  Social Connections: Socially Integrated (05/24/2024)   Social Connection and Isolation Panel    Frequency of Communication with Friends and Family: Three times a week    Frequency of Social Gatherings with Friends and Family: Twice a week    Attends Religious Services: More than 4 times per year    Active Member of Clubs or Organizations: Yes    Attends Banker Meetings: More than 4 times per year    Marital Status: Married  Depression (PHQ2-9): Low Risk (06/25/2024)   Depression (PHQ2-9)    PHQ-2 Score: 0  Alcohol Screen: Low Risk (01/10/2024)   Alcohol Screen    Last Alcohol Screening Score (AUDIT): 0  Housing: Low Risk (05/24/2024)   Epic    Unable to Pay for Housing in the Last Year: No    Number of Times Moved in the Last Year: 0    Homeless in the Last Year: No  Utilities: Not At Risk (05/24/2024)   Epic    Threatened with loss of utilities: No  Health Literacy: Adequate Health Literacy (01/10/2024)   B1300 Health Literacy    Frequency of need for help with medical instructions: Never  Review of Systems: A 12 point ROS discussed and pertinent positives are indicated in the HPI above.  All other systems are negative.  Review of Systems  Vital Signs: LMP 08/25/1992   Advance Care Plan: The advanced care plan/surrogate decision maker was discussed at the time of visit and documented in the medical record.    Physical Exam  Imaging:   MR Lumbar Spine 07/27/24 IMPRESSION: 1. Partially imaged acute/recent bilateral sacral insufficiency fractures. 2. Transitional lumbosacral anatomy. For the purposes of this dictation, L5 is partially sacralized. 3. At L4-L5, severe facet arthropathy, grade 1 anterolisthesis and moderate to severe left and moderate right subarticular recess stenosis. Mild bilateral foraminal stenosis. 4. At L3-L4, mild to moderate canal stenosis and mild bilateral foraminal stenosis. 5. At L2-L3, mild canal stenosis    Labs:  CBC: Recent Labs    05/26/24 0744 05/27/24 0317 06/25/24 1227 07/02/24 0932  WBC 6.4 6.6 5.1 5.7  HGB 10.8* 10.6* 11.0* 11.1*  HCT 36.8 34.9* 33.7* 35.4*  PLT 170 161 198.0 234    COAGS: No results for input(s): INR, APTT in the last 8760 hours.  BMP: Recent Labs    05/24/24 1837 05/25/24 0409 05/26/24 0349 06/25/24 1227 07/02/24 0932  NA 141 140 143 141 140  K 4.9 5.2* 4.8 4.0 4.7  CL 105 104 105 102 102  CO2 28 28 30  33* 30  GLUCOSE 85 99 117* 90 93  BUN 33* 33* 31* 23 22  CALCIUM  9.6 9.4 9.0 9.7 9.8  CREATININE 1.83* 1.83* 1.88* 1.62* 1.78*  GFRNONAA 27* 27* 26*  --  28*    LIVER FUNCTION TESTS: Recent Labs    05/24/24 1837 05/25/24 0409 06/25/24 1227 07/02/24 0932  BILITOT 0.4 0.4 0.4 0.4  AST 37 32 13 26  ALT 48* 43 10 18  ALKPHOS 84 82 139* 158*  PROT 6.6 6.3* 6.6 6.6  ALBUMIN  3.8 3.7 3.7 3.8    TUMOR MARKERS: No results for input(s): AFPTM, CEA, CA199, CHROMGRNA in the last 8760 hours.  Assessment and Plan:  84 year old female with a history of a mechanical  fall with subsequent pelvic injury/fractures. She was treated conservatively but continued to complain of severe pain. An MRI of the lumbar spine 07/27/24 showed sacral insufficiency fractures.    Thank you for this interesting consult.  I greatly enjoyed meeting Sandra Craig and look forward to participating in their care.  A copy of this report was sent to the requesting provider on this date.  Ester Sides, MD Pager: [phone]    I spent a total of  40 Minutes   in face to face in clinical consultation, greater than 50% of which was counseling/coordinating care for sacral insufficiency fracture.  "

## 2024-08-02 ENCOUNTER — Telehealth: Payer: Self-pay | Admitting: Family Medicine

## 2024-08-02 ENCOUNTER — Inpatient Hospital Stay: Admission: RE | Admit: 2024-08-02 | Source: Ambulatory Visit

## 2024-08-02 NOTE — Telephone Encounter (Signed)
 Copied from CRM [id number]. Topic: General - Other >> Aug 02, 2024 11:43 AM Robinson H wrote: Reason for CRM: Waiting on ortho doctor to release patient to resume therapy since it was on hold, didn't get a chance to see patient this week  [facility] [phone]

## 2024-08-05 ENCOUNTER — Other Ambulatory Visit: Payer: Self-pay | Admitting: Family Medicine

## 2024-08-06 ENCOUNTER — Ambulatory Visit: Admitting: Psychology

## 2024-08-06 DIAGNOSIS — F331 Major depressive disorder, recurrent, moderate: Secondary | ICD-10-CM | POA: Diagnosis not present

## 2024-08-06 NOTE — Progress Notes (Signed)
 "  [facility] Counselor/Therapist Progress Note  Patient ID: NATASCHA EDMONDS, MRN: [id number],    Date: 08/06/2024  Time Spent: 2:00pm-2:45pm   45 minutes   Treatment Type: Individual Therapy  Reported Symptoms: stress  Mental Status Exam: Appearance:  Casual     Behavior: Appropriate  Motor: Normal  Speech/Language:  Normal Rate  Affect: Appropriate  Mood: normal  Thought process: normal  Thought content:   WNL  Sensory/Perceptual disturbances:   WNL  Orientation: oriented to person, place, time/date, and situation  Attention: Good  Concentration: Good  Memory: WNL  Fund of knowledge:  Good  Insight:   Good  Judgment:  Good  Impulse Control: Good   Risk Assessment: Danger to Self:  No Self-injurious Behavior: No Danger to Others: No Duty to Warn:no Physical Aggression / Violence:No  Access to Firearms a concern: No  Gang Involvement:No   Subjective: Pt present for face-to-face individual therapy via video.  Pt consents to telehealth video session and is aware of limitations and benefits of virtual sessions.  Location of pt: home Location of therapist: home office.   Pt talked about her husband Charlie dying a few weeks ago.  Pt was tearful as she talked about the loss.  Helped pt process her feelings and grief.  Pt is lonely and scared at times at night.  Pt's dog is staying with [name]'s son Todd and pt misses her dog.  Pt has to move out of [name]'s house in a few months so she is going to have to figure out where to live.  [name]'s sons are going to sell the house.   Pt has to think about where she will go.  She is looking at [facility] and [employer].   Pt talked about her health.  She broke her pelvis and is in a wheelchair.   She is getting PT and OT at her home.  Pt is living at [employer] and is by herself most of the time.   [name]'s sons are being a good support.  The family is helping pt with meals and transport to doctors  appointments.  Pt has reached out to her friends and family for support as well.   Worked on self care strategies. Provided supportive therapy.    Interventions: Cognitive Behavioral Therapy and Insight-Oriented  Diagnosis: F33.1   Plan of Care: Recommend ongoing therapy.   Pt participated in setting treatment goals.  Pt wants to have someone to talk to and to improve coping skills.  Pt wants to feel less anxious and depressed.  Plan to meet monthly.    Pt agrees with treatment plan.     Treatment Plan (Treatment Plan Target Date: 11/08/2024) Client Abilities/Strengths  Pt is bright, engaging, and motivated for therapy.   Client Treatment Preferences  Individual therapy.  Client Statement of Needs  Improve coping skills.  Symptoms  Depressed or irritable mood. Diminished interest in or enjoyment of activities. Lack of energy. Feelings of hopelessness, worthlessness, or inappropriate guilt. Unresolved grief issues.  Problems Addressed  Unipolar Depression Goals 1. Alleviate depressive symptoms and return to previous level of effective functioning. 2. Appropriately grieve the loss in order to normalize mood and to return to previously adaptive level of functioning. Objective Learn and implement behavioral strategies to overcome depression. Target Date: 2024-11-08 Frequency: Monthly  Progress: 40 Modality: individual  Related Interventions Engage the client in behavioral activation, increasing his/her activity level and contact with sources of reward, while identifying processes that  inhibit activation.  Use behavioral techniques such as instruction, rehearsal, role-playing, role reversal, as needed, to facilitate activity in the client's daily life; reinforce success. Assist the client in developing skills that increase the likelihood of deriving pleasure from behavioral activation (e.g., assertiveness skills, developing an exercise plan, less internal/more external focus,  increased social involvement); reinforce success. Objective Identify important people in life, past and present, and describe the quality, good and poor, of those relationships. Target Date: 2024-11-08 Frequency:Monthly  Progress: 40 Modality: individual  Related Interventions Conduct Interpersonal Therapy beginning with the assessment of the client's interpersonal inventory of important past and present relationships; develop a case formulation linking depression to grief, interpersonal role disputes, role transitions, and/or interpersonal deficits). Objective Learn and implement problem-solving and decision-making skills. Target Date: 2024-11-08 Frequency: Monthly  Progress: 40 Modality: individual  Related Interventions Conduct Problem-Solving Therapy using techniques such as psychoeducation, modeling, and role-playing to teach client problem-solving skills (i.e., defining a problem specifically, generating possible solutions, evaluating the pros and cons of each solution, selecting and implementing a plan of action, evaluating the efficacy of the plan, accepting or revising the plan); role-play application of the problem-solving skill to a real life issue. Encourage in the client the development of a positive problem orientation in which problems and solving them are viewed as a natural part of life and not something to be feared, despaired, or avoided. 3. Develop healthy interpersonal relationships that lead to the alleviation and help prevent the relapse of depression. 4. Develop healthy thinking patterns and beliefs about self, others, and the world that lead to the alleviation and help prevent the relapse of depression. 5. Recognize, accept, and cope with feelings of depression. Diagnosis F33.1  Conditions For Discharge Achievement of treatment goals and objectives   [name], LCSW   "

## 2024-08-09 ENCOUNTER — Ambulatory Visit: Admitting: Family Medicine

## 2024-08-15 ENCOUNTER — Telehealth: Payer: Self-pay | Admitting: *Deleted

## 2024-08-15 NOTE — Telephone Encounter (Signed)
 Patient calling office to discuss Evenity  injections again. States she broke her pelvis in December on both sides and is interested in starting the Evenity  injections. Insurance still [employer] Patient states she will see Dr. Josefina at [facility] on 08/28/24 for more imaging. RN advised would review with Dr. Glennon and return call with recommendations. Patient agreeable.   Routing to provider to review and advise. Okay to send to [facility] for benefits verification of Evenity ?

## 2024-08-19 ENCOUNTER — Telehealth: Payer: Self-pay | Admitting: Family Medicine

## 2024-08-19 NOTE — Telephone Encounter (Signed)
 Left vm for Lauren regarding okay to move forward with orders.    Copied from CRM [id number]. Topic: Clinical - Home Health Verbal Orders >> Aug 19, 2024  1:26 PM Alfonso HERO wrote: Caller/Agency: [facility] Callback Number: [phone] Service Requested: Home Health Aid and Occupational Therapy Frequency: Once a week for 5 weeks Any new concerns about the patient? No

## 2024-08-20 ENCOUNTER — Other Ambulatory Visit: Payer: Self-pay | Admitting: *Deleted

## 2024-08-20 DIAGNOSIS — M8000XA Age-related osteoporosis with current pathological fracture, unspecified site, initial encounter for fracture: Secondary | ICD-10-CM

## 2024-08-20 MED ORDER — ROMOSOZUMAB-AQQG 105 MG/1.17ML ~~LOC~~ SOSY: 210.0000 mg | PREFILLED_SYRINGE | Freq: Once | SUBCUTANEOUS | Status: AC

## 2024-08-20 NOTE — Telephone Encounter (Signed)
 CAM order placed for Evenity . Will await benefits verification from [facility].   Encounter closed.

## 2024-08-21 ENCOUNTER — Other Ambulatory Visit (HOSPITAL_COMMUNITY): Payer: Self-pay

## 2024-08-21 ENCOUNTER — Telehealth: Payer: Self-pay

## 2024-08-21 NOTE — Telephone Encounter (Signed)
 Evenity  VOB initiated via [email or web address]  Last OV:  Next OV:  Last Evenity  inj:  Next Evenity  inj DUE: NEW START

## 2024-08-23 ENCOUNTER — Other Ambulatory Visit (HOSPITAL_COMMUNITY): Payer: Self-pay

## 2024-08-23 NOTE — Telephone Encounter (Signed)
 Sandra Craig

## 2024-08-23 NOTE — Telephone Encounter (Signed)
 PHARMACY PA SUBMITTED VIA LATENT. KEY: [id number]   MEDICAL PA SUBMITTED VIA BLUE-E.

## 2024-08-26 NOTE — Telephone Encounter (Signed)
 PHARMACY PA   DENIED -

## 2024-09-03 ENCOUNTER — Ambulatory Visit: Admitting: Psychology

## 2024-09-03 ENCOUNTER — Ambulatory Visit: Admitting: Family Medicine

## 2024-09-03 ENCOUNTER — Telehealth: Payer: Self-pay | Admitting: Family Medicine

## 2024-09-03 DIAGNOSIS — F331 Major depressive disorder, recurrent, moderate: Secondary | ICD-10-CM

## 2024-09-03 NOTE — Telephone Encounter (Signed)
 Patient scheduled to see Dr. Katrinka 09/06/2024. We will address this during appt.   Copied from CRM [id number]. Topic: Clinical - Request for Lab/Test Order >> Sep 03, 2024  1:42 PM Deleta RAMAN wrote: Reason for CRM: patient requesting xray

## 2024-09-05 ENCOUNTER — Other Ambulatory Visit (HOSPITAL_COMMUNITY): Payer: Self-pay

## 2024-09-05 NOTE — Telephone Encounter (Signed)
 MEDICAL PA CANCELLED. RESUBMITTED VIA BLUE-E

## 2024-09-06 ENCOUNTER — Ambulatory Visit: Payer: Self-pay | Admitting: Family Medicine

## 2024-09-06 ENCOUNTER — Ambulatory Visit: Admitting: Family Medicine

## 2024-09-06 ENCOUNTER — Encounter: Payer: Self-pay | Admitting: Family Medicine

## 2024-09-06 VITALS — BP 138/86 | HR 73 | Temp 98.2°F | Ht 65.0 in | Wt 131.0 lb

## 2024-09-06 DIAGNOSIS — J479 Bronchiectasis, uncomplicated: Secondary | ICD-10-CM | POA: Insufficient documentation

## 2024-09-06 DIAGNOSIS — E785 Hyperlipidemia, unspecified: Secondary | ICD-10-CM

## 2024-09-06 DIAGNOSIS — I10 Essential (primary) hypertension: Secondary | ICD-10-CM

## 2024-09-06 DIAGNOSIS — N1832 Chronic kidney disease, stage 3b: Secondary | ICD-10-CM

## 2024-09-06 DIAGNOSIS — R739 Hyperglycemia, unspecified: Secondary | ICD-10-CM

## 2024-09-06 DIAGNOSIS — Z131 Encounter for screening for diabetes mellitus: Secondary | ICD-10-CM

## 2024-09-06 LAB — CBC WITH DIFFERENTIAL/PLATELET
Basophils Absolute: 0 10*3/uL (ref 0.0–0.1)
Basophils Relative: 0.6 % (ref 0.0–3.0)
Eosinophils Absolute: 0 10*3/uL (ref 0.0–0.7)
Eosinophils Relative: 0.6 % (ref 0.0–5.0)
HCT: 36.5 % (ref 36.0–46.0)
Hemoglobin: 11.9 g/dL — ABNORMAL LOW (ref 12.0–15.0)
Lymphocytes Relative: 20 % (ref 12.0–46.0)
Lymphs Abs: 1 10*3/uL (ref 0.7–4.0)
MCHC: 32.7 g/dL (ref 30.0–36.0)
MCV: 91.4 fl (ref 78.0–100.0)
Monocytes Absolute: 0.3 10*3/uL (ref 0.1–1.0)
Monocytes Relative: 6.2 % (ref 3.0–12.0)
Neutro Abs: 3.5 10*3/uL (ref 1.4–7.7)
Neutrophils Relative %: 72.6 % (ref 43.0–77.0)
Platelets: 218 10*3/uL (ref 150.0–400.0)
RBC: 3.99 Mil/uL (ref 3.87–5.11)
RDW: 16.7 % — ABNORMAL HIGH (ref 11.5–15.5)
WBC: 4.8 10*3/uL (ref 4.0–10.5)

## 2024-09-06 LAB — COMPREHENSIVE METABOLIC PANEL WITH GFR
ALT: 30 U/L (ref 3–35)
AST: 28 U/L (ref 5–37)
Albumin: 4.1 g/dL (ref 3.5–5.2)
Alkaline Phosphatase: 130 U/L — ABNORMAL HIGH (ref 39–117)
BUN: 23 mg/dL (ref 6–23)
CO2: 29 meq/L (ref 19–32)
Calcium: 9.9 mg/dL (ref 8.4–10.5)
Chloride: 105 meq/L (ref 96–112)
Creatinine, Ser: 1.58 mg/dL — ABNORMAL HIGH (ref 0.40–1.20)
GFR: 30.34 mL/min — ABNORMAL LOW
Glucose, Bld: 84 mg/dL (ref 70–99)
Potassium: 4 meq/L (ref 3.5–5.1)
Sodium: 142 meq/L (ref 135–145)
Total Bilirubin: 0.4 mg/dL (ref 0.2–1.2)
Total Protein: 7.1 g/dL (ref 6.0–8.3)

## 2024-09-06 LAB — LDL CHOLESTEROL, DIRECT: Direct LDL: 72 mg/dL

## 2024-09-06 LAB — HEMOGLOBIN A1C: Hgb A1c MFr Bld: 5.6 % (ref 4.6–6.5)

## 2024-09-06 LAB — TSH: TSH: 1.7 u[IU]/mL (ref 0.35–5.50)

## 2024-09-06 NOTE — Progress Notes (Signed)
 " Phone [phone] In person visit   Subjective:   Sandra Craig is a 84 y.o. year old very pleasant female patient who presents for/with See problem oriented charting Chief Complaint  Patient presents with   Follow-up    2 month follow-up for fracture. Doing well. Is concerned about weight loss.     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    Priority: High   Chronic low back pain 12/20/2013    Priority: High   Fibromyalgia 12/05/2007    Priority: High   UTI (urinary tract infection) 12/15/2018    Priority: Medium    Chronic kidney disease, stage 3b (HCC) 06/02/2017    Priority: Medium    Near syncope 03/11/2017    Priority: Medium    Hyperglycemia 06/27/2012    Priority: Medium    History of small bowel obstruction 06/28/2011    Priority: Medium    GERD with stricture 06/28/2011    Priority: Medium    Hypertension     Priority: Medium    Hyperlipidemia     Priority: Medium    INSOMNIA, CHRONIC 10/08/2008    Priority: Medium    Depression 02/08/2008    Priority: Medium    Facet arthropathy 09/14/2019    Priority: Low   Vaginal dryness 01/29/2019    Priority: Low   Chronic right shoulder pain 03/07/2014    Priority: Low   Benign essential tremor 10/09/2013    Priority: Low   Diverticulitis large intestine 02/11/2012    Priority: Low   Irritable bowel syndrome (IBS) 06/28/2011    Priority: Low   IC (interstitial cystitis)     Priority: Low   Hemorrhoids 10/08/2008    Priority: Low   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     aspirin  81 MG tablet Take 1 tablet (81 mg total) by mouth daily. 30 tablet    atorvastatin  (LIPITOR) 40 MG tablet TAKE 1 TABLET(40 MG) BY MOUTH DAILY (Patient taking differently: Take 40 mg by mouth at bedtime.) 90 tablet 3   Calcium  Carb-Cholecalciferol (CALCIUM  + VITAMIN D3 PO) Take 1 tablet by mouth 2 (two) times daily.     DULoxetine  (CYMBALTA ) 30 MG capsule TAKE 2 CAPSULE BY MOUTH EVERY MORNING AND 1 CAPSULE BY MOUTH EVERY EVENING 270 capsule 3   famotidine  (PEPCID ) 20 MG tablet Take 1 tablet (20 mg total) by mouth 2 (two) times daily. 180 tablet 1   gabapentin  (NEURONTIN ) 300 MG capsule TAKE 1 CAPSULE(300 MG) BY MOUTH TWICE DAILY AS NEEDED FOR PAIN 180 capsule 3   losartan  (COZAAR ) 25 MG tablet Take 12.5 mg by mouth daily.     Multiple Vitamins-Minerals (MULTIVITAMIN WOMEN 50+) TABS Take 1 tablet by mouth 2 (two) times  daily.     Polyethyl Glycol-Propyl Glycol (SYSTANE OP) Place 1 drop into both eyes 2 (two) times daily as needed (eye irritation).     sucralfate  (CARAFATE ) 1 g tablet Take 1 tablet (1 g total) by mouth 4 (four) times daily. Dissolve each tablet in 15 cc water  before use. (Patient taking differently: Take 1 g by mouth 4 (four) times daily as needed (somach irritation).) 120 tablet 2   zolpidem  (AMBIEN ) 5 MG tablet TAKE 1 TABLET BY MOUTH EVERY DAY AT BEDTIME FOR SLEEP 90 tablet 1   methylPREDNISolone  (MEDROL  DOSEPAK) 4 MG TBPK tablet Take as prescribed on the box (Patient not taking: Reported on 09/06/2024) 21 tablet 0   Current Facility-Administered Medications  Medication Dose Route Frequency Provider Last Rate Last Admin   Romosozumab -aqqg (EVENITY )  105 MG/1. injection 210 mg  210 mg Subcutaneous Once        Romosozumab -aqqg (EVENITY ) 105 MG/1. injection 210 mg  210 mg Subcutaneous Once Glennon Almarie POUR, MD         Objective:  BP 138/86 (BP Location: Left Arm, Patient Position: Sitting, Cuff Size: Normal)   Pulse 73   Temp 98.2 F (36.8 C) (Temporal)   Ht 5' 5 (1.651 m)   Wt 131 lb (59.4 kg)   LMP 08/25/1992   SpO2 95%   BMI 21.80 kg/m  Gen: NAD, resting comfortably CV: RRR no murmurs rubs or gallops Lungs: CTAB no crackles, wheeze, rhonchi Ext: no edema Skin: warm, dry     Assessment and Plan   #social update- after healing she is going to start to look for new place to live- family of now deceased husband was supportive but prenup did say she had to move.   # Closed fracture of left pubis S: Patient saw Dr. Georgina after our visit but continued to have ongoing pain-they thought it would be an ongoing slow process for healing -At December 11 visit he recommended another visit in 3 weeks with repeat x-rays at next visit: AP/inlet/outlet pelvis  -plugged into physical therapy again after enough healing to allow her to tolerate sessions- was paused short term by her and Dr. Georgina - reports about to graduate!  -she decided to transition care to Dr. Josefina and social work him a few weeks ago with plan for another 6 week visit- healing reasonably well at that visit A/P: she is healing well and congratulated her on progress  - she is planning to restart the Even[employer]  to avoid further fractures- I think reasonable at this point -also found out about fracture in right sacrum on MRI but thankfully that pain is better and no further follow up needed  # Weight loss/stress S: She is down 4 pounds from last visit with me. 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 Ona [facility] with most recent visit 07/05/24. These 2 conditions are stable-Advised regular flutter valve use-she's been consistent- symptom(s) well controlled. No other active treatments   # Depression/insomnia- works with Veva Bauert  #fibromyalgia- also on gabapentin . Tylenol  otherwise S: Medication: Cymbalta  30 mg twice daily, Ambien  5 mg causes vivid dreams at times but still takes.  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 Has done a great job over 05 years     09/06/2024    1:11 PM 06/25/2024   11:10 AM 05/15/2024   10:58 AM  Depression screen PHQ 2/9  Decreased Interest 0 0 0  Down, Depressed, Hopeless 0 0 0  PHQ - 2 Score 0 0 0  Altered sleeping 0 0 0  Tired, decreased energy 0 0 0  Change in appetite 2 0 0  Feeling bad or failure about yourself  0 0 0  Trouble concentrating 0 0 0  Moving slowly or fidgety/restless 0 0 0  Suicidal thoughts 0 0 0  PHQ-9 Score 2 0 0   Difficult doing work/chores Not difficult at all Not difficult at all Not difficult at all     Data saved with a previous flowsheet row definition  A/P:  full remission- continue current medications     #hypertension with orthostatic hypotension S: medication: Losartan  12.5 mg restarted by Dr. Gregory presyncope but was having elevated microalbumin to creatinine ratio compared to prior BP Readings from Last 3  Encounters:  09/06/24 138/86  07/10/24 132/76  07/01/24 136/81  A/P: high acceptable on one hand on other hand with her presyncope history we want to be very cautious and give her more space on bp  #Chronic kidney disease stage III and on border with 4- sees Dr. 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 S: Medication:atorvastatin  40 mg-likely need to increase dose -in the past Rosuvastatin  40 mg ( change with creatinine clearance around 30-changed to atorvastatin ) Lab Results  Component Value Date   CHOL 183 01/05/2024   HDL 64.60 01/05/2024   LDLCALC 87 01/05/2024   LDLDIRECT 59.0 11/03/2020   TRIG 156.0 (H) 01/05/2024   CHOLHDL 3 01/05/2024   A/P: lipids above ideal goal for TIA and basal ganglia infarct history- check LDL today and if not improved consider atorvastatin  80 mg   # Hyperglycemia/insulin resistance/prediabetes-A1c as high as 5.06 February 2021 S:  Medication: none Lab Results  Component Value Date   HGBA1C 5.8 01/05/2024   HGBA1C 6.0 11/30/2022   HGBA1C 6.1 06/22/2022    A/P: a1c has improved recently but due for 6 month check- continue current medications   Recommended follow up: Return in about 6 months (around 03/06/2025) for physical or sooner if needed.Schedule b4 you leave. Future Appointments  Date Time Provider Department Center  10/01/2024  2:00 PM Bauert, Veva ORN, [place] [facility] None  10/29/2024  2:00 PM Bauert, Veva ORN, LCSW [facility] None  01/08/2025  2:00 PM [facility] None  01/08/2025  2:30 PM [facility] 1 [facility] [place]  01/14/2025  1:40 PM [facility] ANNUAL WELLNESS VISIT 1 [facility] [place]  01/16/2025  2:00  PM Sherrod Sherrod, MD [facility] [place]  None    Lab/Order associations:   ICD-10-CM   1. Primary hypertension  I10 CBC with Differential/Platelet    Comprehensive metabolic panel with GFR    2. Hyperlipidemia, unspecified hyperlipidemia type  E78.5 CBC with Differential/Platelet    Comprehensive metabolic panel with GFR    TSH    LDL cholesterol, direct    3. Hyperglycemia  R73.9 Hemoglobin A1c    4. Screening for diabetes mellitus  Z13.1 Hemoglobin A1c      No orders of the defined types were placed in this encounter.   Return precautions advised.  Garnette Lukes, MD  "

## 2024-09-06 NOTE — Patient Instructions (Addendum)
 Please stop by lab before you go If you have mychart- we will send your results within 3 business days of us  receiving them.  If you do not have mychart- we will call you about results within 5 business days of us  receiving them.  *please also note that you will see labs on mychart as soon as they post. I will later go in and write notes on them- will say notes from Dr. Katrinka   No changes today unless labs lead us  to make changes   Recommended follow up: Return in about 6 months (around 03/06/2025) for physical or sooner if needed.Schedule b4 you leave.

## 2024-10-01 ENCOUNTER — Ambulatory Visit: Admitting: Psychology

## 2024-10-29 ENCOUNTER — Ambulatory Visit: Admitting: Psychology

## 2025-01-08 ENCOUNTER — Other Ambulatory Visit (HOSPITAL_COMMUNITY)

## 2025-01-08 ENCOUNTER — Other Ambulatory Visit

## 2025-01-08 ENCOUNTER — Inpatient Hospital Stay

## 2025-01-14 ENCOUNTER — Ambulatory Visit

## 2025-01-16 ENCOUNTER — Inpatient Hospital Stay: Admitting: Internal Medicine

## 2025-03-07 ENCOUNTER — Encounter: Admitting: Family Medicine
